# Patient Record
Sex: Male | Born: 1955 | Race: Black or African American | Hispanic: No | Marital: Single | Smoking: Current every day smoker
Health system: Southern US, Community
[De-identification: ages and names within clinical notes are randomized; demographics above are authoritative.]

## PROBLEM LIST (undated history)

## (undated) DIAGNOSIS — F259 Schizoaffective disorder, unspecified: Secondary | ICD-10-CM

## (undated) DIAGNOSIS — I10 Essential (primary) hypertension: Secondary | ICD-10-CM

## (undated) DIAGNOSIS — F419 Anxiety disorder, unspecified: Secondary | ICD-10-CM

## (undated) DIAGNOSIS — K5641 Fecal impaction: Secondary | ICD-10-CM

## (undated) DIAGNOSIS — F250 Schizoaffective disorder, bipolar type: Secondary | ICD-10-CM

## (undated) DIAGNOSIS — J189 Pneumonia, unspecified organism: Secondary | ICD-10-CM

## (undated) DIAGNOSIS — K5900 Constipation, unspecified: Secondary | ICD-10-CM

## (undated) DIAGNOSIS — J449 Chronic obstructive pulmonary disease, unspecified: Secondary | ICD-10-CM

---

## 1997-05-24 ENCOUNTER — Other Ambulatory Visit: Admission: RE | Admit: 1997-05-24 | Discharge: 1997-05-24 | Payer: Self-pay

## 2002-02-07 ENCOUNTER — Encounter: Payer: Self-pay | Admitting: Emergency Medicine

## 2002-02-07 ENCOUNTER — Inpatient Hospital Stay (HOSPITAL_COMMUNITY): Admission: EM | Admit: 2002-02-07 | Discharge: 2002-02-09 | Payer: Self-pay | Admitting: Emergency Medicine

## 2002-02-08 ENCOUNTER — Encounter: Payer: Self-pay | Admitting: General Surgery

## 2002-02-09 ENCOUNTER — Encounter: Payer: Self-pay | Admitting: General Surgery

## 2003-07-20 ENCOUNTER — Emergency Department (HOSPITAL_COMMUNITY): Admission: EM | Admit: 2003-07-20 | Discharge: 2003-07-20 | Payer: Self-pay | Admitting: Emergency Medicine

## 2003-07-25 ENCOUNTER — Emergency Department (HOSPITAL_COMMUNITY): Admission: EM | Admit: 2003-07-25 | Discharge: 2003-07-25 | Payer: Self-pay | Admitting: Emergency Medicine

## 2003-07-31 ENCOUNTER — Emergency Department (HOSPITAL_COMMUNITY): Admission: EM | Admit: 2003-07-31 | Discharge: 2003-07-31 | Payer: Self-pay | Admitting: Emergency Medicine

## 2003-08-02 ENCOUNTER — Emergency Department (HOSPITAL_COMMUNITY): Admission: EM | Admit: 2003-08-02 | Discharge: 2003-08-02 | Payer: Self-pay | Admitting: Emergency Medicine

## 2003-08-07 ENCOUNTER — Emergency Department (HOSPITAL_COMMUNITY): Admission: EM | Admit: 2003-08-07 | Discharge: 2003-08-07 | Payer: Self-pay | Admitting: Emergency Medicine

## 2003-09-12 ENCOUNTER — Emergency Department (HOSPITAL_COMMUNITY): Admission: EM | Admit: 2003-09-12 | Discharge: 2003-09-12 | Payer: Self-pay | Admitting: Emergency Medicine

## 2003-09-23 ENCOUNTER — Emergency Department (HOSPITAL_COMMUNITY): Admission: EM | Admit: 2003-09-23 | Discharge: 2003-09-23 | Payer: Self-pay | Admitting: Emergency Medicine

## 2003-11-08 ENCOUNTER — Emergency Department (HOSPITAL_COMMUNITY): Admission: EM | Admit: 2003-11-08 | Discharge: 2003-11-08 | Payer: Self-pay | Admitting: Emergency Medicine

## 2004-01-20 ENCOUNTER — Emergency Department (HOSPITAL_COMMUNITY): Admission: EM | Admit: 2004-01-20 | Discharge: 2004-01-21 | Payer: Self-pay | Admitting: Emergency Medicine

## 2004-01-30 ENCOUNTER — Ambulatory Visit: Payer: Self-pay | Admitting: Family Medicine

## 2004-01-30 ENCOUNTER — Inpatient Hospital Stay (HOSPITAL_COMMUNITY): Admission: EM | Admit: 2004-01-30 | Discharge: 2004-02-06 | Payer: Self-pay | Admitting: Emergency Medicine

## 2004-11-20 ENCOUNTER — Emergency Department (HOSPITAL_COMMUNITY): Admission: EM | Admit: 2004-11-20 | Discharge: 2004-11-21 | Payer: Self-pay | Admitting: Emergency Medicine

## 2005-03-19 ENCOUNTER — Ambulatory Visit: Payer: Self-pay | Admitting: Internal Medicine

## 2005-03-19 ENCOUNTER — Inpatient Hospital Stay (HOSPITAL_COMMUNITY): Admission: EM | Admit: 2005-03-19 | Discharge: 2005-03-23 | Payer: Self-pay | Admitting: Emergency Medicine

## 2005-04-04 ENCOUNTER — Emergency Department (HOSPITAL_COMMUNITY): Admission: EM | Admit: 2005-04-04 | Discharge: 2005-04-05 | Payer: Self-pay | Admitting: Emergency Medicine

## 2005-04-05 ENCOUNTER — Emergency Department (HOSPITAL_COMMUNITY): Admission: EM | Admit: 2005-04-05 | Discharge: 2005-04-05 | Payer: Self-pay | Admitting: Emergency Medicine

## 2005-05-13 ENCOUNTER — Encounter: Payer: Self-pay | Admitting: Emergency Medicine

## 2005-05-13 ENCOUNTER — Inpatient Hospital Stay (HOSPITAL_COMMUNITY): Admission: EM | Admit: 2005-05-13 | Discharge: 2005-05-21 | Payer: Self-pay | Admitting: Psychiatry

## 2005-05-14 ENCOUNTER — Ambulatory Visit: Payer: Self-pay | Admitting: Psychiatry

## 2005-09-16 ENCOUNTER — Emergency Department (HOSPITAL_COMMUNITY): Admission: EM | Admit: 2005-09-16 | Discharge: 2005-09-16 | Payer: Self-pay | Admitting: Emergency Medicine

## 2005-10-16 ENCOUNTER — Emergency Department (HOSPITAL_COMMUNITY): Admission: EM | Admit: 2005-10-16 | Discharge: 2005-10-16 | Payer: Self-pay | Admitting: *Deleted

## 2007-04-09 ENCOUNTER — Emergency Department (HOSPITAL_COMMUNITY): Admission: EM | Admit: 2007-04-09 | Discharge: 2007-04-09 | Payer: Self-pay | Admitting: Emergency Medicine

## 2007-11-20 ENCOUNTER — Emergency Department (HOSPITAL_COMMUNITY): Admission: EM | Admit: 2007-11-20 | Discharge: 2007-11-21 | Payer: Self-pay | Admitting: Emergency Medicine

## 2008-09-23 ENCOUNTER — Emergency Department (HOSPITAL_COMMUNITY): Admission: EM | Admit: 2008-09-23 | Discharge: 2008-09-23 | Payer: Self-pay | Admitting: Emergency Medicine

## 2009-04-18 ENCOUNTER — Emergency Department (HOSPITAL_COMMUNITY): Admission: EM | Admit: 2009-04-18 | Discharge: 2009-04-18 | Payer: Self-pay | Admitting: Emergency Medicine

## 2009-05-23 DEATH — deceased

## 2009-06-09 ENCOUNTER — Inpatient Hospital Stay (HOSPITAL_COMMUNITY): Admission: EM | Admit: 2009-06-09 | Discharge: 2009-06-13 | Payer: Self-pay | Admitting: Emergency Medicine

## 2009-06-11 ENCOUNTER — Ambulatory Visit: Payer: Self-pay | Admitting: Vascular Surgery

## 2009-06-11 ENCOUNTER — Encounter (INDEPENDENT_AMBULATORY_CARE_PROVIDER_SITE_OTHER): Payer: Self-pay | Admitting: Internal Medicine

## 2009-06-11 ENCOUNTER — Ambulatory Visit: Payer: Self-pay | Admitting: Cardiology

## 2010-02-08 ENCOUNTER — Emergency Department (HOSPITAL_COMMUNITY)
Admission: EM | Admit: 2010-02-08 | Discharge: 2010-02-08 | Payer: Self-pay | Source: Home / Self Care | Admitting: Emergency Medicine

## 2010-05-04 LAB — GLUCOSE, CAPILLARY: Glucose-Capillary: 137 mg/dL — ABNORMAL HIGH (ref 70–99)

## 2010-05-12 ENCOUNTER — Emergency Department (HOSPITAL_COMMUNITY)
Admission: EM | Admit: 2010-05-12 | Discharge: 2010-05-13 | Disposition: A | Discharge status: Home / Self Care | Payer: Medicaid Other | Attending: Emergency Medicine | Admitting: Emergency Medicine

## 2010-05-12 ENCOUNTER — Emergency Department (HOSPITAL_COMMUNITY): Payer: Medicaid Other

## 2010-05-12 DIAGNOSIS — R059 Cough, unspecified: Secondary | ICD-10-CM | POA: Insufficient documentation

## 2010-05-12 DIAGNOSIS — E119 Type 2 diabetes mellitus without complications: Secondary | ICD-10-CM | POA: Insufficient documentation

## 2010-05-12 DIAGNOSIS — J449 Chronic obstructive pulmonary disease, unspecified: Secondary | ICD-10-CM | POA: Insufficient documentation

## 2010-05-12 DIAGNOSIS — J029 Acute pharyngitis, unspecified: Secondary | ICD-10-CM | POA: Insufficient documentation

## 2010-05-12 DIAGNOSIS — I10 Essential (primary) hypertension: Secondary | ICD-10-CM | POA: Insufficient documentation

## 2010-05-12 DIAGNOSIS — J40 Bronchitis, not specified as acute or chronic: Secondary | ICD-10-CM | POA: Insufficient documentation

## 2010-05-12 DIAGNOSIS — R05 Cough: Secondary | ICD-10-CM | POA: Insufficient documentation

## 2010-05-12 DIAGNOSIS — E785 Hyperlipidemia, unspecified: Secondary | ICD-10-CM | POA: Insufficient documentation

## 2010-05-12 DIAGNOSIS — J4489 Other specified chronic obstructive pulmonary disease: Secondary | ICD-10-CM | POA: Insufficient documentation

## 2010-05-12 LAB — GLUCOSE, CAPILLARY
Glucose-Capillary: 129 mg/dL — ABNORMAL HIGH (ref 70–99)
Glucose-Capillary: 131 mg/dL — ABNORMAL HIGH (ref 70–99)
Glucose-Capillary: 132 mg/dL — ABNORMAL HIGH (ref 70–99)
Glucose-Capillary: 163 mg/dL — ABNORMAL HIGH (ref 70–99)
Glucose-Capillary: 181 mg/dL — ABNORMAL HIGH (ref 70–99)
Glucose-Capillary: 85 mg/dL (ref 70–99)
Glucose-Capillary: 93 mg/dL (ref 70–99)
Glucose-Capillary: 98 mg/dL (ref 70–99)

## 2010-05-12 LAB — BASIC METABOLIC PANEL
BUN: 8 mg/dL (ref 6–23)
CO2: 28 mEq/L (ref 19–32)
CO2: 31 mEq/L (ref 19–32)
Calcium: 8.9 mg/dL (ref 8.4–10.5)
Chloride: 97 mEq/L (ref 96–112)
Creatinine, Ser: 0.78 mg/dL (ref 0.4–1.5)
Creatinine, Ser: 0.8 mg/dL (ref 0.4–1.5)
GFR calc Af Amer: >60 mL/min (ref >60)
GFR calc Af Amer: >60 mL/min (ref >60)
GFR calc non Af Amer: >60 mL/min (ref >60)
GFR calc non Af Amer: >60 mL/min (ref >60)
Glucose, Bld: 123 mg/dL — ABNORMAL HIGH (ref 70–99)
Sodium: 135 mEq/L (ref 135–145)
Sodium: 139 mEq/L (ref 135–145)

## 2010-05-12 LAB — COMPREHENSIVE METABOLIC PANEL
AST: 21 U/L (ref 0–37)
CO2: 31 mEq/L (ref 19–32)
Calcium: 9 mg/dL (ref 8.4–10.5)
Chloride: 103 mEq/L (ref 96–112)
Creatinine, Ser: 0.65 mg/dL (ref 0.4–1.5)
GFR calc Af Amer: >60 mL/min (ref >60)
GFR calc non Af Amer: >60 mL/min (ref >60)
Glucose, Bld: 103 mg/dL — ABNORMAL HIGH (ref 70–99)
Total Bilirubin: 0.9 mg/dL (ref 0.3–1.2)

## 2010-05-12 LAB — DIFFERENTIAL
Basophils Absolute: 0 10*3/uL (ref 0.0–0.1)
Basophils Absolute: 0 10*3/uL (ref 0.0–0.1)
Basophils Relative: 0 % (ref 0–1)
Eosinophils Absolute: 0 10*3/uL (ref 0.0–0.7)
Lymphocytes Relative: 21 % (ref 12–46)
Lymphs Abs: 2.3 10*3/uL (ref 0.7–4.0)
Monocytes Absolute: 1.3 10*3/uL — ABNORMAL HIGH (ref 0.1–1.0)
Neutro Abs: 7.9 10*3/uL — ABNORMAL HIGH (ref 1.7–7.7)
Neutrophils Relative %: 72 % (ref 43–77)

## 2010-05-12 LAB — RETICULOCYTES
Retic Count, Absolute: 37.4 10*3/uL (ref 19.0–186.0)
Retic Ct Pct: 0.9 % (ref 0.4–3.1)

## 2010-05-12 LAB — CBC
HCT: 33.2 % — ABNORMAL LOW (ref 39.0–52.0)
HCT: 38.4 % — ABNORMAL LOW (ref 39.0–52.0)
Hemoglobin: 10.6 g/dL — ABNORMAL LOW (ref 13.0–17.0)
Hemoglobin: 12.5 g/dL — ABNORMAL LOW (ref 13.0–17.0)
MCHC: 31.9 g/dL (ref 30.0–36.0)
MCV: 86.5 fL (ref 78.0–100.0)
MCV: 86.8 fL (ref 78.0–100.0)
Platelets: 258 10*3/uL (ref 150–400)
RBC: 3.83 MIL/uL — ABNORMAL LOW (ref 4.22–5.81)
RBC: 4.05 MIL/uL — ABNORMAL LOW (ref 4.22–5.81)
RBC: 4.41 MIL/uL (ref 4.22–5.81)
RDW: 13.2 % (ref 11.5–15.5)
WBC: 11 10*3/uL — ABNORMAL HIGH (ref 4.0–10.5)
WBC: 11.2 10*3/uL — ABNORMAL HIGH (ref 4.0–10.5)
WBC: 11.4 10*3/uL — ABNORMAL HIGH (ref 4.0–10.5)

## 2010-05-12 LAB — URINALYSIS, ROUTINE W REFLEX MICROSCOPIC
Bilirubin Urine: NEGATIVE
Glucose, UA: NEGATIVE mg/dL
Hgb urine dipstick: NEGATIVE
Ketones, ur: 15 mg/dL — AB
Specific Gravity, Urine: 1.015 (ref 1.005–1.030)
pH: 6 (ref 5.0–8.0)

## 2010-05-12 LAB — HEMOGLOBIN A1C: Hgb A1c MFr Bld: 6 % — ABNORMAL HIGH (ref <5.7)

## 2010-05-12 LAB — URINE CULTURE: Special Requests: NEGATIVE

## 2010-05-12 LAB — BRAIN NATRIURETIC PEPTIDE: Pro B Natriuretic peptide (BNP): <30 pg/mL (ref 0.0–100.0)

## 2010-05-12 LAB — TSH: TSH: 0.464 u[IU]/mL (ref 0.350–4.500)

## 2010-05-12 LAB — IRON AND TIBC
Iron: 23 ug/dL — ABNORMAL LOW (ref 42–135)
TIBC: 216 ug/dL (ref 215–435)

## 2010-05-12 LAB — FOLATE: Folate: 14.6 ng/mL

## 2010-05-12 LAB — AMMONIA: Ammonia: 27 umol/L (ref 11–35)

## 2010-05-12 LAB — POCT CARDIAC MARKERS
CKMB, poc: <1 ng/mL — ABNORMAL LOW (ref 1.0–8.0)
Troponin i, poc: <0.05 ng/mL (ref 0.00–0.09)

## 2010-05-12 LAB — FERRITIN: Ferritin: 322 ng/mL (ref 22–322)

## 2010-05-12 LAB — VITAMIN B12: Vitamin B-12: 376 pg/mL (ref 211–911)

## 2010-05-13 LAB — URINALYSIS, ROUTINE W REFLEX MICROSCOPIC
Nitrite: NEGATIVE
Protein, ur: NEGATIVE mg/dL
Urobilinogen, UA: 1 mg/dL (ref 0.0–1.0)

## 2010-05-13 LAB — COMPREHENSIVE METABOLIC PANEL
Alkaline Phosphatase: 71 U/L (ref 39–117)
BUN: 16 mg/dL (ref 6–23)
Glucose, Bld: 134 mg/dL — ABNORMAL HIGH (ref 70–99)
Potassium: 3.7 mEq/L (ref 3.5–5.1)
Total Bilirubin: 0.4 mg/dL (ref 0.3–1.2)
Total Protein: 7.3 g/dL (ref 6.0–8.3)

## 2010-05-13 LAB — BASIC METABOLIC PANEL
CO2: 29 mEq/L (ref 19–32)
Chloride: 105 mEq/L (ref 96–112)
GFR calc non Af Amer: >60 mL/min (ref >60)
Glucose, Bld: 108 mg/dL — ABNORMAL HIGH (ref 70–99)
Potassium: 3.3 mEq/L — ABNORMAL LOW (ref 3.5–5.1)
Sodium: 142 mEq/L (ref 135–145)

## 2010-05-13 LAB — URINE CULTURE

## 2010-05-13 LAB — DIFFERENTIAL
Basophils Absolute: 0 10*3/uL (ref 0.0–0.1)
Basophils Relative: 0 % (ref 0–1)
Neutro Abs: 7.6 10*3/uL (ref 1.7–7.7)
Neutrophils Relative %: 79 % — ABNORMAL HIGH (ref 43–77)

## 2010-05-13 LAB — CBC
HCT: 38.6 % — ABNORMAL LOW (ref 39.0–52.0)
Hemoglobin: 12.6 g/dL — ABNORMAL LOW (ref 13.0–17.0)
MCV: 87.3 fL (ref 78.0–100.0)
RDW: 13.4 % (ref 11.5–15.5)

## 2010-05-30 LAB — DIFFERENTIAL
Basophils Absolute: 0 10*3/uL (ref 0.0–0.1)
Basophils Relative: 0 % (ref 0–1)
Eosinophils Relative: 0 % (ref 0–5)
Lymphocytes Relative: 27 % (ref 12–46)
Monocytes Absolute: 0.8 10*3/uL (ref 0.1–1.0)

## 2010-05-30 LAB — CBC
HCT: 41.8 % (ref 39.0–52.0)
Hemoglobin: 13.5 g/dL (ref 13.0–17.0)
MCHC: 32.3 g/dL (ref 30.0–36.0)
RDW: 13.9 % (ref 11.5–15.5)

## 2010-05-30 LAB — POCT I-STAT, CHEM 8
BUN: 19 mg/dL (ref 6–23)
Calcium, Ion: 1.18 mmol/L (ref 1.12–1.32)
TCO2: 26 mmol/L (ref 0–100)

## 2010-07-10 NOTE — Discharge Summary (Signed)
Craig Bentley, Craig Bentley NO.:  [id number]   MEDICAL RECORD NO.:  [id number]          PATIENT TYPE:  INP   LOCATION:  5743                         FACILITY:  [facility]   PHYSICIAN:  Duncan Dull, M.D.     DATE OF BIRTH:  08-16-1955   DATE OF PROCEDURE:  DATE OF DISCHARGE:  03/24/2005                      STAT - MUST CHANGE TO CORRECT WORK TYPE   DISCHARGE DIAGNOSES:  1.  Ileus/fecal impaction.  2.  Bilateral patchy upper lobe airspace disease and right lower lobe      consolidation.  3.  Acute renal failure.  4.  Hypotension.  5.  Urinary retention.  6.  Hypomagnesemia.  7.  Schizophrenia.   DISCHARGE MEDICATIONS:  1.  Metformin 500 mg p.o. q.12h.  2.  Clozapine 100 mg p.o. q.a.m., clozapine 200 mg p.o. at noon, clozapine      400 mg p.o. q.h.s.  3.  Allegra 180 mg one tablet p.o. daily.  4.  Mobic 15 mg one tablet p.o. daily.  5.  Lactulose 15 mg p.o. daily.  6.  Aspirin 325 mg p.o. daily.  7.  Niaspan 1500 mg p.o. q.h.s.  8.  Albuterol MDI q.6h. p.r.n.  9.  Benztropine 2 mg p.o. q.12h.  10. Spiriva 18 mcg one capsule inhaled daily.  11. Zelnorm 6 mg p.o. b.i.d. before meals.  12. Magnesium oxide 400 mg p.o. t.i.d.  13. Avelox 400 mg one tablet p.o. daily, with the last dose to be given on      March 28, 2005.  14. Haldol 5 mg p.o. q.8h. to be started on April 01, 2005.   CONDITION ON DISCHARGE:  The patient will be discharged to [facility].  He is much improved at the time of discharge, and his  fecal impaction has resolved.   FOLLOW UP:  He should follow up with Dr. Hortencia Pilar, [facility] at his previously scheduled appointment on April 01, 2005.  At this  time, some consideration should be given to altering his psychotropic  medications, as there was felt to be some contribution to his fecal  impaction as well as to his urinary retention by the anticholinergics  prescribed.  He should also be seen by the  physician that staffs [facility] within one week of the time of discharge.  Specific  attention should be paid to his bowel movements per day, his blood pressure,  his potassium and his magnesium, as these have all been issues during his  hospitalization.  He should also have a repeat chest x-ray in one month to  follow progression of his abnormal findings as detailed below.   PROCEDURES:  There were no invasive procedures.   STUDIES:  1.  Chest x-ray done on March 19, 2005 showed a previously demonstrated      right lung airspace opacity suspicious for pneumonia.  2.  A chest CT done on March 19, 2005 showed right lower lobe      collapse/consolidation with soft tissue density in the right infrahilar      region.  No  mass identified.  3.  A CT of the pelvis on March 19, 2005 showed marked abdominal      distension with very large volume stool in the abdominal segments of the      colon.  It also showed a small amount of free fluid at the tip of the      liver as well as a small volume of fluid in the peritoneal cavity, with      the colon markedly distended with stool.  4.  An abdominal plain film done on March 09, 2005 showed a large amount      of stool in the colon on the left in the sigmoid region.  It also showed      stable minimal chronic bronchitic changes as well as interval diffuse      right lung volume loss and associated postobstructive changes or      pneumonia.  5.  An abdominal film done on March 20, 2005 showed progressive      constipation with massive amount of fecal material present throughout      the colon.  6.  A chest CT with contrast done on March 21, 2005 showed bilateral      patchy upper lobe airspace disease as well as left basilar atelectasis      and slight improvement of his right lower lobe consolidation.  There      were also noted to be small bilateral effusions.  There is no      obstructing central mass  identified.  There is also a new 1.4-cm fluid      collection adjacent to the spleen as well as stable colonic distension.  7.  An abdominal plain film done on March 21, 2005 showed interval      decrease in volume of the retained stool in the colon.   CONSULTATIONS:  Psychiatry was consulted, Dr. Jeanie Sewer.   HISTORY OF PRESENT ILLNESS:  For full details, please see the full history  and physical on the chart.  In brief, Craig Bentley is a 55 year old African-  American gentleman with a history of schizophrenia, hypertension, and past  episodes of partial small-bowel obstruction.  He presents with a two-day  history of generalized abdominal pain associated with nausea and decreased  p.o. intake.  He vomited several times consisting of brown feculent  material.  He denies any hematochezia or hematemesis.  He does note that he  has been constipated for greater than one week but did have one single  watery bowel movement the day prior to admission.  He denies any fevers or  chills.  He also noted that he had a chronic cough productive of yellow  sputum and chronic dyspnea on exertion.  He also reports chronically that he  suffers from urinary hesitancy, poor stream, and postmicturition dribble.   PHYSICAL EXAMINATION:  VITAL SIGNS:  Temperature 97.2, blood pressure 56/34  which improved to 94/44 after 2 L of fluid.  His oxygen saturation was 95%  on 2 L.  HEENT:  Poor oral hygiene and feculent breath.  RESPIRATORY:  Significant for coarse breath sounds bilaterally with an  increased expiratory phase anteriorly.  He also had poor breath sounds  bibasilarly, with right greater than left.  ABDOMEN:  Soft, distended, and moderately tender diffusely.  He was guaiac-  positive.   LABORATORY DATA ON ADMISSION:  White blood cell count 13.1, hemoglobin 13.9,  hematocrit 41.5, platelets 217.  His basic metabolic panel was significant  for a creatinine of 1.6.  His urinalysis was negative for any  abnormal findings.  His amylase and lipase were normal.   HOSPITAL COURSE BY PROBLEM:  Problem 1.  Fecal impaction.  The patient came  in with a history of constipation and feculent emesis.  He was placed n.p.o.  and given several enemas.  He was followed by serial abdominal plain films,  and after several large bowel movements, some resolution of his constipation  was achieved.  His diet was advanced as tolerated.  He was started on  Zelnorm to see if this would yield some improvement his chronic  constipation.  His lactulose was resumed for its osmotic effect.  At the  time of discharge, he does report that his stools are soft but formed.  Colace may be restarted as needed for hard stools.  At the time of  discharge, he is tolerating a full diet and having one to two bowel  movements daily.  There was some question of whether the anticholinergic  effect of his psychotropic medication could be contributing to his recurrent  fecal impaction.  Please see below.   Problem 2.  Consolidations seen on chest x-ray.  The patient reported some  chronic dyspnea on exertion and chronic productive cough.  He is a long-time  smoker.  Some of this was felt to be COPD; however, he did have  abnormalities as seen on his chest x-ray with some consolidation suggestive  of pneumonia.  Given his leukocytosis, he was started on Avelox daily and  should complete a two-week course of this.  He was also started on Spiriva  for his COPD and albuterol inhaler as needed.  The CT of the chest did show  some patchy upper airspace disease as well as right lower lobe consolidation  consistent with pneumonia.  A followup chest x-ray or chest CT should be  done in one month to follow up on resolution of these abnormal findings.  His white blood cell count was normalized at the time of discharge, and from  a respiratory standpoint, his oxygen saturations were within normal limits  on room air.   Problem 3.  Acute  renal failure.  The patient came in with a creatinine of  1.6.  This was felt to be secondary to volume depletion given his  constipation and emesis and decreased p.o. intake.  This resolved rapidly  with IV fluids and creatinine was within normal limits at the time of  discharge.   Problem 4.  Extreme hypotension.  Again, this was felt to be secondary to  volume depletion.  His blood pressure resolved with IV fluids, and by  hospital day #3, he was no longer orthostatic.  His IV fluids were normal  saline locked at that time, and his blood pressure remained stable.  His  blood pressure is within normal limits at the time of discharge on no  antihypertensives.  We will hold his antihypertensives at the time of  discharge, but his blood pressure should be followed closely, as these may  need to be restarted as his blood pressure may rise again.   Problem 5.  Urinary retention.  The patient came in with complaints of urinary retention, poor stream, and postmicturition dribble.  This was  confirmed with elevated postvoid residuals of 533 cc and 223 cc on two  separate occasions.  He was noted to have a large prostate on exam, and some  of this may be due to benign prostatic  hypertrophy.  There were no nodules  felt.  There is also the question, again, of whether his anticholinergics  could be contributing.  Any effort to change his psychotropic medications  should be initiated at his followup appointment with Dr. Hortencia Pilar on  April 01, 2005.  His physician at [facility] may also consider starting  him on some kind of medication for BPH.   Problem 6.  Hypomagnesemia.  The patient repeatedly had low magnesium levels  which are felt to contribute to his fecal impaction/ileus.  His magnesium  was repleted IV, and he was sent out on p.o. magnesium.  This should be  followed up in the future.   Problem 7.  Schizophrenia.  This is a longterm chronic problem for the  patient.  Psychiatry was  consulted, and at baseline, he does have auditory  hallucinations that he is able to push into the background.  Initially, he  was placed n.p.o. secondary to problem #1.  He was given a Haldol Depo shot  100 mg IM on March 19, 2005.  As soon as he was tolerating p.o., his  Clozaril and benztropine were resumed.  As the Haldol Depo shot does last  approximately two weeks, Haldol p.o. should be started on April 01, 2005.  April 01, 2005 is actually the day that he will follow up with Dr.  Hortencia Pilar at [facility].  Again, as noted, he is on Clozaril.  It  seems that he has failed several other antipsychotics.  Since his  schizophrenia is stable, no changes were made to his medication regimen.  There is no evidence of granulocytosis.  However, as some of his medications  were felt to be possibly contributory to his repeated history of  constipation and fecal impaction as well as his urinary retention, if there  are any other antipsychotropics that have not been tried, it may be possible  to switch him, and that would be preferred.  Again, he is quite stable in  terms of his schizophrenia at the time of discharge.   DISCHARGE LABORATORY DATA AND VITAL SIGNS:  Vital signs on the day of  discharge reveal a temperature of 99.5, pulse 91, respirations 20, blood  pressure 130/68.  He was 92% oxygen saturation on room air.  His CBGs ranged  from 119 to 181 on sliding scale insulin.  His last labs on the day of  discharge showed a white blood cell count of 7.5, hemoglobin 11.3,  hematocrit 33.4, platelets 187.  His BMET showed a sodium of 141,  potassium 4.1, chloride 109, bicarb 27, glucose 106, BUN 3, creatinine 0.7,  calcium 8.7, magnesium 1.9.  His magnesium was repleted with 2 g of  magnesium sulfate IV prior to discharge.  There are no pending labs at the  time of this dictation.      Inis Sizer, M.D.      Duncan Dull, M.D.  Electronically Signed   DC/MEDQ  D:   03/23/2005  T:  03/23/2005  Job:  [id number]

## 2010-07-10 NOTE — Discharge Summary (Signed)
NAMETRUMAINE, WIMER NO.:  [id number]   MEDICAL RECORD NO.:  [id number]          PATIENT TYPE:  INP   LOCATION:  5732                         FACILITY:  [facility]   PHYSICIAN:  Leighton Roach McDiarmid, M.D.DATE OF BIRTH:  07-13-1955   DATE OF ADMISSION:  01/30/2004  DATE OF DISCHARGE:  02/06/2004                                 DISCHARGE SUMMARY   ANTICIPATED DISCHARGE DATE:  February 06, 2004.   PRIMARY PHYSICIAN:  Dr. Hortencia Pilar at [facility].   CONSULTANTS:  Gabrielle Dare. Janee Morn, M.D., with surgery.   DISCHARGE DIAGNOSES:  1.  Resolved partial small bowel obstruction.  2.  Schizophrenia.  3.  Hypertension.  4.  History of constipation.  5.  Bacteremia.  6.  Dehydration, resolved.   DISCHARGE MEDICATIONS:  1.  Haldol 5 mg one by mouth three times a day.  2.  Clozaril 100 mg by mouth each morning, 200 mg by mouth at noon and 400      mg by mouth each night.  3.  Lactulose 30 mL by mouth per day.  4.  Tenormin 50 mg one tablet by mouth twice a day.  5.  Cipro 500 mg one tablet by mouth twice a day for four days.   FOLLOWUP:  The patient is to follow up with the M.D. at his facility/rest  home.  Please note that the patient is to be transferred from the assisted  living facility weekly to an outpatient laboratory for a weekly CBC, the  results of which need to be called to Dr. Hortencia Pilar at [facility].  The patient needs this to be completed because of the risk of  agranulocytosis with Clozaril.   PROCEDURES AND DIAGNOSTIC STUDIES:  1.  Electrocardiogram.  2.  Acute abdominal series showing bilateral basilar atelectasis and      scarring worse than on the previous study.  Motion artifact prevented      complete exclusion of small bowel free air.  There was some distention      of both large and small bowel with some gases seen to the rectum and the      sigmoid.  3.  CT of abdomen with fluid-filled dilated small bowel loops in  the abdomen      measuring up to 4.4 cm in diameter, multiple adhesions apparent in the      central abdomen, no evidence of intraperitoneal free air and no      interlooped fluid identified.  4.  CT of pelvis showed distal small bowel loops were less distended that      those more proximal loops seen in the abdomen with fairly decompressed      distal and terminal ileum, although there is air, stool and fluid      throughout the course of the colon.  Trace amounts of free fluid was      identified in the peritoneal cavity.  Features could be compatible with      a partial small bowel obstruction or early complete small bowel  obstruction given the multiple adhesions seen in the abdomen.  There was      no evidence of bowel wall thickening, pneumatosis or interlooped fluid.   HISTORY OF PRESENT ILLNESS:  The patient is a 55 year old African American  male with a history of schizophrenia, who lives in an assisted living  facility.  Brought to the ER by EMS following a call for pain.  He is signed  up for the nursing home.  He had reportedly been vomiting for the last two  days.  Paramedics noted abdominal distention and no palpable masses.  The  patient reported not having a bowel movement for three days.  He has a  history of constipation and admitted in 2003 for fecal impaction that  resolved with enemas and MiraLax.  The patient also reported difficulty  urinating and having very dark urine.   PHYSICAL EXAMINATION:  VITAL SIGNS:  Notable for a temperature of 101.2  degrees with a heart rate ranging from 83-120 with stable blood pressure.  ABDOMEN:  Noted to be soft with a full abdomen that was mildly distended.  No appreciable bowel sounds.  The bladder was not palpated, but the patient  does have a Foley placed.  RECTAL:  Large, smooth prostate.  It feels as if there is massive stool 6-8  cm into the rectum, guaiac negative.   LABORATORY INTAKE DATA:  EKG showing sinus  tachycardia with abdominal x-ray  with findings listed above.  White count 8.1, hemoglobin 14.4, platelets  311,000.  Creatinine 2.5, BUN 58.  AST and ALT within normal limits.  Lipase  15.  UA notable for ketones of 15, negative protein, positive nitrite and LE  and many bacteria.   HOSPITAL COURSE:  The patient was admitted for observation and management of  his small bowel obstruction.   #1 - SMALL BOWEL OBSTRUCTION:  The patient was admitted and placed on IV  fluids and IV antibiotics.  An NG tube was placed with intermittent wall  suction and the patient was made NPO.  Notably the patient did not have  signs nor symptoms of an acute abdomen.  His belly was soft with no  peritoneal signs.  Surgery was consulted in case the patient would need to  be taken to the operating room for emergent intervention.  However, the  patient never developed signs nor symptoms of an acute abdomen.  The patient  gradually had a return of bowel sounds.  He had significant output.  After  the patient began passing stool and flatus, his NG tube was clamped.  The  patient was able to tolerate this.  It was advanced to sips of clears.  The  patient again tolerated this and was advanced to clear liquids and then full  liquids again without difficulty.  The NG tube was removed.  At the time of  dictation, the patient was tolerating a p.o. diet that had been fully  advanced without difficulty.  At the time of discharge, the patient was  started back on his Lactulose 30 mL p.o. daily to treat his history of  constipation.   #2 - HISTORY OF SCHIZOPHRENIA:  The patient has a history of schizophrenia.  He had been treated with p.o. Haldol, Clozaril and Cogentin.  On intake, the  patient's oral medications had to be discontinued because he was not taking  anything by mouth.  The patient was treated with IV Haldol.  The patient states that he always has chronic hallucinations with images  of people in  the room.   This continued during this hospitalization, however, the patient  stated that they were not above baseline.  The patient was not anxious and  not agitated during this hospitalization.  After the patient began  tolerating p.o., he was switched back to p.o. Haldol.  The patient's  Clozaril was also restarted.  The patient had arrangements made through  social work to have CBCs monitored each week with the results called to his  primary psychiatrist, Dr. Hortencia Pilar at [facility].  Notably, the  Cogentin was not restarted because of its anticholinergic  properties.  Restarting this medication will be left up to the primary care  Aasir Daigler and the patient.  The risks of restarting include the likelihood of  increasing constipation.  Benefits include the likely decreased risk of  extrapyramidal symptoms.   #3 - BACTEREMIA:  The patient initially presented with a fever.  One out of  two blood cultures was positive for Escherichia coli sensitive to Cipro and  resistant only to ampicillin and ampicillin sulbactam.  The patient was  medicated with IV Cipro, defervesced and remained stable.  The patient was  switched to p.o. Cipro as soon as he was tolerating p.o. intake.  The actual  source of the infection is not clear.  The patient did not have symptoms  consistent with prostatitis.  The patient may have possibly had a  microperforation, although he did not have any peritoneal signs at any time  during this hospitalization.  The patient may have had transient bacteremia  with enteric pathogens because of his small bowel obstruction.  By the time  of discharge, the patient was afebrile and stable.  To be continued on Cipro  for four more days.   #4 - DEHYDRATION:  The patient initially presented with an elevated serum  creatinine.  This resolved with dehydration to a nadir of 0.8 on February 02, 2004.  It is likely the patient was prerenal and intervascularly  depleted before  hospitalization.  At the time of discharge, the patient had  no signs nor symptoms of dehydration and had adequate urine output.  The  patient's urinary symptoms may have been related to dehydration and  decreased urine output.  A urine culture was collected which showed only  coagulase-negative staphylococcus species.  This was most likely a  contaminant.   DISCHARGE LABORATORIES:  Metabolic panel on February 04, 2004, within normal  limits with the exception of sodium 134, glucose 148, BUN 5.  Urine culture  with coagulase-negative staphylococcus.  One of two blood cultures positive  for Escherichia coli.  Liver enzymes notable for a total bilirubin of 1.3,  total protein 5.8, albumin 2.5 and calcium 7.9.  TSH 0.511.  Hemoglobin A1C  5.8.       GSD/MEDQ  D:  02/05/2004  T:  02/05/2004  Job:  [id number]   cc:   Dr. Alwyn Ren [facility]   Gabrielle Dare. Janee Morn, M.D. [facility]  [address] [place], [place] [address]  Fax: [phone]

## 2010-07-10 NOTE — Discharge Summary (Signed)
Craig Bentley, HAZEL NO.:  [id number]   MEDICAL RECORD NO.:  [id number]                   PATIENT TYPE:  INP   LOCATION:  5501                                 FACILITY:  [facility]   PHYSICIAN:  Sharlet Salina T. Hoxworth, M.D.          DATE OF BIRTH:  Nov 18, 1955   DATE OF ADMISSION:  02/07/2002  DATE OF DISCHARGE:  02/09/2002                                 DISCHARGE SUMMARY   DISCHARGE DIAGNOSIS:  Fecal impaction.   OPERATION/PROCEDURE:  None.   HISTORY OF PRESENT ILLNESS:  The patient is a 55 year old black male with  schizophrenia resident of [facility] who presented to the  emergency room with a two to three day history of increasing abdominal pain  and distention. He complains of mid abdominal pain. No nausea and vomiting.  He states he had a bowel movement yesterday. Denies a history of similar  symptoms.   PAST MEDICAL HISTORY:  He is treated for chronic, undifferentiated  schizophrenia and hypertension. Denies abdominal surgery.   MEDICATIONS:  Tenormin 25 mg daily, Lactulose daily, Cogentin 2 mg daily,  Haldol 5 mg daily, Clozaril 100 mg a day, Robitussin and Phenergan p.r.n.,  and Symmetrel 100 mg a day.   ALLERGIES:  No known allergies.   PHYSICAL EXAMINATION:   VITAL SIGNS:  He is afebrile. Pulse 100, vital signs within normal limits.   GENERAL:  He is a well-developed, black male with decreased affect in no  acute distress.   ABDOMEN:  Pertinent findings were limited to the abdomen, which was very  distended, nontender, without mass.   LABORATORY DATA:  All unremarkable with a mildly elevated white count of  12,000. Flat and upright abdominal x-ray showed distended small bowel loops  and some distention of the sigmoid colon as well with a huge amount of stool  throughout the transverse colon.   HOSPITAL COURSE:  The patient was admitted with suspected fecal impaction  versus early small-bowel obstruction. The  patient was treated with MiraLax  laxative as well as soap suds enemas. On 12/18 he was improved with some  results from the enema and KUB showed decreased small bowel distention, but  still a large amount of stool. At this point he was treated with more  aggressive cathartics with CoLyte p.o. with good results. By 12/19 his x-ray  was much improved with some stool still in the sigmoid colon, but no small  bowel distention and the stool had moved out of the transverse colon. His  abdomen is softer, less distended and nontender and he is tolerating liquids  well. He is felt ready for discharge on 02/09/2002.   DISCHARGE MEDICATIONS:  These are the same as admission plus he will be  given a prescription for MiraLax 17 g (one capful) twice daily for the next  seven days and then on a p.r.n. basis.    FINAL DIAGNOSIS:  Fecal  impaction.   FOLLOW UP:  P.R.N.                                               Lorne Skeens. Hoxworth, M.D.    Tory Emerald  D:  02/09/2002  T:  02/09/2002  Job:  [id number]

## 2010-07-10 NOTE — Discharge Summary (Signed)
NAMETIMOTHY, TRUDELL NO.:  [id number]   MEDICAL RECORD NO.:  [id number]          PATIENT TYPE:  IPS   LOCATION:  0406                          FACILITY:  [facility]   PHYSICIAN:  Anselm Jungling, MD  DATE OF BIRTH:  [birth year]   DATE OF ADMISSION:  05/13/2005  DATE OF DISCHARGE:  05/21/2005                                 DISCHARGE SUMMARY   IDENTIFYING DATA AND REASON FOR ADMISSION:  The patient is a 55 year old  single African-American male with a history of schizophrenia, who was living  at the [facility] assisted living facility.  He is a patient of Dr. Hortencia Pilar  at [facility] mental health.  He came to [place] on a regimen of Clozaril.  He was admitted due to increasing symptoms of psychosis, including auditory  hallucinations telling him to kill himself.  Please refer to the admission  note for further details pertaining to the symptoms, circumstances and  history that led to his hospitalization.  He was given an initial Axis I  diagnosis of schizophrenia, chronic paranoid type with acute exacerbation.   MEDICAL AND LABORATORY:  The patient was medically and physically evaluated  by the psychiatric nurse practitioner.  He came to [place] with a history of type  2 diabetes mellitus, taking Glucophage 500 mg b.i.d., and hypertension  taking lisinopril 10 mg daily.  He was also taking Zelnorm 6 mg b.i.d. and  magnesium oxide 400 t.i.d..  There were no significant medical issues during  his brief inpatient psychiatric stay.   HOSPITAL COURSE:  The patient was admitted to the adult inpatient  psychiatric service where he participated in various therapeutic groups and  activities.  He presented as a normally developed, well-nourished African-  American male who was generally pleasant and affable.  He denied suicidal  ideation throughout his inpatient stay and was cooperative and non  impulsive.  He did admit to auditory hallucinations.  He was quite  delusional  initially, with hyper religious verbal content and numerous  references to a baby child.  He was not noted to have any abnormal  involuntary movements.   The patient was continued on Clozaril at a dose of 100 mg b.i.d. and 400 mg  q.h.s..  He was also given Benadryl 25 mg b.i.d., Haldol 5 mg q.a.m. and 10  mg q.h.s..  A Clozaril level prior to discharge was in the therapeutic  range, measured at  660.   At the time of discharge, the patient was much better compensated, no longer  delusional.  His thoughts and statements were well organized, and he had  good insight into his illness and need to continue medication.   AFTERCARE:  The patient was to follow-up with Dr. Lang Snow at [facility]  mental health on May 25, 2005.   DISCHARGE MEDICATIONS:  Haldol 5 mg q.a.m. and 10 mg q.h.s., Clozaril 100 mg  b.i.d. an 400 mg q.h.s., Glucophage 500 mg b.i.d., lisinopril 10 mg daily,  Zelnorm 6 mg twice daily, magnesium oxide 400 mg t.i.d. and Benadryl 25 mg  b.i.d..   DISCHARGE DIAGNOSES:  AXIS I: Schizophrenia, chronic, with acute  exacerbation, resolving.  AXIS II: Deferred.  AXIS III: History of hypertension, type 2 diabetes mellitus.  AXIS IV: Stressors severe.  AXIS V: Global assessment of functioning on discharge 60.           ______________________________  Anselm Jungling, MD  Electronically Signed     SPB/MEDQ  D:  05/24/2005  T:  05/25/2005  Job:  [id number]

## 2010-07-10 NOTE — Consult Note (Signed)
NAMEKAISER, BELLUOMINI NO.:  [id number]   MEDICAL RECORD NO.:  [id number]          PATIENT TYPE:  INP   LOCATION:  5732                         FACILITY:  [facility]   PHYSICIAN:  Gabrielle Dare. Janee Morn, M.D.DATE OF BIRTH:  08/25/1955   DATE OF CONSULTATION:  02/02/2004  DATE OF DISCHARGE:                                   CONSULTATION   REASON FOR CONSULTATION:  Partial small bowel obstruction.   HISTORY OF PRESENT ILLNESS:  I was asked to evaluate this 55 year old  African-American male for a partial small bowel obstruction.  He has a  history of schizophrenia and was admitted to Dr. McDiarmid's family practice  teaching service with an ileus versus partial small bowel obstruction on  January 30, 2004.  The patient usually lives at an assisted living facility  but developed some nausea, vomiting, and abdominal pain.  The patient has  had an NG tube placed and has undergone bowel rest.  Bowel function has not  returned.  A CT scan of the abdomen and pelvis was consistent with a partial  small bowel obstruction and possible adhesions.  The patient answers  questions but is quite repetitive.  He does deny any abdominal pain and asks  when he can eat.  He did ask this several times, claiming he was hungry.   PAST MEDICAL HISTORY:  1.  Schizophrenia.  2.  Hypertension.   PAST SURGICAL HISTORY:  Apparent abdominal surgery as a child after a fall.   SOCIAL HISTORY:  He smokes about two packs a day.  He does not drink  currently.   REVIEW OF SYSTEMS:  The patient is not cooperative completely, though he  does deny chest pain and shortness of breath.   CURRENT MEDICATIONS:  Cipro, Clozaril, Haldol, and Phenergan.   PHYSICAL EXAMINATION:  VITAL SIGNS:  Temperature is 98.3, blood pressure  140/74, heart rate 85, respirations 18.  GENERAL:  He is resting comfortably.  HEENT:  Pupils are reactive.  NECK:  Supple.  CHEST:  Lungs are clear to auscultation with normal  respiratory excursion.  CARDIAC:  Regular rate and rhythm.  ABDOMEN:  Distended but soft.  There is no appreciable tenderness.  Bowel  sounds are hypoactive, and no hernias are noted.  SKIN:  Warm and dry.  EXTREMITIES:  No significant edema.   Data reviewed includes white blood cell count of 12.9, hemoglobin 9.5.  Urine cultures shows coagulase-negative staph.  Blood culture shows E. coli.  CT scan is as above.   IMPRESSION:  1.  Partial small bowel obstruction.  2.  Urinary tract infection.  3.  Bacteremia.  4.  No signs of acute abdomen at this time.   RECOMMENDATIONS:  1.  NG tube.  2.  IV fluids and IV antibiotics.  3.  If the patient is not improved with bowel rest, he may need small bowel      follow-through versus exploratory laparotomy over the next day of two.   I will follow him closely with you.  Thank you very much for this  consultation.  BET/MEDQ  D:  02/02/2004  T:  02/03/2004  Job:  [id number]

## 2010-07-10 NOTE — H&P (Signed)
Craig Bentley, Craig Bentley NO.:  [id number]   MEDICAL RECORD NO.:  [id number]          PATIENT TYPE:  INP   LOCATION:  5733                         FACILITY:  [facility]   PHYSICIAN:  Pearlean Brownie, M.D.DATE OF BIRTH:  1955/12/31   DATE OF ADMISSION:  01/30/2004  DATE OF DISCHARGE:                                HISTORY & PHYSICAL   CHIEF COMPLAINT:  Abdominal pain.   HISTORY OF PRESENT ILLNESS:  The patient is a 55 year old African American  male with history of schizophrenia, who lives in an assisted living  facility.  He was brought by EMS following a call for pain.  The patient was  found outside at the nursing home.  He has reportedly been vomiting for the  last 2 days.  Paramedic exam notes abdominal distention, but no palpable  masses or tenderness to palpation.  The patient reports feeling ill for 2  days.  He has not had a bowel movement for 3 days.  He has a long history of  constipation and was admitted in 2003 for a fecal impaction that resolved  with enemas and MiraLax.  The patient also reports recent difficulty  urinating and having very dark urine, and he has also been having fever and  chills.   REVIEW OF SYSTEMS:  Review of systems negative for weight loss, negative for  jaundice, negative for chest pain.  The patient is feeling short of breath  and tachypneic at time of exam.  GI symptoms as above.  The patient does  have a strong appetite and is requesting to eat and drink.  The patient  denies any recent blood in his bowel movements.   PAST MEDICAL HISTORY:  1.  Schizophrenia.  2.  Hypertension.  3.  History of fecal impaction.   SURGICAL HISTORY:  The patient has had an abdominal surgery as a child  following a fall.   FAMILY HISTORY:  The patient's mother is deceased; she died of cancer.  Patient's father is deceased; his history is unknown.   SOCIAL HISTORY:  The patient has lived 13 years in an assisted living  facility.  He has no  family in [place].  His sister is apparently his  health care power of attorney; her name is Community education officer.  The patient smokes  2 packs per day and has for the last 10-12 years.  He uses no alcohol  currently, however, he used to use a fifth per day about 13 years ago.  The  patient reports no drug use history except marijuana many years ago.   ALLERGIES:  No known drug allergies.   MEDICATIONS:  1.  MiraLax daily.  2.  Lactulose 30 mL daily.  3.  Tenormin 50 mg b.i.d.  4.  Cogentin 2 mg b.i.d.  5.  Haldol 5 mg t.i.d.  6.  Clorazil 100 mg q.a.m., 200 mg at noon and 400 mg at night.  7.  He receives Benadryl, Darvocet and Tylenol as needed.   PHYSICAL EXAM:  VITAL SIGNS:  Temperature 101.2, heart rate is 83-120, blood  pressure 103-120/73-95, respirations 22-30,  saturation is 95% on room air.  GENERAL:  Awake, alert and oriented x3.  He is in mild distress.  CHEST:  Clear to auscultation bilaterally.  CARDIOVASCULAR:  He is tachycardic.  Normal S1 and S2 with no murmurs.  ABDOMINAL EXAM:  Soft.  The abdomen is full and mildly distended.  There are  no appreciable bowel sounds.  The bladder is not palpated, but the patient  does have a Foley in.  EXTREMITY EXAM:  No edema.  Warm x4 with normal capillary refill.  HEENT:  Anicteric sclerae, dry mucous membranes and non-injected  conjunctivae.  NECK:  No JVD.  SKIN:  No rash.  NEUROLOGIC:  Exam is nonfocal.  RECTAL:  Large smooth prostate.  There feels as if there is a mass of stool  6-8 cm into the rectum.  He is guaiac-negative.   STUDIES:  An EKG shows sinus tachycardia.   Abdominal x-ray shows bibasilar atelectasis with scarring, diffuse  peribronchial thickening, mild cardiomegaly with no edema, ileus versus  small-bowel obstruction, cannot definitively exclude free air.   LABORATORY DATA:  White blood cell count 8.1, hemoglobin 14.4, platelets  311,000.  Sodium 137, potassium 3.6, chloride 102, bicarb 16, BUN 58,   creatinine 2.5, glucose 185, AST 28, ALT 24, alkaline phosphatase 58, total  bilirubin 1.2, total protein 5.9, albumin 2.8, lipase was 15.  CK 145.  UA  revealed specific gravity of 1.025, ketones 15, negative protein, positive  nitrite and leukocyte esterase, 3-6 white blood cells, 3-6 red blood cells  and many bacteria.  ABG on 15 L face mask reveals pH of 7.33, PCO2 of 35,  PO2 of 276, bicarb of 18 and 100% saturations.  Blood cultures x2 are  pending.   ASSESSMENT AND PLAN:  [age]-year-old male with schizophrenia and fecal  impaction versus small-bowel obstruction.   1.  Impaction versus small-bowel obstruction:  We will check an      abdominopelvic CT with oral contrast for extent of the impaction and      rule out any diverticulitis or colonic perforation requiring surgery.      If there is an impaction with no perforation, then we will use enemas      and MiraLax until resolved, as has been done in the past; we can also      use GoLYTELY.  An nasogastric tube may be necessary if there is also      small-bowel obstruction.  CT will also allow [place] to evaluate for possible      urinary obstruction or hydronephrosis.  2.  Pyelonephritis versus urinary tract infection:  We will start a renal      dose of Cipro, check urine cultures and blood cultures.  3.  Acute renal failure versus chronic renal insufficiency:  The patient has      no known renal disease.  He has been given a 1 L normal saline bolus and      we will now start 200 mL/hr.  We will attempt to check fractional      excretion of sodium.  At this point, I suspect this is prerenal, given      his BUN-to-creatinine ratio.  His creatinine clearance is 46.  4.  Psychiatric:  We will change oral medications to intravenous forms that      are available if the patient must be      strict nothing-by-mouth for small-bowel obstruction.  5.  Elevated glucose:  We will see what his glucose is  on morning     laboratories following  aggressive hydration.  If it remains elevated, we      will check an A1c and monitor his capillary blood glucoses.       TV/MEDQ  D:  01/31/2004  T:  01/31/2004  Job:  [id number]

## 2010-11-02 ENCOUNTER — Emergency Department (HOSPITAL_COMMUNITY): Payer: Medicaid Other

## 2010-11-02 ENCOUNTER — Emergency Department (HOSPITAL_COMMUNITY)
Admission: EM | Admit: 2010-11-02 | Discharge: 2010-11-03 | Disposition: A | Discharge status: Home / Self Care | Payer: Medicaid Other | Attending: Emergency Medicine | Admitting: Emergency Medicine

## 2010-11-02 DIAGNOSIS — R509 Fever, unspecified: Secondary | ICD-10-CM | POA: Insufficient documentation

## 2010-11-02 DIAGNOSIS — B9789 Other viral agents as the cause of diseases classified elsewhere: Secondary | ICD-10-CM | POA: Insufficient documentation

## 2010-11-02 DIAGNOSIS — R05 Cough: Secondary | ICD-10-CM | POA: Insufficient documentation

## 2010-11-02 DIAGNOSIS — R000 Tachycardia, unspecified: Secondary | ICD-10-CM | POA: Insufficient documentation

## 2010-11-02 DIAGNOSIS — I10 Essential (primary) hypertension: Secondary | ICD-10-CM | POA: Insufficient documentation

## 2010-11-02 DIAGNOSIS — R61 Generalized hyperhidrosis: Secondary | ICD-10-CM | POA: Insufficient documentation

## 2010-11-02 DIAGNOSIS — R059 Cough, unspecified: Secondary | ICD-10-CM | POA: Insufficient documentation

## 2010-11-02 DIAGNOSIS — E109 Type 1 diabetes mellitus without complications: Secondary | ICD-10-CM | POA: Insufficient documentation

## 2010-11-02 DIAGNOSIS — R109 Unspecified abdominal pain: Secondary | ICD-10-CM | POA: Insufficient documentation

## 2010-11-02 LAB — DIFFERENTIAL
Basophils Absolute: 0 10*3/uL (ref 0.0–0.1)
Basophils Relative: 0 % (ref 0–1)
Eosinophils Absolute: 0 10*3/uL (ref 0.0–0.7)
Eosinophils Relative: 0 % (ref 0–5)
Monocytes Absolute: 1.1 10*3/uL — ABNORMAL HIGH (ref 0.1–1.0)
Neutro Abs: 8.1 10*3/uL — ABNORMAL HIGH (ref 1.7–7.7)

## 2010-11-02 LAB — CBC
Hemoglobin: 12.3 g/dL — ABNORMAL LOW (ref 13.0–17.0)
MCHC: 32.9 g/dL (ref 30.0–36.0)
Platelets: 251 10*3/uL (ref 150–400)
RDW: 13.2 % (ref 11.5–15.5)

## 2010-11-02 LAB — POCT I-STAT, CHEM 8
Calcium, Ion: 1.17 mmol/L (ref 1.12–1.32)
Creatinine, Ser: 0.8 mg/dL (ref 0.50–1.35)
Hemoglobin: 14.6 g/dL (ref 13.0–17.0)
Sodium: 136 mEq/L (ref 135–145)
TCO2: 25 mmol/L (ref 0–100)

## 2010-11-02 LAB — URINALYSIS, ROUTINE W REFLEX MICROSCOPIC
Ketones, ur: NEGATIVE mg/dL
Leukocytes, UA: NEGATIVE
Protein, ur: NEGATIVE mg/dL
Urobilinogen, UA: 1 mg/dL (ref 0.0–1.0)

## 2011-05-04 ENCOUNTER — Emergency Department (HOSPITAL_COMMUNITY)
Admission: EM | Admit: 2011-05-04 | Discharge: 2011-05-04 | Disposition: A | Discharge status: Other Acute Inpatient Hospital | Payer: Medicaid Other | Attending: Emergency Medicine | Admitting: Emergency Medicine

## 2011-05-04 ENCOUNTER — Encounter (HOSPITAL_COMMUNITY): Payer: Self-pay | Admitting: Emergency Medicine

## 2011-05-04 DIAGNOSIS — J4489 Other specified chronic obstructive pulmonary disease: Secondary | ICD-10-CM | POA: Insufficient documentation

## 2011-05-04 DIAGNOSIS — K1379 Other lesions of oral mucosa: Secondary | ICD-10-CM

## 2011-05-04 DIAGNOSIS — E119 Type 2 diabetes mellitus without complications: Secondary | ICD-10-CM | POA: Insufficient documentation

## 2011-05-04 DIAGNOSIS — K1370 Unspecified lesions of oral mucosa: Secondary | ICD-10-CM | POA: Insufficient documentation

## 2011-05-04 DIAGNOSIS — J449 Chronic obstructive pulmonary disease, unspecified: Secondary | ICD-10-CM | POA: Insufficient documentation

## 2011-05-04 DIAGNOSIS — Z79899 Other long term (current) drug therapy: Secondary | ICD-10-CM | POA: Insufficient documentation

## 2011-05-04 DIAGNOSIS — I10 Essential (primary) hypertension: Secondary | ICD-10-CM | POA: Insufficient documentation

## 2011-05-04 DIAGNOSIS — K089 Disorder of teeth and supporting structures, unspecified: Secondary | ICD-10-CM | POA: Insufficient documentation

## 2011-05-04 HISTORY — DX: Essential (primary) hypertension: I10

## 2011-05-04 HISTORY — DX: Schizoaffective disorder, unspecified: F25.9

## 2011-05-04 HISTORY — DX: Chronic obstructive pulmonary disease, unspecified: J44.9

## 2011-05-04 HISTORY — DX: Schizoaffective disorder, bipolar type: F25.0

## 2011-05-04 MED ORDER — MAGIC MOUTHWASH W/LIDOCAINE: 10.0000 mL | Freq: Four times a day (QID) | ORAL | Status: DC

## 2011-05-04 NOTE — ED Notes (Signed)
Called [facility] for transport back to [facility]

## 2011-05-04 NOTE — Discharge Instructions (Signed)
Dental Care and Dentist Visits   Dental care supports good overall health. Regular dental visits can also help you avoid dental pain, bleeding, infection, and other more serious health problems in the future. It is important to keep the mouth healthy because diseases in the teeth, gums, and other oral tissues can spread to other areas of the body. Some problems, such as diabetes, heart disease, and pre-term labor have been associated with poor oral health.   See your dentist every 6 months. If you experience emergency problems such as a toothache or broken tooth, go to the dentist right away. If you see your dentist regularly, you may catch problems early. It is easier to be treated for problems in the early stages.   WHAT TO EXPECT AT A DENTIST VISIT   Your dentist will look for many common oral health problems and recommend proper treatment. At your regular dental visit, you can expect:   Gentle cleaning of the teeth and gums. This includes scraping and polishing. This helps to remove the sticky substance around the teeth and gums (plaque). Plaque forms in the mouth shortly after eating. Over time, plaque hardens on the teeth as tartar. If tartar is not removed regularly, it can cause problems. Cleaning also helps remove stains.   Periodic X-rays. These pictures of the teeth and supporting bone will help your dentist assess the health of your teeth.   Periodic fluoride treatments. Fluoride is a natural mineral shown to help strengthen teeth. Fluoride treatment involves applying a fluoride gel or varnish to the teeth. It is most commonly done in children.   Examination of the mouth, tongue, jaws, teeth, and gums to look for any oral health problems, such as:   Cavities (dental caries). This is decay on the tooth caused by plaque, sugar, and acid in the mouth. It is best to catch a cavity when it is small.   Inflammation of the gums caused by plaque buildup (gingivitis).   Problems with the mouth or malformed or  misaligned teeth.   Oral cancer or other diseases of the soft tissues or jaws.   KEEP YOUR TEETH AND GUMS HEALTHY   For healthy teeth and gums, follow these general guidelines as well as your dentist's specific advice:   Have your teeth professionally cleaned at the dentist every 6 months.   Brush twice daily with a fluoride toothpaste.   Floss your teeth daily.   Ask your dentist if you need fluoride supplements, treatments, or fluoride toothpaste.   Eat a healthy diet. Reduce foods and drinks with added sugar.   Avoid smoking.  TREATMENT FOR ORAL HEALTH PROBLEMS   If you have oral health problems, treatment varies depending on the conditions present in your teeth and gums.   Your caregiver will most likely recommend good oral hygiene at each visit.   For cavities, gingivitis, or other oral health disease, your caregiver will perform a procedure to treat the problem. This is typically done at a separate appointment. Sometimes your caregiver will refer you to another dental specialist for specific tooth problems or for surgery.  SEEK IMMEDIATE DENTAL CARE IF:   You have pain, bleeding, or soreness in the gum, tooth, jaw, or mouth area.   A permanent tooth becomes loose or separated from the gum socket.   You experience a blow or injury to the mouth or jaw area.  Document Released: 10/21/2010 Document Revised: 01/28/2011 Document Reviewed: 10/21/2010   [employer] Patient Information 2012 [employer].

## 2011-05-04 NOTE — ED Notes (Signed)
PT. REPORTS RIGHT UPPER AND LOWER GUM PAIN FOR SEVERAL DAYS , ALSO REQUESTING STD CHECK STATES GIRLFRIEND ADVISED HIM TO BE CHECKED FOR HERPES, DENIES GENITAL SYMPTOMS, ARRIVED WITH EMS FROM AN ASSISTED LIVING .

## 2011-05-04 NOTE — ED Notes (Signed)
Patient with pain in gums on right top and bottom, patient with no teeth, all pulled tens years ago.

## 2011-05-04 NOTE — ED Provider Notes (Signed)
History     CSN: [id number]  Arrival date & time 05/04/11  2028   First MD Initiated Contact with Patient 05/04/11 2233      Chief Complaint  Patient presents with  . Dental Pain     HPI  History provided by the patient. Patient is a 56 year old African American male with history of hypertension, diabetes COPD assess her friend who presents with complaints of right upper and lower abdominal pains. Patient is without teeth but states that he is not using dentures currently. He reports having increasing pain for the past several weeks. Patient has not used anything for her symptoms. Patient denies having a hot beverage or foods causing a burn. Patient denies any swelling of the mouth. Difficulty swallowing or breathing. Patient has secondary complaints of concerns for possible herpes exposure. He states his girlfriend advising recheck for herpes. he denies having any rash or pain genitals. Denies any penile discharge. Denies any testicle swelling or pain.   Past Medical History  Diagnosis Date  . Hypertension   . Diabetes mellitus   . Schizophrenia, schizo-affective   . COPD (chronic obstructive pulmonary disease)     History reviewed. No pertinent past surgical history.  No family history on file.  History  Substance Use Topics  . Smoking status: Current Everyday Smoker  . Smokeless tobacco: Not on file  . Alcohol Use: Yes      Review of Systems  Constitutional: Negative for fever and chills.  HENT: Negative for sore throat.   Respiratory: Negative for cough and shortness of breath.   All other systems reviewed and are negative.    Allergies  Review of patient's allergies indicates no known allergies.  Home Medications   Current Outpatient Rx  Name Route Sig Dispense Refill  . ALBUTEROL SULFATE HFA 108 (90 BASE) MCG/ACT IN AERS Inhalation Inhale 2 puffs into the lungs every 4 (four) hours as needed. For cough/shortness of breath    . ASPIRIN EC 81 MG PO TBEC  Oral Take 81 mg by mouth daily. 30 minutes prior to niaspan    . BENZTROPINE MESYLATE 1 MG PO TABS Oral Take 1 mg by mouth 2 (two) times daily.    Marland Kitchen CLOZAPINE 100 MG PO TABS Oral Take 100 mg by mouth 2 (two) times daily with breakfast and lunch.    . CLOZAPINE 100 MG PO TABS Oral Take 400 mg by mouth at bedtime.    Marland Kitchen DOCUSATE SODIUM 100 MG PO CAPS Oral Take 100 mg by mouth 2 (two) times daily.    Marland Kitchen HALOPERIDOL 5 MG PO TABS Oral Take 5 mg by mouth every morning.    Marland Kitchen HALOPERIDOL 5 MG PO TABS Oral Take 10 mg by mouth at bedtime.    Marland Kitchen HYDROCHLOROTHIAZIDE 12.5 MG PO CAPS Oral Take 12.5 mg by mouth every morning.    Marland Kitchen LACTULOSE 10 GM/15ML PO SOLN Oral Take 30 g by mouth 2 (two) times daily.    Marland Kitchen LISINOPRIL 20 MG PO TABS Oral Take 20 mg by mouth every morning.    Marland Kitchen METFORMIN HCL 500 MG PO TABS Oral Take 500 mg by mouth 2 (two) times daily with a meal.    . TAB-A-VITE/IRON PO TABS Oral Take 1 tablet by mouth every morning.    Marland Kitchen NIACIN ER (ANTIHYPERLIPIDEMIC) 500 MG PO TBCR Oral Take 500 mg by mouth at bedtime.    Marland Kitchen SIMVASTATIN 5 MG PO TABS Oral Take 5 mg by mouth at bedtime.  BP 99/58  Pulse 95  Temp(Src) 98.2 F (36.8 C) (Oral)  Resp 20  SpO2 97%  Physical Exam  Nursing note and vitals reviewed. Constitutional: He appears well-developed and well-nourished. No distress.  HENT:  Head: Normocephalic.       Edentulous. Slight ulceration and tenderness on right upper gum line. No swelling of gum. No swelling under tongue.  Cardiovascular: Normal rate and regular rhythm.   Pulmonary/Chest: Effort normal and breath sounds normal.  Abdominal: There is no tenderness.  Genitourinary: Penis normal. No penile tenderness.       No rash is seen. No lymphadenopathy of groin. No penile discharge  Skin: Skin is warm. No rash noted.  Psychiatric: He has a normal mood and affect. His behavior is normal.    ED Course  Procedures      1. Mouth sore       MDM  10:50 PM patient seen and  evaluated. Patient in no acute distress.   Patient given local benzocaine over the area with improvement in pain. Prescribed Magic mouthwash.     Angus Seller, [place] 05/05/11 [phone]

## 2011-05-06 NOTE — ED Provider Notes (Signed)
History/physical exam/procedure(s) were performed by non-physician practitioner and as supervising physician I was immediately available for consultation/collaboration. I have reviewed all notes and am in agreement with care and plan.   Nattalie Santiesteban S Darrill Vreeland, MD 05/06/11 1303 

## 2011-07-19 ENCOUNTER — Emergency Department (HOSPITAL_COMMUNITY): Payer: Medicaid Other

## 2011-07-19 ENCOUNTER — Emergency Department (HOSPITAL_COMMUNITY)
Admission: EM | Admit: 2011-07-19 | Discharge: 2011-07-19 | Disposition: A | Discharge status: Home / Self Care | Payer: Medicaid Other | Attending: Emergency Medicine | Admitting: Emergency Medicine

## 2011-07-19 ENCOUNTER — Encounter (HOSPITAL_COMMUNITY): Payer: Self-pay | Admitting: Emergency Medicine

## 2011-07-19 DIAGNOSIS — R143 Flatulence: Secondary | ICD-10-CM | POA: Insufficient documentation

## 2011-07-19 DIAGNOSIS — E119 Type 2 diabetes mellitus without complications: Secondary | ICD-10-CM | POA: Insufficient documentation

## 2011-07-19 DIAGNOSIS — F17200 Nicotine dependence, unspecified, uncomplicated: Secondary | ICD-10-CM | POA: Insufficient documentation

## 2011-07-19 DIAGNOSIS — R142 Eructation: Secondary | ICD-10-CM | POA: Insufficient documentation

## 2011-07-19 DIAGNOSIS — I10 Essential (primary) hypertension: Secondary | ICD-10-CM | POA: Insufficient documentation

## 2011-07-19 DIAGNOSIS — K5900 Constipation, unspecified: Secondary | ICD-10-CM | POA: Insufficient documentation

## 2011-07-19 DIAGNOSIS — R141 Gas pain: Secondary | ICD-10-CM | POA: Insufficient documentation

## 2011-07-19 DIAGNOSIS — R109 Unspecified abdominal pain: Secondary | ICD-10-CM | POA: Insufficient documentation

## 2011-07-19 LAB — DIFFERENTIAL
Basophils Absolute: 0 10*3/uL (ref 0.0–0.1)
Basophils Relative: 0 % (ref 0–1)
Monocytes Relative: 3 % (ref 3–12)
Neutro Abs: 13.1 10*3/uL — ABNORMAL HIGH (ref 1.7–7.7)
Neutrophils Relative %: 90 % — ABNORMAL HIGH (ref 43–77)

## 2011-07-19 LAB — BASIC METABOLIC PANEL
BUN: 20 mg/dL (ref 6–23)
Chloride: 101 mEq/L (ref 96–112)
GFR calc Af Amer: >90 mL/min (ref >90)
GFR calc non Af Amer: >90 mL/min (ref >90)
Potassium: 4.2 mEq/L (ref 3.5–5.1)
Sodium: 140 mEq/L (ref 135–145)

## 2011-07-19 LAB — OCCULT BLOOD, POC DEVICE: Fecal Occult Bld: NEGATIVE

## 2011-07-19 LAB — CBC
Hemoglobin: 15.2 g/dL (ref 13.0–17.0)
MCHC: 32.6 g/dL (ref 30.0–36.0)
Platelets: 231 10*3/uL (ref 150–400)

## 2011-07-19 MED ORDER — MORPHINE SULFATE 4 MG/ML IJ SOLN
4.0000 mg | Freq: Once | INTRAMUSCULAR | Status: AC
  Administered 2011-07-19: 4 mg via INTRAVENOUS
  Filled 2011-07-19: qty 1

## 2011-07-19 MED ORDER — ONDANSETRON HCL 4 MG/2ML IJ SOLN
4.0000 mg | Freq: Once | INTRAMUSCULAR | Status: AC
  Administered 2011-07-19: 4 mg via INTRAVENOUS
  Filled 2011-07-19: qty 2

## 2011-07-19 MED ORDER — BISACODYL 10 MG RE SUPP
10.0000 mg | Freq: Once | RECTAL | Status: AC
  Administered 2011-07-19: 10 mg via RECTAL
  Filled 2011-07-19: qty 1

## 2011-07-19 MED ORDER — FLEET ENEMA 7-19 GM/118ML RE ENEM
1.0000 | ENEMA | Freq: Once | RECTAL | Status: AC
  Administered 2011-07-19: 1 via RECTAL
  Filled 2011-07-19: qty 1

## 2011-07-19 NOTE — ED Notes (Signed)
Pt in CT at this time.

## 2011-07-19 NOTE — ED Notes (Signed)
Pt feeling much better on d/c - in no acute distress, A&Ox4.

## 2011-07-19 NOTE — ED Notes (Signed)
ZOX:[id number]<AV> Expected date:07/19/11<BR> Expected time:10:22 AM<BR> Means of arrival:Ambulance<BR> Comments:<BR> Abdomen distention

## 2011-07-19 NOTE — ED Notes (Signed)
[facility] notified of need for pt transport back to nursing facility 

## 2011-07-19 NOTE — ED Notes (Signed)
Review discharge instructions w/ Shamika, caregiver of pt at [facility] - caregiver denies any further questions or concerns at present.

## 2011-07-19 NOTE — Discharge Instructions (Signed)
Please be sure that Craig Bentley is getting is bowel medications as prescribed. He needs to see his primary care provider within the next week for medication adjustments if he is continuing to be constipated on his normal medications regimen.  Constipation in Adults Constipation is having fewer than 2 bowel movements per week. Usually, the stools are hard. As we grow older, constipation is more common. If you try to fix constipation with laxatives, the problem may get worse. This is because laxatives taken over a long period of time make the colon muscles weaker. A low-fiber diet, not taking in enough fluids, and taking some medicines may make these problems worse. MEDICATIONS THAT MAY CAUSE CONSTIPATION  Water pills (diuretics).   Calcium channel blockers (used to control blood pressure and for the heart).   Certain pain medicines (narcotics).   Anticholinergics.   Anti-inflammatory agents.   Antacids that contain aluminum.  DISEASES THAT CONTRIBUTE TO CONSTIPATION  Diabetes.   Parkinson's disease.   Dementia.   Stroke.   Depression.   Illnesses that cause problems with salt and water metabolism.  HOME CARE INSTRUCTIONS   Constipation is usually best cared for without medicines. Increasing dietary fiber and eating more fruits and vegetables is the best way to manage constipation.   Slowly increase fiber intake to 25 to 38 grams per day. Whole grains, fruits, vegetables, and legumes are good sources of fiber. A dietitian can further help you incorporate high-fiber foods into your diet.   Drink enough water and fluids to keep your urine clear or pale yellow.   A fiber supplement may be added to your diet if you cannot get enough fiber from foods.   Increasing your activities also helps improve regularity.   Suppositories, as suggested by your caregiver, will also help. If you are using antacids, such as aluminum or calcium containing products, it will be helpful to switch to  products containing magnesium if your caregiver says it is okay.   If you have been given a liquid injection (enema) today, this is only a temporary measure. It should not be relied on for treatment of longstanding (chronic) constipation.   Stronger measures, such as magnesium sulfate, should be avoided if possible. This may cause uncontrollable diarrhea. Using magnesium sulfate may not allow you time to make it to the bathroom.  SEEK IMMEDIATE MEDICAL CARE IF:   There is bright red blood in the stool.   The constipation stays for more than 4 days.   There is belly (abdominal) or rectal pain.   You do not seem to be getting better.   You have any questions or concerns.  MAKE SURE YOU:   Understand these instructions.   Will watch your condition.   Will get help right away if you are not doing well or get worse.  Document Released: 11/07/2003 Document Revised: 01/28/2011 Document Reviewed: 01/12/2011 [facility] Patient Information 2012 [place], [place].

## 2011-07-19 NOTE — ED Notes (Signed)
Pt states abd distention x 30 days with new onset abd pain at 12am. Pt states he was somewhat nauseated and vomited x i this am. Pt reports large soft BM this am. EMS reports hx of impaction.

## 2011-07-19 NOTE — ED Provider Notes (Signed)
He complains of abdominal pain, and distention for she days with normal soft bowel movement today. His has previously had bowel impaction. The abdomen is distended, soft, and nontender.   Medical screening examination/treatment/procedure(s) were conducted as a shared visit with non-physician practitioner(s) and myself.  I personally evaluated the patient during the encounter  Flint Melter, MD 07/21/11 [phone]
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
## 2011-07-20 ENCOUNTER — Emergency Department (HOSPITAL_COMMUNITY): Payer: Medicaid Other

## 2011-07-20 ENCOUNTER — Encounter (HOSPITAL_COMMUNITY): Payer: Self-pay | Admitting: *Deleted

## 2011-07-20 ENCOUNTER — Inpatient Hospital Stay (HOSPITAL_COMMUNITY)
Admission: EM | Admit: 2011-07-20 | Discharge: 2011-07-30 | DRG: 389 | Disposition: A | Discharge status: Nursing Home (Includes ICF) | Payer: Medicaid Other | Attending: Internal Medicine | Admitting: Internal Medicine

## 2011-07-20 DIAGNOSIS — E876 Hypokalemia: Secondary | ICD-10-CM | POA: Diagnosis present

## 2011-07-20 DIAGNOSIS — K56609 Unspecified intestinal obstruction, unspecified as to partial versus complete obstruction: Principal | ICD-10-CM | POA: Diagnosis present

## 2011-07-20 DIAGNOSIS — J4489 Other specified chronic obstructive pulmonary disease: Secondary | ICD-10-CM | POA: Diagnosis present

## 2011-07-20 DIAGNOSIS — E118 Type 2 diabetes mellitus with unspecified complications: Secondary | ICD-10-CM

## 2011-07-20 DIAGNOSIS — R112 Nausea with vomiting, unspecified: Secondary | ICD-10-CM

## 2011-07-20 DIAGNOSIS — E1165 Type 2 diabetes mellitus with hyperglycemia: Secondary | ICD-10-CM

## 2011-07-20 DIAGNOSIS — D72829 Elevated white blood cell count, unspecified: Secondary | ICD-10-CM | POA: Diagnosis present

## 2011-07-20 DIAGNOSIS — K56600 Partial intestinal obstruction, unspecified as to cause: Secondary | ICD-10-CM

## 2011-07-20 DIAGNOSIS — K5669 Other intestinal obstruction: Secondary | ICD-10-CM

## 2011-07-20 DIAGNOSIS — K3184 Gastroparesis: Secondary | ICD-10-CM | POA: Diagnosis present

## 2011-07-20 DIAGNOSIS — I10 Essential (primary) hypertension: Secondary | ICD-10-CM

## 2011-07-20 DIAGNOSIS — E1149 Type 2 diabetes mellitus with other diabetic neurological complication: Secondary | ICD-10-CM | POA: Diagnosis present

## 2011-07-20 DIAGNOSIS — I745 Embolism and thrombosis of iliac artery: Secondary | ICD-10-CM | POA: Diagnosis present

## 2011-07-20 DIAGNOSIS — F209 Schizophrenia, unspecified: Secondary | ICD-10-CM | POA: Diagnosis present

## 2011-07-20 DIAGNOSIS — J449 Chronic obstructive pulmonary disease, unspecified: Secondary | ICD-10-CM | POA: Diagnosis present

## 2011-07-20 DIAGNOSIS — J9610 Chronic respiratory failure, unspecified whether with hypoxia or hypercapnia: Secondary | ICD-10-CM | POA: Diagnosis present

## 2011-07-20 LAB — COMPREHENSIVE METABOLIC PANEL
AST: 16 U/L (ref 0–37)
Albumin: 3.8 g/dL (ref 3.5–5.2)
BUN: 25 mg/dL — ABNORMAL HIGH (ref 6–23)
CO2: 34 mEq/L — ABNORMAL HIGH (ref 19–32)
CO2: 35 mEq/L — ABNORMAL HIGH (ref 19–32)
Calcium: 9.4 mg/dL (ref 8.4–10.5)
Calcium: 9.3 mg/dL (ref 8.4–10.5)
Chloride: 95 mEq/L — ABNORMAL LOW (ref 96–112)
Creatinine, Ser: 0.68 mg/dL (ref 0.50–1.35)
Creatinine, Ser: 0.74 mg/dL (ref 0.50–1.35)
GFR calc Af Amer: >90 mL/min (ref >90)
GFR calc non Af Amer: >90 mL/min (ref >90)
GFR calc non Af Amer: >90 mL/min (ref >90)
Glucose, Bld: 134 mg/dL — ABNORMAL HIGH (ref 70–99)
Total Bilirubin: 0.4 mg/dL (ref 0.3–1.2)

## 2011-07-20 LAB — URINALYSIS, ROUTINE W REFLEX MICROSCOPIC
Leukocytes, UA: NEGATIVE
Protein, ur: 30 mg/dL — AB
Specific Gravity, Urine: 1.035 — ABNORMAL HIGH (ref 1.005–1.030)
Urobilinogen, UA: 1 mg/dL (ref 0.0–1.0)

## 2011-07-20 LAB — CBC
HCT: 42.7 % (ref 39.0–52.0)
Hemoglobin: 13.9 g/dL (ref 13.0–17.0)
MCHC: 32.6 g/dL (ref 30.0–36.0)
MCV: 86.1 fL (ref 78.0–100.0)
RDW: 13.2 % (ref 11.5–15.5)

## 2011-07-20 LAB — DIFFERENTIAL
Basophils Absolute: 0 10*3/uL (ref 0.0–0.1)
Basophils Relative: 0 % (ref 0–1)
Eosinophils Relative: 0 % (ref 0–5)
Monocytes Absolute: 0.9 10*3/uL (ref 0.1–1.0)
Monocytes Relative: 7 % (ref 3–12)
Neutro Abs: 11.2 10*3/uL — ABNORMAL HIGH (ref 1.7–7.7)

## 2011-07-20 LAB — PROTIME-INR: Prothrombin Time: 13 seconds (ref 11.6–15.2)

## 2011-07-20 LAB — LIPASE, BLOOD: Lipase: 18 U/L (ref 11–59)

## 2011-07-20 LAB — PHOSPHORUS: Phosphorus: 3.8 mg/dL (ref 2.3–4.6)

## 2011-07-20 LAB — URINE MICROSCOPIC-ADD ON

## 2011-07-20 MED ORDER — HALOPERIDOL 5 MG PO TABS
10.0000 mg | ORAL_TABLET | Freq: Every day | ORAL | Status: DC
  Administered 2011-07-21: 10 mg via ORAL
  Filled 2011-07-20 (×2): qty 2

## 2011-07-20 MED ORDER — CLOZAPINE 100 MG PO TABS
100.0000 mg | ORAL_TABLET | Freq: Two times a day (BID) | ORAL | Status: DC
  Filled 2011-07-20 (×3): qty 1

## 2011-07-20 MED ORDER — DOCUSATE SODIUM 100 MG PO CAPS
100.0000 mg | ORAL_CAPSULE | Freq: Two times a day (BID) | ORAL | Status: DC
  Administered 2011-07-22 – 2011-07-25 (×7): 100 mg via ORAL
  Filled 2011-07-20 (×12): qty 1

## 2011-07-20 MED ORDER — SODIUM CHLORIDE 0.9 % IV SOLN
1000.0000 mL | INTRAVENOUS | Status: DC
  Administered 2011-07-20: 1000 mL via INTRAVENOUS

## 2011-07-20 MED ORDER — SIMVASTATIN 5 MG PO TABS
5.0000 mg | ORAL_TABLET | Freq: Every day | ORAL | Status: DC
  Filled 2011-07-20: qty 1

## 2011-07-20 MED ORDER — ONDANSETRON HCL 4 MG/2ML IJ SOLN
4.0000 mg | Freq: Once | INTRAMUSCULAR | Status: AC
  Administered 2011-07-20: 4 mg via INTRAVENOUS
  Filled 2011-07-20: qty 2

## 2011-07-20 MED ORDER — HYDROMORPHONE HCL PF 1 MG/ML IJ SOLN
1.0000 mg | Freq: Once | INTRAMUSCULAR | Status: AC
  Administered 2011-07-20: 1 mg via INTRAVENOUS
  Filled 2011-07-20: qty 1

## 2011-07-20 MED ORDER — BISACODYL 10 MG RE SUPP: 10.0000 mg | Freq: Every day | RECTAL | Status: DC | PRN

## 2011-07-20 MED ORDER — ASPIRIN EC 81 MG PO TBEC
81.0000 mg | DELAYED_RELEASE_TABLET | Freq: Every day | ORAL | Status: DC
  Administered 2011-07-22 – 2011-07-30 (×9): 81 mg via ORAL
  Filled 2011-07-20 (×10): qty 1

## 2011-07-20 MED ORDER — POTASSIUM CHLORIDE 10 MEQ/100ML IV SOLN
10.0000 meq | INTRAVENOUS | Status: AC
  Administered 2011-07-21 (×6): 10 meq via INTRAVENOUS
  Filled 2011-07-20 (×4): qty 100

## 2011-07-20 MED ORDER — BENZTROPINE MESYLATE 1 MG PO TABS
1.0000 mg | ORAL_TABLET | Freq: Two times a day (BID) | ORAL | Status: DC
  Administered 2011-07-21: 1 mg via ORAL
  Filled 2011-07-20 (×3): qty 1

## 2011-07-20 MED ORDER — HALOPERIDOL 5 MG PO TABS
5.0000 mg | ORAL_TABLET | Freq: Every day | ORAL | Status: DC
  Filled 2011-07-20: qty 1

## 2011-07-20 MED ORDER — NIACIN ER (ANTIHYPERLIPIDEMIC) 500 MG PO TBCR
500.0000 mg | EXTENDED_RELEASE_TABLET | Freq: Every day | ORAL | Status: DC
  Filled 2011-07-20: qty 1

## 2011-07-20 MED ORDER — SODIUM CHLORIDE 0.9 % IJ SOLN
3.0000 mL | Freq: Two times a day (BID) | INTRAMUSCULAR | Status: DC
  Administered 2011-07-21 – 2011-07-30 (×12): 3 mL via INTRAVENOUS

## 2011-07-20 MED ORDER — GUAIFENESIN 100 MG/5ML PO SOLN
10.0000 mL | ORAL | Status: DC | PRN
  Filled 2011-07-20: qty 10

## 2011-07-20 MED ORDER — SODIUM CHLORIDE 0.9 % IV SOLN
1000.0000 mL | Freq: Once | INTRAVENOUS | Status: AC
  Administered 2011-07-20: 1000 mL via INTRAVENOUS

## 2011-07-20 MED ORDER — CLOZAPINE 100 MG PO TABS
400.0000 mg | ORAL_TABLET | Freq: Every day | ORAL | Status: DC
  Filled 2011-07-20: qty 4

## 2011-07-20 MED ORDER — ONDANSETRON HCL 4 MG PO TABS: 4.0000 mg | ORAL_TABLET | Freq: Four times a day (QID) | ORAL | Status: DC | PRN

## 2011-07-20 MED ORDER — ONDANSETRON HCL 4 MG/2ML IJ SOLN
INTRAMUSCULAR | Status: AC
  Administered 2011-07-20: 18:00:00
  Filled 2011-07-20: qty 2

## 2011-07-20 MED ORDER — SODIUM CHLORIDE 0.9 % IV SOLN
INTRAVENOUS | Status: DC
  Administered 2011-07-20 – 2011-07-27 (×9): via INTRAVENOUS

## 2011-07-20 MED ORDER — LISINOPRIL 20 MG PO TABS
20.0000 mg | ORAL_TABLET | Freq: Every day | ORAL | Status: DC
Start: 2011-07-21 — End: 2011-07-21
  Filled 2011-07-20: qty 1

## 2011-07-20 MED ORDER — POTASSIUM CHLORIDE 10 MEQ/100ML IV SOLN
10.0000 meq | INTRAVENOUS | Status: AC
  Administered 2011-07-20: 10 meq via INTRAVENOUS
  Filled 2011-07-20: qty 100

## 2011-07-20 MED ORDER — ENOXAPARIN SODIUM 30 MG/0.3ML ~~LOC~~ SOLN
30.0000 mg | SUBCUTANEOUS | Status: DC
  Filled 2011-07-20: qty 0.3

## 2011-07-20 MED ORDER — ADULT MULTIVITAMIN W/MINERALS CH
1.0000 | ORAL_TABLET | Freq: Every day | ORAL | Status: DC
  Filled 2011-07-20: qty 1

## 2011-07-20 MED ORDER — ONDANSETRON HCL 4 MG/2ML IJ SOLN
4.0000 mg | Freq: Four times a day (QID) | INTRAMUSCULAR | Status: DC | PRN
  Administered 2011-07-20 – 2011-07-22 (×3): 4 mg via INTRAVENOUS
  Filled 2011-07-20 (×4): qty 2

## 2011-07-20 MED ORDER — LACTULOSE 10 GM/15ML PO SOLN
30.0000 g | Freq: Two times a day (BID) | ORAL | Status: DC
  Administered 2011-07-21: 30 g via ORAL
  Filled 2011-07-20 (×3): qty 45

## 2011-07-20 MED ORDER — IOHEXOL 300 MG/ML  SOLN
100.0000 mL | Freq: Once | INTRAMUSCULAR | Status: AC | PRN
  Administered 2011-07-20: 100 mL via INTRAVENOUS

## 2011-07-20 MED ORDER — LIDOCAINE HCL 2 % EX GEL
CUTANEOUS | Status: AC
  Filled 2011-07-20: qty 10

## 2011-07-20 MED ORDER — HYDROCHLOROTHIAZIDE 12.5 MG PO CAPS
12.5000 mg | ORAL_CAPSULE | Freq: Every morning | ORAL | Status: DC
  Filled 2011-07-20: qty 1

## 2011-07-20 MED ORDER — METFORMIN HCL 500 MG PO TABS
500.0000 mg | ORAL_TABLET | Freq: Two times a day (BID) | ORAL | Status: DC
  Filled 2011-07-20 (×3): qty 1

## 2011-07-20 MED ORDER — ALBUTEROL SULFATE HFA 108 (90 BASE) MCG/ACT IN AERS: 2.0000 | INHALATION_SPRAY | RESPIRATORY_TRACT | Status: DC | PRN

## 2011-07-20 MED ORDER — HYDROMORPHONE HCL PF 1 MG/ML IJ SOLN
1.0000 mg | INTRAMUSCULAR | Status: DC | PRN
  Administered 2011-07-23: 1 mg via INTRAVENOUS
  Filled 2011-07-20: qty 1

## 2011-07-20 NOTE — ED Notes (Signed)
md at bedside  Pt alert and oriented x4. Respirations even and unlabored, bilateral symmetrical rise and fall of chest. Skin warm and dry. In no acute distress. Denies needs.   
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
## 2011-07-20 NOTE — ED Notes (Signed)
#  16 Fr NGT placed right nare without difficulty-1200 cc's dark brown bile colored fluid out of stomach-pt has vomited ~ 200 cc's during insertion-medicated with Zofran IV prior to insertion-Hospitalist at bedside-lidocaine inserted right nare prior to insertion

## 2011-07-20 NOTE — H&P (Addendum)
PCP:  No primary provider on file.   DOA:  07/20/2011  5:30 PM  Chief Complaint:  Abdominal pain  HPI: Pt is 56 yo male with multiple and complex medical history who presents to [facility] ED with main concern of progressively worsening, sharp and generalized and mostly periumbilical abdominal pain that is radiating to bilateral groin area and back area. This initially started 1 day prior to admission and is associated with nausea, vomiting. He denies any specific aggravating or alleviating factors, no fevers, no chills, no urinary concerns, no headaches and no other systemic symptoms, no similar episodes in the past. Pt denies any recent sicknesses or hospitalizations, no sick contacts or exposures.  Allergies: No Known Allergies  Prior to Admission medications   Medication Sig Start Date End Date Taking? Authorizing Provider  aspirin EC 81 MG tablet Take 81 mg by mouth daily. 30 minutes prior to niaspan   Yes Historical Provider, MD  benztropine (COGENTIN) 1 MG tablet Take 1 mg by mouth 2 (two) times daily.   Yes Historical Provider, MD  cloZAPine (CLOZARIL) 100 MG tablet Take 100-400 mg by mouth See admin instructions. Patient takes 1 tablet by mouth twice a day at 8am and 4pm and then takes 400mg  (4 tablets) by mouth every night at bedtime   Yes Historical Provider, MD  docusate sodium (COLACE) 100 MG capsule Take 100 mg by mouth See admin instructions. Patient takes 1 capsule by mouth twice a day and may also take 1 capsule by mouth twice a day as needed For soft stools   Yes Historical Provider, MD  guaiFENesin (ROBITUSSIN) 100 MG/5ML SOLN Take 10 mLs by mouth every 4 (four) hours as needed. For cough.   Yes Historical Provider, MD  haloperidol (HALDOL) 5 MG tablet Take 5-10 mg by mouth See admin instructions. Patient takes 1 tablet by mouth every morning and 2 tablets by mouth at bedtime   Yes Historical Provider, MD  hydrochlorothiazide (MICROZIDE) 12.5 MG capsule Take 12.5 mg by mouth every  morning.   Yes Historical Provider, MD  lactulose (CHRONULAC) 10 GM/15ML solution Take 45 mLs by mouth 2 (two) times daily.    Yes Historical Provider, MD  lisinopril (PRINIVIL,ZESTRIL) 20 MG tablet Take 20 mg by mouth daily.    Yes Historical Provider, MD  metFORMIN (GLUCOPHAGE) 500 MG tablet Take 500 mg by mouth 2 (two) times daily with a meal.   Yes Historical Provider, MD  Multiple Vitamin (MULITIVITAMIN WITH MINERALS) TABS Take 1 tablet by mouth daily.   Yes Historical Provider, MD  niacin (NIASPAN) 500 MG CR tablet Take 500 mg by mouth at bedtime.   Yes Historical Provider, MD  simvastatin (ZOCOR) 5 MG tablet Take 5 mg by mouth at bedtime.   Yes Historical Provider, MD  albuterol (PROVENTIL HFA;VENTOLIN HFA) 108 (90 BASE) MCG/ACT inhaler Inhale 2 puffs into the lungs every 4 (four) hours as needed. For cough/shortness of breath    Historical Provider, MD    Past Medical History  Diagnosis Date  . Hypertension   . Diabetes mellitus   . Schizophrenia, schizo-affective   . COPD (chronic obstructive pulmonary disease)   . Impacted fece     History reviewed. No pertinent past surgical history.  Social History:  reports that he has been smoking.  He does not have any smokeless tobacco history on file. He reports that he drinks alcohol. He reports that he does not use illicit drugs.  History reviewed. No pertinent family history.  Review of  Systems:  Constitutional: Denies fever, chills, diaphoresis, appetite change and fatigue.  HEENT: Denies photophobia, eye pain, redness, hearing loss, ear pain, congestion, sore throat, rhinorrhea, sneezing, mouth sores, trouble swallowing, neck pain, neck stiffness and tinnitus.   Respiratory: Denies SOB, DOE, cough, chest tightness,  and wheezing.   Cardiovascular: Denies chest pain, palpitations and leg swelling.  Gastrointestinal: per HPI Genitourinary: Denies dysuria, urgency, frequency, hematuria, flank pain and difficulty urinating.    Musculoskeletal: Denies myalgias, back pain, joint swelling, arthralgias and gait problem.  Skin: Denies pallor, rash and wound.  Neurological: Denies dizziness, seizures, syncope, weakness, light-headedness, numbness and headaches.  Hematological: Denies adenopathy. Easy bruising, personal or family bleeding history  Psychiatric/Behavioral: Denies suicidal ideation, mood changes, confusion, nervousness, sleep disturbance and agitation   Physical Exam:  Filed Vitals:   07/20/11 1738 07/20/11 1900 07/20/11 1903 07/20/11 1905  BP: 139/70   134/57  Pulse: 101  94 97  Temp: 98.2 F (36.8 C)     TempSrc: Oral     Resp: 18     SpO2: 95% 89% 96%     Constitutional: Vital signs reviewed.  Patient is in no acute distress and cooperative with exam. Alert and oriented x3.  Head: Normocephalic and atraumatic Ear: TM normal bilaterally Mouth: no erythema or exudates, MMM Eyes: PERRL, EOMI, conjunctivae normal, No scleral icterus.  Neck: Supple, Trachea midline normal ROM, No JVD, mass, thyromegaly, or carotid bruit present.  Cardiovascular: RRR, S1 normal, S2 normal, no MRG, pulses symmetric and intact bilaterally Pulmonary/Chest: CTAB, no wheezes, rales, or rhonchi Abdominal: Soft. Tender in epigastric area, distended, bowel sounds are normal, no masses, organomegaly, or guarding present.  GU: no CVA tenderness Musculoskeletal: No joint deformities, erythema, or stiffness, ROM full and no nontender Ext: no edema and no cyanosis, pulses palpable bilaterally (DP and PT) Hematology: no cervical, inginal, or axillary adenopathy.  Neurological: A&O x3, Strenght is normal and symmetric bilaterally, cranial nerve II-XII are grossly intact, no focal motor deficit, sensory intact to light touch bilaterally.  Skin: Warm, dry and intact. No rash, cyanosis, or clubbing.  Psychiatric: Normal mood and affect. speech and behavior is normal. Judgment and thought content normal. Cognition and memory are  normal.   Labs on Admission:          Component Value Range   WBC 13.4 (*) 4.0 - 10.5 (K/uL)   RBC 4.96  4.22 - 5.81 (MIL/uL)   Hemoglobin 13.9  13.0 - 17.0 (g/dL)   HCT 40.9  81.1 - 91.4 (%)   MCV 86.1  78.0 - 100.0 (fL)   MCH 28.0  26.0 - 34.0 (pg)   MCHC 32.6  30.0 - 36.0 (g/dL)   RDW 78.2  95.6 - 21.3 (%)   Platelets 229  150 - 400 (K/uL)          Component Value Range   Sodium 141  135 - 145 (mEq/L)   Potassium 3.1 (*) 3.5 - 5.1 (mEq/L)   Chloride 95 (*) 96 - 112 (mEq/L)   CO2 34 (*) 19 - 32 (mEq/L)   Glucose, Bld 128 (*) 70 - 99 (mg/dL)   BUN 25 (*) 6 - 23 (mg/dL)   Creatinine, Ser 0.86  0.50 - 1.35 (mg/dL)   Calcium 9.4  8.4 - 57.8 (mg/dL)   Total Protein 7.4  6.0 - 8.3 (g/dL)   Albumin 3.8  3.5 - 5.2 (g/dL)   AST 17  0 - 37 (U/L)   ALT 18  0 - 53 (U/L)  Alkaline Phosphatase 72  39 - 117 (U/L)   Total Bilirubin 0.4  0.3 - 1.2 (mg/dL)   GFR calc non Af Amer >90  >90 (mL/min)   GFR calc Af Amer >90  >90 (mL/min)          Component Value Range   Lipase 18  11 - 59 (U/L)          Component Value Range   Color, Urine AMBER (*) YELLOW    APPearance CLEAR  CLEAR    Specific Gravity, Urine 1.035 (*) 1.005 - 1.030    pH 6.5  5.0 - 8.0    Glucose, UA NEGATIVE  NEGATIVE (mg/dL)   Hgb urine dipstick NEGATIVE  NEGATIVE    Bilirubin Urine SMALL (*) NEGATIVE    Ketones, ur 15 (*) NEGATIVE (mg/dL)   Protein, ur 30 (*) NEGATIVE (mg/dL)   Urobilinogen, UA 1.0  0.0 - 1.0 (mg/dL)   Nitrite NEGATIVE  NEGATIVE    Leukocytes, UA NEGATIVE  NEGATIVE           Component Value Range   Urine-Other MUCOUS PRESENT      Radiological Exams on Admission: CT abdomen and pelvis 07/20/2011 IMPRESSION:  1. Small bowel obstruction with subsequent gastric and duodenal distention with air and fluid. Two adjacent transition points inthe right abdomen (see images 46 and 49) with decompressed ileum.  2. Colon is chronically redundant and distended with stool.  3. Trace free fluid in the  left pericolic gutter and pelvis likely is reactive.  4. Indeterminate bilateral adrenal nodules, on the left measuring 17 x 20 mm. Given the density on this study, follow-up nonemergent adrenal mass protocol CT may best characterize further.  5. Chronic left iliac artery occlusion.  6. Chronic lung base disease.  Assessment/Plan Principal Problem:  *Partial small bowel obstruction - unclear what the provoking event is at this time - will admit the pt to medical floor and provide supportive care with IVF, antiemetics, analgesia - will keep pt NPO for now and based on clinical symptoms will advance the diet as pt tolerating  Active Problems:  Leukocytosis - likely reactive and secondary to demargination and stress - no need for abx identified at this time as pt is afebrile and UA is negative, no active symptoms suggestive of an infectious etiology - CBC in AM   Hypokalemia - will supplement - check Mg level - BMP in AM   Hypertension - continue home medication regimen   COPD (chronic obstructive pulmonary disease) - clinically stable - continue to monitor vitals as per floor protocol   Diabetes mellitus - will check A1C  DVT Prophylaxis - Lovenox  Code Status - Full  Education  - test results and diagnostic studies were discussed with patient - patient verbalized the understanding - questions were answered at the bedside and contact information was provided for additional questions or concerns  Time Spent on Admission: Over 30 minutes  MAGICK-Asra Gambrel 07/20/2011, 10:44 PM  [facility] Pager # [phone] Main Office # [phone]

## 2011-07-20 NOTE — ED Notes (Signed)
AVW:[id number]<XB> Expected date:07/20/11<BR> Expected time:<BR> Means of arrival:<BR> Comments:<BR> EMS 12 GC - n/v

## 2011-07-20 NOTE — ED Notes (Addendum)
Per ems pt was seen in ED yesterday for abdominal pain. Pt worked up and sent home with discharge papers for constipation. At present pt complains of n/v since yesterday. ems noted vomit in the toilet. Pt from [facility], alert and oriented x4. Pt has abdominal  Pain from nausea/ vomitting. Intermittent 8/10.   ems gave zofran 4 mg. 20 g L hand, NS KVO

## 2011-07-20 NOTE — ED Notes (Addendum)
md alerted of pts O2 stats, pt on room air O2 stats 90% when pt resting. rn placed pt on 2 L [place], now O2 stat 96%.   pts speech slightly mumbled. rn asked pt if his tongue was swollen. Pt denied swollen tongue. Pt trying to rest with eyes closed. Slightly more lethargic/ tired acting than when pt came in. md alerted. Pt placed on O2 monitor and blood pressure cycling every 20 min.

## 2011-07-21 ENCOUNTER — Encounter (HOSPITAL_COMMUNITY): Payer: Self-pay | Admitting: *Deleted

## 2011-07-21 DIAGNOSIS — I10 Essential (primary) hypertension: Secondary | ICD-10-CM

## 2011-07-21 DIAGNOSIS — R112 Nausea with vomiting, unspecified: Secondary | ICD-10-CM

## 2011-07-21 DIAGNOSIS — K5669 Other intestinal obstruction: Secondary | ICD-10-CM

## 2011-07-21 DIAGNOSIS — E118 Type 2 diabetes mellitus with unspecified complications: Secondary | ICD-10-CM

## 2011-07-21 DIAGNOSIS — E1165 Type 2 diabetes mellitus with hyperglycemia: Secondary | ICD-10-CM

## 2011-07-21 DIAGNOSIS — IMO0002 Reserved for concepts with insufficient information to code with codable children: Secondary | ICD-10-CM

## 2011-07-21 LAB — COMPREHENSIVE METABOLIC PANEL
BUN: 23 mg/dL (ref 6–23)
CO2: 36 mEq/L — ABNORMAL HIGH (ref 19–32)
Chloride: 98 mEq/L (ref 96–112)
Creatinine, Ser: 0.71 mg/dL (ref 0.50–1.35)
GFR calc non Af Amer: >90 mL/min (ref >90)
Glucose, Bld: 100 mg/dL — ABNORMAL HIGH (ref 70–99)
Total Bilirubin: 0.5 mg/dL (ref 0.3–1.2)

## 2011-07-21 LAB — GLUCOSE, CAPILLARY: Glucose-Capillary: 89 mg/dL (ref 70–99)

## 2011-07-21 LAB — CBC
MCH: 28.1 pg (ref 26.0–34.0)
Platelets: 197 10*3/uL (ref 150–400)
RBC: 4.67 MIL/uL (ref 4.22–5.81)
WBC: 11.1 10*3/uL — ABNORMAL HIGH (ref 4.0–10.5)

## 2011-07-21 MED ORDER — INSULIN ASPART 100 UNIT/ML ~~LOC~~ SOLN
0.0000 [IU] | Freq: Three times a day (TID) | SUBCUTANEOUS | Status: DC
  Administered 2011-07-22: 3 [IU] via SUBCUTANEOUS
  Administered 2011-07-22 – 2011-07-24 (×3): 1 [IU] via SUBCUTANEOUS

## 2011-07-21 MED ORDER — HALOPERIDOL 5 MG PO TABS
5.0000 mg | ORAL_TABLET | Freq: Every day | ORAL | Status: DC
  Administered 2011-07-22 – 2011-07-30 (×9): 5 mg
  Filled 2011-07-21 (×10): qty 1

## 2011-07-21 MED ORDER — LISINOPRIL 20 MG PO TABS
20.0000 mg | ORAL_TABLET | Freq: Every day | ORAL | Status: DC
  Administered 2011-07-22 – 2011-07-30 (×9): 20 mg via NASOGASTRIC
  Filled 2011-07-21 (×9): qty 1

## 2011-07-21 MED ORDER — LACTULOSE 10 GM/15ML PO SOLN
30.0000 g | Freq: Two times a day (BID) | ORAL | Status: DC
  Administered 2011-07-21 – 2011-07-28 (×14): 30 g
  Filled 2011-07-21 (×15): qty 45

## 2011-07-21 MED ORDER — POTASSIUM CHLORIDE 10 MEQ/100ML IV SOLN
10.0000 meq | INTRAVENOUS | Status: AC
  Administered 2011-07-21 – 2011-07-22 (×6): 10 meq via INTRAVENOUS
  Filled 2011-07-21 (×6): qty 100

## 2011-07-21 MED ORDER — HALOPERIDOL 5 MG PO TABS
10.0000 mg | ORAL_TABLET | Freq: Every day | ORAL | Status: DC
  Administered 2011-07-21 – 2011-07-29 (×9): 10 mg
  Filled 2011-07-21 (×14): qty 2

## 2011-07-21 MED ORDER — CLOZAPINE 100 MG PO TABS
100.0000 mg | ORAL_TABLET | Freq: Two times a day (BID) | ORAL | Status: DC
  Administered 2011-07-22 – 2011-07-30 (×17): 100 mg via NASOGASTRIC
  Filled 2011-07-21 (×21): qty 1

## 2011-07-21 MED ORDER — ENOXAPARIN SODIUM 40 MG/0.4ML ~~LOC~~ SOLN
40.0000 mg | SUBCUTANEOUS | Status: DC
  Administered 2011-07-21 – 2011-07-30 (×10): 40 mg via SUBCUTANEOUS
  Filled 2011-07-21 (×9): qty 0.4

## 2011-07-21 MED ORDER — HYDROCHLOROTHIAZIDE 12.5 MG PO CAPS
12.5000 mg | ORAL_CAPSULE | Freq: Every morning | ORAL | Status: DC
  Administered 2011-07-22 – 2011-07-24 (×3): 12.5 mg
  Filled 2011-07-21 (×4): qty 1

## 2011-07-21 MED ORDER — BENZTROPINE MESYLATE 1 MG PO TABS
1.0000 mg | ORAL_TABLET | Freq: Two times a day (BID) | ORAL | Status: DC
  Administered 2011-07-21 – 2011-07-30 (×18): 1 mg via NASOGASTRIC
  Filled 2011-07-21 (×19): qty 1

## 2011-07-21 MED ORDER — CLOZAPINE 100 MG PO TABS
400.0000 mg | ORAL_TABLET | Freq: Every day | ORAL | Status: DC
  Administered 2011-07-21 – 2011-07-29 (×9): 400 mg via NASOGASTRIC
  Filled 2011-07-21 (×10): qty 4

## 2011-07-21 MED ORDER — SIMVASTATIN 5 MG PO TABS
5.0000 mg | ORAL_TABLET | Freq: Every day | ORAL | Status: DC
  Administered 2011-07-21 – 2011-07-29 (×9): 5 mg via NASOGASTRIC
  Filled 2011-07-21 (×10): qty 1

## 2011-07-21 NOTE — Progress Notes (Signed)
RX:  Brief Lovenox note Wt= 76 kg CrCl~103 ml/min  Adjust Lovenox to 40mg  SQ daily for DVT prophylaxis.  Lorenza Evangelist 07/21/2011 6:38 AM

## 2011-07-21 NOTE — Progress Notes (Signed)
Patient ID: Craig Bentley, male   DOB: [birth year], 56 y.o.   MRN: [id number]  Subjective: No events overnight. Patient denies chest pain, shortness of breath, abdominal pain.   Objective:  Vital signs in last 24 hours:  Filed Vitals:   07/21/11 0100 07/21/11 0515 07/21/11 0622 07/21/11 1432  BP: 149/76 119/85  131/74  Pulse: 88 79  88  Temp: 98.8 F (37.1 C) 98 F (36.7 C)  97.6 F (36.4 C)  TempSrc: Oral Oral  Axillary  Resp: 17 16  17   Height: 5\' 9"  (1.753 m)     Weight:   76.114 kg (167 lb 12.8 oz)   SpO2: 92% 100%  96%    Intake/Output from previous day:   Intake/Output Summary (Last 24 hours) at 07/21/11 1942 Last data filed at 07/21/11 0755  Gross per 24 hour  Intake   1295 ml  Output   1050 ml  Net    245 ml    Physical Exam: General: Alert, awake, oriented x3, in no acute distress. HEENT: No bruits, no goiter. Moist mucous membranes, no scleral icterus, no conjunctival pallor. NGT in place, NG output ~ 700 cc, bilious Heart: Regular rate and rhythm, S1/S2 +, no murmurs, rubs, gallops. Lungs: Clear to auscultation bilaterally. No wheezing, no rhonchi, no rales.  Abdomen: Soft, nontender, nondistended, positive bowel sounds. Extremities: No clubbing or cyanosis, no pitting edema,  positive pedal pulses. Neuro: Grossly nonfocal.  Lab Results:  Lab 07/21/11 0505 07/20/11 1907 07/19/11 1119  WBC 11.1* 13.4* 14.6*  HGB 13.1 13.9 15.2  HCT 41.0 42.7 46.6  PLT 197 229 231   Lab 07/21/11 0505 07/20/11 2315 07/20/11 1907 07/19/11 1235  NA 142 142 141 140  K 3.4* 3.3* 3.1* 4.2  CL 98 96 95* 101  CO2 36* 35* 34* 26  GLUCOSE 100* 134* 128* 130*  BUN 23 24* 25* 20  CREATININE 0.71 0.74 0.68 0.66  CALCIUM 9.1 9.3 9.4 9.2  MG -- 2.0 -- --    Lab 07/20/11 2315  INR 0.96  PROTIME --   Studies/Results: Ct Abdomen Pelvis W Contrast 07/20/2011   IMPRESSION:   1.  SBO with subsequent gastric and duodenal distention with air and fluid.  Two adjacent transition  points in the right abdomen (images 46 and 49) with decompressed ileum.  2.  Colon is chronically redundant and distended with stool.  3.  Trace free fluid in the left pericolic gutter and pelvis likely is reactive.  4.  Indeterminate bilateral adrenal nodules, on the left 17 x 20 mm.  Given the density on this study, follow-up nonemergent adrenal mass protocol CT may best characterize further.  5.  Chronic left iliac artery occlusion.  6.  Chronic lung base disease.    Medications: Scheduled Meds:   . sodium chloride  1,000 mL Intravenous Once  . aspirin EC  81 mg Oral Daily  . benztropine  1 mg Oral BID  . cloZAPine  100 mg Oral BID WC  . cloZAPine  400 mg Oral QHS  . docusate sodium  100 mg Oral BID  . enoxaparin  40 mg Subcutaneous Q24H  . haloperidol  10 mg Oral QHS  . haloperidol  5 mg Oral Daily  . hydrochlorothiazide  12.5 mg Oral q morning - 10a  . lactulose  30 g Oral BID  . lidocaine      . lisinopril  20 mg Oral Daily  . metFORMIN  500 mg Oral BID WC  . mulitivitamin  with minerals  1 tablet Oral Daily  . niacin  500 mg Oral QHS  . potassium chloride  10 mEq Intravenous Q1 Hr x 2  . potassium chloride  10 mEq Intravenous Q1 Hr x 6  . simvastatin  5 mg Oral QHS  . sodium chloride  3 mL Intravenous Q12H  . DISCONTD: enoxaparin  30 mg Subcutaneous Q24H   Continuous Infusions:   . sodium chloride 75 mL/hr at 07/21/11 0401  . DISCONTD: sodium chloride 1,000 mL (07/20/11 2050)   PRN Meds:.albuterol, bisacodyl, guaiFENesin, HYDROmorphone (DILAUDID) injection, iohexol, ondansetron (ZOFRAN) IV, ondansetron  Assessment/Plan:  Principal Problem:  *Partial small bowel obstruction - pt clinically improving with current supportive care - will continue NGT for now as bilious drain still present - abdomen in now considerably less distended and soft - will continue antiemetics as needed and will change medications to per tube  Active Problems:  Leukocytosis - likely  secondary to primary problem - WBC now trending down - CBC in AM   Hypokalemia - most likely secondary to vomiting - now mild and will supplement as indicated - BMP in AM   Hypertension, uncontrolled - will continue current medication regimen - monitor vitals per floor protocol   Chronic respiratory failure secondary to COPD (chronic obstructive pulmonary disease) - this appears to be clinically stable - pt is maintaining oxygen saturations > 95%   Diabetes mellitus, uncontrolled and with complications, Gastroparesis - follow upon A1C - continue Insulin SSI and hold off on Metformin as pt unable to tolerate PO   Schizophrenia - clinically stable   EDUCATION - test results and diagnostic studies were discussed with patient  - patient verbalized the understanding - questions were answered at the bedside and contact information was provided for additional questions or concerns   LOS: 1 day   MAGICK-Effie Wahlert 07/21/2011, 7:42 PM  [facility] Pager: [phone]

## 2011-07-22 ENCOUNTER — Inpatient Hospital Stay (HOSPITAL_COMMUNITY): Payer: Medicaid Other

## 2011-07-22 DIAGNOSIS — K5669 Other intestinal obstruction: Secondary | ICD-10-CM

## 2011-07-22 DIAGNOSIS — E118 Type 2 diabetes mellitus with unspecified complications: Secondary | ICD-10-CM

## 2011-07-22 DIAGNOSIS — R112 Nausea with vomiting, unspecified: Secondary | ICD-10-CM

## 2011-07-22 DIAGNOSIS — I10 Essential (primary) hypertension: Secondary | ICD-10-CM

## 2011-07-22 DIAGNOSIS — E1165 Type 2 diabetes mellitus with hyperglycemia: Secondary | ICD-10-CM

## 2011-07-22 LAB — CBC
MCH: 27.3 pg (ref 26.0–34.0)
MCHC: 30.5 g/dL (ref 30.0–36.0)
Platelets: 201 10*3/uL (ref 150–400)
RBC: 4.83 MIL/uL (ref 4.22–5.81)

## 2011-07-22 LAB — BASIC METABOLIC PANEL
Calcium: 9.2 mg/dL (ref 8.4–10.5)
GFR calc non Af Amer: >90 mL/min (ref >90)
Potassium: 3.5 mEq/L (ref 3.5–5.1)
Sodium: 142 mEq/L (ref 135–145)

## 2011-07-22 LAB — GLUCOSE, CAPILLARY
Glucose-Capillary: 74 mg/dL (ref 70–99)
Glucose-Capillary: 240 mg/dL — ABNORMAL HIGH (ref 70–99)

## 2011-07-22 MED ORDER — IOHEXOL 300 MG/ML  SOLN
100.0000 mL | Freq: Once | INTRAMUSCULAR | Status: AC | PRN
  Administered 2011-07-22: 100 mL via INTRAVENOUS

## 2011-07-22 MED ORDER — GI COCKTAIL ~~LOC~~
30.0000 mL | Freq: Three times a day (TID) | ORAL | Status: DC | PRN
  Filled 2011-07-22: qty 30

## 2011-07-22 NOTE — Progress Notes (Signed)
Patient ID: Craig Bentley, male   DOB: 01-Oct-1955, 56 y.o.   MRN: [id number]  Subjective: No events overnight. Patient denies chest pain, shortness of breath, abdominal pain.   Objective:  Vital signs in last 24 hours:  Filed Vitals:   07/21/11 1432 07/21/11 2055 07/22/11 0535 07/22/11 1412  BP: 131/74 118/69 132/78 167/83  Pulse: 88 65 74 101  Temp: 97.6 F (36.4 C) 97.9 F (36.6 C) 98.2 F (36.8 C) 99.6 F (37.6 C)  TempSrc: Axillary Oral Oral Oral  Resp: 17 18 18 18   Height:      Weight:   74.526 kg (164 lb 4.8 oz)   SpO2: 96% 98% 99% 98%    Intake/Output from previous day:   Intake/Output Summary (Last 24 hours) at 07/22/11 1729 Last data filed at 07/22/11 1500  Gross per 24 hour  Intake 2662.5 ml  Output   5650 ml  Net -2987.5 ml    Physical Exam: General: Alert, awake, oriented x3, in no acute distress. HEENT: No bruits, no goiter. Moist mucous membranes, no scleral icterus, no conjunctival pallor. NGT in place, NG output > 500 cc Heart: Regular rate and rhythm, S1/S2 +, no murmurs, rubs, gallops. Lungs: Clear to auscultation bilaterally. No wheezing, no rhonchi, no rales.  Abdomen: Soft, nontender, nondistended, positive bowel sounds. Extremities: No clubbing or cyanosis, no pitting edema,  positive pedal pulses. Neuro: Grossly nonfocal.  Lab Results:  Lab 07/22/11 0425 07/21/11 0505 07/20/11 1907 07/19/11 1119  WBC 9.2 11.1* 13.4* 14.6*  HGB 13.2 13.1 13.9 15.2  HCT 43.3 41.0 42.7 46.6  PLT 201 197 229 231   Lab 07/22/11 0425 07/21/11 0505 07/20/11 2315 07/20/11 1907 07/19/11 1235  NA 142 142 142 141 140  K 3.5 3.4* 3.3* 3.1* 4.2  CL 99 98 96 95* 101  CO2 34* 36* 35* 34* 26  GLUCOSE 73 100* 134* 128* 130*  BUN 25* 23 24* 25* 20  CREATININE 0.88 0.71 0.74 0.68 0.66  CALCIUM 9.2 9.1 9.3 9.4 9.2    Lab 07/20/11 2315  INR 0.96  PROTIME --   Studies/Results:  Ct Abdomen Pelvis W Contrast 07/20/2011     IMPRESSION:   1.  Small bowel obstruction  with subsequent gastric and duodenal distention with air and fluid.  Two adjacent transition points in the right abdomen with decompressed ileum.  2.  Colon is chronically redundant and distended with stool.  3.  Trace free fluid in the left pericolic gutter and pelvis likely is reactive.  4.  Indeterminate bilateral adrenal nodules, on the left 17 x 20 mm.  Given the density on this study, follow-up nonemergent adrenal mass protocol CT may best characterize further.  5.  Chronic left iliac artery occlusion.  6.  Chronic lung base disease.    Medications: Scheduled Meds:   . aspirin EC  81 mg Oral Daily  . benztropine  1 mg Per NG tube BID  . cloZAPine  100 mg Per NG tube BID WC  . cloZAPine  400 mg Per NG tube QHS  . docusate sodium  100 mg Oral BID  . enoxaparin  40 mg Subcutaneous Q24H  . haloperidol  10 mg Per Tube QHS  . haloperidol  5 mg Per Tube Daily  . hydrochlorothiazide  12.5 mg Per Tube q morning - 10a  . insulin aspart  0-9 Units Subcutaneous TID WC  . lactulose  30 g Per Tube BID  . lisinopril  20 mg Per NG tube Daily  .  potassium chloride  10 mEq Intravenous Q1 Hr x 6  . simvastatin  5 mg Per NG tube QHS   Continuous Infusions:   . sodium chloride 75 mL/hr at 07/22/11 0623   PRN Meds:.albuterol, bisacodyl, guaiFENesin, HYDROmorphone (DILAUDID) injection, ondansetron (ZOFRAN) IV, ondansetron  Assessment/Plan:  Principal Problem:  *Partial small bowel obstruction  - pt clinically improving with current supportive care  - will continue NGT for now as bilious drain still present  - abdomen in now considerably less distended and soft  - will continue antiemetics as needed and will change medications to per tube  - obtain follow up abdominal CT  Active Problems:  Leukocytosis  - likely secondary to primary problem  - WBC now trending down and today within normal limits - CBC in AM   Hypokalemia  - most likely secondary to vomiting  - now resolved - BMP in AM    Hypertension, uncontrolled  - will continue current medication regimen  - monitor vitals per floor protocol   Chronic respiratory failure secondary to COPD (chronic obstructive pulmonary disease)  - this appears to be clinically stable  - pt is maintaining oxygen saturations > 95%   Diabetes mellitus, uncontrolled and with complications, Gastroparesis  - follow upon A1C  - continue Insulin SSI and hold off on Metformin as pt unable to tolerate PO   Schizophrenia  - clinically stable   EDUCATION  - test results and diagnostic studies were discussed with patient  - patient verbalized the understanding  - questions were answered at the bedside and contact information was provided for additional questions or concerns    LOS: 2 days   MAGICK-Latronda Spink 07/22/2011, 5:29 PM  [facility] Pager: [phone]

## 2011-07-23 DIAGNOSIS — E118 Type 2 diabetes mellitus with unspecified complications: Secondary | ICD-10-CM

## 2011-07-23 DIAGNOSIS — K56609 Unspecified intestinal obstruction, unspecified as to partial versus complete obstruction: Secondary | ICD-10-CM

## 2011-07-23 DIAGNOSIS — K5669 Other intestinal obstruction: Secondary | ICD-10-CM

## 2011-07-23 DIAGNOSIS — IMO0002 Reserved for concepts with insufficient information to code with codable children: Secondary | ICD-10-CM

## 2011-07-23 DIAGNOSIS — I10 Essential (primary) hypertension: Secondary | ICD-10-CM

## 2011-07-23 DIAGNOSIS — E1165 Type 2 diabetes mellitus with hyperglycemia: Secondary | ICD-10-CM

## 2011-07-23 DIAGNOSIS — R112 Nausea with vomiting, unspecified: Secondary | ICD-10-CM

## 2011-07-23 LAB — GLUCOSE, CAPILLARY
Glucose-Capillary: 115 mg/dL — ABNORMAL HIGH (ref 70–99)
Glucose-Capillary: 120 mg/dL — ABNORMAL HIGH (ref 70–99)

## 2011-07-23 LAB — CBC
HCT: 45.3 % (ref 39.0–52.0)
MCH: 27.7 pg (ref 26.0–34.0)
MCHC: 31.8 g/dL (ref 30.0–36.0)
MCV: 87.1 fL (ref 78.0–100.0)
RDW: 12.7 % (ref 11.5–15.5)

## 2011-07-23 LAB — COMPREHENSIVE METABOLIC PANEL
Albumin: 3.5 g/dL (ref 3.5–5.2)
BUN: 20 mg/dL (ref 6–23)
Calcium: 9.9 mg/dL (ref 8.4–10.5)
Creatinine, Ser: 0.89 mg/dL (ref 0.50–1.35)
Total Bilirubin: 0.4 mg/dL (ref 0.3–1.2)
Total Protein: 7.1 g/dL (ref 6.0–8.3)

## 2011-07-23 MED ORDER — FLEET ENEMA 7-19 GM/118ML RE ENEM
1.0000 | ENEMA | Freq: Three times a day (TID) | RECTAL | Status: DC
  Administered 2011-07-23 – 2011-07-28 (×11): 1 via RECTAL
  Filled 2011-07-23 (×11): qty 1

## 2011-07-23 MED ORDER — BISACODYL 10 MG RE SUPP
10.0000 mg | Freq: Every day | RECTAL | Status: DC
  Administered 2011-07-23 – 2011-07-28 (×5): 10 mg via RECTAL
  Filled 2011-07-23 (×6): qty 1

## 2011-07-23 MED ORDER — BISACODYL 10 MG RE SUPP: 10.0000 mg | Freq: Every day | RECTAL | Status: DC

## 2011-07-23 MED ORDER — POLYETHYLENE GLYCOL 3350 17 G PO PACK
17.0000 g | PACK | Freq: Every day | ORAL | Status: DC
  Administered 2011-07-23 – 2011-07-30 (×7): 17 g via ORAL
  Filled 2011-07-23 (×8): qty 1

## 2011-07-23 MED ORDER — FLEET ENEMA 7-19 GM/118ML RE ENEM
1.0000 | ENEMA | RECTAL | Status: AC
  Administered 2011-07-23: 1 via RECTAL
  Filled 2011-07-23: qty 1

## 2011-07-23 MED ORDER — MAGNESIUM CITRATE PO SOLN
1.0000 | Freq: Once | ORAL | Status: AC
  Administered 2011-07-23: 1

## 2011-07-23 NOTE — Progress Notes (Signed)
Patient ID: Craig Bentley, male   DOB: [birth year], 56 y.o.   MRN: [id number]  Subjective: No events overnight. Patient denies chest pain, shortness of breath. Pt does report mild epigastric discomfort.  Objective:  Vital signs in last 24 hours:  Filed Vitals:   07/22/11 0535 07/22/11 1412 07/22/11 2108 07/23/11 0558  BP: 132/78 167/83 138/71 121/81  Pulse: 74 101 97 91  Temp: 98.2 F (36.8 C) 99.6 F (37.6 C) 98.4 F (36.9 C) 99.1 F (37.3 C)  TempSrc: Oral Oral Oral Oral  Resp: 18 18 18 18   Height:      Weight: 74.526 kg (164 lb 4.8 oz)   72.485 kg (159 lb 12.8 oz)  SpO2: 99% 98% 93% 97%    Intake/Output from previous day:   Intake/Output Summary (Last 24 hours) at 07/23/11 0946 Last data filed at 07/23/11 0600  Gross per 24 hour  Intake   2375 ml  Output   6951 ml  Net  -4576 ml    Physical Exam: General: Alert, awake, oriented x3, in no acute distress. HEENT: No bruits, no goiter. Moist mucous membranes, no scleral icterus, no conjunctival pallor.NGT in place with output ~ 300 cc Heart: Regular rate and rhythm, S1/S2 +, no murmurs, rubs, gallops. Lungs: Clear to auscultation bilaterally with minimal bilateral crackles. No wheezing, no rhonchi, no rales.  Abdomen: Soft, tender in epigastric area, mildly distended but improved since admission, positive bowel sounds. Extremities: No clubbing or cyanosis, no pitting edema,  positive pedal pulses. Neuro: Grossly nonfocal.  Lab Results:  Lab 07/23/11 0430 07/22/11 0425 07/21/11 0505 07/20/11 1907 07/19/11 1119  WBC 10.3 9.2 11.1* 13.4* 14.6*  HGB 14.4 13.2 13.1 13.9 15.2  HCT 45.3 43.3 41.0 42.7 46.6  PLT 206 201 197 229 231   Lab 07/23/11 0430 07/22/11 0425 07/21/11 0505 07/20/11 2315 07/20/11 1907  NA 141 142 142 142 141  K 3.6 3.5 3.4* 3.3* 3.1*  CL 95* 99 98 96 95*  CO2 35* 34* 36* 35* 34*  GLUCOSE 109* 73 100* 134* 128*  BUN 20 25* 23 24* 25*  CREATININE 0.89 0.88 0.71 0.74 0.68  CALCIUM 9.9 9.2 9.1 9.3 9.4     Lab 07/20/11 2315  INR 0.96  PROTIME --    Studies/Results:  Ct Abdomen Pelvis W Contrast  07/20/2011  IMPRESSION:  1. Small bowel obstruction with subsequent gastric and duodenal distention with air and fluid. Two adjacent transition points in the right abdomen with decompressed ileum.  2. Colon is chronically redundant and distended with stool.  3. Trace free fluid in the left pericolic gutter and pelvis likely is reactive.  4. Indeterminate bilateral adrenal nodules, on the left 17 x 20 mm. Given the density on this study, follow-up nonemergent adrenal mass protocol CT may best characterize further.  5. Chronic left iliac artery occlusion.  6. Chronic lung base disease  Ct Abdomen Pelvis W Contrast  07/22/2011  IMPRESSION:  1. Mildly progressive small bowel obstruction.  2. Partial decompression of the stomach following nasogastric tube placement.  3. Continued prominent stool in redundant colon.  4. Stable adrenal masses.  5. COPD.  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  Medications: Scheduled Meds:   . aspirin EC  81 mg Oral Daily  . benztropine  1 mg Per NG tube BID  . cloZAPine  100 mg Per NG tube BID WC  . cloZAPine  400 mg Per NG tube QHS  . docusate sodium  100 mg Oral BID  .  enoxaparin  40 mg Subcutaneous Q24H  . haloperidol  10 mg Per Tube QHS  . haloperidol  5 mg Per Tube Daily  . hydrochlorothiazide  12.5 mg Per Tube q morning - 10a  . insulin aspart  0-9 Units Subcutaneous TID WC  . lactulose  30 g Per Tube BID  . lisinopril  20 mg Per NG tube Daily  . simvastatin  5 mg Per NG tube QHS   Continuous Infusions:   . sodium chloride 75 mL/hr at 07/22/11 0623   PRN Meds:.albuterol, bisacodyl, gi cocktail, guaiFENesin, HYDROmorphone (DILAUDID) injection, iohexol, ondansetron (ZOFRAN) IV, ondansetron  Assessment/Plan:  Principal Problem:  *Partial small bowel obstruction  - pt slowly clinically improving but still has persistent abdominal  pain - he has not had bowel movement since admission - will continue NGT for now as drain still present but mor yellow this AM - will continue antiemetics as needed - follow up imaging show some decompression but progressive SBO - will be more aggressive with bowel regime, fleet enema to give now and change bisacodyl to scheduled and add Miralax QD  Active Problems:  Leukocytosis  - likely secondary to primary problem  - WBC within normal limits  - CBC in AM   Hypokalemia  - most likely secondary to vomiting  - now resolved  - BMP in AM   Hypertension, uncontrolled  - will continue current medication regimen  - monitor vitals per floor protocol   Chronic respiratory failure secondary to COPD (chronic obstructive pulmonary disease)  - this appears to be clinically stable  - pt is maintaining oxygen saturations > 95%   Diabetes mellitus, uncontrolled and with complications, Gastroparesis  - follow upon A1C  - continue Insulin SSI and hold off on Metformin as pt unable to tolerate PO   Schizophrenia  - clinically stable   EDUCATION  - test results and diagnostic studies were discussed with patient  - patient verbalized the understanding  - questions were answered at the bedside and contact information was provided for additional questions or concerns    LOS: 3 days   MAGICK-Natan Hartog 07/23/2011, 9:46 AM  [facility] Pager: [phone]

## 2011-07-23 NOTE — Consult Note (Signed)
Reason for Consult:small bowel obstruction Referring Physician: Dr. Danie Binder  Craig Bentley is an 56 y.o. male.  HPI: this is a pleasant gentleman who was admitted on May 28 after he developed abdominal pain nausea and vomiting. Since admission he has had no improvement with his distention or ability to move his bowels. His pain has been relieved since nasogastric tube is in place. He now has had 2 CAT scans of the abdomen and pelvis showing a large amount of stool in his colon but also a progression of small bowel dilation and potential obstruction. Again, currently, he is comfortable and denies abdominal pain. He has no previous surgical history on the abdomen. He has no previous history of  Bowel obstruction. He does have a history of fecal impaction.  Past Medical History  Diagnosis Date  . Hypertension   . Diabetes mellitus   . Schizophrenia, schizo-affective   . COPD (chronic obstructive pulmonary disease)   . Impacted fece     History reviewed. No pertinent past surgical history.  History reviewed. No pertinent family history.  Social History:  reports that he has been smoking.  He does not have any smokeless tobacco history on file. He reports that he drinks alcohol. He reports that he does not use illicit drugs.  Allergies: No Known Allergies  Medications: I have reviewed the patient's current medications.  Results for orders placed during the hospital encounter of 07/20/11 (from the past 48 hour(s))  CBC     Status: Normal   Collection Time   07/22/11  4:25 AM      Component Value Range Comment   WBC 9.2  4.0 - 10.5 (K/uL)    RBC 4.83  4.22 - 5.81 (MIL/uL)    Hemoglobin 13.2  13.0 - 17.0 (g/dL)    HCT 16.1  09.6 - 04.5 (%)    MCV 89.6  78.0 - 100.0 (fL)    MCH 27.3  26.0 - 34.0 (pg)    MCHC 30.5  30.0 - 36.0 (g/dL)    RDW 40.9  81.1 - 91.4 (%)    Platelets 201  150 - 400 (K/uL)   BASIC METABOLIC PANEL     Status: Abnormal   Collection Time   07/22/11  4:25 AM   Component Value Range Comment   Sodium 142  135 - 145 (mEq/L)    Potassium 3.5  3.5 - 5.1 (mEq/L)    Chloride 99  96 - 112 (mEq/L)    CO2 34 (*) 19 - 32 (mEq/L)    Glucose, Bld 73  70 - 99 (mg/dL)    BUN 25 (*) 6 - 23 (mg/dL)    Creatinine, Ser 7.82  0.50 - 1.35 (mg/dL)    Calcium 9.2  8.4 - 10.5 (mg/dL)    GFR calc non Af Amer >90  >90 (mL/min)    GFR calc Af Amer >90  >90 (mL/min)   GLUCOSE, CAPILLARY     Status: Normal   Collection Time   07/22/11  7:23 AM      Component Value Range Comment   Glucose-Capillary 74  70 - 99 (mg/dL)    Comment 1 Notify RN     GLUCOSE, CAPILLARY     Status: Abnormal   Collection Time   07/22/11 10:53 AM      Component Value Range Comment   Glucose-Capillary 240 (*) 70 - 99 (mg/dL)   GLUCOSE, CAPILLARY     Status: Abnormal   Collection Time   07/22/11  4:57  PM      Component Value Range Comment   Glucose-Capillary 126 (*) 70 - 99 (mg/dL)    Comment 1 Notify RN     GLUCOSE, CAPILLARY     Status: Abnormal   Collection Time   07/22/11  9:53 PM      Component Value Range Comment   Glucose-Capillary 172 (*) 70 - 99 (mg/dL)    Comment 1 Documented in Chart      Comment 2 Notify RN     CBC     Status: Normal   Collection Time   07/23/11  4:30 AM      Component Value Range Comment   WBC 10.3  4.0 - 10.5 (K/uL)    RBC 5.20  4.22 - 5.81 (MIL/uL)    Hemoglobin 14.4  13.0 - 17.0 (g/dL)    HCT 16.1  09.6 - 04.5 (%)    MCV 87.1  78.0 - 100.0 (fL)    MCH 27.7  26.0 - 34.0 (pg)    MCHC 31.8  30.0 - 36.0 (g/dL)    RDW 40.9  81.1 - 91.4 (%)    Platelets 206  150 - 400 (K/uL)   COMPREHENSIVE METABOLIC PANEL     Status: Abnormal   Collection Time   07/23/11  4:30 AM      Component Value Range Comment   Sodium 141  135 - 145 (mEq/L)    Potassium 3.6  3.5 - 5.1 (mEq/L)    Chloride 95 (*) 96 - 112 (mEq/L)    CO2 35 (*) 19 - 32 (mEq/L)    Glucose, Bld 109 (*) 70 - 99 (mg/dL)    BUN 20  6 - 23 (mg/dL)    Creatinine, Ser 7.82  0.50 - 1.35 (mg/dL)    Calcium  9.9  8.4 - 10.5 (mg/dL)    Total Protein 7.1  6.0 - 8.3 (g/dL)    Albumin 3.5  3.5 - 5.2 (g/dL)    AST 16  0 - 37 (U/L)    ALT 14  0 - 53 (U/L)    Alkaline Phosphatase 68  39 - 117 (U/L)    Total Bilirubin 0.4  0.3 - 1.2 (mg/dL)    GFR calc non Af Amer >90  >90 (mL/min)    GFR calc Af Amer >90  >90 (mL/min)   GLUCOSE, CAPILLARY     Status: Abnormal   Collection Time   07/23/11  7:50 AM      Component Value Range Comment   Glucose-Capillary 107 (*) 70 - 99 (mg/dL)   GLUCOSE, CAPILLARY     Status: Abnormal   Collection Time   07/23/11 11:50 AM      Component Value Range Comment   Glucose-Capillary 115 (*) 70 - 99 (mg/dL)    Comment 1 Notify RN     GLUCOSE, CAPILLARY     Status: Abnormal   Collection Time   07/23/11  5:17 PM      Component Value Range Comment   Glucose-Capillary 127 (*) 70 - 99 (mg/dL)    Comment 1 Notify RN       Ct Abdomen Pelvis W Contrast  07/22/2011  *RADIOLOGY REPORT*  Clinical Data: Follow-up small bowel obstruction, clinically improving.  CT ABDOMEN AND PELVIS WITH CONTRAST  Technique:  Multidetector CT imaging of the abdomen and pelvis was performed following the standard protocol during bolus administration of intravenous contrast.  Contrast: OMNIPAQUE IOHEXOL 300 MG/ML  SOLN  Comparison: 07/20/2011.  Findings: Interval nasogastric  tube with its tip in the mid to distal stomach.  The stomach is dilated, with improvement.  Mildly progressive proximal small bowel dilatation with continued normal caliber distal small bowel loops.  Prominent stool is again demonstrated in the colon.  Normal appearing appendix.  Poorly distended gallbladder with mild diffuse wall enhancement without abnormal wall thickening. Unremarkable liver, spleen, pancreas, kidneys and urinary bladder. Normal sized prostate gland containing a coarse calcification.  The previously seen free peritoneal fluid in the pelvis is not currently visualized.  Previously described bilateral adrenal masses  are unchanged. Previously noted chronic left external iliac occlusion.  No enlarged lymph nodes.  Bullous changes are again noted at the lung bases.  Mild lumbar and lower thoracic spine degenerative changes.  IMPRESSION:  1.  Mildly progressive small bowel obstruction. 2.  Partial decompression of the stomach following nasogastric tube placement. 3.  Continued prominent stool in redundant colon. 4.  Stable adrenal masses. 5.  COPD. 6.  Chronic left external iliac artery occlusion.  Original Report Authenticated By: Darrol Angel, M.D.    Review of Systems  Constitutional: Negative.   HENT: Negative.   Eyes: Negative.   Respiratory: Negative.   Cardiovascular: Negative.   Gastrointestinal: Positive for nausea, vomiting, abdominal pain and constipation.  Genitourinary: Negative.   Musculoskeletal: Negative.   Skin: Negative.   Neurological: Negative.   Endo/Heme/Allergies: Negative.   Psychiatric/Behavioral:       Schizophrenia   Blood pressure 115/68, pulse 84, temperature 98.6 F (37 C), temperature source Oral, resp. rate 18, height 5\' 9"  (1.753 m), weight 159 lb 12.8 oz (72.485 kg), SpO2 92.00%. Physical Exam  Constitutional: He is oriented to person, place, and time. He appears well-developed and well-nourished. No distress.       He is comfortable, lying in bed with a nasogastric tube in place.  HENT:  Head: Normocephalic and atraumatic.  Right Ear: External ear normal.  Left Ear: External ear normal.  Nose: Nose normal.  Mouth/Throat: Oropharynx is clear and moist. No oropharyngeal exudate.  Eyes: Conjunctivae are normal. Pupils are equal, round, and reactive to light. Right eye exhibits no discharge. Left eye exhibits no discharge. No scleral icterus.  Neck: Normal range of motion. Neck supple. No tracheal deviation present. No thyromegaly present.  Cardiovascular: Normal rate, regular rhythm, normal heart sounds and intact distal pulses.   No murmur heard. Respiratory:  Effort normal and breath sounds normal. No respiratory distress. He has no wheezes. He has no rales.  GI: Soft. He exhibits distension. He exhibits no mass. There is no tenderness. There is no rebound and no guarding.       There are no hernias  Musculoskeletal: Normal range of motion. He exhibits no edema and no tenderness.  Lymphadenopathy:    He has no cervical adenopathy.  Neurological: He is alert and oriented to person, place, and time. No cranial nerve deficit. Coordination normal.  Skin: Skin is warm and dry. No rash noted. No erythema.  Psychiatric: His behavior is normal. Judgment normal.    Assessment/Plan: Small bowel obstruction and obstipation  I am uncertain of the cause of his obstruction. He has no previous surgical history. There is dilated proximal small bowel with collapsed distal on the CAT scan. He is moderately dilated but he is also obstipated. He does not have an acute abdomen. We will proceed conservatively with continued bowel rest and nasogastric suctioning. I am going to try to improve his constipation with magnesium citrate down his nasogastric tube  and repeat enemas. If he does not improve, he may need a Gastrografin enema and even exploratory laparotomy. We will follow him closely with you.  Gregorey Nabor A 07/23/2011, 6:07 PM

## 2011-07-24 DIAGNOSIS — R112 Nausea with vomiting, unspecified: Secondary | ICD-10-CM

## 2011-07-24 DIAGNOSIS — E1165 Type 2 diabetes mellitus with hyperglycemia: Secondary | ICD-10-CM

## 2011-07-24 DIAGNOSIS — E118 Type 2 diabetes mellitus with unspecified complications: Secondary | ICD-10-CM

## 2011-07-24 DIAGNOSIS — K5669 Other intestinal obstruction: Secondary | ICD-10-CM

## 2011-07-24 DIAGNOSIS — I10 Essential (primary) hypertension: Secondary | ICD-10-CM

## 2011-07-24 LAB — CBC
Hemoglobin: 13.8 g/dL (ref 13.0–17.0)
RBC: 4.89 MIL/uL (ref 4.22–5.81)

## 2011-07-24 LAB — BASIC METABOLIC PANEL
CO2: 37 mEq/L — ABNORMAL HIGH (ref 19–32)
Glucose, Bld: 121 mg/dL — ABNORMAL HIGH (ref 70–99)
Potassium: 2.9 mEq/L — ABNORMAL LOW (ref 3.5–5.1)
Sodium: 141 mEq/L (ref 135–145)

## 2011-07-24 LAB — GLUCOSE, CAPILLARY: Glucose-Capillary: 123 mg/dL — ABNORMAL HIGH (ref 70–99)

## 2011-07-24 MED ORDER — POTASSIUM CHLORIDE 10 MEQ/100ML IV SOLN
10.0000 meq | INTRAVENOUS | Status: AC
  Administered 2011-07-24 (×6): 10 meq via INTRAVENOUS
  Filled 2011-07-24 (×6): qty 100

## 2011-07-24 NOTE — Progress Notes (Signed)
  Subjective: Comfortable Still no results from below.  Objective: Vital signs in last 24 hours: Temp:  [98.6 F (37 C)-99.4 F (37.4 C)] 99 F (37.2 C) (06/01 0605) Pulse Rate:  [84-109] 109  (06/01 0605) Resp:  [18] 18  (06/01 0605) BP: (90-115)/(63-72) 90/63 mmHg (06/01 0605) SpO2:  [92 %-95 %] 95 % (06/01 0605) Weight:  [161 lb 15.4 oz (73.465 kg)] 161 lb 15.4 oz (73.465 kg) (06/01 0605) Last BM Date: 07/23/11  Intake/Output from previous day: 05/31 0701 - 06/01 0700 In: 2422.8 [P.O.:240; I.V.:1886.8; NG/GT:296] Out: 2150 [Urine:1500; Emesis/NG output:650] Intake/Output this shift: Total I/O In: -  Out: 1200 [Emesis/NG output:1200]  Abdomen softer today, non tender NG drainage remains bilous  Lab Results:   Basename 07/24/11 0620 07/23/11 0430  WBC 11.8* 10.3  HGB 13.8 14.4  HCT 42.7 45.3  PLT 198 206   BMET  Basename 07/24/11 0620 07/23/11 0430  NA 141 141  K 2.9* 3.6  CL 94* 95*  CO2 37* 35*  GLUCOSE 121* 109*  BUN 22 20  CREATININE 0.98 0.89  CALCIUM 9.4 9.9   PT/INR No results found for this basename: LABPROT:2,INR:2 in the last 72 hours ABG No results found for this basename: PHART:2,PCO2:2,PO2:2,HCO3:2 in the last 72 hours  Studies/Results: Ct Abdomen Pelvis W Contrast  07/22/2011  *RADIOLOGY REPORT*  Clinical Data: Follow-up small bowel obstruction, clinically improving.  CT ABDOMEN AND PELVIS WITH CONTRAST  Technique:  Multidetector CT imaging of the abdomen and pelvis was performed following the standard protocol during bolus administration of intravenous contrast.  Contrast: OMNIPAQUE IOHEXOL 300 MG/ML  SOLN  Comparison: 07/20/2011.  Findings: Interval nasogastric tube with its tip in the mid to distal stomach.  The stomach is dilated, with improvement.  Mildly progressive proximal small bowel dilatation with continued normal caliber distal small bowel loops.  Prominent stool is again demonstrated in the colon.  Normal appearing appendix.   Poorly distended gallbladder with mild diffuse wall enhancement without abnormal wall thickening. Unremarkable liver, spleen, pancreas, kidneys and urinary bladder. Normal sized prostate gland containing a coarse calcification.  The previously seen free peritoneal fluid in the pelvis is not currently visualized.  Previously described bilateral adrenal masses are unchanged. Previously noted chronic left external iliac occlusion.  No enlarged lymph nodes.  Bullous changes are again noted at the lung bases.  Mild lumbar and lower thoracic spine degenerative changes.  IMPRESSION:  1.  Mildly progressive small bowel obstruction. 2.  Partial decompression of the stomach following nasogastric tube placement. 3.  Continued prominent stool in redundant colon. 4.  Stable adrenal masses. 5.  COPD. 6.  Chronic left external iliac artery occlusion.  Original Report Authenticated By: Darrol Angel, M.D.    Anti-infectives: Anti-infectives    None      Assessment/Plan: s/p * No surgery found *  Continue NG, enemas Repeat abd xrays in morning If no improvement exp lap soon after.  LOS: 4 days    Kenli Waldo A 07/24/2011

## 2011-07-24 NOTE — Progress Notes (Signed)
Patient ID: Craig Bentley, male   DOB: 09/19/1955, 56 y.o.   MRN: [id number]  Subjective: No events overnight. Patient denies chest pain, shortness of breath, abdominal pain. Did not have bowel movement.   Objective:  Vital signs in last 24 hours:  Filed Vitals:   07/23/11 0558 07/23/11 1359 07/23/11 2111 07/24/11 0605  BP: 121/81 115/68 115/72 90/63  Pulse: 91 84 108 109  Temp: 99.1 F (37.3 C) 98.6 F (37 C) 99.4 F (37.4 C) 99 F (37.2 C)  TempSrc: Oral Oral Oral Oral  Resp: 18 18 18 18   Height:      Weight: 72.485 kg (159 lb 12.8 oz)   73.465 kg (161 lb 15.4 oz)  SpO2: 97% 92% 93% 95%    Intake/Output from previous day:   Intake/Output Summary (Last 24 hours) at 07/24/11 0821 Last data filed at 07/24/11 0756  Gross per 24 hour  Intake 2422.75 ml  Output   3350 ml  Net -927.25 ml    Physical Exam: General: Alert, awake, oriented x3, in no acute distress. HEENT: No bruits, no goiter. Moist mucous membranes, no scleral icterus, no conjunctival pallor. NGT in place with bilious drain Heart: Regular rate and rhythm, S1/S2 +, no murmurs, rubs, gallops. Lungs: Clear to auscultation bilaterally. No wheezing, no rhonchi, no rales.  Abdomen: Soft, nontender, nondistended, positive bowel sounds. Extremities: No clubbing or cyanosis, no pitting edema,  positive pedal pulses. Neuro: Grossly nonfocal.  Lab Results:  Lab 07/24/11 0620 07/23/11 0430 07/22/11 0425 07/21/11 0505 07/20/11 1907  WBC 11.8* 10.3 9.2 11.1* 13.4*  HGB 13.8 14.4 13.2 13.1 13.9  HCT 42.7 45.3 43.3 41.0 42.7  PLT 198 206 201 197 229   Lab 07/24/11 0620 07/23/11 0430 07/22/11 0425 07/21/11 0505 07/20/11 2315  NA 141 141 142 142 142  K 2.9* 3.6 3.5 3.4* 3.3*  CL 94* 95* 99 98 96  CO2 37* 35* 34* 36* 35*  GLUCOSE 121* 109* 73 100* 134*  BUN 22 20 25* 23 24*  CREATININE 0.98 0.89 0.88 0.71 0.74  CALCIUM 9.4 9.9 9.2 9.1 9.3   Studies/Results:  Ct Abdomen Pelvis W Contrast  07/20/2011  IMPRESSION:    1. Small bowel obstruction with subsequent gastric and duodenal distention with air and fluid. Two adjacent transition points in the right abdomen with decompressed ileum.  2. Colon is chronically redundant and distended with stool.  3. Trace free fluid in the left pericolic gutter and pelvis likely is reactive.  4. Indeterminate bilateral adrenal nodules, on the left 17 x 20 mm. Given the density on this study, follow-up nonemergent adrenal mass protocol CT may best characterize further.  5. Chronic left iliac artery occlusion.  6. Chronic lung base disease   Ct Abdomen Pelvis W Contrast  07/22/2011  IMPRESSION:  1. Mildly progressive small bowel obstruction.  2. Partial decompression of the stomach following nasogastric tube placement.  3. Continued prominent stool in redundant colon.  4. Stable adrenal masses.  5. COPD.  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   Medications: Scheduled Meds:   . aspirin EC  81 mg Oral Daily  . benztropine  1 mg Per NG tube BID  . bisacodyl  10 mg Rectal Daily  . cloZAPine  100 mg Per NG tube BID WC  . cloZAPine  400 mg Per NG tube QHS  . docusate sodium  100 mg Oral BID  . enoxaparin  40 mg Subcutaneous Q24H  . haloperidol  10 mg Per Tube  QHS  . haloperidol  5 mg Per Tube Daily  . hydrochlorothiazide  12.5 mg Per Tube q morning - 10a  . insulin aspart  0-9 Units Subcutaneous TID WC  . lactulose  30 g Per Tube BID  . lisinopril  20 mg Per NG tube Daily  . magnesium citrate  1 Bottle Per Tube Once  . polyethylene glycol  17 g Oral Daily  . simvastatin  5 mg Per NG tube QHS  . sodium chloride  3 mL Intravenous Q12H  . sodium phosphate  1 enema Rectal STAT  . sodium phosphate  1 enema Rectal TID   Continuous Infusions:   . sodium chloride 75 mL/hr at 07/24/11 0247   PRN Meds:.albuterol, gi cocktail, guaiFENesin, HYDROmorphone (DILAUDID) injection, ondansetron (ZOFRAN) IV, ondansetron, DISCONTD:  bisacodyl  Assessment/Plan:  Principal Problem:  *Partial small bowel obstruction  - pt comfortable this AM but did not have bowel movement - will continue NGT for now as drain still present  - will continue antiemetics as needed  - follow up in surgery recommendations, appreciate input  Active Problems:  Leukocytosis  - likely secondary to primary problem  - WBC slightly up from yesterday - CBC in AM   Hypokalemia  - down this AM - will supplement by IV - BMP in AM   Hypertension, uncontrolled  - will continue current medication regimen  - monitor vitals per floor protocol   Chronic respiratory failure secondary to COPD (chronic obstructive pulmonary disease)  - this appears to be clinically stable  - pt is maintaining oxygen saturations > 95%   Diabetes mellitus, uncontrolled and with complications, Gastroparesis  - continue Insulin SSI and hold off on Metformin as pt unable to tolerate PO   Schizophrenia  - clinically stable   EDUCATION  - test results and diagnostic studies were discussed with patient  - patient verbalized the understanding  - questions were answered at the bedside and contact information was provided for additional questions or concerns    LOS: 4 days   MAGICK-Sueanne Maniaci 07/24/2011, 8:21 AM  [facility] Pager: [phone]

## 2011-07-25 LAB — CBC
HCT: 39.3 % (ref 39.0–52.0)
Hemoglobin: 12.4 g/dL — ABNORMAL LOW (ref 13.0–17.0)
MCH: 27.6 pg (ref 26.0–34.0)
MCHC: 31.6 g/dL (ref 30.0–36.0)
MCV: 87.5 fL (ref 78.0–100.0)
Platelets: 196 K/uL (ref 150–400)
RBC: 4.49 MIL/uL (ref 4.22–5.81)
RDW: 12.8 % (ref 11.5–15.5)
WBC: 9.4 K/uL (ref 4.0–10.5)

## 2011-07-25 LAB — BASIC METABOLIC PANEL
BUN: 17 mg/dL (ref 6–23)
Chloride: 102 mEq/L (ref 96–112)
Creatinine, Ser: 0.69 mg/dL (ref 0.50–1.35)
GFR calc Af Amer: >90 mL/min (ref >90)
Glucose, Bld: 81 mg/dL (ref 70–99)

## 2011-07-25 LAB — GLUCOSE, CAPILLARY: Glucose-Capillary: 123 mg/dL — ABNORMAL HIGH (ref 70–99)

## 2011-07-25 NOTE — Progress Notes (Addendum)
Patient ID: Craig Bentley, male   DOB: 1955/10/31, 56 y.o.   MRN: [id number]  Subjective: No events overnight. Patient denies chest pain, shortness of breath, abdominal pain.  Objective:  Vital signs in last 24 hours:  Filed Vitals:   07/24/11 0605 07/24/11 1356 07/24/11 2335 07/25/11 0632  BP: 90/63 116/61 132/84 147/84  Pulse: 109 91 89 96  Temp: 99 F (37.2 C) 98.8 F (37.1 C) 98.6 F (37 C) 98.1 F (36.7 C)  TempSrc: Oral Oral Oral Oral  Resp: 18 18 20 20   Height:      Weight: 73.465 kg (161 lb 15.4 oz)     SpO2: 95% 92% 93% 92%    Intake/Output from previous day:   Intake/Output Summary (Last 24 hours) at 07/25/11 0755 Last data filed at 07/25/11 6213  Gross per 24 hour  Intake      0 ml  Output   2000 ml  Net  -2000 ml    Physical Exam: General: Alert, awake, oriented x3, in no acute distress. HEENT: No bruits, no goiter. Moist mucous membranes, no scleral icterus, no conjunctival pallor. NGT in place with output bilious and > 800cc Heart: Regular rhythm, but tachycardic, S1/S2 +, no murmurs, rubs, gallops. Lungs: Clear to auscultation bilaterally. No wheezing, no rhonchi, no rales.  Abdomen: Soft, nontender, nondistended, positive bowel sounds. Extremities: No clubbing or cyanosis, no pitting edema,  positive pedal pulses. Neuro: Grossly nonfocal.  Lab Results:  Lab 07/25/11 0534 07/24/11 0620 07/23/11 0430 07/22/11 0425 07/21/11 0505  WBC 9.4 11.8* 10.3 9.2 11.1*  HGB 12.4* 13.8 14.4 13.2 13.1  HCT 39.3 42.7 45.3 43.3 41.0  PLT 196 198 206 201 197    Lab 07/25/11 0534 07/24/11 0620 07/23/11 0430 07/22/11 0425 07/21/11 0505  NA 138 141 141 142 142  K 3.5 2.9* 3.6 3.5 3.4*  CL 102 94* 95* 99 98  CO2 27 37* 35* 34* 36*  GLUCOSE 81 121* 109* 73 100*  BUN 17 22 20  25* 23  CREATININE 0.69 0.98 0.89 0.88 0.71  CALCIUM 8.9 9.4 9.9 9.2 9.1  MG -- -- -- -- 2.0   Studies/Results:  Ct Abdomen Pelvis W Contrast  07/20/2011  IMPRESSION:  1. Small bowel  obstruction with subsequent gastric and duodenal distention with air and fluid. Two adjacent transition points in the right abdomen with decompressed ileum.  2. Colon is chronically redundant and distended with stool.  3. Trace free fluid in the left pericolic gutter and pelvis likely is reactive.  4. Indeterminate bilateral adrenal nodules, on the left 17 x 20 mm. Given the density on this study, follow-up nonemergent adrenal mass protocol CT may best characterize further.  5. Chronic left iliac artery occlusion.  6. Chronic lung base disease   Ct Abdomen Pelvis W Contrast  07/22/2011  IMPRESSION:  1. Mildly progressive small bowel obstruction.  2. Partial decompression of the stomach following nasogastric tube placement.  3. Continued prominent stool in redundant colon.  4. Stable adrenal masses.  5. COPD.  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   Medications: Scheduled Meds:   . aspirin EC  81 mg Oral Daily  . benztropine  1 mg Per NG tube BID  . bisacodyl  10 mg Rectal Daily  . cloZAPine  100 mg Per NG tube BID WC  . cloZAPine  400 mg Per NG tube QHS  . docusate sodium  100 mg Oral BID  . enoxaparin  40 mg Subcutaneous Q24H  . haloperidol  10 mg Per Tube QHS  . haloperidol  5 mg Per Tube Daily  . hydrochlorothiazide  12.5 mg Per Tube q morning - 10a  . insulin aspart  0-9 Units Subcutaneous TID WC  . lactulose  30 g Per Tube BID  . lisinopril  20 mg Per NG tube Daily  . polyethylene glycol  17 g Oral Daily  . potassium chloride  10 mEq Intravenous Q1 Hr x 6  . simvastatin  5 mg Per NG tube QHS  . sodium chloride  3 mL Intravenous Q12H  . sodium phosphate  1 enema Rectal TID   Continuous Infusions:   . sodium chloride 75 mL/hr at 07/25/11 0141   PRN Meds:.albuterol, gi cocktail, guaiFENesin, HYDROmorphone (DILAUDID) injection, ondansetron (ZOFRAN) IV, ondansetron  Assessment/Plan:  Principal Problem:  *Partial small bowel obstruction  - pt comfortable this  AM but did not have bowel movement  - will continue NGT for now as drain still present  - will continue antiemetics as needed  - follow up in surgery recommendations, appreciate input   Active Problems:  Leukocytosis  - likely secondary to primary problem  - WBC within normal limits this AM - CBC in AM   Hypokalemia  - suppleemented - BMP in AM   Hypertension, uncontrolled  - will continue current medication regimen  - monitor vitals per floor protocol   Chronic respiratory failure secondary to COPD (chronic obstructive pulmonary disease)  - this appears to be clinically stable  - pt is maintaining oxygen saturations > 95%   Diabetes mellitus, uncontrolled and with complications, Gastroparesis  - continue Insulin SSI and hold off on Metformin as pt unable to tolerate PO   Schizophrenia  - clinically stable   EDUCATION  - test results and diagnostic studies were discussed with patient  - patient verbalized the understanding  - questions were answered at the bedside and contact information was provided for additional questions or concerns    LOS: 5 days   MAGICK-Maurita Havener 07/25/2011, 7:55 AM  [facility] Pager: [phone]

## 2011-07-25 NOTE — Progress Notes (Signed)
General Surgery Note  LOS: 5 days    Assessment/Plan: 1.  Partial small bowel obstruction, in virgin abdomen.  Last CT scan - 07/22/2011 - persist ant SBO, very redundant colon.  Plan:  KUB tomorrow AM, continue IVF and checking labs (lytes are better today), ambulate as much as possible.  2. History of Hypertension 3.  COPD (chronic obstructive pulmonary disease) () 4.  Diabetes mellitus    5.  Schizophrenia   Pleasant disposition. 6.  Chronic left ext iliac artery occlusion - seen on CT.  Subjective:  Alert, says he passed some flatus.  Wants a hamburger. Objective:   Filed Vitals:   07/25/11 0632  BP: 147/84  Pulse: 96  Temp: 98.1 F (36.7 C)  Resp: 20     Intake/Output from previous day:  06/01 0701 - 06/02 0700 In: -  Out: 2000 [Urine:800; Emesis/NG output:1200]  Intake/Output this shift:      Physical Exam:   General: WN AA M who is alert and oriented.    HEENT: Normal. Pupils equal. .   Lungs: Clear   Abdomen: Moderate distention, BS present, no tenderness.    Neurologic:  Grossly intact to motor and sensory function.   Psychiatric: Has normal mood and affect.   Lab Results:    Basename 07/25/11 0534 07/24/11 0620  WBC 9.4 11.8*  HGB 12.4* 13.8  HCT 39.3 42.7  PLT 196 198    BMET   Basename 07/25/11 0534 07/24/11 0620  NA 138 141  K 3.5 2.9*  CL 102 94*  CO2 27 37*  GLUCOSE 81 121*  BUN 17 22  CREATININE 0.69 0.98  CALCIUM 8.9 9.4    PT/INR  No results found for this basename: LABPROT:2,INR:2 in the last 72 hours  ABG  No results found for this basename: PHART:2,PCO2:2,PO2:2,HCO3:2 in the last 72 hours   Studies/Results:  No results found.   Anti-infectives:   Anti-infectives    None      Ovidio Kin, MD, FACS Pager: [phone],   [facility] Surgery Office: [phone] 07/25/2011

## 2011-07-26 ENCOUNTER — Inpatient Hospital Stay (HOSPITAL_COMMUNITY): Payer: Medicaid Other

## 2011-07-26 DIAGNOSIS — I10 Essential (primary) hypertension: Secondary | ICD-10-CM

## 2011-07-26 DIAGNOSIS — K5669 Other intestinal obstruction: Secondary | ICD-10-CM

## 2011-07-26 DIAGNOSIS — E118 Type 2 diabetes mellitus with unspecified complications: Secondary | ICD-10-CM

## 2011-07-26 DIAGNOSIS — E1165 Type 2 diabetes mellitus with hyperglycemia: Secondary | ICD-10-CM

## 2011-07-26 DIAGNOSIS — R112 Nausea with vomiting, unspecified: Secondary | ICD-10-CM

## 2011-07-26 LAB — BASIC METABOLIC PANEL
BUN: 18 mg/dL (ref 6–23)
Calcium: 9.1 mg/dL (ref 8.4–10.5)
Creatinine, Ser: 0.71 mg/dL (ref 0.50–1.35)
GFR calc Af Amer: >90 mL/min (ref >90)
GFR calc non Af Amer: >90 mL/min (ref >90)
Glucose, Bld: 84 mg/dL (ref 70–99)
Potassium: 3.4 mEq/L — ABNORMAL LOW (ref 3.5–5.1)

## 2011-07-26 LAB — GLUCOSE, CAPILLARY
Glucose-Capillary: 146 mg/dL — ABNORMAL HIGH (ref 70–99)
Glucose-Capillary: 64 mg/dL — ABNORMAL LOW (ref 70–99)
Glucose-Capillary: 65 mg/dL — ABNORMAL LOW (ref 70–99)
Glucose-Capillary: 75 mg/dL (ref 70–99)
Glucose-Capillary: 85 mg/dL (ref 70–99)

## 2011-07-26 LAB — CBC
HCT: 39.6 % (ref 39.0–52.0)
Hemoglobin: 12.5 g/dL — ABNORMAL LOW (ref 13.0–17.0)
MCHC: 31.6 g/dL (ref 30.0–36.0)
RBC: 4.56 MIL/uL (ref 4.22–5.81)
WBC: 8.9 10*3/uL (ref 4.0–10.5)

## 2011-07-26 MED ORDER — DEXTROSE 50 % IV SOLN
1.0000 | Freq: Once | INTRAVENOUS | Status: AC
  Administered 2011-07-26: 50 mL via INTRAVENOUS
  Filled 2011-07-26 (×2): qty 50

## 2011-07-26 MED ORDER — SENNOSIDES-DOCUSATE SODIUM 8.6-50 MG PO TABS
1.0000 | ORAL_TABLET | Freq: Two times a day (BID) | ORAL | Status: DC
  Administered 2011-07-26 – 2011-07-28 (×5): 1 via NASOGASTRIC
  Filled 2011-07-26 (×6): qty 1

## 2011-07-26 MED ORDER — DEXTROSE 50 % IV SOLN
1.0000 | Freq: Once | INTRAVENOUS | Status: AC
  Administered 2011-07-26: 50 mL via INTRAVENOUS
  Filled 2011-07-26: qty 50

## 2011-07-26 NOTE — Progress Notes (Signed)
CARE MANAGEMENT NOTE 07/26/2011  Patient:  Craig Bentley,Craig Bentley   Account Number:  [id number]  Date Initiated:  07/26/2011  Documentation initiated by:  Matayah Reyburn  Subjective/Objective Assessment:   pt admitted with sbo, [id number]-improving but remains, ngtube ,npo     Action/Plan:   lives at home   Anticipated DC Date:  07/29/2011   Anticipated DC Plan:  HOME/SELF CARE  In-house referral  NA      DC Planning Services  NA      [facility] Choice  NA   Choice offered to / List presented to:  NA   DME arranged  NA      DME agency  NA     HH arranged  NA      HH agency  NA   Status of service:  In process, will continue to follow Medicare Important Message given?  NA - LOS <3 / Initial given by admissions (If response is "NO", the following Medicare IM given date fields will be blank) Date Medicare IM given:   Date Additional Medicare IM given:    Discharge Disposition:    Per UR Regulation:  Reviewed for med. necessity/level of care/duration of stay  If discussed at Long Length of Stay Meetings, dates discussed:    Comments:  [id number]/Cinthia Rodden Stark Jock, BSN, CCM: [phone] Case Management Chart review for utilization review. No discharge need present at time of this review.

## 2011-07-26 NOTE — Progress Notes (Signed)
Patient interviewed and examined, agree with PA note above.  He is improved today, abdomen is soft and x-rays are improved. Plan likely to get NG tube out tomorrow  Mariella Saa MD, FACS  07/26/2011 7:51 PM

## 2011-07-26 NOTE — Progress Notes (Signed)
Subjective: Wants to eat No BM recorded since 5/31.  Objective: Vital signs in last 24 hours: Temp:  [97.9 F (36.6 C)-98.6 F (37 C)] 97.9 F (36.6 C) 27-Jul-2022 0550) Pulse Rate:  [80-88] 88  07-27-2022 0550) Resp:  [18-20] 20  07-27-22 0550) BP: (111-132)/(68-72) 111/68 mmHg July 27, 2022 0550) SpO2:  [90 %-98 %] 90 % 2022-07-27 0550) Last BM Date: 07/23/11  700/NG yesterday, 750 urine, afebrile, VSS,  Labs:OK K+ a little low, film shows improvement of is SBO, still full of stool.  Intake/Output from previous day: 06/02 0701 - July 27, 2022 0700 In: 350 [NG/GT:350] Out: 1450 [Urine:750; Emesis/NG output:700] Intake/Output this shift:    General appearance: alert, cooperative and no distress GI: soft, non-tender; bowel sounds normal; no masses,  no organomegaly  Lab Results:   [facility] Jul 27, 2011 0501 07/25/11 0534  WBC 8.9 9.4  HGB 12.5* 12.4*  HCT 39.6 39.3  PLT 219 196    BMET  Basename Jul 27, 2011 0501 07/25/11 0534  NA 140 138  K 3.4* 3.5  CL 103 102  CO2 27 27  GLUCOSE 84 81  BUN 18 17  CREATININE 0.71 0.69  CALCIUM 9.1 8.9   PT/INR No results found for this basename: LABPROT:2,INR:2 in the last 72 hours   Lab 07/23/11 0430 07/21/11 0505 07/20/11 2315 07/20/11 1907  AST 16 14 16 17   ALT 14 17 18 18   ALKPHOS 68 64 69 72  BILITOT 0.4 0.5 0.4 0.4  PROT 7.1 6.6 6.9 7.4  ALBUMIN 3.5 3.5 3.7 3.8     Lipase     Component Value Date/Time   LIPASE 18 07/20/2011 1907     Studies/Results: Dg Abd 2 Views  2011-07-27  *RADIOLOGY REPORT*  Clinical Data: Small bowel obstruction, fecal impaction, follow-up  ABDOMEN - 2 VIEW  Comparison: Abdomen films of 07/19/2011 and CT of the abdomen and pelvis of 07/22/2011  Findings: The previously noted small bowel obstruction has largely been decompressed with NG tube present.  However slightly prompt small bowel loops do remain in the mid abdomen.  There is a large amount of feces throughout the colon most consistent with constipation.  No colonic  obstruction or free intraperitoneal air is evident.  There is linear atelectasis at the left lung base. Heart size is stable.  IMPRESSION:  1.  Significant improvement in small bowel obstruction with slightly prominent small bowel loops remaining.  NG tube tip in body of stomach. 2.  Large amount of feces throughout the colon.  No colon obstruction or free air.  Original Report Authenticated By: Juline Patch, M.D.    Medications:    . aspirin EC  81 mg Oral Daily  . benztropine  1 mg Per NG tube BID  . bisacodyl  10 mg Rectal Daily  . cloZAPine  100 mg Per NG tube BID WC  . cloZAPine  400 mg Per NG tube QHS  . dextrose  1 ampule Intravenous Once  . enoxaparin  40 mg Subcutaneous Q24H  . haloperidol  10 mg Per Tube QHS  . haloperidol  5 mg Per Tube Daily  . insulin aspart  0-9 Units Subcutaneous TID WC  . lactulose  30 g Per Tube BID  . lisinopril  20 mg Per NG tube Daily  . polyethylene glycol  17 g Oral Daily  . senna-docusate  1 tablet Per NG tube BID  . simvastatin  5 mg Per NG tube QHS  . sodium chloride  3 mL Intravenous Q12H  . sodium  phosphate  1 enema Rectal TID  . DISCONTD: docusate sodium  100 mg Oral BID    Assessment/Plan Partial small bowel obstruction, in virgin abdomen.  Last CT scan - 07/22/2011 - persist ant SBO, very redundant colon Constipation History of Hypertension COPD (chronic obstructive pulmonary disease) () . Diabetes mellitus  Schizophrenia  Chronic left ext iliac artery occlusion    Plan:  SSE, already on Chronulas bid.     LOS: 6 days    Craig Bentley 07/26/2011

## 2011-07-26 NOTE — Progress Notes (Signed)
Patient ID: Craig Bentley, male   DOB: 1955-04-08, 56 y.o.   MRN: [id number]  Subjective: No events overnight. Patient denies chest pain, shortness of breath, abdominal pain. Pt is hungry and wants to eat.  Objective:  Vital signs in last 24 hours:  Filed Vitals:   07/25/11 0632 07/25/11 1505 07/25/11 2243 08-20-2011 0550  BP: 147/84 132/69 131/72 111/68  Pulse: 96 80 88 88  Temp: 98.1 F (36.7 C) 98.6 F (37 C) 98.5 F (36.9 C) 97.9 F (36.6 C)  SpO2: 92% 98% 94% 90%   Intake/Output from previous day:   Gross per 24 hour  Intake    350 ml  Output   1450 ml  Net  -1100 ml   Physical Exam: General: Alert, awake, oriented x3, in no acute distress. HEENT: No bruits, no goiter. Moist mucous membranes, no scleral icterus, no conjunctival pallor. NGT in place Heart: Regular rate and rhythm, S1/S2 +, no murmurs, rubs, gallops. Lungs: Clear to auscultation bilaterally. No wheezing, no rhonchi, no rales.  Abdomen: Soft, nontender, nondistended, positive bowel sounds. Extremities: No clubbing or cyanosis, no pitting edema,  positive pedal pulses. Neuro: Grossly nonfocal.  Lab Results:  Basic Metabolic Panel:  Lab 20-Aug-2011 4098 07/25/11 0534 07/24/11 0620 07/23/11 0430 07/22/11 0425  WBC 8.9 9.4 11.8* 10.3 9.2  HGB 12.5* 12.4* 13.8 14.4 13.2  HCT 39.6 39.3 42.7 45.3 43.3  PLT 219 196 198 206 201    Lab 08-20-11 0501 07/25/11 0534 07/24/11 0620 07/23/11 0430 07/22/11 0425  NA 140 138 141 141 142  K 3.4* 3.5 2.9* 3.6 3.5  CL 103 102 94* 95* 99  CO2 27 27 37* 35* 34*  GLUCOSE 84 81 121* 109* 73  BUN 18 17 22 20  25*  CREATININE 0.71 0.69 0.98 0.89 0.88  CALCIUM 9.1 8.9 9.4 9.9 9.2  MG -- -- -- -- 2.0   Studies/Results:  Dg Abd 2 Views 08-20-2011    IMPRESSION:   1.  Significant improvement in small bowel obstruction with slightly prominent small bowel loops remaining.  NG tube tip in body of stomach.  2.  Large amount of feces throughout the colon.  No colon obstruction or  free air.    Medications: Scheduled Meds:   . aspirin EC  81 mg Oral Daily  . benztropine  1 mg Per NG tube BID  . bisacodyl  10 mg Rectal Daily  . cloZAPine  100 mg Per NG tube BID WC  . cloZAPine  400 mg Per NG tube QHS  . docusate sodium  100 mg Oral BID  . enoxaparin  40 mg Subcutaneous Q24H  . haloperidol  10 mg Per Tube QHS  . haloperidol  5 mg Per Tube Daily  . insulin aspart  0-9 Units Subcutaneous TID WC  . lactulose  30 g Per Tube BID  . lisinopril  20 mg Per NG tube Daily  . polyethylene glycol  17 g Oral Daily  . simvastatin  5 mg Per NG tube QHS  . sodium chloride  3 mL Intravenous Q12H  . sodium phosphate  1 enema Rectal TID   Continuous Infusions:   . sodium chloride 75 mL/hr at 07/25/11 1541   PRN Meds:.albuterol, gi cocktail, guaiFENesin, HYDROmorphone (DILAUDID) injection, ondansetron (ZOFRAN) IV, ondansetron  Assessment/Plan:  Principal Problem:  *Partial small bowel obstruction  - pt comfortable this AM but did not have bowel movement yet - on KUB improvement noted with SBO - will need to continue aggressive bowel  regimen and continue with enema - if surgery ok we may try advancing diet to clears and see how pt tolerates  - will continue antiemetics as needed  - follow up in surgery recommendations, appreciate input   Active Problems:  Leukocytosis  - likely secondary to primary problem  - WBC within normal limits this AM  - CBC in AM   Hypokalemia  - supplement as indicated - BMP in AM   Hypertension, uncontrolled  - will continue current medication regimen  - monitor vitals per floor protocol   Chronic respiratory failure secondary to COPD (chronic obstructive pulmonary disease)  - this appears to be clinically stable  - pt is maintaining oxygen saturations > 95%   Diabetes mellitus, uncontrolled and with complications, Gastroparesis  - continue Insulin SSI and hold off on Metformin as pt unable to tolerate PO   Schizophrenia  -  clinically stable   EDUCATION  - test results and diagnostic studies were discussed with patient  - patient verbalized the understanding  - questions were answered at the bedside and contact information was provided for additional questions or concerns     LOS: 6 days   MAGICK-Lynette Topete 07/26/2011, 8:53 AM  [facility] Pager: [phone]

## 2011-07-27 DIAGNOSIS — I10 Essential (primary) hypertension: Secondary | ICD-10-CM

## 2011-07-27 DIAGNOSIS — E1165 Type 2 diabetes mellitus with hyperglycemia: Secondary | ICD-10-CM

## 2011-07-27 DIAGNOSIS — R112 Nausea with vomiting, unspecified: Secondary | ICD-10-CM

## 2011-07-27 DIAGNOSIS — K5669 Other intestinal obstruction: Secondary | ICD-10-CM

## 2011-07-27 DIAGNOSIS — E118 Type 2 diabetes mellitus with unspecified complications: Secondary | ICD-10-CM

## 2011-07-27 LAB — CBC
HCT: 37.8 % — ABNORMAL LOW (ref 39.0–52.0)
Hemoglobin: 12 g/dL — ABNORMAL LOW (ref 13.0–17.0)
MCH: 27.3 pg (ref 26.0–34.0)
MCHC: 31.7 g/dL (ref 30.0–36.0)
MCV: 86.1 fL (ref 78.0–100.0)
RDW: 12.6 % (ref 11.5–15.5)

## 2011-07-27 LAB — DIFFERENTIAL
Basophils Absolute: 0 10*3/uL (ref 0.0–0.1)
Eosinophils Absolute: 0 10*3/uL (ref 0.0–0.7)
Eosinophils Relative: 0 % (ref 0–5)
Lymphocytes Relative: 20 % (ref 12–46)
Neutrophils Relative %: 70 % (ref 43–77)

## 2011-07-27 LAB — GLUCOSE, CAPILLARY
Glucose-Capillary: 79 mg/dL (ref 70–99)
Glucose-Capillary: 85 mg/dL (ref 70–99)
Glucose-Capillary: 73 mg/dL (ref 70–99)

## 2011-07-27 LAB — BASIC METABOLIC PANEL
BUN: 14 mg/dL (ref 6–23)
Creatinine, Ser: 0.71 mg/dL (ref 0.50–1.35)
GFR calc Af Amer: >90 mL/min (ref >90)
GFR calc non Af Amer: >90 mL/min (ref >90)
Glucose, Bld: 79 mg/dL (ref 70–99)
Potassium: 3.2 mEq/L — ABNORMAL LOW (ref 3.5–5.1)

## 2011-07-27 MED ORDER — POTASSIUM CHLORIDE CRYS ER 20 MEQ PO TBCR
40.0000 meq | EXTENDED_RELEASE_TABLET | Freq: Every day | ORAL | Status: AC
  Administered 2011-07-27 – 2011-07-29 (×3): 40 meq via ORAL
  Filled 2011-07-27 (×4): qty 2

## 2011-07-27 MED ORDER — POTASSIUM CHLORIDE IN NACL 40-0.9 MEQ/L-% IV SOLN
INTRAVENOUS | Status: DC
  Administered 2011-07-27: 22:00:00 via INTRAVENOUS
  Administered 2011-07-27 – 2011-07-28 (×2): 100 mL/h via INTRAVENOUS
  Filled 2011-07-27 (×9): qty 1000

## 2011-07-27 NOTE — Progress Notes (Signed)
Patient's potassium today was 3.2.  Dr. Marlyne Beards place potassium in patient's IV solution. Will continue to monitor.

## 2011-07-27 NOTE — Progress Notes (Signed)
Reconnected patient to suction after four hours off suction at 1650pm.  During time patient was off suction, patient did not have any abdominal distention, pain, or nausea or vomiting.  After reconnecting patient back to suction, 200cc's of green bile drained immediately.  Patient to remain on low intermittent suction.  After giving patient ordered KDUR tablets through NG tube, NG tube became clogged.  Dr. Biagio Quint of surgery aware.  RN instructed to flush water or normal saline through blue port of NG tube and then to attempt to flush clear part of NG tube after water has sat in the tube for a while.  RN instructed to leave NG tube in patient clampped if unable to flush.  Will continue to monitor.

## 2011-07-27 NOTE — Progress Notes (Signed)
Patient's NG tube clampped at 1230 pm.  Explained to patien to call the nurse if he feels nauseous or has any vomitting or increased pain.  If this occurs RN will connect back to suction.  Patient verbalizes understanding.  Will continue to monitor.

## 2011-07-27 NOTE — Progress Notes (Signed)
Pt had a medium sized semi formed BM after 2200 enema administered. Will continue to monitor.

## 2011-07-27 NOTE — Progress Notes (Signed)
Patient ID: Craig Bentley, male   DOB: [birth year], 56 y.o.   MRN: [id number]  INTERIM SUMMARY: Pt is 56 yo very pleasant male with multiple and complex medical history who presented initially 07/20/2011 to [facility] ED with main concern of progressively worsening, sharp and generalized and mostly periumbilical abdominal pain that was radiating to bilateral groin area and back area. This initially started 1 day prior to admission and was associated with nausea, vomiting. He denies any specific aggravating or alleviating factors, no fevers, no chills, no urinary concerns, no headaches and no other systemic symptoms, no similar episodes in the past. Pt denies any recent sicknesses or hospitalizations, no sick contacts or exposures. Diagnosis of Small Bowel Obstruction made and pt has been NPO since admission with NGT in place  CONSULTANTS: Surgery  DIAGNOSTIC TESTS: Dg Abd 2 Views  07/26/2011  IMPRESSION:  1. Significant improvement in small bowel obstruction with slightly prominent small bowel loops remaining. NG tube tip in body of stomach.  2. Large amount of feces throughout the colon. No colon obstruction or free air.   Ct Abdomen Pelvis W Contrast  07/20/2011  IMPRESSION:  1. Small bowel obstruction with subsequent gastric and duodenal distention with air and fluid. Two adjacent transition points in the right abdomen with decompressed ileum.  2. Colon is chronically redundant and distended with stool.  3. Trace free fluid in the left pericolic gutter and pelvis likely is reactive.  4. Indeterminate bilateral adrenal nodules, on the left 17 x 20 mm. Given the density on this study, follow-up nonemergent adrenal mass protocol CT may best characterize further.  5. Chronic left iliac artery occlusion.  6. Chronic lung base disease   Ct Abdomen Pelvis W Contrast  07/22/2011  IMPRESSION:  1. Mildly progressive small bowel obstruction.  2. Partial decompression of the stomach following nasogastric tube  placement.  3. Continued prominent stool in redundant colon.  4. Stable adrenal masses.  5. COPD.  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   Subjective: No events overnight. Patient denies chest pain, shortness of breath, abdominal pain. Had bowel movement and reports ambulating.  Objective:  Vital signs in last 24 hours:  Filed Vitals:   07/26/11 0550 07/26/11 2230 07/27/11 0616 07/27/11 1414  BP: 111/68 124/70 155/84 163/79  Pulse: 88 72 76 82  Temp: 97.9 F (36.6 C) 99.2 F (37.3 C) 98.7 F (37.1 C) 98.6 F (37 C)  SpO2: 90% 93% 93% 97%   Intake/Output from previous day:  Intake/Output Summary (Last 24 hours) at 07/27/11 1509 Last data filed at 07/27/11 0800  Gross per 24 hour  Intake 1306.25 ml  Output    500 ml  Net 806.25 ml   Physical Exam: General: Alert, awake, oriented x3, in no acute distress. HEENT: No bruits, no goiter. Moist mucous membranes, no scleral icterus, no conjunctival pallor. Heart: Regular rate and rhythm, S1/S2 +, no murmurs, rubs, gallops. Lungs: Clear to auscultation bilaterally. No wheezing, no rhonchi, no rales.  Abdomen: Soft, nontender, nondistended, positive bowel sounds. Extremities: No clubbing or cyanosis, no pitting edema,  positive pedal pulses. Neuro: Grossly nonfocal.  Lab 07/27/11 0435 07/26/11 0501 07/25/11 0534 07/24/11 0620 07/23/11 0430 07/20/11 1907  WBC 8.2 8.9 9.4 11.8* 10.3 --  HGB 12.0* 12.5* 12.4* 13.8 14.4 --  HCT 37.8* 39.6 39.3 42.7 45.3 --  PLT 205 219 196 198 206 --    Lab 07/27/11 0435 07/26/11 0501 07/25/11 0534 07/24/11 0620 07/23/11 0430  NA 139 140 138 141  141  K 3.2* 3.4* 3.5 2.9* 3.6  CL 105 103 102 94* 95*  CO2 24 27 27  37* 35*  GLUCOSE 79 84 81 121* 109*  BUN 14 18 17 22 20   CREATININE 0.71 0.71 0.69 0.98 0.89  CALCIUM 8.6 9.1 8.9 9.4 9.9  MG -- -- -- -- 2.0    Lab 07/20/11 2315  INR 0.96  PROTIME --    Medications: Scheduled Meds:   . aspirin EC  81 mg Oral Daily  .  benztropine  1 mg Per NG tube BID  . bisacodyl  10 mg Rectal Daily  . cloZAPine  100 mg Per NG tube BID WC  . cloZAPine  400 mg Per NG tube QHS  . dextrose  1 ampule Intravenous Once  . enoxaparin  40 mg Subcutaneous Q24H  . haloperidol  10 mg Per Tube QHS  . haloperidol  5 mg Per Tube Daily  . lactulose  30 g Per Tube BID  . lisinopril  20 mg Per NG tube Daily  . polyethylene glycol  17 g Oral Daily  . senna-docusate  1 tablet Per NG tube BID  . simvastatin  5 mg Per NG tube QHS  . sodium chloride  3 mL Intravenous Q12H  . sodium phosphate  1 enema Rectal TID  . DISCONTD: insulin aspart  0-9 Units Subcutaneous TID WC   Continuous Infusions:   . 0.9 % NaCl with KCl 40 mEq / L 100 mL/hr (07/27/11 1334)  . DISCONTD: sodium chloride 75 mL/hr at 07/27/11 0739   PRN Meds:.albuterol, gi cocktail, guaiFENesin, HYDROmorphone (DILAUDID) injection, ondansetron (ZOFRAN) IV, ondansetron  Assessment/Plan:   Principal Problem:  *Partial small bowel obstruction  - pt comfortable this AM and has had one bowel movement earlier this AM - on KUB improvement noted with SBO  - will need to continue aggressive bowel regimen and continue with enema  - if surgery ok we may clamp the NGT today and try to advance the diet and see how pt tolerates - will continue antiemetics as needed  - continue to follow up on surgery recommendations, appreciate input   Active Problems:  Leukocytosis  - likely secondary to primary problem  - WBC within normal limits this AM  - CBC in AM   Hypokalemia  - will supplement as indicated  - will check magnesium level - BMP in AM   Hypertension, uncontrolled  - will continue current medication regimen  - monitor vitals per floor protocol   Chronic respiratory failure secondary to COPD (chronic obstructive pulmonary disease)  - this appears to be clinically stable  - pt is maintaining oxygen saturations > 95%   Diabetes mellitus, uncontrolled and with  complications, Gastroparesis  - continue Insulin SSI and hold off on Metformin as pt unable to tolerate PO   Schizophrenia  - clinically stable   EDUCATION  - test results and diagnostic studies were discussed with patient  - patient verbalized the understanding  - questions were answered at the bedside and contact information was provided for additional questions or concerns    LOS: 7 days   MAGICK-Bena Kobel 07/27/2011, 3:09 PM  [facility] Pager: [phone]

## 2011-07-27 NOTE — Progress Notes (Signed)
Patient interviewed and examined, agree with PA note above. Abdomen remains soft and nontender. Consider removing NG tomorrow if no nausea or increased distention. Continue bowel regimen for severe constipation. Mariella Saa MD, FACS  07/27/2011 6:29 PM

## 2011-07-27 NOTE — Progress Notes (Signed)
Subjective:  Says he feels better, had about 3 BM's yesterday with his enema. Wants to know if NG can come out.  Objective: Vital signs in last 24 hours: Temp:  [98.7 F (37.1 C)-99.2 F (37.3 C)] 98.7 F (37.1 C) (06/04 0616) Pulse Rate:  [72-76] 76  (06/04 0616) Resp:  [18] 18  (06/04 0616) BP: (124-155)/(70-84) 155/84 mmHg (06/04 0616) SpO2:  [93 %] 93 % (06/04 0616) Last BM Date: 08/16/11  BM yesterday, labs OK, 1100 from NG yesterday, no film today  Intake/Output from previous day: 08-16-22 0701 - 06/04 0700 In: 1846.3 [I.V.:1726.3; NG/GT:120] Out: 1100 [Emesis/NG output:1100] Intake/Output this shift: Total I/O In: 60 [NG/GT:60] Out: -   General appearance: alert, cooperative and no distress Resp: clear to auscultation bilaterally GI: soft, non-tender; bowel sounds normal; no masses,  no organomegaly  Lab Results:   [facility] 07/27/11 0435 08/16/2011 0501  WBC 8.2 8.9  HGB 12.0* 12.5*  HCT 37.8* 39.6  PLT 205 219    BMET  Basename 07/27/11 0435 2011/08/16 0501  NA 139 140  K 3.2* 3.4*  CL 105 103  CO2 24 27  GLUCOSE 79 84  BUN 14 18  CREATININE 0.71 0.71  CALCIUM 8.6 9.1   PT/INR No results found for this basename: LABPROT:2,INR:2 in the last 72 hours   Lab 07/23/11 0430 07/21/11 0505 07/20/11 2315 07/20/11 1907  AST 16 14 16 17   ALT 14 17 18 18   ALKPHOS 68 64 69 72  BILITOT 0.4 0.5 0.4 0.4  PROT 7.1 6.6 6.9 7.4  ALBUMIN 3.5 3.5 3.7 3.8     Lipase     Component Value Date/Time   LIPASE 18 07/20/2011 1907     Studies/Results: Dg Abd 2 Views  2011/08/16  *RADIOLOGY REPORT*  Clinical Data: Small bowel obstruction, fecal impaction, follow-up  ABDOMEN - 2 VIEW  Comparison: Abdomen films of 07/19/2011 and CT of the abdomen and pelvis of 07/22/2011  Findings: The previously noted small bowel obstruction has largely been decompressed with NG tube present.  However slightly prompt small bowel loops do remain in the mid abdomen.  There is a large amount  of feces throughout the colon most consistent with constipation.  No colonic obstruction or free intraperitoneal air is evident.  There is linear atelectasis at the left lung base. Heart size is stable.  IMPRESSION:  1.  Significant improvement in small bowel obstruction with slightly prominent small bowel loops remaining.  NG tube tip in body of stomach. 2.  Large amount of feces throughout the colon.  No colon obstruction or free air.  Original Report Authenticated By: Juline Patch, M.D.    Medications:    . aspirin EC  81 mg Oral Daily  . benztropine  1 mg Per NG tube BID  . bisacodyl  10 mg Rectal Daily  . cloZAPine  100 mg Per NG tube BID WC  . cloZAPine  400 mg Per NG tube QHS  . dextrose  1 ampule Intravenous Once  . enoxaparin  40 mg Subcutaneous Q24H  . haloperidol  10 mg Per Tube QHS  . haloperidol  5 mg Per Tube Daily  . lactulose  30 g Per Tube BID  . lisinopril  20 mg Per NG tube Daily  . polyethylene glycol  17 g Oral Daily  . senna-docusate  1 tablet Per NG tube BID  . simvastatin  5 mg Per NG tube QHS  . sodium chloride  3 mL Intravenous Q12H  .  sodium phosphate  1 enema Rectal TID  . DISCONTD: insulin aspart  0-9 Units Subcutaneous TID WC    Assessment/Plan Partial small bowel obstruction, in virgin abdomen.  Last CT scan - 07/22/2011 - persist ant SBO, very redundant colon  Constipation  History of Hypertension COPD (chronic obstructive pulmonary disease) ()  . Diabetes mellitus  Schizophrenia  Chronic left ext iliac artery occlusion   Plan:  Clamping trial of  NG and see how he does.  Repeat SSE. Replace K+.      LOS: 7 days    Sonoma Firkus 07/27/2011

## 2011-07-28 DIAGNOSIS — R112 Nausea with vomiting, unspecified: Secondary | ICD-10-CM

## 2011-07-28 DIAGNOSIS — E118 Type 2 diabetes mellitus with unspecified complications: Secondary | ICD-10-CM

## 2011-07-28 DIAGNOSIS — E1165 Type 2 diabetes mellitus with hyperglycemia: Secondary | ICD-10-CM

## 2011-07-28 DIAGNOSIS — I10 Essential (primary) hypertension: Secondary | ICD-10-CM

## 2011-07-28 DIAGNOSIS — K5669 Other intestinal obstruction: Secondary | ICD-10-CM

## 2011-07-28 LAB — GLUCOSE, CAPILLARY
Glucose-Capillary: 79 mg/dL (ref 70–99)
Glucose-Capillary: 120 mg/dL — ABNORMAL HIGH (ref 70–99)

## 2011-07-28 LAB — BASIC METABOLIC PANEL
BUN: 8 mg/dL (ref 6–23)
CO2: 23 mEq/L (ref 19–32)
Calcium: 9 mg/dL (ref 8.4–10.5)
Creatinine, Ser: 0.69 mg/dL (ref 0.50–1.35)
GFR calc Af Amer: >90 mL/min (ref >90)

## 2011-07-28 LAB — CBC
MCH: 27.5 pg (ref 26.0–34.0)
MCV: 86.2 fL (ref 78.0–100.0)
Platelets: 229 10*3/uL (ref 150–400)
RDW: 12.5 % (ref 11.5–15.5)

## 2011-07-28 MED ORDER — LACTULOSE 10 GM/15ML PO SOLN
30.0000 g | Freq: Two times a day (BID) | ORAL | Status: DC
  Administered 2011-07-28 – 2011-07-30 (×4): 30 g via ORAL
  Filled 2011-07-28 (×5): qty 45

## 2011-07-28 MED ORDER — SORBITOL 70 % SOLN
960.0000 mL | TOPICAL_OIL | Freq: Once | ORAL | Status: DC
  Filled 2011-07-28: qty 240

## 2011-07-28 MED ORDER — SENNOSIDES-DOCUSATE SODIUM 8.6-50 MG PO TABS
1.0000 | ORAL_TABLET | Freq: Two times a day (BID) | ORAL | Status: DC
  Administered 2011-07-28 – 2011-07-30 (×4): 1 via ORAL
  Filled 2011-07-28 (×5): qty 1

## 2011-07-28 NOTE — Progress Notes (Signed)
After fleets enema given at 1600 able to have a huge bowel movement. Dr.Rai notified and stated to D/C SMOG enema

## 2011-07-28 NOTE — Progress Notes (Signed)
  Subjective: Pt says he didn't have a BM yesterday even after enema, Want to know if he can eat. He has full liquids listed from yesterday afternoon.  He apparently had a Potassium tablet placed in NG which completely occluded it and has not been able to use since yesterday anyway.  Objective: Vital signs in last 24 hours: Temp:  [98.1 F (36.7 C)-98.8 F (37.1 C)] 98.1 F (36.7 C) (06/05 0500) Pulse Rate:  [68-82] 75  (06/05 0500) Resp:  [18-20] 18  (06/05 0500) BP: (121-163)/(78-84) 155/84 mmHg (06/05 0500) SpO2:  [96 %-97 %] 96 % (06/05 0500) Last BM Date: 07/26/11  No BM recorded for 2 days on I/O,VSS, labs OK, K+ up to 4.2  Intake/Output from previous day: 06/04 0701 - 06/05 0700 In: 2023.3 [P.O.:360; I.V.:1543.3; NG/GT:120] Out: 1000 [Urine:600; Emesis/NG output:400] Intake/Output this shift:    General appearance: alert, cooperative and no distress GI: soft, non-tender; bowel sounds normal; no masses,  no organomegaly. He's still a little distended.  Lab Results:   Basename 07/28/11 0455 07/27/11 0435  WBC 8.2 8.2  HGB 12.5* 12.0*  HCT 39.2 37.8*  PLT 229 205    BMET  Basename 07/28/11 0455 07/27/11 0435  NA 138 139  K 4.2 3.2*  CL 105 105  CO2 23 24  GLUCOSE 85 79  BUN 8 14  CREATININE 0.69 0.71  CALCIUM 9.0 8.6   PT/INR No results found for this basename: LABPROT:2,INR:2 in the last 72 hours   Lab 07/23/11 0430  AST 16  ALT 14  ALKPHOS 68  BILITOT 0.4  PROT 7.1  ALBUMIN 3.5     Lipase     Component Value Date/Time   LIPASE 18 07/20/2011 1907     Studies/Results: No results found.  Medications:    . aspirin EC  81 mg Oral Daily  . benztropine  1 mg Per NG tube BID  . bisacodyl  10 mg Rectal Daily  . cloZAPine  100 mg Per NG tube BID WC  . cloZAPine  400 mg Per NG tube QHS  . enoxaparin  40 mg Subcutaneous Q24H  . haloperidol  10 mg Per Tube QHS  . haloperidol  5 mg Per Tube Daily  . lactulose  30 g Per Tube BID  . lisinopril   20 mg Per NG tube Daily  . polyethylene glycol  17 g Oral Daily  . potassium chloride  40 mEq Oral Daily  . senna-docusate  1 tablet Per NG tube BID  . simvastatin  5 mg Per NG tube QHS  . sodium chloride  3 mL Intravenous Q12H  . sodium phosphate  1 enema Rectal TID    Assessment/Plan Partial small bowel obstruction, in virgin abdomen.  Last CT scan - 07/22/2011 - persist ant SBO, very redundant colon  Constipation  History of Hypertension COPD (chronic obstructive pulmonary disease) ()  . Diabetes mellitus  Schizophrenia  Chronic left ext iliac artery occlusion   Plan:  D/C NG, give him the full liquids ordered yesterday and see how he does.  .     LOS: 8 days    Norvella Loscalzo 07/28/2011

## 2011-07-28 NOTE — Progress Notes (Signed)
Nutrition Brief Note  - Pt screened for length of stay. Pt now on full liquid diet which he reports he is tolerating well without nausea and ate 100% of lunch. Pt reported that he may be d/c tomorrow. Pt reports PTA he was eating well, 3 meals/day, no changes in weight. Pt admitted with partial SBO, NGT d/c today. Pt had small BM today. Intake currently excellent. No nutrition diagnosis at this time. Will monitor.   Dietitian# [phone]

## 2011-07-28 NOTE — Progress Notes (Signed)
Patient interviewed and examined, agree with PA note above. Patient reports large bowel movement. Abdomen is soft and progressively less distended. SBO/obstipation appears to be resolving Mariella Saa MD, FACS  07/28/2011 7:48 PM

## 2011-07-28 NOTE — Progress Notes (Signed)
Patient ID: Craig Bentley  male  JXB:[id number]    DOB: Jul 08, 1955    DOA: 07/20/2011  PCP: No primary provider on file.  Subjective: Tolerating full liquid diet, no BM yet  Objective: Weight change:   Intake/Output Summary (Last 24 hours) at 07/28/11 1616 Last data filed at 07/28/11 1400  Gross per 24 hour  Intake 6412.58 ml  Output   2000 ml  Net 4412.58 ml   Blood pressure 126/80, pulse 88, temperature 98.4 F (36.9 C), temperature source Oral, resp. rate 18, height 5\' 9"  (1.753 m), weight 73.465 kg (161 lb 15.4 oz), SpO2 98.00%.  Physical Exam: General: Alert and awake, oriented x3, not in any acute distress. HEENT: anicteric sclera, pupils reactive to light and accommodation, EOMI CVS: S1-S2 clear, no murmur rubs or gallops Chest: clear to auscultation bilaterally, no wheezing, rales or rhonchi Abdomen: soft nontender, +distended, normal bowel sounds, no organomegaly Extremities: no cyanosis, clubbing or edema noted bilaterally Neuro: Cranial nerves II-XII intact, no focal neurological deficits  Lab Results: Basic Metabolic Panel:  Lab 07/28/11 1308 07/27/11 0435  NA 138 139  K 4.2 3.2*  CL 105 105  CO2 23 24  GLUCOSE 85 79  BUN 8 14  CREATININE 0.69 0.71  CALCIUM 9.0 8.6  MG -- --  PHOS -- --   Liver Function Tests:  Lab 07/23/11 0430  AST 16  ALT 14  ALKPHOS 68  BILITOT 0.4  PROT 7.1  ALBUMIN 3.5   CBC:  Lab 07/28/11 0455 07/27/11 0435  WBC 8.2 8.2  NEUTROABS -- 5.8  HGB 12.5* 12.0*  HCT 39.2 37.8*  MCV 86.2 86.1  PLT 229 205   CBG:  Lab 07/28/11 1233 07/28/11 0833 07/27/11 2149 07/27/11 1958 07/27/11 1720  GLUCAP 122* 79 91 60* 73     Micro Results: No results found for this or any previous visit (from the past 240 hour(s)).  Studies/Results: Ct Abdomen Pelvis W Contrast  07/22/2011  *RADIOLOGY REPORT*  Clinical Data: Follow-up small bowel obstruction, clinically improving.  CT ABDOMEN AND PELVIS WITH CONTRAST  Technique:  Multidetector  CT imaging of the abdomen and pelvis was performed following the standard protocol during bolus administration of intravenous contrast.  Contrast: OMNIPAQUE IOHEXOL 300 MG/ML  SOLN  Comparison: 07/20/2011.  Findings: Interval nasogastric tube with its tip in the mid to distal stomach.  The stomach is dilated, with improvement.  Mildly progressive proximal small bowel dilatation with continued normal caliber distal small bowel loops.  Prominent stool is again demonstrated in the colon.  Normal appearing appendix.  Poorly distended gallbladder with mild diffuse wall enhancement without abnormal wall thickening. Unremarkable liver, spleen, pancreas, kidneys and urinary bladder. Normal sized prostate gland containing a coarse calcification.  The previously seen free peritoneal fluid in the pelvis is not currently visualized.  Previously described bilateral adrenal masses are unchanged. Previously noted chronic left external iliac occlusion.  No enlarged lymph nodes.  Bullous changes are again noted at the lung bases.  Mild lumbar and lower thoracic spine degenerative changes.  IMPRESSION:  1.  Mildly progressive small bowel obstruction. 2.  Partial decompression of the stomach following nasogastric tube placement. 3.  Continued prominent stool in redundant colon. 4.  Stable adrenal masses. 5.  COPD. 6.  Chronic left external iliac artery occlusion.  Original Report Authenticated By: Darrol Angel, M.D.   Ct Abdomen Pelvis W Contrast  07/20/2011  *RADIOLOGY REPORT*  Clinical Data: 56 year old male with nausea and vomiting. History of small  bowel obstruction.  CT ABDOMEN AND PELVIS WITH CONTRAST  Technique:  Multidetector CT imaging of the abdomen and pelvis was performed following the standard protocol during bolus administration of intravenous contrast.  Contrast: OMNIPAQUE IOHEXOL 300 MG/ML  SOLN  Comparison: Acute abdominal series 07/19/2011 and earlier.  Findings: Chronic scarring and cystic lung  disease at the lung bases.  No pericardial or pleural effusion. No acute osseous abnormality identified.  Small volume of pelvic free fluid.  Decompressed rectum.  Redundant sigmoid colon extends into the right lower quadrant with focal gaseous distention, is decompressed in the central pelvis, and then demonstrates gaseous distention also in the left lower quadrant. The descending colon has a normal caliber and contains stool.  The splenic flexure and transverse colon are redundant containing stool.  The proximal transverse colon and hepatic flexure mildly dilated and contains stool.  The cecum and appendix are normal except for retained stool.  The terminal ileum is decompressed.  Trace free fluid in the left pericolic gutter.  Markedly distended stomach with an air-fluid level.  The duodenum is moderately distended.  There is a small duodenal diverticulum containing air and fluid.  The jejunum is distended up to 5 cm diameter.  Multiple jejunal loops show fluid distention and air-fluid levels.  Gas distended more distal small bowel loops are present in the ventral and right abdomen with two-view focal transition point demonstrated on series 2 images 46 and 49 (mildly gas distended intervening loop between the to the transition. Numerous ileal loops then are decompressed.  But do contain some gas.  No small bowel mesenteric fluid or mesenteric stranding.  The portal venous system appears patent.  The liver, gallbladder, spleen, pancreas and kidneys are within normal limits.  Bilateral adrenal nodules are present and have increased since 2011 and show a moderate to high level of enhancement.  The larger is on the left measuring 17 x 20 mm.  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  Flow is recanalized at the left femoral artery bifurcation as before. Widespread atherosclerosis elsewhere.  IMPRESSION:  1.  Small bowel obstruction with subsequent gastric and duodenal distention with air and fluid.  Two  adjacent transition points in the right abdomen (see images 46 and 49) with decompressed ileum. 2.  Colon is chronically redundant and distended with stool. 3.  Trace free fluid in the left pericolic gutter and pelvis likely is reactive. 4.  Indeterminate bilateral adrenal nodules, on the left measuring 17 x 20 mm.  Given the density on this study, follow-up nonemergent adrenal mass protocol CT may best characterize further. 5.  Chronic left iliac artery occlusion. 6.  Chronic lung base disease.  Original Report Authenticated By: Harley Hallmark, M.D.   Dg Abd 2 Views  07/26/2011  *RADIOLOGY REPORT*  Clinical Data: Small bowel obstruction, fecal impaction, follow-up  ABDOMEN - 2 VIEW  Comparison: Abdomen films of 07/19/2011 and CT of the abdomen and pelvis of 07/22/2011  Findings: The previously noted small bowel obstruction has largely been decompressed with NG tube present.  However slightly prompt small bowel loops do remain in the mid abdomen.  There is a large amount of feces throughout the colon most consistent with constipation.  No colonic obstruction or free intraperitoneal air is evident.  There is linear atelectasis at the left lung base. Heart size is stable.  IMPRESSION:  1.  Significant improvement in small bowel obstruction with slightly prominent small bowel loops remaining.  NG tube tip in body  of stomach. 2.  Large amount of feces throughout the colon.  No colon obstruction or free air.  Original Report Authenticated By: Juline Patch, M.D.   Dg Abd Acute W/chest  07/19/2011  *RADIOLOGY REPORT*  Clinical Data: Abdominal distension, new onset abdominal pain  ACUTE ABDOMEN SERIES (ABDOMEN 2 VIEW & CHEST 1 VIEW)  Comparison: Abdominal radiographs dated 11/02/2010  Findings: Lungs are essentially clear. No pleural effusion or pneumothorax.  Cardiomediastinal silhouette is within normal limits.  Multiple dilated loops of small bowel in the mid abdomen, measuring up to 5.4 cm.  Colon is not  decompressed.  Moderate stool in the left colon.  Mild degenerative changes of the visualized thoracolumbar spine.  IMPRESSION: Multiple dilated loops of small bowel in the mid abdomen, measuring up to 5.4 cm.  Moderate stool in the left colon, which is not decompressed.  This appearance could reflect early/partial small bowel obstruction or adynamic ileus.  No evidence of acute cardiopulmonary disease.  Original Report Authenticated By: Charline Bills, M.D.    Medications: Scheduled Meds:   . aspirin EC  81 mg Oral Daily  . benztropine  1 mg Per NG tube BID  . bisacodyl  10 mg Rectal Daily  . cloZAPine  100 mg Per NG tube BID WC  . cloZAPine  400 mg Per NG tube QHS  . enoxaparin  40 mg Subcutaneous Q24H  . haloperidol  10 mg Per Tube QHS  . haloperidol  5 mg Per Tube Daily  . lactulose  30 g Oral BID  . lisinopril  20 mg Per NG tube Daily  . polyethylene glycol  17 g Oral Daily  . potassium chloride  40 mEq Oral Daily  . senna-docusate  1 tablet Oral BID  . simvastatin  5 mg Per NG tube QHS  . sodium chloride  3 mL Intravenous Q12H  . sodium phosphate  1 enema Rectal TID  . sorbitol, milk of mag, mineral oil, glycerin (SMOG) enema  960 mL Rectal Once  . DISCONTD: lactulose  30 g Per Tube BID  . DISCONTD: senna-docusate  1 tablet Per NG tube BID   Continuous Infusions:   . 0.9 % NaCl with KCl 40 mEq / L 100 mL/hr (07/28/11 0454)     Assessment/Plan:  Partial small bowel obstruction:   -  will need to continue aggressive bowel regimen, placed on SMOG enema today besides the stool softeners - NGT discontinued, tolerating full liquid diet, general surgery following   Active Problems:  Leukocytosis: Resolved, likely secondary to primary problem  - WBC within normal limits this AM   Hypokalemia: Resolved   Hypertension: Controlled   Chronic respiratory failure secondary to COPD (chronic obstructive pulmonary disease): Appears to be stable  - pt is maintaining oxygen  saturations > 95%   Diabetes mellitus, uncontrolled and with complications, Gastroparesis: blood sugars controlled  - continue Insulin SSI and hold off on Metformin as pt unable to tolerate PO   Schizophrenia  - clinically stable   DVT Prophylaxis: Lovenox  Code Status: Full code  Disposition: Hopefully tomorrow if tolerating regular diet   LOS: 8 days   Lyllian Gause M.D. [facility] 07/28/2011, 4:16 PM Pager: [phone]

## 2011-07-28 NOTE — Progress Notes (Signed)
STILL UNABLE TO FLUSH MAIN PORT OF NG TUBE AFTER TRYING WARM WATER, SALINE, AND COKE.  PT AGREED TO SWALLOW HIS HS MEDS WHOLE WITH CRAN GRAPE JUICE. HE TOLERATED THIS WELL AND HAS HAD NO NAUSEA AFTER MEDS OR WHILE NG TUBE HAS BEEN CLAMPED.  SPOKE WITH DR Biagio Quint WHO IS OK WITH LEAVING NG TUBE CLAMPED OVERNIGHT.  WILL CONTINUE TO MONITOR PT WHO IS CURRENTLY SLEEPING.

## 2011-07-29 DIAGNOSIS — K5669 Other intestinal obstruction: Secondary | ICD-10-CM

## 2011-07-29 DIAGNOSIS — E118 Type 2 diabetes mellitus with unspecified complications: Secondary | ICD-10-CM

## 2011-07-29 DIAGNOSIS — I10 Essential (primary) hypertension: Secondary | ICD-10-CM

## 2011-07-29 DIAGNOSIS — E1165 Type 2 diabetes mellitus with hyperglycemia: Secondary | ICD-10-CM

## 2011-07-29 DIAGNOSIS — R112 Nausea with vomiting, unspecified: Secondary | ICD-10-CM

## 2011-07-29 LAB — GLUCOSE, CAPILLARY: Glucose-Capillary: 140 mg/dL — ABNORMAL HIGH (ref 70–99)

## 2011-07-29 MED ORDER — FLEET ENEMA 7-19 GM/118ML RE ENEM: 1.0000 | ENEMA | Freq: Every day | RECTAL | Status: DC | PRN

## 2011-07-29 NOTE — Progress Notes (Signed)
  Subjective: "I had a big stool" he tolerated full liquids well.   Objective: Vital signs in last 24 hours: Temp:  [97.5 F (36.4 C)-98.4 F (36.9 C)] 97.5 F (36.4 C) (06/06 0533) Pulse Rate:  [77-88] 77  (06/06 0533) Resp:  [18] 18  (06/06 0533) BP: (98-126)/(51-80) 109/66 mmHg (06/06 0533) SpO2:  [95 %-99 %] 95 % (06/06 0533) Last BM Date: 07/28/11  2 BM recorded yesterday. 1800 PO, VSS, no labs  Intake/Output from previous day: 06/05 0701 - 06/06 0700 In: 6739.3 [P.O.:1800; I.V.:4939.3] Out: 1900 [Urine:1900] Intake/Output this shift: Total I/O In: 360 [P.O.:360] Out: -   General appearance: alert, cooperative and no distress GI: soft, not tender, still distended some, +BM  Lab Results:   Basename 07/28/11 0455 07/27/11 0435  WBC 8.2 8.2  HGB 12.5* 12.0*  HCT 39.2 37.8*  PLT 229 205    BMET  Basename 07/28/11 0455 07/27/11 0435  NA 138 139  K 4.2 3.2*  CL 105 105  CO2 23 24  GLUCOSE 85 79  BUN 8 14  CREATININE 0.69 0.71  CALCIUM 9.0 8.6   PT/INR No results found for this basename: LABPROT:2,INR:2 in the last 72 hours   Lab 07/23/11 0430  AST 16  ALT 14  ALKPHOS 68  BILITOT 0.4  PROT 7.1  ALBUMIN 3.5     Lipase     Component Value Date/Time   LIPASE 18 07/20/2011 1907     Studies/Results: No results found.  Medications:    . aspirin EC  81 mg Oral Daily  . benztropine  1 mg Per NG tube BID  . bisacodyl  10 mg Rectal Daily  . cloZAPine  100 mg Per NG tube BID WC  . cloZAPine  400 mg Per NG tube QHS  . enoxaparin  40 mg Subcutaneous Q24H  . haloperidol  10 mg Per Tube QHS  . haloperidol  5 mg Per Tube Daily  . lactulose  30 g Oral BID  . lisinopril  20 mg Per NG tube Daily  . polyethylene glycol  17 g Oral Daily  . potassium chloride  40 mEq Oral Daily  . senna-docusate  1 tablet Oral BID  . simvastatin  5 mg Per NG tube QHS  . sodium chloride  3 mL Intravenous Q12H  . sodium phosphate  1 enema Rectal TID  . DISCONTD:  lactulose  30 g Per Tube BID  . DISCONTD: senna-docusate  1 tablet Per NG tube BID  . DISCONTD: sorbitol, milk of mag, mineral oil, glycerin (SMOG) enema  960 mL Rectal Once    Assessment/Plan Partial small bowel obstruction, in virgin abdomen.  Last CT scan - 07/22/2011 - persist ant SBO, very redundant colon  Constipation  History of Hypertension COPD (chronic obstructive pulmonary disease) ()  . Diabetes mellitus  Schizophrenia  Chronic left ext iliac artery occlusion    Plan:  DR Isidoro Donning has him on aggressive bowel program, and low carb diet.  Call us if we can be of any further assistance.     LOS: 9 days    Vetra Shinall 07/29/2011

## 2011-07-29 NOTE — Progress Notes (Signed)
Patient ID: Craig Bentley  male  ZOX:[id number]    DOB: March 19, 1955    DOA: 07/20/2011  PCP: No primary provider on file.  Subjective: Tolerating full liquid diet, no BM yet  Objective: Weight change:   Intake/Output Summary (Last 24 hours) at 07/29/11 1329 Last data filed at 07/29/11 1100  Gross per 24 hour  Intake 6979.25 ml  Output    900 ml  Net 6079.25 ml   Blood pressure 109/66, pulse 77, temperature 97.5 F (36.4 C), temperature source Oral, resp. rate 18, height 5\' 9"  (1.753 m), weight 73.465 kg (161 lb 15.4 oz), SpO2 95.00%.  Physical Exam: General: Alert and awake, oriented x3, not in any acute distress. HEENT: anicteric sclera, pupils reactive to light and accommodation, EOMI CVS: S1-S2 clear, no murmur rubs or gallops Chest: clear to auscultation bilaterally, no wheezing, rales or rhonchi Abdomen: soft nontender, +distended, normal bowel sounds, no organomegaly Extremities: no cyanosis, clubbing or edema noted bilaterally Neuro: Cranial nerves II-XII intact, no focal neurological deficits  Lab Results: Basic Metabolic Panel:  Lab 07/28/11 8119 07/27/11 0435  NA 138 139  K 4.2 3.2*  CL 105 105  CO2 23 24  GLUCOSE 85 79  BUN 8 14  CREATININE 0.69 0.71  CALCIUM 9.0 8.6  MG -- --  PHOS -- --   Liver Function Tests:  Lab 07/23/11 0430  AST 16  ALT 14  ALKPHOS 68  BILITOT 0.4  PROT 7.1  ALBUMIN 3.5   CBC:  Lab 07/28/11 0455 07/27/11 0435  WBC 8.2 8.2  NEUTROABS -- 5.8  HGB 12.5* 12.0*  HCT 39.2 37.8*  MCV 86.2 86.1  PLT 229 205   CBG:  Lab 07/29/11 1131 07/29/11 0709 07/28/11 2236 07/28/11 1723 07/28/11 1233  GLUCAP 117* 125* 140* 120* 122*     Micro Results: No results found for this or any previous visit (from the past 240 hour(s)).  Studies/Results: Ct Abdomen Pelvis W Contrast  07/22/2011  *RADIOLOGY REPORT*  Clinical Data: Follow-up small bowel obstruction, clinically improving.  CT ABDOMEN AND PELVIS WITH CONTRAST  Technique:   Multidetector CT imaging of the abdomen and pelvis was performed following the standard protocol during bolus administration of intravenous contrast.  Contrast: OMNIPAQUE IOHEXOL 300 MG/ML  SOLN  Comparison: 07/20/2011.  Findings: Interval nasogastric tube with its tip in the mid to distal stomach.  The stomach is dilated, with improvement.  Mildly progressive proximal small bowel dilatation with continued normal caliber distal small bowel loops.  Prominent stool is again demonstrated in the colon.  Normal appearing appendix.  Poorly distended gallbladder with mild diffuse wall enhancement without abnormal wall thickening. Unremarkable liver, spleen, pancreas, kidneys and urinary bladder. Normal sized prostate gland containing a coarse calcification.  The previously seen free peritoneal fluid in the pelvis is not currently visualized.  Previously described bilateral adrenal masses are unchanged. Previously noted chronic left external iliac occlusion.  No enlarged lymph nodes.  Bullous changes are again noted at the lung bases.  Mild lumbar and lower thoracic spine degenerative changes.  IMPRESSION:  1.  Mildly progressive small bowel obstruction. 2.  Partial decompression of the stomach following nasogastric tube placement. 3.  Continued prominent stool in redundant colon. 4.  Stable adrenal masses. 5.  COPD. 6.  Chronic left external iliac artery occlusion.  Original Report Authenticated By: Darrol Angel, M.D.   Ct Abdomen Pelvis W Contrast  07/20/2011  *RADIOLOGY REPORT*  Clinical Data: 56 year old male with nausea and vomiting. History of  small bowel obstruction.  CT ABDOMEN AND PELVIS WITH CONTRAST  Technique:  Multidetector CT imaging of the abdomen and pelvis was performed following the standard protocol during bolus administration of intravenous contrast.  Contrast: OMNIPAQUE IOHEXOL 300 MG/ML  SOLN  Comparison: Acute abdominal series 07/19/2011 and earlier.  Findings: Chronic scarring and  cystic lung disease at the lung bases.  No pericardial or pleural effusion. No acute osseous abnormality identified.  Small volume of pelvic free fluid.  Decompressed rectum.  Redundant sigmoid colon extends into the right lower quadrant with focal gaseous distention, is decompressed in the central pelvis, and then demonstrates gaseous distention also in the left lower quadrant. The descending colon has a normal caliber and contains stool.  The splenic flexure and transverse colon are redundant containing stool.  The proximal transverse colon and hepatic flexure mildly dilated and contains stool.  The cecum and appendix are normal except for retained stool.  The terminal ileum is decompressed.  Trace free fluid in the left pericolic gutter.  Markedly distended stomach with an air-fluid level.  The duodenum is moderately distended.  There is a small duodenal diverticulum containing air and fluid.  The jejunum is distended up to 5 cm diameter.  Multiple jejunal loops show fluid distention and air-fluid levels.  Gas distended more distal small bowel loops are present in the ventral and right abdomen with two-view focal transition point demonstrated on series 2 images 46 and 49 (mildly gas distended intervening loop between the to the transition. Numerous ileal loops then are decompressed.  But do contain some gas.  No small bowel mesenteric fluid or mesenteric stranding.  The portal venous system appears patent.  The liver, gallbladder, spleen, pancreas and kidneys are within normal limits.  Bilateral adrenal nodules are present and have increased since 2011 and show a moderate to high level of enhancement.  The larger is on the left measuring 17 x 20 mm.  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  Flow is recanalized at the left femoral artery bifurcation as before. Widespread atherosclerosis elsewhere.  IMPRESSION:  1.  Small bowel obstruction with subsequent gastric and duodenal distention with air and  fluid.  Two adjacent transition points in the right abdomen (see images 46 and 49) with decompressed ileum. 2.  Colon is chronically redundant and distended with stool. 3.  Trace free fluid in the left pericolic gutter and pelvis likely is reactive. 4.  Indeterminate bilateral adrenal nodules, on the left measuring 17 x 20 mm.  Given the density on this study, follow-up nonemergent adrenal mass protocol CT may best characterize further. 5.  Chronic left iliac artery occlusion. 6.  Chronic lung base disease.  Original Report Authenticated By: Harley Hallmark, M.D.   Dg Abd 2 Views  07/26/2011  *RADIOLOGY REPORT*  Clinical Data: Small bowel obstruction, fecal impaction, follow-up  ABDOMEN - 2 VIEW  Comparison: Abdomen films of 07/19/2011 and CT of the abdomen and pelvis of 07/22/2011  Findings: The previously noted small bowel obstruction has largely been decompressed with NG tube present.  However slightly prompt small bowel loops do remain in the mid abdomen.  There is a large amount of feces throughout the colon most consistent with constipation.  No colonic obstruction or free intraperitoneal air is evident.  There is linear atelectasis at the left lung base. Heart size is stable.  IMPRESSION:  1.  Significant improvement in small bowel obstruction with slightly prominent small bowel loops remaining.  NG tube tip in  body of stomach. 2.  Large amount of feces throughout the colon.  No colon obstruction or free air.  Original Report Authenticated By: Juline Patch, M.D.   Dg Abd Acute W/chest  07/19/2011  *RADIOLOGY REPORT*  Clinical Data: Abdominal distension, new onset abdominal pain  ACUTE ABDOMEN SERIES (ABDOMEN 2 VIEW & CHEST 1 VIEW)  Comparison: Abdominal radiographs dated 11/02/2010  Findings: Lungs are essentially clear. No pleural effusion or pneumothorax.  Cardiomediastinal silhouette is within normal limits.  Multiple dilated loops of small bowel in the mid abdomen, measuring up to 5.4 cm.  Colon is  not decompressed.  Moderate stool in the left colon.  Mild degenerative changes of the visualized thoracolumbar spine.  IMPRESSION: Multiple dilated loops of small bowel in the mid abdomen, measuring up to 5.4 cm.  Moderate stool in the left colon, which is not decompressed.  This appearance could reflect early/partial small bowel obstruction or adynamic ileus.  No evidence of acute cardiopulmonary disease.  Original Report Authenticated By: Charline Bills, M.D.    Medications: Scheduled Meds:    . aspirin EC  81 mg Oral Daily  . benztropine  1 mg Per NG tube BID  . bisacodyl  10 mg Rectal Daily  . cloZAPine  100 mg Per NG tube BID WC  . cloZAPine  400 mg Per NG tube QHS  . enoxaparin  40 mg Subcutaneous Q24H  . haloperidol  10 mg Per Tube QHS  . haloperidol  5 mg Per Tube Daily  . lactulose  30 g Oral BID  . lisinopril  20 mg Per NG tube Daily  . polyethylene glycol  17 g Oral Daily  . potassium chloride  40 mEq Oral Daily  . senna-docusate  1 tablet Oral BID  . simvastatin  5 mg Per NG tube QHS  . sodium chloride  3 mL Intravenous Q12H  . sodium phosphate  1 enema Rectal TID  . DISCONTD: sorbitol, milk of mag, mineral oil, glycerin (SMOG) enema  960 mL Rectal Once   Continuous Infusions:    . 0.9 % NaCl with KCl 40 mEq / L 100 mL/hr (07/28/11 1610)     Assessment/Plan:  Partial small bowel obstruction:  Clinically resolved after a large bowel movement, patient requesting solid diet -  continue aggressive bowel regimen, Fleet enemas prn. - NGT discontinued yesterday - tolerating full liquid diet, advance to solids today - general surgery following   Active Problems:  Leukocytosis: Resolved, likely secondary to primary problem   Hypokalemia: Resolved   Hypertension: Controlled   Chronic respiratory failure secondary to COPD (chronic obstructive pulmonary disease): Appears to be stable  - pt is maintaining oxygen saturations > 95%   Diabetes mellitus, uncontrolled  and with complications, Gastroparesis: blood sugars controlled  - continue Insulin SSI   Schizophrenia  - clinically stable   DVT Prophylaxis: Lovenox  Code Status: Full code  Disposition: Hopefully in 24hrs if tolerating solid diet    LOS: 9 days   Keyra Virella M.D. [facility] 07/29/2011, 1:29 PM Pager: [phone]

## 2011-07-29 NOTE — Progress Notes (Signed)
Patient interviewed and examined, agree with PA note above.  Mariella Saa MD, FACS  07/29/2011 2:11 PM

## 2011-07-30 DIAGNOSIS — E1165 Type 2 diabetes mellitus with hyperglycemia: Secondary | ICD-10-CM

## 2011-07-30 DIAGNOSIS — K5669 Other intestinal obstruction: Secondary | ICD-10-CM

## 2011-07-30 DIAGNOSIS — E118 Type 2 diabetes mellitus with unspecified complications: Secondary | ICD-10-CM

## 2011-07-30 DIAGNOSIS — R112 Nausea with vomiting, unspecified: Secondary | ICD-10-CM

## 2011-07-30 DIAGNOSIS — I10 Essential (primary) hypertension: Secondary | ICD-10-CM

## 2011-07-30 LAB — GLUCOSE, CAPILLARY: Glucose-Capillary: 125 mg/dL — ABNORMAL HIGH (ref 70–99)

## 2011-07-30 MED ORDER — FLEET ENEMA 7-19 GM/118ML RE ENEM: 1.0000 | ENEMA | Freq: Every day | RECTAL | Status: DC | PRN

## 2011-07-30 MED ORDER — LACTULOSE 10 GM/15ML PO SOLN: 45.0000 mL | Freq: Two times a day (BID) | ORAL | Status: DC

## 2011-07-30 MED ORDER — BISACODYL 10 MG RE SUPP: 10.0000 mg | Freq: Every day | RECTAL | Status: AC

## 2011-07-30 MED ORDER — POLYETHYLENE GLYCOL 3350 17 G PO PACK: 17.0000 g | PACK | Freq: Two times a day (BID) | ORAL | Status: AC

## 2011-07-30 MED ORDER — SENNOSIDES-DOCUSATE SODIUM 8.6-50 MG PO TABS: 1.0000 | ORAL_TABLET | Freq: Two times a day (BID) | ORAL | Status: AC

## 2011-07-30 NOTE — Discharge Summary (Signed)
Physician Discharge Summary  Patient ID: Craig Bentley MRN: [id number] DOB/AGE: 1955/04/23 56 y.o.  Admit date: 07/20/2011 Discharge date: 07/30/2011  Primary Care Physician:  No primary provider on file.  Discharge Diagnoses:    .Leukocytosis .Hypokalemia .Partial small bowel obstruction: resolved .Hypertension .COPD (chronic obstructive pulmonary disease) .Diabetes mellitus .Schizophrenia Severe constipation  Consults: General surgery  Discharge Medications: Medication List  As of 07/30/2011  2:02 PM   STOP taking these medications         docusate sodium 100 MG capsule      hydrochlorothiazide 12.5 MG capsule         TAKE these medications         albuterol 108 (90 BASE) MCG/ACT inhaler   Commonly known as: PROVENTIL HFA;VENTOLIN HFA   Inhale 2 puffs into the lungs every 4 (four) hours as needed. For cough/shortness of breath      aspirin EC 81 MG tablet   Take 81 mg by mouth daily. 30 minutes prior to niaspan      benztropine 1 MG tablet   Commonly known as: COGENTIN   Take 1 mg by mouth 2 (two) times daily.      bisacodyl 10 MG suppository   Commonly known as: DULCOLAX   Place 1 suppository (10 mg total) rectally daily. For constipation      cloZAPine 100 MG tablet   Commonly known as: CLOZARIL   Take 100-400 mg by mouth See admin instructions. Patient takes 1 tablet by mouth twice a day at 8am and 4pm and then takes 400mg  (4 tablets) by mouth every night at bedtime      guaiFENesin 100 MG/5ML Soln   Commonly known as: ROBITUSSIN   Take 10 mLs by mouth every 4 (four) hours as needed. For cough.      haloperidol 5 MG tablet   Commonly known as: HALDOL   Take 5-10 mg by mouth See admin instructions. Patient takes 1 tablet by mouth every morning and 2 tablets by mouth at bedtime      lactulose 10 GM/15ML solution   Commonly known as: CHRONULAC   Take 45 mLs (30 g total) by mouth 2 (two) times daily.      lisinopril 20 MG tablet   Commonly known as:  PRINIVIL,ZESTRIL   Take 20 mg by mouth daily.      metFORMIN 500 MG tablet   Commonly known as: GLUCOPHAGE   Take 500 mg by mouth 2 (two) times daily with a meal.      multivitamin with minerals Tabs   Take 1 tablet by mouth daily.      niacin 500 MG CR tablet   Commonly known as: NIASPAN   Take 500 mg by mouth at bedtime.      polyethylene glycol packet   Commonly known as: MIRALAX / GLYCOLAX   Take 17 g by mouth 2 (two) times daily. For constipation      senna-docusate 8.6-50 MG per tablet   Commonly known as: Senokot-S   Take 1 tablet by mouth 2 (two) times daily. For constipation      simvastatin 5 MG tablet   Commonly known as: ZOCOR   Take 5 mg by mouth at bedtime.      sodium phosphate 7-19 GM/118ML Enem   Place 1 enema rectally daily as needed. If no BM more than 24hours             Brief H and P: For complete details please refer to admission  H and P, but in brief Pt is 56 yo male with multiple and complex medical history who presented to [facility] ED with main concern of progressively worsening, sharp and generalized and mostly periumbilical abdominal pain that was radiating to bilateral groin area and back area. This initially started 1 day prior to admission and was associated with nausea, vomiting. He denied any specific aggravating or alleviating factors, no fevers, no chills, no urinary concerns, no headaches and no other systemic symptoms, no similar episodes in the past. Pt denied any recent sicknesses or hospitalizations, no sick contacts or exposures.   Hospital Course:   Partial small bowel obstruction with obstipation: Patient was admitted with abdominal pain, nausea and vomiting and during hospitalization had no improvement with his abdominal distention or ability to move his bowels which prompted general surgery consultation. Patient had CT scan of the abdomen and pelvis which showed large amount of stool in his colon but also progression of small bowel  dilatation and potential obstruction. Patient has a prior history of fecal impaction but no previous history of bowel obstruction or surgical history. Patient was conservatively managed and did not require any surgical intervention. NGT was placed with improvement in his clinical symptoms, by 07/27/11 patient had 3 bowel movements with his enema's. The patient had serial abdominal x-rays. SBO has clinically resolved after a large bowel movement and patient is currently tolerating solid diet. He will remain on aggressive bowel regimen, Fleet enemas when necessary and low-carb diet.  Leukocytosis: Resolved, likely secondary to primary problem   Hypokalemia: Resolved   Hypertension: Remained controlled  Chronic respiratory failure secondary to COPD (chronic obstructive pulmonary disease): Remained stable, no acute issues during hospitalization,  pt is maintaining oxygen saturations > 95%   Diabetes mellitus, uncontrolled and with complications, Gastroparesis: blood sugars controlled   Schizophrenia - clinically stable   Day of Discharge BP 118/72  Pulse 95  Temp(Src) 98.5 F (36.9 C) (Oral)  Resp 20  Ht 5\' 9"  (1.753 m)  Wt 73.465 kg (161 lb 15.4 oz)  BMI 23.92 kg/m2  SpO2 96%  Physical Exam: General: Alert and awake oriented x3 not in any acute distress. HEENT: anicteric sclera, pupils reactive to light and accommodation CVS: S1-S2 clear no murmur rubs or gallops Chest: clear to auscultation bilaterally, no wheezing rales or rhonchi Abdomen: soft nontender, mild distended, normal bowel sounds, no organomegaly Extremities: no cyanosis, clubbing or edema noted bilaterally Neuro: Cranial nerves II-XII intact, no focal neurological deficits   The results of significant diagnostics from this hospitalization (including imaging, microbiology, ancillary and laboratory) are listed below for reference.    LAB RESULTS: Basic Metabolic Panel:  Lab 07/28/11 1610 07/27/11 0435  NA 138 139  K  4.2 3.2*  CL 105 105  CO2 23 24  GLUCOSE 85 79  BUN 8 14  CREATININE 0.69 0.71  CALCIUM 9.0 8.6  MG -- --  PHOS -- --   CBC:  Lab 07/28/11 0455 07/27/11 0435  WBC 8.2 8.2  NEUTROABS -- 5.8  HGB 12.5* 12.0*  HCT 39.2 37.8*  MCV 86.2 --  PLT 229 205   CBG:  Lab 07/30/11 1126 07/30/11 0706  GLUCAP 136* 125*    Significant Diagnostic Studies:  Ct Abdomen Pelvis W Contrast  07/20/2011  *RADIOLOGY REPORT*  Clinical Data: 56 year old male with nausea and vomiting. History of small bowel obstruction.  CT ABDOMEN AND PELVIS WITH CONTRAST  Technique:  Multidetector CT imaging of the abdomen and pelvis was performed following the  standard protocol during bolus administration of intravenous contrast.  Contrast: OMNIPAQUE IOHEXOL 300 MG/ML  SOLN  Comparison: Acute abdominal series 07/19/2011 and earlier.  Findings: Chronic scarring and cystic lung disease at the lung bases.  No pericardial or pleural effusion. No acute osseous abnormality identified.  Small volume of pelvic free fluid.  Decompressed rectum.  Redundant sigmoid colon extends into the right lower quadrant with focal gaseous distention, is decompressed in the central pelvis, and then demonstrates gaseous distention also in the left lower quadrant. The descending colon has a normal caliber and contains stool.  The splenic flexure and transverse colon are redundant containing stool.  The proximal transverse colon and hepatic flexure mildly dilated and contains stool.  The cecum and appendix are normal except for retained stool.  The terminal ileum is decompressed.  Trace free fluid in the left pericolic gutter.  Markedly distended stomach with an air-fluid level.  The duodenum is moderately distended.  There is a small duodenal diverticulum containing air and fluid.  The jejunum is distended up to 5 cm diameter.  Multiple jejunal loops show fluid distention and air-fluid levels.  Gas distended more distal small bowel loops are present  in the ventral and right abdomen with two-view focal transition point demonstrated on series 2 images 46 and 49 (mildly gas distended intervening loop between the to the transition. Numerous ileal loops then are decompressed.  But do contain some gas.  No small bowel mesenteric fluid or mesenteric stranding.  The portal venous system appears patent.  The liver, gallbladder, spleen, pancreas and kidneys are within normal limits.  Bilateral adrenal nodules are present and have increased since 2011 and show a moderate to high level of enhancement.  The larger is on the left measuring 17 x 20 mm.  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  Flow is recanalized at the left femoral artery bifurcation as before. Widespread atherosclerosis elsewhere.  IMPRESSION:  1.  Small bowel obstruction with subsequent gastric and duodenal distention with air and fluid.  Two adjacent transition points in the right abdomen (see images 46 and 49) with decompressed ileum. 2.  Colon is chronically redundant and distended with stool. 3.  Trace free fluid in the left pericolic gutter and pelvis likely is reactive. 4.  Indeterminate bilateral adrenal nodules, on the left measuring 17 x 20 mm.  Given the density on this study, follow-up nonemergent adrenal mass protocol CT may best characterize further. 5.  Chronic left iliac artery occlusion. 6.  Chronic lung base disease.  Original Report Authenticated By: Harley Hallmark, M.D.     Disposition and Follow-up: Discharge Orders    Future Orders Please Complete By Expires   Diet Carb Modified      Increase activity slowly          DISPOSITION: ALF  DIET: Carb modified  ACTIVITY: As tolerated  DISCHARGE FOLLOW-UP Follow-up Information    Please follow up. (Please call 1-800 Medicaid number and request a  primary care physician for  follow-up)          Time spent on Discharge:   Signed:  Mayford Alberg M.D. [facility] 07/30/2011, 2:02 PM

## 2011-07-30 NOTE — Progress Notes (Signed)
Report called and given to nurse at [facility]. Awaiting pick up.

## 2011-07-30 NOTE — Progress Notes (Signed)
Patient discharged to [facility] in stable condition and in no acute distress. Picked up by [facility].  VS 118/72, 98.5, 95, 20 prior to discharge

## 2011-07-30 NOTE — Progress Notes (Signed)
Patient cleared for discharge. Patient is from [facility]. fl2 completed and signed. Packet copied. Facility will pick patient up.  Sefora Tietje C. Lio Wehrly MSW, LCSW [phone]

## 2011-12-02 ENCOUNTER — Emergency Department (HOSPITAL_COMMUNITY): Payer: Medicaid Other

## 2011-12-02 ENCOUNTER — Emergency Department (HOSPITAL_COMMUNITY)
Admission: EM | Admit: 2011-12-02 | Discharge: 2011-12-03 | Disposition: A | Discharge status: Home / Self Care | Payer: Medicaid Other | Attending: Emergency Medicine | Admitting: Emergency Medicine

## 2011-12-02 DIAGNOSIS — Z79899 Other long term (current) drug therapy: Secondary | ICD-10-CM | POA: Insufficient documentation

## 2011-12-02 DIAGNOSIS — F17200 Nicotine dependence, unspecified, uncomplicated: Secondary | ICD-10-CM | POA: Insufficient documentation

## 2011-12-02 DIAGNOSIS — R300 Dysuria: Secondary | ICD-10-CM | POA: Insufficient documentation

## 2011-12-02 DIAGNOSIS — R059 Cough, unspecified: Secondary | ICD-10-CM | POA: Insufficient documentation

## 2011-12-02 DIAGNOSIS — J4489 Other specified chronic obstructive pulmonary disease: Secondary | ICD-10-CM | POA: Insufficient documentation

## 2011-12-02 DIAGNOSIS — I10 Essential (primary) hypertension: Secondary | ICD-10-CM | POA: Insufficient documentation

## 2011-12-02 DIAGNOSIS — R05 Cough: Secondary | ICD-10-CM | POA: Insufficient documentation

## 2011-12-02 DIAGNOSIS — R509 Fever, unspecified: Secondary | ICD-10-CM | POA: Insufficient documentation

## 2011-12-02 DIAGNOSIS — Z7982 Long term (current) use of aspirin: Secondary | ICD-10-CM | POA: Insufficient documentation

## 2011-12-02 DIAGNOSIS — E119 Type 2 diabetes mellitus without complications: Secondary | ICD-10-CM | POA: Insufficient documentation

## 2011-12-02 DIAGNOSIS — Z8659 Personal history of other mental and behavioral disorders: Secondary | ICD-10-CM | POA: Insufficient documentation

## 2011-12-02 DIAGNOSIS — J189 Pneumonia, unspecified organism: Secondary | ICD-10-CM

## 2011-12-02 DIAGNOSIS — IMO0001 Reserved for inherently not codable concepts without codable children: Secondary | ICD-10-CM | POA: Insufficient documentation

## 2011-12-02 DIAGNOSIS — J449 Chronic obstructive pulmonary disease, unspecified: Secondary | ICD-10-CM | POA: Insufficient documentation

## 2011-12-02 DIAGNOSIS — R000 Tachycardia, unspecified: Secondary | ICD-10-CM | POA: Insufficient documentation

## 2011-12-02 LAB — URINALYSIS, ROUTINE W REFLEX MICROSCOPIC
Bilirubin Urine: NEGATIVE
Glucose, UA: NEGATIVE mg/dL
Hgb urine dipstick: NEGATIVE
Ketones, ur: NEGATIVE mg/dL
Protein, ur: NEGATIVE mg/dL
pH: 5.5 (ref 5.0–8.0)

## 2011-12-02 LAB — BASIC METABOLIC PANEL
BUN: 8 mg/dL (ref 6–23)
Creatinine, Ser: 0.65 mg/dL (ref 0.50–1.35)
GFR calc Af Amer: >90 mL/min (ref >90)
GFR calc non Af Amer: >90 mL/min (ref >90)
Glucose, Bld: 115 mg/dL — ABNORMAL HIGH (ref 70–99)
Potassium: 3.4 mEq/L — ABNORMAL LOW (ref 3.5–5.1)

## 2011-12-02 LAB — CBC
HCT: 32.5 % — ABNORMAL LOW (ref 39.0–52.0)
Hemoglobin: 10.7 g/dL — ABNORMAL LOW (ref 13.0–17.0)
MCH: 28 pg (ref 26.0–34.0)
MCHC: 32.9 g/dL (ref 30.0–36.0)
MCV: 85.1 fL (ref 78.0–100.0)
RDW: 12.9 % (ref 11.5–15.5)

## 2011-12-02 LAB — URINE MICROSCOPIC-ADD ON

## 2011-12-02 MED ORDER — DEXTROSE 5 % IV SOLN
1.0000 g | Freq: Once | INTRAVENOUS | Status: AC
  Administered 2011-12-02: 1 g via INTRAVENOUS
  Filled 2011-12-02: qty 10

## 2011-12-02 MED ORDER — AZITHROMYCIN 250 MG PO TABS: ORAL_TABLET | ORAL | Status: DC

## 2011-12-02 MED ORDER — AZITHROMYCIN 250 MG PO TABS
500.0000 mg | ORAL_TABLET | Freq: Once | ORAL | Status: AC
  Administered 2011-12-02: 500 mg via ORAL
  Filled 2011-12-02: qty 2

## 2011-12-02 MED ORDER — IBUPROFEN 800 MG PO TABS
800.0000 mg | ORAL_TABLET | Freq: Once | ORAL | Status: AC
  Administered 2011-12-02: 800 mg via ORAL
  Filled 2011-12-02: qty 1

## 2011-12-02 MED ORDER — SODIUM CHLORIDE 0.9 % IV BOLUS (SEPSIS)
1000.0000 mL | Freq: Once | INTRAVENOUS | Status: AC
  Administered 2011-12-02: 1000 mL via INTRAVENOUS

## 2011-12-02 NOTE — ED Notes (Signed)
WUJ:[id number]<JY> Expected date:12/02/11<BR> Expected time: 6:27 PM<BR> Means of arrival:<BR> Comments:<BR> 36 male burning urination

## 2011-12-02 NOTE — ED Provider Notes (Signed)
Craig Bentley S 8:00PM  patient discussed in sign out with [employer].  Patient presenting with complaints of persistent productive cough, fever chills and dysuria symptoms. Chest x-ray concerning for right middle lobe pneumonia. UA and BMP is pending.  We'll begin treatment for her CAP with Rocephin and azithromycin.   UA without any significant signs concerning for UTI. Urine culture sent. At this time we'll continue treatment for CAP.  Angus Seller, PA 12/03/11 [phone]

## 2011-12-02 NOTE — ED Provider Notes (Signed)
Medical screening examination/treatment/procedure(s) were performed by non-physician practitioner and as supervising physician I was immediately available for consultation/collaboration.    Nelia Shi, MD 12/02/11 2022

## 2011-12-02 NOTE — ED Notes (Signed)
Pt waiting on [facility] to take him home

## 2011-12-02 NOTE — ED Notes (Signed)
CBG= 121

## 2011-12-02 NOTE — ED Provider Notes (Signed)
History     CSN: [id number]  Arrival date & time 12/02/11  4098   First MD Initiated Contact with Patient 12/02/11 1858      Chief Complaint  Patient presents with  . Dysuria    (Consider location/radiation/quality/duration/timing/severity/associated sxs/prior treatment) HPI Comments: Patient is a 56 year old male with a history of schizophrenia, diabetes, hypertension and COPD that presents emergency department complaining of cough and dysuria.  Patient states that he is a current everyday smoker and has been coughing for months however today the coughing has worsened which is why he called the EMS.  Associated symptoms include fever & chills. Patient denies any shortness of breath, chest pain, hemoptysis, leg swelling, orthopnea, PND change in bowel movements or abdominal pain. Pt denies being sexually active or having abnormal discharge.   Patient is a 56 y.o. male presenting with dysuria. The history is provided by the patient.  Dysuria  Associated symptoms include chills. Pertinent negatives include no frequency and no urgency.    Past Medical History  Diagnosis Date  . Hypertension   . Diabetes mellitus   . Schizophrenia, schizo-affective   . COPD (chronic obstructive pulmonary disease)   . Impacted fece     No past surgical history on file.  No family history on file.  History  Substance Use Topics  . Smoking status: Current Every Day Smoker -- 0.5 packs/day  . Smokeless tobacco: Not on file  . Alcohol Use: Yes      Review of Systems  Constitutional: Positive for chills. Negative for fever, diaphoresis, activity change and appetite change.  HENT: Negative for congestion and neck pain.   Eyes: Negative for visual disturbance.  Respiratory: Positive for cough. Negative for chest tightness and shortness of breath.   Cardiovascular: Negative for chest pain and leg swelling.  Gastrointestinal: Negative for abdominal pain.  Genitourinary: Positive for dysuria.  Negative for urgency and frequency.  Musculoskeletal: Positive for myalgias.  Skin: Negative for color change and wound.  Neurological: Negative for dizziness, syncope, weakness, light-headedness, numbness and headaches.  Psychiatric/Behavioral: Negative for confusion.  All other systems reviewed and are negative.    Allergies  Review of patient's allergies indicates no known allergies.  Home Medications   Current Outpatient Rx  Name Route Sig Dispense Refill  . ASPIRIN EC 81 MG PO TBEC Oral Take 81 mg by mouth daily. 30 minutes prior to niaspan    . BENZTROPINE MESYLATE 1 MG PO TABS Oral Take 1 mg by mouth 2 (two) times daily.    Marland Kitchen CLOZAPINE 100 MG PO TABS Oral Take 100-400 mg by mouth 3 (three) times daily. Patient takes 1 tablet by mouth twice a day at 8am and 4pm and then takes 400mg  (4 tablets) by mouth every night at bedtime    . GUAIFENESIN 100 MG/5ML PO SOLN Oral Take 10 mLs by mouth every 4 (four) hours as needed. For cough.    Marland Kitchen HALOPERIDOL 5 MG PO TABS Oral Take 5-10 mg by mouth 2 (two) times daily. Patient takes 1 tablet by mouth every morning and 2 tablets by mouth at bedtime    . LACTULOSE 10 GM/15ML PO SOLN Oral Take 45 mLs (30 g total) by mouth 2 (two) times daily. 240 mL 3  . LISINOPRIL 20 MG PO TABS Oral Take 20 mg by mouth daily.     Marland Kitchen METFORMIN HCL 500 MG PO TABS Oral Take 500 mg by mouth 2 (two) times daily with a meal.    . ADULT MULTIVITAMIN  W/MINERALS CH Oral Take 1 tablet by mouth daily.    Marland Kitchen NIACIN ER (ANTIHYPERLIPIDEMIC) 500 MG PO TBCR Oral Take 500 mg by mouth at bedtime.    Marland Kitchen PHOSPHATE ENEMA 7-19 GM/118ML RE ENEM Rectal Place 1 enema rectally daily as needed. For constipation.    Marland Kitchen POLYETHYLENE GLYCOL 3350 PO PACK Oral Take 17 g by mouth 2 (two) times daily as needed. For constipation.    Bernadette Hoit SODIUM 8.6-50 MG PO TABS Oral Take 1 tablet by mouth 2 (two) times daily. For constipation 60 tablet 3  . SIMVASTATIN 5 MG PO TABS Oral Take 5 mg by  mouth at bedtime.    . ALBUTEROL SULFATE HFA 108 (90 BASE) MCG/ACT IN AERS Inhalation Inhale 2 puffs into the lungs every 4 (four) hours as needed. For cough/shortness of breath      BP 136/76  Pulse 102  Temp 100.2 F (37.9 C) (Oral)  Resp 22  SpO2 95%  Physical Exam  Constitutional: He is oriented to person, place, and time. He appears well-developed and well-nourished. No distress.  HENT:  Head: Normocephalic and atraumatic.  Mouth/Throat: Oropharynx is clear and moist. No oropharyngeal exudate.       Edentulous, gums non ttp. Uvula midline, no tonsilar swelling or exudate.   Eyes: Conjunctivae normal and EOM are normal. Pupils are equal, round, and reactive to light. No scleral icterus.  Neck: Normal range of motion. Neck supple. No tracheal deviation present. No thyromegaly present.       No cervical lymphadenopathy  Cardiovascular: Normal rate, regular rhythm, normal heart sounds and intact distal pulses.        Mild tachycardia, RRR, no aberancy on auscultation  Pulmonary/Chest: Effort normal and breath sounds normal. No stridor. No respiratory distress. He has no wheezes.       LCAB, no respiratory distress.   Abdominal: Soft.  Musculoskeletal: Normal range of motion. He exhibits no edema and no tenderness.  Neurological: He is alert and oriented to person, place, and time. Coordination normal.  Skin: Skin is warm and dry. No rash noted. He is not diaphoretic. No erythema. No pallor.  Psychiatric: He has a normal mood and affect. His behavior is normal.    ED Course  Procedures (including critical care time)  Labs Reviewed  CBC - Abnormal; Notable for the following:    RBC 3.82 (*)     Hemoglobin 10.7 (*)     HCT 32.5 (*)     All other components within normal limits  URINALYSIS, ROUTINE W REFLEX MICROSCOPIC  BASIC METABOLIC PANEL   No results found.   No diagnosis found.    MDM  57 year old male current everyday smoker with a history of COPD and  schizophrenia presents complaining of persistent cough and dysuria.  Chest x-ray concerning for right mid lung pneumonia and mild anemia seen on CBC.  Urinalysis and be met pending.  Patient care resumed by Jerral Ralph.  Disposition pending further results.        Jaci Carrel, [place] 12/02/11 2021

## 2011-12-02 NOTE — ED Notes (Signed)
Patient complaining of burning with urination, fever, chills, productive cough. Rhonchi noted by EMS.

## 2011-12-04 LAB — URINE CULTURE: Culture: NO GROWTH

## 2012-04-03 ENCOUNTER — Emergency Department (HOSPITAL_COMMUNITY)
Admission: EM | Admit: 2012-04-03 | Discharge: 2012-04-03 | Disposition: A | Discharge status: Home / Self Care | Payer: Medicaid Other | Attending: Emergency Medicine | Admitting: Emergency Medicine

## 2012-04-03 ENCOUNTER — Emergency Department (HOSPITAL_COMMUNITY): Payer: Medicaid Other

## 2012-04-03 DIAGNOSIS — Z7982 Long term (current) use of aspirin: Secondary | ICD-10-CM | POA: Insufficient documentation

## 2012-04-03 DIAGNOSIS — F259 Schizoaffective disorder, unspecified: Secondary | ICD-10-CM | POA: Insufficient documentation

## 2012-04-03 DIAGNOSIS — F17200 Nicotine dependence, unspecified, uncomplicated: Secondary | ICD-10-CM | POA: Insufficient documentation

## 2012-04-03 DIAGNOSIS — K5900 Constipation, unspecified: Secondary | ICD-10-CM | POA: Insufficient documentation

## 2012-04-03 DIAGNOSIS — R109 Unspecified abdominal pain: Secondary | ICD-10-CM | POA: Insufficient documentation

## 2012-04-03 DIAGNOSIS — E119 Type 2 diabetes mellitus without complications: Secondary | ICD-10-CM | POA: Insufficient documentation

## 2012-04-03 DIAGNOSIS — I10 Essential (primary) hypertension: Secondary | ICD-10-CM | POA: Insufficient documentation

## 2012-04-03 DIAGNOSIS — Z79899 Other long term (current) drug therapy: Secondary | ICD-10-CM | POA: Insufficient documentation

## 2012-04-03 DIAGNOSIS — J4489 Other specified chronic obstructive pulmonary disease: Secondary | ICD-10-CM | POA: Insufficient documentation

## 2012-04-03 DIAGNOSIS — J449 Chronic obstructive pulmonary disease, unspecified: Secondary | ICD-10-CM | POA: Insufficient documentation

## 2012-04-03 DIAGNOSIS — Z8719 Personal history of other diseases of the digestive system: Secondary | ICD-10-CM | POA: Insufficient documentation

## 2012-04-03 MED ORDER — POLYETHYLENE GLYCOL 3350 17 G PO PACK: 17.0000 g | PACK | Freq: Two times a day (BID) | ORAL | Status: DC

## 2012-04-03 NOTE — ED Notes (Signed)
Patient returned from X-ray 

## 2012-04-03 NOTE — ED Notes (Signed)
Patient transported to X-ray 

## 2012-04-03 NOTE — ED Notes (Signed)
[facility] brought in by [facility] with complaints of abdominal pain no bowel movement in 6-7 days, feeling nauseated no vomiting, staff members given fleets enema- no results on 04/02/12. Hx- SBO, htn, schizo, impacted feces, etc, vs- 124/80, 88,18rr, 98% sat RA.

## 2012-04-06 NOTE — ED Provider Notes (Signed)
History    56yM with abdominal pain. Crampy. Intermittent. No appreciable exacerbating or relieving factors.  No fever or chills. No urinary complaints. No n/v. Apparently has been geeting fleets enemas with no BM. Lasy BM almost a week ago. Reported hx of SBO. No vomiting.    CSN: [id number]    Arrival date & time 04/03/12  0406   First MD Initiated Contact with Patient 04/03/12 [phone]      Chief Complaint  Patient presents with  . Abdominal Pain    no bm in 6-7 days    (Consider location/radiation/quality/duration/timing/severity/associated sxs/prior treatment) HPI  Past Medical History  Diagnosis Date  . Hypertension   . Diabetes mellitus   . Schizophrenia, schizo-affective   . COPD (chronic obstructive pulmonary disease)   . Impacted fece     No past surgical history on file.  No family history on file.  History  Substance Use Topics  . Smoking status: Current Every Day Smoker -- 0.50 packs/day  . Smokeless tobacco: Not on file  . Alcohol Use: Yes      Review of Systems  All systems reviewed and negative, other than as noted in HPI.   Allergies  Review of patient's allergies indicates no known allergies.  Home Medications   Current Outpatient Rx  Name  Route  Sig  Dispense  Refill  . albuterol (PROVENTIL HFA;VENTOLIN HFA) 108 (90 BASE) MCG/ACT inhaler   Inhalation   Inhale 2 puffs into the lungs every 4 (four) hours as needed. For cough/shortness of breath         . aspirin EC 81 MG tablet   Oral   Take 81 mg by mouth daily. 30 minutes prior to niaspan         . benztropine (COGENTIN) 1 MG tablet   Oral   Take 1 mg by mouth 2 (two) times daily.         . cloZAPine (CLOZARIL) 100 MG tablet   Oral   Take 100-400 mg by mouth 3 (three) times daily. Patient takes 1 tablet by mouth twice a day at 8am and 4pm and then takes 400mg  (4 tablets) by mouth every night at bedtime         . guaiFENesin (ROBITUSSIN) 100 MG/5ML SOLN   Oral   Take 10  mLs by mouth every 4 (four) hours as needed. For cough.         . haloperidol (HALDOL) 5 MG tablet   Oral   Take 5-10 mg by mouth 2 (two) times daily. Patient takes 1 tablet by mouth every morning and 2 tablets by mouth at bedtime         . hydrochlorothiazide (HYDRODIURIL) 25 MG tablet   Oral   Take 25 mg by mouth daily.         Marland Kitchen lactulose (CHRONULAC) 10 GM/15ML solution   Oral   Take 45 mLs (30 g total) by mouth 2 (two) times daily.   240 mL   3   . lisinopril (PRINIVIL,ZESTRIL) 20 MG tablet   Oral   Take 20 mg by mouth daily.          . metFORMIN (GLUCOPHAGE) 500 MG tablet   Oral   Take 500 mg by mouth 2 (two) times daily with a meal.         . Multiple Vitamin (MULITIVITAMIN WITH MINERALS) TABS   Oral   Take 1 tablet by mouth daily.         . niacin (  NIASPAN) 500 MG CR tablet   Oral   Take 500 mg by mouth at bedtime.         . phosphate (FLEET) enema   Rectal   Place 1 enema rectally daily as needed. For constipation.         . polyethylene glycol (MIRALAX / GLYCOLAX) packet   Oral   Take 17 g by mouth 2 (two) times daily as needed. For constipation.         . senna-docusate (SENOKOT-S) 8.6-50 MG per tablet   Oral   Take 1 tablet by mouth 2 (two) times daily. For constipation   60 tablet   3   . simvastatin (ZOCOR) 5 MG tablet   Oral   Take 5 mg by mouth at bedtime.         . polyethylene glycol (MIRALAX / GLYCOLAX) packet   Oral   Take 17 g by mouth 2 (two) times daily.   14 each   0     BP 131/70  Pulse 96  Temp(Src) 98 F (36.7 C) (Oral)  Resp 18  SpO2 97%  Physical Exam  Nursing note and vitals reviewed. Constitutional: He appears well-developed and well-nourished. No distress.  HENT:  Head: Normocephalic and atraumatic.  Eyes: Conjunctivae are normal. Right eye exhibits no discharge. Left eye exhibits no discharge.  Neck: Neck supple.  Cardiovascular: Normal rate, regular rhythm and normal heart sounds.  Exam  reveals no gallop and no friction rub.   No murmur heard. Pulmonary/Chest: Effort normal and breath sounds normal. No respiratory distress.  Abdominal: Soft. He exhibits no distension and no mass. There is no tenderness. There is no guarding.  Genitourinary:  DRE: no external lesions noted. Normal tone. No stool in rectal vault.   Musculoskeletal: He exhibits no edema and no tenderness.  Neurological: He is alert.  Skin: Skin is warm and dry.  Psychiatric: He has a normal mood and affect. His behavior is normal. Thought content normal.    ED Course  Procedures (including critical care time)  Labs Reviewed - No data to display No results found.  Dg Abd Acute W/chest  04/03/2012  *RADIOLOGY REPORT*  Clinical Data: Abdominal pain and distension; constipation. History of smoking.  ACUTE ABDOMEN SERIES (ABDOMEN 2 VIEW & CHEST 1 VIEW)  Comparison: Chest radiograph performed 12/02/2011, and abdominal radiograph performed 07/26/2011  Findings: The lungs are well-aerated.  Minimal right basilar opacity may reflect atelectasis.  There is no evidence of pleural effusion or pneumothorax.  The cardiomediastinal silhouette is within normal limits.  A large amount of stool is noted throughout the colon, compatible with significant constipation.  The small bowel is not definitely characterized.  No free intra-abdominal air is identified on the provided upright view.  No acute osseous abnormalities are seen; the sacroiliac joints are unremarkable in appearance.  IMPRESSION: 1.  Large amount of stool noted throughout the colon, compatible with significant constipation.  No free intra-abdominal air seen.  2.  Minimal right basilar airspace opacity may reflect atelectasis; lungs otherwise clear.   Original Report Authenticated By: Tonia Ghent, M.D.     1. Constipation       MDM  56yM with crampy abdominal pain. Benign exam. No vomiting or distension to suggest SBO. obs series not suggestive either. No  impaction of DRE. Plan bowel regimen.         Raeford Razor, MD 04/06/12 [phone]

## 2012-05-03 ENCOUNTER — Encounter (HOSPITAL_COMMUNITY): Payer: Self-pay | Admitting: Emergency Medicine

## 2012-05-03 ENCOUNTER — Emergency Department (HOSPITAL_COMMUNITY): Payer: Medicaid Other

## 2012-05-03 ENCOUNTER — Emergency Department (HOSPITAL_COMMUNITY)
Admission: EM | Admit: 2012-05-03 | Discharge: 2012-05-03 | Disposition: A | Discharge status: Home / Self Care | Payer: Medicaid Other | Attending: Emergency Medicine | Admitting: Emergency Medicine

## 2012-05-03 DIAGNOSIS — F17200 Nicotine dependence, unspecified, uncomplicated: Secondary | ICD-10-CM | POA: Insufficient documentation

## 2012-05-03 DIAGNOSIS — K5900 Constipation, unspecified: Secondary | ICD-10-CM | POA: Insufficient documentation

## 2012-05-03 DIAGNOSIS — J4489 Other specified chronic obstructive pulmonary disease: Secondary | ICD-10-CM | POA: Insufficient documentation

## 2012-05-03 DIAGNOSIS — I10 Essential (primary) hypertension: Secondary | ICD-10-CM | POA: Insufficient documentation

## 2012-05-03 DIAGNOSIS — Z79899 Other long term (current) drug therapy: Secondary | ICD-10-CM | POA: Insufficient documentation

## 2012-05-03 DIAGNOSIS — E119 Type 2 diabetes mellitus without complications: Secondary | ICD-10-CM | POA: Insufficient documentation

## 2012-05-03 DIAGNOSIS — F259 Schizoaffective disorder, unspecified: Secondary | ICD-10-CM | POA: Insufficient documentation

## 2012-05-03 HISTORY — DX: Constipation, unspecified: K59.00

## 2012-05-03 MED ORDER — POLYETHYLENE GLYCOL 3350 17 GM/SCOOP PO POWD
1.0000 | Freq: Once | ORAL | Status: AC
  Administered 2012-05-03: 34 g via ORAL
  Filled 2012-05-03: qty 255

## 2012-05-03 MED ORDER — MAGNESIUM CITRATE PO SOLN
1.0000 | Freq: Once | ORAL | Status: AC
  Administered 2012-05-03: 1 via ORAL
  Filled 2012-05-03: qty 296

## 2012-05-03 MED ORDER — POLYETHYLENE GLYCOL 3350 17 GM/SCOOP PO POWD: 17.0000 g | Freq: Every day | ORAL | Status: DC

## 2012-05-03 NOTE — ED Notes (Signed)
Pt had a small BM

## 2012-05-03 NOTE — ED Notes (Signed)
Pt to ED via [facility] from [facility] with c/o constipation.  Pt st's bowels have not moved in over a week.  Abd distended.

## 2012-05-03 NOTE — ED Notes (Addendum)
Administer soap suds enema.  Pt tolerated it well.

## 2012-05-03 NOTE — ED Notes (Addendum)
Pt states that he has not had a bowel movement in 1.5 weeks.  He is urinating okay.  Pt took Lactulose last night but it was not effective.

## 2012-05-03 NOTE — ED Provider Notes (Signed)
History     CSN: [id number]  Arrival date & time 05/03/12  0000   First MD Initiated Contact with Patient 05/03/12 0310      Chief Complaint  Patient presents with  . Constipation    (Consider location/radiation/quality/duration/timing/severity/associated sxs/prior treatment) HPI 57 year old male presents emergency apartment with complaint of constipation.  He reports he has not had a bowel movement in the last week, and a half.  He's been taking lactulose each day without improvement in symptoms.  Patient seen in emergency department for same about a month.  A half ago.  Patient denies any abdominal pain.  No nausea or vomiting.  Eating and drinking well.  Patient requesting sandwich, and coffee at this time. Past Medical History  Diagnosis Date  . Hypertension   . Diabetes mellitus   . Schizophrenia, schizo-affective   . COPD (chronic obstructive pulmonary disease)   . Impacted fece   . Constipation     History reviewed. No pertinent past surgical history.  No family history on file.  History  Substance Use Topics  . Smoking status: Current Every Day Smoker -- 0.50 packs/day  . Smokeless tobacco: Not on file  . Alcohol Use: Yes      Review of Systems  All other systems reviewed and are negative.    Allergies  Review of patient's allergies indicates no known allergies.  Home Medications   Current Outpatient Rx  Name  Route  Sig  Dispense  Refill  . acetaminophen (TYLENOL) 325 MG tablet   Oral   Take by mouth every 6 (six) hours as needed for pain or fever (headache).         Marland Kitchen aspirin EC 81 MG tablet   Oral   Take 81 mg by mouth daily. 30 minutes prior to niaspan         . benztropine (COGENTIN) 1 MG tablet   Oral   Take 1 mg by mouth 2 (two) times daily.         . cloZAPine (CLOZARIL) 100 MG tablet   Oral   Take 100-400 mg by mouth 3 (three) times daily. Takes 1 tablet every morning and afternoon Takes 4 tablets at bedtime         .  haloperidol (HALDOL) 5 MG tablet   Oral   Take 5-10 mg by mouth 2 (two) times daily. Patient takes 1 tablet by mouth every morning and 2 tablets by mouth at bedtime         . hydrochlorothiazide (HYDRODIURIL) 25 MG tablet   Oral   Take 25 mg by mouth daily.         Marland Kitchen lactulose (CHRONULAC) 10 GM/15ML solution   Oral   Take 45 mLs (30 g total) by mouth 2 (two) times daily.   240 mL   3   . lisinopril (PRINIVIL,ZESTRIL) 20 MG tablet   Oral   Take 20 mg by mouth daily.          . metFORMIN (GLUCOPHAGE) 500 MG tablet   Oral   Take 500 mg by mouth 2 (two) times daily with a meal.         . Multiple Vitamin (MULITIVITAMIN WITH MINERALS) TABS   Oral   Take 1 tablet by mouth daily.         . niacin (NIASPAN) 500 MG CR tablet   Oral   Take 500 mg by mouth at bedtime.         . senna-docusate (SENOKOT-S) 8.6-50  MG per tablet   Oral   Take 1 tablet by mouth 2 (two) times daily. For constipation   60 tablet   3   . simvastatin (ZOCOR) 5 MG tablet   Oral   Take 5 mg by mouth at bedtime.         Marland Kitchen albuterol (PROVENTIL HFA;VENTOLIN HFA) 108 (90 BASE) MCG/ACT inhaler   Inhalation   Inhale 2 puffs into the lungs every 4 (four) hours as needed. For cough/shortness of breath         . guaiFENesin (ROBITUSSIN) 100 MG/5ML SOLN   Oral   Take 10 mLs by mouth every 4 (four) hours as needed. For cough.         . phosphate (FLEET) enema   Rectal   Place 1 enema rectally daily as needed. For constipation.         . polyethylene glycol powder (GLYCOLAX/MIRALAX) powder   Oral   Take 17 g by mouth daily. Take 17 g (one heaping tablespoon) in drink of your choice, 8 ounces three to four times a day until having loose stools, then decrease to once or twice a day to continue having soft stools.   255 g   0     BP 124/73  Pulse 92  Temp(Src) 98.9 F (37.2 C) (Oral)  Resp 16  SpO2 99%  Physical Exam  Nursing note and vitals reviewed. Constitutional: He is oriented  to person, place, and time. He appears well-developed and well-nourished.  HENT:  Head: Normocephalic and atraumatic.  Nose: Nose normal.  Mouth/Throat: Oropharynx is clear and moist.  Eyes: Conjunctivae and EOM are normal. Pupils are equal, round, and reactive to light.  Neck: Normal range of motion. Neck supple. No JVD present. No tracheal deviation present. No thyromegaly present.  Cardiovascular: Normal rate, regular rhythm, normal heart sounds and intact distal pulses.  Exam reveals no gallop and no friction rub.   No murmur heard. Pulmonary/Chest: Effort normal and breath sounds normal. No stridor. No respiratory distress. He has no wheezes. He has no rales. He exhibits no tenderness.  Abdominal: Soft. Bowel sounds are normal. He exhibits no distension and no mass. There is no tenderness. There is no rebound and no guarding.  Genitourinary:  Patient with scant amount of stool in the rectum at the tip of my finger.  Manual disimpaction done, but more stool higher up, unable to extract  Musculoskeletal: Normal range of motion. He exhibits no edema and no tenderness.  Lymphadenopathy:    He has no cervical adenopathy.  Neurological: He is alert and oriented to person, place, and time. He exhibits normal muscle tone. Coordination normal.  Skin: Skin is warm and dry. No rash noted. No erythema. No pallor.  Psychiatric: He has a normal mood and affect. His behavior is normal. Judgment and thought content normal.    ED Course  Procedures (including critical care time)  Labs Reviewed - No data to display Dg Abd Acute W/chest  05/03/2012  *RADIOLOGY REPORT*  Clinical Data: Abdominal pain, constipation  ACUTE ABDOMEN SERIES (ABDOMEN 2 VIEW & CHEST 1 VIEW)  Comparison: 04/03/2012  Findings: Aortic atherosclerosis.  Heart size upper normal.  Mild lung base opacities, favor atelectasis and/or scarring.  Large stool burden.  Bowel gas pattern nonobstructive.  No acute osseous finding.  IMPRESSION:  Large stool burden.  Bowel gas pattern nonobstructive.   Original Report Authenticated By: Jearld Lesch, M.D.      1. Constipation  MDM  57 year old male with constipation.  Patient with significant stool burden on his x-ray.  He has had good result with a soapsuds enema.  Will start patient on 3 times a day MiraLAX.  He was also given magnesium citrate here in the emergency department.  Patient to followup with primary care Dr. For further management of his ongoing constipation       Olivia Mackie, MD 05/03/12 [phone]

## 2012-07-31 ENCOUNTER — Emergency Department (HOSPITAL_COMMUNITY)
Admission: EM | Admit: 2012-07-31 | Discharge: 2012-08-01 | Disposition: A | Discharge status: Home / Self Care | Payer: Medicaid Other | Attending: Emergency Medicine | Admitting: Emergency Medicine

## 2012-07-31 ENCOUNTER — Encounter (HOSPITAL_COMMUNITY): Payer: Self-pay | Admitting: Adult Health

## 2012-07-31 DIAGNOSIS — F259 Schizoaffective disorder, unspecified: Secondary | ICD-10-CM | POA: Insufficient documentation

## 2012-07-31 DIAGNOSIS — Z8719 Personal history of other diseases of the digestive system: Secondary | ICD-10-CM | POA: Insufficient documentation

## 2012-07-31 DIAGNOSIS — I10 Essential (primary) hypertension: Secondary | ICD-10-CM | POA: Insufficient documentation

## 2012-07-31 DIAGNOSIS — F17200 Nicotine dependence, unspecified, uncomplicated: Secondary | ICD-10-CM | POA: Insufficient documentation

## 2012-07-31 DIAGNOSIS — M79609 Pain in unspecified limb: Secondary | ICD-10-CM | POA: Insufficient documentation

## 2012-07-31 DIAGNOSIS — J449 Chronic obstructive pulmonary disease, unspecified: Secondary | ICD-10-CM | POA: Insufficient documentation

## 2012-07-31 DIAGNOSIS — E119 Type 2 diabetes mellitus without complications: Secondary | ICD-10-CM | POA: Insufficient documentation

## 2012-07-31 DIAGNOSIS — Z7982 Long term (current) use of aspirin: Secondary | ICD-10-CM | POA: Insufficient documentation

## 2012-07-31 DIAGNOSIS — M79604 Pain in right leg: Secondary | ICD-10-CM

## 2012-07-31 DIAGNOSIS — J4489 Other specified chronic obstructive pulmonary disease: Secondary | ICD-10-CM | POA: Insufficient documentation

## 2012-07-31 DIAGNOSIS — Z79899 Other long term (current) drug therapy: Secondary | ICD-10-CM | POA: Insufficient documentation

## 2012-07-31 LAB — GLUCOSE, CAPILLARY: Glucose-Capillary: 107 mg/dL — ABNORMAL HIGH (ref 70–99)

## 2012-07-31 LAB — POCT I-STAT, CHEM 8
Calcium, Ion: 1.22 mmol/L (ref 1.12–1.23)
Glucose, Bld: 106 mg/dL — ABNORMAL HIGH (ref 70–99)
HCT: 40 % (ref 39.0–52.0)
Hemoglobin: 13.6 g/dL (ref 13.0–17.0)
Potassium: 3.7 mEq/L (ref 3.5–5.1)

## 2012-07-31 MED ORDER — ACETAMINOPHEN 325 MG PO TABS
650.0000 mg | ORAL_TABLET | Freq: Once | ORAL | Status: AC
  Administered 2012-07-31: 650 mg via ORAL
  Filled 2012-07-31: qty 2

## 2012-07-31 NOTE — ED Notes (Signed)
Pt from [facility] assisted living complaining of bilateral leg pain. He states, "I don't know, they both just started hurting when I was church. I was just sitting in church and now it hurts to get up and it hurts to sit down. Its just a soreness" no edema noted, cms intact. Pt unable to rate pain. Denies pain with ambulation only complains of pain when standing up and sitting down. Pain is located primarily in bilateral thighs. Pt has flat affect.
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
## 2012-07-31 NOTE — ED Notes (Signed)
CBG 107 reported to Bartholomew Crews, RN

## 2012-08-01 NOTE — ED Provider Notes (Signed)
Medical screening examination/treatment/procedure(s) were performed by non-physician practitioner and as supervising physician I was immediately available for consultation/collaboration.   Delainy Mcelhiney, MD 08/01/12 0217 

## 2012-09-24 ENCOUNTER — Emergency Department (HOSPITAL_COMMUNITY): Payer: Medicaid Other

## 2012-09-24 ENCOUNTER — Emergency Department (HOSPITAL_COMMUNITY)
Admission: EM | Admit: 2012-09-24 | Discharge: 2012-09-25 | Disposition: A | Discharge status: Home / Self Care | Payer: Medicaid Other | Attending: Emergency Medicine | Admitting: Emergency Medicine

## 2012-09-24 ENCOUNTER — Encounter (HOSPITAL_COMMUNITY): Payer: Self-pay | Admitting: Emergency Medicine

## 2012-09-24 DIAGNOSIS — I10 Essential (primary) hypertension: Secondary | ICD-10-CM | POA: Insufficient documentation

## 2012-09-24 DIAGNOSIS — F17200 Nicotine dependence, unspecified, uncomplicated: Secondary | ICD-10-CM | POA: Insufficient documentation

## 2012-09-24 DIAGNOSIS — Z8719 Personal history of other diseases of the digestive system: Secondary | ICD-10-CM | POA: Insufficient documentation

## 2012-09-24 DIAGNOSIS — J449 Chronic obstructive pulmonary disease, unspecified: Secondary | ICD-10-CM | POA: Insufficient documentation

## 2012-09-24 DIAGNOSIS — S3981XA Other specified injuries of abdomen, initial encounter: Secondary | ICD-10-CM | POA: Insufficient documentation

## 2012-09-24 DIAGNOSIS — F259 Schizoaffective disorder, unspecified: Secondary | ICD-10-CM | POA: Insufficient documentation

## 2012-09-24 DIAGNOSIS — R109 Unspecified abdominal pain: Secondary | ICD-10-CM

## 2012-09-24 DIAGNOSIS — S199XXA Unspecified injury of neck, initial encounter: Secondary | ICD-10-CM | POA: Insufficient documentation

## 2012-09-24 DIAGNOSIS — Z79899 Other long term (current) drug therapy: Secondary | ICD-10-CM | POA: Insufficient documentation

## 2012-09-24 DIAGNOSIS — S0993XA Unspecified injury of face, initial encounter: Secondary | ICD-10-CM | POA: Insufficient documentation

## 2012-09-24 DIAGNOSIS — E119 Type 2 diabetes mellitus without complications: Secondary | ICD-10-CM | POA: Insufficient documentation

## 2012-09-24 DIAGNOSIS — J4489 Other specified chronic obstructive pulmonary disease: Secondary | ICD-10-CM | POA: Insufficient documentation

## 2012-09-24 LAB — POCT I-STAT, CHEM 8
Calcium, Ion: 1.16 mmol/L (ref 1.12–1.23)
Chloride: 102 mEq/L (ref 96–112)
HCT: 43 % (ref 39.0–52.0)
TCO2: 28 mmol/L (ref 0–100)
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
## 2012-09-24 NOTE — ED Notes (Signed)
Bed:WA03<BR> Expected date:<BR> Expected time:<BR> Means of arrival:<BR> Comments:<BR> EMS
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
## 2012-09-25 ENCOUNTER — Encounter (HOSPITAL_COMMUNITY): Payer: Self-pay

## 2012-09-25 MED ORDER — IOHEXOL 300 MG/ML  SOLN
100.0000 mL | Freq: Once | INTRAMUSCULAR | Status: AC | PRN
  Administered 2012-09-25: 100 mL via INTRAVENOUS

## 2012-09-25 NOTE — ED Provider Notes (Signed)
Discussed ct findings with patient including the l4 finding.  Pt does not have discrete ttp 4th lumbar vertebra, doubt that this is an acute finding.  Celene Kras, MD 09/25/12 [phone]

## 2012-11-01 ENCOUNTER — Emergency Department (HOSPITAL_COMMUNITY)
Admission: EM | Admit: 2012-11-01 | Discharge: 2012-11-01 | Disposition: A | Discharge status: Home / Self Care | Payer: Medicaid Other | Attending: Emergency Medicine | Admitting: Emergency Medicine

## 2012-11-01 ENCOUNTER — Encounter (HOSPITAL_COMMUNITY): Payer: Self-pay | Admitting: Emergency Medicine

## 2012-11-01 DIAGNOSIS — E119 Type 2 diabetes mellitus without complications: Secondary | ICD-10-CM | POA: Insufficient documentation

## 2012-11-01 DIAGNOSIS — Z202 Contact with and (suspected) exposure to infections with a predominantly sexual mode of transmission: Secondary | ICD-10-CM | POA: Insufficient documentation

## 2012-11-01 DIAGNOSIS — J4489 Other specified chronic obstructive pulmonary disease: Secondary | ICD-10-CM | POA: Insufficient documentation

## 2012-11-01 DIAGNOSIS — I10 Essential (primary) hypertension: Secondary | ICD-10-CM | POA: Insufficient documentation

## 2012-11-01 DIAGNOSIS — F259 Schizoaffective disorder, unspecified: Secondary | ICD-10-CM | POA: Insufficient documentation

## 2012-11-01 DIAGNOSIS — J449 Chronic obstructive pulmonary disease, unspecified: Secondary | ICD-10-CM | POA: Insufficient documentation

## 2012-11-01 DIAGNOSIS — F17200 Nicotine dependence, unspecified, uncomplicated: Secondary | ICD-10-CM | POA: Insufficient documentation

## 2012-11-01 DIAGNOSIS — Z79899 Other long term (current) drug therapy: Secondary | ICD-10-CM | POA: Insufficient documentation

## 2012-11-01 LAB — URINALYSIS, ROUTINE W REFLEX MICROSCOPIC
Ketones, ur: NEGATIVE mg/dL
Nitrite: NEGATIVE
Protein, ur: NEGATIVE mg/dL
Urobilinogen, UA: 1 mg/dL (ref 0.0–1.0)

## 2012-11-01 MED ORDER — CEFTRIAXONE SODIUM 250 MG IJ SOLR
250.0000 mg | Freq: Once | INTRAMUSCULAR | Status: AC
  Administered 2012-11-01: 250 mg via INTRAMUSCULAR
  Filled 2012-11-01: qty 250

## 2012-11-01 MED ORDER — AZITHROMYCIN 250 MG PO TABS
1000.0000 mg | ORAL_TABLET | Freq: Once | ORAL | Status: AC
  Administered 2012-11-01: 1000 mg via ORAL
  Filled 2012-11-01: qty 4
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
## 2013-01-24 ENCOUNTER — Emergency Department (HOSPITAL_COMMUNITY)
Admission: EM | Admit: 2013-01-24 | Discharge: 2013-01-24 | Disposition: A | Discharge status: Skilled Nursing Facility | Payer: Medicaid Other | Attending: Emergency Medicine | Admitting: Emergency Medicine

## 2013-01-24 ENCOUNTER — Encounter (HOSPITAL_COMMUNITY): Payer: Self-pay | Admitting: Emergency Medicine

## 2013-01-24 ENCOUNTER — Emergency Department (HOSPITAL_COMMUNITY): Payer: Medicaid Other

## 2013-01-24 DIAGNOSIS — F17200 Nicotine dependence, unspecified, uncomplicated: Secondary | ICD-10-CM | POA: Insufficient documentation

## 2013-01-24 DIAGNOSIS — K5900 Constipation, unspecified: Secondary | ICD-10-CM | POA: Insufficient documentation

## 2013-01-24 DIAGNOSIS — J441 Chronic obstructive pulmonary disease with (acute) exacerbation: Secondary | ICD-10-CM | POA: Insufficient documentation

## 2013-01-24 DIAGNOSIS — F259 Schizoaffective disorder, unspecified: Secondary | ICD-10-CM | POA: Insufficient documentation

## 2013-01-24 DIAGNOSIS — I10 Essential (primary) hypertension: Secondary | ICD-10-CM | POA: Insufficient documentation

## 2013-01-24 DIAGNOSIS — E119 Type 2 diabetes mellitus without complications: Secondary | ICD-10-CM | POA: Insufficient documentation

## 2013-01-24 DIAGNOSIS — Z79899 Other long term (current) drug therapy: Secondary | ICD-10-CM | POA: Insufficient documentation

## 2013-01-24 LAB — COMPREHENSIVE METABOLIC PANEL
AST: 14 U/L (ref 0–37)
BUN: 12 mg/dL (ref 6–23)
CO2: 29 mEq/L (ref 19–32)
Chloride: 101 mEq/L (ref 96–112)
Creatinine, Ser: 0.69 mg/dL (ref 0.50–1.35)
GFR calc non Af Amer: >90 mL/min (ref >90)
Total Bilirubin: 0.3 mg/dL (ref 0.3–1.2)

## 2013-01-24 LAB — CBC WITH DIFFERENTIAL/PLATELET
Eosinophils Absolute: 0 10*3/uL (ref 0.0–0.7)
Lymphs Abs: 1.9 10*3/uL (ref 0.7–4.0)
MCH: 28.4 pg (ref 26.0–34.0)
Neutrophils Relative %: 74 % (ref 43–77)
Platelets: 244 10*3/uL (ref 150–400)
RBC: 4.16 MIL/uL — ABNORMAL LOW (ref 4.22–5.81)
WBC: 10.2 10*3/uL (ref 4.0–10.5)

## 2013-01-24 MED ORDER — FLEET ENEMA 7-19 GM/118ML RE ENEM: 1.0000 | ENEMA | Freq: Once | RECTAL | Status: DC

## 2013-01-24 MED ORDER — IPRATROPIUM BROMIDE 0.02 % IN SOLN
0.5000 mg | Freq: Once | RESPIRATORY_TRACT | Status: AC
  Administered 2013-01-24: 0.5 mg via RESPIRATORY_TRACT
  Filled 2013-01-24: qty 2.5

## 2013-01-24 MED ORDER — POLYETHYLENE GLYCOL 3350 17 G PO PACK
17.0000 g | PACK | Freq: Every day | ORAL | Status: DC
  Administered 2013-01-24: 17 g via ORAL
  Filled 2013-01-24: qty 1

## 2013-01-24 MED ORDER — PREDNISONE 20 MG PO TABS: ORAL_TABLET | ORAL | Status: DC

## 2013-01-24 MED ORDER — POLYETHYLENE GLYCOL 3350 17 G PO PACK: 17.0000 g | PACK | Freq: Every day | ORAL | Status: DC

## 2013-01-24 MED ORDER — PREDNISONE 20 MG PO TABS
60.0000 mg | ORAL_TABLET | Freq: Once | ORAL | Status: AC
  Administered 2013-01-24: 60 mg via ORAL
  Filled 2013-01-24: qty 3

## 2013-01-24 MED ORDER — ALBUTEROL SULFATE (5 MG/ML) 0.5% IN NEBU
5.0000 mg | INHALATION_SOLUTION | Freq: Once | RESPIRATORY_TRACT | Status: AC
  Administered 2013-01-24: 5 mg via RESPIRATORY_TRACT
  Filled 2013-01-24: qty 1

## 2013-01-24 NOTE — ED Notes (Signed)
Patient states he started having constipation issues about 3 wks ago.  Patient states he gets "an enema, supposed to be everyday, but i get it most days".   Patient states he has had a worsening cough x 3 wks also.

## 2013-01-24 NOTE — ED Provider Notes (Signed)
CSN: [id number]     Arrival date & time 01/24/13  0444 History   First MD Initiated Contact with Patient 01/24/13 0446     Chief Complaint  Patient presents with  . Abdominal Pain  . Cough   (Consider location/radiation/quality/duration/timing/severity/associated sxs/prior Treatment) HPI Comments: 57 yo male with htn, copd, dm, partial small bowel presents with left sided abdo pain/ cramping for 1.5 wks and non productive cough for 2 wks.  No current abx.  Abd pain intermittent.  No diverticular hx.  Decreased stools the past two weeks.  Sxs mild.   Patient is a 57 y.o. male presenting with abdominal pain and cough. The history is provided by the patient.  Abdominal Pain Associated symptoms: constipation and cough   Associated symptoms: no chest pain, no chills, no dysuria, no fever, no nausea, no shortness of breath and no vomiting   Cough Associated symptoms: no chest pain, no chills, no fever, no headaches, no rash and no shortness of breath     Past Medical History  Diagnosis Date  . Hypertension   . Diabetes mellitus   . Schizophrenia, schizo-affective   . COPD (chronic obstructive pulmonary disease)   . Impacted fece   . Constipation    History reviewed. No pertinent past surgical history. No family history on file. History  Substance Use Topics  . Smoking status: Current Every Day Smoker -- 1.50 packs/day    Types: Cigarettes  . Smokeless tobacco: Not on file  . Alcohol Use: No    Review of Systems  Constitutional: Negative for fever and chills.  HENT: Negative for congestion.   Eyes: Negative for visual disturbance.  Respiratory: Positive for cough. Negative for shortness of breath.   Cardiovascular: Negative for chest pain.  Gastrointestinal: Positive for abdominal pain and constipation. Negative for nausea and vomiting.  Genitourinary: Negative for dysuria and flank pain.  Musculoskeletal: Negative for back pain, neck pain and neck stiffness.  Skin: Negative  for rash.  Neurological: Negative for light-headedness and headaches.    Allergies  Review of patient's allergies indicates no known allergies.  Home Medications   Current Outpatient Rx  Name  Route  Sig  Dispense  Refill  . acetaminophen (TYLENOL) 325 MG tablet   Oral   Take 650 mg by mouth every 6 (six) hours as needed for pain or fever (headache).          Marland Kitchen albuterol (PROVENTIL HFA;VENTOLIN HFA) 108 (90 BASE) MCG/ACT inhaler   Inhalation   Inhale 2 puffs into the lungs every 4 (four) hours as needed for wheezing or shortness of breath.          . benztropine (COGENTIN) 1 MG tablet   Oral   Take 1 mg by mouth 2 (two) times daily.         . cloZAPine (CLOZARIL) 100 MG tablet   Oral   Take 100-400 mg by mouth 3 (three) times daily. Takes 1 tablet every morning and afternoon Takes 4 tablets at bedtime         . guaiFENesin (ROBITUSSIN) 100 MG/5ML SOLN   Oral   Take 10 mLs by mouth every 4 (four) hours as needed (for cough).          . haloperidol (HALDOL) 5 MG tablet   Oral   Take 5-10 mg by mouth 2 (two) times daily. Patient takes 1 tablet by mouth every morning and 2 tablets by mouth at bedtime         .  hydrochlorothiazide (HYDRODIURIL) 25 MG tablet   Oral   Take 25 mg by mouth daily.         Marland Kitchen lactulose (CHRONULAC) 10 GM/15ML solution   Oral   Take 30 g by mouth 2 (two) times daily.         Marland Kitchen lisinopril (PRINIVIL,ZESTRIL) 20 MG tablet   Oral   Take 20 mg by mouth daily.          . metFORMIN (GLUCOPHAGE) 500 MG tablet   Oral   Take 500 mg by mouth 2 (two) times daily with a meal.         . metoprolol tartrate (LOPRESSOR) 25 MG tablet   Oral   Take 25 mg by mouth 2 (two) times daily.         . Multiple Vitamin (MULITIVITAMIN WITH MINERALS) TABS   Oral   Take 1 tablet by mouth daily.         Marland Kitchen senna (SENOKOT) 8.6 MG TABS   Oral   Take 1 tablet by mouth 2 (two) times daily.         . simvastatin (ZOCOR) 5 MG tablet   Oral    Take 5 mg by mouth at bedtime.          BP 121/66  Pulse 84  Temp(Src) 98.2 F (36.8 C) (Oral)  Resp 18  SpO2 94% Physical Exam  Nursing note and vitals reviewed. Constitutional: He is oriented to person, place, and time. He appears well-developed and well-nourished.  HENT:  Head: Normocephalic and atraumatic.  Eyes: Conjunctivae are normal. Right eye exhibits no discharge. Left eye exhibits no discharge.  Neck: Normal range of motion. Neck supple. No tracheal deviation present.  Cardiovascular: Normal rate and regular rhythm.   Pulmonary/Chest: Effort normal. He has wheezes (exp bilateral).  Abdominal: Soft. He exhibits distension (mild). There is tenderness (mild left sided). There is no guarding.  Musculoskeletal: He exhibits no edema.  Neurological: He is alert and oriented to person, place, and time.  Skin: Skin is warm. No rash noted.  Psychiatric: He has a normal mood and affect.    ED Course  Procedures (including critical care time) Labs Review Labs Reviewed  COMPREHENSIVE METABOLIC PANEL - Abnormal; Notable for the following:    Glucose, Bld 117 (*)    Albumin 3.3 (*)    All other components within normal limits  CBC WITH DIFFERENTIAL - Abnormal; Notable for the following:    RBC 4.16 (*)    Hemoglobin 11.8 (*)    HCT 37.1 (*)    All other components within normal limits  LIPASE, BLOOD   Imaging Review Dg Abd Acute W/chest  01/24/2013   CLINICAL DATA:  The cough, abdominal pain and distention  EXAM: ACUTE ABDOMEN SERIES (ABDOMEN 2 VIEW & CHEST 1 VIEW)  COMPARISON:  Prior CT from 09/25/2012  FINDINGS: The cardiac and mediastinal silhouettes are stable in size and contour, and remain within normal limits.  Lungs are normally inflated. No focal infiltrate, pulmonary edema, pleural effusion, or pneumothorax identified.  No dilated loops of bowel are seen within the abdomen to suggest obstruction or ileus. The a large amount of retained fecal material is present  throughout the colon. No abnormal bowel wall thickening identified. No free air. No soft tissue mass or abnormal calcifications.  Degenerative changes are noted within the lower lumbar spine. No acute osseous abnormality. Scattered vascular calcifications overlying the pelvic inlet.  IMPRESSION: 1. Nonobstructive bowel gas pattern. 2. Large amount  of retained stool throughout the colon, suggestive of constipation. 3. No acute cardiopulmonary process.   Electronically Signed   By: Rise Mu M.D.   On: 01/24/2013 05:31    EKG Interpretation   None       MDM   1. Constipation   2. COPD exacerbation    Concern for copd/ bronchitis. Abdo exam benign - likely constipation - labs and xrays with obstruction hx.  Well appearing. Pt improved with duonebs, steroids given.  Pt improved in ED.  Constipation on xray, similar to previous issues for pt.   Results and differential diagnosis were discussed with the patient. Close follow up outpatient was discussed, patient comfortable with the plan.       Enid Skeens, MD 01/24/13 [phone]

## 2013-01-24 NOTE — ED Notes (Signed)
[facility] at bedside 

## 2013-02-02 ENCOUNTER — Telehealth: Payer: Self-pay | Admitting: Surgery

## 2013-02-02 NOTE — Telephone Encounter (Signed)
Message copied by Michel Bickers on Fri Feb 02, 2013  7:32 PM ------      Message from: Blane Ohara      Created: Wed Jan 24, 2013  7:18 AM      Regarding: Order for Pelley,Hughie             Patient Name: Craig Bentley,Craig Bentley(41)      Sex: Male      DOB: Feb 19, 1956              PCP: PCP NOT IN SYSTEM        Center: [facility]             Types of orders made on 01/24/2013: Consult, Imaging, Lab, Medications, Nursing            Order Date:01/24/2013      Ordering Stark Jock [[id number]]      Attending Provider:Joshua Scarlett Presto, MD [phone]      Authorizing Provider: Enid Skeens, MD [phone]      Department:[facility][[id number]]            Order Specific Information      Order: COPD clinic follow up [Custom: CON4000]  Order #: [id number]  Qty: 1        Priority: Routine  Class: Hospital Performed          Where does the patient receive follow up COPD care -> Other                 Follow up in -> 5-7 days               Released on: 01/24/2013  7:18 AM                    Priority: Routine  Class: Hospital Performed          Where does the patient receive follow up COPD care -> Other                 Follow up in -> 5-7 days               Released on: 01/24/2013  7:18 AM       ------

## 2013-02-27 ENCOUNTER — Emergency Department (HOSPITAL_COMMUNITY): Payer: Medicaid Other

## 2013-02-27 ENCOUNTER — Emergency Department (HOSPITAL_COMMUNITY)
Admission: EM | Admit: 2013-02-27 | Discharge: 2013-02-27 | Disposition: A | Discharge status: Home / Self Care | Payer: Medicaid Other | Attending: Emergency Medicine | Admitting: Emergency Medicine

## 2013-02-27 ENCOUNTER — Encounter (HOSPITAL_COMMUNITY): Payer: Self-pay | Admitting: Emergency Medicine

## 2013-02-27 DIAGNOSIS — Z8659 Personal history of other mental and behavioral disorders: Secondary | ICD-10-CM | POA: Insufficient documentation

## 2013-02-27 DIAGNOSIS — J449 Chronic obstructive pulmonary disease, unspecified: Secondary | ICD-10-CM | POA: Insufficient documentation

## 2013-02-27 DIAGNOSIS — I10 Essential (primary) hypertension: Secondary | ICD-10-CM | POA: Insufficient documentation

## 2013-02-27 DIAGNOSIS — E119 Type 2 diabetes mellitus without complications: Secondary | ICD-10-CM | POA: Insufficient documentation

## 2013-02-27 DIAGNOSIS — F17200 Nicotine dependence, unspecified, uncomplicated: Secondary | ICD-10-CM | POA: Insufficient documentation

## 2013-02-27 DIAGNOSIS — K5900 Constipation, unspecified: Secondary | ICD-10-CM

## 2013-02-27 DIAGNOSIS — Z79899 Other long term (current) drug therapy: Secondary | ICD-10-CM | POA: Insufficient documentation

## 2013-02-27 DIAGNOSIS — J4489 Other specified chronic obstructive pulmonary disease: Secondary | ICD-10-CM | POA: Insufficient documentation

## 2013-02-27 LAB — POCT I-STAT, CHEM 8
BUN: 14 mg/dL (ref 6–23)
CREATININE: 0.8 mg/dL (ref 0.50–1.35)
Calcium, Ion: 1.19 mmol/L (ref 1.12–1.23)
Chloride: 102 mEq/L (ref 96–112)
Glucose, Bld: 85 mg/dL (ref 70–99)
HCT: 40 % (ref 39.0–52.0)
HEMOGLOBIN: 13.6 g/dL (ref 13.0–17.0)
POTASSIUM: 3.7 meq/L (ref 3.7–5.3)
SODIUM: 142 meq/L (ref 137–147)
TCO2: 28 mmol/L (ref 0–100)

## 2013-02-27 LAB — CBC WITH DIFFERENTIAL/PLATELET
Basophils Absolute: 0 10*3/uL (ref 0.0–0.1)
Basophils Relative: 0 % (ref 0–1)
Eosinophils Absolute: 0 10*3/uL (ref 0.0–0.7)
Eosinophils Relative: 0 % (ref 0–5)
HCT: 39 % (ref 39.0–52.0)
Hemoglobin: 12.2 g/dL — ABNORMAL LOW (ref 13.0–17.0)
LYMPHS ABS: 2.5 10*3/uL (ref 0.7–4.0)
LYMPHS PCT: 28 % (ref 12–46)
MCH: 27.7 pg (ref 26.0–34.0)
MCHC: 31.3 g/dL (ref 30.0–36.0)
MCV: 88.4 fL (ref 78.0–100.0)
Monocytes Absolute: 0.6 10*3/uL (ref 0.1–1.0)
Monocytes Relative: 7 % (ref 3–12)
NEUTROS ABS: 5.6 10*3/uL (ref 1.7–7.7)
NEUTROS PCT: 65 % (ref 43–77)
PLATELETS: 206 10*3/uL (ref 150–400)
RBC: 4.41 MIL/uL (ref 4.22–5.81)
RDW: 13.2 % (ref 11.5–15.5)
WBC: 8.7 10*3/uL (ref 4.0–10.5)

## 2013-02-27 MED ORDER — MAGNESIUM CITRATE PO SOLN: 1.0000 | Freq: Once | ORAL | Status: DC

## 2013-02-27 MED ORDER — MAGNESIUM CITRATE PO SOLN
1.0000 | Freq: Once | ORAL | Status: DC
  Filled 2013-02-27: qty 296

## 2013-02-27 MED ORDER — POLYETHYLENE GLYCOL 3350 17 GM/SCOOP PO POWD
1.0000 | Freq: Once | ORAL | Status: AC
  Administered 2013-02-27: 1 via ORAL
  Filled 2013-02-27: qty 255

## 2013-02-27 NOTE — ED Notes (Signed)
Bed: SE83 Expected date:  Expected time:  Means of arrival:  Comments: EMS

## 2013-02-27 NOTE — Discharge Instructions (Signed)
Please take your Magnesium Citrate solution tonight if you have not produce a bowel movement.  If after taking the bottle and you still unable to produce a bowel movement by tomorrow afternoon, then please call Dr. Doyle Askew at [phone] so she can see you closely.     Constipation, Adult Constipation is when a person has fewer than 3 bowel movements a week; has difficulty having a bowel movement; or has stools that are dry, hard, or larger than normal. As people grow older, constipation is more common. If you try to fix constipation with medicines that make you have a bowel movement (laxatives), the problem may get worse. Long-term laxative use may cause the muscles of the colon to become weak. A low-fiber diet, not taking in enough fluids, and taking certain medicines may make constipation worse. CAUSES   Certain medicines, such as antidepressants, pain medicine, iron supplements, antacids, and water pills.   Certain diseases, such as diabetes, irritable bowel syndrome (IBS), thyroid disease, or depression.   Not drinking enough water.   Not eating enough fiber-rich foods.   Stress or travel.  Lack of physical activity or exercise.  Not going to the restroom when there is the urge to have a bowel movement.  Ignoring the urge to have a bowel movement.  Using laxatives too much. SYMPTOMS   Having fewer than 3 bowel movements a week.   Straining to have a bowel movement.   Having hard, dry, or larger than normal stools.   Feeling full or bloated.   Pain in the lower abdomen.  Not feeling relief after having a bowel movement. DIAGNOSIS  Your caregiver will take a medical history and perform a physical exam. Further testing may be done for severe constipation. Some tests may include:   A barium enema X-ray to examine your rectum, colon, and sometimes, your small intestine.  A sigmoidoscopy to examine your lower colon.  A colonoscopy to examine your entire  colon. TREATMENT  Treatment will depend on the severity of your constipation and what is causing it. Some dietary treatments include drinking more fluids and eating more fiber-rich foods. Lifestyle treatments may include regular exercise. If these diet and lifestyle recommendations do not help, your caregiver may recommend taking over-the-counter laxative medicines to help you have bowel movements. Prescription medicines may be prescribed if over-the-counter medicines do not work.  HOME CARE INSTRUCTIONS   Increase dietary fiber in your diet, such as fruits, vegetables, whole grains, and beans. Limit high-fat and processed sugars in your diet, such as [place] fries, hamburgers, cookies, candies, and soda.   A fiber supplement may be added to your diet if you cannot get enough fiber from foods.   Drink enough fluids to keep your urine clear or pale yellow.   Exercise regularly or as directed by your caregiver.   Go to the restroom when you have the urge to go. Do not hold it.  Only take medicines as directed by your caregiver. Do not take other medicines for constipation without talking to your caregiver first. [place] IF:   You have bright red blood in your stool.   Your constipation lasts for more than 4 days or gets worse.   You have abdominal or rectal pain.   You have thin, pencil-like stools.  You have unexplained weight loss. MAKE SURE YOU:   Understand these instructions.  Will watch your condition.  Will get help right away if you are not doing well or get worse. Document  Released: 11/07/2003 Document Revised: 05/03/2011 Document Reviewed: 01/12/2011 [facility] Patient Information 2014 [place], [place].

## 2013-02-27 NOTE — ED Notes (Signed)
Spoke with Randa Evens, Supervisor at [facility]. They can administer the Mag Citrate with a prescription at the facility. ED PA Rona Ravens informed of this information.

## 2013-02-27 NOTE — ED Notes (Signed)
Per EMS pt coming from [place] home with c/o constipation; per staff pt didn't have bowel movement in 10 days.

## 2013-02-27 NOTE — ED Provider Notes (Signed)
Medical screening examination/treatment/procedure(s) were performed by non-physician practitioner and as supervising physician I was immediately available for consultation/collaboration.  EKG Interpretation   None         Osvaldo Shipper, MD 02/27/13 2322

## 2013-02-27 NOTE — ED Notes (Signed)
[facility] called for transport.  

## 2013-02-27 NOTE — ED Provider Notes (Signed)
CSN: [id number]     Arrival date & time 02/27/13  1647 History   First MD Initiated Contact with Patient 02/27/13 1651     Chief Complaint  Patient presents with  . Constipation   (Consider location/radiation/quality/duration/timing/severity/associated sxs/prior Treatment) HPI  58 year old male with history partial small bowel obstruction, constipation, schizophrenia, COPD, and diabetes presents for evaluations of constipation. Patient arrived from [place] home via EMS. The patient states he usually has a bowel movement every for 5 days. For the past 10 days he has not had a bowel movement. States his stomach feels "bubbly". Report mild generalized abdominal discomfort, constant in nature for an unspecified duration. He states he has constipation at this past, and was given lactulose which he has been taking without relief. He is able to pass flatus.  Report some nausea without vomiting. Denies fever, chills, productive cough, dysuria, rectal bleeding.  Denies any prior abdominal surgery. Denies any recent change in medication specifically no narcotic pain medications. Denies change in appetite or food. Report takingTylenol occasionally for his frontal headache.  Pt is a smoker, denies hx of alcohol or rec drug use.    Past Medical History  Diagnosis Date  . Hypertension   . Diabetes mellitus   . Schizophrenia, schizo-affective   . COPD (chronic obstructive pulmonary disease)   . Impacted fece   . Constipation    History reviewed. No pertinent past surgical history. History reviewed. No pertinent family history. History  Substance Use Topics  . Smoking status: Current Every Day Smoker -- 1.50 packs/day    Types: Cigarettes  . Smokeless tobacco: Not on file  . Alcohol Use: No    Review of Systems  Constitutional: Negative for fever.  Respiratory: Negative for chest tightness and shortness of breath.   Cardiovascular: Negative for chest pain.  Gastrointestinal: Positive for  abdominal pain and abdominal distention.  Skin: Negative for rash and wound.  All other systems reviewed and are negative.    Allergies  Review of patient's allergies indicates no known allergies.  Home Medications   Current Outpatient Rx  Name  Route  Sig  Dispense  Refill  . acetaminophen (TYLENOL) 325 MG tablet   Oral   Take 650 mg by mouth every 6 (six) hours as needed for mild pain, fever or headache.          . albuterol (PROVENTIL HFA;VENTOLIN HFA) 108 (90 BASE) MCG/ACT inhaler   Inhalation   Inhale 2 puffs into the lungs every 4 (four) hours as needed for wheezing or shortness of breath.          . benztropine (COGENTIN) 1 MG tablet   Oral   Take 1 mg by mouth at bedtime.          . cloZAPine (CLOZARIL) 100 MG tablet   Oral   Take 100-400 mg by mouth 3 (three) times daily. Takes 1 tablet every morning and afternoon Takes 4 tablets at bedtime         . guaifenesin (ROBITUSSIN) 100 MG/5ML syrup   Oral   Take 200 mg by mouth 4 (four) times daily as needed for cough.         . haloperidol (HALDOL) 10 MG tablet   Oral   Take 10 mg by mouth at bedtime.         . hydrochlorothiazide (HYDRODIURIL) 25 MG tablet   Oral   Take 25 mg by mouth daily.         Marland Kitchen lactulose (  CHRONULAC) 10 GM/15ML solution   Oral   Take 30 g by mouth 2 (two) times daily.         Marland Kitchen lisinopril (PRINIVIL,ZESTRIL) 20 MG tablet   Oral   Take 20 mg by mouth daily.          . metFORMIN (GLUCOPHAGE) 500 MG tablet   Oral   Take 500 mg by mouth 2 (two) times daily with a meal.         . metoprolol tartrate (LOPRESSOR) 25 MG tablet   Oral   Take 25 mg by mouth 2 (two) times daily.         . Multiple Vitamin (MULITIVITAMIN WITH MINERALS) TABS   Oral   Take 1 tablet by mouth daily.         . polyethylene glycol (MIRALAX / GLYCOLAX) packet   Oral   Take 17 g by mouth daily.   14 each   0   . senna (SENOKOT) 8.6 MG TABS   Oral   Take 1 tablet by mouth 2 (two) times  daily.         . simvastatin (ZOCOR) 5 MG tablet   Oral   Take 5 mg by mouth at bedtime.         . sodium phosphate (FLEET) 7-19 GM/118ML ENEM   Rectal   Place 1 enema rectally daily as needed for moderate constipation or severe constipation. If no bowel movement for more than 24 hours          BP 123/69  Pulse 73  Temp(Src) 98.7 F (37.1 C) (Oral)  Resp 14  SpO2 98% Physical Exam  Constitutional: He appears well-developed and well-nourished. No distress.  HENT:  Head: Atraumatic.  Eyes: Conjunctivae are normal.  Neck: Normal range of motion. Neck supple.  Cardiovascular: Normal rate and regular rhythm.   Pulmonary/Chest: Effort normal and breath sounds normal.  Distant lung sounds without Wheezes, Rales, or Rhonchi  Abdominal: He exhibits distension. There is tenderness (Now generalized tenderness without guarding or rebound tenderness. No abdominal surgical scar noted.). There is no rebound and no guarding.  Bowel sounds decreased.    Genitourinary:  No inguinal hernia  Normal rectal tone, no rectal mass, no stool in rectal vault.    Neurological: He is alert.  Skin: No rash noted.  Psychiatric: He has a normal mood and affect.    ED Course  Procedures (including critical care time)  5:59 PM Pt here c/o constipation, hx of constipation in the past.  Currently has mild generalized abd tenderness without focal point tenderness.  No obvious signs of obstruction on digital rectal exam.  Will obtain acute abdominal series, basic labs and will continue to monitor.    6:54 PM Acute abdodminal series with suspect degrees of colonic ileus, with extensive stool throughout colon.  No obvious obstruction.  NO free air.  Since pt is currently on maximal therapy including lactulose, miralax, fleet enema, and senokot.  Will consult with [facility] for further recommendation.    7:41 PM I have consulted with [facility], Dr. Doyle Askew who recommend to give pt a  bottle of Mag Citrate to take home.  He can use it at home and if tomorrow he's unable to produce a BM he can contact Dr. Doyle Askew directly through her cell phone [phone]) and she will set him up for outpt f/u for further care or admitted if needed.  I discussed this with pt and he voice understanding.  Will give  GoLytely here.  Care discussed with Dr. Mingo Amber.   10:03 PM I have given pt GOLyteLy here in the ER.  He was unable to produce any BM, however pt will be discharged to his rest home with Mag Citrate.  Pt aware to f/u with Dr. Doyle Askew tomorrow if no improvement.    Labs Review Labs Reviewed  CBC WITH DIFFERENTIAL - Abnormal; Notable for the following:    Hemoglobin 12.2 (*)    All other components within normal limits  POCT I-STAT, CHEM 8   Imaging Review Dg Abd Acute W/chest  02/27/2013   CLINICAL DATA:  Abdominal distention and constipation  EXAM: ACUTE ABDOMEN SERIES (ABDOMEN 2 VIEW & CHEST 1 VIEW)  COMPARISON:  January 24, 2013  FINDINGS: PA chest: There is minimal scarring in the left base. Lungs are otherwise clear. Heart size and pulmonary vascularity are normal. No adenopathy.  Supine and upright abdomen: There is extensive stool throughout colon. Several loops of colon show mild dilatation. There is no appreciable small bowel dilatation. No air-fluid levels. No free air. There is vascular calcification in the pelvis.  IMPRESSION: Suspect a degree of colonic ileus. Extensive stool throughout colon. The appearance is not felt to be indicative of obstruction. No free air.  No lung edema or consolidation.   Electronically Signed   By: Lowella Grip M.D.   On: 02/27/2013 18:47    EKG Interpretation   None       MDM   1. Constipation    BP 123/69  Pulse 73  Temp(Src) 98.7 F (37.1 C) (Oral)  Resp 14  SpO2 98%  I have reviewed nursing notes and vital signs. I personally reviewed the imaging tests through PACS system  I reviewed available ER/hospitalization records  thought the EMR     Domenic Moras, [place] 02/27/13 2206

## 2013-02-28 LAB — OCCULT BLOOD, POC DEVICE: Fecal Occult Bld: NEGATIVE

## 2013-06-14 ENCOUNTER — Encounter (HOSPITAL_COMMUNITY): Payer: Self-pay | Admitting: Emergency Medicine

## 2013-06-14 ENCOUNTER — Emergency Department (HOSPITAL_COMMUNITY)
Admission: EM | Admit: 2013-06-14 | Discharge: 2013-06-15 | Disposition: A | Discharge status: Home / Self Care | Payer: Medicaid Other | Attending: Emergency Medicine | Admitting: Emergency Medicine

## 2013-06-14 ENCOUNTER — Emergency Department (HOSPITAL_COMMUNITY): Payer: Medicaid Other

## 2013-06-14 DIAGNOSIS — R110 Nausea: Secondary | ICD-10-CM | POA: Insufficient documentation

## 2013-06-14 DIAGNOSIS — K5909 Other constipation: Secondary | ICD-10-CM

## 2013-06-14 DIAGNOSIS — J449 Chronic obstructive pulmonary disease, unspecified: Secondary | ICD-10-CM | POA: Insufficient documentation

## 2013-06-14 DIAGNOSIS — K5900 Constipation, unspecified: Secondary | ICD-10-CM | POA: Insufficient documentation

## 2013-06-14 DIAGNOSIS — E119 Type 2 diabetes mellitus without complications: Secondary | ICD-10-CM | POA: Insufficient documentation

## 2013-06-14 DIAGNOSIS — F259 Schizoaffective disorder, unspecified: Secondary | ICD-10-CM | POA: Insufficient documentation

## 2013-06-14 DIAGNOSIS — Z79899 Other long term (current) drug therapy: Secondary | ICD-10-CM | POA: Insufficient documentation

## 2013-06-14 DIAGNOSIS — I10 Essential (primary) hypertension: Secondary | ICD-10-CM | POA: Insufficient documentation

## 2013-06-14 DIAGNOSIS — J4489 Other specified chronic obstructive pulmonary disease: Secondary | ICD-10-CM | POA: Insufficient documentation

## 2013-06-14 DIAGNOSIS — F17200 Nicotine dependence, unspecified, uncomplicated: Secondary | ICD-10-CM | POA: Insufficient documentation

## 2013-06-14 HISTORY — DX: Fecal impaction: K56.41

## 2013-06-14 LAB — CBC WITH DIFFERENTIAL/PLATELET
Basophils Absolute: 0 10*3/uL (ref 0.0–0.1)
Basophils Relative: 0 % (ref 0–1)
EOS PCT: 0 % (ref 0–5)
Eosinophils Absolute: 0 10*3/uL (ref 0.0–0.7)
HCT: 41.8 % (ref 39.0–52.0)
Hemoglobin: 13.2 g/dL (ref 13.0–17.0)
LYMPHS ABS: 2.9 10*3/uL (ref 0.7–4.0)
LYMPHS PCT: 27 % (ref 12–46)
MCH: 28 pg (ref 26.0–34.0)
MCHC: 31.6 g/dL (ref 30.0–36.0)
MCV: 88.6 fL (ref 78.0–100.0)
Monocytes Absolute: 0.7 10*3/uL (ref 0.1–1.0)
Monocytes Relative: 7 % (ref 3–12)
Neutro Abs: 7.1 10*3/uL (ref 1.7–7.7)
Neutrophils Relative %: 66 % (ref 43–77)
PLATELETS: 230 10*3/uL (ref 150–400)
RBC: 4.72 MIL/uL (ref 4.22–5.81)
RDW: 13.2 % (ref 11.5–15.5)
WBC: 10.6 10*3/uL — AB (ref 4.0–10.5)

## 2013-06-14 NOTE — ED Notes (Signed)
Pt reports generalized abdominal pain that began two hours ago. Pt reports nausea, however denies emesis or diarrhea. Pt is from [facility].

## 2013-06-14 NOTE — ED Provider Notes (Signed)
CSN: [id number]     Arrival date & time 06/14/13  2155 History   First MD Initiated Contact with Patient 06/14/13 2257     Chief Complaint  Patient presents with  . Abdominal Pain     (Consider location/radiation/quality/duration/timing/severity/associated sxs/prior Treatment) HPI This is a 58 year old male with a history of schizophrenia, COPD and constipation. He is here with abdominal discomfort that began about an hour and half ago. When asked how severe his pain is he replied "it doesn't hurt, it's just boiling". He is having some nausea with it but no vomiting. He states he is not constipated and moved his bowels 2 days ago. He states this does not feel like pain associated with previous episodes of constipation. There no specific mitigating or exacerbating factors.  Past Medical History  Diagnosis Date  . Hypertension   . Diabetes mellitus   . Schizophrenia, schizo-affective   . COPD (chronic obstructive pulmonary disease)   . Constipation   . Fecal impaction    History reviewed. No pertinent past surgical history. No family history on file. History  Substance Use Topics  . Smoking status: Current Every Day Smoker -- 1.50 packs/day    Types: Cigarettes  . Smokeless tobacco: Not on file  . Alcohol Use: No    Review of Systems  All other systems reviewed and are negative.     Allergies  Review of patient's allergies indicates no known allergies.  Home Medications   Prior to Admission medications   Medication Sig Start Date End Date Taking? Authorizing Provider  acetaminophen (TYLENOL) 325 MG tablet Take 650 mg by mouth every 6 (six) hours as needed for mild pain, fever or headache.    Yes Historical Provider, MD  albuterol (PROVENTIL HFA;VENTOLIN HFA) 108 (90 BASE) MCG/ACT inhaler Inhale 2 puffs into the lungs every 4 (four) hours as needed for wheezing or shortness of breath.    Yes Historical Provider, MD  benztropine (COGENTIN) 1 MG tablet Take 1 mg by mouth at  bedtime.    Yes Historical Provider, MD  cloZAPine (CLOZARIL) 100 MG tablet Take 100-400 mg by mouth 3 (three) times daily. Takes 1 tablet every morning and afternoon Takes 4 tablets at bedtime   Yes Historical Provider, MD  haloperidol (HALDOL) 10 MG tablet Take 10 mg by mouth at bedtime.   Yes Historical Provider, MD  hydrochlorothiazide (HYDRODIURIL) 25 MG tablet Take 25 mg by mouth daily.   Yes Historical Provider, MD  lactulose (CHRONULAC) 10 GM/15ML solution Take 30 g by mouth 2 (two) times daily.   Yes Historical Provider, MD  lisinopril (PRINIVIL,ZESTRIL) 20 MG tablet Take 20 mg by mouth daily.    Yes Historical Provider, MD  metFORMIN (GLUCOPHAGE) 500 MG tablet Take 500 mg by mouth 2 (two) times daily with a meal.   Yes Historical Provider, MD  metoprolol tartrate (LOPRESSOR) 25 MG tablet Take 25 mg by mouth 2 (two) times daily.   Yes Historical Provider, MD  Multiple Vitamin (MULITIVITAMIN WITH MINERALS) TABS Take 1 tablet by mouth daily.   Yes Historical Provider, MD  polyethylene glycol (MIRALAX / GLYCOLAX) packet Take 17 g by mouth daily. 01/24/13  Yes Mariea Clonts, MD  senna (SENOKOT) 8.6 MG TABS Take 1 tablet by mouth 2 (two) times daily.   Yes Historical Provider, MD  simvastatin (ZOCOR) 5 MG tablet Take 5 mg by mouth at bedtime.   Yes Historical Provider, MD  guaifenesin (ROBITUSSIN) 100 MG/5ML syrup Take 200 mg by mouth 4 (four)  times daily as needed for cough.    Historical Provider, MD  sodium phosphate (FLEET) 7-19 GM/118ML ENEM Place 1 enema rectally daily as needed for moderate constipation or severe constipation. If no bowel movement for more than 24 hours    Historical Provider, MD   BP 117/65  Pulse 86  Temp(Src) 98.3 F (36.8 C) (Oral)  Resp 20  SpO2 95%  Physical Exam General: Well-developed, well-nourished male in no acute distress; appears older than age of record HENT: normocephalic; atraumatic Eyes: pupils equal, round and reactive to light; extraocular  muscles intact Neck: supple Heart: regular rate and rhythm Lungs: Faint expiratory wheezes Abdomen: soft; distended; nontender; bowel sounds hypoactive Extremities: No deformity; full range of motion; pulses normal Neurologic: Sleeping but readily arousable; motor function intact in all extremities and symmetric; no facial droop Skin: Warm and dry Psychiatric: Flat     ED Course  Procedures (including critical care time)    MDM   Nursing notes and vitals signs, including pulse oximetry, reviewed.  Summary of this visit's results, reviewed by myself:  Labs:  Results for orders placed during the hospital encounter of 06/14/13 (from the past 24 hour(s))  CBC WITH DIFFERENTIAL     Status: Abnormal   Collection Time    06/14/13 11:15 PM      Result Value Ref Range   WBC 10.6 (*) 4.0 - 10.5 K/uL   RBC 4.72  4.22 - 5.81 MIL/uL   Hemoglobin 13.2  13.0 - 17.0 g/dL   HCT 41.8  39.0 - 52.0 %   MCV 88.6  78.0 - 100.0 fL   MCH 28.0  26.0 - 34.0 pg   MCHC 31.6  30.0 - 36.0 g/dL   RDW 13.2  11.5 - 15.5 %   Platelets 230  150 - 400 K/uL   Neutrophils Relative % 66  43 - 77 %   Neutro Abs 7.1  1.7 - 7.7 K/uL   Lymphocytes Relative 27  12 - 46 %   Lymphs Abs 2.9  0.7 - 4.0 K/uL   Monocytes Relative 7  3 - 12 %   Monocytes Absolute 0.7  0.1 - 1.0 K/uL   Eosinophils Relative 0  0 - 5 %   Eosinophils Absolute 0.0  0.0 - 0.7 K/uL   Basophils Relative 0  0 - 1 %   Basophils Absolute 0.0  0.0 - 0.1 K/uL  COMPREHENSIVE METABOLIC PANEL     Status: Abnormal   Collection Time    06/14/13 11:15 PM      Result Value Ref Range   Sodium 142  137 - 147 mEq/L   Potassium 3.8  3.7 - 5.3 mEq/L   Chloride 101  96 - 112 mEq/L   CO2 27  19 - 32 mEq/L   Glucose, Bld 103 (*) 70 - 99 mg/dL   BUN 12  6 - 23 mg/dL   Creatinine, Ser 0.71  0.50 - 1.35 mg/dL   Calcium 9.9  8.4 - 10.5 mg/dL   Total Protein 7.5  6.0 - 8.3 g/dL   Albumin 3.8  3.5 - 5.2 g/dL   AST 16  0 - 37 U/L   ALT 17  0 - 53 U/L    Alkaline Phosphatase 70  39 - 117 U/L   Total Bilirubin 0.2 (*) 0.3 - 1.2 mg/dL   GFR calc non Af Amer >90  >90 mL/min   GFR calc Af Amer >90  >90 mL/min  URINALYSIS, ROUTINE W REFLEX  MICROSCOPIC     Status: None   Collection Time    06/15/13 12:57 AM      Result Value Ref Range   Color, Urine YELLOW  YELLOW   APPearance CLEAR  CLEAR   Specific Gravity, Urine 1.011  1.005 - 1.030   pH 6.0  5.0 - 8.0   Glucose, UA NEGATIVE  NEGATIVE mg/dL   Hgb urine dipstick NEGATIVE  NEGATIVE   Bilirubin Urine NEGATIVE  NEGATIVE   Ketones, ur NEGATIVE  NEGATIVE mg/dL   Protein, ur NEGATIVE  NEGATIVE mg/dL   Urobilinogen, UA 1.0  0.0 - 1.0 mg/dL   Nitrite NEGATIVE  NEGATIVE   Leukocytes, UA NEGATIVE  NEGATIVE    Imaging Studies: Dg Abd Acute W/chest  06/14/2013   CLINICAL DATA:  Abdominal pain  EXAM: ACUTE ABDOMEN SERIES (ABDOMEN 2 VIEW & CHEST 1 VIEW)  COMPARISON:  DG ABD ACUTE W/CHEST dated 02/27/2013  FINDINGS: Interstitial prominence is stable. Normal heart size. No pneumothorax. No free intraperitoneal gas. Diffuse colonic distention with air-fluid levels. Prominent stool in the descending colon. No pneumatosis.  IMPRESSION: Colonic distention and air-fluid levels with stool in the distal colon. Colonic ileus versus obstruction pattern. Stable compare with the prior study from January.   Electronically Signed   By: Maryclare Bean M.D.   On: 06/14/2013 23:48    12:00 AM Sleeping comfortably.  1:40 AM Patient sleeping comfortably but readily arousable. Abdomen is soft and nontender. He denies nausea or vomiting. He denies pain or discomfort at this time. Acute abdominal series is stable. Given the patient's benign examination I doubt obstruction at this time. He was advised to return if symptoms worsen.   Wynetta Fines, MD 06/15/13 [phone]

## 2013-06-15 LAB — COMPREHENSIVE METABOLIC PANEL
ALT: 17 U/L (ref 0–53)
AST: 16 U/L (ref 0–37)
Albumin: 3.8 g/dL (ref 3.5–5.2)
Alkaline Phosphatase: 70 U/L (ref 39–117)
BILIRUBIN TOTAL: 0.2 mg/dL — AB (ref 0.3–1.2)
BUN: 12 mg/dL (ref 6–23)
CALCIUM: 9.9 mg/dL (ref 8.4–10.5)
CHLORIDE: 101 meq/L (ref 96–112)
CO2: 27 meq/L (ref 19–32)
Creatinine, Ser: 0.71 mg/dL (ref 0.50–1.35)
GLUCOSE: 103 mg/dL — AB (ref 70–99)
Potassium: 3.8 mEq/L (ref 3.7–5.3)
SODIUM: 142 meq/L (ref 137–147)
Total Protein: 7.5 g/dL (ref 6.0–8.3)

## 2013-06-15 LAB — URINALYSIS, ROUTINE W REFLEX MICROSCOPIC
Bilirubin Urine: NEGATIVE
Glucose, UA: NEGATIVE mg/dL
Hgb urine dipstick: NEGATIVE
KETONES UR: NEGATIVE mg/dL
Leukocytes, UA: NEGATIVE
NITRITE: NEGATIVE
PROTEIN: NEGATIVE mg/dL
Specific Gravity, Urine: 1.011 (ref 1.005–1.030)
Urobilinogen, UA: 1 mg/dL (ref 0.0–1.0)
pH: 6 (ref 5.0–8.0)

## 2013-06-15 NOTE — ED Notes (Signed)
[facility] called for transport.  

## 2013-06-15 NOTE — Discharge Instructions (Signed)
Constipation, Adult  Constipation is when a person:  · Poops (bowel movement) less than 3 times a week.  · Has a hard time pooping.  · Has poop that is dry, hard, or bigger than normal.  HOME CARE   · Eat more fiber, such as fruits, vegetables, whole grains like brown rice, and beans.  · Eat less fatty foods and sugar. This includes French fries, hamburgers, cookies, candy, and soda.  · If you are not getting enough fiber from food, take products with added fiber in them (supplements).  · Drink enough fluid to keep your pee (urine) clear or pale yellow.  · Go to the restroom when you feel like you need to poop. Do not hold it.  · Only take medicine as told by your doctor. Do not take medicines that help you poop (laxatives) without talking to your doctor first.  · Exercise on a regular basis, or as told by your doctor.  GET HELP RIGHT AWAY IF:   · You have bright red blood in your poop (stool).  · Your constipation lasts more than 4 days or gets worse.  · You have belly (abdomen) or butt (rectal) pain.  · You have thin poop (as thin as a pencil).  · You lose weight, and it cannot be explained.  MAKE SURE YOU:   · Understand these instructions.  · Will watch your condition.  · Will get help right away if you are not doing well or get worse.  Document Released: 07/28/2007 Document Revised: 05/03/2011 Document Reviewed: 11/20/2012  [employer]® Patient Information ©2014 [employer].

## 2013-06-15 NOTE — ED Notes (Signed)
Pt made aware of need for urine sample.  

## 2013-09-09 ENCOUNTER — Emergency Department (HOSPITAL_COMMUNITY)
Admission: EM | Admit: 2013-09-09 | Discharge: 2013-09-09 | Disposition: A | Discharge status: Home / Self Care | Payer: Medicaid Other | Attending: Emergency Medicine | Admitting: Emergency Medicine

## 2013-09-09 ENCOUNTER — Encounter (HOSPITAL_COMMUNITY): Payer: Self-pay | Admitting: Emergency Medicine

## 2013-09-09 DIAGNOSIS — J449 Chronic obstructive pulmonary disease, unspecified: Secondary | ICD-10-CM | POA: Diagnosis not present

## 2013-09-09 DIAGNOSIS — E119 Type 2 diabetes mellitus without complications: Secondary | ICD-10-CM | POA: Insufficient documentation

## 2013-09-09 DIAGNOSIS — M542 Cervicalgia: Secondary | ICD-10-CM | POA: Insufficient documentation

## 2013-09-09 DIAGNOSIS — I10 Essential (primary) hypertension: Secondary | ICD-10-CM | POA: Diagnosis not present

## 2013-09-09 DIAGNOSIS — K5900 Constipation, unspecified: Secondary | ICD-10-CM | POA: Diagnosis not present

## 2013-09-09 DIAGNOSIS — Z79899 Other long term (current) drug therapy: Secondary | ICD-10-CM | POA: Insufficient documentation

## 2013-09-09 DIAGNOSIS — F17200 Nicotine dependence, unspecified, uncomplicated: Secondary | ICD-10-CM | POA: Diagnosis not present

## 2013-09-09 DIAGNOSIS — R238 Other skin changes: Secondary | ICD-10-CM | POA: Insufficient documentation

## 2013-09-09 DIAGNOSIS — F259 Schizoaffective disorder, unspecified: Secondary | ICD-10-CM | POA: Diagnosis not present

## 2013-09-09 DIAGNOSIS — J4489 Other specified chronic obstructive pulmonary disease: Secondary | ICD-10-CM | POA: Insufficient documentation

## 2013-09-09 DIAGNOSIS — L853 Xerosis cutis: Secondary | ICD-10-CM

## 2013-09-09 NOTE — ED Notes (Signed)
Received pt from [facility] with c/o pain to right side of neck with itching and itching to mid back. Pt has patch of dry, flaky skin to mid back. No injury.

## 2013-09-09 NOTE — ED Provider Notes (Signed)
Medical screening examination/treatment/procedure(s) were performed by non-physician practitioner and as supervising physician I was immediately available for consultation/collaboration.   EKG Interpretation None        [place], DO 09/09/13 2259
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
## 2013-09-09 NOTE — ED Notes (Signed)
Discharge instructions reviewed with pt. Pt waiting on [facility].

## 2013-09-09 NOTE — Discharge Instructions (Signed)
You may apply lotion to your dry skin. Ice and take ibuprofen or tylenol every 6-8 hours as needed for pain.
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
## 2013-09-15 ENCOUNTER — Emergency Department (HOSPITAL_COMMUNITY)
Admission: EM | Admit: 2013-09-15 | Discharge: 2013-09-16 | Disposition: A | Discharge status: Home / Self Care | Payer: Medicaid Other | Source: Home / Self Care | Attending: Emergency Medicine | Admitting: Emergency Medicine

## 2013-09-15 DIAGNOSIS — J4489 Other specified chronic obstructive pulmonary disease: Secondary | ICD-10-CM | POA: Insufficient documentation

## 2013-09-15 DIAGNOSIS — J449 Chronic obstructive pulmonary disease, unspecified: Secondary | ICD-10-CM | POA: Insufficient documentation

## 2013-09-15 DIAGNOSIS — Z008 Encounter for other general examination: Secondary | ICD-10-CM | POA: Insufficient documentation

## 2013-09-15 DIAGNOSIS — F259 Schizoaffective disorder, unspecified: Secondary | ICD-10-CM | POA: Insufficient documentation

## 2013-09-15 DIAGNOSIS — E119 Type 2 diabetes mellitus without complications: Secondary | ICD-10-CM | POA: Insufficient documentation

## 2013-09-15 DIAGNOSIS — Z79899 Other long term (current) drug therapy: Secondary | ICD-10-CM

## 2013-09-15 DIAGNOSIS — N390 Urinary tract infection, site not specified: Secondary | ICD-10-CM | POA: Diagnosis not present

## 2013-09-15 DIAGNOSIS — I10 Essential (primary) hypertension: Secondary | ICD-10-CM | POA: Insufficient documentation

## 2013-09-15 DIAGNOSIS — Z0000 Encounter for general adult medical examination without abnormal findings: Secondary | ICD-10-CM | POA: Insufficient documentation

## 2013-09-15 DIAGNOSIS — R109 Unspecified abdominal pain: Secondary | ICD-10-CM | POA: Diagnosis present

## 2013-09-15 DIAGNOSIS — F17200 Nicotine dependence, unspecified, uncomplicated: Secondary | ICD-10-CM | POA: Insufficient documentation

## 2013-09-15 DIAGNOSIS — K5900 Constipation, unspecified: Secondary | ICD-10-CM

## 2013-09-16 ENCOUNTER — Encounter (HOSPITAL_COMMUNITY): Payer: Self-pay | Admitting: Emergency Medicine

## 2013-09-16 ENCOUNTER — Emergency Department (HOSPITAL_COMMUNITY)
Admission: EM | Admit: 2013-09-16 | Discharge: 2013-09-17 | Disposition: A | Discharge status: Home / Self Care | Payer: Medicaid Other | Attending: Emergency Medicine | Admitting: Emergency Medicine

## 2013-09-16 ENCOUNTER — Emergency Department (HOSPITAL_COMMUNITY): Payer: Medicaid Other

## 2013-09-16 DIAGNOSIS — N390 Urinary tract infection, site not specified: Secondary | ICD-10-CM

## 2013-09-16 DIAGNOSIS — K5909 Other constipation: Secondary | ICD-10-CM

## 2013-09-16 LAB — CBC WITH DIFFERENTIAL/PLATELET
BASOS PCT: 0 % (ref 0–1)
Basophils Absolute: 0 10*3/uL (ref 0.0–0.1)
Basophils Absolute: 0 10*3/uL (ref 0.0–0.1)
Basophils Relative: 0 % (ref 0–1)
EOS ABS: 0 10*3/uL (ref 0.0–0.7)
Eosinophils Absolute: 0 10*3/uL (ref 0.0–0.7)
Eosinophils Relative: 0 % (ref 0–5)
Eosinophils Relative: 0 % (ref 0–5)
HCT: 39.4 % (ref 39.0–52.0)
HCT: 40.2 % (ref 39.0–52.0)
Hemoglobin: 12.3 g/dL — ABNORMAL LOW (ref 13.0–17.0)
Hemoglobin: 12.4 g/dL — ABNORMAL LOW (ref 13.0–17.0)
LYMPHS ABS: 2.5 10*3/uL (ref 0.7–4.0)
LYMPHS PCT: 30 % (ref 12–46)
Lymphocytes Relative: 25 % (ref 12–46)
Lymphs Abs: 2.6 10*3/uL (ref 0.7–4.0)
MCH: 27.3 pg (ref 26.0–34.0)
MCH: 27.4 pg (ref 26.0–34.0)
MCHC: 30.8 g/dL (ref 30.0–36.0)
MCHC: 31.2 g/dL (ref 30.0–36.0)
MCV: 87.4 fL (ref 78.0–100.0)
MCV: 88.7 fL (ref 78.0–100.0)
MONOS PCT: 7 % (ref 3–12)
Monocytes Absolute: 0.6 10*3/uL (ref 0.1–1.0)
Monocytes Absolute: 0.9 10*3/uL (ref 0.1–1.0)
Monocytes Relative: 9 % (ref 3–12)
NEUTROS ABS: 5.2 10*3/uL (ref 1.7–7.7)
NEUTROS PCT: 63 % (ref 43–77)
NEUTROS PCT: 66 % (ref 43–77)
Neutro Abs: 7.1 10*3/uL (ref 1.7–7.7)
PLATELETS: 194 10*3/uL (ref 150–400)
Platelets: 232 10*3/uL (ref 150–400)
RBC: 4.51 MIL/uL (ref 4.22–5.81)
RBC: 4.53 MIL/uL (ref 4.22–5.81)
RDW: 13 % (ref 11.5–15.5)
RDW: 13.3 % (ref 11.5–15.5)
WBC: 10.6 10*3/uL — ABNORMAL HIGH (ref 4.0–10.5)
WBC: 8.3 10*3/uL (ref 4.0–10.5)

## 2013-09-16 LAB — COMPREHENSIVE METABOLIC PANEL
ALBUMIN: 3.5 g/dL (ref 3.5–5.2)
ALK PHOS: 71 U/L (ref 39–117)
ALT: 21 U/L (ref 0–53)
ANION GAP: 14 (ref 5–15)
AST: 19 U/L (ref 0–37)
BUN: 17 mg/dL (ref 6–23)
CO2: 26 mEq/L (ref 19–32)
Calcium: 9.7 mg/dL (ref 8.4–10.5)
Chloride: 99 mEq/L (ref 96–112)
Creatinine, Ser: 0.76 mg/dL (ref 0.50–1.35)
GFR calc Af Amer: >90 mL/min (ref >90)
GFR calc non Af Amer: >90 mL/min (ref >90)
Glucose, Bld: 149 mg/dL — ABNORMAL HIGH (ref 70–99)
Potassium: 3.9 mEq/L (ref 3.7–5.3)
SODIUM: 139 meq/L (ref 137–147)
TOTAL PROTEIN: 7.1 g/dL (ref 6.0–8.3)
Total Bilirubin: 0.4 mg/dL (ref 0.3–1.2)

## 2013-09-16 LAB — URINALYSIS, ROUTINE W REFLEX MICROSCOPIC
Bilirubin Urine: NEGATIVE
Glucose, UA: NEGATIVE mg/dL
HGB URINE DIPSTICK: NEGATIVE
Ketones, ur: NEGATIVE mg/dL
Nitrite: NEGATIVE
PH: 6 (ref 5.0–8.0)
Protein, ur: NEGATIVE mg/dL
SPECIFIC GRAVITY, URINE: 1.013 (ref 1.005–1.030)
Urobilinogen, UA: 1 mg/dL (ref 0.0–1.0)

## 2013-09-16 LAB — LIPASE, BLOOD: Lipase: 30 U/L (ref 11–59)

## 2013-09-16 LAB — URINE MICROSCOPIC-ADD ON

## 2013-09-16 NOTE — ED Provider Notes (Signed)
Medical screening examination/treatment/procedure(s) were performed by non-physician practitioner and as supervising physician I was immediately available for consultation/collaboration.   EKG Interpretation None       Varney Biles, MD 09/16/13 2308
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
## 2013-09-16 NOTE — ED Notes (Signed)
Results faxed to [facility]

## 2013-09-16 NOTE — ED Notes (Signed)
Pt brought to ED by [facility] security for CBC, pt is taking Clozaril and needs lab work for refill

## 2013-09-16 NOTE — ED Notes (Signed)
Per Beverly Sessions ok to send pt back to facility. Pt remains in [place] security custody

## 2013-09-16 NOTE — ED Provider Notes (Signed)
CSN: [id number]     Arrival date & time 09/15/13  2352 History   First MD Initiated Contact with Patient 09/16/13 0102     Chief Complaint  Patient presents with  . Medical Clearance     (Consider location/radiation/quality/duration/timing/severity/associated sxs/prior Treatment) HPI Comments: Patient was sent from [facility] to have a CBC due to the patient's Clozaril use, and he's been off of this medication for several weeks.  They want to make, sure that he does not have any sign of infection before restarting this medication.  Patient states he has not been ill with any febrile illness.  No nausea, vomiting, diarrhea, and URI symptoms, cough  The history is provided by the patient.    Past Medical History  Diagnosis Date  . Hypertension   . Diabetes mellitus   . Schizophrenia, schizo-affective   . COPD (chronic obstructive pulmonary disease)   . Constipation   . Fecal impaction    History reviewed. No pertinent past surgical history. No family history on file. History  Substance Use Topics  . Smoking status: Current Every Day Smoker -- 1.50 packs/day    Types: Cigarettes  . Smokeless tobacco: Not on file  . Alcohol Use: No    Review of Systems  Constitutional: Negative for fever.  Respiratory: Negative for cough and shortness of breath.   Gastrointestinal: Negative for vomiting and blood in stool.  Musculoskeletal: Negative for myalgias.  All other systems reviewed and are negative.     Allergies  Review of patient's allergies indicates no known allergies.  Home Medications   Prior to Admission medications   Medication Sig Start Date End Date Taking? Authorizing Provider  acetaminophen (TYLENOL) 325 MG tablet Take 650 mg by mouth every 6 (six) hours as needed for mild pain, fever or headache.     Historical Provider, MD  albuterol (PROVENTIL HFA;VENTOLIN HFA) 108 (90 BASE) MCG/ACT inhaler Inhale 2 puffs into the lungs every 4 (four) hours as needed for wheezing  or shortness of breath.     Historical Provider, MD  benztropine (COGENTIN) 1 MG tablet Take 1 mg by mouth at bedtime.     Historical Provider, MD  cloZAPine (CLOZARIL) 100 MG tablet Take 100-400 mg by mouth 3 (three) times daily. Takes 1 tablet every morning and afternoon Takes 4 tablets at bedtime    Historical Provider, MD  guaifenesin (ROBITUSSIN) 100 MG/5ML syrup Take 200 mg by mouth 4 (four) times daily as needed for cough.    Historical Provider, MD  haloperidol (HALDOL) 10 MG tablet Take 10 mg by mouth at bedtime.    Historical Provider, MD  hydrochlorothiazide (HYDRODIURIL) 25 MG tablet Take 25 mg by mouth daily.    Historical Provider, MD  lactulose (CHRONULAC) 10 GM/15ML solution Take 30 g by mouth 2 (two) times daily.    Historical Provider, MD  lisinopril (PRINIVIL,ZESTRIL) 20 MG tablet Take 20 mg by mouth daily.     Historical Provider, MD  metFORMIN (GLUCOPHAGE) 500 MG tablet Take 500 mg by mouth 2 (two) times daily with a meal.    Historical Provider, MD  metoprolol tartrate (LOPRESSOR) 25 MG tablet Take 25 mg by mouth 2 (two) times daily.    Historical Provider, MD  Multiple Vitamin (MULITIVITAMIN WITH MINERALS) TABS Take 1 tablet by mouth daily.    Historical Provider, MD  polyethylene glycol (MIRALAX / GLYCOLAX) packet Take 17 g by mouth daily. 01/24/13   Mariea Clonts, MD  senna (SENOKOT) 8.6 MG TABS Take 1 tablet  by mouth 2 (two) times daily.    Historical Provider, MD  simvastatin (ZOCOR) 5 MG tablet Take 5 mg by mouth at bedtime.    Historical Provider, MD  sodium phosphate (FLEET) 7-19 GM/118ML ENEM Place 1 enema rectally daily as needed for moderate constipation or severe constipation. If no bowel movement for more than 24 hours    Historical Provider, MD   BP 95/56  Pulse 84  Temp(Src) 98.2 F (36.8 C) (Oral)  Resp 20  SpO2 96% Physical Exam  Vitals reviewed. Constitutional: He is oriented to person, place, and time. He appears well-developed and well-nourished.   HENT:  Head: Normocephalic.  Eyes: Pupils are equal, round, and reactive to light.  Neck: Normal range of motion.  Cardiovascular: Normal rate and regular rhythm.   Pulmonary/Chest: Effort normal.  Musculoskeletal: Normal range of motion.  Neurological: He is alert and oriented to person, place, and time.  Skin: Skin is warm.  Psychiatric: He has a normal mood and affect.    ED Course  Procedures (including critical care time) Labs Review Labs Reviewed  CBC WITH DIFFERENTIAL - Abnormal; Notable for the following:    WBC 10.6 (*)    Hemoglobin 12.4 (*)    All other components within normal limits    Imaging Review No results found.   EKG Interpretation None      MDM   Final diagnoses:  Routine medical exam         Garald Balding, NP 09/16/13 0115

## 2013-09-16 NOTE — Discharge Instructions (Signed)
CBC was performed per your request.  The patient can  continuing  medical therapy for his mental illness

## 2013-09-17 MED ORDER — LACTULOSE 10 GM/15ML PO SOLN: 30.0000 g | Freq: Two times a day (BID) | ORAL | Status: DC

## 2013-09-17 MED ORDER — DICYCLOMINE HCL 20 MG PO TABS
10.0000 mg | ORAL_TABLET | Freq: Once | ORAL | Status: AC
  Administered 2013-09-17: 10 mg via ORAL
  Filled 2013-09-17: qty 1

## 2013-09-17 MED ORDER — DICYCLOMINE HCL 20 MG PO TABS: 20.0000 mg | ORAL_TABLET | Freq: Two times a day (BID) | ORAL | Status: DC | PRN

## 2013-09-17 MED ORDER — CIPROFLOXACIN HCL 500 MG PO TABS: 500.0000 mg | ORAL_TABLET | Freq: Two times a day (BID) | ORAL | Status: DC

## 2013-09-17 NOTE — Discharge Instructions (Signed)
Continue lactulose and MiraLax. Follow up gastroenterologist. Return for worsening pain, persistent vomiting, fever or for any concerns.   Constipation Constipation is when a person has fewer than three bowel movements a week, has difficulty having a bowel movement, or has stools that are dry, hard, or larger than normal. As people grow older, constipation is more common. If you try to fix constipation with medicines that make you have a bowel movement (laxatives), the problem may get worse. Long-term laxative use may cause the muscles of the colon to become weak. A low-fiber diet, not taking in enough fluids, and taking certain medicines may make constipation worse.  CAUSES   Certain medicines, such as antidepressants, pain medicine, iron supplements, antacids, and water pills.   Certain diseases, such as diabetes, irritable bowel syndrome (IBS), thyroid disease, or depression.   Not drinking enough water.   Not eating enough fiber-rich foods.   Stress or travel.   Lack of physical activity or exercise.   Ignoring the urge to have a bowel movement.   Using laxatives too much.  SIGNS AND SYMPTOMS   Having fewer than three bowel movements a week.   Straining to have a bowel movement.   Having stools that are hard, dry, or larger than normal.   Feeling full or bloated.   Pain in the lower abdomen.   Not feeling relief after having a bowel movement.  DIAGNOSIS  Your health care provider will take a medical history and perform a physical exam. Further testing may be done for severe constipation. Some tests may include:  A barium enema X-ray to examine your rectum, colon, and, sometimes, your small intestine.   A sigmoidoscopy to examine your lower colon.   A colonoscopy to examine your entire colon. TREATMENT  Treatment will depend on the severity of your constipation and what is causing it. Some dietary treatments include drinking more fluids and eating more  fiber-rich foods. Lifestyle treatments may include regular exercise. If these diet and lifestyle recommendations do not help, your health care provider may recommend taking over-the-counter laxative medicines to help you have bowel movements. Prescription medicines may be prescribed if over-the-counter medicines do not work.  HOME CARE INSTRUCTIONS   Eat foods that have a lot of fiber, such as fruits, vegetables, whole grains, and beans.  Limit foods high in fat and processed sugars, such as french fries, hamburgers, cookies, candies, and soda.   A fiber supplement may be added to your diet if you cannot get enough fiber from foods.   Drink enough fluids to keep your urine clear or pale yellow.   Exercise regularly or as directed by your health care provider.   Go to the restroom when you have the urge to go. Do not hold it.   Only take over-the-counter or prescription medicines as directed by your health care provider. Do not take other medicines for constipation without talking to your health care provider first.  [place] IF:   You have bright red blood in your stool.   Your constipation lasts for more than 4 days or gets worse.   You have abdominal or rectal pain.   You have thin, pencil-like stools.   You have unexplained weight loss. MAKE SURE YOU:   Understand these instructions.  Will watch your condition.  Will get help right away if you are not doing well or get worse. Document Released: 11/07/2003 Document Revised: 02/13/2013 Document Reviewed: 11/20/2012 [facility] Patient Information 2015 [place], [place]. This information  is not intended to replace advice given to you by your health care provider. Make sure you discuss any questions you have with your health care provider.

## 2013-09-17 NOTE — ED Provider Notes (Signed)
CSN: [id number]     Arrival date & time 09/16/13  2139 History   First MD Initiated Contact with Patient 09/16/13 2313     Chief Complaint  Patient presents with  . Abdominal Pain     (Consider location/radiation/quality/duration/timing/severity/associated sxs/prior Treatment) HPI Patient has a history of chronic constipation for greater than 10 years. He is on MiraLax for constipation but states he has not had in the last several days. He's sent here from [facility] under [facility] for evaluation of his constipation. Patient's states he has not had a bowel movement for the past 2 weeks. He states he is passing gas. Patient is being without difficulty. He denies any nausea or vomiting. He has diffuse abdominal cramps. He has no fevers or chills. Denies any urinary symptoms. Past Medical History  Diagnosis Date  . Hypertension   . Diabetes mellitus   . Schizophrenia, schizo-affective   . COPD (chronic obstructive pulmonary disease)   . Constipation   . Fecal impaction    History reviewed. No pertinent past surgical history. No family history on file. History  Substance Use Topics  . Smoking status: Current Every Day Smoker -- 1.50 packs/day    Types: Cigarettes  . Smokeless tobacco: Not on file  . Alcohol Use: No    Review of Systems  Constitutional: Negative for fever and chills.  Respiratory: Negative for shortness of breath.   Cardiovascular: Negative for chest pain.  Gastrointestinal: Positive for abdominal pain and constipation. Negative for nausea, vomiting, diarrhea and blood in stool.  Genitourinary: Negative for dysuria, frequency and flank pain.  Musculoskeletal: Negative for back pain, neck pain and neck stiffness.  Skin: Negative for rash and wound.  Neurological: Negative for dizziness, weakness, light-headedness, numbness and headaches.  All other systems reviewed and are negative.     Allergies  Review of patient's allergies indicates no known allergies.  Home  Medications   Prior to Admission medications   Medication Sig Start Date End Date Taking? Authorizing Provider  benztropine (COGENTIN) 1 MG tablet Take 1 mg by mouth at bedtime.    Yes Historical Provider, MD  cloZAPine (CLOZARIL) 100 MG tablet Take 100-400 mg by mouth 3 (three) times daily. Takes 1 tablet every morning and afternoon Takes 4 tablets at bedtime   Yes Historical Provider, MD  haloperidol (HALDOL) 10 MG tablet Take 10 mg by mouth at bedtime.   Yes Historical Provider, MD  hydrochlorothiazide (HYDRODIURIL) 25 MG tablet Take 25 mg by mouth daily.   Yes Historical Provider, MD  lisinopril (PRINIVIL,ZESTRIL) 20 MG tablet Take 20 mg by mouth daily.    Yes Historical Provider, MD  metFORMIN (GLUCOPHAGE) 500 MG tablet Take 500 mg by mouth 2 (two) times daily with a meal.   Yes Historical Provider, MD  metoprolol tartrate (LOPRESSOR) 25 MG tablet Take 25 mg by mouth 2 (two) times daily.   Yes Historical Provider, MD  Multiple Vitamin (MULITIVITAMIN WITH MINERALS) TABS Take 1 tablet by mouth daily.   Yes Historical Provider, MD  senna (SENOKOT) 8.6 MG TABS Take 1 tablet by mouth 2 (two) times daily.   Yes Historical Provider, MD  simvastatin (ZOCOR) 5 MG tablet Take 5 mg by mouth at bedtime.   Yes Historical Provider, MD  acetaminophen (TYLENOL) 325 MG tablet Take 650 mg by mouth every 6 (six) hours as needed for mild pain, fever or headache.     Historical Provider, MD  albuterol (PROVENTIL HFA;VENTOLIN HFA) 108 (90 BASE) MCG/ACT inhaler Inhale 2 puffs into  the lungs every 4 (four) hours as needed for wheezing or shortness of breath.     Historical Provider, MD  guaifenesin (ROBITUSSIN) 100 MG/5ML syrup Take 200 mg by mouth 4 (four) times daily as needed for cough.    Historical Provider, MD  lactulose (CHRONULAC) 10 GM/15ML solution Take 30 g by mouth 2 (two) times daily.    Historical Provider, MD  polyethylene glycol (MIRALAX / GLYCOLAX) packet Take 17 g by mouth daily. 01/24/13   Mariea Clonts, MD  sodium phosphate (FLEET) 7-19 GM/118ML ENEM Place 1 enema rectally daily as needed for moderate constipation or severe constipation. If no bowel movement for more than 24 hours    Historical Provider, MD   BP 99/57  Pulse 88  Temp(Src) 97.9 F (36.6 C) (Oral)  Resp 18  SpO2 99% Physical Exam  Nursing note and vitals reviewed. Constitutional: He is oriented to person, place, and time. He appears well-developed and well-nourished. No distress.  HENT:  Head: Normocephalic and atraumatic.  Mouth/Throat: Oropharynx is clear and moist.  Eyes: EOM are normal. Pupils are equal, round, and reactive to light.  Neck: Normal range of motion. Neck supple.  Cardiovascular: Normal rate and regular rhythm.   Pulmonary/Chest: Effort normal and breath sounds normal. No respiratory distress. He has no wheezes. He has no rales. He exhibits no tenderness.  Abdominal: Soft. Bowel sounds are normal. He exhibits distension. He exhibits no mass. There is no tenderness. There is no rebound and no guarding.  Abdomen is distended but soft. No focal tenderness. No rebound or guarding.  Musculoskeletal: Normal range of motion. He exhibits no edema and no tenderness.  No CVA tenderness.  Neurological: He is alert and oriented to person, place, and time.  Moves all extremities without deficit. Sensation is grossly intact.  Skin: Skin is warm and dry. No rash noted. No erythema.  Psychiatric: He has a normal mood and affect. His behavior is normal.    ED Course  Procedures (including critical care time) Labs Review Labs Reviewed  COMPREHENSIVE METABOLIC PANEL - Abnormal; Notable for the following:    Glucose, Bld 149 (*)    All other components within normal limits  CBC WITH DIFFERENTIAL - Abnormal; Notable for the following:    Hemoglobin 12.3 (*)    All other components within normal limits  URINALYSIS, ROUTINE W REFLEX MICROSCOPIC - Abnormal; Notable for the following:    APPearance CLOUDY (*)     Leukocytes, UA LARGE (*)    All other components within normal limits  URINE MICROSCOPIC-ADD ON - Abnormal; Notable for the following:    Bacteria, UA FEW (*)    All other components within normal limits  LIPASE, BLOOD    Imaging Review No results found.   EKG Interpretation None      MDM   Final diagnoses:  None    Patient with chronic constipation. Abdominal exam is benign. Labs are within normal limits. Plain films demonstrate no obstruction but large colonic stool burden. Patient advised to take MiraLax and lactulose as previously prescribed. You need to followup with gastroenterology for persistent constipation. No emergent medical issues. Patient is cleared to go back to [facility].    Julianne Rice, MD 09/17/13 [phone]

## 2015-02-13 ENCOUNTER — Emergency Department (HOSPITAL_COMMUNITY): Payer: Medicaid Other

## 2015-02-13 ENCOUNTER — Encounter (HOSPITAL_COMMUNITY): Payer: Self-pay | Admitting: Emergency Medicine

## 2015-02-13 ENCOUNTER — Inpatient Hospital Stay (HOSPITAL_COMMUNITY)
Admission: EM | Admit: 2015-02-13 | Discharge: 2015-02-20 | DRG: 871 | Disposition: A | Discharge status: Home Health | Payer: Medicaid Other | Attending: Internal Medicine | Admitting: Internal Medicine

## 2015-02-13 DIAGNOSIS — F2089 Other schizophrenia: Secondary | ICD-10-CM | POA: Diagnosis not present

## 2015-02-13 DIAGNOSIS — J449 Chronic obstructive pulmonary disease, unspecified: Secondary | ICD-10-CM | POA: Diagnosis not present

## 2015-02-13 DIAGNOSIS — E1142 Type 2 diabetes mellitus with diabetic polyneuropathy: Secondary | ICD-10-CM

## 2015-02-13 DIAGNOSIS — R0600 Dyspnea, unspecified: Secondary | ICD-10-CM | POA: Diagnosis not present

## 2015-02-13 DIAGNOSIS — J189 Pneumonia, unspecified organism: Secondary | ICD-10-CM | POA: Diagnosis present

## 2015-02-13 DIAGNOSIS — F209 Schizophrenia, unspecified: Secondary | ICD-10-CM | POA: Diagnosis present

## 2015-02-13 DIAGNOSIS — Y95 Nosocomial condition: Secondary | ICD-10-CM | POA: Diagnosis present

## 2015-02-13 DIAGNOSIS — R0603 Acute respiratory distress: Secondary | ICD-10-CM

## 2015-02-13 DIAGNOSIS — J441 Chronic obstructive pulmonary disease with (acute) exacerbation: Secondary | ICD-10-CM | POA: Diagnosis not present

## 2015-02-13 DIAGNOSIS — K5900 Constipation, unspecified: Secondary | ICD-10-CM | POA: Diagnosis present

## 2015-02-13 DIAGNOSIS — Z79899 Other long term (current) drug therapy: Secondary | ICD-10-CM | POA: Diagnosis not present

## 2015-02-13 DIAGNOSIS — A419 Sepsis, unspecified organism: Secondary | ICD-10-CM | POA: Diagnosis not present

## 2015-02-13 DIAGNOSIS — E1140 Type 2 diabetes mellitus with diabetic neuropathy, unspecified: Secondary | ICD-10-CM | POA: Diagnosis present

## 2015-02-13 DIAGNOSIS — J440 Chronic obstructive pulmonary disease with acute lower respiratory infection: Secondary | ICD-10-CM | POA: Diagnosis present

## 2015-02-13 DIAGNOSIS — F203 Undifferentiated schizophrenia: Secondary | ICD-10-CM | POA: Diagnosis not present

## 2015-02-13 DIAGNOSIS — J9601 Acute respiratory failure with hypoxia: Secondary | ICD-10-CM | POA: Diagnosis not present

## 2015-02-13 DIAGNOSIS — K219 Gastro-esophageal reflux disease without esophagitis: Secondary | ICD-10-CM | POA: Diagnosis present

## 2015-02-13 DIAGNOSIS — I10 Essential (primary) hypertension: Secondary | ICD-10-CM | POA: Diagnosis not present

## 2015-02-13 DIAGNOSIS — E1149 Type 2 diabetes mellitus with other diabetic neurological complication: Secondary | ICD-10-CM | POA: Diagnosis present

## 2015-02-13 DIAGNOSIS — J9600 Acute respiratory failure, unspecified whether with hypoxia or hypercapnia: Secondary | ICD-10-CM | POA: Diagnosis present

## 2015-02-13 DIAGNOSIS — K229 Disease of esophagus, unspecified: Secondary | ICD-10-CM | POA: Insufficient documentation

## 2015-02-13 DIAGNOSIS — E0840 Diabetes mellitus due to underlying condition with diabetic neuropathy, unspecified: Secondary | ICD-10-CM | POA: Diagnosis not present

## 2015-02-13 DIAGNOSIS — F17210 Nicotine dependence, cigarettes, uncomplicated: Secondary | ICD-10-CM | POA: Diagnosis present

## 2015-02-13 DIAGNOSIS — J430 Unilateral pulmonary emphysema [MacLeod's syndrome]: Secondary | ICD-10-CM | POA: Diagnosis not present

## 2015-02-13 DIAGNOSIS — J438 Other emphysema: Secondary | ICD-10-CM | POA: Diagnosis not present

## 2015-02-13 LAB — CBC WITH DIFFERENTIAL/PLATELET
Basophils Absolute: 0 10*3/uL (ref 0.0–0.1)
Basophils Relative: 0 %
Eosinophils Absolute: 0 10*3/uL (ref 0.0–0.7)
Eosinophils Relative: 0 %
HEMATOCRIT: 38.3 % — AB (ref 39.0–52.0)
HEMOGLOBIN: 12.2 g/dL — AB (ref 13.0–17.0)
LYMPHS ABS: 2.8 10*3/uL (ref 0.7–4.0)
LYMPHS PCT: 23 %
MCH: 27.9 pg (ref 26.0–34.0)
MCHC: 31.9 g/dL (ref 30.0–36.0)
MCV: 87.6 fL (ref 78.0–100.0)
MONOS PCT: 9 %
Monocytes Absolute: 1 10*3/uL (ref 0.1–1.0)
NEUTROS ABS: 8.2 10*3/uL — AB (ref 1.7–7.7)
NEUTROS PCT: 68 %
Platelets: 190 10*3/uL (ref 150–400)
RBC: 4.37 MIL/uL (ref 4.22–5.81)
RDW: 13.3 % (ref 11.5–15.5)
WBC: 12 10*3/uL — AB (ref 4.0–10.5)

## 2015-02-13 LAB — I-STAT TROPONIN, ED: TROPONIN I, POC: 0 ng/mL (ref 0.00–0.08)

## 2015-02-13 LAB — BLOOD GAS, ARTERIAL
ACID-BASE EXCESS: 1.8 mmol/L (ref 0.0–2.0)
Bicarbonate: 26.5 mEq/L — ABNORMAL HIGH (ref 20.0–24.0)
Drawn by: 232811
O2 Content: 5 L/min
O2 SAT: 86.8 %
Patient temperature: 99.3
TCO2: 24.4 mmol/L (ref 0–100)
pCO2 arterial: 45.5 mmHg — ABNORMAL HIGH (ref 35.0–45.0)
pH, Arterial: 7.386 (ref 7.350–7.450)
pO2, Arterial: 56.1 mmHg — ABNORMAL LOW (ref 80.0–100.0)

## 2015-02-13 LAB — CBC
HCT: 32.8 % — ABNORMAL LOW (ref 39.0–52.0)
Hemoglobin: 10.4 g/dL — ABNORMAL LOW (ref 13.0–17.0)
MCH: 27.7 pg (ref 26.0–34.0)
MCHC: 31.7 g/dL (ref 30.0–36.0)
MCV: 87.2 fL (ref 78.0–100.0)
PLATELETS: 176 10*3/uL (ref 150–400)
RBC: 3.76 MIL/uL — AB (ref 4.22–5.81)
RDW: 13.4 % (ref 11.5–15.5)
WBC: 11.2 10*3/uL — ABNORMAL HIGH (ref 4.0–10.5)

## 2015-02-13 LAB — BASIC METABOLIC PANEL
Anion gap: 11 (ref 5–15)
BUN: 25 mg/dL — ABNORMAL HIGH (ref 6–20)
CHLORIDE: 95 mmol/L — AB (ref 101–111)
CO2: 29 mmol/L (ref 22–32)
Calcium: 8.9 mg/dL (ref 8.9–10.3)
Creatinine, Ser: 1.16 mg/dL (ref 0.61–1.24)
GFR calc non Af Amer: >60 mL/min (ref >60)
Glucose, Bld: 144 mg/dL — ABNORMAL HIGH (ref 65–99)
POTASSIUM: 4.2 mmol/L (ref 3.5–5.1)
Sodium: 135 mmol/L (ref 135–145)

## 2015-02-13 LAB — BRAIN NATRIURETIC PEPTIDE: B Natriuretic Peptide: 5.9 pg/mL (ref 0.0–100.0)

## 2015-02-13 LAB — CBG MONITORING, ED
Glucose-Capillary: 139 mg/dL — ABNORMAL HIGH (ref 65–99)
Glucose-Capillary: 130 mg/dL — ABNORMAL HIGH (ref 65–99)

## 2015-02-13 LAB — I-STAT CG4 LACTIC ACID, ED: Lactic Acid, Venous: 0.91 mmol/L (ref 0.5–2.0)

## 2015-02-13 LAB — GLUCOSE, CAPILLARY: GLUCOSE-CAPILLARY: 219 mg/dL — AB (ref 65–99)

## 2015-02-13 MED ORDER — ALBUTEROL SULFATE (2.5 MG/3ML) 0.083% IN NEBU: 2.5000 mg | INHALATION_SOLUTION | RESPIRATORY_TRACT | Status: DC | PRN

## 2015-02-13 MED ORDER — ALBUTEROL (5 MG/ML) CONTINUOUS INHALATION SOLN
10.0000 mg | INHALATION_SOLUTION | RESPIRATORY_TRACT | Status: AC
  Administered 2015-02-13: 10 mg via RESPIRATORY_TRACT
  Filled 2015-02-13: qty 20

## 2015-02-13 MED ORDER — SODIUM CHLORIDE 0.9 % IV BOLUS (SEPSIS)
1000.0000 mL | INTRAVENOUS | Status: AC
  Administered 2015-02-13: 1000 mL via INTRAVENOUS

## 2015-02-13 MED ORDER — SODIUM CHLORIDE 0.9 % IV BOLUS (SEPSIS): 500.0000 mL | INTRAVENOUS | Status: DC

## 2015-02-13 MED ORDER — ACETAMINOPHEN 325 MG PO TABS
650.0000 mg | ORAL_TABLET | Freq: Four times a day (QID) | ORAL | Status: DC | PRN
  Administered 2015-02-14: 650 mg via ORAL
  Filled 2015-02-13: qty 2

## 2015-02-13 MED ORDER — ACETAMINOPHEN 650 MG RE SUPP: 650.0000 mg | Freq: Four times a day (QID) | RECTAL | Status: DC | PRN

## 2015-02-13 MED ORDER — ONDANSETRON HCL 4 MG/2ML IJ SOLN: 4.0000 mg | Freq: Four times a day (QID) | INTRAMUSCULAR | Status: DC | PRN

## 2015-02-13 MED ORDER — ONDANSETRON HCL 4 MG/2ML IJ SOLN: 4.0000 mg | Freq: Three times a day (TID) | INTRAMUSCULAR | Status: DC | PRN

## 2015-02-13 MED ORDER — DONEPEZIL HCL 5 MG PO TABS
5.0000 mg | ORAL_TABLET | Freq: Every day | ORAL | Status: DC
  Administered 2015-02-14 – 2015-02-19 (×7): 5 mg via ORAL
  Filled 2015-02-13 (×7): qty 1

## 2015-02-13 MED ORDER — SENNA 8.6 MG PO TABS
1.0000 | ORAL_TABLET | Freq: Two times a day (BID) | ORAL | Status: DC
  Administered 2015-02-14 (×2): 8.6 mg via ORAL
  Filled 2015-02-13 (×2): qty 1

## 2015-02-13 MED ORDER — SODIUM CHLORIDE 0.9 % IJ SOLN
3.0000 mL | Freq: Two times a day (BID) | INTRAMUSCULAR | Status: DC
  Administered 2015-02-14 – 2015-02-17 (×7): 3 mL via INTRAVENOUS

## 2015-02-13 MED ORDER — IPRATROPIUM-ALBUTEROL 0.5-2.5 (3) MG/3ML IN SOLN
3.0000 mL | Freq: Once | RESPIRATORY_TRACT | Status: AC
  Administered 2015-02-13: 3 mL via RESPIRATORY_TRACT
  Filled 2015-02-13: qty 3

## 2015-02-13 MED ORDER — HYDROCODONE-ACETAMINOPHEN 5-325 MG PO TABS
1.0000 | ORAL_TABLET | ORAL | Status: DC | PRN
  Administered 2015-02-16: 1 via ORAL
  Filled 2015-02-13: qty 1

## 2015-02-13 MED ORDER — INSULIN ASPART 100 UNIT/ML ~~LOC~~ SOLN
0.0000 [IU] | Freq: Every day | SUBCUTANEOUS | Status: DC
  Administered 2015-02-14 (×2): 2 [IU] via SUBCUTANEOUS
  Administered 2015-02-15 – 2015-02-16 (×2): 3 [IU] via SUBCUTANEOUS

## 2015-02-13 MED ORDER — SODIUM CHLORIDE 0.9 % IV BOLUS (SEPSIS)
1000.0000 mL | Freq: Once | INTRAVENOUS | Status: AC
  Administered 2015-02-13: 1000 mL via INTRAVENOUS

## 2015-02-13 MED ORDER — INSULIN ASPART 100 UNIT/ML ~~LOC~~ SOLN
0.0000 [IU] | Freq: Three times a day (TID) | SUBCUTANEOUS | Status: DC
  Administered 2015-02-14: 3 [IU] via SUBCUTANEOUS
  Administered 2015-02-14: 1 [IU] via SUBCUTANEOUS
  Administered 2015-02-14: 3 [IU] via SUBCUTANEOUS
  Administered 2015-02-15: 1 [IU] via SUBCUTANEOUS
  Administered 2015-02-15: 5 [IU] via SUBCUTANEOUS
  Administered 2015-02-15: 2 [IU] via SUBCUTANEOUS
  Administered 2015-02-16: 5 [IU] via SUBCUTANEOUS
  Administered 2015-02-16: 2 [IU] via SUBCUTANEOUS
  Administered 2015-02-17: 3 [IU] via SUBCUTANEOUS
  Administered 2015-02-17 – 2015-02-18 (×2): 2 [IU] via SUBCUTANEOUS
  Administered 2015-02-19: 3 [IU] via SUBCUTANEOUS
  Administered 2015-02-19 – 2015-02-20 (×2): 2 [IU] via SUBCUTANEOUS
  Administered 2015-02-20: 5 [IU] via SUBCUTANEOUS

## 2015-02-13 MED ORDER — METOPROLOL TARTRATE 25 MG PO TABS
25.0000 mg | ORAL_TABLET | Freq: Two times a day (BID) | ORAL | Status: DC
  Administered 2015-02-14 – 2015-02-20 (×13): 25 mg via ORAL
  Filled 2015-02-13 (×13): qty 1

## 2015-02-13 MED ORDER — ACETAMINOPHEN 325 MG PO TABS
650.0000 mg | ORAL_TABLET | Freq: Once | ORAL | Status: AC
  Administered 2015-02-13: 650 mg via ORAL
  Filled 2015-02-13: qty 2

## 2015-02-13 MED ORDER — ONDANSETRON HCL 4 MG PO TABS
4.0000 mg | ORAL_TABLET | Freq: Four times a day (QID) | ORAL | Status: DC | PRN
  Administered 2015-02-14: 4 mg via ORAL
  Filled 2015-02-13: qty 1

## 2015-02-13 MED ORDER — HALOPERIDOL 5 MG PO TABS
10.0000 mg | ORAL_TABLET | Freq: Every day | ORAL | Status: DC
  Administered 2015-02-14 – 2015-02-19 (×8): 10 mg via ORAL
  Filled 2015-02-13 (×10): qty 2

## 2015-02-13 MED ORDER — ACETAMINOPHEN 500 MG PO TABS
1000.0000 mg | ORAL_TABLET | Freq: Once | ORAL | Status: AC
Start: 2015-02-13 — End: 2015-02-13
  Administered 2015-02-13: 1000 mg via ORAL
  Filled 2015-02-13: qty 2

## 2015-02-13 MED ORDER — SIMVASTATIN 10 MG PO TABS
5.0000 mg | ORAL_TABLET | Freq: Every day | ORAL | Status: DC
  Administered 2015-02-14 – 2015-02-19 (×7): 5 mg via ORAL
  Filled 2015-02-13 (×7): qty 1

## 2015-02-13 MED ORDER — DEXTROSE 5 % IV SOLN
1.0000 g | Freq: Three times a day (TID) | INTRAVENOUS | Status: DC
  Administered 2015-02-13 – 2015-02-17 (×11): 1 g via INTRAVENOUS
  Filled 2015-02-13 (×15): qty 1

## 2015-02-13 MED ORDER — SODIUM CHLORIDE 0.9 % IV BOLUS (SEPSIS)
500.0000 mL | Freq: Once | INTRAVENOUS | Status: AC
  Administered 2015-02-13: 500 mL via INTRAVENOUS

## 2015-02-13 MED ORDER — GABAPENTIN 300 MG PO CAPS
300.0000 mg | ORAL_CAPSULE | Freq: Three times a day (TID) | ORAL | Status: DC
  Administered 2015-02-14 – 2015-02-20 (×21): 300 mg via ORAL
  Filled 2015-02-13 (×21): qty 1

## 2015-02-13 MED ORDER — ALBUTEROL SULFATE (2.5 MG/3ML) 0.083% IN NEBU
2.5000 mg | INHALATION_SOLUTION | RESPIRATORY_TRACT | Status: DC | PRN
  Administered 2015-02-16: 2.5 mg via RESPIRATORY_TRACT
  Filled 2015-02-13: qty 3

## 2015-02-13 MED ORDER — HYDROCHLOROTHIAZIDE 25 MG PO TABS
25.0000 mg | ORAL_TABLET | Freq: Every day | ORAL | Status: DC
  Administered 2015-02-14 – 2015-02-20 (×7): 25 mg via ORAL
  Filled 2015-02-13 (×7): qty 1

## 2015-02-13 MED ORDER — SODIUM CHLORIDE 0.9 % IV SOLN
INTRAVENOUS | Status: DC
  Administered 2015-02-13: 23:00:00 via INTRAVENOUS

## 2015-02-13 MED ORDER — ENOXAPARIN SODIUM 40 MG/0.4ML ~~LOC~~ SOLN
40.0000 mg | Freq: Every day | SUBCUTANEOUS | Status: DC
  Administered 2015-02-14 – 2015-02-19 (×7): 40 mg via SUBCUTANEOUS
  Filled 2015-02-13 (×7): qty 0.4

## 2015-02-13 MED ORDER — METHYLPREDNISOLONE SODIUM SUCC 125 MG IJ SOLR
125.0000 mg | Freq: Once | INTRAMUSCULAR | Status: AC
  Administered 2015-02-13: 125 mg via INTRAVENOUS
  Filled 2015-02-13: qty 2

## 2015-02-13 MED ORDER — GUAIFENESIN 100 MG/5ML PO SOLN: 200.0000 mg | Freq: Four times a day (QID) | ORAL | Status: DC | PRN

## 2015-02-13 MED ORDER — TRIHEXYPHENIDYL HCL 5 MG PO TABS
5.0000 mg | ORAL_TABLET | Freq: Two times a day (BID) | ORAL | Status: DC
  Administered 2015-02-14 – 2015-02-20 (×14): 5 mg via ORAL
  Filled 2015-02-13 (×20): qty 1

## 2015-02-13 MED ORDER — VANCOMYCIN HCL IN DEXTROSE 1-5 GM/200ML-% IV SOLN
1000.0000 mg | Freq: Two times a day (BID) | INTRAVENOUS | Status: DC
  Administered 2015-02-13 – 2015-02-17 (×8): 1000 mg via INTRAVENOUS
  Filled 2015-02-13 (×8): qty 200

## 2015-02-13 MED ORDER — SODIUM CHLORIDE 0.9 % IV BOLUS (SEPSIS): 1000.0000 mL | INTRAVENOUS | Status: AC

## 2015-02-13 MED ORDER — ALBUTEROL SULFATE (2.5 MG/3ML) 0.083% IN NEBU
2.5000 mg | INHALATION_SOLUTION | Freq: Once | RESPIRATORY_TRACT | Status: AC
  Administered 2015-02-13: 2.5 mg via RESPIRATORY_TRACT
  Filled 2015-02-13: qty 3

## 2015-02-13 MED ORDER — CLOZAPINE 100 MG PO TABS: 100.0000 mg | ORAL_TABLET | Freq: Three times a day (TID) | ORAL | Status: DC

## 2015-02-13 MED ORDER — LACTULOSE 10 GM/15ML PO SOLN
30.0000 g | Freq: Two times a day (BID) | ORAL | Status: DC
  Administered 2015-02-14 – 2015-02-20 (×13): 30 g via ORAL
  Filled 2015-02-13: qty 45
  Filled 2015-02-13 (×2): qty 60
  Filled 2015-02-13 (×3): qty 45
  Filled 2015-02-13 (×3): qty 60
  Filled 2015-02-13 (×2): qty 45
  Filled 2015-02-13 (×3): qty 60

## 2015-02-13 NOTE — ED Notes (Signed)
Tech drawing labs

## 2015-02-13 NOTE — ED Notes (Signed)
RT paged; RT states he will arrive in 15-20 minutes

## 2015-02-13 NOTE — H&P (Signed)
History and Physical  Patient Name: Craig Bentley     IDP:[id number]    DOB: 1955-12-25    DOA: 02/13/2015 Referring physician: Delsa Grana, PA-C PCP: Pcp Not In System      Chief Complaint: Cough  HPI: Craig Bentley is a 59 y.o. male with a past medical history significant for schizophrenia on clozapine, HTN, COPD, and NIDDM who presents with cough for four days.  The patient was in his usual state of health until about four days ago when he developed acute cough, weakness, falls, fever, and chest tightness.  This got worse until today, so he came to the ER.  In the ED, the patient was febrile to 101.20F, tachycardic, had leukocytosis and required supplemental oxygen by oxymask to bring his SpO2 to >90%.  [name] were asked to admit for sepsis from pneumonia.     Review of Systems:  All other systems negative except as just noted or noted in the history of present illness.  No Known Allergies  Prior to Admission medications   Medication Sig Start Date End Date Taking? Authorizing Provider  albuterol (PROVENTIL HFA;VENTOLIN HFA) 108 (90 BASE) MCG/ACT inhaler Inhale 2 puffs into the lungs every 4 (four) hours as needed for wheezing or shortness of breath.    Yes Historical Provider, MD  cloZAPine (CLOZARIL) 100 MG tablet Take 100-400 mg by mouth 3 (three) times daily. Takes 1 tablet every morning and afternoon Takes 4 tablets at bedtime   Yes Historical Provider, MD  dicyclomine (BENTYL) 20 MG tablet Take 20 mg by mouth 2 (two) times daily as needed for spasms.   Yes Historical Provider, MD  donepezil (ARICEPT) 5 MG tablet Take 5 mg by mouth at bedtime.   Yes Historical Provider, MD  gabapentin (NEURONTIN) 300 MG capsule Take 300 mg by mouth 3 (three) times daily.   Yes Historical Provider, MD  haloperidol (HALDOL) 10 MG tablet Take 10 mg by mouth at bedtime.   Yes Historical Provider, MD  hydrochlorothiazide (HYDRODIURIL) 25 MG tablet Take 25 mg by mouth daily.   Yes Historical Provider, MD   lactulose (CHRONULAC) 10 GM/15ML solution Take 30 g by mouth 2 (two) times daily.   Yes Historical Provider, MD  lisinopril (PRINIVIL,ZESTRIL) 20 MG tablet Take 20 mg by mouth daily.    Yes Historical Provider, MD  metFORMIN (GLUCOPHAGE) 500 MG tablet Take 500 mg by mouth 2 (two) times daily with a meal.   Yes Historical Provider, MD  metoprolol tartrate (LOPRESSOR) 25 MG tablet Take 25 mg by mouth 2 (two) times daily.   Yes Historical Provider, MD  Multiple Vitamin (MULITIVITAMIN WITH MINERALS) TABS Take 1 tablet by mouth daily.   Yes Historical Provider, MD  polyethylene glycol (MIRALAX / GLYCOLAX) packet Take 17 g by mouth daily as needed for mild constipation or moderate constipation.   Yes Historical Provider, MD  senna (SENOKOT) 8.6 MG TABS Take 1 tablet by mouth 2 (two) times daily.   Yes Historical Provider, MD  simvastatin (ZOCOR) 5 MG tablet Take 5 mg by mouth at bedtime.   Yes Historical Provider, MD  trihexyphenidyl (ARTANE) 5 MG tablet Take 5 mg by mouth 2 (two) times daily with a meal.   Yes Historical Provider, MD  acetaminophen (TYLENOL) 325 MG tablet Take 650 mg by mouth every 6 (six) hours as needed for mild pain, fever or headache.     Historical Provider, MD  guaifenesin (ROBITUSSIN) 100 MG/5ML syrup Take 200 mg by mouth 4 (four) times daily  as needed for cough.    Historical Provider, MD    Past Medical History  Diagnosis Date  . Hypertension   . Diabetes mellitus   . Schizophrenia, schizo-affective ([place])   . COPD (chronic obstructive pulmonary disease) ([place])   . Constipation   . Fecal impaction ([place])     History reviewed. No pertinent past surgical history.  Family history: family history includes Mental illness in his mother.  Social History: Patient lives in an assisted living or SNF, he is unable to say during my interview.  He is an active smoker.  He walks without a walker.       Physical Exam: BP 103/48 mmHg  Pulse 94  Temp(Src) 99.8 F (37.7 C)  (Rectal)  Resp 19  Ht '5\' 7"'$  (1.702 m)  Wt 83.008 kg (183 lb)  BMI 28.66 kg/m2  SpO2 95% General appearance: Well-developed, adult male, sluggish and drifts off to sleep.  On oxymask. Eyes: Anicteric, conjunctiva pink, lids and lashes normal.     ENT: No nasal deformity, discharge, or epistaxis.  OP moist without lesions.  Drooling. Lymph: No cervical, supraclavicular lymphadenopathy. Skin: Warm and dry.  Cap refill normal. Cardiac: Tachy regular, nl S1-S2, no murmurs appreciated.   Respiratory: Normal respiratory rate and rhythm on oxymask.  Coarse and diminished breath sounds bilaterally. Abdomen: Abdomen soft without rigidity.  No TTP. No ascites, distension.   MSK: No deformities or effusions. Neuro: Sluggish, but able to respond to questions.  Drifts back off to sleep.  Moves all extremities equally and with normal coordination.   Cranial nerves normal. Psych: Behavior appropriate.  Affect blunted..  No evidence of aural or visual hallucinations or delusions.       Labs on Admission:  The metabolic panel shows elevated BUN to creatinine ratio, normal renal function. ABG shows pO2 56 BNP normal Lactic acid normal TNI normal The complete blood count shows leukocytosis to 12K/uL.  No anemia.   Radiological Exams on Admission: Personally reviewed: Dg Chest 2 View  02/13/2015  CLINICAL DATA:  59 year old male with productive cough and fever EXAM: CHEST  2 VIEW COMPARISON:  Radiograph dated 09/16/2013 FINDINGS: There is a focal area of slight increased vascular crowding in the right lower lung field, likely atelectatic changes. Developing pneumonia is less likely. Clinical correlation and follow-up recommended. There is no focal consolidation, pleural effusion, or pneumothorax. The cardiac silhouette is within normal limits. The osseous structures appear unremarkable. IMPRESSION: No focal consolidation. Electronically Signed   By: Anner Crete M.D.   On: 02/13/2015 19:12    EKG:  Independently reviewed. Rate 97, sinus tachycardia, without ST changes.    Assessment/Plan 1. Sepsis from pneumonia with acute hypoxic respiratory failure:  This is new.  Suspected source pneumonia. Organism unknown. Patient meets criteria given tachycardia, fever, leukocytosis, and evidence of organ dysfunction (lung).  Blood and urine cultures drawn.  Lactate normal and so repeat not ordered.  MAP > 65 mmHg. -Continue vancomycin and cefepime, given lives in facility and will cover for HCAP -Check sputum and gram stain -Follow blood and urine cultures -Sepsis bundle:  -30 ml/kg bolus given in ED  -Telemetry  -Vital signs every one hour for the first 4 hours  -Acetaminophen for fever -Albuterol PRN  2. Schizophrenia:  WBC slightly elevated. -Continue clozapine -Continue Artane -Continue haloperidol -Continue donepezil  3. Smoking:  Unable to recommend smoking cessation due to acuity -Nursing smoking cessation teaching before discharge  4. Neuropathy:  -Continue gabapentin  5. HTN Stable.  Normotensive at admission -Continue HCTZ and metoprolol -Hold lisinopril until fluid resuscitated  6. NIDDM Stable.  -Sliding scale corrections -Hold metformin while sepsis  7. Constipation Stable.  -Contineu lactulose     DVT PPx: Lovenox Diet: Diabetic Consultants: None Code Status: Full Family Communication: None Medical decision making: What exists of the patient's previous chart was reviewed in depth and the case was discussed with Delsa Grana, PA-C Patient seen 10:14 PM on 02/13/2015.  Disposition Plan:  Admit to inpatient SDU for sepsis with pneumonia.        Edwin Dada [facility] Pager [phone]

## 2015-02-13 NOTE — ED Provider Notes (Signed)
Craig Bentley is a 59 y.o. male    Face-to-face evaluation   History: He is here for ongoing cough for 1 month. He also complains of pain in the toes of his right foot. He denies fever, vomiting or chest pain.  Physical exam: Alert, elderly man who is moderately uncomfortable. No increased work of breathing. He is somewhat somnolent, but is arousable to voice. Lungs with decreased air movement bilaterally and scattered end-expiratory wheezing.  Medical screening examination/treatment/procedure(s) were conducted as a shared visit with non-physician practitioner(s) and myself.  I personally evaluated the patient during the encounter  Daleen Bo, MD 02/14/15 2333

## 2015-02-13 NOTE — ED Notes (Addendum)
Pt BIB [facility] for c/o cough x1 month; pt states he has been "getting everyone sick" with his cough and requested to wear a face mask; pt febrile at 101.1; pt coughing in triage but NAD noted; pt is diabetic and states he "doesn't know" the last time he took his metformin; CBG 139.

## 2015-02-13 NOTE — ED Provider Notes (Signed)
CSN: [id number]     Arrival date & time 02/13/15  1709 History   First MD Initiated Contact with Patient 02/13/15 1716     Chief Complaint  Patient presents with  . Cough     (Consider location/radiation/quality/duration/timing/severity/associated sxs/prior Treatment) HPI   Earmon Sherrow is a is a 59 year old male with history of COPD, hypertension, diabetes and schizophrenia, he lives in assisted nursing facility, he presents to the ER for evaluation of nonproductive cough and shortness of breath with central, substernal chest pain, worse with inspiration, for the past 4 days.  He confirms he is a current smoker with a 50 to 75-pack-year history.  He did not know what COPD was.  He denies home O2 or ever being put on BiPAP.  He denies any breathing treatments or inhalers available to him at home.  At triage he noted a cough for over a month. When I initially asked him how he was feeling he also said he had been coughing for a month and half. Later he said his foot had been hurting for month and half and he had only had a cough for 4 days.  The patient denies any diaphoresis, lower extremity edema, orthopnea, PND.   He is only able to speak in 1-2 word answers, otherwise is a poor historian, and thus history is limited by his condition and by psychosocial factors - level V caveat.  Past Medical History  Diagnosis Date  . Hypertension   . Diabetes mellitus   . Schizophrenia, schizo-affective ([place])   . COPD (chronic obstructive pulmonary disease) ([place])   . Constipation   . Fecal impaction ([place])    History reviewed. No pertinent past surgical history. Family History  Problem Relation Age of Onset  . Mental illness Mother    Social History  Substance Use Topics  . Smoking status: Current Every Day Smoker -- 1.50 packs/day    Types: Cigarettes  . Smokeless tobacco: None  . Alcohol Use: No    Review of Systems  Unable to perform ROS: Other (respiratory distress)  Constitutional:  Positive for diaphoresis.  Respiratory: Positive for cough, chest tightness, shortness of breath and wheezing. Negative for apnea.   Cardiovascular: Positive for chest pain. Negative for palpitations and leg swelling.  Gastrointestinal: Negative.   Musculoskeletal: Positive for arthralgias (left foot pain and pain in all toes for 1.5 months).  Skin: Negative.   Neurological: Positive for weakness.    Allergies  Review of patient's allergies indicates no known allergies.  Home Medications   Prior to Admission medications   Medication Sig Start Date End Date Taking? Authorizing Provider  albuterol (PROVENTIL HFA;VENTOLIN HFA) 108 (90 BASE) MCG/ACT inhaler Inhale 2 puffs into the lungs every 4 (four) hours as needed for wheezing or shortness of breath.    Yes Historical Provider, MD  cloZAPine (CLOZARIL) 100 MG tablet Take 100-400 mg by mouth 3 (three) times daily. Takes 1 tablet every morning and afternoon Takes 4 tablets at bedtime   Yes Historical Provider, MD  dicyclomine (BENTYL) 20 MG tablet Take 20 mg by mouth 2 (two) times daily as needed for spasms.   Yes Historical Provider, MD  donepezil (ARICEPT) 5 MG tablet Take 5 mg by mouth at bedtime.   Yes Historical Provider, MD  gabapentin (NEURONTIN) 300 MG capsule Take 300 mg by mouth 3 (three) times daily.   Yes Historical Provider, MD  haloperidol (HALDOL) 10 MG tablet Take 10 mg by mouth at bedtime.   Yes Historical Provider,  MD  hydrochlorothiazide (HYDRODIURIL) 25 MG tablet Take 25 mg by mouth daily.   Yes Historical Provider, MD  lactulose (CHRONULAC) 10 GM/15ML solution Take 30 g by mouth 2 (two) times daily.   Yes Historical Provider, MD  lisinopril (PRINIVIL,ZESTRIL) 20 MG tablet Take 20 mg by mouth daily.    Yes Historical Provider, MD  metFORMIN (GLUCOPHAGE) 500 MG tablet Take 500 mg by mouth 2 (two) times daily with a meal.   Yes Historical Provider, MD  metoprolol tartrate (LOPRESSOR) 25 MG tablet Take 25 mg by mouth 2 (two)  times daily.   Yes Historical Provider, MD  Multiple Vitamin (MULITIVITAMIN WITH MINERALS) TABS Take 1 tablet by mouth daily.   Yes Historical Provider, MD  polyethylene glycol (MIRALAX / GLYCOLAX) packet Take 17 g by mouth daily as needed for mild constipation or moderate constipation.   Yes Historical Provider, MD  senna (SENOKOT) 8.6 MG TABS Take 1 tablet by mouth 2 (two) times daily.   Yes Historical Provider, MD  simvastatin (ZOCOR) 5 MG tablet Take 5 mg by mouth at bedtime.   Yes Historical Provider, MD  trihexyphenidyl (ARTANE) 5 MG tablet Take 5 mg by mouth 2 (two) times daily with a meal.   Yes Historical Provider, MD  acetaminophen (TYLENOL) 325 MG tablet Take 650 mg by mouth every 6 (six) hours as needed for mild pain, fever or headache.     Historical Provider, MD  guaifenesin (ROBITUSSIN) 100 MG/5ML syrup Take 200 mg by mouth 4 (four) times daily as needed for cough.    Historical Provider, MD   BP 110/45 mmHg  Pulse 78  Temp(Src) 98.4 F (36.9 C) (Oral)  Resp 22  Ht 6' (1.829 m)  Wt 82.7 kg  BMI 24.72 kg/m2  SpO2 94% Physical Exam  Constitutional: He is oriented to person, place, and time. He appears well-developed and well-nourished. No distress.  Obese male, appears older than stated age, appears ill, but non-toxic, looks sleepy at times, but answering questions in 1-2 word, A&O to person, place  HENT:  Head: Normocephalic and atraumatic.  Nose: Nose normal.  Mouth/Throat: No oropharyngeal exudate.  Oral mucosa dry  Eyes: Conjunctivae and EOM are normal. Pupils are equal, round, and reactive to light. Right eye exhibits no discharge. Left eye exhibits no discharge. No scleral icterus.  Neck: Normal range of motion. No JVD present. No tracheal deviation present. No thyromegaly present.  Cardiovascular: Normal rate, regular rhythm, normal heart sounds and intact distal pulses.  Exam reveals no gallop and no friction rub.   No murmur heard. Pulmonary/Chest: Accessory muscle  usage present. Tachypnea noted. He is in respiratory distress. He has decreased breath sounds. He has wheezes. He has rhonchi. He has no rales. He exhibits no tenderness.  Breath sounds decreased throughout with scattered rhonchi and expiratory wheeze, frequent wet-sounding, but non-productive cough Tachypneic with abdominal accessory muscle use  Abdominal: Soft. Bowel sounds are normal. He exhibits no distension and no mass. There is no tenderness. There is no rebound and no guarding.  Obese, soft, non-tender, non-distended  Musculoskeletal: Normal range of motion. He exhibits no edema or tenderness.  Lymphadenopathy:    He has no cervical adenopathy.  Neurological: He is alert and oriented to person, place, and time. He has normal reflexes. No cranial nerve deficit. He exhibits normal muscle tone. Coordination normal.  Patient is oriented to person, place and day, he is intermittently sleepy but responds to verbal stimuli  Skin: Skin is warm. No rash noted.  He is diaphoretic. No cyanosis or erythema. No pallor. Nails show clubbing.  Psychiatric: He has a normal mood and affect. His behavior is normal. Judgment and thought content normal.  Nursing note and vitals reviewed.   ED Course  Procedures (including critical care time) Labs Review Labs Reviewed  BASIC METABOLIC PANEL - Abnormal; Notable for the following:    Chloride 95 (*)    Glucose, Bld 144 (*)    BUN 25 (*)    All other components within normal limits  CBC WITH DIFFERENTIAL/PLATELET - Abnormal; Notable for the following:    WBC 12.0 (*)    Hemoglobin 12.2 (*)    HCT 38.3 (*)    Neutro Abs 8.2 (*)    All other components within normal limits  BLOOD GAS, ARTERIAL - Abnormal; Notable for the following:    pCO2 arterial 45.5 (*)    pO2, Arterial 56.1 (*)    Bicarbonate 26.5 (*)    All other components within normal limits  CBC - Abnormal; Notable for the following:    WBC 11.2 (*)    RBC 3.76 (*)    Hemoglobin 10.4 (*)     HCT 32.8 (*)    All other components within normal limits  GLUCOSE, CAPILLARY - Abnormal; Notable for the following:    Glucose-Capillary 219 (*)    All other components within normal limits  CBG MONITORING, ED - Abnormal; Notable for the following:    Glucose-Capillary 139 (*)    All other components within normal limits  CBG MONITORING, ED - Abnormal; Notable for the following:    Glucose-Capillary 130 (*)    All other components within normal limits  MRSA PCR SCREENING  CULTURE, BLOOD (ROUTINE X 2)  CULTURE, BLOOD (ROUTINE X 2)  URINE CULTURE  CULTURE, EXPECTORATED SPUTUM-ASSESSMENT  GRAM STAIN  BRAIN NATRIURETIC PEPTIDE  URINALYSIS, ROUTINE W REFLEX MICROSCOPIC (NOT AT [facility])  INFLUENZA PANEL BY PCR (TYPE A & B, H1N1)  LEGIONELLA ANTIGEN, URINE  STREP PNEUMONIAE URINARY ANTIGEN  CREATININE, SERUM  BASIC METABOLIC PANEL  CBC  I-STAT TROPOININ, ED  I-STAT CG4 LACTIC ACID, ED  I-STAT CG4 LACTIC ACID, ED    Imaging Review Dg Chest 2 View  02/13/2015  CLINICAL DATA:  59 year old male with productive cough and fever EXAM: CHEST  2 VIEW COMPARISON:  Radiograph dated 09/16/2013 FINDINGS: There is a focal area of slight increased vascular crowding in the right lower lung field, likely atelectatic changes. Developing pneumonia is less likely. Clinical correlation and follow-up recommended. There is no focal consolidation, pleural effusion, or pneumothorax. The cardiac silhouette is within normal limits. The osseous structures appear unremarkable. IMPRESSION: No focal consolidation. Electronically Signed   By: Anner Crete M.D.   On: 02/13/2015 19:12   I have personally reviewed and evaluated these images and lab results as part of my medical decision-making.   EKG Interpretation   Date/Time:  Thursday February 13 2015 18:08:32 EST Ventricular Rate:  97 PR Interval:  154 QRS Duration: 87 QT Interval:  355 QTC Calculation: 451 R Axis:   62 Text Interpretation:  Sinus  rhythm Artifact since last tracing no  significant change Confirmed by Eulis Foster  MD, Vira Agar ([id number]) on 02/13/2015  7:49:44 PM      MDM   Patient with cough Patient was febrile at initial presentation with frequent cough Workup for acute exacerbation of COPD versus HCAP Despite multiple breathing treatments and CAT, pt progressively required more oxygen supplementation. He was dyspneic, persistently tachypnic and developed  a soft blood pressure, SBP 92 (staff asked to repeat with manual cuff)  Re-examination the patient's lungs after multiple breathing tx and CAT revealed persistent rhonchi, diffuse exp wheeze, frequent cough, tachypnea, with increased WOB.  Pt is intermittently very sleeping appearing, but then will answer questions and has requested several times to eat a "bologna sandwich."  His clinical picture continues to vary, initially I have low concern for hypercapnia, but the patient continued to have increased work of breathing and appeared progressively fatigued, with persistent diaphoresis. Initially patient's pulse ox was 90% on room air, he did require 2 L to maintain sats at 93%.  The patient became more sleepy and would desat into the 80s, repeatedly having woken up and encouraged to take deep breaths.   Dr. Eulis Foster was alerted to pt's presentation after initial eval, also updated after pt required higher O2 supplementation.  He has personally seen and evaluated the pt, is in agreement with work-up thus far, and has added ABG order.  ABG was obtained which reveals pH WNL, low PO2, 56.1 while on 5L O2, mildly elevated PCO2 45.5 and mildly elevated bicarb 26.5 Otherwise patient has negative troponin, negative BNP, lactic acid of 0.91, leukocytosis of 12.0 Chest x-ray suggestive of slight increased vascular crowding and right lower lung field, atelectasis versus early pneumonia, and otherwise negative for any other focal consolidation, pleural effusion or pneumothorax  The patient  had blood cultures obtained with initial labs.  PCR resp panel, sputum culture, and urine antigens for strep/legionella added for future hospital course.  Pt had 30 mL/kg IVF ordered, and Abx for HCAP ordered via pharmacy consult.  Respiratory has assisted throughout the pt ER workup, last placed on ventimask with 4 L/m with oxygen saturation 90-95%.  Pt will be admitted to stepdown for respiratory distress/acute respiratory failure, HCAP and sepsis by Dr. Myrene Buddy.  Final diagnoses:  Respiratory distress   CRITICAL CARE Performed by: Delsa Grana Total critical care time: 75 Critical care time was exclusive of separately billable procedures and treating other patients. Critical care was necessary to treat or prevent imminent or life-threatening deterioration. Critical care was time spent personally by me on the following activities: development of treatment plan with patient and/or surrogate as well as nursing, discussions with consultants, evaluation of patient's response to treatment, examination of patient, obtaining history from patient or surrogate, ordering and performing treatments and interventions, ordering and review of laboratory studies, ordering and review of radiographic studies, pulse oximetry and re-evaluation of patient's condition.       Delsa Grana, PA-C 02/14/15 [place], MD 02/14/15 [phone]

## 2015-02-13 NOTE — ED Notes (Signed)
PT transported to [facility]

## 2015-02-13 NOTE — Progress Notes (Signed)
ANTIBIOTIC CONSULT NOTE - INITIAL  Pharmacy Consult for vancomycin and cefepime Indication: pneumonia  No Known Allergies  Patient Measurements: Height: '5\' 7"'$  (170.2 cm) Weight: 183 lb (83.008 kg) IBW/kg (Calculated) : 66.1   Vital Signs: Temp: 99.8 F (37.7 C) (12/22 2107) Temp Source: Rectal (12/22 2107) BP: 92/58 mmHg (12/22 2051) Pulse Rate: 94 (12/22 2115) Intake/Output from previous day:   Intake/Output from this shift:    Labs:  Recent Labs  02/13/15 1736  WBC 12.0*  HGB 12.2*  PLT 190  CREATININE 1.16   Estimated Creatinine Clearance: 69.8 mL/min (by C-G formula based on Cr of 1.16). No results for input(s): VANCOTROUGH, VANCOPEAK, VANCORANDOM, GENTTROUGH, GENTPEAK, GENTRANDOM, TOBRATROUGH, TOBRAPEAK, TOBRARND, AMIKACINPEAK, AMIKACINTROU, AMIKACIN in the last 72 hours.   Microbiology: No results found for this or any previous visit (from the past 720 hour(s)).  Medical History: Past Medical History  Diagnosis Date  . Hypertension   . Diabetes mellitus   . Schizophrenia, schizo-affective ([place])   . COPD (chronic obstructive pulmonary disease) ([place])   . Constipation   . Fecal impaction [facility])      Assessment: 59 year old male with history of COPD, hypertension, diabetes and schizophrenia, he lives in assisted nursing facility, he presents to the ER for evaluation of nonproductive cough and shortness of breath with central, substernal chest pain, worse with inspiration, for the past 4 days. He confirms he is a current smoker with a 50 to 75-pack-year history  Goal of Therapy:  Vancomycin trough level 15-20 mcg/ml Cefepime per renal function  Plan:  Cefepime 1gm IV q8h  Vancomycin 1g IV q12h Follow cultures, renal function, clinical course vanc trough as needed  Dolly Rias RPh 02/13/2015, 9:21 PM Pager [phone]

## 2015-02-14 ENCOUNTER — Inpatient Hospital Stay (HOSPITAL_COMMUNITY): Payer: Medicaid Other

## 2015-02-14 DIAGNOSIS — I10 Essential (primary) hypertension: Secondary | ICD-10-CM

## 2015-02-14 DIAGNOSIS — J9601 Acute respiratory failure with hypoxia: Secondary | ICD-10-CM

## 2015-02-14 DIAGNOSIS — J189 Pneumonia, unspecified organism: Secondary | ICD-10-CM | POA: Diagnosis present

## 2015-02-14 DIAGNOSIS — F203 Undifferentiated schizophrenia: Secondary | ICD-10-CM

## 2015-02-14 DIAGNOSIS — A419 Sepsis, unspecified organism: Principal | ICD-10-CM

## 2015-02-14 LAB — BASIC METABOLIC PANEL
Anion gap: 11 (ref 5–15)
BUN: 28 mg/dL — AB (ref 6–20)
CHLORIDE: 100 mmol/L — AB (ref 101–111)
CO2: 25 mmol/L (ref 22–32)
CREATININE: 1.18 mg/dL (ref 0.61–1.24)
Calcium: 8.7 mg/dL — ABNORMAL LOW (ref 8.9–10.3)
GFR calc Af Amer: >60 mL/min (ref >60)
GFR calc non Af Amer: >60 mL/min (ref >60)
Glucose, Bld: 294 mg/dL — ABNORMAL HIGH (ref 65–99)
POTASSIUM: 4.5 mmol/L (ref 3.5–5.1)
SODIUM: 136 mmol/L (ref 135–145)

## 2015-02-14 LAB — INFLUENZA PANEL BY PCR (TYPE A & B)
H1N1 flu by pcr: NOT DETECTED
Influenza A By PCR: NEGATIVE
Influenza B By PCR: NEGATIVE

## 2015-02-14 LAB — CBC
HEMATOCRIT: 34.9 % — AB (ref 39.0–52.0)
Hemoglobin: 10.9 g/dL — ABNORMAL LOW (ref 13.0–17.0)
MCH: 27 pg (ref 26.0–34.0)
MCHC: 31.2 g/dL (ref 30.0–36.0)
MCV: 86.4 fL (ref 78.0–100.0)
PLATELETS: 181 10*3/uL (ref 150–400)
RBC: 4.04 MIL/uL — ABNORMAL LOW (ref 4.22–5.81)
RDW: 13.1 % (ref 11.5–15.5)
WBC: 10 10*3/uL (ref 4.0–10.5)

## 2015-02-14 LAB — URINALYSIS, ROUTINE W REFLEX MICROSCOPIC
Bilirubin Urine: NEGATIVE
Glucose, UA: NEGATIVE mg/dL
Hgb urine dipstick: NEGATIVE
KETONES UR: NEGATIVE mg/dL
Leukocytes, UA: NEGATIVE
Nitrite: NEGATIVE
PH: 5.5 (ref 5.0–8.0)
Protein, ur: NEGATIVE mg/dL
Specific Gravity, Urine: 1.012 (ref 1.005–1.030)

## 2015-02-14 LAB — GLUCOSE, CAPILLARY
GLUCOSE-CAPILLARY: 231 mg/dL — AB (ref 65–99)
GLUCOSE-CAPILLARY: 230 mg/dL — AB (ref 65–99)
GLUCOSE-CAPILLARY: 129 mg/dL — AB (ref 65–99)
Glucose-Capillary: 226 mg/dL — ABNORMAL HIGH (ref 65–99)
Glucose-Capillary: 236 mg/dL — ABNORMAL HIGH (ref 65–99)

## 2015-02-14 LAB — STREP PNEUMONIAE URINARY ANTIGEN: Strep Pneumo Urinary Antigen: NEGATIVE

## 2015-02-14 LAB — MRSA PCR SCREENING: MRSA by PCR: NEGATIVE

## 2015-02-14 MED ORDER — SENNOSIDES-DOCUSATE SODIUM 8.6-50 MG PO TABS
1.0000 | ORAL_TABLET | Freq: Two times a day (BID) | ORAL | Status: DC
  Administered 2015-02-14 – 2015-02-20 (×11): 1 via ORAL
  Filled 2015-02-14 (×12): qty 1

## 2015-02-14 MED ORDER — METHYLPREDNISOLONE SODIUM SUCC 40 MG IJ SOLR
40.0000 mg | Freq: Every day | INTRAMUSCULAR | Status: DC
  Administered 2015-02-14 – 2015-02-17 (×4): 40 mg via INTRAVENOUS
  Filled 2015-02-14 (×4): qty 1

## 2015-02-14 MED ORDER — CLOZAPINE 100 MG PO TABS
400.0000 mg | ORAL_TABLET | Freq: Every day | ORAL | Status: DC
  Administered 2015-02-14 – 2015-02-19 (×6): 400 mg via ORAL
  Filled 2015-02-14 (×9): qty 4

## 2015-02-14 MED ORDER — IPRATROPIUM-ALBUTEROL 0.5-2.5 (3) MG/3ML IN SOLN
3.0000 mL | Freq: Four times a day (QID) | RESPIRATORY_TRACT | Status: DC
  Administered 2015-02-14 – 2015-02-16 (×7): 3 mL via RESPIRATORY_TRACT
  Filled 2015-02-14 (×7): qty 3

## 2015-02-14 MED ORDER — BISACODYL 10 MG RE SUPP
10.0000 mg | Freq: Once | RECTAL | Status: AC
  Administered 2015-02-14: 10 mg via RECTAL
  Filled 2015-02-14: qty 1

## 2015-02-14 MED ORDER — CLOZAPINE 100 MG PO TABS
100.0000 mg | ORAL_TABLET | Freq: Two times a day (BID) | ORAL | Status: DC
  Administered 2015-02-14 – 2015-02-20 (×14): 100 mg via ORAL
  Filled 2015-02-14 (×17): qty 1

## 2015-02-14 MED ORDER — PROMETHAZINE HCL 25 MG/ML IJ SOLN: 12.5000 mg | Freq: Four times a day (QID) | INTRAMUSCULAR | Status: DC | PRN

## 2015-02-14 NOTE — Care Management Note (Signed)
Case Management Note  Patient Details  Name: Craig Bentley MRN: [id number] Date of Birth: Feb 15, 1956  Subjective/Objective:     hcap and sepsis                 Action/Plan: Date: February 14, 2015 Chart reviewed for concurrent status and case management needs. Will continue to follow patient for changes and needs: [name], RN, BSN, [place]   [phone]  Expected Discharge Date:                  Expected Discharge Plan:  Skilled Nursing Facility  In-House Referral:  Clinical Social Work  Discharge planning Services  CM Consult  Post Acute Care Choice:  NA Choice offered to:  NA  DME Arranged:    DME Agency:     HH Arranged:    [place] Agency:     Status of Service:  Completed, signed off  Medicare Important Message Given:    Date Medicare IM Given:    Medicare IM give by:    Date Additional Medicare IM Given:    Additional Medicare Important Message give by:     If discussed at [place] of Stay Meetings, dates discussed:    Additional Comments:  Leeroy Cha, RN 02/14/2015, 10:22 AM

## 2015-02-14 NOTE — Progress Notes (Addendum)
Patient ID: Craig Bentley, male   DOB: 1955/03/15, 59 y.o.   MRN: [id number]  [facility] PROGRESS NOTE  Craig Bentley LTJ:[id number] DOB: 1955/10/12 DOA: 02/13/2015 PCP: Pcp Not In System   Brief narrative:    59 y.o. male with known chizophrenia on clozapine, HTN, COPD, and NIDDM who presents with cough for four days associated with weakness, falls, fever, and chest tightness.  In the ED, the patient was febrile with T 101.69F, tachycardic, had leukocytosis and required supplemental oxygen by oxymask to bring his SpO2 to >90%. [name] were asked to admit for sepsis from pneumonia.  Assessment/Plan:    Principal Problem:   Acute respiratory failure with hypoxia (HCC) - Suspect this is related to pneumonia, viral versus bacterial, imposed on acute COPD exacerbation - Continue broad-spectrum antibiotics for now vancomycin and cefepime day #2 - Follow-up on sputum culture urine will Legionella and strep pneumo - Narrow down antibiotics as clinically indicated - Also treat acute COPD exacerbation with Solu-Medrol IV 40 mg daily, bronchodilators scheduled and as needed - Keep on oxygen to maintain saturations above 92%  Active Problems:   Sepsis due to pneumonia [facility]) - Patient met criteria for sepsis on admission, suspect pneumonia as a source but not evident on chest x-ray - Physical exam is notable for left middle lung lobe dullness to percussion and bronchial breath sounds - We will request CT chest for clear evaluation - Continue current antibiotics as noted above    Acute COPD (chronic obstructive pulmonary disease) (HCC) - Significant expiratory wheezing noted specifically in metal and upper lobes mostly on the left side - Placed on Solu-Medrol, bronchodilators scheduled and as needed    Diabetes mellitus with neurologic complication, without long-term current use of insulin ([place]) - Continue with sliding scale insulin while inpatient - Continue gabapentin    Essential  hypertension - Continue home medical regimen - Continue HCTZ and metoprolol - Continue to hold lisinopril until fluid resuscitated  DVT prophylaxis - Lovenox SQ  Code Status: Full.  Family Communication:  plan of care discussed with the patient Disposition Plan: Not ready for discharge   IV access:  Peripheral IV  Procedures and diagnostic studies:    Dg Chest 2 View 02/13/2015 No focal consolidation.   Medical Consultants:  None   Other Consultants:  None   IAnti-Infectives:   Vancomycin 12/22  -> Cefepime 12/22 -->  [name], MD  [facility] Pager [phone]  If 7PM-7AM, please contact night-coverage [email or web address] Password [name] 02/14/2015, 9:02 AM   LOS: 1 day   HPI/Subjective: No events overnight. Still with dyspnea.   Objective: Filed Vitals:   02/14/15 0600 02/14/15 0800 02/14/15 0826 02/14/15 0848  BP: 100/41  150/61   Pulse: 73  78 71  Temp:  97.8 F (36.6 C)    TempSrc:  Oral    Resp: '23  18 22  ' Height:      Weight:      SpO2: 96%  97% 97%    Intake/Output Summary (Last 24 hours) at 02/14/15 0902 Last data filed at 02/14/15 0800  Gross per 24 hour  Intake   2250 ml  Output   1325 ml  Net    925 ml    Exam:   General:  Pt is alert, not in acute distress  Cardiovascular: Regular rate and rhythm, S1/S2, no murmurs, no rubs, no gallops  Respiratory: bronchial breath sounds with exp wheezing mostly in upper and middle lobes, L > R, dullness to percussion in the mid  left lobe   Abdomen: Soft, non tender, non distended, bowel sounds present, no guarding  Extremities: pulses DP and PT palpable bilaterally  Neuro: Grossly nonfocal  Data Reviewed: Basic Metabolic Panel:  Recent Labs Lab 02/13/15 1736 02/14/15 0421  NA 135 136  K 4.2 4.5  CL 95* 100*  CO2 29 25  GLUCOSE 144* 294*  BUN 25* 28*  CREATININE 1.16 1.18  CALCIUM 8.9 8.7*   CBC:  Recent Labs Lab 02/13/15 1736 02/13/15 2330 02/14/15 0421  WBC 12.0* 11.2* 10.0   NEUTROABS 8.2*  --   --   HGB 12.2* 10.4* 10.9*  HCT 38.3* 32.8* 34.9*  MCV 87.6 87.2 86.4  PLT 190 176 181   CBG:  Recent Labs Lab 02/13/15 1720 02/13/15 1737 02/13/15 2359 02/14/15 0612 02/14/15 0753  GLUCAP 139* 130* 219* 226* 231*    Recent Results (from the past 240 hour(s))  MRSA PCR Screening     Status: None   Collection Time: 02/13/15 11:37 PM  Result Value Ref Range Status   MRSA by PCR NEGATIVE NEGATIVE Final    Scheduled Meds: . ceFEPime IV  1 g Intravenous 3 times per day  . cloZAPine  100 mg Oral BID WC  . cloZAPine  400 mg Oral QHS  . donepezil  5 mg Oral QHS  . enoxaparin  injection  40 mg Subcutaneous QHS  . gabapentin  300 mg Oral TID  . haloperidol  10 mg Oral QHS  . hydrochlorothiazide  25 mg Oral Daily  . insulin aspart  0-5 Units Subcutaneous QHS  . insulin aspart  0-9 Units Subcutaneous TID WC  . lactulose  30 g Oral BID  . metoprolol tartrate  25 mg Oral BID  . senna  1 tablet Oral BID  . simvastatin  5 mg Oral QHS  . trihexyphenidyl  5 mg Oral BID WC  . vancomycin  1,000 mg Intravenous Q12H   Continuous Infusions: . sodium chloride 125 mL/hr at 02/13/15 2300

## 2015-02-14 NOTE — Progress Notes (Signed)
Sepsis - Repeat Assessment  Performed at:    0330  Vitals     Blood pressure 110/45, pulse 78, temperature 98.4 F (36.9 C), temperature source Oral, resp. rate 22, height 6' (1.829 m), weight 82.7 kg (182 lb 5.1 oz), SpO2 94 %.  Heart:     Regular rate and rhythm Lungs:    Rhonchi Capillary Refill:   <2 sec Peripheral Pulse:   Radial pulse palpable Skin:     Normal Color and warm  Antibiotics to continue.  Patient stabilized.

## 2015-02-14 NOTE — Progress Notes (Signed)
Patient has been unable to produce a sputum sample.

## 2015-02-14 NOTE — Progress Notes (Addendum)
CRITICAL VALUE ALERT  Critical value received:  Aerobic gram positive cocci in clusters  Date of notification:  02-14-15  Time of notification:  1626  Critical value read back:  yes  Nurse who received alert:  Jerrel Ivory, RN  MD notified (1st page):  Dr. Rolly Pancake  Time of first page:  1631  MD notified (2nd page):  Time of second page:  Responding MD:  Dr. Rolly Pancake  Time MD responded:  [phone]

## 2015-02-15 DIAGNOSIS — J440 Chronic obstructive pulmonary disease with acute lower respiratory infection: Secondary | ICD-10-CM

## 2015-02-15 DIAGNOSIS — R0603 Acute respiratory distress: Secondary | ICD-10-CM | POA: Insufficient documentation

## 2015-02-15 DIAGNOSIS — E0840 Diabetes mellitus due to underlying condition with diabetic neuropathy, unspecified: Secondary | ICD-10-CM

## 2015-02-15 DIAGNOSIS — R0600 Dyspnea, unspecified: Secondary | ICD-10-CM | POA: Insufficient documentation

## 2015-02-15 LAB — GLUCOSE, CAPILLARY
GLUCOSE-CAPILLARY: 147 mg/dL — AB (ref 65–99)
Glucose-Capillary: 159 mg/dL — ABNORMAL HIGH (ref 65–99)
Glucose-Capillary: 256 mg/dL — ABNORMAL HIGH (ref 65–99)

## 2015-02-15 LAB — URINE CULTURE

## 2015-02-15 LAB — CBC
HEMATOCRIT: 34.4 % — AB (ref 39.0–52.0)
HEMOGLOBIN: 10.8 g/dL — AB (ref 13.0–17.0)
MCH: 27.3 pg (ref 26.0–34.0)
MCHC: 31.4 g/dL (ref 30.0–36.0)
MCV: 87.1 fL (ref 78.0–100.0)
Platelets: 199 10*3/uL (ref 150–400)
RBC: 3.95 MIL/uL — AB (ref 4.22–5.81)
RDW: 12.8 % (ref 11.5–15.5)
WBC: 17.4 10*3/uL — ABNORMAL HIGH (ref 4.0–10.5)

## 2015-02-15 LAB — BASIC METABOLIC PANEL
ANION GAP: 7 (ref 5–15)
BUN: 20 mg/dL (ref 6–20)
CHLORIDE: 103 mmol/L (ref 101–111)
CO2: 28 mmol/L (ref 22–32)
Calcium: 8.8 mg/dL — ABNORMAL LOW (ref 8.9–10.3)
Creatinine, Ser: 0.76 mg/dL (ref 0.61–1.24)
GFR calc non Af Amer: >60 mL/min (ref >60)
Glucose, Bld: 156 mg/dL — ABNORMAL HIGH (ref 65–99)
POTASSIUM: 4.9 mmol/L (ref 3.5–5.1)
Sodium: 138 mmol/L (ref 135–145)

## 2015-02-15 MED ORDER — HYDRALAZINE HCL 20 MG/ML IJ SOLN: 5.0000 mg | INTRAMUSCULAR | Status: DC | PRN

## 2015-02-15 NOTE — Plan of Care (Signed)
Problem: ICU Phase Progression Outcomes Goal: Voiding-avoid urinary catheter unless indicated Outcome: Completed/Met Date Met:  02/15/15 Patient has a condom catheter in place; however, Patient is voiding without the use of a urinary (Foley) catheter.     

## 2015-02-15 NOTE — Evaluation (Signed)
Physical Therapy Evaluation Patient Details Name: Craig Bentley MRN: [id number] DOB: [birth year] Today's Date: 02/15/2015   History of Present Illness  59 y.o. male with h/o COPD, HTN, schizophrenia,NIDDM admitted with PNA, sepsis.   Clinical Impression  Pt admitted with above diagnosis. Pt currently with functional limitations due to the deficits listed below (see PT Problem List). Pt ambulated 140' with RW and 3L O2, SaO2 89% with walking, RR 29-35. Expect pt will be able to return to ALF, may need O2 for home depending on progress.  Pt will benefit from skilled PT to increase their independence and safety with mobility to allow discharge to the venue listed below.       Follow Up Recommendations No PT follow up    Equipment Recommendations  Rolling walker with 5" wheels    Recommendations for Other Services       Precautions / Restrictions Precautions Precautions: Fall Precaution Comments: reports 2-3 falls this past year 2* dizziness from schizophrenia meds per pt; monitor O2 Restrictions Weight Bearing Restrictions: No      Mobility  Bed Mobility Overal bed mobility: Needs Assistance Bed Mobility: Supine to Sit     Supine to sit: Min assist     General bed mobility comments: min A to raise trunk, HOB up 20*  Transfers Overall transfer level: Needs assistance Equipment used: Rolling walker (2 wheeled) Transfers: Sit to/from Stand Sit to Stand: Min guard         General transfer comment: verbal cues for hand placement  Ambulation/Gait Ambulation/Gait assistance: Min guard Ambulation Distance (Feet): 140 Feet Assistive device: Rolling walker (2 wheeled) Gait Pattern/deviations: Step-through pattern   Gait velocity interpretation: Below normal speed for age/gender General Gait Details: steady with RW, walked with 3L O2 [place], RR 29-35 with walking, SAO2 89-90% walking, verbal cues for pursed lip breathing, no LOB  Stairs            Wheelchair Mobility     Modified Rankin (Stroke Patients Only)       Balance Overall balance assessment: Needs assistance;History of Falls   Sitting balance-Leahy Scale: Good       Standing balance-Leahy Scale: Fair                               Pertinent Vitals/Pain Pain Assessment: No/denies pain    Home Living Family/patient expects to be discharged to:: Assisted living               Home Equipment: None Additional Comments: ramp to enter    Prior Function Level of Independence: Independent         Comments: independent without AD, independent with sponge bath; 2-3 falls in past year     Hand Dominance        Extremity/Trunk Assessment   Upper Extremity Assessment: Overall WFL for tasks assessed           Lower Extremity Assessment: Overall WFL for tasks assessed      Cervical / Trunk Assessment: Normal  Communication   Communication: No difficulties  Cognition Arousal/Alertness: Awake/alert Behavior During Therapy: WFL for tasks assessed/performed Overall Cognitive Status: Within Functional Limits for tasks assessed                      General Comments General comments (skin integrity, edema, etc.): pt reports 2-3 falls in past year due to dizziness from schizophrenia meds (per pt)    Exercises  Assessment/Plan    PT Assessment Patient needs continued PT services  PT Diagnosis Generalized weakness   PT Problem List Cardiopulmonary status limiting activity;Decreased activity tolerance;Decreased balance;Decreased mobility  PT Treatment Interventions Gait training;Functional mobility training;Patient/family education;Therapeutic exercise;Balance training   PT Goals (Current goals can be found in the Care Plan section) Acute Rehab PT Goals Patient Stated Goal: return to ALF PT Goal Formulation: With patient Time For Goal Achievement: 03/01/15 Potential to Achieve Goals: Good    Frequency Min 3X/week   Barriers to discharge         Co-evaluation               End of Session Equipment Utilized During Treatment: Gait belt;Oxygen Activity Tolerance: Patient tolerated treatment well Patient left: in chair;with call bell/phone within reach Nurse Communication: Mobility status         Time: 8871-9597 PT Time Calculation (min) (ACUTE ONLY): 23 min   Charges:   PT Evaluation $Initial PT Evaluation Tier I: 1 Procedure PT Treatments $Gait Training: 8-22 mins   PT G Codes:        Philomena Doheny 02/15/2015, 10:52 AM [phone]

## 2015-02-15 NOTE — Progress Notes (Signed)
Patient ID: Craig Bentley, male   DOB: Jul 11, 1955, 59 y.o.   MRN: [id number]  [facility] PROGRESS NOTE  Craig Bentley [id number]:[id number] DOB: 05-Jun-1955 DOA: 02/13/2015 PCP: Pcp Not In System   Brief narrative:    59 y.o. male with known chizophrenia on clozapine, HTN, COPD, and NIDDM who presents with cough for four days associated with weakness, falls, fever, and chest tightness.  In the ED, the patient was febrile with T 101.48F, tachycardic, had leukocytosis and required supplemental oxygen by oxymask to bring his SpO2 to >90%. [name] were asked to admit for sepsis from pneumonia.  Assessment/Plan:    Principal Problem:   Acute respiratory failure with hypoxia (HCC) - Suspect this is related to pneumonia, viral versus bacterial, imposed on acute COPD exacerbation - CT chest w/o contrast confirmed in bilateral lower lobes and left upper lobe, ? Aspiration as well  - Continue broad-spectrum antibiotics for now vancomycin and cefepime day #3 - Follow-up on sputum culture urine will Legionella and strep pneumo - Narrow down antibiotics as clinically indicated - continue to treat acute COPD exacerbation with Solu-Medrol IV 40 mg daily, bronchodilators scheduled and as needed - Keep on oxygen to maintain saturations above 92%  Active Problems:   Sepsis due to bilateral lower lobes and LUL pneumonia ([place]) - Patient met criteria for sepsis on admission, suspect pneumonia as a source but not evident on chest x-ray - Physical exam is notable for left middle lung lobe dullness to percussion and bronchial breath sounds somewhat better today  - Continue current antibiotics as noted above - due to ? Aspiration, will request SLP evaluation     Acute COPD (chronic obstructive pulmonary disease) (HCC) - less wheezing this am but still present mostly with expiration  - Continue Solu-Medrol, bronchodilators scheduled and as needed    Diabetes mellitus with neurologic complication, without  long-term current use of insulin ([place]) - Continue with sliding scale insulin while inpatient - Continue gabapentin    Essential hypertension - Continue home medical regimen, HCTZ and metoprolol - Continue to hold lisinopril for now - added hydralazine as needed for better BP control   DVT prophylaxis - Lovenox SQ  Code Status: Full.  Family Communication:  plan of care discussed with the patient Disposition Plan: Not ready for discharge but may be able to move up to telemetry unit if oxygen sat remains > 92%  IV access:  Peripheral IV  Procedures and diagnostic studies:    Dg Chest 2 View 02/13/2015 No focal consolidation.   Ct Chest Wo Contrast 02/14/2015  Patchy areas of airspace consolidation in the dependent portions of the bilateral lower lobes and left upper lobe, likely infectious or inflammatory. The dependent distribution of the process suggests the possibility of aspiration pneumonia. 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 Follow-up with adrenal protocol abdominal CT may be considered if found clinically necessary.   Medical Consultants:  None   Other Consultants:  None   IAnti-Infectives:   Vancomycin 12/22  -> Cefepime 12/22 -->  [name], MD  [facility] Pager [phone]  If 7PM-7AM, please contact night-coverage [email or web address] Password [facility] 02/15/2015, 7:33 AM   LOS: 2 days   HPI/Subjective: No events overnight. Still with dyspnea but says overall better.   Objective: Filed Vitals:   02/15/15 0410 02/15/15 0500 02/15/15 0600 02/15/15 0700  BP:  114/44 132/60 135/64  Pulse:  75 74 80  Temp: 98.3 F (36.8 C)     TempSrc: Oral     Resp:  '26 28 30  ' Height:      Weight:      SpO2:  98% 99% 98%    Intake/Output Summary (Last 24 hours) at 02/15/15 0733 Last data filed at 02/15/15 0700  Gross per 24 hour  Intake 3155.75 ml  Output   5126 ml  Net -1970.25 ml    Exam:   General:  Pt is alert, not in acute  distress  Cardiovascular: Regular rate and rhythm, S1/S2, no murmurs, no rubs, no gallops  Respiratory: bronchial breath sounds with exp wheezing mostly in upper and middle lobes, dullness to percussion in the mid left lobe   Abdomen: Soft, non tender, non distended, bowel sounds present, no guarding  Extremities: pulses DP and PT palpable bilaterally  Neuro: Grossly nonfocal  Data Reviewed: Basic Metabolic Panel:  Recent Labs Lab 02/13/15 1736 02/14/15 0421 02/15/15 0352  NA 135 136 138  K 4.2 4.5 4.9  CL 95* 100* 103  CO2 '29 25 28  ' GLUCOSE 144* 294* 156*  BUN 25* 28* 20  CREATININE 1.16 1.18 0.76  CALCIUM 8.9 8.7* 8.8*   CBC:  Recent Labs Lab 02/13/15 1736 02/13/15 2330 02/14/15 0421 02/15/15 0352  WBC 12.0* 11.2* 10.0 17.4*  NEUTROABS 8.2*  --   --   --   HGB 12.2* 10.4* 10.9* 10.8*  HCT 38.3* 32.8* 34.9* 34.4*  MCV 87.6 87.2 86.4 87.1  PLT 190 176 181 199   CBG:  Recent Labs Lab 02/14/15 0612 02/14/15 0753 02/14/15 1204 02/14/15 1731 02/14/15 2134  GLUCAP 226* 231* 230* 129* 236*    Recent Results (from the past 240 hour(s))  MRSA PCR Screening     Status: None   Collection Time: 02/13/15 11:37 PM  Result Value Ref Range Status   MRSA by PCR NEGATIVE NEGATIVE Final    Scheduled Meds: . ceFEPime IV  1 g Intravenous 3 times per day  . cloZAPine  100 mg Oral BID WC  . cloZAPine  400 mg Oral QHS  . donepezil  5 mg Oral QHS  . enoxaparin  injection  40 mg Subcutaneous QHS  . gabapentin  300 mg Oral TID  . haloperidol  10 mg Oral QHS  . hydrochlorothiazide  25 mg Oral Daily  . insulin aspart  0-5 Units Subcutaneous QHS  . insulin aspart  0-9 Units Subcutaneous TID WC  . lactulose  30 g Oral BID  . metoprolol tartrate  25 mg Oral BID  . senna  1 tablet Oral BID  . simvastatin  5 mg Oral QHS  . trihexyphenidyl  5 mg Oral BID WC  . vancomycin  1,000 mg Intravenous Q12H   Continuous Infusions: . sodium chloride 50 mL/hr (02/14/15 1403)

## 2015-02-16 LAB — BASIC METABOLIC PANEL
Anion gap: 9 (ref 5–15)
BUN: 19 mg/dL (ref 6–20)
CHLORIDE: 100 mmol/L — AB (ref 101–111)
CO2: 29 mmol/L (ref 22–32)
Calcium: 8.8 mg/dL — ABNORMAL LOW (ref 8.9–10.3)
Creatinine, Ser: 0.73 mg/dL (ref 0.61–1.24)
GFR calc Af Amer: >60 mL/min (ref >60)
GFR calc non Af Amer: >60 mL/min (ref >60)
GLUCOSE: 133 mg/dL — AB (ref 65–99)
POTASSIUM: 4.2 mmol/L (ref 3.5–5.1)
SODIUM: 138 mmol/L (ref 135–145)

## 2015-02-16 LAB — CBC
HEMATOCRIT: 34.6 % — AB (ref 39.0–52.0)
HEMOGLOBIN: 10.7 g/dL — AB (ref 13.0–17.0)
MCH: 27 pg (ref 26.0–34.0)
MCHC: 30.9 g/dL (ref 30.0–36.0)
MCV: 87.2 fL (ref 78.0–100.0)
Platelets: 211 10*3/uL (ref 150–400)
RBC: 3.97 MIL/uL — AB (ref 4.22–5.81)
RDW: 13 % (ref 11.5–15.5)
WBC: 11.1 10*3/uL — ABNORMAL HIGH (ref 4.0–10.5)

## 2015-02-16 LAB — GLUCOSE, CAPILLARY
GLUCOSE-CAPILLARY: 299 mg/dL — AB (ref 65–99)
GLUCOSE-CAPILLARY: 191 mg/dL — AB (ref 65–99)
GLUCOSE-CAPILLARY: 280 mg/dL — AB (ref 65–99)
Glucose-Capillary: 103 mg/dL — ABNORMAL HIGH (ref 65–99)
Glucose-Capillary: 255 mg/dL — ABNORMAL HIGH (ref 65–99)

## 2015-02-16 MED ORDER — IPRATROPIUM-ALBUTEROL 0.5-2.5 (3) MG/3ML IN SOLN
3.0000 mL | Freq: Three times a day (TID) | RESPIRATORY_TRACT | Status: DC
  Administered 2015-02-16 – 2015-02-20 (×13): 3 mL via RESPIRATORY_TRACT
  Filled 2015-02-16 (×13): qty 3

## 2015-02-16 NOTE — Progress Notes (Signed)
Patient ID: Craig Bentley, male   DOB: 26-May-1955, 59 y.o.   MRN: [id number]  [facility] PROGRESS NOTE  Abdullah Rizzi [id number] DOB: Oct 16, 1955 DOA: 02/13/2015 PCP: Pcp Not In System   Brief narrative:    59 y.o. male with known chizophrenia on clozapine, HTN, COPD, and NIDDM who presents with cough for four days associated with weakness, falls, fever, and chest tightness.  In the ED, the patient was febrile with T 101.53F, tachycardic, had leukocytosis and required supplemental oxygen by oxymask to bring his SpO2 to >90%. [name] were asked to admit for sepsis from pneumonia.  Assessment/Plan:    Principal Problem:   Acute respiratory failure with hypoxia (HCC) - Suspect this is related to pneumonia, viral versus bacterial, imposed on acute COPD exacerbation - CT chest w/o contrast confirmed in bilateral lower lobes and left upper lobe, ? Aspiration as well  - Continued broad-spectrum antibiotics, today is vancomycin and cefepime day #4, plan to transition to oral ABX this afternoon  - Follow-up on sputum culture urine will Legionella and strep pneumo - continue to treat acute COPD exacerbation with Solu-Medrol IV 40 mg daily, bronchodilators scheduled and as needed - Keep on oxygen to maintain saturations above 92%  Active Problems:   Sepsis due to bilateral lower lobes and LUL pneumonia ([place]) - Patient met criteria for sepsis on admission, suspect pneumonia as a source but not evident on chest x-ray - Physical exam is notable for left middle lung lobe dullness to percussion and bronchial breath sounds somewhat better today  - Continue current antibiotics as noted above - due to ? Aspiration, will request SLP evaluation     Acute COPD (chronic obstructive pulmonary disease) (HCC) - less wheezing this am but still present mostly with expiration  - Continue Solu-Medrol, bronchodilators scheduled and as needed    Diabetes mellitus with neurologic complication, without  long-term current use of insulin ([place]) - Continue with sliding scale insulin while inpatient - Continue gabapentin    Essential hypertension - Continue home medical regimen, HCTZ and metoprolol - Continue to hold lisinopril for now - added hydralazine as needed for better BP control   DVT prophylaxis - Lovenox SQ  Code Status: Full.  Family Communication:  plan of care discussed with the patient Disposition Plan: transfer to telemetry unit if oxygen sat remains > 92%  IV access:  Peripheral IV  Procedures and diagnostic studies:    Dg Chest 2 View 02/13/2015 No focal consolidation.   Ct Chest Wo Contrast 02/14/2015  Patchy areas of airspace consolidation in the dependent portions of the bilateral lower lobes and left upper lobe, likely infectious or inflammatory. The dependent distribution of the process suggests the possibility of aspiration pneumonia. 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 Follow-up with adrenal protocol abdominal CT may be considered if found clinically necessary.   Medical Consultants:  None   Other Consultants:  None   IAnti-Infectives:   Vancomycin 12/22  -> Cefepime 12/22 -->  Craig Ramsay, Craig Bentley  [facility] Pager [phone]  If 7PM-7AM, please contact night-coverage [email or web address] Password [facility] 02/16/2015, 8:56 AM   LOS: 3 days   HPI/Subjective: No events overnight. Still with dyspnea but says overall better.   Objective: Filed Vitals:   02/16/15 0500 02/16/15 0520 02/16/15 0600 02/16/15 0800  BP:   147/70 151/69  Pulse:   28 79  Temp: 98.5 F (36.9 C)     TempSrc: Oral     Resp:   21 27  Height:  Weight: 82.8 kg (182 lb 8.7 oz)     SpO2:  97% 97% 96%    Intake/Output Summary (Last 24 hours) at 02/16/15 0856 Last data filed at 02/16/15 0650  Gross per 24 hour  Intake    550 ml  Output   2350 ml  Net  -1800 ml    Exam:   General:  Pt is alert, not in acute distress  Cardiovascular: Regular  rate and rhythm, S1/S2, no murmurs, no rubs, no gallops  Respiratory: bronchial breath sounds with exp wheezing mostly in upper and middle lobes, dullness to percussion in the mid left lobe   Abdomen: Soft, non tender, non distended, bowel sounds present, no guarding  Extremities: pulses DP and PT palpable bilaterally  Neuro: Grossly nonfocal  Data Reviewed: Basic Metabolic Panel:  Recent Labs Lab 02/13/15 1736 02/14/15 0421 02/15/15 0352 02/16/15 0358  NA 135 136 138 138  K 4.2 4.5 4.9 4.2  CL 95* 100* 103 100*  CO2 '29 25 28 29  ' GLUCOSE 144* 294* 156* 133*  BUN 25* 28* 20 19  CREATININE 1.16 1.18 0.76 0.73  CALCIUM 8.9 8.7* 8.8* 8.8*   CBC:  Recent Labs Lab 02/13/15 1736 02/13/15 2330 02/14/15 0421 02/15/15 0352 02/16/15 0358  WBC 12.0* 11.2* 10.0 17.4* 11.1*  NEUTROABS 8.2*  --   --   --   --   HGB 12.2* 10.4* 10.9* 10.8* 10.7*  HCT 38.3* 32.8* 34.9* 34.4* 34.6*  MCV 87.6 87.2 86.4 87.1 87.2  PLT 190 176 181 199 211   CBG:  Recent Labs Lab 02/15/15 0800 02/15/15 1156 02/15/15 1606 02/15/15 2003 02/16/15 0746  GLUCAP 147* 159* 256* 299* 103*    Recent Results (from the past 240 hour(s))  MRSA PCR Screening     Status: None   Collection Time: 02/13/15 11:37 PM  Result Value Ref Range Status   MRSA by PCR NEGATIVE NEGATIVE Final    Scheduled Meds: . ceFEPime IV  1 g Intravenous 3 times per day  . cloZAPine  100 mg Oral BID WC  . cloZAPine  400 mg Oral QHS  . donepezil  5 mg Oral QHS  . enoxaparin  injection  40 mg Subcutaneous QHS  . gabapentin  300 mg Oral TID  . haloperidol  10 mg Oral QHS  . hydrochlorothiazide  25 mg Oral Daily  . insulin aspart  0-5 Units Subcutaneous QHS  . insulin aspart  0-9 Units Subcutaneous TID WC  . lactulose  30 g Oral BID  . metoprolol tartrate  25 mg Oral BID  . senna  1 tablet Oral BID  . simvastatin  5 mg Oral QHS  . trihexyphenidyl  5 mg Oral BID WC  . vancomycin  1,000 mg Intravenous Q12H   Continuous  Infusions:

## 2015-02-16 NOTE — Progress Notes (Signed)
ANTIBIOTIC CONSULT NOTE - follow up  Pharmacy Consult for vancomycin and cefepime Indication: pneumonia  No Known Allergies  Patient Measurements: Height: 6' (182.9 cm) Weight: 182 lb 8.7 oz (82.8 kg) IBW/kg (Calculated) : 77.6   Vital Signs: Temp: 98.5 F (36.9 C) (12/25 0500) Temp Source: Oral (12/25 0500) BP: 151/69 mmHg (12/25 0916) Pulse Rate: 90 (12/25 0916) Intake/Output from previous day: 12/24 0701 - 12/25 0700 In: 550 [P.O.:240; I.V.:10; IV Piggyback:300] Out: 2350 [Urine:2350] Intake/Output from this shift:    Labs:  Recent Labs  02/14/15 0421 02/15/15 0352 02/16/15 0358  WBC 10.0 17.4* 11.1*  HGB 10.9* 10.8* 10.7*  PLT 181 199 211  CREATININE 1.18 0.76 0.73   Estimated Creatinine Clearance: 107.8 mL/min (by C-G formula based on Cr of 0.73). No results for input(s): VANCOTROUGH, VANCOPEAK, VANCORANDOM, GENTTROUGH, GENTPEAK, GENTRANDOM, TOBRATROUGH, TOBRAPEAK, TOBRARND, AMIKACINPEAK, AMIKACINTROU, AMIKACIN in the last 72 hours.   Microbiology: Recent Results (from the past 720 hour(s))  Culture, blood (Routine X 2) w Reflex to ID Panel     Status: None (Preliminary result)   Collection Time: 02/13/15  6:00 PM  Result Value Ref Range Status   Specimen Description BLOOD LEFT HAND  Final   Special Requests BOTTLES DRAWN AEROBIC AND ANAEROBIC 10ML,5ML  Final   Culture   Final    NO GROWTH 2 DAYS Performed at [facility]    Report Status PENDING  Incomplete  Culture, blood (Routine X 2) w Reflex to ID Panel     Status: None (Preliminary result)   Collection Time: 02/13/15  6:20 PM  Result Value Ref Range Status   Specimen Description BLOOD LEFT FOREARM  Final   Special Requests BOTTLES DRAWN AEROBIC AND ANAEROBIC 5CC EACH  Final   Culture  Setup Time   Final    GRAM POSITIVE COCCI IN CLUSTERS IN BOTH AEROBIC AND ANAEROBIC BOTTLES CRITICAL RESULT CALLED TO, READ BACK BY AND VERIFIED WITH: A ASHLEY RN [id number] 02/14/15 A BROWNING    Culture    Final    GRAM POSITIVE COCCI Performed at [facility]    Report Status PENDING  Incomplete  MRSA PCR Screening     Status: None   Collection Time: 02/13/15 11:37 PM  Result Value Ref Range Status   MRSA by PCR NEGATIVE NEGATIVE Final    Comment:        The GeneXpert MRSA Assay (FDA approved for NASAL specimens only), is one component of a comprehensive MRSA colonization surveillance program. It is not intended to diagnose MRSA infection nor to guide or monitor treatment for MRSA infections.   Urine culture     Status: None   Collection Time: 02/14/15  2:44 AM  Result Value Ref Range Status   Specimen Description URINE, CLEAN CATCH  Final   Special Requests NONE  Final   Culture   Final    MULTIPLE SPECIES PRESENT, SUGGEST RECOLLECTION Performed at [facility]    Report Status 02/15/2015 FINAL  Final    Medical History: Past Medical History  Diagnosis Date  . Hypertension   . Diabetes mellitus   . Schizophrenia, schizo-affective ([place])   . COPD (chronic obstructive pulmonary disease) ([place])   . Constipation   . Fecal impaction [facility])      Assessment: 59 year old male with history of COPD, hypertension, diabetes and schizophrenia, he lives in assisted nursing facility, he presents to the ER for evaluation of nonproductive cough and shortness of breath with central, substernal chest pain, worse  with inspiration, for the past 4 days. He confirms he is a current smoker with a 50 to 75-pack-year history  - afeb, WBC improved, SCr stable  12/22 >> cefepime >> 12/22 >> vanc >>    12/22 Blood x 2: 1/2 GPC 12/22 Sputum: ordered 12/23 Urine: collected 12/23 S.Pneumo UAg: negative 12/23 Legionella UAg: IP 12/23 Influenza A and B by PCR: negative 12/22 MRSA PCR Screen: negative  Dose changes/levels:  12/24 SCr improved, CrCl >100 N and CG but opted not to inc vanc from current dosage (1g q12h) given clinical response and neg MRSA screen.   Goal of  Therapy:  Vancomycin trough level 15-20 mcg/ml Cefepime per renal function  Plan:  1) continue cefepime 1g q8 2) continue vancomycin 1g q12 3) noted plan per Md to change to PO abx's this PM - follow up   Adrian Saran, PharmD, BCPS Pager [phone] 02/16/2015 9:31 AM

## 2015-02-17 DIAGNOSIS — J430 Unilateral pulmonary emphysema [MacLeod's syndrome]: Secondary | ICD-10-CM

## 2015-02-17 LAB — BASIC METABOLIC PANEL
Anion gap: 11 (ref 5–15)
BUN: 21 mg/dL — AB (ref 6–20)
CALCIUM: 9.3 mg/dL (ref 8.9–10.3)
CO2: 29 mmol/L (ref 22–32)
Chloride: 98 mmol/L — ABNORMAL LOW (ref 101–111)
Creatinine, Ser: 0.74 mg/dL (ref 0.61–1.24)
GFR calc Af Amer: >60 mL/min (ref >60)
GLUCOSE: 139 mg/dL — AB (ref 65–99)
Potassium: 4 mmol/L (ref 3.5–5.1)
SODIUM: 138 mmol/L (ref 135–145)

## 2015-02-17 LAB — GLUCOSE, CAPILLARY
GLUCOSE-CAPILLARY: 186 mg/dL — AB (ref 65–99)
Glucose-Capillary: 112 mg/dL — ABNORMAL HIGH (ref 65–99)
Glucose-Capillary: 197 mg/dL — ABNORMAL HIGH (ref 65–99)
Glucose-Capillary: 216 mg/dL — ABNORMAL HIGH (ref 65–99)

## 2015-02-17 LAB — CBC
HCT: 35.9 % — ABNORMAL LOW (ref 39.0–52.0)
Hemoglobin: 11.1 g/dL — ABNORMAL LOW (ref 13.0–17.0)
MCH: 27 pg (ref 26.0–34.0)
MCHC: 30.9 g/dL (ref 30.0–36.0)
MCV: 87.3 fL (ref 78.0–100.0)
PLATELETS: 228 10*3/uL (ref 150–400)
RBC: 4.11 MIL/uL — AB (ref 4.22–5.81)
RDW: 12.8 % (ref 11.5–15.5)
WBC: 11.7 10*3/uL — AB (ref 4.0–10.5)

## 2015-02-17 LAB — LEGIONELLA ANTIGEN, URINE

## 2015-02-17 LAB — CULTURE, BLOOD (ROUTINE X 2)

## 2015-02-17 MED ORDER — METHYLPREDNISOLONE SODIUM SUCC 40 MG IJ SOLR
40.0000 mg | Freq: Two times a day (BID) | INTRAMUSCULAR | Status: DC
  Administered 2015-02-17: 40 mg via INTRAVENOUS
  Filled 2015-02-17: qty 1

## 2015-02-17 MED ORDER — GUAIFENESIN ER 600 MG PO TB12
600.0000 mg | ORAL_TABLET | Freq: Two times a day (BID) | ORAL | Status: DC
  Administered 2015-02-17 – 2015-02-20 (×7): 600 mg via ORAL
  Filled 2015-02-17 (×7): qty 1

## 2015-02-17 MED ORDER — LEVOFLOXACIN 500 MG PO TABS
500.0000 mg | ORAL_TABLET | Freq: Every day | ORAL | Status: DC
  Administered 2015-02-17 – 2015-02-20 (×4): 500 mg via ORAL
  Filled 2015-02-17 (×4): qty 1

## 2015-02-17 NOTE — Clinical Social Work Note (Signed)
Clinical Social Work Assessment  Patient Details  Name: Craig Bentley MRN: [id number] Date of Birth: [birth year]  Date of referral:  02/17/15               Reason for consult:  Facility Placement                Permission sought to share information with:  Facility Art therapist granted to share information::     Name::        Agency::     Relationship::     Contact Information:     Housing/Transportation Living arrangements for the past 2 months:  [place] of Information:  Patient Patient Interpreter Needed:  None Criminal Activity/Legal Involvement Pertinent to Current Situation/Hospitalization:  No - Comment as needed Significant Relationships:  Siblings (Pt reports he has sister(s)) Lives with:  Self Do you feel safe going back to the place where you live?  Yes Need for family participation in patient care:  No (Coment)  Care giving concerns:  Pt is realistic regarding care and agrees to return to [facility], ALF.    Social Worker assessment / plan:  CSW received consult for pt who presented from [facility], ALF. CSW met with the pt at bedside. Pt is agreeable about returning to ALF upon d/c. CSW offered to contact pt's family (sisters); pt refused stating he will contact family upon d/c. CSW will follow-up with pt to ensure a smooth transition. CSW attempted to reach [facility] via phone but was unsuccessful. CSW will attempt to contact [facility] staff tomorrow morning.   Employment status:  Retired Forensic scientist:  Medicaid In [place] PT Recommendations:  No Follow Up Information / Referral to community resources:     Patient/Family's Response to care:  Pt is realistic regarding his care/needs.   Patient/Family's Understanding of and Emotional Response to Diagnosis, Current Treatment, and Prognosis: Pt expressed understanding. Pt is hopefully that O2 will be discontinued upon d/c.   Emotional Assessment Appearance:  Appears  stated age Attitude/Demeanor/Rapport:   (pleasant and agreeable) Affect (typically observed):  Accepting, Adaptable Orientation:  Oriented to Self, Oriented to Place, Oriented to  Time, Oriented to Situation Alcohol / Substance use:    Psych involvement (Current and /or in the community):     Discharge Needs  Concerns to be addressed:  No discharge needs identified Readmission within the last 30 days:  No Current discharge risk:  None Barriers to Discharge:  No Barriers Identified   Linna Darner, LCSW 02/17/2015, 4:23 PM

## 2015-02-17 NOTE — Progress Notes (Signed)
Physical Therapy Treatment Patient Details Name: Craig Bentley MRN: [id number] DOB: 06/07/1955 Today's Date: 02/17/2015    History of Present Illness 59 y.o. male with h/o COPD, HTN, schizophrenia,NIDDM admitted with PNA, sepsis.     PT Comments    Pt in bed on 3 lts 94%.  Removed O2 for trial, RA at rest 88%.  Pt coughing and audible congestion noted.  Assisted with amb in hallway a great distance however required supplemental oxygen to achieve therapeutic levels.    SATURATION QUALIFICATIONS: (This note is used to comply with regulatory documentation for home oxygen)  Patient Saturations on Room Air at Rest = 88%  Patient Saturations on Room Air while Ambulating = 88%  Patient Saturations on 2 Liters of oxygen while Ambulating = 91%  Please briefly explain why patient needs home oxygen: to achieve therapeutic level  Follow Up Recommendations  No PT follow up     Equipment Recommendations  Rolling walker with 5" wheels    Recommendations for Other Services       Precautions / Restrictions Precautions Precautions: Fall Precaution Comments: reports 2-3 falls this past year 2* dizziness from schizophrenia meds per pt; monitor O2 Restrictions Weight Bearing Restrictions: No    Mobility  Bed Mobility Overal bed mobility: Needs Assistance Bed Mobility: Supine to Sit;Sit to Supine     Supine to sit: Supervision;Min guard Sit to supine: Supervision;Min guard   General bed mobility comments: increased time  Transfers Overall transfer level: Needs assistance Equipment used: Rolling walker (2 wheeled) Transfers: Sit to/from Stand Sit to Stand: Supervision;Min guard         General transfer comment: verbal cues for hand placement  Ambulation/Gait Ambulation/Gait assistance: Supervision;Min guard Ambulation Distance (Feet): 155 Feet Assistive device: Rolling walker (2 wheeled) Gait Pattern/deviations: Step-to pattern;Step-through pattern;Trunk flexed Gait  velocity: decreased   General Gait Details: RA at rest 88%.  RA with amb 88% with avg HR 109.  Reapplied 2 lts to achieve sats 91%.  good alternating gait.  No LOB.    Stairs            Wheelchair Mobility    Modified Rankin (Stroke Patients Only)       Balance                                    Cognition Arousal/Alertness: Awake/alert Behavior During Therapy: WFL for tasks assessed/performed Overall Cognitive Status: Within Functional Limits for tasks assessed                      Exercises      General Comments        Pertinent Vitals/Pain Pain Assessment: No/denies pain    Home Living                      Prior Function            PT Goals (current goals can now be found in the care plan section) Progress towards PT goals: Progressing toward goals    Frequency  Min 3X/week    PT Plan      Co-evaluation             End of Session Equipment Utilized During Treatment: Gait belt;Oxygen Activity Tolerance: Patient tolerated treatment well Patient left: in bed;with call bell/phone within reach;with family/visitor present     Time: 2542-7062 PT Time Calculation (min) (ACUTE ONLY):  23 min  Charges:  $Gait Training: 8-22 mins $Therapeutic Activity: 8-22 mins                    G Codes:      Rica Koyanagi  PTA [facility]  Acute  Rehab Pager      [phone]

## 2015-02-17 NOTE — Progress Notes (Signed)
Patient ID: Craig Bentley, male   DOB: 05-Jul-1955, 59 y.o.   MRN: [id number]  [facility] PROGRESS NOTE  Craig Bentley GLO:[id number] DOB: February 20, 1956 DOA: 02/13/2015 PCP: Pcp Not In System   Brief narrative:    59 y.o. male with known chizophrenia on clozapine, HTN, COPD, and NIDDM who presents with cough for four days associated with weakness, falls, fever, and chest tightness.  In the ED, the patient was febrile with T 101.61F, tachycardic, had leukocytosis and required supplemental oxygen by oxymask to bring his SpO2 to >90%. [name] were asked to admit for sepsis from pneumonia.  Assessment/Plan:    Principal Problem:   Acute respiratory failure with hypoxia (HCC) - Suspect this is related to pneumonia, viral versus bacterial, imposed on acute COPD exacerbation - CT chest w/o contrast confirmed in bilateral lower lobes and left upper lobe, ? Aspiration as well  - Continued broad-spectrum antibiotics, today is vancomycin and cefepime day #5, plan to transition to oral ABX Levaquin  - continue to treat acute COPD exacerbation with Solu-Medrol IV 40 mg as pt still wheezing, bronchodilators scheduled and as needed - Keep on oxygen to maintain saturations above 92%  Active Problems:   Sepsis due to bilateral lower lobes and LUL pneumonia ([place]) - Patient met criteria for sepsis on admission, suspect pneumonia as a source but not evident on chest x-ray - Physical exam is notable for left middle lung lobe dullness to percussion and bronchial breath sounds somewhat better today  - Continue current antibiotics as noted above    Acute COPD (chronic obstructive pulmonary disease) (HCC) - less wheezing this am but still present mostly with expiration  - Continue Solu-Medrol, bronchodilators scheduled and as needed    Diabetes mellitus with neurologic complication, without long-term current use of insulin ([place]) - Continue with sliding scale insulin while inpatient - Continue gabapentin     Essential hypertension - Continue home medical regimen, HCTZ and metoprolol - Continue to hold lisinopril for now - added hydralazine as needed for better BP control   DVT prophylaxis - Lovenox SQ  Code Status: Full.  Family Communication:  plan of care discussed with the patient Disposition Plan: possibly by 12/27  IV access:  Peripheral IV  Procedures and diagnostic studies:    Dg Chest 2 View 02/13/2015 No focal consolidation.   Ct Chest Wo Contrast 02/14/2015  Patchy areas of airspace consolidation in the dependent portions of the bilateral lower lobes and left upper lobe, likely infectious or inflammatory. The dependent distribution of the process suggests the possibility of aspiration pneumonia. 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 Follow-up with adrenal protocol abdominal CT may be considered if found clinically necessary.   Medical Consultants:  None   Other Consultants:  None   IAnti-Infectives:   Vancomycin 12/22  --> 12/26 Cefepime 12/22 --> 12/26 Levaquin 12/26 -->  Craig Ramsay, MD  [name] Pager [phone]  If 7PM-7AM, please contact night-coverage [email or web address] Password [facility] 02/17/2015, 1:41 PM   LOS: 4 days   HPI/Subjective: No events overnight. Still with dyspnea but says overall better.   Objective: Filed Vitals:   02/16/15 2126 02/16/15 2211 02/17/15 0449 02/17/15 0829  BP:  132/63 149/75   Pulse:  76 77   Temp:  98.1 F (36.7 C) 98.1 F (36.7 C)   TempSrc:  Oral Oral   Resp:  20 20   Height:      Weight:      SpO2: 95% 93% 92% 97%    Intake/Output  Summary (Last 24 hours) at 02/17/15 1341 Last data filed at 02/17/15 0900  Gross per 24 hour  Intake    860 ml  Output   1200 ml  Net   -340 ml    Exam:   General:  Pt is alert, not in acute distress  Cardiovascular: Regular rate and rhythm, S1/S2, no murmurs, no rubs, no gallops  Respiratory: bronchial breath sounds with exp wheezing mostly in  upper and middle lobes, dullness to percussion in upper left lobe   Abdomen: Soft, non tender, non distended, bowel sounds present, no guarding  Extremities: pulses DP and PT palpable bilaterally  Neuro: Grossly nonfocal  Data Reviewed: Basic Metabolic Panel:  Recent Labs Lab 02/13/15 1736 02/14/15 0421 02/15/15 0352 02/16/15 0358 02/17/15 0514  NA 135 136 138 138 138  K 4.2 4.5 4.9 4.2 4.0  CL 95* 100* 103 100* 98*  CO2 _0 GLUCOSE 144* 294* 156* 133* 139*  BUN 25* 28* 20 19 21*  CREATININE 1.16 1.18 0.76 0.73 0.74  CALCIUM 8.9 8.7* 8.8* 8.8* 9.3   CBC:  Recent Labs Lab 02/13/15 1736 02/13/15 2330 02/14/15 0421 02/15/15 0352 02/16/15 0358 02/17/15 0514  WBC 12.0* 11.2* 10.0 17.4* 11.1* 11.7*  NEUTROABS 8.2*  --   --   --   --   --   HGB 12.2* 10.4* 10.9* 10.8* 10.7* 11.1*  HCT 38.3* 32.8* 34.9* 34.4* 34.6* 35.9*  MCV 87.6 87.2 86.4 87.1 87.2 87.3  PLT 190 176 181 199 211 228   CBG:  Recent Labs Lab 02/16/15 1138 02/16/15 1654 02/16/15 2209 02/17/15 0738 02/17/15 1222  GLUCAP 191* 255* 280* 112* 197*    Recent Results (from the past 240 hour(s))  MRSA PCR Screening     Status: None   Collection Time: 02/13/15 11:37 PM  Result Value Ref Range Status   MRSA by PCR NEGATIVE NEGATIVE Final    Scheduled Meds: . ceFEPime IV  1 g Intravenous 3 times per day  . cloZAPine  100 mg Oral BID WC  . cloZAPine  400 mg Oral QHS  . donepezil  5 mg Oral QHS  . enoxaparin  injection  40 mg Subcutaneous QHS  . gabapentin  300 mg Oral TID  . haloperidol  10 mg Oral QHS  . hydrochlorothiazide  25 mg Oral Daily  . insulin aspart  0-5 Units Subcutaneous QHS  . insulin aspart  0-9 Units Subcutaneous TID WC  . lactulose  30 g Oral BID  . metoprolol tartrate  25 mg Oral BID  . senna  1 tablet Oral BID  . simvastatin  5 mg Oral QHS  . trihexyphenidyl  5 mg Oral BID WC  . vancomycin  1,000 mg Intravenous Q12H   Continuous Infusions:

## 2015-02-17 NOTE — NC FL2 (Signed)
[place] LEVEL OF CARE SCREENING TOOL     IDENTIFICATION  Patient Name: Craig Bentley Birthdate: [birth year] Sex: male Admission Date (Current Location): 02/13/2015  [facility] and [place] Number:      Facility and Address:  [facility],  [place] [address], [place]      Provider Number: [id number]  Attending Physician Name and Address:  Theodis Blaze, MD  Relative Name and Phone Number:       Current Level of Care: Hospital Recommended Level of Care: [place] Prior Approval Number:    Date Approved/Denied:   PASRR Number:    Discharge Plan: Other (Comment) (ALF)    Current Diagnoses: Patient Active Problem List   Diagnosis Date Noted  . Dyspnea   . Respiratory distress   . Sepsis due to pneumonia ([place]) 02/14/2015  . Acute respiratory failure with hypoxia ([place]) 02/13/2015  . Diabetes mellitus with neurologic complication, without long-term current use of insulin ([place]) 07/20/2011  . Essential hypertension   . COPD (chronic obstructive pulmonary disease) (HCC)     Orientation RESPIRATION BLADDER Height & Weight    Self, Time, Situation, Place  O2 (3L) Incontinent '6\' 1"'$  (185.4 cm) 182 lbs.  BEHAVIORAL SYMPTOMS/MOOD NEUROLOGICAL BOWEL NUTRITION STATUS   (No behaviors)   Continent Diet (Carb modified)  AMBULATORY STATUS COMMUNICATION OF NEEDS Skin   Limited Assist Verbally Normal                       Personal Care Assistance Level of Assistance  Dressing, Bathing Bathing Assistance: Limited assistance   Dressing Assistance: Limited assistance     Functional Limitations Info             SPECIAL CARE FACTORS FREQUENCY  PT (By licensed PT)     PT Frequency: 3 X weekly               Contractures      Additional Factors Info  Insulin Sliding Scale, Code Status, Psychotropic (Full code)     Psychotropic Info: Clozaril, Haldol         Current Medications (02/17/2015):  This is the current  hospital active medication list Current Facility-Administered Medications  Medication Dose Route Frequency Provider Last Rate Last Dose  . acetaminophen (TYLENOL) tablet 650 mg  650 mg Oral Q6H PRN Edwin Dada, MD   650 mg at 02/14/15 0436   Or  . acetaminophen (TYLENOL) suppository 650 mg  650 mg Rectal Q6H PRN Edwin Dada, MD      . albuterol (PROVENTIL) (2.5 MG/3ML) 0.083% nebulizer solution 2.5 mg  2.5 mg Nebulization Q2H PRN Edwin Dada, MD   2.5 mg at 02/16/15 0520  . cloZAPine (CLOZARIL) tablet 100 mg  100 mg Oral BID WC Edwin Dada, MD   100 mg at 02/17/15 5852  . cloZAPine (CLOZARIL) tablet 400 mg  400 mg Oral QHS Edwin Dada, MD   400 mg at 02/16/15 2253  . donepezil (ARICEPT) tablet 5 mg  5 mg Oral QHS Edwin Dada, MD   5 mg at 02/16/15 2252  . enoxaparin (LOVENOX) injection 40 mg  40 mg Subcutaneous QHS Edwin Dada, MD   40 mg at 02/16/15 2249  . gabapentin (NEURONTIN) capsule 300 mg  300 mg Oral TID Edwin Dada, MD   300 mg at 02/17/15 1013  . guaiFENesin (MUCINEX) 12 hr tablet 600 mg  600 mg Oral BID Iskra M  Doyle Askew, MD   600 mg at 02/17/15 1514  . guaiFENesin (ROBITUSSIN) 100 MG/5ML solution 200 mg  200 mg Oral QID PRN Edwin Dada, MD      . haloperidol (HALDOL) tablet 10 mg  10 mg Oral QHS Edwin Dada, MD   10 mg at 02/16/15 2253  . hydrALAZINE (APRESOLINE) injection 5 mg  5 mg Intravenous Q4H PRN Theodis Blaze, MD      . hydrochlorothiazide (HYDRODIURIL) tablet 25 mg  25 mg Oral Daily Edwin Dada, MD   25 mg at 02/17/15 1013  . HYDROcodone-acetaminophen (NORCO/VICODIN) 5-325 MG per tablet 1-2 tablet  1-2 tablet Oral Q4H PRN Edwin Dada, MD   1 tablet at 02/16/15 0610  . insulin aspart (novoLOG) injection 0-5 Units  0-5 Units Subcutaneous QHS Edwin Dada, MD   3 Units at 02/16/15 2248  . insulin aspart (novoLOG) injection 0-9 Units  0-9 Units Subcutaneous  TID WC Edwin Dada, MD   2 Units at 02/17/15 1225  . ipratropium-albuterol (DUONEB) 0.5-2.5 (3) MG/3ML nebulizer solution 3 mL  3 mL Nebulization TID Theodis Blaze, MD   3 mL at 02/17/15 1450  . lactulose (CHRONULAC) 10 GM/15ML solution 30 g  30 g Oral BID Edwin Dada, MD   30 g at 02/17/15 1014  . levofloxacin (LEVAQUIN) tablet 500 mg  500 mg Oral Daily Theodis Blaze, MD   500 mg at 02/17/15 1514  . methylPREDNISolone sodium succinate (SOLU-MEDROL) 40 mg/mL injection 40 mg  40 mg Intravenous Q12H Theodis Blaze, MD      . metoprolol tartrate (LOPRESSOR) tablet 25 mg  25 mg Oral BID Edwin Dada, MD   25 mg at 02/17/15 1013  . ondansetron (ZOFRAN) tablet 4 mg  4 mg Oral Q6H PRN Edwin Dada, MD   4 mg at 02/14/15 1601   Or  . ondansetron (ZOFRAN) injection 4 mg  4 mg Intravenous Q6H PRN Edwin Dada, MD      . promethazine (PHENERGAN) injection 12.5-25 mg  12.5-25 mg Intravenous Q6H PRN Theodis Blaze, MD      . senna-docusate (Senokot-S) tablet 1 tablet  1 tablet Oral BID Theodis Blaze, MD   1 tablet at 02/17/15 1013  . simvastatin (ZOCOR) tablet 5 mg  5 mg Oral QHS Edwin Dada, MD   5 mg at 02/16/15 2251  . sodium chloride 0.9 % injection 3 mL  3 mL Intravenous Q12H Edwin Dada, MD   3 mL at 02/16/15 2253  . trihexyphenidyl (ARTANE) tablet 5 mg  5 mg Oral BID WC Edwin Dada, MD   5 mg at 02/17/15 1683     Discharge Medications: Please see discharge summary for a list of discharge medications.  Relevant Imaging Results:  Relevant Lab Results:   Additional Information SSN: [id number]; home health PT eval and treat at [place], LCSW

## 2015-02-18 DIAGNOSIS — J438 Other emphysema: Secondary | ICD-10-CM

## 2015-02-18 LAB — CBC
HCT: 36.5 % — ABNORMAL LOW (ref 39.0–52.0)
HEMOGLOBIN: 11.5 g/dL — AB (ref 13.0–17.0)
MCH: 27.1 pg (ref 26.0–34.0)
MCHC: 31.5 g/dL (ref 30.0–36.0)
MCV: 85.9 fL (ref 78.0–100.0)
Platelets: 261 10*3/uL (ref 150–400)
RBC: 4.25 MIL/uL (ref 4.22–5.81)
RDW: 12.8 % (ref 11.5–15.5)
WBC: 11.3 10*3/uL — ABNORMAL HIGH (ref 4.0–10.5)

## 2015-02-18 LAB — BASIC METABOLIC PANEL
Anion gap: 11 (ref 5–15)
BUN: 29 mg/dL — AB (ref 6–20)
CHLORIDE: 97 mmol/L — AB (ref 101–111)
CO2: 29 mmol/L (ref 22–32)
Calcium: 9.6 mg/dL (ref 8.9–10.3)
Creatinine, Ser: 0.81 mg/dL (ref 0.61–1.24)
GFR calc Af Amer: >60 mL/min (ref >60)
GFR calc non Af Amer: >60 mL/min (ref >60)
GLUCOSE: 197 mg/dL — AB (ref 65–99)
POTASSIUM: 4 mmol/L (ref 3.5–5.1)
SODIUM: 137 mmol/L (ref 135–145)

## 2015-02-18 LAB — GLUCOSE, CAPILLARY
GLUCOSE-CAPILLARY: 148 mg/dL — AB (ref 65–99)
GLUCOSE-CAPILLARY: 176 mg/dL — AB (ref 65–99)
GLUCOSE-CAPILLARY: 186 mg/dL — AB (ref 65–99)
Glucose-Capillary: 173 mg/dL — ABNORMAL HIGH (ref 65–99)

## 2015-02-18 LAB — CULTURE, BLOOD (ROUTINE X 2): Culture: NO GROWTH

## 2015-02-18 MED ORDER — PREDNISONE 50 MG PO TABS
50.0000 mg | ORAL_TABLET | Freq: Every day | ORAL | Status: DC
  Administered 2015-02-18 – 2015-02-19 (×2): 50 mg via ORAL
  Filled 2015-02-18 (×2): qty 1

## 2015-02-18 NOTE — Care Management Note (Signed)
Case Management Note  Patient Details  Name: Nell Schrack MRN: [id number] Date of Birth: 02/05/1956  Subjective/Objective: [facility] dme rep [place]-aware of home 02 order, & rw order-to deliver directly to [facility] since patient will d/c via ambulance-[facility] to [facility].                   Action/Plan:d/c plan return back to [facility]-ALF likely in am.   Expected Discharge Date:                  Expected Discharge Plan:  Assisted Living / Rest Home  In-House Referral:  Clinical Social Work  Discharge planning Services  CM Consult  Post Acute Care Choice:  NA Choice offered to:  Patient  DME Arranged:  Oxygen, Walker rolling DME Agency:  [place]:    [place]:     Status of Service:  Completed, signed off  Medicare Important Message Given:    Date Medicare IM Given:    Medicare IM give by:    Date Additional Medicare IM Given:    Additional Medicare Important Message give by:     If discussed at [place] of Stay Meetings, dates discussed:    Additional Comments:  Dessa Phi, RN 02/18/2015, 12:11 PM

## 2015-02-18 NOTE — Progress Notes (Signed)
Patient ID: Craig Bentley, male   DOB: Apr 03, 1955, 59 y.o.   MRN: [id number]  [facility] PROGRESS NOTE  Craig Bentley MPN:[id number] DOB: 1955-05-01 DOA: 02/13/2015 PCP: Pcp Not In System   Brief narrative:    59 y.o. male with known chizophrenia on clozapine, HTN, COPD, and NIDDM who presents with cough for four days associated with weakness, falls, fever, and chest tightness.  In the ED, the patient was febrile with T 101.22F, tachycardic, had leukocytosis and required supplemental oxygen by oxymask to bring his SpO2 to >90%. [name] were asked to admit for sepsis from pneumonia.  Assessment/Plan:    Principal Problem:   Acute respiratory failure with hypoxia (HCC) - Suspect this is related to pneumonia, viral versus bacterial, imposed on acute COPD exacerbation - CT chest w/o contrast confirmed in bilateral lower lobes and left upper lobe, ? Aspiration as well  - Continued broad-spectrum antibiotics, today is vancomycin and cefepime day #5, transitioned to oral ABX Levaquin 12/26 - taper solumedrol down to prednisone  - Keep on oxygen to maintain saturations above 92%  Active Problems:   Sepsis due to bilateral lower lobes and LUL pneumonia ([place]) - Patient met criteria for sepsis on admission, suspect pneumonia as a source but not evident on chest x-ray - Physical exam is notable for left middle lung lobe dullness to percussion and bronchial breath sounds better today  - Continue current antibiotics as noted above    Acute COPD (chronic obstructive pulmonary disease) (HCC) - less wheezing this am but still present mostly with expiration  - continue BD's as needed and scheduled  - taper down Prednisone     Diabetes mellitus with neurologic complication, without long-term current use of insulin ([place]) - Continue with sliding scale insulin while inpatient - Continue gabapentin    Essential hypertension - Continue home medical regimen, HCTZ and metoprolol - Continue to hold  lisinopril for now - added hydralazine as needed for better BP control   DVT prophylaxis - Lovenox SQ  Code Status: Full.  Family Communication:  plan of care discussed with the patient Disposition Plan: possibly by 12/28 to ALF if stable on Prednisone and no further wheezing   IV access:  Peripheral IV  Procedures and diagnostic studies:    Dg Chest 2 View 02/13/2015 No focal consolidation.   Ct Chest Wo Contrast 02/14/2015  Patchy areas of airspace consolidation in the dependent portions of the bilateral lower lobes and left upper lobe, likely infectious or inflammatory. The dependent distribution of the process suggests the possibility of aspiration pneumonia. 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 Follow-up with adrenal protocol abdominal CT may be considered if found clinically necessary.   Medical Consultants:  None   Other Consultants:  None   IAnti-Infectives:   Vancomycin 12/22  --> 12/26 Cefepime 12/22 --> 12/26 Levaquin 12/26 -->  [name], MD  [name] Pager [phone]  If 7PM-7AM, please contact night-coverage [email or web address] Password [name] 02/18/2015, 11:05 AM   LOS: 5 days   HPI/Subjective: No events overnight. Still with dyspnea but says overall better.   Objective: Filed Vitals:   02/17/15 2154 02/17/15 2209 02/18/15 0442 02/18/15 0825  BP:  152/70 156/76   Pulse:  86 87   Temp:  97.8 F (36.6 C) 98.5 F (36.9 C)   TempSrc:  Oral Oral   Resp:  20 20   Height:      Weight:      SpO2: 97% 96% 95% 93%    Intake/Output Summary (  Last 24 hours) at 02/18/15 1105 Last data filed at 02/18/15 0900  Gross per 24 hour  Intake   1133 ml  Output   2225 ml  Net  -1092 ml    Exam:   General:  Pt is alert, not in acute distress  Cardiovascular: Regular rate and rhythm, S1/S2, no murmurs, no rubs, no gallops  Respiratory: bronchial breath sounds with exp wheezing mostly in upper and middle lobe  Abdomen: Soft, non  tender, non distended, bowel sounds present, no guarding  Extremities: pulses DP and PT palpable bilaterally  Neuro: Grossly nonfocal  Data Reviewed: Basic Metabolic Panel:  Recent Labs Lab 02/14/15 0421 02/15/15 0352 02/16/15 0358 02/17/15 0514 02/18/15 0403  NA 136 138 138 138 137  K 4.5 4.9 4.2 4.0 4.0  CL 100* 103 100* 98* 97*  CO2 '25 28 29 29 29  ' GLUCOSE 294* 156* 133* 139* 197*  BUN 28* 20 19 21* 29*  CREATININE 1.18 0.76 0.73 0.74 0.81  CALCIUM 8.7* 8.8* 8.8* 9.3 9.6   CBC:  Recent Labs Lab 02/13/15 1736  02/14/15 0421 02/15/15 0352 02/16/15 0358 02/17/15 0514 02/18/15 0403  WBC 12.0*  < > 10.0 17.4* 11.1* 11.7* 11.3*  NEUTROABS 8.2*  --   --   --   --   --   --   HGB 12.2*  < > 10.9* 10.8* 10.7* 11.1* 11.5*  HCT 38.3*  < > 34.9* 34.4* 34.6* 35.9* 36.5*  MCV 87.6  < > 86.4 87.1 87.2 87.3 85.9  PLT 190  < > 181 199 211 228 261  < > = values in this interval not displayed. CBG:  Recent Labs Lab 02/16/15 2209 02/17/15 0738 02/17/15 1222 02/17/15 1711 02/17/15 2208  GLUCAP 280* 112* 197* 216* 186*    Recent Results (from the past 240 hour(s))  MRSA PCR Screening     Status: None   Collection Time: 02/13/15 11:37 PM  Result Value Ref Range Status   MRSA by PCR NEGATIVE NEGATIVE Final    Scheduled Meds: . ceFEPime IV  1 g Intravenous 3 times per day  . cloZAPine  100 mg Oral BID WC  . cloZAPine  400 mg Oral QHS  . donepezil  5 mg Oral QHS  . enoxaparin  injection  40 mg Subcutaneous QHS  . gabapentin  300 mg Oral TID  . haloperidol  10 mg Oral QHS  . hydrochlorothiazide  25 mg Oral Daily  . insulin aspart  0-5 Units Subcutaneous QHS  . insulin aspart  0-9 Units Subcutaneous TID WC  . lactulose  30 g Oral BID  . metoprolol tartrate  25 mg Oral BID  . senna  1 tablet Oral BID  . simvastatin  5 mg Oral QHS  . trihexyphenidyl  5 mg Oral BID WC  . vancomycin  1,000 mg Intravenous Q12H   Continuous Infusions:

## 2015-02-18 NOTE — Progress Notes (Signed)
CSW continuing to follow.   Pt admitted from [place] [facility] and plans to return upon discharge.  CSW spoke with [place] and confirmed pt can return to facility with oxygen.   Per MD, pt not yet medically ready for discharge today and anticipate discharge tomorrow.  CSW met with pt at bedside to provide support and update regarding anticipation for discharge tomorrow.   CSW to continue to follow.  Alison Murray, MSW, [place] [facility] [phone]

## 2015-02-18 NOTE — Progress Notes (Signed)
Physical Therapy Treatment Patient Details Name: Craig Bentley MRN: [id number] DOB: 1955-12-16 Today's Date: 02/18/2015    History of Present Illness 59 y.o. male with h/o COPD, HTN, schizophrenia,NIDDM admitted with PNA, sepsis.     PT Comments    Pt in bed on 3 lts sats at rest 90%.  Removed O2 at rest 88%.  With amb to bathroom decreased to 84%.  Amb used a walker in hallway on 3 lts to achieve sats 90%.   SATURATION QUALIFICATIONS: (This note is used to comply with regulatory documentation for home oxygen)  Patient Saturations on Room Air at Rest = 88%  Patient Saturations on Room Air while Ambulating = 84%  Patient Saturations on 3 Liters of oxygen while Ambulating = 90%  Please briefly explain why patient needs home oxygen:  To achieve therapeutic level   Follow Up Recommendations  No PT follow up  return to ALF but will need Oxygen    Equipment Recommendations  Cane  (pt requested a cane vs a walker)    Recommendations for Other Services       Precautions / Restrictions Precautions Precautions: Fall Precaution Comments: reports 2-3 falls this past year 2* dizziness from schizophrenia meds per pt; monitor O2 Restrictions Weight Bearing Restrictions: No    Mobility  Bed Mobility Overal bed mobility: Needs Assistance Bed Mobility: Supine to Sit     Supine to sit: Supervision     General bed mobility comments: increased time  Transfers Overall transfer level: Needs assistance Equipment used: Rolling walker (2 wheeled) Transfers: Sit to/from Stand Sit to Stand: Supervision         General transfer comment: verbal cues for hand placement.  assisted in bathroom  Ambulation/Gait Ambulation/Gait assistance: Supervision;Min guard Ambulation Distance (Feet): 185 Feet Assistive device: Rolling walker (2 wheeled)   Gait velocity: decreased   General Gait Details: Assisted with amb to bathroom and in hallway.  RA decreased to 84% with activity.   Reapplied 3 lts to achieve 90%.  Amb with out RW pt demonstrated increased unsteadiness.     Stairs            Wheelchair Mobility    Modified Rankin (Stroke Patients Only)       Balance                                    Cognition Arousal/Alertness: Awake/alert Behavior During Therapy: WFL for tasks assessed/performed Overall Cognitive Status: Within Functional Limits for tasks assessed                      Exercises      General Comments        Pertinent Vitals/Pain Pain Assessment: No/denies pain    Home Living                      Prior Function            PT Goals (current goals can now be found in the care plan section) Progress towards PT goals: Progressing toward goals    Frequency  Min 3X/week    PT Plan      Co-evaluation             End of Session Equipment Utilized During Treatment: Gait belt;Oxygen Activity Tolerance: Patient tolerated treatment well Patient left: in chair;with call bell/phone within reach;with chair alarm set  Time: 0935-1000 PT Time Calculation (min) (ACUTE ONLY): 25 min  Charges:  $Gait Training: 8-22 mins $Therapeutic Activity: 8-22 mins                    G Codes:      Rica Koyanagi  PTA [facility]  Acute  Rehab Pager      [phone]

## 2015-02-19 ENCOUNTER — Inpatient Hospital Stay (HOSPITAL_COMMUNITY): Payer: Medicaid Other

## 2015-02-19 LAB — CBC
HEMATOCRIT: 38 % — AB (ref 39.0–52.0)
Hemoglobin: 12 g/dL — ABNORMAL LOW (ref 13.0–17.0)
MCH: 27 pg (ref 26.0–34.0)
MCHC: 31.6 g/dL (ref 30.0–36.0)
MCV: 85.6 fL (ref 78.0–100.0)
PLATELETS: 296 10*3/uL (ref 150–400)
RBC: 4.44 MIL/uL (ref 4.22–5.81)
RDW: 12.7 % (ref 11.5–15.5)
WBC: 13.1 10*3/uL — AB (ref 4.0–10.5)

## 2015-02-19 LAB — BASIC METABOLIC PANEL
ANION GAP: 11 (ref 5–15)
BUN: 36 mg/dL — AB (ref 6–20)
CHLORIDE: 95 mmol/L — AB (ref 101–111)
CO2: 29 mmol/L (ref 22–32)
Calcium: 9.5 mg/dL (ref 8.9–10.3)
Creatinine, Ser: 0.79 mg/dL (ref 0.61–1.24)
Glucose, Bld: 138 mg/dL — ABNORMAL HIGH (ref 65–99)
POTASSIUM: 3.9 mmol/L (ref 3.5–5.1)
Sodium: 135 mmol/L (ref 135–145)

## 2015-02-19 LAB — GLUCOSE, CAPILLARY
GLUCOSE-CAPILLARY: 155 mg/dL — AB (ref 65–99)
GLUCOSE-CAPILLARY: 170 mg/dL — AB (ref 65–99)
Glucose-Capillary: 109 mg/dL — ABNORMAL HIGH (ref 65–99)
Glucose-Capillary: 221 mg/dL — ABNORMAL HIGH (ref 65–99)

## 2015-02-19 MED ORDER — ALBUTEROL SULFATE (2.5 MG/3ML) 0.083% IN NEBU: 2.5000 mg | INHALATION_SOLUTION | RESPIRATORY_TRACT | Status: DC | PRN

## 2015-02-19 MED ORDER — IPRATROPIUM-ALBUTEROL 0.5-2.5 (3) MG/3ML IN SOLN: 3.0000 mL | Freq: Three times a day (TID) | RESPIRATORY_TRACT | Status: DC

## 2015-02-19 MED ORDER — PREDNISONE 10 MG PO TABS: ORAL_TABLET | ORAL | Status: DC

## 2015-02-19 MED ORDER — HYDROCODONE-ACETAMINOPHEN 5-325 MG PO TABS: 1.0000 | ORAL_TABLET | ORAL | Status: DC | PRN

## 2015-02-19 MED ORDER — LEVOFLOXACIN 500 MG PO TABS
500.0000 mg | ORAL_TABLET | Freq: Every day | ORAL | Status: DC
Start: 2015-02-19 — End: 2015-04-02

## 2015-02-19 MED ORDER — PREDNISONE 20 MG PO TABS
40.0000 mg | ORAL_TABLET | Freq: Every day | ORAL | Status: DC
  Administered 2015-02-20: 40 mg via ORAL
  Filled 2015-02-19: qty 2

## 2015-02-19 NOTE — Progress Notes (Signed)
Patient ID: Craig Bentley, male   DOB: Jan 07, 1956, 59 y.o.   MRN: [id number]  [facility] PROGRESS NOTE  Craig Bentley SMO:[id number] DOB: 1955-09-15 DOA: 02/13/2015 PCP: Pcp Not In System   Brief narrative:    59 y.o. male with known chizophrenia on clozapine, HTN, COPD, and NIDDM who presents with cough for four days associated with weakness, falls, fever, and chest tightness.  In the ED, the patient was febrile with T 101.65F, tachycardic, had leukocytosis and required supplemental oxygen by oxymask to bring his SpO2 to >90%. [name] were asked to admit for sepsis from pneumonia.  Assessment/Plan:    Principal Problem:   Acute respiratory failure with hypoxia (HCC) - Suspect this is related to pneumonia, viral versus bacterial, imposed on acute COPD exacerbation - CT chest w/o contrast confirmed in bilateral lower lobes and left upper lobe, ? Aspiration as well  - Continued broad-spectrum antibiotics, today is vancomycin and cefepime day #5, transitioned to oral ABX Levaquin 12/26 - continue with prednisone taper  - Keep on oxygen to maintain saturations above 92% - repeat CXR today for follow up as pt says he feels more dyspneic   Active Problems:   Sepsis due to bilateral lower lobes and LUL pneumonia ([place]) - Patient met criteria for sepsis on admission, suspect pneumonia as a source but not evident on chest x-ray - Continue current antibiotics as noted above - follow up on CXR     Acute COPD (chronic obstructive pulmonary disease) ([place]) - less wheezing this am but still present mostly with expiration  - continue BD's as needed and scheduled  - taper down Prednisone     Diabetes mellitus with neurologic complication, without long-term current use of insulin ([place]) - Continue with sliding scale insulin while inpatient - Continue gabapentin    Essential hypertension - Continue home medical regimen, HCTZ and metoprolol - Continue to hold lisinopril for now - added  hydralazine as needed for better BP control   DVT prophylaxis - Lovenox SQ  Code Status: Full.  Family Communication:  plan of care discussed with the patient Disposition Plan: possibly by 12/29 to ALF if stable on Prednisone and no further wheezing   IV access:  Peripheral IV  Procedures and diagnostic studies:    Dg Chest 2 View 02/13/2015 No focal consolidation.   Ct Chest Wo Contrast 02/14/2015  Patchy areas of airspace consolidation in the dependent portions of the bilateral lower lobes and left upper lobe, likely infectious or inflammatory. The dependent distribution of the process suggests the possibility of aspiration pneumonia. 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 Follow-up with adrenal protocol abdominal CT may be considered if found clinically necessary.   Medical Consultants:  None   Other Consultants:  None   IAnti-Infectives:   Vancomycin 12/22  --> 12/26 Cefepime 12/22 --> 12/26 Levaquin 12/26 -->  Craig Ramsay, MD  [name] Pager [phone]  If 7PM-7AM, please contact night-coverage [email or web address] Password [name] 02/19/2015, 12:00 PM   LOS: 6 days   HPI/Subjective: No events overnight. Still with dyspnea and says he feels worse this AM.   Objective: Filed Vitals:   02/18/15 2025 02/18/15 2043 02/19/15 0527 02/19/15 0915  BP: 144/68  130/64   Pulse: 75  80 86  Temp: 98.1 F (36.7 C)  98.4 F (36.9 C)   TempSrc: Oral  Oral   Resp:   18 18  Height:      Weight:      SpO2: 96% 95% 97% 96%  Intake/Output Summary (Last 24 hours) at 02/19/15 1200 Last data filed at 02/19/15 0528  Gross per 24 hour  Intake      0 ml  Output   1050 ml  Net  -1050 ml    Exam:   General:  Pt is alert, not in acute distress  Cardiovascular: Regular rate and rhythm, S1/S2, no murmurs, no rubs, no gallops  Respiratory: exp wheezing mostly in upper and middle lobe improved   Abdomen: Soft, non tender, non distended, bowel sounds  present, no guarding  Extremities: pulses DP and PT palpable bilaterally  Neuro: Grossly nonfocal  Data Reviewed: Basic Metabolic Panel:  Recent Labs Lab 02/15/15 0352 02/16/15 0358 02/17/15 0514 02/18/15 0403 02/19/15 0459  NA 138 138 138 137 135  K 4.9 4.2 4.0 4.0 3.9  CL 103 100* 98* 97* 95*  CO2 '28 29 29 29 29  ' GLUCOSE 156* 133* 139* 197* 138*  BUN 20 19 21* 29* 36*  CREATININE 0.76 0.73 0.74 0.81 0.79  CALCIUM 8.8* 8.8* 9.3 9.6 9.5   CBC:  Recent Labs Lab 02/13/15 1736  02/15/15 0352 02/16/15 0358 02/17/15 0514 02/18/15 0403 02/19/15 0459  WBC 12.0*  < > 17.4* 11.1* 11.7* 11.3* 13.1*  NEUTROABS 8.2*  --   --   --   --   --   --   HGB 12.2*  < > 10.8* 10.7* 11.1* 11.5* 12.0*  HCT 38.3*  < > 34.4* 34.6* 35.9* 36.5* 38.0*  MCV 87.6  < > 87.1 87.2 87.3 85.9 85.6  PLT 190  < > 199 211 228 261 296  < > = values in this interval not displayed. CBG:  Recent Labs Lab 02/18/15 0748 02/18/15 1204 02/18/15 1705 02/18/15 2141 02/19/15 0727  GLUCAP 173* 148* 176* 186* 109*    Recent Results (from the past 240 hour(s))  MRSA PCR Screening     Status: None   Collection Time: 02/13/15 11:37 PM  Result Value Ref Range Status   MRSA by PCR NEGATIVE NEGATIVE Final    Scheduled Meds: . ceFEPime IV  1 g Intravenous 3 times per day  . cloZAPine  100 mg Oral BID WC  . cloZAPine  400 mg Oral QHS  . donepezil  5 mg Oral QHS  . enoxaparin  injection  40 mg Subcutaneous QHS  . gabapentin  300 mg Oral TID  . haloperidol  10 mg Oral QHS  . hydrochlorothiazide  25 mg Oral Daily  . insulin aspart  0-5 Units Subcutaneous QHS  . insulin aspart  0-9 Units Subcutaneous TID WC  . lactulose  30 g Oral BID  . metoprolol tartrate  25 mg Oral BID  . senna  1 tablet Oral BID  . simvastatin  5 mg Oral QHS  . trihexyphenidyl  5 mg Oral BID WC  . vancomycin  1,000 mg Intravenous Q12H   Continuous Infusions:

## 2015-02-19 NOTE — Discharge Instructions (Signed)

## 2015-02-20 ENCOUNTER — Encounter (HOSPITAL_COMMUNITY): Payer: Self-pay | Admitting: Radiology

## 2015-02-20 ENCOUNTER — Inpatient Hospital Stay (HOSPITAL_COMMUNITY): Payer: Medicaid Other

## 2015-02-20 DIAGNOSIS — K229 Disease of esophagus, unspecified: Secondary | ICD-10-CM | POA: Insufficient documentation

## 2015-02-20 DIAGNOSIS — J441 Chronic obstructive pulmonary disease with (acute) exacerbation: Secondary | ICD-10-CM | POA: Insufficient documentation

## 2015-02-20 DIAGNOSIS — F2089 Other schizophrenia: Secondary | ICD-10-CM

## 2015-02-20 DIAGNOSIS — J189 Pneumonia, unspecified organism: Secondary | ICD-10-CM

## 2015-02-20 LAB — CBC
HEMATOCRIT: 35.3 % — AB (ref 39.0–52.0)
HEMOGLOBIN: 11.2 g/dL — AB (ref 13.0–17.0)
MCH: 27.4 pg (ref 26.0–34.0)
MCHC: 31.7 g/dL (ref 30.0–36.0)
MCV: 86.3 fL (ref 78.0–100.0)
Platelets: 317 10*3/uL (ref 150–400)
RBC: 4.09 MIL/uL — AB (ref 4.22–5.81)
RDW: 12.7 % (ref 11.5–15.5)
WBC: 11.1 10*3/uL — ABNORMAL HIGH (ref 4.0–10.5)

## 2015-02-20 LAB — BASIC METABOLIC PANEL
ANION GAP: 10 (ref 5–15)
BUN: 31 mg/dL — ABNORMAL HIGH (ref 6–20)
CALCIUM: 9.5 mg/dL (ref 8.9–10.3)
CHLORIDE: 97 mmol/L — AB (ref 101–111)
CO2: 32 mmol/L (ref 22–32)
CREATININE: 0.81 mg/dL (ref 0.61–1.24)
GFR calc non Af Amer: >60 mL/min (ref >60)
Glucose, Bld: 114 mg/dL — ABNORMAL HIGH (ref 65–99)
Potassium: 3.9 mmol/L (ref 3.5–5.1)
SODIUM: 139 mmol/L (ref 135–145)

## 2015-02-20 LAB — GLUCOSE, CAPILLARY
GLUCOSE-CAPILLARY: 193 mg/dL — AB (ref 65–99)
GLUCOSE-CAPILLARY: 88 mg/dL (ref 65–99)
GLUCOSE-CAPILLARY: 273 mg/dL — AB (ref 65–99)
Glucose-Capillary: 191 mg/dL — ABNORMAL HIGH (ref 65–99)

## 2015-02-20 MED ORDER — PANTOPRAZOLE SODIUM 40 MG PO TBEC: 40.0000 mg | DELAYED_RELEASE_TABLET | Freq: Every day | ORAL | Status: AC

## 2015-02-20 MED ORDER — PANTOPRAZOLE SODIUM 40 MG PO TBEC
40.0000 mg | DELAYED_RELEASE_TABLET | Freq: Two times a day (BID) | ORAL | Status: DC
  Administered 2015-02-20: 40 mg via ORAL
  Filled 2015-02-20: qty 1

## 2015-02-20 NOTE — Progress Notes (Signed)
Pt for discharge back to [facility].  CSW facilitated pt discharge needs including contacting facility, faxing pt discharge information to [facility], confirming facility received and reviewed discharge information, providing RN phone number to call report, discussing with pt at bedside, and arranging ambulance transport to [facility].   CSW confirmed with [facility] that O2 and pt rolling walker had been delivered. [facility] states that facility will arrange home health to follow.  No further social work needs identified at this time.  CSW signing off.   Alison Murray, MSW, [facility] [phone]

## 2015-02-20 NOTE — Plan of Care (Signed)
Problem: Phase II Progression Outcomes Goal: O2 sats > equal to 90% on RA or at baseline Outcome: Completed/Met Date Met:  02/20/15 Patient will be discharged with Oxygen

## 2015-02-20 NOTE — Discharge Summary (Addendum)
Physician Discharge Summary  Craig Bentley VOZ:[id number] DOB: 1955-03-30 DOA: 02/13/2015  PCP: Provider at the ALF, pt does not know the name   Admit date: 02/13/2015 Discharge date: 02/20/2015  Recommendations for Outpatient Follow-up:  1. Pt will need to follow up with PCP in 1-2 weeks post discharge 2. Please obtain BMP to evaluate electrolytes and kidney function 3. Please also check CBC to evaluate Hg and Hct levels 4. Please note that Lisinopril was stopped until cough resolves 5. Pt also started on scheduled and as needed bronchodilators, once his dyspnea improves, consider discontinuing scheduled duoneb ( in 1 week or so as clinically indicated) 6. Pt also to complete 5 more days of Levaquin and prednisone taper as outlined below 7. Added Protonix to the regimen to help with acid reflux as it was thought that some of the respiratory issues also due to underlying acid reflux   Discharge Diagnoses:  Principal Problem:   Acute respiratory failure with hypoxia (HCC) Active Problems:   Sepsis due to pneumonia (HCC)   COPD (chronic obstructive pulmonary disease) (HCC)   Diabetes mellitus with neurologic complication, without long-term current use of insulin (HCC)   Essential hypertension   Dyspnea   Respiratory distress  Discharge Condition: Stable  Diet recommendation: as tolerated     Brief narrative:    59 y.o. male with known chizophrenia on clozapine, HTN, COPD, and NIDDM who presents with cough for four days associated with weakness, falls, fever, and chest tightness.  In the ED, the patient was febrile with T 101.75F, tachycardic, had leukocytosis and required supplemental oxygen by oxymask to bring his SpO2 to >90%. [name] were asked to admit for sepsis from pneumonia.  Assessment/Plan:    Principal Problem:  Acute respiratory failure with hypoxia (HCC) - Suspect this is related to pneumonia, viral versus bacterial, imposed on acute COPD exacerbation - CT  chest w/o contrast confirmed in bilateral lower lobes and left upper lobe, ? Aspiration as well  - Continued broad-spectrum antibiotics and transitioned to Levaquin  - taper prednisone  - Keep on oxygen to maintain saturations above 92%  Active Problems:  Sepsis due to bilateral lower lobes and LUL pneumonia (HCC) - Patient met criteria for sepsis on admission, suspect pneumonia as a source but not evident on chest x-ray - resolved  - Continue current antibiotics as noted above   Acute COPD (chronic obstructive pulmonary disease) (HCC) - less wheezing this am but still present mostly with expiration  - continue BD's as needed and scheduled, complete Levaquin treatment  - taper down Prednisone    Diabetes mellitus with neurologic complication, without long-term current use of insulin (HCC) - Continue with home medical regimen  - Continue gabapentin   Essential hypertension - Continue home medical regimen, HCTZ and metoprolol - Continue to hold lisinopril for now  Code Status: Full.  Family Communication: plan of care discussed with the patient Disposition Plan: ALF today   IV access:  Peripheral IV  Procedures and diagnostic studies:   Dg Chest 2 View 02/13/2015 No focal consolidation.   Ct Chest Wo Contrast 02/14/2015 Patchy areas of airspace consolidation in the dependent portions of the bilateral lower lobes and left upper lobe, likely infectious or inflammatory. The dependent distribution of the process suggests the possibility of aspiration pneumonia. 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 Follow-up with adrenal protocol abdominal CT may be considered if found clinically necessary.   Medical Consultants:  PCCM  Other Consultants:  None   IAnti-Infectives:  Vancomycin 12/22 --> 12/26 Cefepime 12/22 --> 12/26 Levaquin 12/26 --> 5 more days post discharge         Discharge Exam: Filed Vitals:   02/19/15  2129 02/20/15 0435  BP: 147/70 130/63  Pulse: 73 71  Temp: 98.1 F (36.7 C) 98.7 F (37.1 C)  Resp:  18   Filed Vitals:   02/19/15 2027 02/19/15 2039 02/19/15 2129 02/20/15 0435  BP:  137/68 147/70 130/63  Pulse:  67 73 71  Temp:  97.9 F (36.6 C) 98.1 F (36.7 C) 98.7 F (37.1 C)  TempSrc:  Oral Oral Oral  Resp:  18  18  Height:      Weight:      SpO2: 94% 98% 94% 100%    General: Pt is alert, follows commands appropriately, not in acute distress Cardiovascular: Regular rate and rhythm, no rubs, no gallops Respiratory: Clear to auscultation bilaterally, mild upper left lobe wheezing  Abdominal: Soft, non tender, non distended, bowel sounds +, no guarding  Discharge Instructions  Discharge Instructions    Diet - low sodium heart healthy    Complete by:  As directed      Increase activity slowly    Complete by:  As directed             Medication List    STOP taking these medications        lisinopril 20 MG tablet  Commonly known as:  PRINIVIL,ZESTRIL      TAKE these medications        acetaminophen 325 MG tablet  Commonly known as:  TYLENOL  Take 650 mg by mouth every 6 (six) hours as needed for mild pain, fever or headache.     albuterol 108 (90 Base) MCG/ACT inhaler  Commonly known as:  PROVENTIL HFA;VENTOLIN HFA  Inhale 2 puffs into the lungs every 4 (four) hours as needed for wheezing or shortness of breath.     albuterol (2.5 MG/3ML) 0.083% nebulizer solution  Commonly known as:  PROVENTIL  Take 3 mLs (2.5 mg total) by nebulization every 4 (four) hours as needed for wheezing.     cloZAPine 100 MG tablet  Commonly known as:  CLOZARIL  Take 100-400 mg by mouth 3 (three) times daily. Takes 1 tablet every morning and afternoon Takes 4 tablets at bedtime     dicyclomine 20 MG tablet  Commonly known as:  BENTYL  Take 20 mg by mouth 2 (two) times daily as needed for spasms.     donepezil 5 MG tablet  Commonly known as:  ARICEPT  Take 5 mg by mouth  at bedtime.     gabapentin 300 MG capsule  Commonly known as:  NEURONTIN  Take 300 mg by mouth 3 (three) times daily.     guaifenesin 100 MG/5ML syrup  Commonly known as:  ROBITUSSIN  Take 200 mg by mouth 4 (four) times daily as needed for cough.     haloperidol 10 MG tablet  Commonly known as:  HALDOL  Take 10 mg by mouth at bedtime.     hydrochlorothiazide 25 MG tablet  Commonly known as:  HYDRODIURIL  Take 25 mg by mouth daily.     HYDROcodone-acetaminophen 5-325 MG tablet  Commonly known as:  NORCO/VICODIN  Take 1-2 tablets by mouth every 4 (four) hours as needed for moderate pain.     ipratropium-albuterol 0.5-2.5 (3) MG/3ML Soln  Commonly known as:  DUONEB  Take 3 mLs by nebulization 3 (three) times daily.  lactulose 10 GM/15ML solution  Commonly known as:  CHRONULAC  Take 30 g by mouth 2 (two) times daily.     levofloxacin 500 MG tablet  Commonly known as:  LEVAQUIN  Take 1 tablet (500 mg total) by mouth daily.     metFORMIN 500 MG tablet  Commonly known as:  GLUCOPHAGE  Take 500 mg by mouth 2 (two) times daily with a meal.     metoprolol tartrate 25 MG tablet  Commonly known as:  LOPRESSOR  Take 25 mg by mouth 2 (two) times daily.     multivitamin with minerals Tabs tablet  Take 1 tablet by mouth daily.     pantoprazole 40 MG tablet  Commonly known as:  PROTONIX  Take 1 tablet (40 mg total) by mouth daily.     polyethylene glycol packet  Commonly known as:  MIRALAX / GLYCOLAX  Take 17 g by mouth daily as needed for mild constipation or moderate constipation.     predniSONE 10 MG tablet  Commonly known as:  DELTASONE  Take 40 mg tablet and taper down by 10 mg daily until completed     senna 8.6 MG Tabs tablet  Commonly known as:  SENOKOT  Take 1 tablet by mouth 2 (two) times daily.     simvastatin 5 MG tablet  Commonly known as:  ZOCOR  Take 5 mg by mouth at bedtime.     trihexyphenidyl 5 MG tablet  Commonly known as:  ARTANE  Take 5 mg by  mouth 2 (two) times daily with a meal.             Follow-up Information    Follow up with [facility].   Why:  Home 02, home rw.   Contact information:   [address] [place] [address] [phone]       Call Debbora Presto, MD.   Specialty:  Internal Medicine   Why:  As needed call my cell phone [phone]   Contact information:   [address] [place] [address] [phone]        The results of significant diagnostics from this hospitalization (including imaging, microbiology, ancillary and laboratory) are listed below for reference.     Microbiology: Recent Results (from the past 240 hour(s))  Culture, blood (Routine X 2) w Reflex to ID Panel     Status: None   Collection Time: 02/13/15  6:00 PM  Result Value Ref Range Status   Specimen Description BLOOD LEFT HAND  Final   Special Requests BOTTLES DRAWN AEROBIC AND ANAEROBIC 10ML,5ML  Final   Culture   Final    NO GROWTH 5 DAYS Performed at [facility]    Report Status 02/18/2015 FINAL  Final  Culture, blood (Routine X 2) w Reflex to ID Panel     Status: None   Collection Time: 02/13/15  6:20 PM  Result Value Ref Range Status   Specimen Description BLOOD LEFT FOREARM  Final   Special Requests BOTTLES DRAWN AEROBIC AND ANAEROBIC 5CC EACH  Final   Culture  Setup Time   Final    GRAM POSITIVE COCCI IN CLUSTERS IN BOTH AEROBIC AND ANAEROBIC BOTTLES CRITICAL RESULT CALLED TO, READ BACK BY AND VERIFIED WITH: A ASHLEY RN [id number] 02/14/15 A BROWNING    Culture   Final    STAPHYLOCOCCUS SPECIES (COAGULASE NEGATIVE) THE SIGNIFICANCE OF ISOLATING THIS ORGANISM FROM A SINGLE SET OF BLOOD CULTURES WHEN MULTIPLE SETS  ARE DRAWN IS UNCERTAIN. PLEASE NOTIFY THE MICROBIOLOGY DEPARTMENT WITHIN ONE WEEK IF SPECIATION AND SENSITIVITIES ARE REQUIRED. Performed at [facility]    Report Status 02/17/2015 FINAL  Final  MRSA PCR Screening     Status: None    Collection Time: 02/13/15 11:37 PM  Result Value Ref Range Status   MRSA by PCR NEGATIVE NEGATIVE Final    Comment:        The GeneXpert MRSA Assay (FDA approved for NASAL specimens only), is one component of a comprehensive MRSA colonization surveillance program. It is not intended to diagnose MRSA infection nor to guide or monitor treatment for MRSA infections.   Urine culture     Status: None   Collection Time: 02/14/15  2:44 AM  Result Value Ref Range Status   Specimen Description URINE, CLEAN CATCH  Final   Special Requests NONE  Final   Culture   Final    MULTIPLE SPECIES PRESENT, SUGGEST RECOLLECTION Performed at [facility]    Report Status 02/15/2015 FINAL  Final     Labs: Basic Metabolic Panel:  Recent Labs Lab 02/16/15 0358 02/17/15 0514 02/18/15 0403 02/19/15 0459 02/20/15 0520  NA 138 138 137 135 139  K 4.2 4.0 4.0 3.9 3.9  CL 100* 98* 97* 95* 97*  CO2 29 29 29 29  32  GLUCOSE 133* 139* 197* 138* 114*  BUN 19 21* 29* 36* 31*  CREATININE 0.73 0.74 0.81 0.79 0.81  CALCIUM 8.8* 9.3 9.6 9.5 9.5   CBC:  Recent Labs Lab 02/13/15 1736  02/16/15 0358 02/17/15 0514 02/18/15 0403 02/19/15 0459 02/20/15 0520  WBC 12.0*  < > 11.1* 11.7* 11.3* 13.1* 11.1*  NEUTROABS 8.2*  --   --   --   --   --   --   HGB 12.2*  < > 10.7* 11.1* 11.5* 12.0* 11.2*  HCT 38.3*  < > 34.6* 35.9* 36.5* 38.0* 35.3*  MCV 87.6  < > 87.2 87.3 85.9 85.6 86.3  PLT 190  < > 211 228 261 296 317  < > = values in this interval not displayed.  BNP (last 3 results)  Recent Labs  02/13/15 1752  BNP 5.9   CBG:  Recent Labs Lab 02/19/15 1203 02/19/15 1618 02/19/15 2030 02/19/15 2128 02/20/15 0750  GLUCAP 221* 193* 170* 155* 88   SIGNED: Time coordinating discharge: 30 minutes  MAGICK-Leniya Breit, MD  [facility] 02/20/2015, 10:51 AM Pager [phone]  If 7PM-7AM, please contact night-coverage [email or web address] Password [name]

## 2015-02-20 NOTE — NC FL2 (Signed)
[place] LEVEL OF CARE SCREENING TOOL     IDENTIFICATION  Patient Name: Craig Bentley Birthdate: April 20, 1955 Sex: male Admission Date (Current Location): 02/13/2015  [place] and [place] Number:  Craig Bentley [id number] [place] and Address:  [facility],  [place] [address], [place]      Provider Number: [id number]  Attending Physician Name and Address:  Theodis Blaze, MD  Relative Name and Phone Number:       Current Level of Care: Hospital Recommended Level of Care: [place] Prior Approval Number:    Date Approved/Denied:   PASRR Number:    Discharge Plan: Other (Comment) ([place])    Current Diagnoses: Patient Active Problem List   Diagnosis Date Noted  . Dyspnea   . Respiratory distress   . Sepsis due to pneumonia ([place]) 02/14/2015  . Acute respiratory failure with hypoxia ([place]) 02/13/2015  . Diabetes mellitus with neurologic complication, without long-term current use of insulin ([place]) 07/20/2011  . Essential hypertension   . COPD (chronic obstructive pulmonary disease) (HCC)     Orientation RESPIRATION BLADDER Height & Weight    Self, Time, Situation, Place  O2 (3L) Incontinent '6\' 1"'$  (185.4 cm) 182 lbs.  BEHAVIORAL SYMPTOMS/MOOD NEUROLOGICAL BOWEL NUTRITION STATUS   (No Behaviors)  (NONE) Continent Diet (Carb Modified)  AMBULATORY STATUS COMMUNICATION OF NEEDS Skin   Limited Assist Verbally Normal                       Personal Care Assistance Level of Assistance  Dressing, Bathing, Feeding Bathing Assistance: Limited assistance Feeding assistance: Independent Dressing Assistance: Limited assistance     Functional Limitations Info  Sight, Hearing, Speech Sight Info: Adequate Hearing Info: Adequate Speech Info: Adequate    SPECIAL CARE FACTORS FREQUENCY  PT (By licensed PT)     PT Frequency: 3 x weekly              Contractures Contractures Info: Not present     Additional Factors Info  Code Status Code Status Info: FULL code status   Psychotropic Info: Clozaril, Haldol Insulin Sliding Scale Info: No sliding scale insulin       Current Medications (02/20/2015):  This is the current hospital active medication list Current Facility-Administered Medications  Medication Dose Route Frequency Provider Last Rate Last Dose  . acetaminophen (TYLENOL) tablet 650 mg  650 mg Oral Q6H PRN Edwin Dada, MD   650 mg at 02/14/15 0436   Or  . acetaminophen (TYLENOL) suppository 650 mg  650 mg Rectal Q6H PRN Edwin Dada, MD      . albuterol (PROVENTIL) (2.5 MG/3ML) 0.083% nebulizer solution 2.5 mg  2.5 mg Nebulization Q2H PRN Edwin Dada, MD   2.5 mg at 02/16/15 0520  . cloZAPine (CLOZARIL) tablet 100 mg  100 mg Oral BID WC Edwin Dada, MD   100 mg at 02/20/15 0806  . cloZAPine (CLOZARIL) tablet 400 mg  400 mg Oral QHS Edwin Dada, MD   400 mg at 02/19/15 2328  . donepezil (ARICEPT) tablet 5 mg  5 mg Oral QHS Edwin Dada, MD   5 mg at 02/19/15 2329  . enoxaparin (LOVENOX) injection 40 mg  40 mg Subcutaneous QHS Edwin Dada, MD   40 mg at 02/19/15 2330  . gabapentin (NEURONTIN) capsule 300 mg  300 mg Oral TID Edwin Dada, MD   300 mg at 02/20/15 0957  . guaiFENesin (MUCINEX) 12  hr tablet 600 mg  600 mg Oral BID Theodis Blaze, MD   600 mg at 02/20/15 0956  . guaiFENesin (ROBITUSSIN) 100 MG/5ML solution 200 mg  200 mg Oral QID PRN Edwin Dada, MD      . haloperidol (HALDOL) tablet 10 mg  10 mg Oral QHS Edwin Dada, MD   10 mg at 02/19/15 2329  . hydrALAZINE (APRESOLINE) injection 5 mg  5 mg Intravenous Q4H PRN Theodis Blaze, MD      . hydrochlorothiazide (HYDRODIURIL) tablet 25 mg  25 mg Oral Daily Edwin Dada, MD   25 mg at 02/20/15 0958  . HYDROcodone-acetaminophen (NORCO/VICODIN) 5-325 MG per tablet 1-2 tablet  1-2 tablet Oral Q4H PRN Edwin Dada, MD   1 tablet at 02/16/15 0610  . insulin aspart (novoLOG) injection 0-5 Units  0-5 Units Subcutaneous QHS Edwin Dada, MD   3 Units at 02/16/15 2248  . insulin aspart (novoLOG) injection 0-9 Units  0-9 Units Subcutaneous TID WC Edwin Dada, MD   2 Units at 02/20/15 1227  . ipratropium-albuterol (DUONEB) 0.5-2.5 (3) MG/3ML nebulizer solution 3 mL  3 mL Nebulization TID Theodis Blaze, MD   3 mL at 02/20/15 0929  . lactulose (CHRONULAC) 10 GM/15ML solution 30 g  30 g Oral BID Edwin Dada, MD   30 g at 02/20/15 0957  . levofloxacin (LEVAQUIN) tablet 500 mg  500 mg Oral Daily Theodis Blaze, MD   500 mg at 02/20/15 0956  . metoprolol tartrate (LOPRESSOR) tablet 25 mg  25 mg Oral BID Edwin Dada, MD   25 mg at 02/20/15 0958  . ondansetron (ZOFRAN) tablet 4 mg  4 mg Oral Q6H PRN Edwin Dada, MD   4 mg at 02/14/15 1601   Or  . ondansetron (ZOFRAN) injection 4 mg  4 mg Intravenous Q6H PRN Edwin Dada, MD      . pantoprazole (PROTONIX) EC tablet 40 mg  40 mg Oral BID AC Erick Colace, NP      . predniSONE (DELTASONE) tablet 40 mg  40 mg Oral Q breakfast Theodis Blaze, MD   40 mg at 02/20/15 0958  . promethazine (PHENERGAN) injection 12.5-25 mg  12.5-25 mg Intravenous Q6H PRN Theodis Blaze, MD      . senna-docusate (Senokot-S) tablet 1 tablet  1 tablet Oral BID Theodis Blaze, MD   1 tablet at 02/20/15 [phone]  . simvastatin (ZOCOR) tablet 5 mg  5 mg Oral QHS Edwin Dada, MD   5 mg at 02/19/15 2326  . sodium chloride 0.9 % injection 3 mL  3 mL Intravenous Q12H Edwin Dada, MD   3 mL at 02/17/15 2249  . trihexyphenidyl (ARTANE) tablet 5 mg  5 mg Oral BID WC Edwin Dada, MD   5 mg at 02/20/15 1324     Discharge Medications: Please see discharge summary for a list of discharge medications.  Relevant Imaging Results:  Relevant Lab Results:   Additional Information SSN: [id number]; Home health PT eval and  treat at ALF  KIDD, Grayson A, LCSW

## 2015-02-20 NOTE — Consult Note (Signed)
Name: Craig Bentley MRN: [id number] DOB: 29-Aug-1955    ADMISSION DATE:  02/13/2015 CONSULTATION DATE:  12/29  REFERRING MD :  Doyle Askew   CHIEF COMPLAINT:  Dyspnea and abnormal CT chest   BRIEF PATIENT DESCRIPTION:  This is a 59 yom w/ sig h/o schizophrenia and DM. He lives in assisted living. Admitted on 12/23 w/ pneumonia (NOS). Clinically had improved but still had some exertional dyspnea and wheeze so repeat CT scan was obtained showing progression of basilar consolidation. PCCM was asked to review CT scan and evaluate pt re: concern about worsening PNA   SIGNIFICANT EVENTS    STUDIES:  12/23 CT chest: Patchy areas of airspace consolidation in the dependent portions of the bilateral lower lobes and left upper lobe, likely infectious or inflammatory. The dependent distribution of the process suggests the possibility of aspiration pneumonia. 12/29 CT chest: Interval increase in consolidative opacities within the lingula as well as the right and left lower lobes    HISTORY OF PRESENT ILLNESS:   This is a 59 yom w/ sig h/o schizophrenia and DM. He lives in assisted living. Admitted on 12/23 w/ cc: 3-4d h/o non productive cough, fever, wheeze, weakness resulting in falls, and chest tightness. His girlfriend had been diagnosed w/ pneumonia just prior to these symptoms. On ER evaluation he was febrile, PO2 was 56, and CXR suggested RLL airspace disease and subsequent CT chest showed findings worrisome for PNA so he was admitted. Placed on broad spec HCAP coverage, and cultures as well as urine strep, legionella AND influenza PCR were sent. All cultures and PCRs have been negative. Over the course of his treatment his fevers have subsided, his WBC ct has been stable and clinically he feels better, however he continues to have intermittent wheeze and exertional shortness of breath. Because of this a repeat CT scan was obtained which showed some progression of his basilar consolidative disease so PCCM  was asked to eval given concern that dyspnea was on-going and CT looked worse.   PAST MEDICAL HISTORY :   has a past medical history of Hypertension; Diabetes mellitus; Schizophrenia, schizo-affective ([place]); COPD (chronic obstructive pulmonary disease) ([place]); Constipation; and Fecal impaction ([place]).  has no past surgical history on file. Prior to Admission medications   Medication Sig Start Date End Date Taking? Authorizing Provider  albuterol (PROVENTIL HFA;VENTOLIN HFA) 108 (90 BASE) MCG/ACT inhaler Inhale 2 puffs into the lungs every 4 (four) hours as needed for wheezing or shortness of breath.    Yes Historical Provider, MD  cloZAPine (CLOZARIL) 100 MG tablet Take 100-400 mg by mouth 3 (three) times daily. Takes 1 tablet every morning and afternoon Takes 4 tablets at bedtime   Yes Historical Provider, MD  dicyclomine (BENTYL) 20 MG tablet Take 20 mg by mouth 2 (two) times daily as needed for spasms.   Yes Historical Provider, MD  donepezil (ARICEPT) 5 MG tablet Take 5 mg by mouth at bedtime.   Yes Historical Provider, MD  gabapentin (NEURONTIN) 300 MG capsule Take 300 mg by mouth 3 (three) times daily.   Yes Historical Provider, MD  haloperidol (HALDOL) 10 MG tablet Take 10 mg by mouth at bedtime.   Yes Historical Provider, MD  hydrochlorothiazide (HYDRODIURIL) 25 MG tablet Take 25 mg by mouth daily.   Yes Historical Provider, MD  lactulose (CHRONULAC) 10 GM/15ML solution Take 30 g by mouth 2 (two) times daily.   Yes Historical Provider, MD  lisinopril (PRINIVIL,ZESTRIL) 20 MG tablet Take 20 mg by mouth  daily.    Yes Historical Provider, MD  metFORMIN (GLUCOPHAGE) 500 MG tablet Take 500 mg by mouth 2 (two) times daily with a meal.   Yes Historical Provider, MD  metoprolol tartrate (LOPRESSOR) 25 MG tablet Take 25 mg by mouth 2 (two) times daily.   Yes Historical Provider, MD  Multiple Vitamin (MULITIVITAMIN WITH MINERALS) TABS Take 1 tablet by mouth daily.   Yes Historical Provider, MD    polyethylene glycol (MIRALAX / GLYCOLAX) packet Take 17 g by mouth daily as needed for mild constipation or moderate constipation.   Yes Historical Provider, MD  senna (SENOKOT) 8.6 MG TABS Take 1 tablet by mouth 2 (two) times daily.   Yes Historical Provider, MD  simvastatin (ZOCOR) 5 MG tablet Take 5 mg by mouth at bedtime.   Yes Historical Provider, MD  trihexyphenidyl (ARTANE) 5 MG tablet Take 5 mg by mouth 2 (two) times daily with a meal.   Yes Historical Provider, MD  acetaminophen (TYLENOL) 325 MG tablet Take 650 mg by mouth every 6 (six) hours as needed for mild pain, fever or headache.     Historical Provider, MD  albuterol (PROVENTIL) (2.5 MG/3ML) 0.083% nebulizer solution Take 3 mLs (2.5 mg total) by nebulization every 4 (four) hours as needed for wheezing. 02/19/15   Theodis Blaze, MD  guaifenesin (ROBITUSSIN) 100 MG/5ML syrup Take 200 mg by mouth 4 (four) times daily as needed for cough.    Historical Provider, MD  HYDROcodone-acetaminophen (NORCO/VICODIN) 5-325 MG tablet Take 1-2 tablets by mouth every 4 (four) hours as needed for moderate pain. 02/19/15   Theodis Blaze, MD  ipratropium-albuterol (DUONEB) 0.5-2.5 (3) MG/3ML SOLN Take 3 mLs by nebulization 3 (three) times daily. 02/19/15   Theodis Blaze, MD  levofloxacin (LEVAQUIN) 500 MG tablet Take 1 tablet (500 mg total) by mouth daily. 02/19/15   Theodis Blaze, MD  predniSONE (DELTASONE) 10 MG tablet Take 40 mg tablet and taper down by 10 mg daily until completed 02/19/15   Theodis Blaze, MD   No Known Allergies  FAMILY HISTORY:  family history includes Mental illness in his mother. SOCIAL HISTORY:  reports that he has been smoking Cigarettes.  He has been smoking about 1.50 packs per day. He does not have any smokeless tobacco history on file. He reports that he does not drink alcohol or use illicit drugs.  REVIEW OF SYSTEMS:   Constitutional: now Negative for fever, chills, weight loss, malaise/fatigue and diaphoresis.   HENT: Negative for hearing loss, ear pain, nosebleeds, congestion, sore throat, neck pain, tinnitus and ear discharge.   Eyes: Negative for blurred vision, double vision, photophobia, pain, discharge and redness.  Respiratory: has chronic non-productive cough, no hemoptysis, no sputum production, shortness of breath at baseline w/ exertion. Michela Pitcher what drove him to come to the hospital was that he was short of breath singing, has chronic wheezing/ "wheezleing" and no  stridor.   Cardiovascular: Negative for chest pain, palpitations, orthopnea, claudication, leg swelling and PND.  Gastrointestinal: Negative for heartburn, nausea, vomiting, abdominal pain, diarrhea, constipation, blood in stool and melena.  Genitourinary: Negative for dysuria, urgency, frequency, hematuria and flank pain.  Musculoskeletal: Negative for myalgias, back pain, joint pain and falls.  Skin: Negative for itching and rash.  Neurological: Negative for dizziness, tingling, tremors, sensory change, speech change, focal weakness, seizures, loss of consciousness, weakness and headaches.  Endo/Heme/Allergies: Negative for environmental allergies and polydipsia. Does not bruise/bleed easily.  SUBJECTIVE:  Feels much better  VITAL SIGNS: Temp:  [97.9 F (36.6 C)-98.7 F (37.1 C)] 98.7 F (37.1 C) (12/29 0435) Pulse Rate:  [67-82] 71 (12/29 0435) Resp:  [18-20] 18 (12/29 0435) BP: (130-147)/(63-73) 130/63 mmHg (12/29 0435) SpO2:  [93 %-100 %] 100 % (12/29 0435)  PHYSICAL EXAMINATION: General:  59 year old male, resting comfortably in bed. He is in no distress.  Neuro:  Awake, oriented, no focal def  HEENT:  NCAT, edentulous, + upper airway wheeze noted.  Cardiovascular:  Rrr, no MRG Lungs:  Essentially clear w/ occ wheeze. No accessory muscle use  Abdomen:  Soft, not tender, no OM  Musculoskeletal:  Equal strength and bulk  Skin:  Warm, dry and intact    Recent Labs Lab 02/18/15 0403 02/19/15 0459 02/20/15 0520   NA 137 135 139  K 4.0 3.9 3.9  CL 97* 95* 97*  CO2 29 29 32  BUN 29* 36* 31*  CREATININE 0.81 0.79 0.81  GLUCOSE 197* 138* 114*    Recent Labs Lab 02/18/15 0403 02/19/15 0459 02/20/15 0520  HGB 11.5* 12.0* 11.2*  HCT 36.5* 38.0* 35.3*  WBC 11.3* 13.1* 11.1*  PLT 261 296 317   Dg Chest 2 View  02/19/2015  CLINICAL DATA:  Dyspnea. EXAM: CHEST  2 VIEW COMPARISON:  02/14/2015 and 02/13/2015 FINDINGS: Densities at the left lung base. New linear density in the right perihilar region. Heart size is within normal limits. Slightly prominent lung markings are similar to the previous examination. Difficult to exclude a small left pleural effusion. IMPRESSION: Left basilar chest densities are suggestive for atelectasis and question a small amount of left pleural fluid. Probable atelectasis in the right perihilar region. Electronically Signed   By: Markus Daft M.D.   On: 02/19/2015 13:13   Ct Chest Wo Contrast  02/20/2015  CLINICAL DATA:  Patient with history of pneumonia. EXAM: CT CHEST WITHOUT CONTRAST TECHNIQUE: Multidetector CT imaging of the chest was performed following the standard protocol without IV contrast. COMPARISON:  CT chest 02/14/2015 FINDINGS: Mediastinum/Nodes: Visualized thyroid is unremarkable. Normal heart size. Coronary arterial vascular calcifications. No pericardial effusion. No enlarged axillary, mediastinal or hilar lymphadenopathy. Debris is demonstrated within the esophagus which is patulous. Lungs/Pleura: The central airways are patent. Interval increase in patchy areas of consolidation within the right and left lower lobes as well as within the lingula. No definite pleural effusion or pneumothorax. Multiple scattered cysts are demonstrated throughout the lungs bilaterally. Upper abdomen: Stable small bilateral adrenal masses compatible with adenomas measuring up to 2.3 cm in the left adrenal gland. Gallbladder is decompressed. Musculoskeletal: No aggressive or acute  appearing osseous lesions. IMPRESSION: Interval increase in consolidative opacities within the lingula as well as the right and left lower lobes concerning for pneumonia in the appropriate clinical setting. Continued radiographic follow-up is recommended to ensure complete resolution. Debris is demonstrated within a mildly patulous esophagus, raising the possibility of reflux. Electronically Signed   By: Lovey Newcomer M.D.   On: 02/20/2015 11:16    ASSESSMENT / PLAN: Pneumonia/HCAP (NOS). Clinically better. Suspect that CT findings reflect expected lag behind clinical improvement and not disease progression. He has significant upper airway wheeze and given CT findings re: the esophagus wonder about chronic reflux and aspiration.  Plan/rec Continue current course of abx-->no need to escalate therapy Would adhere to reflux precautions  Will add protonix  Could consider both esophagram as well as swallowing eval if reflux precautions fail to improve symptoms.   COPD w/ acute exacerbation/ on-going tobacco abuse.  I have explained to the patient the importance of smoking cessation. He states he will "cut back -->but will not stop" Plan Would continue to work towards smoking cessation  Continue duoneb scheduled w/ PRN SABA pred taper    All other issues per IM  Erick Colace ACNP-BC [place] Pager # [phone] OR # [phone] if no answer   02/20/2015, 12:22 PM

## 2015-04-02 ENCOUNTER — Encounter (HOSPITAL_COMMUNITY): Payer: Self-pay

## 2015-04-02 ENCOUNTER — Inpatient Hospital Stay (HOSPITAL_COMMUNITY): Payer: Medicaid Other

## 2015-04-02 ENCOUNTER — Ambulatory Visit (HOSPITAL_COMMUNITY): Payer: Medicaid Other

## 2015-04-02 ENCOUNTER — Inpatient Hospital Stay (HOSPITAL_COMMUNITY)
Admission: EM | Admit: 2015-04-02 | Discharge: 2015-04-15 | DRG: 300 | Disposition: A | Discharge status: Nursing Home (Includes ICF) | Payer: Medicaid Other | Attending: Internal Medicine | Admitting: Internal Medicine

## 2015-04-02 ENCOUNTER — Emergency Department (HOSPITAL_COMMUNITY): Payer: Medicaid Other

## 2015-04-02 DIAGNOSIS — I739 Peripheral vascular disease, unspecified: Secondary | ICD-10-CM | POA: Diagnosis present

## 2015-04-02 DIAGNOSIS — E785 Hyperlipidemia, unspecified: Secondary | ICD-10-CM | POA: Diagnosis present

## 2015-04-02 DIAGNOSIS — D729 Disorder of white blood cells, unspecified: Secondary | ICD-10-CM | POA: Diagnosis not present

## 2015-04-02 DIAGNOSIS — I70261 Atherosclerosis of native arteries of extremities with gangrene, right leg: Secondary | ICD-10-CM | POA: Diagnosis not present

## 2015-04-02 DIAGNOSIS — F203 Undifferentiated schizophrenia: Secondary | ICD-10-CM | POA: Diagnosis not present

## 2015-04-02 DIAGNOSIS — D649 Anemia, unspecified: Secondary | ICD-10-CM

## 2015-04-02 DIAGNOSIS — F17210 Nicotine dependence, cigarettes, uncomplicated: Secondary | ICD-10-CM | POA: Diagnosis present

## 2015-04-02 DIAGNOSIS — I10 Essential (primary) hypertension: Secondary | ICD-10-CM | POA: Diagnosis present

## 2015-04-02 DIAGNOSIS — I708 Atherosclerosis of other arteries: Secondary | ICD-10-CM | POA: Diagnosis present

## 2015-04-02 DIAGNOSIS — E1165 Type 2 diabetes mellitus with hyperglycemia: Secondary | ICD-10-CM | POA: Diagnosis present

## 2015-04-02 DIAGNOSIS — E1151 Type 2 diabetes mellitus with diabetic peripheral angiopathy without gangrene: Secondary | ICD-10-CM | POA: Diagnosis not present

## 2015-04-02 DIAGNOSIS — I96 Gangrene, not elsewhere classified: Secondary | ICD-10-CM | POA: Diagnosis not present

## 2015-04-02 DIAGNOSIS — L089 Local infection of the skin and subcutaneous tissue, unspecified: Secondary | ICD-10-CM

## 2015-04-02 DIAGNOSIS — Z01810 Encounter for preprocedural cardiovascular examination: Secondary | ICD-10-CM

## 2015-04-02 DIAGNOSIS — F209 Schizophrenia, unspecified: Secondary | ICD-10-CM

## 2015-04-02 DIAGNOSIS — E11628 Type 2 diabetes mellitus with other skin complications: Secondary | ICD-10-CM | POA: Diagnosis present

## 2015-04-02 DIAGNOSIS — R52 Pain, unspecified: Secondary | ICD-10-CM

## 2015-04-02 DIAGNOSIS — Z01818 Encounter for other preprocedural examination: Secondary | ICD-10-CM

## 2015-04-02 DIAGNOSIS — J449 Chronic obstructive pulmonary disease, unspecified: Secondary | ICD-10-CM | POA: Diagnosis present

## 2015-04-02 DIAGNOSIS — E1152 Type 2 diabetes mellitus with diabetic peripheral angiopathy with gangrene: Secondary | ICD-10-CM | POA: Diagnosis present

## 2015-04-02 DIAGNOSIS — I70263 Atherosclerosis of native arteries of extremities with gangrene, bilateral legs: Secondary | ICD-10-CM | POA: Diagnosis present

## 2015-04-02 DIAGNOSIS — D538 Other specified nutritional anemias: Secondary | ICD-10-CM | POA: Diagnosis not present

## 2015-04-02 DIAGNOSIS — E1140 Type 2 diabetes mellitus with diabetic neuropathy, unspecified: Secondary | ICD-10-CM | POA: Diagnosis present

## 2015-04-02 DIAGNOSIS — E0840 Diabetes mellitus due to underlying condition with diabetic neuropathy, unspecified: Secondary | ICD-10-CM | POA: Diagnosis not present

## 2015-04-02 DIAGNOSIS — E1149 Type 2 diabetes mellitus with other diabetic neurological complication: Secondary | ICD-10-CM | POA: Diagnosis present

## 2015-04-02 DIAGNOSIS — D72828 Other elevated white blood cell count: Secondary | ICD-10-CM

## 2015-04-02 DIAGNOSIS — D638 Anemia in other chronic diseases classified elsewhere: Secondary | ICD-10-CM | POA: Diagnosis present

## 2015-04-02 DIAGNOSIS — L97519 Non-pressure chronic ulcer of other part of right foot with unspecified severity: Secondary | ICD-10-CM | POA: Diagnosis present

## 2015-04-02 DIAGNOSIS — I70269 Atherosclerosis of native arteries of extremities with gangrene, unspecified extremity: Secondary | ICD-10-CM | POA: Diagnosis not present

## 2015-04-02 DIAGNOSIS — Z7984 Long term (current) use of oral hypoglycemic drugs: Secondary | ICD-10-CM | POA: Diagnosis not present

## 2015-04-02 DIAGNOSIS — F200 Paranoid schizophrenia: Secondary | ICD-10-CM | POA: Diagnosis present

## 2015-04-02 DIAGNOSIS — M79671 Pain in right foot: Secondary | ICD-10-CM | POA: Diagnosis present

## 2015-04-02 DIAGNOSIS — Z79899 Other long term (current) drug therapy: Secondary | ICD-10-CM

## 2015-04-02 DIAGNOSIS — L03115 Cellulitis of right lower limb: Secondary | ICD-10-CM | POA: Diagnosis present

## 2015-04-02 DIAGNOSIS — J439 Emphysema, unspecified: Secondary | ICD-10-CM | POA: Diagnosis not present

## 2015-04-02 DIAGNOSIS — E0844 Diabetes mellitus due to underlying condition with diabetic amyotrophy: Secondary | ICD-10-CM | POA: Diagnosis not present

## 2015-04-02 DIAGNOSIS — E1169 Type 2 diabetes mellitus with other specified complication: Secondary | ICD-10-CM | POA: Diagnosis not present

## 2015-04-02 DIAGNOSIS — R739 Hyperglycemia, unspecified: Secondary | ICD-10-CM

## 2015-04-02 LAB — SEDIMENTATION RATE: Sed Rate: 73 mm/hr — ABNORMAL HIGH (ref 0–16)

## 2015-04-02 LAB — CBC WITH DIFFERENTIAL/PLATELET
Basophils Absolute: 0 10*3/uL (ref 0.0–0.1)
Basophils Relative: 0 %
EOS ABS: 0 10*3/uL (ref 0.0–0.7)
Eosinophils Relative: 0 %
HEMATOCRIT: 30.8 % — AB (ref 39.0–52.0)
HEMOGLOBIN: 9.7 g/dL — AB (ref 13.0–17.0)
LYMPHS ABS: 2.1 10*3/uL (ref 0.7–4.0)
Lymphocytes Relative: 14 %
MCH: 27.2 pg (ref 26.0–34.0)
MCHC: 31.5 g/dL (ref 30.0–36.0)
MCV: 86.3 fL (ref 78.0–100.0)
MONOS PCT: 5 %
Monocytes Absolute: 0.8 10*3/uL (ref 0.1–1.0)
NEUTROS PCT: 81 %
Neutro Abs: 12.2 10*3/uL — ABNORMAL HIGH (ref 1.7–7.7)
Platelets: 329 10*3/uL (ref 150–400)
RBC: 3.57 MIL/uL — ABNORMAL LOW (ref 4.22–5.81)
RDW: 13.9 % (ref 11.5–15.5)
WBC: 15.1 10*3/uL — ABNORMAL HIGH (ref 4.0–10.5)

## 2015-04-02 LAB — BASIC METABOLIC PANEL
Anion gap: 11 (ref 5–15)
BUN: 15 mg/dL (ref 6–20)
CHLORIDE: 99 mmol/L — AB (ref 101–111)
CO2: 28 mmol/L (ref 22–32)
CREATININE: 0.82 mg/dL (ref 0.61–1.24)
Calcium: 9.4 mg/dL (ref 8.9–10.3)
GFR calc Af Amer: >60 mL/min (ref >60)
GFR calc non Af Amer: >60 mL/min (ref >60)
GLUCOSE: 146 mg/dL — AB (ref 65–99)
Potassium: 4.4 mmol/L (ref 3.5–5.1)
SODIUM: 138 mmol/L (ref 135–145)

## 2015-04-02 LAB — GLUCOSE, CAPILLARY
Glucose-Capillary: 152 mg/dL — ABNORMAL HIGH (ref 65–99)
Glucose-Capillary: 232 mg/dL — ABNORMAL HIGH (ref 65–99)

## 2015-04-02 LAB — C-REACTIVE PROTEIN: CRP: 4.9 mg/dL — ABNORMAL HIGH (ref <1.0)

## 2015-04-02 MED ORDER — ACETAMINOPHEN 325 MG PO TABS
650.0000 mg | ORAL_TABLET | Freq: Four times a day (QID) | ORAL | Status: DC | PRN
  Administered 2015-04-02 – 2015-04-05 (×2): 650 mg via ORAL
  Filled 2015-04-02 (×2): qty 2

## 2015-04-02 MED ORDER — CLOZAPINE 100 MG PO TABS
100.0000 mg | ORAL_TABLET | Freq: Two times a day (BID) | ORAL | Status: DC
  Administered 2015-04-02 – 2015-04-15 (×25): 100 mg via ORAL
  Filled 2015-04-02 (×27): qty 1

## 2015-04-02 MED ORDER — SODIUM CHLORIDE 0.9 % IV SOLN
INTRAVENOUS | Status: AC
  Administered 2015-04-02: 14:00:00 via INTRAVENOUS

## 2015-04-02 MED ORDER — LISINOPRIL 20 MG PO TABS
20.0000 mg | ORAL_TABLET | Freq: Every day | ORAL | Status: DC
  Administered 2015-04-03 – 2015-04-04 (×2): 20 mg via ORAL
  Filled 2015-04-02 (×2): qty 1

## 2015-04-02 MED ORDER — IPRATROPIUM-ALBUTEROL 0.5-2.5 (3) MG/3ML IN SOLN
3.0000 mL | Freq: Three times a day (TID) | RESPIRATORY_TRACT | Status: DC
  Administered 2015-04-02: 3 mL via RESPIRATORY_TRACT
  Filled 2015-04-02: qty 3

## 2015-04-02 MED ORDER — MORPHINE SULFATE (PF) 4 MG/ML IV SOLN
4.0000 mg | Freq: Once | INTRAVENOUS | Status: AC
  Administered 2015-04-02: 4 mg via INTRAVENOUS
  Filled 2015-04-02: qty 1

## 2015-04-02 MED ORDER — GADOBENATE DIMEGLUMINE 529 MG/ML IV SOLN
20.0000 mL | Freq: Once | INTRAVENOUS | Status: AC | PRN
  Administered 2015-04-02: 16 mL via INTRAVENOUS

## 2015-04-02 MED ORDER — TRIHEXYPHENIDYL HCL 5 MG PO TABS
5.0000 mg | ORAL_TABLET | Freq: Two times a day (BID) | ORAL | Status: DC
  Administered 2015-04-02 – 2015-04-15 (×25): 5 mg via ORAL
  Filled 2015-04-02 (×32): qty 1

## 2015-04-02 MED ORDER — METRONIDAZOLE 500 MG PO TABS
500.0000 mg | ORAL_TABLET | Freq: Three times a day (TID) | ORAL | Status: DC
  Administered 2015-04-02 – 2015-04-10 (×23): 500 mg via ORAL
  Filled 2015-04-02 (×28): qty 1

## 2015-04-02 MED ORDER — MORPHINE SULFATE (PF) 2 MG/ML IV SOLN
1.0000 mg | INTRAVENOUS | Status: DC | PRN
  Administered 2015-04-02: 1 mg via INTRAVENOUS
  Filled 2015-04-02: qty 1

## 2015-04-02 MED ORDER — METOPROLOL TARTRATE 25 MG PO TABS
25.0000 mg | ORAL_TABLET | Freq: Two times a day (BID) | ORAL | Status: DC
  Administered 2015-04-02 – 2015-04-15 (×26): 25 mg via ORAL
  Filled 2015-04-02 (×28): qty 1

## 2015-04-02 MED ORDER — LACTULOSE 10 GM/15ML PO SOLN
30.0000 g | Freq: Two times a day (BID) | ORAL | Status: DC
  Administered 2015-04-02 – 2015-04-15 (×18): 30 g via ORAL
  Filled 2015-04-02 (×27): qty 45

## 2015-04-02 MED ORDER — OXYCODONE HCL 5 MG PO TABS
5.0000 mg | ORAL_TABLET | ORAL | Status: DC | PRN
  Administered 2015-04-02 – 2015-04-07 (×20): 5 mg via ORAL
  Filled 2015-04-02 (×20): qty 1

## 2015-04-02 MED ORDER — HALOPERIDOL 1 MG PO TABS
10.0000 mg | ORAL_TABLET | Freq: Every day | ORAL | Status: DC
  Administered 2015-04-02 – 2015-04-14 (×13): 10 mg via ORAL
  Filled 2015-04-02 (×12): qty 2
  Filled 2015-04-02 (×3): qty 10

## 2015-04-02 MED ORDER — GLUCERNA SHAKE PO LIQD
237.0000 mL | Freq: Two times a day (BID) | ORAL | Status: DC
  Administered 2015-04-02 – 2015-04-15 (×23): 237 mL via ORAL
  Filled 2015-04-02 (×24): qty 237

## 2015-04-02 MED ORDER — VANCOMYCIN HCL IN DEXTROSE 1-5 GM/200ML-% IV SOLN
1000.0000 mg | Freq: Once | INTRAVENOUS | Status: AC
  Administered 2015-04-02: 1000 mg via INTRAVENOUS
  Filled 2015-04-02: qty 200

## 2015-04-02 MED ORDER — DICYCLOMINE HCL 20 MG PO TABS
20.0000 mg | ORAL_TABLET | Freq: Two times a day (BID) | ORAL | Status: DC | PRN
Start: 2015-04-02 — End: 2015-04-15
  Administered 2015-04-12: 20 mg via ORAL
  Filled 2015-04-02 (×3): qty 1

## 2015-04-02 MED ORDER — SIMVASTATIN 5 MG PO TABS
5.0000 mg | ORAL_TABLET | Freq: Every day | ORAL | Status: DC
  Administered 2015-04-02 – 2015-04-10 (×8): 5 mg via ORAL
  Filled 2015-04-02 (×10): qty 1

## 2015-04-02 MED ORDER — DONEPEZIL HCL 5 MG PO TABS
5.0000 mg | ORAL_TABLET | Freq: Every day | ORAL | Status: DC
  Administered 2015-04-02 – 2015-04-14 (×13): 5 mg via ORAL
  Filled 2015-04-02 (×15): qty 1

## 2015-04-02 MED ORDER — MORPHINE SULFATE (PF) 2 MG/ML IV SOLN
2.0000 mg | INTRAVENOUS | Status: DC | PRN
  Administered 2015-04-02: 1 mg via INTRAVENOUS
  Administered 2015-04-02 – 2015-04-07 (×4): 2 mg via INTRAVENOUS
  Filled 2015-04-02 (×4): qty 1

## 2015-04-02 MED ORDER — IPRATROPIUM-ALBUTEROL 0.5-2.5 (3) MG/3ML IN SOLN: 3.0000 mL | Freq: Four times a day (QID) | RESPIRATORY_TRACT | Status: DC | PRN

## 2015-04-02 MED ORDER — PANTOPRAZOLE SODIUM 40 MG PO TBEC
40.0000 mg | DELAYED_RELEASE_TABLET | Freq: Every day | ORAL | Status: DC
  Administered 2015-04-02 – 2015-04-15 (×13): 40 mg via ORAL
  Filled 2015-04-02 (×14): qty 1

## 2015-04-02 MED ORDER — ACETAMINOPHEN 650 MG RE SUPP: 650.0000 mg | Freq: Four times a day (QID) | RECTAL | Status: DC | PRN

## 2015-04-02 MED ORDER — CLOZAPINE 100 MG PO TABS
400.0000 mg | ORAL_TABLET | Freq: Every day | ORAL | Status: DC
  Administered 2015-04-02 – 2015-04-14 (×13): 400 mg via ORAL
  Filled 2015-04-02 (×14): qty 4

## 2015-04-02 MED ORDER — GABAPENTIN 300 MG PO CAPS
300.0000 mg | ORAL_CAPSULE | Freq: Three times a day (TID) | ORAL | Status: DC
  Administered 2015-04-02 – 2015-04-15 (×38): 300 mg via ORAL
  Filled 2015-04-02 (×40): qty 1

## 2015-04-02 MED ORDER — INSULIN ASPART 100 UNIT/ML ~~LOC~~ SOLN
0.0000 [IU] | Freq: Three times a day (TID) | SUBCUTANEOUS | Status: DC
  Administered 2015-04-02 – 2015-04-03 (×2): 3 [IU] via SUBCUTANEOUS
  Administered 2015-04-03: 2 [IU] via SUBCUTANEOUS
  Administered 2015-04-04: 3 [IU] via SUBCUTANEOUS
  Administered 2015-04-04: 5 [IU] via SUBCUTANEOUS
  Administered 2015-04-04: 3 [IU] via SUBCUTANEOUS
  Administered 2015-04-05: 2 [IU] via SUBCUTANEOUS
  Administered 2015-04-05: 5 [IU] via SUBCUTANEOUS
  Administered 2015-04-05: 2 [IU] via SUBCUTANEOUS
  Administered 2015-04-06: 5 [IU] via SUBCUTANEOUS
  Administered 2015-04-06: 2 [IU] via SUBCUTANEOUS
  Administered 2015-04-06: 3 [IU] via SUBCUTANEOUS
  Administered 2015-04-07: 2 [IU] via SUBCUTANEOUS
  Administered 2015-04-07: 5 [IU] via SUBCUTANEOUS
  Administered 2015-04-08 (×2): 2 [IU] via SUBCUTANEOUS
  Administered 2015-04-08: 3 [IU] via SUBCUTANEOUS
  Administered 2015-04-09: 2 [IU] via SUBCUTANEOUS
  Administered 2015-04-09 – 2015-04-10 (×2): 3 [IU] via SUBCUTANEOUS
  Administered 2015-04-11 – 2015-04-12 (×5): 2 [IU] via SUBCUTANEOUS
  Administered 2015-04-13 (×3): 3 [IU] via SUBCUTANEOUS
  Administered 2015-04-14 (×3): 2 [IU] via SUBCUTANEOUS

## 2015-04-02 MED ORDER — SENNA 8.6 MG PO TABS
1.0000 | ORAL_TABLET | Freq: Two times a day (BID) | ORAL | Status: DC
  Administered 2015-04-02 – 2015-04-15 (×21): 8.6 mg via ORAL
  Filled 2015-04-02 (×15): qty 1

## 2015-04-02 MED ORDER — PIPERACILLIN-TAZOBACTAM 3.375 G IVPB 30 MIN
3.3750 g | Freq: Once | INTRAVENOUS | Status: AC
  Administered 2015-04-02: 3.375 g via INTRAVENOUS
  Filled 2015-04-02: qty 50

## 2015-04-02 MED ORDER — DEXTROSE 5 % IV SOLN
2.0000 g | Freq: Three times a day (TID) | INTRAVENOUS | Status: DC
  Administered 2015-04-02 – 2015-04-10 (×24): 2 g via INTRAVENOUS
  Filled 2015-04-02 (×27): qty 2

## 2015-04-02 MED ORDER — POLYETHYLENE GLYCOL 3350 17 G PO PACK
17.0000 g | PACK | Freq: Every day | ORAL | Status: DC | PRN
Start: 2015-04-02 — End: 2015-04-15

## 2015-04-02 MED ORDER — VANCOMYCIN HCL 10 G IV SOLR
1500.0000 mg | Freq: Two times a day (BID) | INTRAVENOUS | Status: DC
  Administered 2015-04-02 – 2015-04-10 (×15): 1500 mg via INTRAVENOUS
  Filled 2015-04-02 (×17): qty 1500

## 2015-04-02 MED ORDER — ENOXAPARIN SODIUM 40 MG/0.4ML ~~LOC~~ SOLN
40.0000 mg | SUBCUTANEOUS | Status: DC
  Administered 2015-04-02 – 2015-04-14 (×13): 40 mg via SUBCUTANEOUS
  Filled 2015-04-02 (×14): qty 0.4

## 2015-04-02 NOTE — ED Provider Notes (Signed)
Medical screening examination/treatment/procedure(s) were conducted as a shared visit with non-physician practitioner(s) and myself.  I personally evaluated the patient during the encounter.   EKG Interpretation None       See the written copy of this report in the patient's paper medical record.  These results did not interface directly into the electronic medical record and are summarized here.  60 yo M with black toe.  Going on for a couple months, not worsening or improving.  Painful.  Denies injury or discharge.  Black with possible eschar, xray without osteo, will admit for iv abx, mri.   Craig Etienne, DO 04/02/15 1514

## 2015-04-02 NOTE — Progress Notes (Signed)
Pharmacy Antibiotic Note  Craig Bentley is a 60 y.o. male admitted on 04/02/2015 with foot infection.  Pharmacy has been consulted for vancomycin dosing.  Plan:  Vancomycin 1g and Zosyn 3.375g IV ordered in ED.  Continue with vancomycin '1500mg'$  IV q12h to start 8hr after 1g dose above Check trough at steady state Monitor renal function closely F/u any culture data if ordered Continue gram negative/anaerobic coverage on admission?    Temp (24hrs), Avg:97.9 F (36.6 C), Min:97.9 F (36.6 C), Max:97.9 F (36.6 C)   Recent Labs Lab 04/02/15 0659  WBC 15.1*  CREATININE 0.82    CrCl cannot be calculated (Unknown ideal weight.).    No Known Allergies  Antimicrobials this admission: 2/8 >> vancomycin >> 2/8 >> zosyn x 1 >>  Dose adjustments this admission: --  Microbiology results: None currently ordered  Thank you for allowing pharmacy to be a part of this patient's care.  Peggyann Juba, PharmD, BCPS Pager: [phone] 04/02/2015 8:18 AM

## 2015-04-02 NOTE — ED Notes (Signed)
Delay in lab draw,  Pt not in room 

## 2015-04-02 NOTE — Progress Notes (Signed)
Initial Nutrition Assessment  DOCUMENTATION CODES:   Not applicable  INTERVENTION:  - Will order Glucerna Shake BID, each supplement provides 220 kcal and 10 grams of protein - Provide pt with soft foods given no teeth, no dentures - RD will continue to monitor for needs  NUTRITION DIAGNOSIS:   Biting/chewing difficulty related to other (see comment) (poor dentition) as evidenced by other (see comment) (no teeth, no dentures).  GOAL:   Patient will meet greater than or equal to 90% of their needs  MONITOR:   PO intake, Supplement acceptance, Weight trends, Labs, Skin, I & O's  REASON FOR ASSESSMENT:   Consult Wound healing  ASSESSMENT:   60 y.o. male with a past medical history of schizophrenia, diabetes on oral agents, hypertension, who lives in a group home and was seen in the emergency department due to complaints of pain in the right foot, especially in the fifth toe area. Patient is a poor historian, probably due to his psychiatric illness. He tells me that he's had problems in his right foot since Thanksgiving. Unclear if he has sought any attention for this before. He complains of pain in the right foot. Complains of some chills at times. Denies any nausea, vomiting. No injuries. No falls. No passing out spells. After he received morphine in the emergency department he is feeling better. Evaluation in the emergency department revealed infected fifth right toe. We were called for further management.  Pt seen for consult. BMI indicates overweight status. No intakes documented but pt eating a salad for lunch during RD assessment. Pt has no teeth, no dentures but denies difficulties with chewing lettuce from salad. He states he has a good appetite today and states that PTA he usually ate 3 meals/day and 2-3 snacks/day. Snacks usually consisted of fruit or dessert items to assist with CBGs. Pt was not drinking nutrition supplements PTA; will trial Glucerna BID to provide kcal and  protein.   Pt denies recent weight changes. Current weight consistent with weight from 02/16/15. No muscle or fat wasting noted during physical assessment. Pt likely meeting kcal needs, unsure about protein needs, PTA. Medications reviewed. Labs reviewed.   Diet Order:  Diet Carb Modified Fluid consistency:: Thin; Room service appropriate?: Yes  Skin:  Wound (see comment) (DM ulcer to R pinky toe)  Last BM:  PTA  Height:   Ht Readings from Last 1 Encounters:  04/02/15 '5\' 11"'$  (1.803 m)    Weight:   Wt Readings from Last 1 Encounters:  04/02/15 182 lb (82.555 kg)    Ideal Body Weight:  78.18 kg (kg)  BMI:  Body mass index is 25.4 kg/(m^2).  Estimated Nutritional Needs:   Kcal:  1650-1850  Protein:  82-95 grams  Fluid:  2 L/day  EDUCATION NEEDS:   No education needs identified at this time    Jarome Matin, RD, LDN Inpatient Clinical Dietitian Pager # [phone] After hours/weekend pager # [phone]

## 2015-04-02 NOTE — ED Notes (Signed)
Pt care assumed.  Pt is resting comfortably.  Asking to have something to eat.

## 2015-04-02 NOTE — Progress Notes (Signed)
VASCULAR LAB PRELIMINARY  ARTERIAL  ABI completed:    RIGHT    LEFT    PRESSURE WAVEFORM  PRESSURE WAVEFORM  BRACHIAL 161 Triphasic BRACHIAL 144 Triphasic  DP 43 Dampened Monophasic DP 73 Monophasic  PT 43 Dampened Monophasic PT 66 Monophasic    RIGHT LEFT  ABI 0.27 0.43   ABIs indicate a severe reduction in arterial flow with abnormal Doppler waveforms bilaterally at rest  Craig Bentley, RVS 04/02/2015, 1:23 PM

## 2015-04-02 NOTE — Progress Notes (Signed)
Pharmacy Antibiotic Note  Basil Buffin is a 60 y.o. male admitted on 04/02/2015 with foot infection.  Pharmacy has been consulted for vancomycin and cefepime dosing.  Plan:  Vancomycin 1g and Zosyn 3.375g IV ordered in ED.  Continue with vancomycin '1500mg'$  IV q12h to start 8hr after 1g dose above  Start cefepime 2gm IV q8h Check trough at steady state Monitor renal function closely F/u any culture data if ordered  Height: '5\' 11"'$  (180.3 cm) Weight: 182 lb (82.555 kg) IBW/kg (Calculated) : 75.3  Temp (24hrs), Avg:98.2 F (36.8 C), Min:97.9 F (36.6 C), Max:98.4 F (36.9 C)   Recent Labs Lab 04/02/15 0659  WBC 15.1*  CREATININE 0.82    Estimated Creatinine Clearance: 102 mL/min (by C-G formula based on Cr of 0.82).    No Known Allergies  Antimicrobials this admission: 2/8 >> vancomycin >> 2/8 >> zosyn x 1 >> 2/8 >> cefepime  Dose adjustments this admission: --  Microbiology results: None currently ordered  Thank you for allowing pharmacy to be a part of this patient's care.  Dolly Rias RPh 04/02/2015, 11:00 AM Pager [phone]

## 2015-04-02 NOTE — Progress Notes (Signed)
Utilization review completed.  

## 2015-04-02 NOTE — ED Notes (Signed)
Bed: OR30 Expected date:  Expected time:  Means of arrival:  Comments: EMS 60 yo male from [place] care/pinky toe pain

## 2015-04-02 NOTE — Progress Notes (Signed)
Pt states that he only uses Neb tx's at home as needed.  RT changed orders to reflect home regimen, RT to monitor and assess as needed.

## 2015-04-02 NOTE — H&P (Signed)
[facility] History and Physical  Duglas Heier ZOX:[id number] DOB: Feb 06, 1956 DOA: 04/02/2015   PCP: Mabeline Caras, NP  Specialists: Unknown  Chief Complaint: Pain in the right foot  HPI: Craig Bentley is a 60 y.o. male with a past medical history of schizophrenia, diabetes on oral agents, hypertension, who lives in a group home and was seen in the emergency department due to complaints of pain in the right foot, especially in the fifth toe area. Patient is a poor historian, probably due to his psychiatric illness. He tells me that he's had problems in his right foot since Thanksgiving. Unclear if he has sought any attention for this before. He complains of pain in the right foot. Complains of some chills at times. Denies any nausea, vomiting. No injuries. No falls. No passing out spells. After he received morphine in the emergency department he is feeling better. Evaluation in the emergency department revealed infected fifth right toe. We were called for further management.  Home Medications: Prior to Admission medications   Medication Sig Start Date End Date Taking? Authorizing Provider  acetaminophen (TYLENOL) 325 MG tablet Take 650 mg by mouth every 6 (six) hours as needed for mild pain, fever or headache.    Yes Historical Provider, MD  albuterol (PROVENTIL HFA;VENTOLIN HFA) 108 (90 BASE) MCG/ACT inhaler Inhale 2 puffs into the lungs every 4 (four) hours as needed for wheezing or shortness of breath.    Yes Historical Provider, MD  albuterol (PROVENTIL) (2.5 MG/3ML) 0.083% nebulizer solution Take 3 mLs (2.5 mg total) by nebulization every 4 (four) hours as needed for wheezing. 02/19/15  Yes Theodis Blaze, MD  cloZAPine (CLOZARIL) 100 MG tablet Take 100-400 mg by mouth 3 (three) times daily. Takes 1 tablet every morning and afternoon Takes 4 tablets at bedtime   Yes Historical Provider, MD  dicyclomine (BENTYL) 20 MG tablet Take 20 mg by mouth 2 (two) times daily as needed for spasms.    Yes Historical Provider, MD  donepezil (ARICEPT) 5 MG tablet Take 5 mg by mouth at bedtime.   Yes Historical Provider, MD  gabapentin (NEURONTIN) 300 MG capsule Take 300 mg by mouth 3 (three) times daily.   Yes Historical Provider, MD  guaifenesin (ROBITUSSIN) 100 MG/5ML syrup Take 200 mg by mouth 4 (four) times daily as needed for cough.   Yes Historical Provider, MD  haloperidol (HALDOL) 10 MG tablet Take 10 mg by mouth at bedtime.   Yes Historical Provider, MD  hydrochlorothiazide (HYDRODIURIL) 25 MG tablet Take 25 mg by mouth daily.   Yes Historical Provider, MD  ipratropium-albuterol (DUONEB) 0.5-2.5 (3) MG/3ML SOLN Take 3 mLs by nebulization 3 (three) times daily. 02/19/15  Yes Theodis Blaze, MD  lactulose (CHRONULAC) 10 GM/15ML solution Take 30 g by mouth 2 (two) times daily.   Yes Historical Provider, MD  lisinopril (PRINIVIL,ZESTRIL) 20 MG tablet Take 20 mg by mouth daily.   Yes Historical Provider, MD  metFORMIN (GLUCOPHAGE) 500 MG tablet Take 500 mg by mouth 2 (two) times daily with a meal.   Yes Historical Provider, MD  metoprolol tartrate (LOPRESSOR) 25 MG tablet Take 25 mg by mouth 2 (two) times daily.   Yes Historical Provider, MD  Multiple Vitamin (MULITIVITAMIN WITH MINERALS) TABS Take 1 tablet by mouth daily.   Yes Historical Provider, MD  pantoprazole (PROTONIX) 40 MG tablet Take 1 tablet (40 mg total) by mouth daily. 02/20/15  Yes Theodis Blaze, MD  polyethylene glycol [facility] Floria Raveling) packet Take 17  g by mouth daily as needed for mild constipation or moderate constipation.   Yes Historical Provider, MD  senna (SENOKOT) 8.6 MG TABS Take 1 tablet by mouth 2 (two) times daily.   Yes Historical Provider, MD  simvastatin (ZOCOR) 5 MG tablet Take 5 mg by mouth at bedtime.   Yes Historical Provider, MD  trihexyphenidyl (ARTANE) 5 MG tablet Take 5 mg by mouth 2 (two) times daily with a meal.   Yes Historical Provider, MD    Allergies: No Known Allergies  Past Medical  History: Past Medical History  Diagnosis Date  . Hypertension   . Diabetes mellitus   . Schizophrenia, schizo-affective ([place])   . COPD (chronic obstructive pulmonary disease) ([place])   . Constipation   . Fecal impaction ([place])     History reviewed. No pertinent past surgical history.  Social History: Patient lives in a group home.  Social History   Social History  . Marital Status: Single    Spouse Name: N/A  . Number of Children: N/A  . Years of Education: N/A   Occupational History  . Not on file.   Social History Main Topics  . Smoking status: Current Every Day Smoker -- 1.50 packs/day    Types: Cigarettes  . Smokeless tobacco: Not on file  . Alcohol Use: No  . Drug Use: No  . Sexual Activity: No   Other Topics Concern  . Not on file   Social History Narrative    Family History:  Family History  Problem Relation Age of Onset  . Mental illness Mother      Review of Systems - History obtained from the patient General ROS: positive for  - chills and fatigue Psychological ROS: negative Ophthalmic ROS: negative ENT ROS: negative Allergy and Immunology ROS: negative Hematological and Lymphatic ROS: negative Endocrine ROS: negative Respiratory ROS: no cough, shortness of breath, or wheezing Cardiovascular ROS: no chest pain or dyspnea on exertion Gastrointestinal ROS: no abdominal pain, change in bowel habits, or black or bloody stools Genito-Urinary ROS: no dysuria, trouble voiding, or hematuria Musculoskeletal ROS: as in hpi Neurological ROS: no TIA or stroke symptoms Dermatological ROS: negative  Physical Examination  Filed Vitals:   04/02/15 0846 04/02/15 0909 04/02/15 0940 04/02/15 1006  BP:  160/68 145/72 153/80  Pulse:  85 84 94  Temp:   98.4 F (36.9 C) 98.4 F (36.9 C)  TempSrc:   Oral Oral  Resp:  14 18 16   Height: 5' 11"  (1.803 m)     Weight: 82.555 kg (182 lb)     SpO2:  95% 96% 100%    BP 153/80 mmHg  Pulse 94  Temp(Src) 98.4 F  (36.9 C) (Oral)  Resp 16  Ht 5' 11"  (1.803 m)  Wt 82.555 kg (182 lb)  BMI 25.40 kg/m2  SpO2 100%  General appearance: alert, cooperative, appears stated age and no distress Head: Normocephalic, without obvious abnormality, atraumatic Eyes: conjunctivae/corneas clear. PERRL, EOM's intact.  Throat: lips, mucosa, and tongue normal; teeth and gums normal Neck: no adenopathy, no carotid bruit, no JVD, supple, symmetrical, trachea midline and thyroid not enlarged, symmetric, no tenderness/mass/nodules Resp: clear to auscultation bilaterally Cardio: regular rate and rhythm, S1, S2 normal, no murmur, click, rub or gallop GI: soft, non-tender; bowel sounds normal; no masses,  no organomegaly Extremities: Macerated appearance of the right fifth toe with some erythema. Some warmth is noted over the right forefoot. Pulses are present but poorly palpable. Skin: Macerated skin over the right fifth  toe. Erythema is present.  Lymph nodes: Cervical, supraclavicular, and axillary nodes normal. Neurologic: He is alert. Oriented to place. No focal deficits.    Laboratory Data: Results for orders placed or performed during the hospital encounter of 04/02/15 (from the past 48 hour(s))  CBC with Differential     Status: Abnormal   Collection Time: 04/02/15  6:59 AM  Result Value Ref Range   WBC 15.1 (H) 4.0 - 10.5 K/uL   RBC 3.57 (L) 4.22 - 5.81 MIL/uL   Hemoglobin 9.7 (L) 13.0 - 17.0 g/dL   HCT 30.8 (L) 39.0 - 52.0 %   MCV 86.3 78.0 - 100.0 fL   MCH 27.2 26.0 - 34.0 pg   MCHC 31.5 30.0 - 36.0 g/dL   RDW 13.9 11.5 - 15.5 %   Platelets 329 150 - 400 K/uL   Neutrophils Relative % 81 %   Neutro Abs 12.2 (H) 1.7 - 7.7 K/uL   Lymphocytes Relative 14 %   Lymphs Abs 2.1 0.7 - 4.0 K/uL   Monocytes Relative 5 %   Monocytes Absolute 0.8 0.1 - 1.0 K/uL   Eosinophils Relative 0 %   Eosinophils Absolute 0.0 0.0 - 0.7 K/uL   Basophils Relative 0 %   Basophils Absolute 0.0 0.0 - 0.1 K/uL  Basic metabolic  panel     Status: Abnormal   Collection Time: 04/02/15  6:59 AM  Result Value Ref Range   Sodium 138 135 - 145 mmol/L   Potassium 4.4 3.5 - 5.1 mmol/L   Chloride 99 (L) 101 - 111 mmol/L   CO2 28 22 - 32 mmol/L   Glucose, Bld 146 (H) 65 - 99 mg/dL   BUN 15 6 - 20 mg/dL   Creatinine, Ser 0.82 0.61 - 1.24 mg/dL   Calcium 9.4 8.9 - 10.3 mg/dL   GFR calc non Af Amer >60 >60 mL/min   GFR calc Af Amer >60 >60 mL/min    Comment: (NOTE) The eGFR has been calculated using the CKD EPI equation. This calculation has not been validated in all clinical situations. eGFR's persistently <60 mL/min signify possible Chronic Kidney Disease.    Anion gap 11 5 - 15  Sedimentation rate     Status: Abnormal   Collection Time: 04/02/15  6:59 AM  Result Value Ref Range   Sed Rate 73 (H) 0 - 16 mm/hr  C-reactive protein     Status: Abnormal   Collection Time: 04/02/15  6:59 AM  Result Value Ref Range   CRP 4.9 (H) <1.0 mg/dL    Comment: Performed at [facility]  Glucose, capillary     Status: Abnormal   Collection Time: 04/02/15 11:22 AM  Result Value Ref Range   Glucose-Capillary 152 (H) 65 - 99 mg/dL    Radiology Reports: Dg Foot Complete Right  04/02/2015  CLINICAL DATA:  Right pinky toe is black, with dorsal soft tissue swelling and erythema along the foot. Initial encounter. EXAM: RIGHT FOOT COMPLETE - 3+ VIEW COMPARISON:  None. FINDINGS: There is no evidence of fracture or dislocation. No definite osseous erosion is characterized, though evaluation for osteomyelitis is limited on radiograph. The joint spaces are preserved. There is no evidence of talar subluxation; the subtalar joint is unremarkable in appearance. An os peroneum is seen. Mild dorsal soft tissue swelling is noted. IMPRESSION: 1. No evidence of fracture or dislocation. No definite osseous erosion seen, though evaluation for osteomyelitis is limited on radiograph. 2. Os peroneum seen. Electronically Signed  By: Garald Balding  M.D.   On: 04/02/2015 06:47     Problem List  Principal Problem:   Diabetic foot infection ([place]) Active Problems:   Essential hypertension   Diabetes mellitus with neurologic complication, without long-term current use of insulin (HCC)   Right foot pain   Neutrophilic leukocytosis   Assessment: This is a 60 year old African-American male with a past medical history of diabetes, schizophrenia, who presents with complaints of pain in his right foot. He appears to have cellulitis involving his right forefoot. Specifically, he has an infected wound on the right fifth toe. Plain x-rays have not revealed any findings suggestive of osteomyelitis.  Plan: #1 Diabetic foot infection with the possible osteomyelitis of the right fifth toe: Patient will be hospitalized. He'll be placed on intravenous antibiotics including vancomycin, cefepime and Flagyl. MRI of the foot will be ordered. Arterial Doppler studies will be ordered. Depending on the findings of the studies, orthopedics may have to be involved.  #2 History of type 2 diabetes on oral agents: Sliding scale insulin coverage will be initiated. No recent HbA1c is available. This will be ordered for tomorrow morning.  #3 history of schizophrenia: Continue with his home medications. Patient's mental status appears to be at baseline.  #4 Normocytic anemia: Monitor hemoglobins closely. Anemia panel in the morning.   DVT Prophylaxis: Lovenox Code Status: Full code Family Communication: Discussed with the patient  Disposition Plan: Admit to MedSurg   Further management decisions will depend on results of further testing and patient's response to treatment.   [facility]  [facility] Pager [phone]  If 7PM-7AM, please contact night-coverage [email or web address] Password [facility]  04/02/2015, 12:28 PM

## 2015-04-02 NOTE — ED Notes (Signed)
Pt's right pinky toe is black and top of foot is red and swollen, between the pinky toe and other toe the skin is raw

## 2015-04-02 NOTE — ED Provider Notes (Signed)
CSN: [id number]     Arrival date & time 04/02/15  0546 History   First MD Initiated Contact with Patient 04/02/15 [phone]     Chief Complaint  Patient presents with  . Toe Pain     (Consider location/radiation/quality/duration/timing/severity/associated sxs/prior Treatment) HPI Comments: Bern Fare is a 60 y.o. male with a PMHx of HTN, DM2, schizophrenia, and COPD, who presents to the ED with complaints of right foot pain 3 months. Patient states that he noticed he had pain and a wound of the right pinky toe in November 2016, but did not seek any medical attention until now because the pain has gradually worsened. He describes the pain as 5/10 sharp constant right foot nonradiating pain worse with walking and unrelieved with Tylenol. Associated symptoms include erythema, swelling to the foot, blackening to the distal pinky and a wound on the pinky, and warmth to the foot. He states he thought his symptoms were because he had shoes that were too tight.  He denies any fevers, chills, chest pain, shortness breath, abdominal pain, nausea, vomiting, diarrhea, constipation, dysuria, hematuria, numbness, tingling, or weakness. No known injury.  Patient is a 60 y.o. male presenting with toe pain. The history is provided by the patient. No language interpreter was used.  Toe Pain This is a new problem. The current episode started more than 1 month ago. The problem occurs constantly. The problem has been gradually worsening. Associated symptoms include arthralgias (R foot) and joint swelling (R foot). Pertinent negatives include no abdominal pain, chest pain, chills, fever, myalgias, nausea, numbness, urinary symptoms, vomiting or weakness. The symptoms are aggravated by walking. He has tried acetaminophen for the symptoms. The treatment provided no relief.    Past Medical History  Diagnosis Date  . Hypertension   . Diabetes mellitus   . Schizophrenia, schizo-affective ([place])   . COPD (chronic obstructive  pulmonary disease) ([place])   . Constipation   . Fecal impaction ([place])    History reviewed. No pertinent past surgical history. Family History  Problem Relation Age of Onset  . Mental illness Mother    Social History  Substance Use Topics  . Smoking status: Current Every Day Smoker -- 1.50 packs/day    Types: Cigarettes  . Smokeless tobacco: None  . Alcohol Use: No    Review of Systems  Constitutional: Negative for fever and chills.  Respiratory: Negative for shortness of breath.   Cardiovascular: Negative for chest pain.  Gastrointestinal: Negative for nausea, vomiting, abdominal pain, diarrhea and constipation.  Genitourinary: Negative for dysuria and hematuria.  Musculoskeletal: Positive for joint swelling (R foot) and arthralgias (R foot). Negative for myalgias.  Skin: Positive for color change and wound.  Allergic/Immunologic: Positive for immunocompromised state (diabetic).  Neurological: Negative for weakness and numbness.  Psychiatric/Behavioral: Negative for confusion.   10 Systems reviewed and are negative for acute change except as noted in the HPI.    Allergies  Review of patient's allergies indicates no known allergies.  Home Medications   Prior to Admission medications   Medication Sig Start Date End Date Taking? Authorizing Provider  acetaminophen (TYLENOL) 325 MG tablet Take 650 mg by mouth every 6 (six) hours as needed for mild pain, fever or headache.     Historical Provider, MD  albuterol (PROVENTIL HFA;VENTOLIN HFA) 108 (90 BASE) MCG/ACT inhaler Inhale 2 puffs into the lungs every 4 (four) hours as needed for wheezing or shortness of breath.     Historical Provider, MD  albuterol (PROVENTIL) (2.5 MG/3ML) 0.083%  nebulizer solution Take 3 mLs (2.5 mg total) by nebulization every 4 (four) hours as needed for wheezing. 02/19/15   Theodis Blaze, MD  cloZAPine (CLOZARIL) 100 MG tablet Take 100-400 mg by mouth 3 (three) times daily. Takes 1 tablet every morning and  afternoon Takes 4 tablets at bedtime    Historical Provider, MD  dicyclomine (BENTYL) 20 MG tablet Take 20 mg by mouth 2 (two) times daily as needed for spasms.    Historical Provider, MD  donepezil (ARICEPT) 5 MG tablet Take 5 mg by mouth at bedtime.    Historical Provider, MD  gabapentin (NEURONTIN) 300 MG capsule Take 300 mg by mouth 3 (three) times daily.    Historical Provider, MD  guaifenesin (ROBITUSSIN) 100 MG/5ML syrup Take 200 mg by mouth 4 (four) times daily as needed for cough.    Historical Provider, MD  haloperidol (HALDOL) 10 MG tablet Take 10 mg by mouth at bedtime.    Historical Provider, MD  hydrochlorothiazide (HYDRODIURIL) 25 MG tablet Take 25 mg by mouth daily.    Historical Provider, MD  HYDROcodone-acetaminophen (NORCO/VICODIN) 5-325 MG tablet Take 1-2 tablets by mouth every 4 (four) hours as needed for moderate pain. 02/19/15   Theodis Blaze, MD  ipratropium-albuterol (DUONEB) 0.5-2.5 (3) MG/3ML SOLN Take 3 mLs by nebulization 3 (three) times daily. 02/19/15   Theodis Blaze, MD  lactulose (CHRONULAC) 10 GM/15ML solution Take 30 g by mouth 2 (two) times daily.    Historical Provider, MD  levofloxacin (LEVAQUIN) 500 MG tablet Take 1 tablet (500 mg total) by mouth daily. 02/19/15   Theodis Blaze, MD  metFORMIN (GLUCOPHAGE) 500 MG tablet Take 500 mg by mouth 2 (two) times daily with a meal.    Historical Provider, MD  metoprolol tartrate (LOPRESSOR) 25 MG tablet Take 25 mg by mouth 2 (two) times daily.    Historical Provider, MD  Multiple Vitamin (MULITIVITAMIN WITH MINERALS) TABS Take 1 tablet by mouth daily.    Historical Provider, MD  pantoprazole (PROTONIX) 40 MG tablet Take 1 tablet (40 mg total) by mouth daily. 02/20/15   Theodis Blaze, MD  polyethylene glycol (MIRALAX / Floria Raveling) packet Take 17 g by mouth daily as needed for mild constipation or moderate constipation.    Historical Provider, MD  predniSONE (DELTASONE) 10 MG tablet Take 40 mg tablet and taper down by 10 mg  daily until completed 02/19/15   Theodis Blaze, MD  senna (SENOKOT) 8.6 MG TABS Take 1 tablet by mouth 2 (two) times daily.    Historical Provider, MD  simvastatin (ZOCOR) 5 MG tablet Take 5 mg by mouth at bedtime.    Historical Provider, MD  trihexyphenidyl (ARTANE) 5 MG tablet Take 5 mg by mouth 2 (two) times daily with a meal.    Historical Provider, MD   BP 148/77 mmHg  Pulse 95  Temp(Src) 97.9 F (36.6 C) (Oral)  Resp 20  SpO2 95% Physical Exam  Constitutional: He is oriented to person, place, and time. Vital signs are normal. He appears well-developed and well-nourished.  Non-toxic appearance. No distress.  Afebrile, nontoxic, NAD  HENT:  Head: Normocephalic and atraumatic.  Mouth/Throat: Oropharynx is clear and moist and mucous membranes are normal.  Eyes: Conjunctivae and EOM are normal. Right eye exhibits no discharge. Left eye exhibits no discharge.  Neck: Normal range of motion. Neck supple.  Cardiovascular: Normal rate, regular rhythm, normal heart sounds and intact distal pulses.  Exam reveals no gallop and no friction  rub.   No murmur heard. Pulses:      Dorsalis pedis pulses are 1+ on the right side, and 1+ on the left side.  Distal pulses intact although DP pulses difficult to palpate, but doppler confirms location and flow brisk and adequate  Pulmonary/Chest: Effort normal and breath sounds normal. No respiratory distress. He has no decreased breath sounds. He has no wheezes. He has no rhonchi. He has no rales.  Abdominal: Soft. Normal appearance and bowel sounds are normal. He exhibits no distension. There is no tenderness. There is no rigidity, no rebound, no guarding and no CVA tenderness.  Musculoskeletal: Normal range of motion.       Right foot: There is tenderness, bony tenderness, swelling and laceration (macerated skin to medial aspect of 5th digit). There is normal range of motion.  R foot with erythema and warmth to the dorsum of the mid-foot extending toward  the toes, with mild TTP diffusely in the dorsal aspect of forefoot and 5th digit, with macerated skin to the medial aspect of the 5th digit, and blackened area to the distal tip of the digit which could be eschar vs scab, FROM intact in all digits, sensation grossly intact, DP pulse diminished to palpation but confirmed with doppler and adequate flow noted. Cap refill unable to be assessed due to the condition of the distal tip of the toe.  SEE PICTURE BELOW  Neurological: He is alert and oriented to person, place, and time. He has normal strength. No sensory deficit.  Skin: Skin is warm, dry and intact. No rash noted. There is erythema.  R foot erythema as noted above and pictured below  Psychiatric: He has a normal mood and affect.  Nursing note and vitals reviewed.       ED Course  Procedures (including critical care time) Labs Review Labs Reviewed  CBC WITH DIFFERENTIAL/PLATELET - Abnormal; Notable for the following:    WBC 15.1 (*)    RBC 3.57 (*)    Hemoglobin 9.7 (*)    HCT 30.8 (*)    Neutro Abs 12.2 (*)    All other components within normal limits  BASIC METABOLIC PANEL - Abnormal; Notable for the following:    Chloride 99 (*)    Glucose, Bld 146 (*)    All other components within normal limits  SEDIMENTATION RATE - Abnormal; Notable for the following:    Sed Rate 73 (*)    All other components within normal limits  C-REACTIVE PROTEIN    Imaging Review Dg Foot Complete Right  04/02/2015  CLINICAL DATA:  Right pinky toe is black, with dorsal soft tissue swelling and erythema along the foot. Initial encounter. EXAM: RIGHT FOOT COMPLETE - 3+ VIEW COMPARISON:  None. FINDINGS: There is no evidence of fracture or dislocation. No definite osseous erosion is characterized, though evaluation for osteomyelitis is limited on radiograph. The joint spaces are preserved. There is no evidence of talar subluxation; the subtalar joint is unremarkable in appearance. An os peroneum is seen.  Mild dorsal soft tissue swelling is noted. IMPRESSION: 1. No evidence of fracture or dislocation. No definite osseous erosion seen, though evaluation for osteomyelitis is limited on radiograph. 2. Os peroneum seen. Electronically Signed   By: Garald Balding M.D.   On: 04/02/2015 06:47   I have personally reviewed and evaluated these images and lab results as part of my medical decision-making.   EKG Interpretation None      MDM   Final diagnoses:  Diabetic foot  infection ([place])  Right foot pain  Anemia, unspecified anemia type  Neutrophilic leukocytosis  Hyperglycemia    60 y.o. male here with R foot pain x3 months, with blackened portion of the toenail/distal toe which seems to be eschar/scab, and macerated skin to the medial aspect of the 5th digit. Doppler pulses strong, although difficult to palpate. +Erythema and warmth to the foot, concerning for developing infection/osteomyelitis. Sensation and strength intact, FROM intact. Will get labs, ESR/CRP, and xray imaging. Will give pain meds and reassess. Will likely need surgical consult.   8:07 AM CBC w/diff showing leukocytosis WBC 91.6 with neutrophilic predominance, and worsening anemia which could be due to the infection. BMP with gluc 146 but otherwise WNL. ESR/CRP pending. Xray negative for osteomyelitis although it is a limited exam. He could potentially need MRI to definitively diagnose this. Given the erythema/warmth and wound, will admit for IV abx. Will start on diabetic foot wound protocol antibiotics of vanc/zosyn. Will consult for admission. Dr. Deno Etienne (attending) in to see pt and agrees with plan.   8:25 AM Dr. [name] returning page, will admit. Of note, ESR just resulted at 73, raising concern for osteomyelitis as well. Please see admission notes for further documentation of care.  BP 148/77 mmHg  Pulse 95  Temp(Src) 97.9 F (36.6 C) (Oral)  Resp 20  SpO2 95%  Meds ordered this encounter  Medications  .  morphine 4 MG/ML injection 4 mg    Sig:   . piperacillin-tazobactam (ZOSYN) IVPB 3.375 g    Sig:     Order Specific Question:  Antibiotic Indication:    Answer:  Cellulitis  . vancomycin (VANCOCIN) IVPB 1000 mg/200 mL premix    Sig:     Order Specific Question:  Indication:    Answer:  Cellulitis  . vancomycin (VANCOCIN) 1,500 mg in sodium chloride 0.9 % 500 mL IVPB    Sig:     Order Specific Question:  Indication:    Answer:  Cellulitis     Vincient Vanaman Camprubi-Soms, PA-C 04/02/15 9450  Merryl Hacker, MD 04/03/15 [phone]

## 2015-04-02 NOTE — ED Notes (Signed)
Patient complains of right pinky toe pain for 4-5 months, the staff was unaware that he had a hurt toe

## 2015-04-03 DIAGNOSIS — I96 Gangrene, not elsewhere classified: Secondary | ICD-10-CM

## 2015-04-03 DIAGNOSIS — D649 Anemia, unspecified: Secondary | ICD-10-CM | POA: Diagnosis present

## 2015-04-03 DIAGNOSIS — D729 Disorder of white blood cells, unspecified: Secondary | ICD-10-CM

## 2015-04-03 LAB — COMPREHENSIVE METABOLIC PANEL
ALT: 15 U/L — AB (ref 17–63)
AST: 14 U/L — AB (ref 15–41)
Albumin: 3.3 g/dL — ABNORMAL LOW (ref 3.5–5.0)
Alkaline Phosphatase: 62 U/L (ref 38–126)
Anion gap: 8 (ref 5–15)
BILIRUBIN TOTAL: 0.3 mg/dL (ref 0.3–1.2)
BUN: 16 mg/dL (ref 6–20)
CALCIUM: 8.9 mg/dL (ref 8.9–10.3)
CO2: 28 mmol/L (ref 22–32)
CREATININE: 0.68 mg/dL (ref 0.61–1.24)
Chloride: 106 mmol/L (ref 101–111)
GFR calc Af Amer: >60 mL/min (ref >60)
Glucose, Bld: 145 mg/dL — ABNORMAL HIGH (ref 65–99)
Potassium: 4.4 mmol/L (ref 3.5–5.1)
Sodium: 142 mmol/L (ref 135–145)
TOTAL PROTEIN: 6.8 g/dL (ref 6.5–8.1)

## 2015-04-03 LAB — CBC
HCT: 35.1 % — ABNORMAL LOW (ref 39.0–52.0)
Hemoglobin: 10.7 g/dL — ABNORMAL LOW (ref 13.0–17.0)
MCH: 27.3 pg (ref 26.0–34.0)
MCHC: 30.5 g/dL (ref 30.0–36.0)
MCV: 89.5 fL (ref 78.0–100.0)
PLATELETS: 281 10*3/uL (ref 150–400)
RBC: 3.92 MIL/uL — ABNORMAL LOW (ref 4.22–5.81)
RDW: 14.1 % (ref 11.5–15.5)
WBC: 8.6 10*3/uL (ref 4.0–10.5)

## 2015-04-03 LAB — GLUCOSE, CAPILLARY
GLUCOSE-CAPILLARY: 108 mg/dL — AB (ref 65–99)
Glucose-Capillary: 185 mg/dL — ABNORMAL HIGH (ref 65–99)
Glucose-Capillary: 139 mg/dL — ABNORMAL HIGH (ref 65–99)
Glucose-Capillary: 146 mg/dL — ABNORMAL HIGH (ref 65–99)

## 2015-04-03 LAB — FOLATE: FOLATE: 13.4 ng/mL (ref >5.9)

## 2015-04-03 LAB — IRON AND TIBC
Iron: 46 ug/dL (ref 45–182)
Saturation Ratios: 18 % (ref 17.9–39.5)
TIBC: 259 ug/dL (ref 250–450)
UIBC: 213 ug/dL

## 2015-04-03 LAB — HIV ANTIBODY (ROUTINE TESTING W REFLEX): HIV Screen 4th Generation wRfx: NONREACTIVE

## 2015-04-03 LAB — RETICULOCYTES
RBC.: 3.92 MIL/uL — AB (ref 4.22–5.81)
RETIC CT PCT: 1.7 % (ref 0.4–3.1)
Retic Count, Absolute: 66.6 10*3/uL (ref 19.0–186.0)

## 2015-04-03 LAB — FERRITIN: FERRITIN: 135 ng/mL (ref 24–336)

## 2015-04-03 LAB — VITAMIN B12: VITAMIN B 12: 338 pg/mL (ref 180–914)

## 2015-04-03 MED ORDER — SODIUM CHLORIDE 0.9 % IV SOLN
INTRAVENOUS | Status: DC
  Administered 2015-04-03 – 2015-04-06 (×4): via INTRAVENOUS
  Administered 2015-04-06: 75 mL/h via INTRAVENOUS
  Administered 2015-04-08: 13:00:00 via INTRAVENOUS

## 2015-04-03 NOTE — Clinical Social Work Note (Signed)
Clinical Social Work Assessment  Patient Details  Name: Craig Bentley MRN: [id number] Date of Birth: Aug 09, 1955  Date of referral:  04/03/15               Reason for consult:  Facility Placement, Discharge Planning                Permission sought to share information with:  Facility Art therapist granted to share information::  Yes, Verbal Permission Granted  Name::        Agency::     Relationship::     Contact Information:     Housing/Transportation Living arrangements for the past 2 months:  [place] of Information:  Patient, Facility Patient Interpreter Needed:  None Criminal Activity/Legal Involvement Pertinent to Current Situation/Hospitalization:  No - Comment as needed Significant Relationships:  Siblings Lives with:  Facility Resident Do you feel safe going back to the place where you live?  Yes Need for family participation in patient care:  No (Coment)  Care giving concerns:  No concerns reported at this time.   Social Worker assessment / plan:  Pt hospitalized on 04/02/15 from [place] with a diabetic foot infection. CSW met with pt to assist with d/c planning. Pt would like to return to [facility] at d/c. Pt gave CSW permission to contact ALF and provide clinical info. CSW will maintain contact with ALF to assist with d/c planning to ALF at d/c.  Employment status:  Disabled (Comment on whether or not currently receiving Disability) Insurance information:  Medicare, Medicaid In [place] PT Recommendations:  Not assessed at this time Information / Referral to community resources:     Patient/Family's Response to care:  Pt would like to return to ALF at d/c.  Patient/Family's Understanding of and Emotional Response to Diagnosis, Current Treatment, and Prognosis:  Pt is anxious regarding his foot infection and treatment plan. " Am I going to need surgery ? " Pt reassured his MD will see him today and answer his  questions.  Emotional Assessment Appearance:  Appears stated age Attitude/Demeanor/Rapport:  Other (cooperative) Affect (typically observed):  Appropriate, Anxious Orientation:  Oriented to Self, Oriented to Place, Oriented to  Time, Oriented to Situation Alcohol / Substance use:  Not Applicable Psych involvement (Current and /or in the community):  No (Comment)  Discharge Needs  Concerns to be addressed:  Discharge Planning Concerns Readmission within the last 30 days:  No Current discharge risk:  None Barriers to Discharge:  No Barriers Identified   Luretha Rued, [place] 04/03/2015, 9:45 AM

## 2015-04-03 NOTE — Progress Notes (Signed)
[facility] PROGRESS NOTE  Craig Bentley OQH:[id number] DOB: 1956/01/07 DOA: 04/02/2015 PCP: Mabeline Caras, NP  Length of stay: 1   Assessment/Plan: Principal Problem:   Diabetic foot infection ([place]) Active Problems:   Essential hypertension   Diabetes mellitus with neurologic complication, without long-term current use of insulin (HCC)   Right foot pain   Neutrophilic leukocytosis   Absolute anemia    HPI: Craig Bentley is a 60 y.o. male with a past medical history of schizophrenia, diabetes on oral agents, hypertension, who lives in a group home and was seen in the emergency department due to complaints of pain in the right foot, especially in the fifth toe area. Patient is a poor historian, probably due to his psychiatric illness. He tells me that he's had problems in his right foot since Thanksgiving. Unclear if he has sought any attention for this before. He complains of pain in the right foot. Complains of some chills at times. Denies any nausea, vomiting. No injuries. No falls. No passing out spells. After he received morphine in the emergency department he is feeling better. Evaluation in the emergency department revealed infected fifth right toe. We were called for further management  Assessment and plan #1 Diabetic foot infection without osteomyelitis of the right fifth toe: Continue IV antibiotics. Day #2 of vancomycin, cefepime and Flagyl. Arterial Doppler shows ABI of 0.27 on the right, 0.43 on the left. Vascular surgery Dr. Bridgett Larsson has been consulted.   #2 History of type 2 diabetes on oral agents: Sliding scale insulin coverage will be initiated. Hemoglobin A1c pending. Continue NovoLog, will start patient on low-dose Lantus, hold metformin   #3 history of schizophrenia: Continue with his home medications. Patient's mental status appears to be at baseline.  #4 Normocytic anemia: Monitor hemoglobins closely. Anemia panel in the morning.  #5 hypertension-continue  lisinopril, continue metoprolol. hold HCTZ and start patient on IV hydration in case the patient needs surgical intervention   DVT prophylaxsis Lovenox  Code Status:      Code Status Orders    Full code    Start     Ordered     Family Communication: Discussed in detail with the patient, all imaging results, lab results explained to the patient   Disposition Plan:  Vascular surgery consultation today, continue IV antibiotics    Consultants:  Vascular surgery    Procedures:  None    Antibiotics: Anti-infectives    Start     Dose/Rate Route Frequency Ordered Stop   04/02/15 1800  vancomycin (VANCOCIN) 1,500 mg in sodium chloride 0.9 % 500 mL IVPB     1,500 mg 250 mL/hr over 120 Minutes Intravenous Every 12 hours 04/02/15 0823     04/02/15 1400  metroNIDAZOLE (FLAGYL) tablet 500 mg     500 mg Oral 3 times per day 04/02/15 1049     04/02/15 1200  ceFEPIme (MAXIPIME) 2 g in dextrose 5 % 50 mL IVPB     2 g 100 mL/hr over 30 Minutes Intravenous Every 8 hours 04/02/15 1100     04/02/15 0815  piperacillin-tazobactam (ZOSYN) IVPB 3.375 g     3.375 g 100 mL/hr over 30 Minutes Intravenous  Once 04/02/15 0807 04/02/15 0937   04/02/15 0815  vancomycin (VANCOCIN) IVPB 1000 mg/200 mL premix     1,000 mg 200 mL/hr over 60 Minutes Intravenous  Once 04/02/15 0807 04/02/15 1038         HPI/Subjective: Requesting more pain medication for  his right foot pain, afebrile and stable  Objective: Filed Vitals:   04/02/15 2202 04/02/15 2224 04/03/15 0443 04/03/15 0941  BP:  120/49 158/70 158/70  Pulse:  78 90   Temp:  98.5 F (36.9 C) 98.8 F (37.1 C)   TempSrc:  Oral Oral   Resp:  18 18   Height:      Weight:      SpO2: 94% 94% 92%     Intake/Output Summary (Last 24 hours) at 04/03/15 1112 Last data filed at 04/03/15 3790  Gross per 24 hour  Intake 2768.75 ml  Output   1600 ml  Net 1168.75 ml    Exam:  General: No acute respiratory distress Lungs: Clear to  auscultation bilaterally without wheezes or crackles Cardiovascular: Regular rate and rhythm without murmur gallop or rub normal S1 and S2 Abdomen: Nontender, nondistended, soft, bowel sounds positive, no rebound, no ascites, no appreciable mass Extremities: Macerated appearance of the right fifth toe with some erythema. Some warmth is noted over the right forefoot. Pulses are present but poorly palpable     Data Review   Micro Results No results found for this or any previous visit (from the past 240 hour(s)).  Radiology Reports Mr Foot Right W Wo Contrast  04/02/2015  CLINICAL DATA:  Diabetic foot infection.  Fifth digit is black. EXAM: MRI OF THE RIGHT FOREFOOT WITHOUT AND WITH CONTRAST TECHNIQUE: Multiplanar, multisequence MR imaging was performed both before and after administration of intravenous contrast. CONTRAST:  72m MULTIHANCE GADOBENATE DIMEGLUMINE 529 MG/ML IV SOLN COMPARISON:  None. FINDINGS: Minimal marrow edema in the first distal phalanx without cortical destruction. No other marrow signal abnormality. No acute fracture dislocation. No bone destruction or periosteal reaction. Mild osteoarthritis of the first MTP joint. There is no soft tissue fluid collection or hematoma. There is mild generalized soft tissue edema along the dorsal aspect of the midfoot with mild enhancement which may reflect reactive edema versus mild cellulitis. The muscles are normal. The visualized flexor and extensor tendons are intact. IMPRESSION: 1. No evidence of osteomyelitis of the right foot. Electronically Signed   By: HKathreen Devoid  On: 04/02/2015 18:44   Dg Foot Complete Right  04/02/2015  CLINICAL DATA:  Right pinky toe is black, with dorsal soft tissue swelling and erythema along the foot. Initial encounter. EXAM: RIGHT FOOT COMPLETE - 3+ VIEW COMPARISON:  None. FINDINGS: There is no evidence of fracture or dislocation. No definite osseous erosion is characterized, though evaluation for osteomyelitis  is limited on radiograph. The joint spaces are preserved. There is no evidence of talar subluxation; the subtalar joint is unremarkable in appearance. An os peroneum is seen. Mild dorsal soft tissue swelling is noted. IMPRESSION: 1. No evidence of fracture or dislocation. No definite osseous erosion seen, though evaluation for osteomyelitis is limited on radiograph. 2. Os peroneum seen. Electronically Signed   By: JGarald BaldingM.D.   On: 04/02/2015 06:47     CBC  Recent Labs Lab 04/02/15 0659 04/03/15 0356  WBC 15.1* 8.6  HGB 9.7* 10.7*  HCT 30.8* 35.1*  PLT 329 281  MCV 86.3 89.5  MCH 27.2 27.3  MCHC 31.5 30.5  RDW 13.9 14.1  LYMPHSABS 2.1  --   MONOABS 0.8  --   EOSABS 0.0  --   BASOSABS 0.0  --     Chemistries   Recent Labs Lab 04/02/15 0659 04/03/15 0356  NA 138 142  K 4.4 4.4  CL 99* 106  CO2 28 28  GLUCOSE 146* 145*  BUN 15 16  CREATININE 0.82 0.68  CALCIUM 9.4 8.9  AST  --  14*  ALT  --  15*  ALKPHOS  --  62  BILITOT  --  0.3   ------------------------------------------------------------------------------------------------------------------ estimated creatinine clearance is 104.6 mL/min (by C-G formula based on Cr of 0.68). ------------------------------------------------------------------------------------------------------------------ No results for input(s): HGBA1C in the last 72 hours. ------------------------------------------------------------------------------------------------------------------ No results for input(s): CHOL, HDL, LDLCALC, TRIG, CHOLHDL, LDLDIRECT in the last 72 hours. ------------------------------------------------------------------------------------------------------------------ No results for input(s): TSH, T4TOTAL, T3FREE, THYROIDAB in the last 72 hours.  Invalid input(s): FREET3 ------------------------------------------------------------------------------------------------------------------  Recent Labs  04/03/15 0356   VITAMINB12 338  FOLATE 13.4  FERRITIN 135  TIBC 259  IRON 46  RETICCTPCT 1.7    Coagulation profile No results for input(s): INR, PROTIME in the last 168 hours.  No results for input(s): DDIMER in the last 72 hours.  Cardiac Enzymes No results for input(s): CKMB, TROPONINI, MYOGLOBIN in the last 168 hours.  Invalid input(s): CK ------------------------------------------------------------------------------------------------------------------ Invalid input(s): POCBNP   CBG:  Recent Labs Lab 04/02/15 1122 04/02/15 2223 04/03/15 0743  GLUCAP 152* 232* 185*       Studies: Mr Foot Right W Wo Contrast  04/02/2015  CLINICAL DATA:  Diabetic foot infection.  Fifth digit is black. EXAM: MRI OF THE RIGHT FOREFOOT WITHOUT AND WITH CONTRAST TECHNIQUE: Multiplanar, multisequence MR imaging was performed both before and after administration of intravenous contrast. CONTRAST:  43m MULTIHANCE GADOBENATE DIMEGLUMINE 529 MG/ML IV SOLN COMPARISON:  None. FINDINGS: Minimal marrow edema in the first distal phalanx without cortical destruction. No other marrow signal abnormality. No acute fracture dislocation. No bone destruction or periosteal reaction. Mild osteoarthritis of the first MTP joint. There is no soft tissue fluid collection or hematoma. There is mild generalized soft tissue edema along the dorsal aspect of the midfoot with mild enhancement which may reflect reactive edema versus mild cellulitis. The muscles are normal. The visualized flexor and extensor tendons are intact. IMPRESSION: 1. No evidence of osteomyelitis of the right foot. Electronically Signed   By: HKathreen Devoid  On: 04/02/2015 18:44   Dg Foot Complete Right  04/02/2015  CLINICAL DATA:  Right pinky toe is black, with dorsal soft tissue swelling and erythema along the foot. Initial encounter. EXAM: RIGHT FOOT COMPLETE - 3+ VIEW COMPARISON:  None. FINDINGS: There is no evidence of fracture or dislocation. No definite osseous  erosion is characterized, though evaluation for osteomyelitis is limited on radiograph. The joint spaces are preserved. There is no evidence of talar subluxation; the subtalar joint is unremarkable in appearance. An os peroneum is seen. Mild dorsal soft tissue swelling is noted. IMPRESSION: 1. No evidence of fracture or dislocation. No definite osseous erosion seen, though evaluation for osteomyelitis is limited on radiograph. 2. Os peroneum seen. Electronically Signed   By: JGarald BaldingM.D.   On: 04/02/2015 06:47      Lab Results  Component Value Date   HGBA1C 5.7* 07/20/2011   HGBA1C * 06/10/2009    6.0 (NOTE)                                                                       According to the ADA  Clinical Practice Recommendations for 2011, when HbA1c is used as a screening test:   >=6.5%   Diagnostic of Diabetes Mellitus           (if abnormal result  is confirmed)  5.7-6.4%   Increased risk of developing Diabetes Mellitus  References:Diagnosis and Classification of Diabetes Mellitus,Diabetes URKY,7062,37(SEGBT 1):S62-S69 and Standards of Medical Care in         Diabetes - 2011,Diabetes DVVO,1607,37  (Suppl 1):S11-S61.   Lab Results  Component Value Date   CREATININE 0.68 04/03/2015       Scheduled Meds: . ceFEPime (MAXIPIME) IV  2 g Intravenous Q8H  . cloZAPine  100 mg Oral BID  . cloZAPine  400 mg Oral QHS  . donepezil  5 mg Oral QHS  . enoxaparin (LOVENOX) injection  40 mg Subcutaneous Q24H  . feeding supplement (GLUCERNA SHAKE)  237 mL Oral BID BM  . gabapentin  300 mg Oral TID  . haloperidol  10 mg Oral QHS  . insulin aspart  0-15 Units Subcutaneous TID WC  . lactulose  30 g Oral BID  . lisinopril  20 mg Oral Daily  . metoprolol tartrate  25 mg Oral BID  . metroNIDAZOLE  500 mg Oral 3 times per day  . pantoprazole  40 mg Oral Daily  . senna  1 tablet Oral BID  . simvastatin  5 mg Oral QHS  . trihexyphenidyl  5 mg Oral BID WC  . vancomycin  1,500 mg Intravenous Q12H    Continuous Infusions: . sodium chloride      Principal Problem:   Diabetic foot infection ([place]) Active Problems:   Essential hypertension   Diabetes mellitus with neurologic complication, without long-term current use of insulin (HCC)   Right foot pain   Neutrophilic leukocytosis   Absolute anemia    Time spent: 45 minutes   [facility] Pager [phone]. If 7PM-7AM, please contact night-coverage at [email or web address], password [facility] 04/03/2015, 11:12 AM  LOS: 1 day

## 2015-04-03 NOTE — Consult Note (Signed)
WOC wound consult note Reason for Consult:Interdigital skin damage in foot with severely diminished blood flor (ABI on right  =  .2) Wound type:Neuropathic Pressure Ulcer POA: No Measurement:2cm x 1cm x 0.2cm depth at lateral aspect 4th digit of right foot with mirror image on medial aspect of 5th digit Wound bed:red, moist with necrotic distal tip of 5th digit Drainage (amount, consistency, odor) dried serosanguinous   Periwound: edematous right foot with warmth, no induration Dressing procedure/placement/frequency: I have provided the nursing staff with guidance and care orders for conservative dressing to be performed daily after cleansing.  I would suggest referral to Vascular or Orthopedics given severity of blood flow in this extremity. If you agree, please order. [place] nursing team will not follow, but will remain available to this patient, the nursing and medical teams.  Please re-consult if needed. Thanks, Maudie Flakes, MSN, RN, [place], Arther Abbott  Pager# [phone]

## 2015-04-03 NOTE — Consult Note (Addendum)
Referred by:  Dr. Allyson Sabal  Reason for referral: right toe gangrene  History of Present Illness  Craig Bentley is a 60 y.o. (12-Nov-1955) male with schizophrenia and diabetes who presents with chief complaint: right foot pain.  Onset of symptom occurred in November 2017.  Pain is described as aching, severity 1-5/10, and associated with movement, rest and manipulating the right foot.  Patient has attempted to treat this pain with rest.  The patient has no rest pain symptoms.  Wounds include: right 5th toe blackness and painful area in R 4th toe.  Atherosclerotic risk factors include: HTN, DM, active smoker.  Past Medical History  Diagnosis Date  . Hypertension   . Diabetes mellitus   . Schizophrenia, schizo-affective ([place])   . COPD (chronic obstructive pulmonary disease) ([place])   . Constipation   . Fecal impaction [facility])     Surgical History: unknown, patient denies   Social History   Social History  . Marital Status: Single    Spouse Name: N/A  . Number of Children: N/A  . Years of Education: N/A   Occupational History  . Not on file.   Social History Main Topics  . Smoking status: Current Every Day Smoker -- 1.50 packs/day    Types: Cigarettes  . Smokeless tobacco: Not on file  . Alcohol Use: No  . Drug Use: No  . Sexual Activity: No   Other Topics Concern  . Not on file   Social History Narrative    Family History  Problem Relation Age of Onset  . Mental illness Mother     Current Facility-Administered Medications  Medication Dose Route Frequency Provider Last Rate Last Dose  . 0.9 %  sodium chloride infusion   Intravenous Continuous Reyne Dumas, MD 75 mL/hr at 04/03/15 1155    . acetaminophen (TYLENOL) tablet 650 mg  650 mg Oral Q6H PRN Bonnielee Haff, MD   650 mg at 04/02/15 1109   Or  . acetaminophen (TYLENOL) suppository 650 mg  650 mg Rectal Q6H PRN Bonnielee Haff, MD      . ceFEPIme (MAXIPIME) 2 g in dextrose 5 % 50 mL IVPB  2 g Intravenous Q8H Angela Adam, RPH   2 g at 04/03/15 1223  . cloZAPine (CLOZARIL) tablet 100 mg  100 mg Oral BID Bonnielee Haff, MD   100 mg at 04/03/15 1619  . cloZAPine (CLOZARIL) tablet 400 mg  400 mg Oral QHS Bonnielee Haff, MD   400 mg at 04/02/15 2140  . dicyclomine (BENTYL) tablet 20 mg  20 mg Oral BID PRN Bonnielee Haff, MD      . donepezil (ARICEPT) tablet 5 mg  5 mg Oral QHS Bonnielee Haff, MD   5 mg at 04/02/15 2140  . enoxaparin (LOVENOX) injection 40 mg  40 mg Subcutaneous Q24H Bonnielee Haff, MD   40 mg at 04/02/15 1837  . feeding supplement (GLUCERNA SHAKE) (GLUCERNA SHAKE) liquid 237 mL  237 mL Oral BID BM Bonnielee Haff, MD   237 mL at 04/03/15 1618  . gabapentin (NEURONTIN) capsule 300 mg  300 mg Oral TID Bonnielee Haff, MD   300 mg at 04/03/15 1619  . haloperidol (HALDOL) tablet 10 mg  10 mg Oral QHS Bonnielee Haff, MD   10 mg at 04/02/15 2141  . insulin aspart (novoLOG) injection 0-15 Units  0-15 Units Subcutaneous TID WC Bonnielee Haff, MD   3 Units at 04/03/15 0809  . ipratropium-albuterol (DUONEB) 0.5-2.5 (3) MG/3ML nebulizer solution 3 mL  3 mL Nebulization Q6H PRN Bonnielee Haff, MD      . lactulose (CHRONULAC) 10 GM/15ML solution 30 g  30 g Oral BID Bonnielee Haff, MD   30 g at 04/03/15 0941  . lisinopril (PRINIVIL,ZESTRIL) tablet 20 mg  20 mg Oral Daily Bonnielee Haff, MD   20 mg at 04/03/15 0941  . metoprolol tartrate (LOPRESSOR) tablet 25 mg  25 mg Oral BID Bonnielee Haff, MD   25 mg at 04/03/15 0941  . metroNIDAZOLE (FLAGYL) tablet 500 mg  500 mg Oral 3 times per day Bonnielee Haff, MD   500 mg at 04/03/15 1430  . morphine 2 MG/ML injection 2 mg  2 mg Intravenous Q3H PRN Bonnielee Haff, MD   2 mg at 04/02/15 1838  . oxyCODONE (Oxy IR/ROXICODONE) immediate release tablet 5 mg  5 mg Oral Q4H PRN Bonnielee Haff, MD   5 mg at 04/03/15 1619  . pantoprazole (PROTONIX) EC tablet 40 mg  40 mg Oral Daily Bonnielee Haff, MD   40 mg at 04/03/15 0941  . polyethylene glycol (MIRALAX / GLYCOLAX) packet  17 g  17 g Oral Daily PRN Bonnielee Haff, MD      . senna (SENOKOT) tablet 8.6 mg  1 tablet Oral BID Bonnielee Haff, MD   8.6 mg at 04/03/15 0941  . simvastatin (ZOCOR) tablet 5 mg  5 mg Oral QHS Bonnielee Haff, MD   5 mg at 04/02/15 2141  . trihexyphenidyl (ARTANE) tablet 5 mg  5 mg Oral BID WC Bonnielee Haff, MD   5 mg at 04/03/15 1619  . vancomycin (VANCOCIN) 1,500 mg in sodium chloride 0.9 % 500 mL IVPB  1,500 mg Intravenous Q12H Emiliano Dyer, RPH   1,500 mg at 04/03/15 0602    No Known Allergies   REVIEW OF SYSTEMS:  (Positives checked otherwise negative)  CARDIOVASCULAR:   '[ ]'$  chest pain,  '[ ]'$  chest pressure,  '[ ]'$  palpitations,  '[ ]'$  shortness of breath when laying flat,  '[ ]'$  shortness of breath with exertion,   '[ ]'$  pain in feet when walking,  '[ ]'$  pain in feet when laying flat, '[ ]'$  history of blood clot in veins (DVT),  '[ ]'$  history of phlebitis,  '[ ]'$  swelling in legs,  '[ ]'$  varicose veins  PULMONARY:   '[ ]'$  productive cough,  '[ ]'$  asthma,  '[ ]'$  wheezing  NEUROLOGIC:   '[ ]'$  weakness in arms or legs,  '[ ]'$  numbness in arms or legs,  '[ ]'$  difficulty speaking or slurred speech,  '[ ]'$  temporary loss of vision in one eye,  '[ ]'$  dizziness  HEMATOLOGIC:   '[ ]'$  bleeding problems,  '[ ]'$  problems with blood clotting too easily  MUSCULOSKEL:   '[ ]'$  joint pain, '[ ]'$  joint swelling  GASTROINTEST:   '[ ]'$  vomiting blood,  '[ ]'$  blood in stool     GENITOURINARY:   '[ ]'$  burning with urination,  '[ ]'$  blood in urine  PSYCHIATRIC:   '[ ]'$  history of major depression '[x]'$  schizophrenia  INTEGUMENTARY:   '[ ]'$  rashes,  '[x]'$  ulcers  CONSTITUTIONAL:   '[ ]'$  fever,  '[ ]'$  chills   For VQI Use Only  PRE-ADM LIVING: Nursing home  AMB STATUS: Ambulatory  CAD Sx: None  PRIOR CHF: None  STRESS TEST: '[x]'$  No, '[ ]'$  Normal, '[ ]'$  + ischemia, '[ ]'$  + MI, '[ ]'$  Both   Physical Examination  Filed Vitals:   04/02/15 2224 04/03/15 0443  04/03/15 0941 04/03/15 1413  BP: 120/49 158/70 158/70 152/76    Pulse: 78 90  88  Temp: 98.5 F (36.9 C) 98.8 F (37.1 C)  98.8 F (37.1 C)  TempSrc: Oral Oral  Oral  Resp: '18 18  18  '$ Height:      Weight:      SpO2: 94% 92%  95%   Body mass index is 25.4 kg/(m^2).  General: A&O x 3, WDWN  Head: [place]/AT  Ear/Nose/Throat: Hearing grossly intact, nares w/o erythema or drainage, oropharynx w/o Erythema/Exudate, Mallampati score: 3  Eyes: PERRLA, EOMI  Neck: Supple, no nuchal rigidity, no palpable LAD  Pulmonary: Sym exp, good air movt, CTAB, no rales, rhonchi, & wheezing  Cardiac: RRR, Nl S1, S2, no Murmurs, rubs or gallops  Vascular: Vessel Right Left  Radial Palpable Palpable  Brachial Palpable Palpable  Carotid Palpable, without bruit Palpable, without bruit  Aorta Not palpable N/A  Femoral Faintly Palpable Not Palpable  Popliteal Not palpable Not palpable  PT Not Palpable Not Palpable  DP Not Palpable Not Palpable   Gastrointestinal: soft, NTND, -G/R, - HSM, - masses, - CVAT B  Musculoskeletal: M/S 5/5 throughout , Extremities without ischemic changes except  R foot: 5th distal phalange with likely dry gangrene, erosion from tip of 5th distal phalange in lateral side of 4th phalange  Neurologic: CN grossly intact , Pain and light touch intact in extremities , Motor exam as listed above  Psychiatric: Judgment intact, Mood & affect appropriate for pt's clinical situation  Dermatologic: See M/S exam for extremity exam, no rashes otherwise noted  Lymph : No Cervical, Axillary, or Inguinal lymphadenopathy    Non-Invasive Vascular Imaging  ABI (Date: 04/02/15)    RIGHT    LEFT    PRESSURE WAVEFORM  PRESSURE WAVEFORM  BRACHIAL 161 Triphasic BRACHIAL 144 Triphasic  DP 43 Dampened Monophasic DP 73 Monophasic  PT 43 Dampened Monophasic PT 66 Monophasic    RIGHT LEFT  ABI 0.27 0.43   ABIs indicate a severe reduction in arterial flow with abnormal Doppler waveforms bilaterally at rest      Laboratory: CBC:    Component Value Date/Time   WBC 8.6 04/03/2015 0356   RBC 3.92* 04/03/2015 0356   RBC 3.92* 04/03/2015 0356   HGB 10.7* 04/03/2015 0356   HCT 35.1* 04/03/2015 0356   PLT 281 04/03/2015 0356   MCV 89.5 04/03/2015 0356   MCH 27.3 04/03/2015 0356   MCHC 30.5 04/03/2015 0356   RDW 14.1 04/03/2015 0356   LYMPHSABS 2.1 04/02/2015 0659   MONOABS 0.8 04/02/2015 0659   EOSABS 0.0 04/02/2015 0659   BASOSABS 0.0 04/02/2015 0659    BMP:    Component Value Date/Time   NA 142 04/03/2015 0356   K 4.4 04/03/2015 0356   CL 106 04/03/2015 0356   CO2 28 04/03/2015 0356   GLUCOSE 145* 04/03/2015 0356   BUN 16 04/03/2015 0356   CREATININE 0.68 04/03/2015 0356   CALCIUM 8.9 04/03/2015 0356   GFRNONAA >60 04/03/2015 0356   GFRAA >60 04/03/2015 0356    Coagulation: Lab Results  Component Value Date   INR 0.96 07/20/2011   No results found for: PTT  Lipids: No results found for: CHOL, TRIG, HDL, CHOLHDL, VLDL, LDLCALC, LDLDIRECT   Radiology: Mr Foot Right W Wo Contrast  04/02/2015  CLINICAL DATA:  Diabetic foot infection.  Fifth digit is black. EXAM: MRI OF THE RIGHT FOREFOOT WITHOUT AND WITH CONTRAST TECHNIQUE: Multiplanar, multisequence MR imaging was performed both  before and after administration of intravenous contrast. CONTRAST:  79m MULTIHANCE GADOBENATE DIMEGLUMINE 529 MG/ML IV SOLN COMPARISON:  None. FINDINGS: Minimal marrow edema in the first distal phalanx without cortical destruction. No other marrow signal abnormality. No acute fracture dislocation. No bone destruction or periosteal reaction. Mild osteoarthritis of the first MTP joint. There is no soft tissue fluid collection or hematoma. There is mild generalized soft tissue edema along the dorsal aspect of the midfoot with mild enhancement which may reflect reactive edema versus mild cellulitis. The muscles are normal. The visualized flexor and extensor tendons are intact. IMPRESSION: 1. No evidence of  osteomyelitis of the right foot. Electronically Signed   By: HKathreen Devoid  On: 04/02/2015 18:44   Dg Foot Complete Right  04/02/2015  CLINICAL DATA:  Right pinky toe is black, with dorsal soft tissue swelling and erythema along the foot. Initial encounter. EXAM: RIGHT FOOT COMPLETE - 3+ VIEW COMPARISON:  None. FINDINGS: There is no evidence of fracture or dislocation. No definite osseous erosion is characterized, though evaluation for osteomyelitis is limited on radiograph. The joint spaces are preserved. There is no evidence of talar subluxation; the subtalar joint is unremarkable in appearance. An os peroneum is seen. Mild dorsal soft tissue swelling is noted. IMPRESSION: 1. No evidence of fracture or dislocation. No definite osseous erosion seen, though evaluation for osteomyelitis is limited on radiograph. 2. Os peroneum seen. Electronically Signed   By: JGarald BaldingM.D.   On: 04/02/2015 06:47    Medical Decision Making  JNoor Vidalesis a 60y.o. 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  Given the limb threatening status of this patient, I recommend an aggressive work up including proceeding with an: Aortogram, Bilateral runoff.  I can't really feel a strong femoral pulse, raising the possibility that this patient has significant iliac disease, will obtain BLE arterial duplex to get a better idea if arm access might be necessary for the aortogram. As I can't feel a left femoral pulse, I doubt he is a candidate for immediate intervention. Will get diagnostic studies first and consider endovascular intervention at a different sitting as an antegrade cannulation in a patient with Schizophrenia likely requires general anesthesia.   I discussed with  the patient the nature of angiographic procedures, especially the limited patencies of any endovascular intervention. The patient is aware of that the risks of an angiographic procedure include but are not limited to: bleeding, infection, access site complications, embolization, rupture of treated vessel, dissection, possible need for emergent surgical intervention, and possible need for surgical procedures to treat the patient's pathology. The patient is aware of the risks and agrees to proceed.  The procedure is scheduled for: Monday 13 FEB 17 as there are  no slots on the schedule Friday. Today this patient was very cooperative with obvious intact mental status.  If this changes, he might need to be rescheduled into the OR setting. I discussed in depth with the patient the nature of atherosclerosis, and emphasized the importance of maximal medical management including strict control of blood pressure, blood glucose, and lipid levels, antiplatelet agents, obtaining regular exercise, and cessation of smoking.  The patient is aware that without maximal medical management the underlying atherosclerotic disease  process will progress, limiting the benefit of any interventions. The patient is currently on a statin:  Zocor. The patient is currently not on an anti-platelet. The patient should be started on ASA 81 mg PO daily. Thank you for allowing [place] to participate in this patient's care. I will be on leave this coming Mon and Tuesday.  Dx angiogram will be done on Monday by Dr. Scot Dock.  I will check in on the patient on Wednesday. Continue wound care as directed by WOC.   Adele Barthel, MD [facility] Office: [phone] Pager: [phone]  04/03/2015, 5:46 PM

## 2015-04-04 ENCOUNTER — Inpatient Hospital Stay (HOSPITAL_COMMUNITY): Payer: Medicaid Other

## 2015-04-04 DIAGNOSIS — E0844 Diabetes mellitus due to underlying condition with diabetic amyotrophy: Secondary | ICD-10-CM

## 2015-04-04 DIAGNOSIS — R52 Pain, unspecified: Secondary | ICD-10-CM

## 2015-04-04 LAB — CBC
HCT: 33.3 % — ABNORMAL LOW (ref 39.0–52.0)
HEMOGLOBIN: 10 g/dL — AB (ref 13.0–17.0)
MCH: 27 pg (ref 26.0–34.0)
MCHC: 30 g/dL (ref 30.0–36.0)
MCV: 89.8 fL (ref 78.0–100.0)
PLATELETS: 284 10*3/uL (ref 150–400)
RBC: 3.71 MIL/uL — ABNORMAL LOW (ref 4.22–5.81)
RDW: 14.2 % (ref 11.5–15.5)
WBC: 8.5 10*3/uL (ref 4.0–10.5)

## 2015-04-04 LAB — COMPREHENSIVE METABOLIC PANEL
ALBUMIN: 3.1 g/dL — AB (ref 3.5–5.0)
ALK PHOS: 65 U/L (ref 38–126)
ALT: 15 U/L — ABNORMAL LOW (ref 17–63)
ANION GAP: 9 (ref 5–15)
AST: 17 U/L (ref 15–41)
BUN: 14 mg/dL (ref 6–20)
CALCIUM: 8.5 mg/dL — AB (ref 8.9–10.3)
CHLORIDE: 105 mmol/L (ref 101–111)
CO2: 27 mmol/L (ref 22–32)
Creatinine, Ser: 0.73 mg/dL (ref 0.61–1.24)
GFR calc non Af Amer: >60 mL/min (ref >60)
GLUCOSE: 204 mg/dL — AB (ref 65–99)
POTASSIUM: 3.9 mmol/L (ref 3.5–5.1)
SODIUM: 141 mmol/L (ref 135–145)
Total Bilirubin: 0.5 mg/dL (ref 0.3–1.2)
Total Protein: 6.3 g/dL — ABNORMAL LOW (ref 6.5–8.1)

## 2015-04-04 LAB — GLUCOSE, CAPILLARY
GLUCOSE-CAPILLARY: 188 mg/dL — AB (ref 65–99)
GLUCOSE-CAPILLARY: 228 mg/dL — AB (ref 65–99)
GLUCOSE-CAPILLARY: 178 mg/dL — AB (ref 65–99)
Glucose-Capillary: 177 mg/dL — ABNORMAL HIGH (ref 65–99)

## 2015-04-04 LAB — HEMOGLOBIN A1C
HEMOGLOBIN A1C: 7 % — AB (ref 4.8–5.6)
MEAN PLASMA GLUCOSE: 154 mg/dL

## 2015-04-04 LAB — VANCOMYCIN, TROUGH: VANCOMYCIN TR: 13 ug/mL (ref 10.0–20.0)

## 2015-04-04 MED ORDER — INSULIN GLARGINE 100 UNIT/ML ~~LOC~~ SOLN
10.0000 [IU] | Freq: Every day | SUBCUTANEOUS | Status: DC
  Administered 2015-04-04 – 2015-04-14 (×11): 10 [IU] via SUBCUTANEOUS
  Filled 2015-04-04 (×13): qty 0.1

## 2015-04-04 MED ORDER — LISINOPRIL 5 MG PO TABS
5.0000 mg | ORAL_TABLET | Freq: Every day | ORAL | Status: DC
  Administered 2015-04-05 – 2015-04-06 (×2): 5 mg via ORAL
  Filled 2015-04-04 (×2): qty 1

## 2015-04-04 MED ORDER — ASPIRIN EC 81 MG PO TBEC
81.0000 mg | DELAYED_RELEASE_TABLET | Freq: Every day | ORAL | Status: DC
  Administered 2015-04-04 – 2015-04-15 (×11): 81 mg via ORAL
  Filled 2015-04-04 (×12): qty 1

## 2015-04-04 NOTE — Progress Notes (Signed)
Pharmacy Antibiotic Note  Craig Bentley is a 61 y.o. male admitted on 04/02/2015 with diabetic foot infection with high risk for MRSA.  Pharmacy has been consulted for vancomycin, cefepime and metronidazole dosing.  Plan:  Currently vanc '1500mg'$  IV q12h. Will check vanc trough tonight before 1800 dose and adjust accordingly.  Continue cefepime 2gm IV q8h  Continue metronidazole '500mg'$  po q8h  Monitor renal function  Height: '5\' 11"'$  (180.3 cm) Weight: 182 lb (82.555 kg) IBW/kg (Calculated) : 75.3  Temp (24hrs), Avg:99.3 F (37.4 C), Min:98.8 F (37.1 C), Max:100.3 F (37.9 C)   Recent Labs Lab 04/02/15 0659 04/03/15 0356 04/04/15 0420  WBC 15.1* 8.6 8.5  CREATININE 0.82 0.68 0.73    Estimated Creatinine Clearance: 104.6 mL/min (by C-G formula based on Cr of 0.73).    No Known Allergies  Antimicrobials this admission: 2/8 >> vancomycin >> 2/8 >> zosyn x 1 >> 2/8 >> cefepime >> 2/8 >> flagyl >> Dose adjustments this admission: 2/10 @ 1700 ______  Microbiology results: None ordered  Thank you for allowing pharmacy to be a part of this patient's care.  Dolly Rias RPh 04/04/2015, 12:58 PM Pager [phone]

## 2015-04-04 NOTE — Progress Notes (Addendum)
[facility] PROGRESS NOTE  Craig Bentley UDJ:[id number] DOB: [birth year] DOA: 04/02/2015 PCP: Craig Caras, NP  Length of stay: 2   Assessment/Plan: Principal Problem:   Diabetic foot infection ([place]) Active Problems:   Essential hypertension   Diabetes mellitus with neurologic complication, without long-term current use of insulin (HCC)   Right foot pain   Neutrophilic leukocytosis   Absolute anemia    HPI: Craig Bentley is a 60 y.o. male with a past medical history of schizophrenia, diabetes on oral agents, hypertension, who lives in a group home and was seen in the emergency department due to complaints of pain in the right foot, especially in the fifth toe area. Patient is a poor historian, probably due to his psychiatric illness. He tells me that he's had problems in his right foot since Thanksgiving. Unclear if he has sought any attention for this before. He complains of pain in the right foot. Complains of some chills at times. Denies any nausea, vomiting. No injuries. No falls. No passing out spells. After he received morphine in the emergency department he is feeling better. Evaluation in the emergency department revealed infected fifth right toe. We were called for further management  Assessment and plan #1 Diabetic foot infection without osteomyelitis of the right fifth toe: Continue IV antibiotics. Day #3 of vancomycin, cefepime and Flagyl. Arterial Doppler shows ABI of 0.27 on the right, 0.43 on the left. Vascular surgery Dr. Bridgett Larsson has been consulted.  Dx angiogram will be done on Monday by Dr. Scot Dock.started on ASA 81 mg PO daily  #2 History of type 2 diabetes on oral agents: Sliding scale insulin coverage will be initiated. Hemoglobin A1c 7.0 . Continue NovoLog, will start patient on low-dose Lantus, hold metformin   #3 history of schizophrenia: Continue with his home medications. Patient's mental status appears to be at baseline.  #4 Normocytic anemia: Monitor  hemoglobins closely. Anemia panel in the morning.   #5 hypertension-continue lisinopril, continue metoprolol. hold HCTZ and start patient on IV hydration in case the patient needs surgical intervention.. May need to hold ACE inhibitor prior to angiogram on Monday.   DVT prophylaxsis Lovenox  Code Status:      Code Status Orders    Full code    Start     Ordered     Family Communication: Discussed in detail with the patient, all imaging results, lab results explained to the patient   Disposition Plan:  Dx angiogram will be done on Monday by Dr. Scot Dock., continue IV antibiotics    Consultants:  Vascular surgery    Procedures:  None    Antibiotics: Anti-infectives    Start     Dose/Rate Route Frequency Ordered Stop   04/02/15 1800  vancomycin (VANCOCIN) 1,500 mg in sodium chloride 0.9 % 500 mL IVPB     1,500 mg 250 mL/hr over 120 Minutes Intravenous Every 12 hours 04/02/15 0823     04/02/15 1400  metroNIDAZOLE (FLAGYL) tablet 500 mg     500 mg Oral 3 times per day 04/02/15 1049     04/02/15 1200  ceFEPIme (MAXIPIME) 2 g in dextrose 5 % 50 mL IVPB     2 g 100 mL/hr over 30 Minutes Intravenous Every 8 hours 04/02/15 1100     04/02/15 0815  piperacillin-tazobactam (ZOSYN) IVPB 3.375 g     3.375 g 100 mL/hr over 30 Minutes Intravenous  Once 04/02/15 0807 04/02/15 0937   04/02/15 0815  vancomycin (VANCOCIN) IVPB 1000  mg/200 mL premix     1,000 mg 200 mL/hr over 60 Minutes Intravenous  Once 04/02/15 0807 04/02/15 1038         HPI/Subjective: Continues to have low grade fevers, denies any foot pain  Objective: Filed Vitals:   04/03/15 1413 04/03/15 2100 04/04/15 0555 04/04/15 1018  BP: 152/76 140/68 115/57 131/66  Pulse: 88 96 87 92  Temp: 98.8 F (37.1 C) 98.9 F (37.2 C) 100.3 F (37.9 C)   TempSrc: Oral Oral Oral   Resp: '18 16 16   '$ Height:      Weight:      SpO2: 95% 96% 97%     Intake/Output Summary (Last 24 hours) at 04/04/15 1343 Last data  filed at 04/04/15 1300  Gross per 24 hour  Intake   3935 ml  Output   2971 ml  Net    964 ml    Exam:  General: No acute respiratory distress Lungs: Clear to auscultation bilaterally without wheezes or crackles Cardiovascular: Regular rate and rhythm without murmur gallop or rub normal S1 and S2 Abdomen: Nontender, nondistended, soft, bowel sounds positive, no rebound, no ascites, no appreciable mass Extremities: Macerated appearance of the right fifth toe with some erythema. Some warmth is noted over the right forefoot. Pulses are present but poorly palpable     Data Review   Micro Results No results found for this or any previous visit (from the past 240 hour(s)).  Radiology Reports Mr Foot Right W Wo Contrast  04/02/2015  CLINICAL DATA:  Diabetic foot infection.  Fifth digit is black. EXAM: MRI OF THE RIGHT FOREFOOT WITHOUT AND WITH CONTRAST TECHNIQUE: Multiplanar, multisequence MR imaging was performed both before and after administration of intravenous contrast. CONTRAST:  71m MULTIHANCE GADOBENATE DIMEGLUMINE 529 MG/ML IV SOLN COMPARISON:  None. FINDINGS: Minimal marrow edema in the first distal phalanx without cortical destruction. No other marrow signal abnormality. No acute fracture dislocation. No bone destruction or periosteal reaction. Mild osteoarthritis of the first MTP joint. There is no soft tissue fluid collection or hematoma. There is mild generalized soft tissue edema along the dorsal aspect of the midfoot with mild enhancement which may reflect reactive edema versus mild cellulitis. The muscles are normal. The visualized flexor and extensor tendons are intact. IMPRESSION: 1. No evidence of osteomyelitis of the right foot. Electronically Signed   By: HKathreen Devoid  On: 04/02/2015 18:44   Dg Foot Complete Right  04/02/2015  CLINICAL DATA:  Right pinky toe is black, with dorsal soft tissue swelling and erythema along the foot. Initial encounter. EXAM: RIGHT FOOT COMPLETE -  3+ VIEW COMPARISON:  None. FINDINGS: There is no evidence of fracture or dislocation. No definite osseous erosion is characterized, though evaluation for osteomyelitis is limited on radiograph. The joint spaces are preserved. There is no evidence of talar subluxation; the subtalar joint is unremarkable in appearance. An os peroneum is seen. Mild dorsal soft tissue swelling is noted. IMPRESSION: 1. No evidence of fracture or dislocation. No definite osseous erosion seen, though evaluation for osteomyelitis is limited on radiograph. 2. Os peroneum seen. Electronically Signed   By: JGarald BaldingM.D.   On: 04/02/2015 06:47     CBC  Recent Labs Lab 04/02/15 0659 04/03/15 0356 04/04/15 0420  WBC 15.1* 8.6 8.5  HGB 9.7* 10.7* 10.0*  HCT 30.8* 35.1* 33.3*  PLT 329 281 284  MCV 86.3 89.5 89.8  MCH 27.2 27.3 27.0  MCHC 31.5 30.5 30.0  RDW 13.9 14.1  14.2  LYMPHSABS 2.1  --   --   MONOABS 0.8  --   --   EOSABS 0.0  --   --   BASOSABS 0.0  --   --     Chemistries   Recent Labs Lab 04/02/15 0659 04/03/15 0356 04/04/15 0420  NA 138 142 141  K 4.4 4.4 3.9  CL 99* 106 105  CO2 '28 28 27  '$ GLUCOSE 146* 145* 204*  BUN '15 16 14  '$ CREATININE 0.82 0.68 0.73  CALCIUM 9.4 8.9 8.5*  AST  --  14* 17  ALT  --  15* 15*  ALKPHOS  --  62 65  BILITOT  --  0.3 0.5   ------------------------------------------------------------------------------------------------------------------ estimated creatinine clearance is 104.6 mL/min (by C-G formula based on Cr of 0.73). ------------------------------------------------------------------------------------------------------------------  Recent Labs  04/03/15 0356  HGBA1C 7.0*   ------------------------------------------------------------------------------------------------------------------ No results for input(s): CHOL, HDL, LDLCALC, TRIG, CHOLHDL, LDLDIRECT in the last 72  hours. ------------------------------------------------------------------------------------------------------------------ No results for input(s): TSH, T4TOTAL, T3FREE, THYROIDAB in the last 72 hours.  Invalid input(s): FREET3 ------------------------------------------------------------------------------------------------------------------  Recent Labs  04/03/15 0356  VITAMINB12 338  FOLATE 13.4  FERRITIN 135  TIBC 259  IRON 46  RETICCTPCT 1.7    Coagulation profile No results for input(s): INR, PROTIME in the last 168 hours.  No results for input(s): DDIMER in the last 72 hours.  Cardiac Enzymes No results for input(s): CKMB, TROPONINI, MYOGLOBIN in the last 168 hours.  Invalid input(s): CK ------------------------------------------------------------------------------------------------------------------ Invalid input(s): POCBNP   CBG:  Recent Labs Lab 04/03/15 1205 04/03/15 1614 04/03/15 2149 04/04/15 0739 04/04/15 1141  GLUCAP 108* 139* 146* 177* 188*       Studies: Mr Foot Right W Wo Contrast  04/02/2015  CLINICAL DATA:  Diabetic foot infection.  Fifth digit is black. EXAM: MRI OF THE RIGHT FOREFOOT WITHOUT AND WITH CONTRAST TECHNIQUE: Multiplanar, multisequence MR imaging was performed both before and after administration of intravenous contrast. CONTRAST:  63m MULTIHANCE GADOBENATE DIMEGLUMINE 529 MG/ML IV SOLN COMPARISON:  None. FINDINGS: Minimal marrow edema in the first distal phalanx without cortical destruction. No other marrow signal abnormality. No acute fracture dislocation. No bone destruction or periosteal reaction. Mild osteoarthritis of the first MTP joint. There is no soft tissue fluid collection or hematoma. There is mild generalized soft tissue edema along the dorsal aspect of the midfoot with mild enhancement which may reflect reactive edema versus mild cellulitis. The muscles are normal. The visualized flexor and extensor tendons are intact.  IMPRESSION: 1. No evidence of osteomyelitis of the right foot. Electronically Signed   By: HKathreen Devoid  On: 04/02/2015 18:44      Lab Results  Component Value Date   HGBA1C 7.0* 04/03/2015   HGBA1C 5.7* 07/20/2011   HGBA1C * 06/10/2009    6.0 (NOTE)                                                                       According to the ADA Clinical Practice Recommendations for 2011, when HbA1c is used as a screening test:   >=6.5%   Diagnostic of Diabetes Mellitus           (if abnormal result  is confirmed)  5.7-6.4%   Increased  risk of developing Diabetes Mellitus  References:Diagnosis and Classification of Diabetes Mellitus,Diabetes AXEN,4076,80(SUPJS 1):S62-S69 and Standards of Medical Care in         Diabetes - 2011,Diabetes RPRX,4585,92  (Suppl 1):S11-S61.   Lab Results  Component Value Date   CREATININE 0.73 04/04/2015       Scheduled Meds: . aspirin EC  81 mg Oral Daily  . ceFEPime (MAXIPIME) IV  2 g Intravenous Q8H  . cloZAPine  100 mg Oral BID  . cloZAPine  400 mg Oral QHS  . donepezil  5 mg Oral QHS  . enoxaparin (LOVENOX) injection  40 mg Subcutaneous Q24H  . feeding supplement (GLUCERNA SHAKE)  237 mL Oral BID BM  . gabapentin  300 mg Oral TID  . haloperidol  10 mg Oral QHS  . insulin aspart  0-15 Units Subcutaneous TID WC  . lactulose  30 g Oral BID  . lisinopril  20 mg Oral Daily  . metoprolol tartrate  25 mg Oral BID  . metroNIDAZOLE  500 mg Oral 3 times per day  . pantoprazole  40 mg Oral Daily  . senna  1 tablet Oral BID  . simvastatin  5 mg Oral QHS  . trihexyphenidyl  5 mg Oral BID WC  . vancomycin  1,500 mg Intravenous Q12H   Continuous Infusions: . sodium chloride 75 mL/hr at 04/04/15 1017    Principal Problem:   Diabetic foot infection ([place]) Active Problems:   Essential hypertension   Diabetes mellitus with neurologic complication, without long-term current use of insulin (HCC)   Right foot pain   Neutrophilic leukocytosis   Absolute  anemia    Time spent: 45 minutes   [facility] Pager [phone]. If 7PM-7AM, please contact night-coverage at [email or web address], password [facility] 04/04/2015, 1:43 PM  LOS: 2 days

## 2015-04-04 NOTE — Progress Notes (Signed)
VASCULAR LAB PRELIMINARY  PRELIMINARY  PRELIMINARY  PRELIMINARY  Bilateral lower extremity arterial duplex completed.    Preliminary report:  Right -  Mild to severe heterogeneous plaque throughout. 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 Flow is diminished distally from the point. Left  -  Mild to severe heterogeneous plaque. There is a greater than 50% stenosis in the distal commn femoral artery with diminishing  Flow distally.  Craig Bentley, RVS 04/04/2015, 3:32 PM

## 2015-04-04 NOTE — Progress Notes (Signed)
Protocol: Vancomycin Indication: Diabetic foot infection    Assessment: Pt is currently on Vancomycin '1500mg'$  IV Q12h. Vanco trough level=13 mcg/ml (goal 10-15).   Plan: Will continue Vancomycin '1500mg'$  IV Q12h.  Thanks, Garnet Sierras, PharmD 04/04/2015

## 2015-04-05 LAB — COMPREHENSIVE METABOLIC PANEL
ALBUMIN: 3.2 g/dL — AB (ref 3.5–5.0)
ALT: 17 U/L (ref 17–63)
ANION GAP: 7 (ref 5–15)
AST: 16 U/L (ref 15–41)
Alkaline Phosphatase: 53 U/L (ref 38–126)
BILIRUBIN TOTAL: 0.8 mg/dL (ref 0.3–1.2)
BUN: 13 mg/dL (ref 6–20)
CO2: 28 mmol/L (ref 22–32)
Calcium: 9.1 mg/dL (ref 8.9–10.3)
Chloride: 107 mmol/L (ref 101–111)
Creatinine, Ser: 0.72 mg/dL (ref 0.61–1.24)
GFR calc Af Amer: >60 mL/min (ref >60)
Glucose, Bld: 184 mg/dL — ABNORMAL HIGH (ref 65–99)
POTASSIUM: 4.1 mmol/L (ref 3.5–5.1)
Sodium: 142 mmol/L (ref 135–145)
TOTAL PROTEIN: 6.8 g/dL (ref 6.5–8.1)

## 2015-04-05 LAB — CBC
HEMATOCRIT: 33.5 % — AB (ref 39.0–52.0)
HEMOGLOBIN: 10 g/dL — AB (ref 13.0–17.0)
MCH: 27 pg (ref 26.0–34.0)
MCHC: 29.9 g/dL — ABNORMAL LOW (ref 30.0–36.0)
MCV: 90.5 fL (ref 78.0–100.0)
Platelets: 281 10*3/uL (ref 150–400)
RBC: 3.7 MIL/uL — ABNORMAL LOW (ref 4.22–5.81)
RDW: 14.4 % (ref 11.5–15.5)
WBC: 9.3 10*3/uL (ref 4.0–10.5)

## 2015-04-05 LAB — GLUCOSE, CAPILLARY
GLUCOSE-CAPILLARY: 133 mg/dL — AB (ref 65–99)
GLUCOSE-CAPILLARY: 224 mg/dL — AB (ref 65–99)
Glucose-Capillary: 127 mg/dL — ABNORMAL HIGH (ref 65–99)
Glucose-Capillary: 135 mg/dL — ABNORMAL HIGH (ref 65–99)

## 2015-04-05 NOTE — Progress Notes (Signed)
[facility] PROGRESS NOTE  Gjon Letarte JKD:[id number] DOB: 1955/07/08 DOA: 04/02/2015 PCP: Mabeline Caras, NP  Length of stay: 3   Assessment/Plan: Principal Problem:   Diabetic foot infection ([place]) Active Problems:   Essential hypertension   Diabetes mellitus with neurologic complication, without long-term current use of insulin (HCC)   Right foot pain   Neutrophilic leukocytosis   Absolute anemia    HPI: Yaxiel Minnie is a 60 y.o. male with a past medical history of schizophrenia, diabetes on oral agents, hypertension, who lives in a group home and was seen in the emergency department due to complaints of pain in the right foot, especially in the fifth toe area. Patient is a poor historian, probably due to his psychiatric illness. He tells me that he's had problems in his right foot since Thanksgiving. Unclear if he has sought any attention for this before. He complains of pain in the right foot. Complains of some chills at times. Denies any nausea, vomiting. No injuries. No falls. No passing out spells. After he received morphine in the emergency department he is feeling better. Evaluation in the emergency department revealed infected fifth right toe. We were called for further management  Assessment and plan #1 Diabetic foot infection without osteomyelitis of the right fifth toe: Continue IV antibiotics. Day #4 of vancomycin, cefepime and Flagyl. Arterial Doppler shows ABI of 0.27 on the right, 0.43 on the left. Vascular surgery Dr. Bridgett Larsson has been consulted.  Dx angiogram will be done on Monday by Dr. Scot Dock.started on ASA 81 mg PO daily. Blood culture from 2/10  NGSF  #2 History of type 2 diabetes on oral agents: Continue SSI. Hemoglobin A1c 7.0 . Continue NovoLog, will continue Lantus, hold metformin   #3 history of schizophrenia: Continue with his home medications. Patient's mental status appears to be at baseline.  #4 Normocytic anemia: Monitor hemoglobins closely.  Anemia panel in the morning.   #5 hypertension-continue lisinopril, continue metoprolol. hold HCTZ and started patient on IV hydration due to upcoming angiogram.. May need to hold ACE inhibitor prior to angiogram on Monday.   DVT prophylaxsis Lovenox  Code Status:      Code Status Orders    Full code    Start     Ordered     Family Communication: Discussed in detail with the patient, all imaging results, lab results explained to the patient   Disposition Plan:  Dx angiogram will be done on Monday by Dr. Scot Dock., continue IV antibiotics    Consultants:  Vascular surgery    Procedures:  None    Antibiotics: Anti-infectives    Start     Dose/Rate Route Frequency Ordered Stop   04/02/15 1800  vancomycin (VANCOCIN) 1,500 mg in sodium chloride 0.9 % 500 mL IVPB     1,500 mg 250 mL/hr over 120 Minutes Intravenous Every 12 hours 04/02/15 0823     04/02/15 1400  metroNIDAZOLE (FLAGYL) tablet 500 mg     500 mg Oral 3 times per day 04/02/15 1049     04/02/15 1200  ceFEPIme (MAXIPIME) 2 g in dextrose 5 % 50 mL IVPB     2 g 100 mL/hr over 30 Minutes Intravenous Every 8 hours 04/02/15 1100     04/02/15 0815  piperacillin-tazobactam (ZOSYN) IVPB 3.375 g     3.375 g 100 mL/hr over 30 Minutes Intravenous  Once 04/02/15 0807 04/02/15 0937   04/02/15 0815  vancomycin (VANCOCIN) IVPB 1000 mg/200 mL premix  1,000 mg 200 mL/hr over 60 Minutes Intravenous  Once 04/02/15 0807 04/02/15 1038         HPI/Subjective: Denies pain, some   drainage from the right foot fifth toe  Objective: Filed Vitals:   04/05/15 0525 04/05/15 0700 04/05/15 0940 04/05/15 0946  BP: 147/70 157/72 148/69   Pulse:  87  87  Temp: 98.7 F (37.1 C) 99.2 F (37.3 C)    TempSrc: Oral Oral    Resp: 20 18    Height:      Weight:      SpO2: 96% 95%      Intake/Output Summary (Last 24 hours) at 04/05/15 1006 Last data filed at 04/05/15 1478  Gross per 24 hour  Intake 3817.5 ml  Output   2250  ml  Net 1567.5 ml    Exam:  General: No acute respiratory distress Lungs: Clear to auscultation bilaterally without wheezes or crackles Cardiovascular: Regular rate and rhythm without murmur gallop or rub normal S1 and S2 Abdomen: Nontender, nondistended, soft, bowel sounds positive, no rebound, no ascites, no appreciable mass Extremities: Macerated appearance of the right fifth toe with some erythema. Currently in an [employer]. Some warmth is noted over the right forefoot. Pulses are present but poorly palpable     Data Review   Micro Results Recent Results (from the past 240 hour(s))  Culture, blood (routine x 2)     Status: None (Preliminary result)   Collection Time: 04/04/15  5:02 PM  Result Value Ref Range Status   Specimen Description BLOOD RIGHT ARM  Final   Special Requests BOTTLES DRAWN AEROBIC AND ANAEROBIC 10 CC EA  Final   Culture   Final    NO GROWTH < 12 HOURS Performed at [facility]    Report Status PENDING  Incomplete  Culture, blood (routine x 2)     Status: None (Preliminary result)   Collection Time: 04/04/15  5:20 PM  Result Value Ref Range Status   Specimen Description BLOOD RIGHT HAND  Final   Special Requests BOTTLES DRAWN AEROBIC AND ANAEROBIC 5 CC EA  Final   Culture   Final    NO GROWTH < 12 HOURS Performed at [facility]    Report Status PENDING  Incomplete    Radiology Reports Mr Foot Right W Wo Contrast  04/02/2015  CLINICAL DATA:  Diabetic foot infection.  Fifth digit is black. EXAM: MRI OF THE RIGHT FOREFOOT WITHOUT AND WITH CONTRAST TECHNIQUE: Multiplanar, multisequence MR imaging was performed both before and after administration of intravenous contrast. CONTRAST:  54m MULTIHANCE GADOBENATE DIMEGLUMINE 529 MG/ML IV SOLN COMPARISON:  None. FINDINGS: Minimal marrow edema in the first distal phalanx without cortical destruction. No other marrow signal abnormality. No acute fracture dislocation. No bone destruction or  periosteal reaction. Mild osteoarthritis of the first MTP joint. There is no soft tissue fluid collection or hematoma. There is mild generalized soft tissue edema along the dorsal aspect of the midfoot with mild enhancement which may reflect reactive edema versus mild cellulitis. The muscles are normal. The visualized flexor and extensor tendons are intact. IMPRESSION: 1. No evidence of osteomyelitis of the right foot. Electronically Signed   By: HKathreen Devoid  On: 04/02/2015 18:44   Dg Foot Complete Right  04/02/2015  CLINICAL DATA:  Right pinky toe is black, with dorsal soft tissue swelling and erythema along the foot. Initial encounter. EXAM: RIGHT FOOT COMPLETE - 3+ VIEW COMPARISON:  None. FINDINGS: There is no  evidence of fracture or dislocation. No definite osseous erosion is characterized, though evaluation for osteomyelitis is limited on radiograph. The joint spaces are preserved. There is no evidence of talar subluxation; the subtalar joint is unremarkable in appearance. An os peroneum is seen. Mild dorsal soft tissue swelling is noted. IMPRESSION: 1. No evidence of fracture or dislocation. No definite osseous erosion seen, though evaluation for osteomyelitis is limited on radiograph. 2. Os peroneum seen. Electronically Signed   By: Garald Balding M.D.   On: 04/02/2015 06:47     CBC  Recent Labs Lab 04/02/15 0659 04/03/15 0356 04/04/15 0420 04/05/15 0425  WBC 15.1* 8.6 8.5 9.3  HGB 9.7* 10.7* 10.0* 10.0*  HCT 30.8* 35.1* 33.3* 33.5*  PLT 329 281 284 281  MCV 86.3 89.5 89.8 90.5  MCH 27.2 27.3 27.0 27.0  MCHC 31.5 30.5 30.0 29.9*  RDW 13.9 14.1 14.2 14.4  LYMPHSABS 2.1  --   --   --   MONOABS 0.8  --   --   --   EOSABS 0.0  --   --   --   BASOSABS 0.0  --   --   --     Chemistries   Recent Labs Lab 04/02/15 0659 04/03/15 0356 04/04/15 0420 04/05/15 0425  NA 138 142 141 142  K 4.4 4.4 3.9 4.1  CL 99* 106 105 107  CO2 '28 28 27 28  '$ GLUCOSE 146* 145* 204* 184*  BUN '15 16  14 13  '$ CREATININE 0.82 0.68 0.73 0.72  CALCIUM 9.4 8.9 8.5* 9.1  AST  --  14* 17 16  ALT  --  15* 15* 17  ALKPHOS  --  62 65 53  BILITOT  --  0.3 0.5 0.8   ------------------------------------------------------------------------------------------------------------------ estimated creatinine clearance is 104.6 mL/min (by C-G formula based on Cr of 0.72). ------------------------------------------------------------------------------------------------------------------  Recent Labs  04/03/15 0356  HGBA1C 7.0*   ------------------------------------------------------------------------------------------------------------------ No results for input(s): CHOL, HDL, LDLCALC, TRIG, CHOLHDL, LDLDIRECT in the last 72 hours. ------------------------------------------------------------------------------------------------------------------ No results for input(s): TSH, T4TOTAL, T3FREE, THYROIDAB in the last 72 hours.  Invalid input(s): FREET3 ------------------------------------------------------------------------------------------------------------------  Recent Labs  04/03/15 0356  VITAMINB12 338  FOLATE 13.4  FERRITIN 135  TIBC 259  IRON 46  RETICCTPCT 1.7    Coagulation profile No results for input(s): INR, PROTIME in the last 168 hours.  No results for input(s): DDIMER in the last 72 hours.  Cardiac Enzymes No results for input(s): CKMB, TROPONINI, MYOGLOBIN in the last 168 hours.  Invalid input(s): CK ------------------------------------------------------------------------------------------------------------------ Invalid input(s): POCBNP   CBG:  Recent Labs Lab 04/04/15 0739 04/04/15 1141 04/04/15 1707 04/04/15 2200 04/05/15 0711  GLUCAP 177* 188* 228* 178* 127*       Studies: No results found.    Lab Results  Component Value Date   HGBA1C 7.0* 04/03/2015   HGBA1C 5.7* 07/20/2011   HGBA1C * 06/10/2009    6.0 (NOTE)                                                                        According to the ADA Clinical Practice Recommendations for 2011, when HbA1c is used as a screening test:   >=6.5%   Diagnostic of Diabetes Mellitus           (  if abnormal result  is confirmed)  5.7-6.4%   Increased risk of developing Diabetes Mellitus  References:Diagnosis and Classification of Diabetes Mellitus,Diabetes KAJG,8115,72(IOMBT 1):S62-S69 and Standards of Medical Care in         Diabetes - 2011,Diabetes DHRC,1638,45  (Suppl 1):S11-S61.   Lab Results  Component Value Date   CREATININE 0.72 04/05/2015       Scheduled Meds: . aspirin EC  81 mg Oral Daily  . ceFEPime (MAXIPIME) IV  2 g Intravenous Q8H  . cloZAPine  100 mg Oral BID  . cloZAPine  400 mg Oral QHS  . donepezil  5 mg Oral QHS  . enoxaparin (LOVENOX) injection  40 mg Subcutaneous Q24H  . feeding supplement (GLUCERNA SHAKE)  237 mL Oral BID BM  . gabapentin  300 mg Oral TID  . haloperidol  10 mg Oral QHS  . insulin aspart  0-15 Units Subcutaneous TID WC  . insulin glargine  10 Units Subcutaneous Daily  . lactulose  30 g Oral BID  . lisinopril  5 mg Oral Daily  . metoprolol tartrate  25 mg Oral BID  . metroNIDAZOLE  500 mg Oral 3 times per day  . pantoprazole  40 mg Oral Daily  . senna  1 tablet Oral BID  . simvastatin  5 mg Oral QHS  . trihexyphenidyl  5 mg Oral BID WC  . vancomycin  1,500 mg Intravenous Q12H   Continuous Infusions: . sodium chloride 75 mL/hr at 04/05/15 3646    Principal Problem:   Diabetic foot infection ([place]) Active Problems:   Essential hypertension   Diabetes mellitus with neurologic complication, without long-term current use of insulin (HCC)   Right foot pain   Neutrophilic leukocytosis   Absolute anemia    Time spent: 45 minutes   [facility] Pager [phone]. If 7PM-7AM, please contact night-coverage at [email or web address], password [facility] 04/05/2015, 10:06 AM  LOS: 3 days

## 2015-04-05 NOTE — Care Management Important Message (Signed)
Important Message  Patient Details  Name: Craig Bentley MRN: [id number] Date of Birth: Oct 09, 1955   Medicare Important Message Given:  Yes    Erenest Rasher, RN 04/05/2015, 4:43 PM

## 2015-04-05 NOTE — Care Management Note (Addendum)
Case Management Note  Patient Details  Name: Craig Bentley MRN: [id number] Date of Birth: 1956-01-24  Subjective/Objective:     Diabetic foot infection, Angiogram scheduled 04/07/2015, WOC consult                Action/Plan:  NCM spoke to pt and states he lives at [facility]. Pt states he has RW. Also had [facility] in the past at [place]. Waiting PT recommendations for dc. Pt may need higher level of care at dc. CSW referral for Assisted Living vs SNF placement.     Expected Discharge Date:  04/10/2015               Expected Discharge Plan:  Assisted Living / Rest Home  In-House Referral:  Clinical Social Work  Discharge planning Services  CM Consult  Post Acute Care Choice:  NA Choice offered to:  NA  DME Arranged:  N/A DME Agency:  NA  HH Arranged:  NA HH Agency:  NA  Status of Service:  Completed, signed off  Medicare Important Message Given:    Date Medicare IM Given:    Medicare IM give by:    Date Additional Medicare IM Given:    Additional Medicare Important Message give by:     If discussed at [place] of Stay Meetings, dates discussed:    Additional Comments:  Erenest Rasher, RN 04/05/2015, 4:38 PM

## 2015-04-06 DIAGNOSIS — I96 Gangrene, not elsewhere classified: Secondary | ICD-10-CM

## 2015-04-06 DIAGNOSIS — D638 Anemia in other chronic diseases classified elsewhere: Secondary | ICD-10-CM

## 2015-04-06 LAB — GLUCOSE, CAPILLARY
GLUCOSE-CAPILLARY: 223 mg/dL — AB (ref 65–99)
GLUCOSE-CAPILLARY: 162 mg/dL — AB (ref 65–99)
Glucose-Capillary: 152 mg/dL — ABNORMAL HIGH (ref 65–99)
Glucose-Capillary: 131 mg/dL — ABNORMAL HIGH (ref 65–99)

## 2015-04-06 MED ORDER — INSULIN ASPART 100 UNIT/ML ~~LOC~~ SOLN
3.0000 [IU] | Freq: Every day | SUBCUTANEOUS | Status: DC
  Administered 2015-04-08 – 2015-04-14 (×6): 3 [IU] via SUBCUTANEOUS

## 2015-04-06 NOTE — Progress Notes (Signed)
Transferred to 1525. No changes in assessment. Wynne Jury, [employer]

## 2015-04-06 NOTE — Progress Notes (Signed)
[facility] PROGRESS NOTE  Craig Bentley ONG:[id number] DOB: [birth year] DOA: 04/02/2015 PCP: Vinnie Langton, NP  Brief Summary  The patient is a 60 year old male with history of schizophrenia, IVs mellitus, hypertension who was in a group home and presented to the emergency department because of pain in his right foot, particularly in the fifth toe area which has been turning dark.  In the emergency department, he was found have an infected fifth toe and was admitted for IV antibiotics.  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  -  Continue IV antibiotics -  Arterial Doppler shows ABI of 0.27 on the right, 0.43 on the left. -   appreciate vascular surgery assistance, plan for angiography on Monday  -  Continue aspirin   Diabetes mellitus type 2, hemoglobin A1c 7 -  CBG range from 131-223, typically higher before dinner -  Continue Lantus 10 units daily -  continue aspart moderate dose sliding scale -  Add 3 units before lunch  Schizophrenia, stable, continue home medications  Anemia of chronic disease. Iron level, vitamin B12, folate within normal limits.  Hypertension, stable, continue lisinopril and metoprolol today but agree with holding lisinopril on Monday morning   Diet:   Diabetic and nothing by mouth at midnight Access:  PIV IVF:  yes Proph:  lovenox  Code Status: full Family Communication: patient alone Disposition Plan:  Pending definitive management of fifth toe   Consultants:  Drs. Imogene Burn and Edilia Bo, vascular surgery  Procedures:  ABI  Antibiotics:  Vanc, cefepime, flagyl   HPI/Subjective: Patient states that the pain medication takes his pain down but does not take it away. Pain is tolerable. He continues to be concerned about the appearance of his fifth toe which is black. His foot does not appear to be the normal color also. He denies fevers, chills. Denies chest pain or difficult breathing.  ective: Filed Vitals:   04/05/15 1330 04/05/15 2130 04/06/15 0555 04/06/15 1431  BP: 146/57 128/53 140/65 145/77  Pulse: 84 82 96 86  Temp: 99.4 F (37.4 C) 98.9 F (37.2 C) 98.1 F (36.7 C) 99.1 F (37.3 C)  TempSrc: Oral Oral Oral Oral  Resp: 18 16 19 20   Height:      Weight:      SpO2: 100% 92% 94% 97%    Intake/Output Summary (Last 24 hours) at 04/06/15 1747 Last data filed at 04/06/15 0726  Gross per 24 hour  Intake 2256.25 ml  Output   2475 ml  Net -218.75 ml   Filed Weights   04/02/15 0846  Weight: 82.555 kg (182 lb)   Body mass index is 25.4 kg/(m^2).  Exam:   General:   adult male, No acute distress  HEENT:  NCAT, MMM  Cardiovascular:  RRR, nl S1, S2 no mrg  Respiratory:  CTAB, no increased WOB  Abdomen:   NABS, soft, NT/ND  MSK:   Normal tone and bulk,  foot to just above the ankle on the right side appears somewhat dusky. His fifth toe on that foot is black and the distal portion of it has receded.  Data Reviewed: Basic Metabolic Panel:  Recent Labs Lab 04/02/15 0659 04/03/15 0356 04/04/15 0420 04/05/15 0425  NA 138 142 141 142  K 4.4 4.4 3.9 4.1  CL 99* 106 105 107  CO2 28 28 27 28   GLUCOSE 146* 145* 204* 184*  BUN 15 16 14 13   CREATININE 0.82 0.68 0.73 0.72  CALCIUM 9.4 8.9 8.5* 9.1  Liver Function Tests:  Recent Labs Lab 04/03/15 0356 04/04/15 0420 04/05/15 0425  AST 14* 17 16  ALT 15* 15* 17  ALKPHOS 62 65 53  BILITOT 0.3 0.5 0.8  PROT 6.8 6.3* 6.8  ALBUMIN 3.3* 3.1* 3.2*   No results for input(s): LIPASE, AMYLASE in the last 168 hours. No results for input(s): AMMONIA in the last 168 hours. CBC:  Recent Labs Lab 04/02/15 0659 04/03/15 0356 04/04/15 0420 04/05/15 0425  WBC 15.1* 8.6 8.5 9.3  NEUTROABS 12.2*  --   --   --   HGB 9.7* 10.7* 10.0* 10.0*  HCT 30.8* 35.1* 33.3* 33.5*  MCV 86.3 89.5 89.8 90.5  PLT 329 281 284 281    Recent Results (from the past 240 hour(s))  Culture, blood (routine x 2)     Status: None (Preliminary  result)   Collection Time: 04/04/15  5:02 PM  Result Value Ref Range Status   Specimen Description BLOOD RIGHT ARM  Final   Special Requests BOTTLES DRAWN AEROBIC AND ANAEROBIC 10 CC EA  Final   Culture   Final    NO GROWTH 2 DAYS Performed at [facility]    Report Status PENDING  Incomplete  Culture, blood (routine x 2)     Status: None (Preliminary result)   Collection Time: 04/04/15  5:20 PM  Result Value Ref Range Status   Specimen Description BLOOD RIGHT HAND  Final   Special Requests BOTTLES DRAWN AEROBIC AND ANAEROBIC 5 CC EA  Final   Culture   Final    NO GROWTH 2 DAYS Performed at [facility]    Report Status PENDING  Incomplete     Studies: No results found.  Scheduled Meds: . aspirin EC  81 mg Oral Daily  . ceFEPime (MAXIPIME) IV  2 g Intravenous Q8H  . cloZAPine  100 mg Oral BID  . cloZAPine  400 mg Oral QHS  . donepezil  5 mg Oral QHS  . enoxaparin (LOVENOX) injection  40 mg Subcutaneous Q24H  . feeding supplement (GLUCERNA SHAKE)  237 mL Oral BID BM  . gabapentin  300 mg Oral TID  . haloperidol  10 mg Oral QHS  . insulin aspart  0-15 Units Subcutaneous TID WC  . insulin glargine  10 Units Subcutaneous Daily  . lactulose  30 g Oral BID  . lisinopril  5 mg Oral Daily  . metoprolol tartrate  25 mg Oral BID  . metroNIDAZOLE  500 mg Oral 3 times per day  . pantoprazole  40 mg Oral Daily  . senna  1 tablet Oral BID  . simvastatin  5 mg Oral QHS  . trihexyphenidyl  5 mg Oral BID WC  . vancomycin  1,500 mg Intravenous Q12H   Continuous Infusions: . sodium chloride 75 mL/hr at 04/06/15 0210    Principal Problem:   Diabetic foot infection (HCC) Active Problems:   Essential hypertension   Diabetes mellitus with neurologic complication, without long-term current use of insulin (HCC)   Right foot pain   Neutrophilic leukocytosis   Absolute anemia    Time spent: 30 min    Vernica Wachtel  [facility] Pager [phone]. If  7PM-7AM, please contact night-coverage at [email or web address], password [facility] 04/06/2015, 5:47 PM  LOS: 4 days

## 2015-04-07 ENCOUNTER — Encounter (HOSPITAL_COMMUNITY)
Admission: EM | Disposition: A | Discharge status: Nursing Home (Includes ICF) | Payer: Self-pay | Source: Home / Self Care | Attending: Internal Medicine

## 2015-04-07 ENCOUNTER — Ambulatory Visit (HOSPITAL_COMMUNITY): Admission: RE | Admit: 2015-04-07 | Payer: Medicaid Other | Source: Ambulatory Visit | Admitting: Vascular Surgery

## 2015-04-07 HISTORY — PX: PERIPHERAL VASCULAR CATHETERIZATION: SHX172C

## 2015-04-07 LAB — CBC
HEMATOCRIT: 34.2 % — AB (ref 39.0–52.0)
HEMOGLOBIN: 10.4 g/dL — AB (ref 13.0–17.0)
MCH: 27.4 pg (ref 26.0–34.0)
MCHC: 30.4 g/dL (ref 30.0–36.0)
MCV: 90.2 fL (ref 78.0–100.0)
Platelets: 313 10*3/uL (ref 150–400)
RBC: 3.79 MIL/uL — AB (ref 4.22–5.81)
RDW: 14.5 % (ref 11.5–15.5)
WBC: 9.4 10*3/uL (ref 4.0–10.5)

## 2015-04-07 LAB — BASIC METABOLIC PANEL
ANION GAP: 8 (ref 5–15)
BUN: 15 mg/dL (ref 6–20)
CALCIUM: 9.6 mg/dL (ref 8.9–10.3)
CHLORIDE: 104 mmol/L (ref 101–111)
CO2: 30 mmol/L (ref 22–32)
Creatinine, Ser: 0.86 mg/dL (ref 0.61–1.24)
GFR calc non Af Amer: >60 mL/min (ref >60)
GLUCOSE: 160 mg/dL — AB (ref 65–99)
POTASSIUM: 4.2 mmol/L (ref 3.5–5.1)
Sodium: 142 mmol/L (ref 135–145)

## 2015-04-07 LAB — GLUCOSE, CAPILLARY
GLUCOSE-CAPILLARY: 232 mg/dL — AB (ref 65–99)
Glucose-Capillary: 132 mg/dL — ABNORMAL HIGH (ref 65–99)
Glucose-Capillary: 102 mg/dL — ABNORMAL HIGH (ref 65–99)
Glucose-Capillary: 157 mg/dL — ABNORMAL HIGH (ref 65–99)

## 2015-04-07 LAB — PROTIME-INR
INR: 1.07 (ref 0.00–1.49)
Prothrombin Time: 14.1 seconds (ref 11.6–15.2)

## 2015-04-07 LAB — POCT ACTIVATED CLOTTING TIME: Activated Clotting Time: 157 seconds

## 2015-04-07 SURGERY — ABDOMINAL AORTOGRAM
Anesthesia: LOCAL

## 2015-04-07 MED ORDER — MIDAZOLAM HCL 2 MG/2ML IJ SOLN
INTRAMUSCULAR | Status: DC | PRN
  Administered 2015-04-07: 0.5 mg via INTRAVENOUS

## 2015-04-07 MED ORDER — MIDAZOLAM HCL 2 MG/2ML IJ SOLN
INTRAMUSCULAR | Status: AC
Start: 2015-04-07 — End: 2015-04-07
  Filled 2015-04-07: qty 2

## 2015-04-07 MED ORDER — OXYCODONE-ACETAMINOPHEN 5-325 MG PO TABS
1.0000 | ORAL_TABLET | ORAL | Status: DC | PRN
  Administered 2015-04-07: 2 via ORAL
  Administered 2015-04-07: 1 via ORAL
  Administered 2015-04-08: 2 via ORAL
  Administered 2015-04-08: 1 via ORAL
  Administered 2015-04-08: 2 via ORAL
  Administered 2015-04-09: 1 via ORAL
  Administered 2015-04-09: 2 via ORAL
  Administered 2015-04-09: 1 via ORAL
  Administered 2015-04-10 – 2015-04-11 (×4): 2 via ORAL
  Administered 2015-04-11 (×2): 1 via ORAL
  Administered 2015-04-11 – 2015-04-15 (×11): 2 via ORAL
  Filled 2015-04-07: qty 1
  Filled 2015-04-07: qty 2
  Filled 2015-04-07 (×2): qty 1
  Filled 2015-04-07 (×2): qty 2
  Filled 2015-04-07 (×2): qty 1
  Filled 2015-04-07 (×14): qty 2
  Filled 2015-04-07: qty 1
  Filled 2015-04-07: qty 2

## 2015-04-07 MED ORDER — SODIUM CHLORIDE 0.9 % IV SOLN
1.0000 mL/kg/h | INTRAVENOUS | Status: AC
  Administered 2015-04-07: 1 mL/kg/h via INTRAVENOUS

## 2015-04-07 MED ORDER — MORPHINE SULFATE (PF) 2 MG/ML IV SOLN
INTRAVENOUS | Status: AC
  Filled 2015-04-07: qty 1

## 2015-04-07 MED ORDER — LIDOCAINE HCL (PF) 1 % IJ SOLN
INTRAMUSCULAR | Status: AC
  Filled 2015-04-07: qty 30

## 2015-04-07 MED ORDER — HEPARIN (PORCINE) IN NACL 2-0.9 UNIT/ML-% IJ SOLN
INTRAMUSCULAR | Status: AC
  Filled 2015-04-07: qty 1000

## 2015-04-07 MED ORDER — HEPARIN SODIUM (PORCINE) 1000 UNIT/ML IJ SOLN
INTRAMUSCULAR | Status: DC | PRN
  Administered 2015-04-07: 4000 [IU] via INTRAVENOUS

## 2015-04-07 MED ORDER — FENTANYL CITRATE (PF) 100 MCG/2ML IJ SOLN
INTRAMUSCULAR | Status: DC | PRN
  Administered 2015-04-07: 25 ug via INTRAVENOUS

## 2015-04-07 MED ORDER — IODIXANOL 320 MG/ML IV SOLN
INTRAVENOUS | Status: DC | PRN
  Administered 2015-04-07: 222 mL via INTRA_ARTERIAL

## 2015-04-07 MED ORDER — FENTANYL CITRATE (PF) 100 MCG/2ML IJ SOLN
INTRAMUSCULAR | Status: AC
  Filled 2015-04-07: qty 2

## 2015-04-07 MED ORDER — OXYCODONE-ACETAMINOPHEN 5-325 MG PO TABS
ORAL_TABLET | ORAL | Status: AC
  Filled 2015-04-07: qty 2

## 2015-04-07 MED ORDER — HEPARIN SODIUM (PORCINE) 1000 UNIT/ML IJ SOLN
INTRAMUSCULAR | Status: AC
  Filled 2015-04-07: qty 1

## 2015-04-07 MED ORDER — LIDOCAINE HCL (PF) 1 % IJ SOLN
INTRAMUSCULAR | Status: DC | PRN
  Administered 2015-04-07: 12 mL

## 2015-04-07 SURGICAL SUPPLY — 12 items
CATH ANGIO 5F PIGTAIL 65CM (CATHETERS) ×1 IMPLANT
COVER PRB 48X5XTLSCP FOLD TPE (BAG) IMPLANT
COVER PROBE 5X48 (BAG) ×2
DEVICE TORQUE .025-.038 (MISCELLANEOUS) ×1 IMPLANT
GUIDEWIRE ANGLED .035X150CM (WIRE) ×1 IMPLANT
KIT MICROINTRODUCER STIFF 5F (SHEATH) ×1 IMPLANT
KIT PV (KITS) ×2 IMPLANT
SHEATH PINNACLE 5F 10CM (SHEATH) ×4 IMPLANT
SYR MEDRAD MARK V 150ML (SYRINGE) ×2 IMPLANT
TRANSDUCER W/STOPCOCK (MISCELLANEOUS) ×2 IMPLANT
TRAY PV CATH (CUSTOM PROCEDURE TRAY) ×2 IMPLANT
WIRE BENTSON .035X145CM (WIRE) ×2 IMPLANT

## 2015-04-07 NOTE — Interval H&P Note (Signed)
History and Physical Interval Note:  04/07/2015 12:01 PM  Craig Bentley  has presented today for surgery, with the diagnosis of right foot pain  The various methods of treatment have been discussed with the patient and family. After consideration of risks, benefits and other options for treatment, the patient has consented to  Procedure(s): Abdominal Aortogram (N/A) as a surgical intervention .  The patient's history has been reviewed, patient examined, no change in status, stable for surgery.  I have reviewed the patient's chart and labs.  Questions were answered to the patient's satisfaction.     Deitra Mayo

## 2015-04-07 NOTE — Progress Notes (Signed)
Eating [place] sandwich. Medicated for rt foot pain; rt foot elevated on pillow

## 2015-04-07 NOTE — Progress Notes (Signed)
Site area: left groin fv sheath Site Prior to Removal:  Level  0 Pressure Applied For:  15 minutes Manual:   yes Patient Status During Pull:  stable Post Pull Site:  Level  0 Post Pull Instructions Given:  yes Post Pull Pulses Present: yes Dressing Applied:  Small tegaderm Bedrest begins @  Comments:

## 2015-04-07 NOTE — H&P (View-Only) (Signed)
Referred by:  Dr. Allyson Sabal  Reason for referral: right toe gangrene  History of Present Illness  Craig Bentley is a 60 y.o. (12-12-1955) male with schizophrenia and diabetes who presents with chief complaint: right foot pain.  Onset of symptom occurred in November 2017.  Pain is described as aching, severity 1-5/10, and associated with movement, rest and manipulating the right foot.  Patient has attempted to treat this pain with rest.  The patient has no rest pain symptoms.  Wounds include: right 5th toe blackness and painful area in R 4th toe.  Atherosclerotic risk factors include: HTN, DM, active smoker.  Past Medical History  Diagnosis Date  . Hypertension   . Diabetes mellitus   . Schizophrenia, schizo-affective ([place])   . COPD (chronic obstructive pulmonary disease) ([place])   . Constipation   . Fecal impaction [facility])     Surgical History: unknown, patient denies   Social History   Social History  . Marital Status: Single    Spouse Name: N/A  . Number of Children: N/A  . Years of Education: N/A   Occupational History  . Not on file.   Social History Main Topics  . Smoking status: Current Every Day Smoker -- 1.50 packs/day    Types: Cigarettes  . Smokeless tobacco: Not on file  . Alcohol Use: No  . Drug Use: No  . Sexual Activity: No   Other Topics Concern  . Not on file   Social History Narrative    Family History  Problem Relation Age of Onset  . Mental illness Mother     Current Facility-Administered Medications  Medication Dose Route Frequency Provider Last Rate Last Dose  . 0.9 %  sodium chloride infusion   Intravenous Continuous Reyne Dumas, MD 75 mL/hr at 04/03/15 1155    . acetaminophen (TYLENOL) tablet 650 mg  650 mg Oral Q6H PRN Bonnielee Haff, MD   650 mg at 04/02/15 1109   Or  . acetaminophen (TYLENOL) suppository 650 mg  650 mg Rectal Q6H PRN Bonnielee Haff, MD      . ceFEPIme (MAXIPIME) 2 g in dextrose 5 % 50 mL IVPB  2 g Intravenous Q8H Angela Adam, RPH   2 g at 04/03/15 1223  . cloZAPine (CLOZARIL) tablet 100 mg  100 mg Oral BID Bonnielee Haff, MD   100 mg at 04/03/15 1619  . cloZAPine (CLOZARIL) tablet 400 mg  400 mg Oral QHS Bonnielee Haff, MD   400 mg at 04/02/15 2140  . dicyclomine (BENTYL) tablet 20 mg  20 mg Oral BID PRN Bonnielee Haff, MD      . donepezil (ARICEPT) tablet 5 mg  5 mg Oral QHS Bonnielee Haff, MD   5 mg at 04/02/15 2140  . enoxaparin (LOVENOX) injection 40 mg  40 mg Subcutaneous Q24H Bonnielee Haff, MD   40 mg at 04/02/15 1837  . feeding supplement (GLUCERNA SHAKE) (GLUCERNA SHAKE) liquid 237 mL  237 mL Oral BID BM Bonnielee Haff, MD   237 mL at 04/03/15 1618  . gabapentin (NEURONTIN) capsule 300 mg  300 mg Oral TID Bonnielee Haff, MD   300 mg at 04/03/15 1619  . haloperidol (HALDOL) tablet 10 mg  10 mg Oral QHS Bonnielee Haff, MD   10 mg at 04/02/15 2141  . insulin aspart (novoLOG) injection 0-15 Units  0-15 Units Subcutaneous TID WC Bonnielee Haff, MD   3 Units at 04/03/15 0809  . ipratropium-albuterol (DUONEB) 0.5-2.5 (3) MG/3ML nebulizer solution 3 mL  3 mL Nebulization Q6H PRN Bonnielee Haff, MD      . lactulose (CHRONULAC) 10 GM/15ML solution 30 g  30 g Oral BID Bonnielee Haff, MD   30 g at 04/03/15 0941  . lisinopril (PRINIVIL,ZESTRIL) tablet 20 mg  20 mg Oral Daily Bonnielee Haff, MD   20 mg at 04/03/15 0941  . metoprolol tartrate (LOPRESSOR) tablet 25 mg  25 mg Oral BID Bonnielee Haff, MD   25 mg at 04/03/15 0941  . metroNIDAZOLE (FLAGYL) tablet 500 mg  500 mg Oral 3 times per day Bonnielee Haff, MD   500 mg at 04/03/15 1430  . morphine 2 MG/ML injection 2 mg  2 mg Intravenous Q3H PRN Bonnielee Haff, MD   2 mg at 04/02/15 1838  . oxyCODONE (Oxy IR/ROXICODONE) immediate release tablet 5 mg  5 mg Oral Q4H PRN Bonnielee Haff, MD   5 mg at 04/03/15 1619  . pantoprazole (PROTONIX) EC tablet 40 mg  40 mg Oral Daily Bonnielee Haff, MD   40 mg at 04/03/15 0941  . polyethylene glycol (MIRALAX / GLYCOLAX) packet  17 g  17 g Oral Daily PRN Bonnielee Haff, MD      . senna (SENOKOT) tablet 8.6 mg  1 tablet Oral BID Bonnielee Haff, MD   8.6 mg at 04/03/15 0941  . simvastatin (ZOCOR) tablet 5 mg  5 mg Oral QHS Bonnielee Haff, MD   5 mg at 04/02/15 2141  . trihexyphenidyl (ARTANE) tablet 5 mg  5 mg Oral BID WC Bonnielee Haff, MD   5 mg at 04/03/15 1619  . vancomycin (VANCOCIN) 1,500 mg in sodium chloride 0.9 % 500 mL IVPB  1,500 mg Intravenous Q12H Emiliano Dyer, RPH   1,500 mg at 04/03/15 0602    No Known Allergies   REVIEW OF SYSTEMS:  (Positives checked otherwise negative)  CARDIOVASCULAR:   '[ ]'$  chest pain,  '[ ]'$  chest pressure,  '[ ]'$  palpitations,  '[ ]'$  shortness of breath when laying flat,  '[ ]'$  shortness of breath with exertion,   '[ ]'$  pain in feet when walking,  '[ ]'$  pain in feet when laying flat, '[ ]'$  history of blood clot in veins (DVT),  '[ ]'$  history of phlebitis,  '[ ]'$  swelling in legs,  '[ ]'$  varicose veins  PULMONARY:   '[ ]'$  productive cough,  '[ ]'$  asthma,  '[ ]'$  wheezing  NEUROLOGIC:   '[ ]'$  weakness in arms or legs,  '[ ]'$  numbness in arms or legs,  '[ ]'$  difficulty speaking or slurred speech,  '[ ]'$  temporary loss of vision in one eye,  '[ ]'$  dizziness  HEMATOLOGIC:   '[ ]'$  bleeding problems,  '[ ]'$  problems with blood clotting too easily  MUSCULOSKEL:   '[ ]'$  joint pain, '[ ]'$  joint swelling  GASTROINTEST:   '[ ]'$  vomiting blood,  '[ ]'$  blood in stool     GENITOURINARY:   '[ ]'$  burning with urination,  '[ ]'$  blood in urine  PSYCHIATRIC:   '[ ]'$  history of major depression '[x]'$  schizophrenia  INTEGUMENTARY:   '[ ]'$  rashes,  '[x]'$  ulcers  CONSTITUTIONAL:   '[ ]'$  fever,  '[ ]'$  chills   For VQI Use Only  PRE-ADM LIVING: Nursing home  AMB STATUS: Ambulatory  CAD Sx: None  PRIOR CHF: None  STRESS TEST: '[x]'$  No, '[ ]'$  Normal, '[ ]'$  + ischemia, '[ ]'$  + MI, '[ ]'$  Both   Physical Examination  Filed Vitals:   04/02/15 2224 04/03/15 0443  04/03/15 0941 04/03/15 1413  BP: 120/49 158/70 158/70 152/76    Pulse: 78 90  88  Temp: 98.5 F (36.9 C) 98.8 F (37.1 C)  98.8 F (37.1 C)  TempSrc: Oral Oral  Oral  Resp: '18 18  18  '$ Height:      Weight:      SpO2: 94% 92%  95%   Body mass index is 25.4 kg/(m^2).  General: A&O x 3, WDWN  Head: [place]/AT  Ear/Nose/Throat: Hearing grossly intact, nares w/o erythema or drainage, oropharynx w/o Erythema/Exudate, Mallampati score: 3  Eyes: PERRLA, EOMI  Neck: Supple, no nuchal rigidity, no palpable LAD  Pulmonary: Sym exp, good air movt, CTAB, no rales, rhonchi, & wheezing  Cardiac: RRR, Nl S1, S2, no Murmurs, rubs or gallops  Vascular: Vessel Right Left  Radial Palpable Palpable  Brachial Palpable Palpable  Carotid Palpable, without bruit Palpable, without bruit  Aorta Not palpable N/A  Femoral Faintly Palpable Not Palpable  Popliteal Not palpable Not palpable  PT Not Palpable Not Palpable  DP Not Palpable Not Palpable   Gastrointestinal: soft, NTND, -G/R, - HSM, - masses, - CVAT B  Musculoskeletal: M/S 5/5 throughout , Extremities without ischemic changes except  R foot: 5th distal phalange with likely dry gangrene, erosion from tip of 5th distal phalange in lateral side of 4th phalange  Neurologic: CN grossly intact , Pain and light touch intact in extremities , Motor exam as listed above  Psychiatric: Judgment intact, Mood & affect appropriate for pt's clinical situation  Dermatologic: See M/S exam for extremity exam, no rashes otherwise noted  Lymph : No Cervical, Axillary, or Inguinal lymphadenopathy    Non-Invasive Vascular Imaging  ABI (Date: 04/02/15)    RIGHT    LEFT    PRESSURE WAVEFORM  PRESSURE WAVEFORM  BRACHIAL 161 Triphasic BRACHIAL 144 Triphasic  DP 43 Dampened Monophasic DP 73 Monophasic  PT 43 Dampened Monophasic PT 66 Monophasic    RIGHT LEFT  ABI 0.27 0.43   ABIs indicate a severe reduction in arterial flow with abnormal Doppler waveforms bilaterally at rest      Laboratory: CBC:    Component Value Date/Time   WBC 8.6 04/03/2015 0356   RBC 3.92* 04/03/2015 0356   RBC 3.92* 04/03/2015 0356   HGB 10.7* 04/03/2015 0356   HCT 35.1* 04/03/2015 0356   PLT 281 04/03/2015 0356   MCV 89.5 04/03/2015 0356   MCH 27.3 04/03/2015 0356   MCHC 30.5 04/03/2015 0356   RDW 14.1 04/03/2015 0356   LYMPHSABS 2.1 04/02/2015 0659   MONOABS 0.8 04/02/2015 0659   EOSABS 0.0 04/02/2015 0659   BASOSABS 0.0 04/02/2015 0659    BMP:    Component Value Date/Time   NA 142 04/03/2015 0356   K 4.4 04/03/2015 0356   CL 106 04/03/2015 0356   CO2 28 04/03/2015 0356   GLUCOSE 145* 04/03/2015 0356   BUN 16 04/03/2015 0356   CREATININE 0.68 04/03/2015 0356   CALCIUM 8.9 04/03/2015 0356   GFRNONAA >60 04/03/2015 0356   GFRAA >60 04/03/2015 0356    Coagulation: Lab Results  Component Value Date   INR 0.96 07/20/2011   No results found for: PTT  Lipids: No results found for: CHOL, TRIG, HDL, CHOLHDL, VLDL, LDLCALC, LDLDIRECT   Radiology: Mr Foot Right W Wo Contrast  04/02/2015  CLINICAL DATA:  Diabetic foot infection.  Fifth digit is black. EXAM: MRI OF THE RIGHT FOREFOOT WITHOUT AND WITH CONTRAST TECHNIQUE: Multiplanar, multisequence MR imaging was performed both  before and after administration of intravenous contrast. CONTRAST:  53m MULTIHANCE GADOBENATE DIMEGLUMINE 529 MG/ML IV SOLN COMPARISON:  None. FINDINGS: Minimal marrow edema in the first distal phalanx without cortical destruction. No other marrow signal abnormality. No acute fracture dislocation. No bone destruction or periosteal reaction. Mild osteoarthritis of the first MTP joint. There is no soft tissue fluid collection or hematoma. There is mild generalized soft tissue edema along the dorsal aspect of the midfoot with mild enhancement which may reflect reactive edema versus mild cellulitis. The muscles are normal. The visualized flexor and extensor tendons are intact. IMPRESSION: 1. No evidence of  osteomyelitis of the right foot. Electronically Signed   By: HKathreen Devoid  On: 04/02/2015 18:44   Dg Foot Complete Right  04/02/2015  CLINICAL DATA:  Right pinky toe is black, with dorsal soft tissue swelling and erythema along the foot. Initial encounter. EXAM: RIGHT FOOT COMPLETE - 3+ VIEW COMPARISON:  None. FINDINGS: There is no evidence of fracture or dislocation. No definite osseous erosion is characterized, though evaluation for osteomyelitis is limited on radiograph. The joint spaces are preserved. There is no evidence of talar subluxation; the subtalar joint is unremarkable in appearance. An os peroneum is seen. Mild dorsal soft tissue swelling is noted. IMPRESSION: 1. No evidence of fracture or dislocation. No definite osseous erosion seen, though evaluation for osteomyelitis is limited on radiograph. 2. Os peroneum seen. Electronically Signed   By: JGarald BaldingM.D.   On: 04/02/2015 06:47    Medical Decision Making  JNayef Collegeis a 60y.o. 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  Given the limb threatening status of this patient, I recommend an aggressive work up including proceeding with an: Aortogram, Bilateral runoff.  I can't really feel a strong femoral pulse, raising the possibility that this patient has significant iliac disease, will obtain BLE arterial duplex to get a better idea if arm access might be necessary for the aortogram. As I can't feel a left femoral pulse, I doubt he is a candidate for immediate intervention. Will get diagnostic studies first and consider endovascular intervention at a different sitting as an antegrade cannulation in a patient with Schizophrenia likely requires general anesthesia.   I discussed with  the patient the nature of angiographic procedures, especially the limited patencies of any endovascular intervention. The patient is aware of that the risks of an angiographic procedure include but are not limited to: bleeding, infection, access site complications, embolization, rupture of treated vessel, dissection, possible need for emergent surgical intervention, and possible need for surgical procedures to treat the patient's pathology. The patient is aware of the risks and agrees to proceed.  The procedure is scheduled for: Monday 13 FEB 17 as there are  no slots on the schedule Friday. Today this patient was very cooperative with obvious intact mental status.  If this changes, he might need to be rescheduled into the OR setting. I discussed in depth with the patient the nature of atherosclerosis, and emphasized the importance of maximal medical management including strict control of blood pressure, blood glucose, and lipid levels, antiplatelet agents, obtaining regular exercise, and cessation of smoking.  The patient is aware that without maximal medical management the underlying atherosclerotic disease  process will progress, limiting the benefit of any interventions. The patient is currently on a statin:  Zocor. The patient is currently not on an anti-platelet. The patient should be started on ASA 81 mg PO daily. Thank you for allowing [place] to participate in this patient's care. I will be on leave this coming Mon and Tuesday.  Dx angiogram will be done on Monday by Dr. Scot Dock.  I will check in on the patient on Wednesday. Continue wound care as directed by WOC.   Adele Barthel, MD [facility] Office: [phone] Pager: [phone]  04/03/2015, 5:46 PM

## 2015-04-07 NOTE — Progress Notes (Signed)
CSW following to assist with d/c planning. Pt requested assistance contacting his sister, Kenney Houseman.  [place] contacted and Tonya's # 's provided. ALF reports that they have been trying to contact Tonya since pt was transferred to [facility] with no luck. Tonya's cell # is no longer working [phone] ). Tonya's home #  [phone] ) has been tried multiple times without being able to connect / or leave a message. CSW will continue to try to reach [facility] as pt has requested.  Werner Lean LCSW [phone]

## 2015-04-07 NOTE — Progress Notes (Signed)
[facility] PROGRESS NOTE  Craig Bentley NFA:[id number] DOB: 01/13/1956 DOA: 04/02/2015 PCP: Vinnie Langton, NP  Brief Summary  The patient is a 60 year old male with history of schizophrenia, IVs mellitus, hypertension who was in a group home and presented to the emergency department because of pain in his right foot, particularly in the fifth toe area which has been turning dark.  In the emergency department, he was found have an infected fifth toe and was admitted for IV antibiotics.  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  -  Continue IV antibiotics -  Arterial Doppler shows ABI of 0.27 on the right, 0.43 on the left. -  Appreciate vascular surgery assistance, possibly amenable to intervention.  Case will be discussed on Wed -  Continue aspirin   Diabetes mellitus type 2, hemoglobin A1c 7 -  CBG range from 131-223, typically higher before dinner -  Continue Lantus 10 units daily -  continue aspart moderate dose sliding scale -  Continue 3 units before lunch  Schizophrenia, stable, continue home medications  Anemia of chronic disease. Iron level, vitamin B12, folate within normal limits.  Hypertension, stable, continue lisinopril and metoprolol today but agree with holding lisinopril on Monday morning   Diet:   Diabetic Access:  PIV IVF:  off Proph:  lovenox  Code Status: full Family Communication: patient alone Disposition Plan:  Pending definitive management of fifth toe.     Consultants:  Drs. Imogene Burn and [place], vascular surgery  Procedures:  ABI  Antibiotics:  Vanc, cefepime, flagyl   HPI/Subjective: Pain medication still helping.  Denies fevers, chills, nausea, vomiting, SOB, CP.  Asked that I make sure his family was notified.    ective: Filed Vitals:   04/07/15 1425 04/07/15 1440 04/07/15 1450 04/07/15 1528  BP: 183/78 185/75 181/82 147/58  Pulse: 81 92 90 82  Temp:      TempSrc:      Resp: 18 21 17 17   Height:       Weight:      SpO2: 94% 95% 96% 98%    Intake/Output Summary (Last 24 hours) at 04/07/15 1831 Last data filed at 04/07/15 1825  Gross per 24 hour  Intake   2100 ml  Output   2150 ml  Net    -50 ml   Filed Weights   04/02/15 0846  Weight: 82.555 kg (182 lb)   Body mass index is 25.4 kg/(m^2).  Exam:   General:   adult male, No acute distress, seen post-procedure today  HEENT:  NCAT, MMM  Cardiovascular:  RRR, nl S1, S2 no mrg  Respiratory:  CTAB, no increased WOB  Abdomen:   NABS, soft, NT/ND  MSK:   Normal tone and bulk,  foot to just above the ankle on the right side appears somewhat dusky. His fifth toe on that foot is black and the distal portion of it has receded.  Data Reviewed: Basic Metabolic Panel:  Recent Labs Lab 04/02/15 0659 04/03/15 0356 04/04/15 0420 04/05/15 0425 04/07/15 0507  NA 138 142 141 142 142  K 4.4 4.4 3.9 4.1 4.2  CL 99* 106 105 107 104  CO2 28 28 27 28 30   GLUCOSE 146* 145* 204* 184* 160*  BUN 15 16 14 13 15   CREATININE 0.82 0.68 0.73 0.72 0.86  CALCIUM 9.4 8.9 8.5* 9.1 9.6   Liver Function Tests:  Recent Labs Lab 04/03/15 0356 04/04/15 0420 04/05/15 0425  AST 14* 17 16  ALT 15* 15*  17  ALKPHOS 62 65 53  BILITOT 0.3 0.5 0.8  PROT 6.8 6.3* 6.8  ALBUMIN 3.3* 3.1* 3.2*   No results for input(s): LIPASE, AMYLASE in the last 168 hours. No results for input(s): AMMONIA in the last 168 hours. CBC:  Recent Labs Lab 04/02/15 0659 04/03/15 0356 04/04/15 0420 04/05/15 0425 04/07/15 0507  WBC 15.1* 8.6 8.5 9.3 9.4  NEUTROABS 12.2*  --   --   --   --   HGB 9.7* 10.7* 10.0* 10.0* 10.4*  HCT 30.8* 35.1* 33.3* 33.5* 34.2*  MCV 86.3 89.5 89.8 90.5 90.2  PLT 329 281 284 281 313    Recent Results (from the past 240 hour(s))  Culture, blood (routine x 2)     Status: None (Preliminary result)   Collection Time: 04/04/15  5:02 PM  Result Value Ref Range Status   Specimen Description BLOOD RIGHT ARM  Final   Special Requests  BOTTLES DRAWN AEROBIC AND ANAEROBIC 10 CC EA  Final   Culture   Final    NO GROWTH 3 DAYS Performed at [facility]    Report Status PENDING  Incomplete  Culture, blood (routine x 2)     Status: None (Preliminary result)   Collection Time: 04/04/15  5:20 PM  Result Value Ref Range Status   Specimen Description BLOOD RIGHT HAND  Final   Special Requests BOTTLES DRAWN AEROBIC AND ANAEROBIC 5 CC EA  Final   Culture   Final    NO GROWTH 3 DAYS Performed at [facility]    Report Status PENDING  Incomplete     Studies: No results found.  Scheduled Meds: . aspirin EC  81 mg Oral Daily  . ceFEPime (MAXIPIME) IV  2 g Intravenous Q8H  . cloZAPine  100 mg Oral BID  . cloZAPine  400 mg Oral QHS  . donepezil  5 mg Oral QHS  . enoxaparin (LOVENOX) injection  40 mg Subcutaneous Q24H  . feeding supplement (GLUCERNA SHAKE)  237 mL Oral BID BM  . gabapentin  300 mg Oral TID  . haloperidol  10 mg Oral QHS  . insulin aspart  0-15 Units Subcutaneous TID WC  . insulin aspart  3 Units Subcutaneous QAC lunch  . insulin glargine  10 Units Subcutaneous Daily  . lactulose  30 g Oral BID  . metoprolol tartrate  25 mg Oral BID  . metroNIDAZOLE  500 mg Oral 3 times per day  . pantoprazole  40 mg Oral Daily  . senna  1 tablet Oral BID  . simvastatin  5 mg Oral QHS  . trihexyphenidyl  5 mg Oral BID WC  . vancomycin  1,500 mg Intravenous Q12H   Continuous Infusions: . sodium chloride 75 mL/hr (04/06/15 2132)    Principal Problem:   Diabetic foot infection (HCC) Active Problems:   Essential hypertension   Diabetes mellitus with neurologic complication, without long-term current use of insulin (HCC)   Right foot pain   Neutrophilic leukocytosis   Absolute anemia    Time spent: 30 min    Kynadi Dragos  [facility] Pager [phone]. If 7PM-7AM, please contact night-coverage at [email or web address], password [facility] 04/07/2015, 6:31 PM  LOS: 5 days

## 2015-04-07 NOTE — Progress Notes (Signed)
When arrived to cath lab the procedure was in progress and they did not need an IV start.

## 2015-04-07 NOTE — Op Note (Signed)
   PATIENT: Craig Bentley   MRN: [id number] DOB: October 26, 1955    DATE OF PROCEDURE: 04/07/2015  INDICATIONS: Craig Bentley is a 59 y.o. male with a nonhealing wound of the right foot. I was asked to perform arteriography.  PROCEDURE:  1. Ultrasound-guided access of the right common femoral artery 2. Aortogram with bilateral iliac arteriogram and bilateral lower extremity runoff 3. Retrograde right femoral arteriogram with right lower extremity runoff  SURGEON: Judeth Cornfield. Scot Dock, MD, FACS  ANESTHESIA: local with sedation   EBL: minimal  TECHNIQUE: The patient was taken to the peripheral vascular lab . At 1232 he received 0.5 g of Versed and 25 g of fentanyl. I observed him during the entire case. The nurses monitored his blood pressure, heart rate, and oxygen saturation during sedation. The case ended at 1:06 PM. Both groins were prepped and draped in usual sterile fashion. Under ultrasound guidance, after the skin was anesthetized, the right common femoral artery was cannulated with a micropuncture needle and a micropuncture sheath introduced over the wire. This was exchanged for a 5 French sheath over an angled Glidewire. The pigtail catheter was positioned at the L1 vertebral body and flush aortogram obtained. The catheter was in position above the aortic bifurcation and an oblique iliac projection was obtained. Bilateral lower extremity runoff films were then obtained. Next the pigtail catheter was removed over a wire and a retrograde right femoral arteriogram was obtained with spot films of the right leg. At the completion of the procedure, the patient was transferred to the holding area for removal of the sheath.  FINDINGS:  1.There are 2 renal arteries on the left and one renal artery and the right. The infrarenal aorta is widely patent. The common iliac arteries and hypogastric arteries are patent. He has bilateral external iliac artery occlusions. 2. On the right side, which is the site of  concern, the distal common femoral artery and deep femoral artery are patent. The superficial femoral artery is occluded at its origin. There is reconstitution of the above-knee popliteal artery but the knee popliteal artery occludes at the knee. There is then reconstitution of the anterior tibial and posterior tibial arteries. The below-knee popliteal artery is occluded and the peroneal artery is occluded. 3. On the left side there is reconstitution of the proximal superficial femoral artery and deep femoral artery. The external iliac artery and common femoral artery are occluded. The superficial femoral artery, popliteal, anterior tibial, and posterior tibial arteries are patent. The peroneal artery and the left is occluded.  CLINICAL NOTE: This patient could be considered for revascularization and I will discuss these results with Dr. Bridgett Larsson who saw the patient in consultation. He will be back on Wednesday.  Deitra Mayo, MD, FACS [facility]  DATE OF DICTATION:   04/07/2015

## 2015-04-07 NOTE — Progress Notes (Signed)
Site area: rt groin Site Prior to Removal:  Level  0 Pressure Applied For:  20 minutes Manual:   yes Patient Status During Pull:  stable Post Pull Site:  Level  0 Post Pull Instructions Given:  yes Post Pull Pulses Present: yes Dressing Applied:  tegaderm Bedrest begins @  1410 Comments:

## 2015-04-08 ENCOUNTER — Encounter (HOSPITAL_COMMUNITY): Payer: Self-pay | Admitting: Vascular Surgery

## 2015-04-08 LAB — CBC
HCT: 35.4 % — ABNORMAL LOW (ref 39.0–52.0)
HEMOGLOBIN: 10.8 g/dL — AB (ref 13.0–17.0)
MCH: 27.6 pg (ref 26.0–34.0)
MCHC: 30.5 g/dL (ref 30.0–36.0)
MCV: 90.3 fL (ref 78.0–100.0)
Platelets: 306 10*3/uL (ref 150–400)
RBC: 3.92 MIL/uL — ABNORMAL LOW (ref 4.22–5.81)
RDW: 14.8 % (ref 11.5–15.5)
WBC: 10.3 10*3/uL (ref 4.0–10.5)

## 2015-04-08 LAB — GLUCOSE, CAPILLARY
GLUCOSE-CAPILLARY: 195 mg/dL — AB (ref 65–99)
GLUCOSE-CAPILLARY: 182 mg/dL — AB (ref 65–99)
GLUCOSE-CAPILLARY: 147 mg/dL — AB (ref 65–99)
Glucose-Capillary: 86 mg/dL (ref 65–99)
Glucose-Capillary: 141 mg/dL — ABNORMAL HIGH (ref 65–99)

## 2015-04-08 LAB — BASIC METABOLIC PANEL
ANION GAP: 9 (ref 5–15)
BUN: 14 mg/dL (ref 6–20)
CALCIUM: 9.2 mg/dL (ref 8.9–10.3)
CHLORIDE: 102 mmol/L (ref 101–111)
CO2: 28 mmol/L (ref 22–32)
CREATININE: 0.64 mg/dL (ref 0.61–1.24)
GFR calc Af Amer: >60 mL/min (ref >60)
GFR calc non Af Amer: >60 mL/min (ref >60)
GLUCOSE: 148 mg/dL — AB (ref 65–99)
Potassium: 4.4 mmol/L (ref 3.5–5.1)
Sodium: 139 mmol/L (ref 135–145)

## 2015-04-08 LAB — PROTIME-INR
INR: 1.09 (ref 0.00–1.49)
PROTHROMBIN TIME: 14.3 s (ref 11.6–15.2)

## 2015-04-08 MED FILL — Heparin Sodium (Porcine) 2 Unit/ML in Sodium Chloride 0.9%: INTRAMUSCULAR | Qty: 1000 | Status: AC

## 2015-04-08 NOTE — Progress Notes (Signed)
[facility] PROGRESS NOTE  Craig Bentley YQM:[id number] DOB: February 08, 1956 DOA: 04/02/2015 PCP: Vinnie Langton, NP  Brief Summary  The patient is a 60 year old male with history of schizophrenia, IVs mellitus, hypertension who was in a group home and presented to the emergency department because of pain in his right foot, particularly in the fifth toe area which has been turning dark.  In the emergency department, he was found have an infected fifth toe and was admitted for IV antibiotics.  Underwent angiography that demonstrates there may be a role for vascular intervention to salvage his toe/foot.    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  -  Continue IV antibiotics -  Arterial Doppler shows ABI of 0.27 on the right, 0.43 on the left. -  Appreciate vascular surgery assistance, possibly amenable to intervention.  Case will be discussed on Wed -  Continue aspirin   Diabetes mellitus type 2, hemoglobin A1c 7 -  CBG better controlled -  Continue Lantus 10 units daily -  continue aspart moderate dose sliding scale -  Continue 3 units before lunch  Schizophrenia, stable, continue home medications  Anemia of chronic disease. Iron level, vitamin B12, folate within normal limits.  Hypertension, blood pressures lower today -  continue metoprolol -  Hold lisinopril again today following BP trend  Diet:   Diabetic Access:  PIV IVF:  off Proph:  lovenox  Code Status: full Family Communication: patient alone Disposition Plan:  Pending definitive management of fifth toe.     Consultants:  Drs. Imogene Burn and Edilia Bo, vascular surgery  Procedures:  ABI  Angiogram of the right leg   Antibiotics:  Vanc, cefepime, flagyl   HPI/Subjective: Pain medication still helping.  Denies fevers, chills, nausea, vomiting, SOB, CP.    ective: Filed Vitals:   04/07/15 1528 04/07/15 2121 04/08/15 0527 04/08/15 1439  BP: 147/58 134/66 147/65 127/56  Pulse: 82 97 72 74   Temp:  98.3 F (36.8 C) 99.5 F (37.5 C) 98 F (36.7 C)  TempSrc:  Oral Oral Oral  Resp: 17 18 18 18   Height:      Weight:      SpO2: 98% 96% 98% 96%    Intake/Output Summary (Last 24 hours) at 04/08/15 1552 Last data filed at 04/08/15 1439  Gross per 24 hour  Intake 3417.5 ml  Output   1450 ml  Net 1967.5 ml   Filed Weights   04/02/15 0846  Weight: 82.555 kg (182 lb)   Body mass index is 25.4 kg/(m^2).  Exam:   General:   adult male, No acute distress, sleeping but easily arouseable  HEENT:  NCAT, MMM  Cardiovascular:  RRR, nl S1, S2 no mrg  Respiratory:  CTAB, no increased WOB  Abdomen:   NABS, soft, NT/ND  MSK:   Normal tone and bulk,  foot to just above the ankle on the right side appears somewhat dusky. His fifth toe on that foot is black and the distal portion of it has receded.  Data Reviewed: Basic Metabolic Panel:  Recent Labs Lab 04/03/15 0356 04/04/15 0420 04/05/15 0425 04/07/15 0507 04/08/15 0458  NA 142 141 142 142 139  K 4.4 3.9 4.1 4.2 4.4  CL 106 105 107 104 102  CO2 28 27 28 30 28   GLUCOSE 145* 204* 184* 160* 148*  BUN 16 14 13 15 14   CREATININE 0.68 0.73 0.72 0.86 0.64  CALCIUM 8.9 8.5* 9.1 9.6 9.2   Liver Function Tests:  Recent Labs Lab 04/03/15 0356 04/04/15 0420 04/05/15 0425  AST 14* 17 16  ALT 15* 15* 17  ALKPHOS 62 65 53  BILITOT 0.3 0.5 0.8  PROT 6.8 6.3* 6.8  ALBUMIN 3.3* 3.1* 3.2*   No results for input(s): LIPASE, AMYLASE in the last 168 hours. No results for input(s): AMMONIA in the last 168 hours. CBC:  Recent Labs Lab 04/02/15 0659 04/03/15 0356 04/04/15 0420 04/05/15 0425 04/07/15 0507 04/08/15 0458  WBC 15.1* 8.6 8.5 9.3 9.4 10.3  NEUTROABS 12.2*  --   --   --   --   --   HGB 9.7* 10.7* 10.0* 10.0* 10.4* 10.8*  HCT 30.8* 35.1* 33.3* 33.5* 34.2* 35.4*  MCV 86.3 89.5 89.8 90.5 90.2 90.3  PLT 329 281 284 281 313 306    Recent Results (from the past 240 hour(s))  Culture, blood (routine x 2)      Status: None (Preliminary result)   Collection Time: 04/04/15  5:02 PM  Result Value Ref Range Status   Specimen Description BLOOD RIGHT ARM  Final   Special Requests BOTTLES DRAWN AEROBIC AND ANAEROBIC 10 CC EA  Final   Culture   Final    NO GROWTH 3 DAYS Performed at [facility]    Report Status PENDING  Incomplete  Culture, blood (routine x 2)     Status: None (Preliminary result)   Collection Time: 04/04/15  5:20 PM  Result Value Ref Range Status   Specimen Description BLOOD RIGHT HAND  Final   Special Requests BOTTLES DRAWN AEROBIC AND ANAEROBIC 5 CC EA  Final   Culture   Final    NO GROWTH 3 DAYS Performed at [facility]    Report Status PENDING  Incomplete     Studies: No results found.  Scheduled Meds: . aspirin EC  81 mg Oral Daily  . ceFEPime (MAXIPIME) IV  2 g Intravenous Q8H  . cloZAPine  100 mg Oral BID  . cloZAPine  400 mg Oral QHS  . donepezil  5 mg Oral QHS  . enoxaparin (LOVENOX) injection  40 mg Subcutaneous Q24H  . feeding supplement (GLUCERNA SHAKE)  237 mL Oral BID BM  . gabapentin  300 mg Oral TID  . haloperidol  10 mg Oral QHS  . insulin aspart  0-15 Units Subcutaneous TID WC  . insulin aspart  3 Units Subcutaneous QAC lunch  . insulin glargine  10 Units Subcutaneous Daily  . lactulose  30 g Oral BID  . metoprolol tartrate  25 mg Oral BID  . metroNIDAZOLE  500 mg Oral 3 times per day  . pantoprazole  40 mg Oral Daily  . senna  1 tablet Oral BID  . simvastatin  5 mg Oral QHS  . trihexyphenidyl  5 mg Oral BID WC  . vancomycin  1,500 mg Intravenous Q12H   Continuous Infusions: . sodium chloride 75 mL/hr at 04/08/15 1300    Principal Problem:   Diabetic foot infection (HCC) Active Problems:   Essential hypertension   Diabetes mellitus with neurologic complication, without long-term current use of insulin (HCC)   Right foot pain   Neutrophilic leukocytosis   Absolute anemia    Time spent: 30 min    Christino Mcglinchey,  Kaeson Kleinert  [facility] Pager [phone]. If 7PM-7AM, please contact night-coverage at [email or web address], password [facility] 04/08/2015, 3:52 PM  LOS: 6 days

## 2015-04-08 NOTE — Progress Notes (Signed)
Pharmacy Antibiotic Note  Craig Bentley is a 60 y.o. male admitted on 04/02/2015 with diabetic foot infection with high risk for MRSA.  Pharmacy has been consulted for vancomycin, cefepime and metronidazole dosing.  Plan: D7 abx  Currently on  Vanc '1500mg'$  IV q12h/ Cefepime 2gm IV q8h/ Metronidazole '500mg'$  po q8h  Monitor renal function  Candidate for re-vascularization, discussion Wednesday for treatment plan  Height: '5\' 11"'$  (180.3 cm) Weight: 182 lb (82.555 kg) IBW/kg (Calculated) : 75.3  Temp (24hrs), Avg:98.9 F (37.2 C), Min:98.3 F (36.8 C), Max:99.5 F (37.5 C)   Recent Labs Lab 04/03/15 0356 04/04/15 0420 04/04/15 1734 04/05/15 0425 04/07/15 0507 04/08/15 0458  WBC 8.6 8.5  --  9.3 9.4 10.3  CREATININE 0.68 0.73  --  0.72 0.86 0.64  VANCOTROUGH  --   --  13  --   --   --     Estimated Creatinine Clearance: 104.6 mL/min (by C-G formula based on Cr of 0.64).    No Known Allergies  Antimicrobials this admission: 2/8 >> vancomycin >> 2/8 >> zosyn x 1 >> 2/8 >> cefepime >> 2/8 >> flagyl >>  Dose adjustments this admission: 2/10 @ 1700 = 13 mcg/ml  Microbiology results: 2/10 blood cx: ngtd  Thank you for allowing pharmacy to be a part of this patient's care.  Minda Ditto PharmD Pager [phone] 04/08/2015, 2:01 PM

## 2015-04-09 ENCOUNTER — Inpatient Hospital Stay (HOSPITAL_COMMUNITY): Payer: Medicaid Other

## 2015-04-09 ENCOUNTER — Encounter: Payer: Self-pay | Admitting: Vascular Surgery

## 2015-04-09 DIAGNOSIS — I70269 Atherosclerosis of native arteries of extremities with gangrene, unspecified extremity: Secondary | ICD-10-CM

## 2015-04-09 DIAGNOSIS — D538 Other specified nutritional anemias: Secondary | ICD-10-CM

## 2015-04-09 DIAGNOSIS — M79671 Pain in right foot: Secondary | ICD-10-CM

## 2015-04-09 DIAGNOSIS — I10 Essential (primary) hypertension: Secondary | ICD-10-CM

## 2015-04-09 LAB — CULTURE, BLOOD (ROUTINE X 2)
CULTURE: NO GROWTH
CULTURE: NO GROWTH

## 2015-04-09 LAB — BASIC METABOLIC PANEL
ANION GAP: 9 (ref 5–15)
BUN: 14 mg/dL (ref 6–20)
CO2: 28 mmol/L (ref 22–32)
Calcium: 9.3 mg/dL (ref 8.9–10.3)
Chloride: 104 mmol/L (ref 101–111)
Creatinine, Ser: 0.76 mg/dL (ref 0.61–1.24)
GLUCOSE: 147 mg/dL — AB (ref 65–99)
POTASSIUM: 4.3 mmol/L (ref 3.5–5.1)
Sodium: 141 mmol/L (ref 135–145)

## 2015-04-09 LAB — CBC
HEMATOCRIT: 35.2 % — AB (ref 39.0–52.0)
HEMOGLOBIN: 10.6 g/dL — AB (ref 13.0–17.0)
MCH: 27.3 pg (ref 26.0–34.0)
MCHC: 30.1 g/dL (ref 30.0–36.0)
MCV: 90.7 fL (ref 78.0–100.0)
Platelets: 329 10*3/uL (ref 150–400)
RBC: 3.88 MIL/uL — AB (ref 4.22–5.81)
RDW: 14.7 % (ref 11.5–15.5)
WBC: 10.4 10*3/uL (ref 4.0–10.5)

## 2015-04-09 LAB — GLUCOSE, CAPILLARY
GLUCOSE-CAPILLARY: 112 mg/dL — AB (ref 65–99)
GLUCOSE-CAPILLARY: 178 mg/dL — AB (ref 65–99)
GLUCOSE-CAPILLARY: 140 mg/dL — AB (ref 65–99)
GLUCOSE-CAPILLARY: 132 mg/dL — AB (ref 65–99)

## 2015-04-09 LAB — PROTIME-INR
INR: 1.06 (ref 0.00–1.49)
PROTHROMBIN TIME: 14 s (ref 11.6–15.2)

## 2015-04-09 LAB — DIFFERENTIAL
BASOS ABS: 0 10*3/uL (ref 0.0–0.1)
BASOS PCT: 0 %
Eosinophils Absolute: 0 10*3/uL (ref 0.0–0.7)
Eosinophils Relative: 0 %
LYMPHS PCT: 19 %
Lymphs Abs: 2 10*3/uL (ref 0.7–4.0)
MONO ABS: 0.8 10*3/uL (ref 0.1–1.0)
Monocytes Relative: 8 %
NEUTROS ABS: 7.6 10*3/uL (ref 1.7–7.7)
Neutrophils Relative %: 73 %

## 2015-04-09 NOTE — Progress Notes (Addendum)
   Daily Progress Note  I have reviewed this patient's angiogram.  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  This chronic as there is hypertrophy of his R internal iliac artery that fills his right  profunda femoral artery.  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    - I doubt this patient can easily be revascularized.  Would probably take a: R iliofemoral EA w/ BPA (if possible), L-to-R fem-fem BPG if possible, R fem-tibial bypass. - If the right iliofemoral artery is too diseased or does not exist, a L fem-AK pop bypass followed by proximal R pop to tibial bypass will be needed. - The long term patency of a long segment bypass in this configuration is limited: 1-3 year maximum - Will be by today/tomorrow to talk with the patient in regards to his options - Would map his GSV, as if there is no adequate vein I don't think tibial bypass is an option - Cardiac risk stratification also needs to be done - if his cardiac risk is too high, he may need to consider amputation  Adele Barthel, MD [facility] [place] Office: [phone] Pager: [phone]  04/09/2015, 8:22 AM

## 2015-04-09 NOTE — Progress Notes (Signed)
Pt will allow dsg change but refuses to wear prelvalon boot for R foot.

## 2015-04-09 NOTE — Progress Notes (Signed)
Nutrition Follow-up  INTERVENTION:   -Continue Glucerna Shake po TID, each supplement provides 220 kcal and 10 grams of protein -RD to continue to monitor for additional needs  NUTRITION DIAGNOSIS:   Biting/chewing difficulty related to other (see comment) (poor dentition) as evidenced by other (see comment) (no teeth, no dentures).  Ongoing.  GOAL:   Patient will meet greater than or equal to 90% of their needs  Meeting.  MONITOR:   PO intake, Supplement acceptance, Weight trends, Labs, Skin, I & O's  ASSESSMENT:   60 y.o. male with a past medical history of schizophrenia, diabetes on oral agents, hypertension, who lives in a group home and was seen in the emergency department due to complaints of pain in the right foot, especially in the fifth toe area. Patient is a poor historian, probably due to his psychiatric illness. He tells me that he's had problems in his right foot since Thanksgiving. Unclear if he has sought any attention for this before. He complains of pain in the right foot. Complains of some chills at times. Denies any nausea, vomiting. No injuries. No falls. No passing out spells. After he received morphine in the emergency department he is feeling better. Evaluation in the emergency department revealed infected fifth right toe. We were called for further management.  Patient continues good PO intake, 100%. Pt is drinking Glucerna shakes. No new weight obtained at this time. Will monitor for additional nutritional needs.  Labs reviewed: CBGs: 112-182  Diet Order:  Diet Carb Modified Fluid consistency:: Thin; Room service appropriate?: Yes  Skin:  Wound (see comment) (DM ulcer to R pinky toe)  Last BM:  2/12  Height:   Ht Readings from Last 1 Encounters:  04/02/15 '5\' 11"'$  (1.803 m)    Weight:   Wt Readings from Last 1 Encounters:  04/02/15 182 lb (82.555 kg)    Ideal Body Weight:  78.18 kg (kg)  BMI:  Body mass index is 25.4 kg/(m^2).  Estimated  Nutritional Needs:   Kcal:  1650-1850  Protein:  82-95 grams  Fluid:  2 L/day  EDUCATION NEEDS:   No education needs identified at this time  Clayton Bibles, MS, RD, LDN Pager: [phone] After Hours Pager: [phone]

## 2015-04-09 NOTE — Progress Notes (Signed)
[facility] Progress Note  Patient: Craig Bentley GEX:[id number]   PCP: Mabeline Caras, NP DOB: 07/28/1955   DOA: 04/02/2015   DOS: 04/09/2015   Date of Service: the patient was seen and examined on 04/09/2015  Subjective: Complains of pain but it is tolerable. No nausea no vomiting. Nutrition: Tolerating oral diet Activity: Ability in the room Last BM: 04/06/2015  Assessment and Plan: 1. Diabetic foot infection ([place]) On vancomycin and cefepime and Flagyl. Awaiting input from vascular surgery regarding options for intervention. Once vascular surgery discussed with the patient regarding the options we will also involve cardiology for preoperative clearance. Continue current management continue aspirin.  2. Diabetes mellitus with complications. Continue Lantus and sliding scale insulin.  3. Schizophrenia. Stable continue home medication.  4. anemia of chronic disease. Workup is normal. Continue nutrition supplementation.  5. essential hypertension. Continue metoprolol.  DVT Prophylaxis: subcutaneous Heparin Nutrition: Diabetic diet Advance goals of care discussion: full code  Brief Summary of Hospitalization:  HPI: As per the H and P dictated on admission, "Tolbert Matheson is a 60 y.o. male with a past medical history of schizophrenia, diabetes on oral agents, hypertension, who lives in a group home and was seen in the emergency department due to complaints of pain in the right foot, especially in the fifth toe area. Patient is a poor historian, probably due to his psychiatric illness. He tells me that he's had problems in his right foot since Thanksgiving. Unclear if he has sought any attention for this before. He complains of pain in the right foot. Complains of some chills at times. Denies any nausea, vomiting. No injuries. No falls. No passing out spells. After he received morphine in the emergency department he is feeling better. Evaluation in the emergency department revealed  infected fifth right toe. We were called for further management." Daily update, Procedures: ABI, right leg angiogram Consultants: Vascular surgery Antibiotics: Anti-infectives    Start     Dose/Rate Route Frequency Ordered Stop   04/02/15 1800  vancomycin (VANCOCIN) 1,500 mg in sodium chloride 0.9 % 500 mL IVPB     1,500 mg 250 mL/hr over 120 Minutes Intravenous Every 12 hours 04/02/15 0823     04/02/15 1400  metroNIDAZOLE (FLAGYL) tablet 500 mg     500 mg Oral 3 times per day 04/02/15 1049     04/02/15 1200  ceFEPIme (MAXIPIME) 2 g in dextrose 5 % 50 mL IVPB     2 g 100 mL/hr over 30 Minutes Intravenous Every 8 hours 04/02/15 1100     04/02/15 0815  piperacillin-tazobactam (ZOSYN) IVPB 3.375 g     3.375 g 100 mL/hr over 30 Minutes Intravenous  Once 04/02/15 0807 04/02/15 0937   04/02/15 0815  vancomycin (VANCOCIN) IVPB 1000 mg/200 mL premix     1,000 mg 200 mL/hr over 60 Minutes Intravenous  Once 04/02/15 0102 04/02/15 1038       Family Communication: no family was present at bedside, at the time of interview.   Disposition:  Barriers to safe discharge: Recommendation for vascular surgery   Intake/Output Summary (Last 24 hours) at 04/09/15 1844 Last data filed at 04/09/15 1843  Gross per 24 hour  Intake   1330 ml  Output   4250 ml  Net  -2920 ml   Filed Weights   04/02/15 0846  Weight: 82.555 kg (182 lb)    Objective: Physical Exam: Filed Vitals:   04/08/15 1439 04/08/15 2132 04/09/15 0538 04/09/15 1337  BP: 127/56 135/57 126/58 114/47  Pulse: 74 86 85 83  Temp: 98 F (36.7 C) 98.5 F (36.9 C) 98 F (36.7 C) 98.9 F (37.2 C)  TempSrc: Oral Oral Oral Oral  Resp: '18 18 18 18  '$ Height:      Weight:      SpO2: 96% 91% 91% 93%     General: Appear in mild distress, no Rash; Oral Mucosa moist. Cardiovascular: S1 and S2 Present, no Murmur, no JVD Respiratory: Bilateral Air entry present and Clear to Auscultation, no Crackles, no wheezes Abdomen: Bowel Sound  present, Soft and no tenderness Extremities: no Pedal edema, no calf tenderness Right lower extremities wrapped Neurology: Grossly no focal neuro deficit.  Data Reviewed: CBC:  Recent Labs Lab 04/04/15 0420 04/05/15 0425 04/07/15 0507 04/08/15 0458 04/09/15 0440  WBC 8.5 9.3 9.4 10.3 10.4  NEUTROABS  --   --   --   --  7.6  HGB 10.0* 10.0* 10.4* 10.8* 10.6*  HCT 33.3* 33.5* 34.2* 35.4* 35.2*  MCV 89.8 90.5 90.2 90.3 90.7  PLT 284 281 313 306 967   Basic Metabolic Panel:  Recent Labs Lab 04/04/15 0420 04/05/15 0425 04/07/15 0507 04/08/15 0458 04/09/15 0440  NA 141 142 142 139 141  K 3.9 4.1 4.2 4.4 4.3  CL 105 107 104 102 104  CO2 '27 28 30 28 28  '$ GLUCOSE 204* 184* 160* 148* 147*  BUN '14 13 15 14 14  '$ CREATININE 0.73 0.72 0.86 0.64 0.76  CALCIUM 8.5* 9.1 9.6 9.2 9.3   Liver Function Tests:  Recent Labs Lab 04/03/15 0356 04/04/15 0420 04/05/15 0425  AST 14* 17 16  ALT 15* 15* 17  ALKPHOS 62 65 53  BILITOT 0.3 0.5 0.8  PROT 6.8 6.3* 6.8  ALBUMIN 3.3* 3.1* 3.2*   No results for input(s): LIPASE, AMYLASE in the last 168 hours. No results for input(s): AMMONIA in the last 168 hours.  Cardiac Enzymes: No results for input(s): CKTOTAL, CKMB, CKMBINDEX, TROPONINI in the last 168 hours.  BNP (last 3 results)  Recent Labs  02/13/15 1752  BNP 5.9    CBG:  Recent Labs Lab 04/08/15 1620 04/08/15 2201 04/09/15 0818 04/09/15 1220 04/09/15 1640  GLUCAP 182* 147* 112* 178* 140*    Recent Results (from the past 240 hour(s))  Culture, blood (routine x 2)     Status: None   Collection Time: 04/04/15  5:02 PM  Result Value Ref Range Status   Specimen Description BLOOD RIGHT ARM  Final   Special Requests BOTTLES DRAWN AEROBIC AND ANAEROBIC 10 CC EA  Final   Culture   Final    NO GROWTH 5 DAYS Performed at [facility]    Report Status 04/09/2015 FINAL  Final  Culture, blood (routine x 2)     Status: None   Collection Time: 04/04/15  5:20 PM    Result Value Ref Range Status   Specimen Description BLOOD RIGHT HAND  Final   Special Requests BOTTLES DRAWN AEROBIC AND ANAEROBIC 5 CC EA  Final   Culture   Final    NO GROWTH 5 DAYS Performed at [facility]    Report Status 04/09/2015 FINAL  Final     Studies: No results found.   Scheduled Meds: . aspirin EC  81 mg Oral Daily  . ceFEPime (MAXIPIME) IV  2 g Intravenous Q8H  . cloZAPine  100 mg Oral BID  . cloZAPine  400 mg Oral QHS  . donepezil  5 mg Oral QHS  .  enoxaparin (LOVENOX) injection  40 mg Subcutaneous Q24H  . feeding supplement (GLUCERNA SHAKE)  237 mL Oral BID BM  . gabapentin  300 mg Oral TID  . haloperidol  10 mg Oral QHS  . insulin aspart  0-15 Units Subcutaneous TID WC  . insulin aspart  3 Units Subcutaneous QAC lunch  . insulin glargine  10 Units Subcutaneous Daily  . lactulose  30 g Oral BID  . metoprolol tartrate  25 mg Oral BID  . metroNIDAZOLE  500 mg Oral 3 times per day  . pantoprazole  40 mg Oral Daily  . senna  1 tablet Oral BID  . simvastatin  5 mg Oral QHS  . trihexyphenidyl  5 mg Oral BID WC  . vancomycin  1,500 mg Intravenous Q12H   Continuous Infusions:  PRN Meds: acetaminophen **OR** acetaminophen, dicyclomine, ipratropium-albuterol, oxyCODONE-acetaminophen, polyethylene glycol  Time spent: 30 minutes  Author: Berle Mull, MD [facility] Pager: [phone] 04/09/2015 6:44 PM  If 7PM-7AM, please contact night-coverage at [email or web address], password [facility]

## 2015-04-09 NOTE — Progress Notes (Signed)
Right Lower Extremity Vein Map    Right Great Saphenous Vein   Segment Diameter Comment  1. Origin 5.44m   2. High Thigh 2.352m  3. Mid Thigh 2.5139m 4. Low Thigh 1.27m84m5. At Knee 3.10mm25m. High Calf 3.79mm 1m Low Calf 2.65mm B63mh  8. Ankle 3.39mm   84m          Right Small Saphenous Vein Not visualized   Left Lower Extremity Vein Map    Left Great Saphenous Vein    Segment Diameter Comment  1. Origin 5.23mm   253mgh Thigh 3.53mm Bran61m3. Mid Thigh 3.04mm   4. 69mThigh 3.22mm   5. A80mee 3.9mm   6. Hig35malf 3.95mm   7. Low39mf 3.17mm   8. Ankl68m98mm           80m  Left Small Saphenous Vein Not visualized  Incidental finding: There is evidence of a 3.09cm heterogenous area of the right groin and a 3.02cm heterogenous area of the left groin, suggestive of enlarged inguinal lymph nodes.  04/09/2015 2:38 PM Aasha Dina SimonetMaudry MayhewS

## 2015-04-09 NOTE — Care Management Important Message (Signed)
Important Message  Patient Details  Name: Hasan Douse MRN: [id number] Date of Birth: 1955/06/24   Medicare Important Message Given:  Yes    Camillo Flaming 04/09/2015, 11:43 AMImportant Message  Patient Details  Name: Avyay Coger MRN: [id number] Date of Birth: Sep 24, 1955   Medicare Important Message Given:  Yes    Camillo Flaming 04/09/2015, 11:42 AM

## 2015-04-10 DIAGNOSIS — I739 Peripheral vascular disease, unspecified: Secondary | ICD-10-CM | POA: Diagnosis present

## 2015-04-10 LAB — BASIC METABOLIC PANEL
ANION GAP: 9 (ref 5–15)
BUN: 15 mg/dL (ref 6–20)
CALCIUM: 9.4 mg/dL (ref 8.9–10.3)
CO2: 28 mmol/L (ref 22–32)
Chloride: 102 mmol/L (ref 101–111)
Creatinine, Ser: 0.83 mg/dL (ref 0.61–1.24)
Glucose, Bld: 159 mg/dL — ABNORMAL HIGH (ref 65–99)
Potassium: 4.2 mmol/L (ref 3.5–5.1)
Sodium: 139 mmol/L (ref 135–145)

## 2015-04-10 LAB — PROTIME-INR
INR: 1.05 (ref 0.00–1.49)
PROTHROMBIN TIME: 13.9 s (ref 11.6–15.2)

## 2015-04-10 LAB — CBC
HCT: 34.2 % — ABNORMAL LOW (ref 39.0–52.0)
HEMOGLOBIN: 10.6 g/dL — AB (ref 13.0–17.0)
MCH: 27.7 pg (ref 26.0–34.0)
MCHC: 31 g/dL (ref 30.0–36.0)
MCV: 89.5 fL (ref 78.0–100.0)
Platelets: 294 10*3/uL (ref 150–400)
RBC: 3.82 MIL/uL — AB (ref 4.22–5.81)
RDW: 14.4 % (ref 11.5–15.5)
WBC: 11.9 10*3/uL — ABNORMAL HIGH (ref 4.0–10.5)

## 2015-04-10 LAB — GLUCOSE, CAPILLARY
GLUCOSE-CAPILLARY: 103 mg/dL — AB (ref 65–99)
GLUCOSE-CAPILLARY: 111 mg/dL — AB (ref 65–99)
GLUCOSE-CAPILLARY: 81 mg/dL (ref 65–99)
Glucose-Capillary: 196 mg/dL — ABNORMAL HIGH (ref 65–99)

## 2015-04-10 MED ORDER — CEFAZOLIN SODIUM 1-5 GM-% IV SOLN
1.0000 g | Freq: Three times a day (TID) | INTRAVENOUS | Status: DC
  Administered 2015-04-10 – 2015-04-15 (×14): 1 g via INTRAVENOUS
  Filled 2015-04-10 (×19): qty 50

## 2015-04-10 NOTE — Progress Notes (Signed)
[facility] Progress Note  Patient: Craig Bentley GDJ:[id number]   PCP: Craig Caras, NP DOB: 1955/05/14   DOA: 04/02/2015   DOS: 04/10/2015   Date of Service: the patient was seen and examined on 04/10/2015  Subjective: Does not complain of pain today. No nausea no vomiting tolerating oral diet.. Nutrition: Tolerating oral diet Activity: Walking in the room Last BM: 04/06/2015  Assessment and Plan: 1. Diabetic foot infection ([place]) On vancomycin and cefepime and Flagyl. MRI is negative for osteomyelitis as well as any soft tissue swelling. Initial wound care consultation also does not show any significant discharge. At present I would discontinue broad-spectrum antibiotics and patient will be switched to IV Ancef. Prior admission MRSA PCR was negative and recheck MRSA PCR. Continue current management continue aspirin.  2. Diabetes mellitus with complications. Continue Lantus and sliding scale insulin.  3. Schizophrenia. Stable continue home medication.  4. anemia of chronic disease. Workup is normal. Continue nutrition supplementation.  5. essential hypertension. Continue metoprolol.  6. Peripheral vascular disease. Patient presents with gangrene of the right foot. Appears to have significant peripheral vascular disease. Appreciate input from vascular surgery. Given patient's schizophrenia, it is worrisome that the patient will be lost before follow-up, as an outpatient and therefore it would be preferable to get blood workup as well as treatment while the patient here is in the hospital. Cardiology has been consulted for preoperative clearance as per request from vascular surgery. Vascular surgery is scheduling a probable procedure date on Wednesday.  DVT Prophylaxis: subcutaneous Heparin Nutrition: Diabetic diet Advance goals of care discussion: full code  Brief Summary of Hospitalization:  HPI: As per the H and P dictated on admission, "Craig Bentley is a 60 y.o. male  with a past medical history of schizophrenia, diabetes on oral agents, hypertension, who lives in a group home and was seen in the emergency department due to complaints of pain in the right foot, especially in the fifth toe area. Patient is a poor historian, probably due to his psychiatric illness. He tells me that he's had problems in his right foot since Thanksgiving. Unclear if he has sought any attention for this before. He complains of pain in the right foot. Complains of some chills at times. Denies any nausea, vomiting. No injuries. No falls. No passing out spells. After he received morphine in the emergency department he is feeling better. Evaluation in the emergency department revealed infected fifth right toe. We were called for further management." Daily update, Procedures: ABI, right leg angiogram Consultants: Vascular surgery Antibiotics: Anti-infectives    Start     Dose/Rate Route Frequency Ordered Stop   04/10/15 2200  ceFAZolin (ANCEF) IVPB 1 g/50 mL premix     1 g 100 mL/hr over 30 Minutes Intravenous 3 times per day 04/10/15 1808     04/02/15 1800  vancomycin (VANCOCIN) 1,500 mg in sodium chloride 0.9 % 500 mL IVPB  Status:  Discontinued     1,500 mg 250 mL/hr over 120 Minutes Intravenous Every 12 hours 04/02/15 0823 04/10/15 1804   04/02/15 1400  metroNIDAZOLE (FLAGYL) tablet 500 mg  Status:  Discontinued     500 mg Oral 3 times per day 04/02/15 1049 04/10/15 1804   04/02/15 1200  ceFEPIme (MAXIPIME) 2 g in dextrose 5 % 50 mL IVPB  Status:  Discontinued     2 g 100 mL/hr over 30 Minutes Intravenous Every 8 hours 04/02/15 1100 04/10/15 1804   04/02/15 0815  piperacillin-tazobactam (ZOSYN) IVPB 3.375 g  3.375 g 100 mL/hr over 30 Minutes Intravenous  Once 04/02/15 0807 04/02/15 9767   04/02/15 0815  vancomycin (VANCOCIN) IVPB 1000 mg/200 mL premix     1,000 mg 200 mL/hr over 60 Minutes Intravenous  Once 04/02/15 3419 04/02/15 1038      Family Communication: no family was  present at bedside, at the time of interview.   Disposition:  Barriers to safe discharge: Cardiology clearance, further treatment for vascular surgery.   Intake/Output Summary (Last 24 hours) at 04/10/15 1918 Last data filed at 04/10/15 1443  Gross per 24 hour  Intake   1010 ml  Output   3600 ml  Net  -2590 ml   Filed Weights   04/02/15 0846  Weight: 82.555 kg (182 lb)    Objective: Physical Exam: Filed Vitals:   04/09/15 2150 04/10/15 0535 04/10/15 0928 04/10/15 1337  BP: 125/58 130/67 146/68 116/57  Pulse: 82 81 79 74  Temp: 97.5 F (36.4 C) 98 F (36.7 C)  98 F (36.7 C)  TempSrc: Oral Oral  Oral  Resp: '18 18  18  '$ Height:      Weight:      SpO2: 98% 98%  95%    General: Appear in mild distress, no Rash; Oral Mucosa moist. Cardiovascular: S1 and S2 Present, no Murmur, no JVD Respiratory: Bilateral Air entry present and Clear to Auscultation, no Crackles, no wheezes Abdomen: Bowel Sound present, Soft and no tenderness Extremities: no Pedal edema, no calf tenderness Right lower extremities wrapped  Data Reviewed: CBC:  Recent Labs Lab 04/05/15 0425 04/07/15 0507 04/08/15 0458 04/09/15 0440 04/10/15 0105  WBC 9.3 9.4 10.3 10.4 11.9*  NEUTROABS  --   --   --  7.6  --   HGB 10.0* 10.4* 10.8* 10.6* 10.6*  HCT 33.5* 34.2* 35.4* 35.2* 34.2*  MCV 90.5 90.2 90.3 90.7 89.5  PLT 281 313 306 329 379   Basic Metabolic Panel:  Recent Labs Lab 04/05/15 0425 04/07/15 0507 04/08/15 0458 04/09/15 0440 04/10/15 0105  NA 142 142 139 141 139  K 4.1 4.2 4.4 4.3 4.2  CL 107 104 102 104 102  CO2 '28 30 28 28 28  '$ GLUCOSE 184* 160* 148* 147* 159*  BUN '13 15 14 14 15  '$ CREATININE 0.72 0.86 0.64 0.76 0.83  CALCIUM 9.1 9.6 9.2 9.3 9.4   Liver Function Tests:  Recent Labs Lab 04/04/15 0420 04/05/15 0425  AST 17 16  ALT 15* 17  ALKPHOS 65 53  BILITOT 0.5 0.8  PROT 6.3* 6.8  ALBUMIN 3.1* 3.2*   No results for input(s): LIPASE, AMYLASE in the last 168 hours. No  results for input(s): AMMONIA in the last 168 hours.  Cardiac Enzymes: No results for input(s): CKTOTAL, CKMB, CKMBINDEX, TROPONINI in the last 168 hours.  BNP (last 3 results)  Recent Labs  02/13/15 1752  BNP 5.9    CBG:  Recent Labs Lab 04/09/15 2207 04/10/15 0752 04/10/15 1152 04/10/15 1412 04/10/15 1601  GLUCAP 132* 103* 196* 111* 81    Recent Results (from the past 240 hour(s))  Culture, blood (routine x 2)     Status: None   Collection Time: 04/04/15  5:02 PM  Result Value Ref Range Status   Specimen Description BLOOD RIGHT ARM  Final   Special Requests BOTTLES DRAWN AEROBIC AND ANAEROBIC 10 CC EA  Final   Culture   Final    NO GROWTH 5 DAYS Performed at [facility]    Report Status  04/09/2015 FINAL  Final  Culture, blood (routine x 2)     Status: None   Collection Time: 04/04/15  5:20 PM  Result Value Ref Range Status   Specimen Description BLOOD RIGHT HAND  Final   Special Requests BOTTLES DRAWN AEROBIC AND ANAEROBIC 5 CC EA  Final   Culture   Final    NO GROWTH 5 DAYS Performed at [facility]    Report Status 04/09/2015 FINAL  Final     Studies: No results found.   Scheduled Meds: . aspirin EC  81 mg Oral Daily  .  ceFAZolin (ANCEF) IV  1 g Intravenous 3 times per day  . cloZAPine  100 mg Oral BID  . cloZAPine  400 mg Oral QHS  . donepezil  5 mg Oral QHS  . enoxaparin (LOVENOX) injection  40 mg Subcutaneous Q24H  . feeding supplement (GLUCERNA SHAKE)  237 mL Oral BID BM  . gabapentin  300 mg Oral TID  . haloperidol  10 mg Oral QHS  . insulin aspart  0-15 Units Subcutaneous TID WC  . insulin aspart  3 Units Subcutaneous QAC lunch  . insulin glargine  10 Units Subcutaneous Daily  . lactulose  30 g Oral BID  . metoprolol tartrate  25 mg Oral BID  . pantoprazole  40 mg Oral Daily  . senna  1 tablet Oral BID  . simvastatin  5 mg Oral QHS  . trihexyphenidyl  5 mg Oral BID WC   Continuous Infusions:  PRN Meds: acetaminophen  **OR** acetaminophen, dicyclomine, ipratropium-albuterol, oxyCODONE-acetaminophen, polyethylene glycol  Time spent: 30 minutes  Author: Berle Mull, MD [facility] Pager: [phone] 04/10/2015 7:18 PM  If 7PM-7AM, please contact night-coverage at [email or web address], password [facility]

## 2015-04-10 NOTE — Progress Notes (Addendum)
   Daily Progress Note  Assessment/Planning: POD #3 s/p Ao, BRo   Tried to discuss this patient's complex arterial situation with him but he become easily confused.  I suspect that he might get lost to follow up if discharged, so I would try to get his pre-op cardiac risk stratification and optimize done while hospitalized.  If cardiac risk is acceptable, might try to do his complex revascularization on this coming Wednesday.  He minimally needs a R groin exploration with ABF vs axillobifemoral, possible R fem-AT bypass follow by R 4th and 5th toe amputations  Subjective  - 3 Days Post-Op  No complaints  Objective Filed Vitals:   04/09/15 2150 04/10/15 0535 04/10/15 0928 04/10/15 1337  BP: 125/58 130/67 146/68 116/57  Pulse: 82 81 79 74  Temp: 97.5 F (36.4 C) 98 F (36.7 C)  98 F (36.7 C)  TempSrc: Oral Oral  Oral  Resp: '18 18  18  '$ Height:      Weight:      SpO2: 98% 98%  95%    Intake/Output Summary (Last 24 hours) at 04/10/15 1542 Last data filed at 04/10/15 1443  Gross per 24 hour  Intake   1200 ml  Output   4750 ml  Net  -3550 ml    VASC  R foot bandaged, R groin without hematoma or PSA  Laboratory CBC    Component Value Date/Time   WBC 11.9* 04/10/2015 0105   HGB 10.6* 04/10/2015 0105   HCT 34.2* 04/10/2015 0105   PLT 294 04/10/2015 0105    BMET    Component Value Date/Time   NA 139 04/10/2015 0105   K 4.2 04/10/2015 0105   CL 102 04/10/2015 0105   CO2 28 04/10/2015 0105   GLUCOSE 159* 04/10/2015 0105   BUN 15 04/10/2015 0105   CREATININE 0.83 04/10/2015 0105   CALCIUM 9.4 04/10/2015 0105   GFRNONAA >60 04/10/2015 0105   GFRAA >60 04/10/2015 0105    Adele Barthel, MD [facility] Office: [phone] Pager: [phone]  04/10/2015, 3:42 PM

## 2015-04-11 DIAGNOSIS — Z01810 Encounter for preprocedural cardiovascular examination: Secondary | ICD-10-CM

## 2015-04-11 DIAGNOSIS — E1151 Type 2 diabetes mellitus with diabetic peripheral angiopathy without gangrene: Secondary | ICD-10-CM

## 2015-04-11 DIAGNOSIS — J439 Emphysema, unspecified: Secondary | ICD-10-CM

## 2015-04-11 LAB — BASIC METABOLIC PANEL
ANION GAP: 10 (ref 5–15)
BUN: 14 mg/dL (ref 6–20)
CALCIUM: 9.2 mg/dL (ref 8.9–10.3)
CHLORIDE: 103 mmol/L (ref 101–111)
CO2: 27 mmol/L (ref 22–32)
Creatinine, Ser: 0.74 mg/dL (ref 0.61–1.24)
GFR calc non Af Amer: >60 mL/min (ref >60)
GLUCOSE: 138 mg/dL — AB (ref 65–99)
Potassium: 4.1 mmol/L (ref 3.5–5.1)
Sodium: 140 mmol/L (ref 135–145)

## 2015-04-11 LAB — CBC WITH DIFFERENTIAL/PLATELET
BASOS ABS: 0 10*3/uL (ref 0.0–0.1)
BASOS PCT: 0 %
EOS ABS: 0 10*3/uL (ref 0.0–0.7)
Eosinophils Relative: 0 %
HEMATOCRIT: 34.7 % — AB (ref 39.0–52.0)
HEMOGLOBIN: 10.7 g/dL — AB (ref 13.0–17.0)
Lymphocytes Relative: 20 %
Lymphs Abs: 2 10*3/uL (ref 0.7–4.0)
MCH: 27.6 pg (ref 26.0–34.0)
MCHC: 30.8 g/dL (ref 30.0–36.0)
MCV: 89.4 fL (ref 78.0–100.0)
Monocytes Absolute: 0.8 10*3/uL (ref 0.1–1.0)
Monocytes Relative: 8 %
NEUTROS ABS: 7 10*3/uL (ref 1.7–7.7)
NEUTROS PCT: 72 %
Platelets: 336 10*3/uL (ref 150–400)
RBC: 3.88 MIL/uL — AB (ref 4.22–5.81)
RDW: 14.2 % (ref 11.5–15.5)
WBC: 9.7 10*3/uL (ref 4.0–10.5)

## 2015-04-11 LAB — GLUCOSE, CAPILLARY
GLUCOSE-CAPILLARY: 204 mg/dL — AB (ref 65–99)
GLUCOSE-CAPILLARY: 136 mg/dL — AB (ref 65–99)
GLUCOSE-CAPILLARY: 141 mg/dL — AB (ref 65–99)
GLUCOSE-CAPILLARY: 138 mg/dL — AB (ref 65–99)
GLUCOSE-CAPILLARY: 140 mg/dL — AB (ref 65–99)

## 2015-04-11 LAB — VANCOMYCIN, TROUGH: VANCOMYCIN TR: 8 ug/mL — AB (ref 10.0–20.0)

## 2015-04-11 LAB — PROTIME-INR
INR: 1.07 (ref 0.00–1.49)
Prothrombin Time: 14.1 seconds (ref 11.6–15.2)

## 2015-04-11 LAB — MRSA PCR SCREENING: MRSA by PCR: NEGATIVE

## 2015-04-11 MED ORDER — ATORVASTATIN CALCIUM 40 MG PO TABS
40.0000 mg | ORAL_TABLET | Freq: Every day | ORAL | Status: DC
  Administered 2015-04-11 – 2015-04-14 (×4): 40 mg via ORAL
  Filled 2015-04-11 (×4): qty 1

## 2015-04-11 NOTE — Progress Notes (Signed)
   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  Will obtain CT abd/pelvis with runoff to evaluate for such.  Adele Barthel, MD [facility] [place] Office: [phone] Pager: [phone]  04/11/2015, 11:04 PM

## 2015-04-11 NOTE — Progress Notes (Signed)
Called and spoke with Craig Bentley at [facility]. Set up transport for him to be taken to [facility] at 0800 for his stress test.

## 2015-04-11 NOTE — Consult Note (Signed)
Cardiologist:  New Reason for Consult: PreOp Clearance Referring Physician: [name] is an 60 y.o. male.  HPI:   Patient is a 60 year old male with history of Tobacco abuse for 30-40 years, hypertension, diabetes mellitus, schizophrenia, COPD. No obvious cardiac history. He did have an echocardiogram April 2011 which revealed ejection fraction of 55-60% with grade 1 diastolic dysfunction. He presented on February 8 with complaints of pain in his right foot.  Present is a diabetic foot infection. He is on antibiotics. MRI was negative for osteomyelitis.  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  He underwent artery aortogram and bilateral iliac arteriogram and bilateral lower extremity runoff on February 13:  FINDINGS:  "1.There are 2 renal arteries on the left and one renal artery and the right. The infrarenal aorta is widely patent. The common iliac arteries and hypogastric arteries are patent. He has bilateral external iliac artery occlusions. 2. On the right side, which is the site of concern, the distal common femoral artery and deep femoral artery are patent. The superficial femoral artery is occluded at its origin. There is reconstitution of the above-knee popliteal artery but the knee popliteal artery occludes at the knee. There is then reconstitution of the anterior tibial and posterior tibial arteries. The below-knee popliteal artery is occluded and the peroneal artery is occluded. 3. On the left side there is reconstitution of the proximal superficial femoral artery and deep femoral artery. The external iliac artery and common femoral artery are occluded. The superficial femoral artery, popliteal, anterior tibial, and posterior tibial arteries are patent. The peroneal artery and the left is occluded."  Per Dr. Bridgett Larsson: "He minimally needs a R groin exploration with ABF vs axillobifemoral, possible R fem-AT bypass follow by R 4th and 5th toe amputations"  We are seeing for  preoperative clearance.  Craig Bentley is very pleasant. He denies any cardiac or family history of heart disease.   He does not walk very much however he denies any chest pain in the last year.  He also  denies nausea, vomiting, fever, shortness of breath, orthopnea, dizziness, PND, cough, congestion, abdominal pain, hematochezia, melena, lower extremity edema.   Past Medical History  Diagnosis Date  . Hypertension   . Diabetes mellitus   . Schizophrenia, schizo-affective ([place])   . COPD (chronic obstructive pulmonary disease) ([place])   . Constipation   . Fecal impaction [facility])     Past Surgical History  Procedure Laterality Date  . Peripheral vascular catheterization N/A 04/07/2015    Procedure: Abdominal Aortogram;  Surgeon: Angelia Mould, MD;  Location: [facility];  Service: Cardiovascular;  Laterality: N/A;    Family History  Problem Relation Age of Onset  . Mental illness Mother     Social History:  reports that he has been smoking Cigarettes.  He has been smoking about 1.50 packs per day. He does not have any smokeless tobacco history on file. He reports that he does not drink alcohol or use illicit drugs.  Allergies: No Known Allergies  Medications:  Scheduled Meds: . aspirin EC  81 mg Oral Daily  .  ceFAZolin (ANCEF) IV  1 g Intravenous 3 times per day  . cloZAPine  100 mg Oral BID  . cloZAPine  400 mg Oral QHS  . donepezil  5 mg Oral QHS  . enoxaparin (LOVENOX) injection  40 mg Subcutaneous Q24H  . feeding supplement (GLUCERNA SHAKE)  237 mL Oral BID BM  . gabapentin  300  mg Oral TID  . haloperidol  10 mg Oral QHS  . insulin aspart  0-15 Units Subcutaneous TID WC  . insulin aspart  3 Units Subcutaneous QAC lunch  . insulin glargine  10 Units Subcutaneous Daily  . lactulose  30 g Oral BID  . metoprolol tartrate  25 mg Oral BID  . pantoprazole  40 mg Oral Daily  . senna  1 tablet Oral BID  . simvastatin  5 mg Oral QHS  . trihexyphenidyl  5 mg Oral BID WC     Continuous Infusions:  PRN Meds:.acetaminophen **OR** acetaminophen, dicyclomine, ipratropium-albuterol, oxyCODONE-acetaminophen, polyethylene glycol   Results for orders placed or performed during the hospital encounter of 04/02/15 (from the past 48 hour(s))  Glucose, capillary     Status: Abnormal   Collection Time: 04/09/15  4:40 PM  Result Value Ref Range   Glucose-Capillary 140 (H) 65 - 99 mg/dL  Glucose, capillary     Status: Abnormal   Collection Time: 04/09/15 10:07 PM  Result Value Ref Range   Glucose-Capillary 132 (H) 65 - 99 mg/dL  Basic metabolic panel     Status: Abnormal   Collection Time: 04/10/15  1:05 AM  Result Value Ref Range   Sodium 139 135 - 145 mmol/L   Potassium 4.2 3.5 - 5.1 mmol/L   Chloride 102 101 - 111 mmol/L   CO2 28 22 - 32 mmol/L   Glucose, Bld 159 (H) 65 - 99 mg/dL   BUN 15 6 - 20 mg/dL   Creatinine, Ser 0.83 0.61 - 1.24 mg/dL   Calcium 9.4 8.9 - 10.3 mg/dL   GFR calc non Af Amer >60 >60 mL/min   GFR calc Af Amer >60 >60 mL/min    Comment: (NOTE) The eGFR has been calculated using the CKD EPI equation. This calculation has not been validated in all clinical situations. eGFR's persistently <60 mL/min signify possible Chronic Kidney Disease.    Anion gap 9 5 - 15  CBC     Status: Abnormal   Collection Time: 04/10/15  1:05 AM  Result Value Ref Range   WBC 11.9 (H) 4.0 - 10.5 K/uL   RBC 3.82 (L) 4.22 - 5.81 MIL/uL   Hemoglobin 10.6 (L) 13.0 - 17.0 g/dL   HCT 34.2 (L) 39.0 - 52.0 %   MCV 89.5 78.0 - 100.0 fL   MCH 27.7 26.0 - 34.0 pg   MCHC 31.0 30.0 - 36.0 g/dL   RDW 14.4 11.5 - 15.5 %   Platelets 294 150 - 400 K/uL  Protime-INR     Status: None   Collection Time: 04/10/15  1:05 AM  Result Value Ref Range   Prothrombin Time 13.9 11.6 - 15.2 seconds   INR 1.05 0.00 - 1.49  Glucose, capillary     Status: Abnormal   Collection Time: 04/10/15  7:52 AM  Result Value Ref Range   Glucose-Capillary 103 (H) 65 - 99 mg/dL  Glucose,  capillary     Status: Abnormal   Collection Time: 04/10/15 11:52 AM  Result Value Ref Range   Glucose-Capillary 196 (H) 65 - 99 mg/dL  Glucose, capillary     Status: Abnormal   Collection Time: 04/10/15  2:12 PM  Result Value Ref Range   Glucose-Capillary 111 (H) 65 - 99 mg/dL  Glucose, capillary     Status: None   Collection Time: 04/10/15  4:01 PM  Result Value Ref Range   Glucose-Capillary 81 65 - 99 mg/dL  Glucose, capillary  Status: Abnormal   Collection Time: 04/10/15  9:43 PM  Result Value Ref Range   Glucose-Capillary 204 (H) 65 - 99 mg/dL  Basic metabolic panel     Status: Abnormal   Collection Time: 04/11/15  4:18 AM  Result Value Ref Range   Sodium 140 135 - 145 mmol/L   Potassium 4.1 3.5 - 5.1 mmol/L   Chloride 103 101 - 111 mmol/L   CO2 27 22 - 32 mmol/L   Glucose, Bld 138 (H) 65 - 99 mg/dL   BUN 14 6 - 20 mg/dL   Creatinine, Ser 0.74 0.61 - 1.24 mg/dL   Calcium 9.2 8.9 - 10.3 mg/dL   GFR calc non Af Amer >60 >60 mL/min   GFR calc Af Amer >60 >60 mL/min    Comment: (NOTE) The eGFR has been calculated using the CKD EPI equation. This calculation has not been validated in all clinical situations. eGFR's persistently <60 mL/min signify possible Chronic Kidney Disease.    Anion gap 10 5 - 15  Protime-INR     Status: None   Collection Time: 04/11/15  4:18 AM  Result Value Ref Range   Prothrombin Time 14.1 11.6 - 15.2 seconds   INR 1.07 0.00 - 1.49  Vancomycin, trough     Status: Abnormal   Collection Time: 04/11/15  4:18 AM  Result Value Ref Range   Vancomycin Tr 8 (L) 10.0 - 20.0 ug/mL  CBC with Differential/Platelet     Status: Abnormal   Collection Time: 04/11/15  4:18 AM  Result Value Ref Range   WBC 9.7 4.0 - 10.5 K/uL   RBC 3.88 (L) 4.22 - 5.81 MIL/uL   Hemoglobin 10.7 (L) 13.0 - 17.0 g/dL   HCT 34.7 (L) 39.0 - 52.0 %   MCV 89.4 78.0 - 100.0 fL   MCH 27.6 26.0 - 34.0 pg   MCHC 30.8 30.0 - 36.0 g/dL   RDW 14.2 11.5 - 15.5 %   Platelets 336 150 -  400 K/uL   Neutrophils Relative % 72 %   Neutro Abs 7.0 1.7 - 7.7 K/uL   Lymphocytes Relative 20 %   Lymphs Abs 2.0 0.7 - 4.0 K/uL   Monocytes Relative 8 %   Monocytes Absolute 0.8 0.1 - 1.0 K/uL   Eosinophils Relative 0 %   Eosinophils Absolute 0.0 0.0 - 0.7 K/uL   Basophils Relative 0 %   Basophils Absolute 0.0 0.0 - 0.1 K/uL  MRSA PCR Screening     Status: None   Collection Time: 04/11/15  5:42 AM  Result Value Ref Range   MRSA by PCR NEGATIVE NEGATIVE    Comment:        The GeneXpert MRSA Assay (FDA approved for NASAL specimens only), is one component of a comprehensive MRSA colonization surveillance program. It is not intended to diagnose MRSA infection nor to guide or monitor treatment for MRSA infections.   Glucose, capillary     Status: Abnormal   Collection Time: 04/11/15  7:45 AM  Result Value Ref Range   Glucose-Capillary 136 (H) 65 - 99 mg/dL  Glucose, capillary     Status: Abnormal   Collection Time: 04/11/15 11:53 AM  Result Value Ref Range   Glucose-Capillary 141 (H) 65 - 99 mg/dL    No results found.  Review of Systems  Constitutional: Negative for fever and chills.  HENT: Negative for congestion and sore throat.   Respiratory: Negative for cough and shortness of breath.   Cardiovascular: Positive for  claudication. Negative for chest pain, palpitations, orthopnea, leg swelling and PND.  Gastrointestinal: Negative for nausea, vomiting, abdominal pain, blood in stool and melena.  Musculoskeletal:       Right foot pain  Neurological: Positive for dizziness (a little dizziness sometimes). Negative for weakness.  All other systems reviewed and are negative.  Blood pressure 141/72, pulse 70, temperature 99.1 F (37.3 C), temperature source Oral, resp. rate 16, height '5\' 11"'  (1.803 m), weight 182 lb (82.555 kg), SpO2 96 %. Physical Exam  Nursing note and vitals reviewed. Constitutional: He is oriented to person, place, and time. He appears well-developed  and well-nourished. No distress.  HENT:  Head: Normocephalic and atraumatic.  Eyes: EOM are normal. Pupils are equal, round, and reactive to light. No scleral icterus.  Neck: Normal range of motion. Neck supple. No JVD present.  Cardiovascular: Normal rate, regular rhythm, S1 normal and S2 normal.   No murmur heard. Pulses:      Radial pulses are 2+ on the right side, and 2+ on the left side.  Respiratory: Effort normal. No respiratory distress. He has wheezes. He has no rales.  GI: Soft. Bowel sounds are normal. He exhibits no distension. There is no tenderness.  Musculoskeletal: He exhibits no edema.  Lymphadenopathy:    He has no cervical adenopathy.  Neurological: He is alert and oriented to person, place, and time. He exhibits normal muscle tone.  Skin: Skin is warm and dry.  Psychiatric: He has a normal mood and affect.    Assessment/Plan: Principal Problem:   Diabetic foot infection ([place]) Active Problems:   Essential hypertension   Diabetes mellitus with neurologic complication, without long-term current use of insulin (HCC)   Right foot pain   Neutrophilic leukocytosis   Absolute anemia   PVD (peripheral vascular disease) ([place])  60 year old male with history of Tobacco abuse for 30-40 years, hypertension, diabetes mellitus, schizophrenia, COPD. No obvious cardiac history. He did have an echocardiogram April 2011 which revealed ejection fraction of 55-60% with grade 1 diastolic dysfunction. He has severe lower extremity PV disease require surgery.  Troponin during the previous admission in December 2016 was negative with a BNP of 5.9.  We'll arrange for [employer] tomorrow at [facility] to assess heart function.  Tarri Fuller, Brandt 04/11/2015, 1:01 PM   I have seen, examined and evaluated the patient this PM along with Craig Samara Snide.  After reviewing all the available data and chart,  I agree with his findings, examination as well as impression recommendations.  Craig.  Magri is a very pleasant gentleman with multiple cardiac risk factors including diabetes, hyperlipidemia and hypertension as well as long-time smoking. He has clear cardiac risk equivalent of PAD. He is pending potential aortobifem versus ax-fem bypass.  He has not been all that active secondary to his wound on his right foot and claudication. At this point with his bradycardic risk factors as he has, I think we should risk stratify him with a Myoview stress test. This is scheduled for tomorrow morning. Provided this is normal, would proceed with surgery. However if it is abnormal I think we need to assess his heart prior to proceeding with a potentially high risk fashion for surgery.  He is on a statin, we will convert to atorvastatin 40. Needs close glycemic control Blood pressure is well-controlled currently, and he is on a standing beta blocker. This does help mitigate his cardiac risk for surgery.  PREOPERATIVE CARDIAC RISK ASSESSMENT   Revised Cardiac Risk Index:  High Risk Surgery: yes; potentially aortofemoral bypass  Defined as Intraperitoneal, intrathoracic or suprainguinal vascular  Active CAD: no; but unable to really assess his symptoms  CHF: no; no PND/Orthopnea or edema  Cerebrovascular Disease: no;   Diabetes: yes; On Insulin: yes  CKD (Cr >~ 2): no; Cr 0.74  Total: 2 Estimated Risk of Adverse Outcome: MODERATE RISK   Estimated Risk of MI, PE, VF/VT (Cardiac Arrest), Complete Heart Block: ~6.6 % - reduced to 3.3% with standing BB dose.   ACC/AHA Guidelines for "Clearance":  Step 1 - Need for Emergency Surgery: No: but urgent  If Yes - go straight to OR with perioperative surveillance  Step 2 - Active Cardiac Conditions (Unstable Angina, Decompensated HF, Significant  Arrhytmias - Complete HB, Mobitz II, Symptomatic VT or SVT, Severe Aortic Stenosis - mean gradient > 40 mmHg, Valve area < 1.0 cm2):   No:   If Yes - Evaluate & Treat per ACC/AHA  Guidelines  Step 3 -  Low Risk Surgery: No: HIGH RISK  If Yes --> proceed to OR  If No --> Step 4  Step 4 - Functional Capacity >= 4 METS without symptoms: No: not walking much b/c claudication & R foot pain  If Yes --> proceed to OR  If No --> Step 5  Step 5 --  Clinical Risk Factors (CRF)   3 or more: Yes  If Yes -- assess Surgical Risk, --   (High Risk Non-cardiac), Intraabdominal or thoracic vascular surgery consider testing if it will change management. --\  With his diabetes, lack of activity and significant risk factors, cannot exclude silent ischemic coronary disease. Would prefer to proceed with Myoview stress test for risk stratification.   Further recommendations based on the results of the Myoview stress test.     Ellyn Hack, Leonie Green, M.D., M.S. Interventional Cardiologist   Pager # [phone] Phone # [phone] [address] [place], [place] [address]

## 2015-04-11 NOTE — Progress Notes (Signed)
[facility] Progress Note  Patient: Craig Bentley PXT:[id number]   PCP: Mabeline Caras, NP DOB: 1955-08-09   DOA: 04/02/2015   DOS: 04/11/2015   Date of Service: the patient was seen and examined on 04/11/2015  Subjective: No acute complaint of nausea and vomiting.  Nutrition: Tolerating oral diet Activity: Walking in the room Last BM: 04/11/2015  Assessment and Plan: 1. Diabetic foot infection ([place]) On vancomycin and cefepime and Flagyl. MRI is negative for osteomyelitis as well as any soft tissue swelling. Initial wound care consultation also does not show any significant discharge. At present I would discontinue broad-spectrum antibiotics and patient will be switched to IV Ancef. We will complete a total 14 days of IV antibiotic course. MRSA PCR is negative. Continue current management, continue aspirin.  2. Diabetes mellitus with complications. Continue Lantus and sliding scale insulin.  3. Schizophrenia. Stable continue home medication.  4. anemia of chronic disease. Workup is normal. Continue nutrition supplementation.  5. essential hypertension. Continue metoprolol.  6. Peripheral vascular disease. Patient presents with gangrene of the right foot. Appears to have significant peripheral vascular disease. Appreciate input from vascular surgery.  7. Cardiac risk evaluation. Given patient's peripheral vascular disease the patient is felt to be at high risk for coronary artery disease as well as cardio pulmonary adverse outcome perioperatively. Cardiology is planning a nuclear stress test tomorrow. We will follow the results. Appreciate input from cardiology.  DVT Prophylaxis: subcutaneous Heparin Nutrition: Diabetic diet Advance goals of care discussion: full code  Brief Summary of Hospitalization:  HPI: As per the H and P dictated on admission, "Ji Feldner is a 60 y.o. male with a past medical history of schizophrenia, diabetes on oral agents, hypertension, who lives  in a group home and was seen in the emergency department due to complaints of pain in the right foot, especially in the fifth toe area. Patient is a poor historian, probably due to his psychiatric illness. He tells me that he's had problems in his right foot since Thanksgiving. Unclear if he has sought any attention for this before. He complains of pain in the right foot. Complains of some chills at times. Denies any nausea, vomiting. No injuries. No falls. No passing out spells. After he received morphine in the emergency department he is feeling better. Evaluation in the emergency department revealed infected fifth right toe. We were called for further management." Daily update, Procedures: ABI, right leg angiogram Consultants: Vascular surgery, cardiology Antibiotics: Anti-infectives    Start     Dose/Rate Route Frequency Ordered Stop   04/10/15 2200  ceFAZolin (ANCEF) IVPB 1 g/50 mL premix     1 g 100 mL/hr over 30 Minutes Intravenous 3 times per day 04/10/15 1808     04/02/15 1800  vancomycin (VANCOCIN) 1,500 mg in sodium chloride 0.9 % 500 mL IVPB  Status:  Discontinued     1,500 mg 250 mL/hr over 120 Minutes Intravenous Every 12 hours 04/02/15 0823 04/10/15 1804   04/02/15 1400  metroNIDAZOLE (FLAGYL) tablet 500 mg  Status:  Discontinued     500 mg Oral 3 times per day 04/02/15 1049 04/10/15 1804   04/02/15 1200  ceFEPIme (MAXIPIME) 2 g in dextrose 5 % 50 mL IVPB  Status:  Discontinued     2 g 100 mL/hr over 30 Minutes Intravenous Every 8 hours 04/02/15 1100 04/10/15 1804   04/02/15 0815  piperacillin-tazobactam (ZOSYN) IVPB 3.375 g     3.375 g 100 mL/hr over 30 Minutes Intravenous  Once  04/02/15 0807 04/02/15 0937   04/02/15 0815  vancomycin (VANCOCIN) IVPB 1000 mg/200 mL premix     1,000 mg 200 mL/hr over 60 Minutes Intravenous  Once 04/02/15 4174 04/02/15 1038      Family Communication: no family was present at bedside, at the time of interview.   Disposition:  Barriers to safe  discharge: IV antibiotic, stress test, further treatment for vascular surgery.   Intake/Output Summary (Last 24 hours) at 04/11/15 1625 Last data filed at 04/11/15 1358  Gross per 24 hour  Intake    240 ml  Output    900 ml  Net   -660 ml   Filed Weights   04/02/15 0846  Weight: 82.555 kg (182 lb)    Objective: Physical Exam: Filed Vitals:   04/10/15 2109 04/11/15 0523 04/11/15 1032 04/11/15 1358  BP: 166/73 142/70 141/72 130/60  Pulse: 78 79 70 78  Temp: 97.9 F (36.6 C) 99.1 F (37.3 C)  98.5 F (36.9 C)  TempSrc: Oral Oral  Oral  Resp: '16 16  16  '$ Height:      Weight:      SpO2: 97% 96%  96%    General: Appear in mild distress, no Rash; Oral Mucosa moist. Cardiovascular: S1 and S2 Present, no Murmur, no JVD Respiratory: Bilateral Air entry present and Clear to Auscultation, no Crackles, no wheezes Abdomen: Bowel Sound present, Soft and no tenderness Extremities: no Pedal edema, no calf tenderness Right lower extremities wrapped  Data Reviewed: CBC:  Recent Labs Lab 04/07/15 0507 04/08/15 0458 04/09/15 0440 04/10/15 0105 04/11/15 0418  WBC 9.4 10.3 10.4 11.9* 9.7  NEUTROABS  --   --  7.6  --  7.0  HGB 10.4* 10.8* 10.6* 10.6* 10.7*  HCT 34.2* 35.4* 35.2* 34.2* 34.7*  MCV 90.2 90.3 90.7 89.5 89.4  PLT 313 306 329 294 081   Basic Metabolic Panel:  Recent Labs Lab 04/07/15 0507 04/08/15 0458 04/09/15 0440 04/10/15 0105 04/11/15 0418  NA 142 139 141 139 140  K 4.2 4.4 4.3 4.2 4.1  CL 104 102 104 102 103  CO2 '30 28 28 28 27  '$ GLUCOSE 160* 148* 147* 159* 138*  BUN '15 14 14 15 14  '$ CREATININE 0.86 0.64 0.76 0.83 0.74  CALCIUM 9.6 9.2 9.3 9.4 9.2   Liver Function Tests:  Recent Labs Lab 04/05/15 0425  AST 16  ALT 17  ALKPHOS 53  BILITOT 0.8  PROT 6.8  ALBUMIN 3.2*   No results for input(s): LIPASE, AMYLASE in the last 168 hours. No results for input(s): AMMONIA in the last 168 hours.  Cardiac Enzymes: No results for input(s): CKTOTAL,  CKMB, CKMBINDEX, TROPONINI in the last 168 hours.  BNP (last 3 results)  Recent Labs  02/13/15 1752  BNP 5.9    CBG:  Recent Labs Lab 04/10/15 1412 04/10/15 1601 04/10/15 2143 04/11/15 0745 04/11/15 1153  GLUCAP 111* 81 204* 136* 141*    Recent Results (from the past 240 hour(s))  Culture, blood (routine x 2)     Status: None   Collection Time: 04/04/15  5:02 PM  Result Value Ref Range Status   Specimen Description BLOOD RIGHT ARM  Final   Special Requests BOTTLES DRAWN AEROBIC AND ANAEROBIC 10 CC EA  Final   Culture   Final    NO GROWTH 5 DAYS Performed at [facility]    Report Status 04/09/2015 FINAL  Final  Culture, blood (routine x 2)     Status:  None   Collection Time: 04/04/15  5:20 PM  Result Value Ref Range Status   Specimen Description BLOOD RIGHT HAND  Final   Special Requests BOTTLES DRAWN AEROBIC AND ANAEROBIC 5 CC EA  Final   Culture   Final    NO GROWTH 5 DAYS Performed at [facility]    Report Status 04/09/2015 FINAL  Final  MRSA PCR Screening     Status: None   Collection Time: 04/11/15  5:42 AM  Result Value Ref Range Status   MRSA by PCR NEGATIVE NEGATIVE Final    Comment:        The GeneXpert MRSA Assay (FDA approved for NASAL specimens only), is one component of a comprehensive MRSA colonization surveillance program. It is not intended to diagnose MRSA infection nor to guide or monitor treatment for MRSA infections.      Studies: No results found.   Scheduled Meds: . aspirin EC  81 mg Oral Daily  . atorvastatin  40 mg Oral q1800  .  ceFAZolin (ANCEF) IV  1 g Intravenous 3 times per day  . cloZAPine  100 mg Oral BID  . cloZAPine  400 mg Oral QHS  . donepezil  5 mg Oral QHS  . enoxaparin (LOVENOX) injection  40 mg Subcutaneous Q24H  . feeding supplement (GLUCERNA SHAKE)  237 mL Oral BID BM  . gabapentin  300 mg Oral TID  . haloperidol  10 mg Oral QHS  . insulin aspart  0-15 Units Subcutaneous TID WC  .  insulin aspart  3 Units Subcutaneous QAC lunch  . insulin glargine  10 Units Subcutaneous Daily  . lactulose  30 g Oral BID  . metoprolol tartrate  25 mg Oral BID  . pantoprazole  40 mg Oral Daily  . senna  1 tablet Oral BID  . trihexyphenidyl  5 mg Oral BID WC   Continuous Infusions:  PRN Meds: acetaminophen **OR** acetaminophen, dicyclomine, ipratropium-albuterol, oxyCODONE-acetaminophen, polyethylene glycol  Time spent: 30 minutes  Author: Berle Mull, MD [facility] Pager: [phone] 04/11/2015 4:25 PM  If 7PM-7AM, please contact night-coverage at [email or web address], password [email or web address]

## 2015-04-12 ENCOUNTER — Inpatient Hospital Stay (HOSPITAL_COMMUNITY)
Admit: 2015-04-12 | Discharge: 2015-04-12 | Disposition: A | Discharge status: Home / Self Care | Payer: Medicaid Other | Attending: Physician Assistant | Admitting: Physician Assistant

## 2015-04-12 ENCOUNTER — Inpatient Hospital Stay (HOSPITAL_COMMUNITY): Payer: Medicaid Other

## 2015-04-12 DIAGNOSIS — R739 Hyperglycemia, unspecified: Secondary | ICD-10-CM

## 2015-04-12 DIAGNOSIS — Z01818 Encounter for other preprocedural examination: Secondary | ICD-10-CM

## 2015-04-12 DIAGNOSIS — D649 Anemia, unspecified: Secondary | ICD-10-CM

## 2015-04-12 LAB — NM MYOCAR MULTI W/SPECT W/WALL MOTION / EF
CHL CUP RESTING HR STRESS: 71 {beats}/min
CSEPED: 0 min
CSEPEDS: 0 s
CSEPPHR: 95 {beats}/min
Estimated workload: 1 METS
MPHR: 160 {beats}/min
Percent HR: 59 %

## 2015-04-12 LAB — BASIC METABOLIC PANEL
Anion gap: 7 (ref 5–15)
BUN: 12 mg/dL (ref 6–20)
CHLORIDE: 104 mmol/L (ref 101–111)
CO2: 29 mmol/L (ref 22–32)
Calcium: 9.2 mg/dL (ref 8.9–10.3)
Creatinine, Ser: 0.71 mg/dL (ref 0.61–1.24)
GFR calc non Af Amer: >60 mL/min (ref >60)
Glucose, Bld: 119 mg/dL — ABNORMAL HIGH (ref 65–99)
POTASSIUM: 4 mmol/L (ref 3.5–5.1)
SODIUM: 140 mmol/L (ref 135–145)

## 2015-04-12 LAB — CBC
HEMATOCRIT: 38.1 % — AB (ref 39.0–52.0)
Hemoglobin: 11.4 g/dL — ABNORMAL LOW (ref 13.0–17.0)
MCH: 27.2 pg (ref 26.0–34.0)
MCHC: 29.9 g/dL — ABNORMAL LOW (ref 30.0–36.0)
MCV: 90.9 fL (ref 78.0–100.0)
Platelets: 373 10*3/uL (ref 150–400)
RBC: 4.19 MIL/uL — AB (ref 4.22–5.81)
RDW: 14.4 % (ref 11.5–15.5)
WBC: 8.4 10*3/uL (ref 4.0–10.5)

## 2015-04-12 LAB — GLUCOSE, CAPILLARY
GLUCOSE-CAPILLARY: 121 mg/dL — AB (ref 65–99)
Glucose-Capillary: 134 mg/dL — ABNORMAL HIGH (ref 65–99)
Glucose-Capillary: 108 mg/dL — ABNORMAL HIGH (ref 65–99)
Glucose-Capillary: 189 mg/dL — ABNORMAL HIGH (ref 65–99)

## 2015-04-12 LAB — PROTIME-INR
INR: 1.09 (ref 0.00–1.49)
PROTHROMBIN TIME: 14.3 s (ref 11.6–15.2)

## 2015-04-12 MED ORDER — REGADENOSON 0.4 MG/5ML IV SOLN
0.4000 mg | Freq: Once | INTRAVENOUS | Status: AC
  Administered 2015-04-12: 0.4 mg via INTRAVENOUS

## 2015-04-12 MED ORDER — IOHEXOL 350 MG/ML SOLN
100.0000 mL | Freq: Once | INTRAVENOUS | Status: AC | PRN
  Administered 2015-04-12: 100 mL via INTRAVENOUS

## 2015-04-12 MED ORDER — REGADENOSON 0.4 MG/5ML IV SOLN
INTRAVENOUS | Status: AC
  Filled 2015-04-12: qty 5

## 2015-04-12 MED ORDER — TECHNETIUM TC 99M SESTAMIBI GENERIC - CARDIOLITE
30.0000 | Freq: Once | INTRAVENOUS | Status: AC | PRN
  Administered 2015-04-12: 30 via INTRAVENOUS

## 2015-04-12 MED ORDER — TECHNETIUM TC 99M SESTAMIBI GENERIC - CARDIOLITE
10.0000 | Freq: Once | INTRAVENOUS | Status: AC | PRN
  Administered 2015-04-12: 10 via INTRAVENOUS

## 2015-04-12 NOTE — Progress Notes (Signed)
   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  Looks like this patient will likely risk stratify to low risk, so will offer him aortobifemoral byass in end-to-side configuration due to lack of retrograde perfusion of the internal iliac arteries, additionally if he is stable enough would proceed with a right CFA to PT or AT bypass with L leg GSV.  - will be by tomorrow to discuss with the patient - again, I suspect the patient may have difficult processing what I am proposing given his limitation exhibited previously - this is important as the medicare data demonstrates a mortality rate as high as 10% for open aortic procedures.   Adele Barthel, MD [facility] Office: [phone] Pager: [phone]  04/12/2015, 5:52 PM

## 2015-04-12 NOTE — Progress Notes (Signed)
CSW following for return to [facility] when medically ready. CSW has completed FL2 & will continue to follow and assist with return.    Raynaldo Opitz, [facility] Clinical Social Worker

## 2015-04-12 NOTE — Progress Notes (Signed)
   Craig Bentley presented for a lexiscan cardiolite today.  No immediate complications.  Stress imaging pending.  [name], NP 04/12/2015, 10:01 AM

## 2015-04-12 NOTE — Progress Notes (Signed)
PROGRESS NOTE  Subjective:   Patient is a 60 year old male with history of Tobacco abuse for 30-40 years, hypertension, diabetes mellitus, schizophrenia, COPD. No obvious cardiac history. He did have an echocardiogram April 2011 which revealed ejection fraction of 55-60% with grade 1 diastolic dysfunction. He presented on February 8 with complaints of pain in his right foot. Present is a diabetic foot infection. He is on antibiotics. MRI was negative for osteomyelitis. 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 He underwent artery aortogram and bilateral iliac arteriogram and bilateral lower extremity runoff on February 13  We were asked to do cardiac pre-op eval prior to leg surgery  Pt has   Normal LV function . No CP or dyspnea.  No known cardiac issues.    Objective:    Vital Signs:   Temp:  [98.5 F (36.9 C)-98.9 F (37.2 C)] 98.9 F (37.2 C) (02/18 0507) Pulse Rate:  [70-79] 79 (02/18 0507) Resp:  [15-16] 15 (02/18 0507) BP: (130-141)/(60-72) 135/65 mmHg (02/18 0507) SpO2:  [90 %-97 %] 97 % (02/18 0507)  Last BM Date: 04/11/15   24-hour weight change: Weight change:   Weight trends: Filed Weights   04/02/15 0846  Weight: 182 lb (82.555 kg)    Intake/Output:  02/17 0701 - 02/18 0700 In: 240 [P.O.:240] Out: 1350 [Urine:1350]     Physical Exam: BP 135/65 mmHg  Pulse 79  Temp(Src) 98.9 F (37.2 C) (Oral)  Resp 15  Ht '5\' 11"'$  (1.803 m)  Wt 182 lb (82.555 kg)  BMI 25.40 kg/m2  SpO2 97%  Wt Readings from Last 3 Encounters:  04/02/15 182 lb (82.555 kg)  02/16/15 182 lb 8.7 oz (82.8 kg)  09/09/13 178 lb (80.74 kg)    General: Vital signs reviewed and noted.   Head: Normocephalic, atraumatic.  Eyes: conjunctivae/corneas clear.  EOM's intact.   Throat: normal  Neck:  normal   Lungs:    clear  Heart:  RR, normal S1, S2   Abdomen:  Soft, non-tender, non-distended    Extremities: Right foot is bandaged    Neurologic: A&O X3, CN II - XII are grossly  intact.   Psych: Normal     Labs: BMET:  Recent Labs  04/11/15 0418 04/12/15 0509  NA 140 140  K 4.1 4.0  CL 103 104  CO2 27 29  GLUCOSE 138* 119*  BUN 14 12  CREATININE 0.74 0.71  CALCIUM 9.2 9.2    Liver function tests: No results for input(s): AST, ALT, ALKPHOS, BILITOT, PROT, ALBUMIN in the last 72 hours. No results for input(s): LIPASE, AMYLASE in the last 72 hours.  CBC:  Recent Labs  04/11/15 0418 04/12/15 0509  WBC 9.7 8.4  NEUTROABS 7.0  --   HGB 10.7* 11.4*  HCT 34.7* 38.1*  MCV 89.4 90.9  PLT 336 373    Cardiac Enzymes: No results for input(s): CKTOTAL, CKMB, TROPONINI in the last 72 hours.  Coagulation Studies:  Recent Labs  04/10/15 0105 04/11/15 0418 04/12/15 0509  LABPROT 13.9 14.1 14.3  INR 1.05 1.07 1.09    Other: Invalid input(s): POCBNP No results for input(s): DDIMER in the last 72 hours. No results for input(s): HGBA1C in the last 72 hours. No results for input(s): CHOL, HDL, LDLCALC, TRIG, CHOLHDL in the last 72 hours. No results for input(s): TSH, T4TOTAL, T3FREE, THYROIDAB in the last 72 hours.  Invalid input(s): FREET3 No results for input(s): VITAMINB12, FOLATE, FERRITIN, TIBC, IRON, RETICCTPCT in the last 72  hours.   Other results:    Medications:    Infusions:    Scheduled Medications: . aspirin EC  81 mg Oral Daily  . atorvastatin  40 mg Oral q1800  .  ceFAZolin (ANCEF) IV  1 g Intravenous 3 times per day  . cloZAPine  100 mg Oral BID  . cloZAPine  400 mg Oral QHS  . donepezil  5 mg Oral QHS  . enoxaparin (LOVENOX) injection  40 mg Subcutaneous Q24H  . feeding supplement (GLUCERNA SHAKE)  237 mL Oral BID BM  . gabapentin  300 mg Oral TID  . haloperidol  10 mg Oral QHS  . insulin aspart  0-15 Units Subcutaneous TID WC  . insulin aspart  3 Units Subcutaneous QAC lunch  . insulin glargine  10 Units Subcutaneous Daily  . lactulose  30 g Oral BID  . metoprolol tartrate  25 mg Oral BID  . pantoprazole  40  mg Oral Daily  . senna  1 tablet Oral BID  . trihexyphenidyl  5 mg Oral BID WC    Assessment/ Plan:   Principal Problem:   Diabetic foot infection ([place]) Active Problems:   Essential hypertension   COPD (chronic obstructive pulmonary disease) (HCC)   Diabetes mellitus with neurologic complication, without long-term current use of insulin (HCC)   Right foot pain   Neutrophilic leukocytosis   Absolute anemia   PVD (peripheral vascular disease) (HCC)   Preoperative cardiovascular examination  1. PAD:   Pt may need aorto bifem surgery. Will get a Lexiscan myoview prior to surgery . Has normal LV systolic function by echo   No signs of CHF at present    Disposition: for myoview study today   Length of Stay: 10  Thayer Headings, Brooke Bonito., MD, [facility] 04/12/2015, 7:28 AM Office [phone] Pager [phone]

## 2015-04-12 NOTE — Progress Notes (Signed)
[facility] Progress Note  Patient: Craig Bentley HBZ:[id number]   PCP: Mabeline Caras, NP DOB: 01/12/1956   DOA: 04/02/2015   DOS: 04/12/2015   Date of Service: the patient was seen and examined on 04/12/2015  Subjective: Does not have any acute complaint at present pain is also resolved. Patient will be undergoing stress test today. Nutrition: Tolerating oral diet Activity: Walking in the room Last BM: 04/11/2015  Assessment and Plan: 1. Diabetic foot infection ([place]) On vancomycin and cefepime and Flagyl. MRI is negative for osteomyelitis as well as any soft tissue swelling. Initial wound care consultation also does not show any significant discharge. At present I would discontinue broad-spectrum antibiotics and patient will be switched to IV Ancef. We will complete a total 14 days of IV antibiotic course. MRSA PCR is negative. Continue current management, continue aspirin.  2. Diabetes mellitus with complications. Continue Lantus and sliding scale insulin.  3. Schizophrenia. Stable continue home medication.  4. anemia of chronic disease. Workup is normal. Continue nutrition supplementation.  5. essential hypertension. Continue metoprolol.  6. Peripheral vascular disease. Patient presents with gangrene of the right foot. Appears to have significant peripheral vascular disease. Appreciate input from vascular surgery. As per recommendation from the vascular surgery the patient will be transferred to [facility] for further vascular workup.  7. Cardiac risk evaluation. Given patient's peripheral vascular disease the patient is felt to be at high risk for coronary artery disease as well as cardio pulmonary adverse outcome perioperatively. Nuclear stress test appears to be low risk. We will await recommendations from the cardiology.  DVT Prophylaxis: subcutaneous Heparin Nutrition: Diabetic diet Advance goals of care discussion: full code  Brief Summary of  Hospitalization:  HPI: As per the H and P dictated on admission, "Craig Bentley is a 60 y.o. male with a past medical history of schizophrenia, diabetes on oral agents, hypertension, who lives in a group home and was seen in the emergency department due to complaints of pain in the right foot, especially in the fifth toe area. Patient is a poor historian, probably due to his psychiatric illness. He tells me that he's had problems in his right foot since Thanksgiving. Unclear if he has sought any attention for this before. He complains of pain in the right foot. Complains of some chills at times. Denies any nausea, vomiting. No injuries. No falls. No passing out spells. After he received morphine in the emergency department he is feeling better. Evaluation in the emergency department revealed infected fifth right toe. We were called for further management." Daily update, Procedures: ABI, right leg angiogram Consultants: Vascular surgery, cardiology Antibiotics: Anti-infectives    Start     Dose/Rate Route Frequency Ordered Stop   04/10/15 2200  ceFAZolin (ANCEF) IVPB 1 g/50 mL premix     1 g 100 mL/hr over 30 Minutes Intravenous 3 times per day 04/10/15 1808     04/02/15 1800  vancomycin (VANCOCIN) 1,500 mg in sodium chloride 0.9 % 500 mL IVPB  Status:  Discontinued     1,500 mg 250 mL/hr over 120 Minutes Intravenous Every 12 hours 04/02/15 0823 04/10/15 1804   04/02/15 1400  metroNIDAZOLE (FLAGYL) tablet 500 mg  Status:  Discontinued     500 mg Oral 3 times per day 04/02/15 1049 04/10/15 1804   04/02/15 1200  ceFEPIme (MAXIPIME) 2 g in dextrose 5 % 50 mL IVPB  Status:  Discontinued     2 g 100 mL/hr over 30 Minutes Intravenous Every 8  hours 04/02/15 1100 04/10/15 1804   04/02/15 0815  piperacillin-tazobactam (ZOSYN) IVPB 3.375 g     3.375 g 100 mL/hr over 30 Minutes Intravenous  Once 04/02/15 0807 04/02/15 0937   04/02/15 0815  vancomycin (VANCOCIN) IVPB 1000 mg/200 mL premix     1,000 mg 200  mL/hr over 60 Minutes Intravenous  Once 04/02/15 9371 04/02/15 1038      Family Communication: no family was present at bedside, at the time of interview.   Disposition:  Barriers to safe discharge: IV antibiotic, stress test, further treatment for vascular surgery.   Intake/Output Summary (Last 24 hours) at 04/12/15 1906 Last data filed at 04/12/15 1900  Gross per 24 hour  Intake    714 ml  Output    750 ml  Net    -36 ml   Filed Weights   04/02/15 0846  Weight: 82.555 kg (182 lb)    Objective: Physical Exam: Filed Vitals:   04/11/15 1032 04/11/15 1358 04/11/15 2102 04/12/15 0507  BP: 141/72 130/60 138/61 135/65  Pulse: 70 78 76 79  Temp:  98.5 F (36.9 C) 98.7 F (37.1 C) 98.9 F (37.2 C)  TempSrc:  Oral Oral Oral  Resp:  '16 16 15  '$ Height:      Weight:      SpO2:  96% 90% 97%    General: Appear in mild distress, no Rash; Oral Mucosa moist. Cardiovascular: S1 and S2 Present, no Murmur, no JVD Respiratory: Bilateral Air entry present and Clear to Auscultation, no Crackles, no wheezes Abdomen: Bowel Sound present, Soft and no tenderness Extremities: no Pedal edema, no calf tenderness Right lower extremities wrapped  Data Reviewed: CBC:  Recent Labs Lab 04/08/15 0458 04/09/15 0440 04/10/15 0105 04/11/15 0418 04/12/15 0509  WBC 10.3 10.4 11.9* 9.7 8.4  NEUTROABS  --  7.6  --  7.0  --   HGB 10.8* 10.6* 10.6* 10.7* 11.4*  HCT 35.4* 35.2* 34.2* 34.7* 38.1*  MCV 90.3 90.7 89.5 89.4 90.9  PLT 306 329 294 336 696   Basic Metabolic Panel:  Recent Labs Lab 04/08/15 0458 04/09/15 0440 04/10/15 0105 04/11/15 0418 04/12/15 0509  NA 139 141 139 140 140  K 4.4 4.3 4.2 4.1 4.0  CL 102 104 102 103 104  CO2 '28 28 28 27 29  '$ GLUCOSE 148* 147* 159* 138* 119*  BUN '14 14 15 14 12  '$ CREATININE 0.64 0.76 0.83 0.74 0.71  CALCIUM 9.2 9.3 9.4 9.2 9.2   Liver Function Tests: No results for input(s): AST, ALT, ALKPHOS, BILITOT, PROT, ALBUMIN in the last 168 hours. No  results for input(s): LIPASE, AMYLASE in the last 168 hours. No results for input(s): AMMONIA in the last 168 hours.  Cardiac Enzymes: No results for input(s): CKTOTAL, CKMB, CKMBINDEX, TROPONINI in the last 168 hours.  BNP (last 3 results)  Recent Labs  02/13/15 1752  BNP 5.9    CBG:  Recent Labs Lab 04/11/15 1639 04/11/15 2101 04/12/15 0739 04/12/15 1148 04/12/15 1605  GLUCAP 138* 140* 121* 134* 108*    Recent Results (from the past 240 hour(s))  Culture, blood (routine x 2)     Status: None   Collection Time: 04/04/15  5:02 PM  Result Value Ref Range Status   Specimen Description BLOOD RIGHT ARM  Final   Special Requests BOTTLES DRAWN AEROBIC AND ANAEROBIC 10 CC EA  Final   Culture   Final    NO GROWTH 5 DAYS Performed at [facility]  Report Status 04/09/2015 FINAL  Final  Culture, blood (routine x 2)     Status: None   Collection Time: 04/04/15  5:20 PM  Result Value Ref Range Status   Specimen Description BLOOD RIGHT HAND  Final   Special Requests BOTTLES DRAWN AEROBIC AND ANAEROBIC 5 CC EA  Final   Culture   Final    NO GROWTH 5 DAYS Performed at [facility]    Report Status 04/09/2015 FINAL  Final  MRSA PCR Screening     Status: None   Collection Time: 04/11/15  5:42 AM  Result Value Ref Range Status   MRSA by PCR NEGATIVE NEGATIVE Final    Comment:        The GeneXpert MRSA Assay (FDA approved for NASAL specimens only), is one component of a comprehensive MRSA colonization surveillance program. It is not intended to diagnose MRSA infection nor to guide or monitor treatment for MRSA infections.      Studies: Ct Angio Ao+bifem W/cm &/or Wo/cm  04/12/2015  CLINICAL DATA:  Gangrenous left foot. EXAM: CT ANGIOGRAPHY AOBIFEM WITHOUT AND WITH CONTRAST TECHNIQUE: Arterial phase CT images of the abdomen, pelvis and complete bilateral lower extremities were obtained, with multi plantar maximum intensity projection reformats generated.  CONTRAST:  163m OMNIPAQUE IOHEXOL 350 MG/ML SOLN COMPARISON:  09/25/2012 CT abdomen/ pelvis. FINDINGS: Lower chest: There are multiple slightly irregular thin-walled cystic structures at both lung bases, which have slightly increased in size since 09/25/2012. Mild circumferential wall thickening in the lower thoracic esophagus. Hepatobiliary: Normal liver with no liver mass. Normal gallbladder with no radiopaque cholelithiasis. No biliary ductal dilatation. Pancreas: Normal, with no mass or duct dilation. Spleen: Normal size. No mass. Adrenals/Urinary Tract: There is stable irregular nodular thickening of both adrenal glands measuring up to 15 mm thickness in the left adrenal gland, in keeping with nodular adrenal hyperplasia versus multiple adenomas. No hydronephrosis. No contour deforming renal mass. Nonspecific diffuse bladder wall thickening. Stomach/Bowel: Grossly normal stomach. Normal caliber small bowel with no small bowel wall thickening. Normal appendix. No large bowel wall thickening or pericolonic fat stranding. No bowel pneumatosis. Vascular/Lymphatic: Abdominal Aorta: Atherosclerotic nonaneurysmal abdominal aorta. Celiac Artery: Patent. SMA: Patent. IMA: High-grade atherosclerotic stenosis at the origin, approximately 70%. Renal Arteries: Two renal arteries bilaterally, all patent. Patent renal veins. No pathologically enlarged lymph nodes in the abdomen or pelvis. Reproductive: Stable top-normal size prostate with nonspecific internal prostatic calcification. Other: No pneumoperitoneum, ascites or focal fluid collection. Musculoskeletal: No aggressive appearing focal osseous lesions. Stable chronic mild L4 vertebral compression fracture. Mild-to-moderate degenerative changes in the visualized thoracolumbar spine. RIGHT Lower Extremity: Common Iliac Artery: Marked atherosclerosis, with no high-grade stenosis. External Iliac Artery: Occlusion just distal to the right common iliac artery bifurcation,  new since 09/25/2012 Internal Iliac Artery: Marked atherosclerosis, with no high-grade stenosis. Common Femoral Artery: Reconstituted proximally by a right abdominal muscle wall collaterals, no high-grade stenosis. Superficial Femoral Artery: Occluded proximally just beyond the right common femoral artery bifurcation, with distal reconstitution via profunda collaterals. Deep Femoral Artery: Atherosclerotic and patent, with no high-grade stenosis. Popliteal Artery: Occluded distally. Tibioperonal Trunk: Reconstituted proximally by periarticular collaterals, atherosclerotic with no high-grade stenosis. Anterior Tibial Artery: Reconstituted proximal and by periarticular collaterals, atherosclerotic without appreciable high-grade stenosis. Posterior Tibial Artery: Atherosclerotic and patent with runoff to the posterior foot arterial arcade. Peroneal Artery: Occluded distally just above the ankle. Dorsalis Pedis Artery: Patent with runoff to the anterior foot arterial arcade. LEFT Lower Extremity: Common Iliac Artery: Marked atherosclerosis,  with no high-grade stenosis. External Iliac Artery: Proximal occlusion just distal to the left common iliac artery bifurcation, which is chronic and unchanged since 09/25/2012. Internal Iliac Artery: Marked atherosclerosis, with no high-grade stenosis. Common Femoral Artery: Occluded. Superficial Femoral Artery: Reconstituted proximally by collaterals, atherosclerotic without high-grade stenosis. Deep Femoral Artery: Patent. Popliteal Artery: Atherosclerotic and patent, with no high-grade stenosis. Tibioperonal Trunk: Atherosclerotic and patent with no high-grade stenosis. Anterior Tibial Artery: Atherosclerotic and patent with no high-grade stenosis. Posterior Tibial Artery: Atherosclerotic and patent with runoff to the posterior left foot arterial arcade. Peroneal Artery: Occluded proximally. Dorsalis Pedis Artery: Patent with runoff to the anterior foot arterial arcade. Review of  the MIP images confirms the above findings. IMPRESSION: 1. 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 Two-vessel runoff to the left foot, fed predominantly by left internal iliac and left abdominopelvic muscle wall collaterals. 2. Right external iliac artery occlusion, new since 09/25/2012. Proximal right superficial femoral artery occlusion. Distal right popliteal artery occlusion. Two-vessel runoff to the right foot, fed predominantly by profunda collaterals. 3. No acute abnormality in the abdomen or pelvis. Nonspecific diffuse bladder wall thickening, probably chronic, correlate with urinalysis to exclude acute urinary tract infection. 4. Mild progression of thin-walled cysts at both lung bases, which could indicate a nonspecific cystic interstitial lung disease. Correlate with high-resolution chest CT on a short term outpatient basis as clinically warranted. Electronically Signed   By: Ilona Sorrel M.D.   On: 04/12/2015 16:45   Nm Myocar Multi W/spect W/wall Motion / Ef  04/12/2015  CLINICAL DATA:  59 year old male with right foot gangrene. Cardiac clearance prior to surgery. EXAM: MYOCARDIAL IMAGING WITH SPECT (REST AND PHARMACOLOGIC-STRESS) GATED LEFT VENTRICULAR WALL MOTION STUDY LEFT VENTRICULAR EJECTION FRACTION TECHNIQUE: Standard myocardial SPECT imaging was performed after resting intravenous injection of 10 mCi [id number]. Subsequently, intravenous infusion of Lexiscan was performed under the supervision of the Cardiology staff. At peak effect of the drug, 30 mCi Tc-921mestamibi was injected intravenously and standard myocardial SPECT imaging was performed. Quantitative gated imaging was also performed to evaluate left ventricular wall motion, and estimate left ventricular ejection fraction. COMPARISON:  CT scan of the chest 02/20/2015 FINDINGS: Perfusion: No decreased activity in the left ventricle on stress imaging to suggest reversible ischemia or infarction.  Wall Motion: Normal left ventricular wall motion. No left ventricular dilation. Left Ventricular Ejection Fraction: 56 % End diastolic volume 92 ml End systolic volume 40 ml IMPRESSION: 1. No reversible ischemia or infarction. 2. Normal left ventricular wall motion. 3. Left ventricular ejection fraction 56% 4. Low-risk stress test findings*. *2012 Appropriate Use Criteria for Coronary Revascularization Focused Update: J Am Coll Cardiol. 206144;31(5):[email or web address] Electronically Signed   By: HeJacqulynn Cadet.D.   On: 04/12/2015 13:08     Scheduled Meds: . aspirin EC  81 mg Oral Daily  . atorvastatin  40 mg Oral q1800  .  ceFAZolin (ANCEF) IV  1 g Intravenous 3 times per day  . cloZAPine  100 mg Oral BID  . cloZAPine  400 mg Oral QHS  . donepezil  5 mg Oral QHS  . enoxaparin (LOVENOX) injection  40 mg Subcutaneous Q24H  . feeding supplement (GLUCERNA SHAKE)  237 mL Oral BID BM  . gabapentin  300 mg Oral TID  . haloperidol  10 mg Oral QHS  . insulin aspart  0-15 Units Subcutaneous TID WC  . insulin aspart  3 Units Subcutaneous QAC lunch  . insulin glargine  10 Units  Subcutaneous Daily  . lactulose  30 g Oral BID  . metoprolol tartrate  25 mg Oral BID  . pantoprazole  40 mg Oral Daily  . senna  1 tablet Oral BID  . trihexyphenidyl  5 mg Oral BID WC   Continuous Infusions:  PRN Meds: acetaminophen **OR** acetaminophen, dicyclomine, ipratropium-albuterol, oxyCODONE-acetaminophen, polyethylene glycol  Time spent: 30 minutes  Author: Berle Mull, MD [facility] Pager: [phone] 04/12/2015 7:06 PM  If 7PM-7AM, please contact night-coverage at [email or web address], password [facility]

## 2015-04-13 DIAGNOSIS — I70269 Atherosclerosis of native arteries of extremities with gangrene, unspecified extremity: Secondary | ICD-10-CM | POA: Insufficient documentation

## 2015-04-13 LAB — GLUCOSE, CAPILLARY
GLUCOSE-CAPILLARY: 192 mg/dL — AB (ref 65–99)
GLUCOSE-CAPILLARY: 154 mg/dL — AB (ref 65–99)
Glucose-Capillary: 156 mg/dL — ABNORMAL HIGH (ref 65–99)
Glucose-Capillary: 174 mg/dL — ABNORMAL HIGH (ref 65–99)

## 2015-04-13 NOTE — Progress Notes (Signed)
PROGRESS NOTE  Subjective:   Patient is a 60 year old male with history of Tobacco abuse for 30-40 years, hypertension, diabetes mellitus, schizophrenia, COPD. No obvious cardiac history. He did have an echocardiogram April 2011 which revealed ejection fraction of 55-60% with grade 1 diastolic dysfunction. He presented on February 8 with complaints of pain in his right foot. Present is a diabetic foot infection. He is on antibiotics. MRI was negative for osteomyelitis. 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 He underwent artery aortogram and bilateral iliac arteriogram and bilateral lower extremity runoff on February 13  We were asked to do cardiac pre-op eval prior to leg surgery  Pt has   Normal LV function . No CP or dyspnea.  No known cardiac issues.   myoview yesterday was normal.   No ischemia.  Normal LV function with EF of 56%  Objective:    Vital Signs:   Temp:  [98.2 F (36.8 C)-98.6 F (37 C)] 98.6 F (37 C) (02/19 0536) Pulse Rate:  [84-87] 84 (02/19 0536) Resp:  [16-18] 16 (02/19 0536) BP: (113-149)/(56-76) 128/69 mmHg (02/19 0536) SpO2:  [96 %-98 %] 96 % (02/19 0536)  Last BM Date: 04/11/15   24-hour weight change: Weight change:   Weight trends: Filed Weights   04/02/15 0846  Weight: 182 lb (82.555 kg)    Intake/Output:  02/18 0701 - 02/19 0700 In: 914 [P.O.:714; IV Piggyback:200] Out: 1100 [Urine:1100]     Physical Exam: BP 128/69 mmHg  Pulse 84  Temp(Src) 98.6 F (37 C) (Oral)  Resp 16  Ht '5\' 11"'$  (1.803 m)  Wt 182 lb (82.555 kg)  BMI 25.40 kg/m2  SpO2 96%  Wt Readings from Last 3 Encounters:  04/02/15 182 lb (82.555 kg)  02/16/15 182 lb 8.7 oz (82.8 kg)  09/09/13 178 lb (80.74 kg)    General: Vital signs reviewed and noted.   Head: Normocephalic, atraumatic.  Eyes: conjunctivae/corneas clear.  EOM's intact.   Throat: normal  Neck:  normal   Lungs:    clear  Heart:  RR, normal S1, S2   Abdomen:  Soft, non-tender,  non-distended    Extremities: Right foot is bandaged    Neurologic: A&O X3, CN II - XII are grossly intact.   Psych: Normal     Labs: BMET:  Recent Labs  04/11/15 0418 04/12/15 0509  NA 140 140  K 4.1 4.0  CL 103 104  CO2 27 29  GLUCOSE 138* 119*  BUN 14 12  CREATININE 0.74 0.71  CALCIUM 9.2 9.2    Liver function tests: No results for input(s): AST, ALT, ALKPHOS, BILITOT, PROT, ALBUMIN in the last 72 hours. No results for input(s): LIPASE, AMYLASE in the last 72 hours.  CBC:  Recent Labs  04/11/15 0418 04/12/15 0509  WBC 9.7 8.4  NEUTROABS 7.0  --   HGB 10.7* 11.4*  HCT 34.7* 38.1*  MCV 89.4 90.9  PLT 336 373    Cardiac Enzymes: No results for input(s): CKTOTAL, CKMB, TROPONINI in the last 72 hours.  Coagulation Studies:  Recent Labs  04/11/15 0418 04/12/15 0509  LABPROT 14.1 14.3  INR 1.07 1.09    Other: Invalid input(s): POCBNP No results for input(s): DDIMER in the last 72 hours. No results for input(s): HGBA1C in the last 72 hours. No results for input(s): CHOL, HDL, LDLCALC, TRIG, CHOLHDL in the last 72 hours. No results for input(s): TSH, T4TOTAL, T3FREE, THYROIDAB in the last 72 hours.  Invalid input(s): FREET3  No results for input(s): VITAMINB12, FOLATE, FERRITIN, TIBC, IRON, RETICCTPCT in the last 72 hours.   Other results:    Medications:    Infusions:    Scheduled Medications: . aspirin EC  81 mg Oral Daily  . atorvastatin  40 mg Oral q1800  .  ceFAZolin (ANCEF) IV  1 g Intravenous 3 times per day  . cloZAPine  100 mg Oral BID  . cloZAPine  400 mg Oral QHS  . donepezil  5 mg Oral QHS  . enoxaparin (LOVENOX) injection  40 mg Subcutaneous Q24H  . feeding supplement (GLUCERNA SHAKE)  237 mL Oral BID BM  . gabapentin  300 mg Oral TID  . haloperidol  10 mg Oral QHS  . insulin aspart  0-15 Units Subcutaneous TID WC  . insulin aspart  3 Units Subcutaneous QAC lunch  . insulin glargine  10 Units Subcutaneous Daily  . lactulose   30 g Oral BID  . metoprolol tartrate  25 mg Oral BID  . pantoprazole  40 mg Oral Daily  . senna  1 tablet Oral BID  . trihexyphenidyl  5 mg Oral BID WC    Assessment/ Plan:   Principal Problem:   Diabetic foot infection ([place]) Active Problems:   Essential hypertension   COPD (chronic obstructive pulmonary disease) ([facility])   Diabetes mellitus with neurologic complication, without long-term current use of insulin ([facility])   Right foot pain   Neutrophilic leukocytosis   Absolute anemia   PVD (peripheral vascular disease) ([facility])   Preoperative cardiovascular examination   Hyperglycemia  1. PAD:   Pt may need aorto bifem surgery. The Lexiscan myoview was normal. At this point, there are no cardiac issues that would place him at additional risk.   I have read Dr. Lianne Moris note from yesterday and recognize that the proposed surgery is inherently risky.   No signs of CHF at present    Disposition: will sign off. Call for questions.   Length of Stay: 60  Thayer Headings, Brooke Bonito., MD, [facility] 04/13/2015, 7:39 AM Office [phone] Pager [phone]

## 2015-04-13 NOTE — Progress Notes (Signed)
[facility] Progress Note  Patient: Craig Bentley TDS:[id number]   PCP: Mabeline Caras, NP DOB: 04-02-1955   DOA: 04/02/2015   DOS: 04/13/2015   Date of Service: the patient was seen and examined on 04/13/2015  Subjective: Patient denies any complaint of chest pain no nausea no vomiting no diarrhea.  Nutrition: Tolerating oral diet Activity: Walking in the room Last BM: 04/11/2015  Assessment and Plan: 1. Diabetic foot infection ([place]) Initially started on On vancomycin and cefepime and Flagyl. MRI is negative for osteomyelitis At present I would discontinue broad-spectrum antibiotics and patient aren't Milbert Coulter IV Ancef. We will complete a total 14 days of IV antibiotic course. MRSA PCR is negative. Continue current management, continue aspirin.  2. Diabetes mellitus with complications. Continue Lantus and sliding scale insulin.  3. Schizophrenia. Stable continue home medication.  4. anemia of chronic disease. Workup is normal. Continue nutrition supplementation.  5. essential hypertension. Continue metoprolol.  6. Peripheral vascular disease. Patient presents with gangrene of the right foot. Appears to have significant peripheral vascular disease. Appreciate input from vascular surgery. As per recommendation from the vascular surgery the patient will be transferred to [facility] for further vascular workup.  7. Cardiac risk evaluation. Given patient's peripheral vascular disease the patient is felt to be at high risk for coronary artery disease as well as cardio pulmonary adverse outcome perioperatively. Nuclear stress test appears to be low risk.   DVT Prophylaxis: subcutaneous Heparin Nutrition: Diabetic diet Advance goals of care discussion: full code  Brief Summary of Hospitalization:  HPI: As per the H and P dictated on admission, "Craig Bentley is a 60 y.o. male with a past medical history of schizophrenia, diabetes on oral agents, hypertension, who lives in a  group home and was seen in the emergency department due to complaints of pain in the right foot, especially in the fifth toe area. Patient is a poor historian, probably due to his psychiatric illness. He tells me that he's had problems in his right foot since Thanksgiving. Unclear if he has sought any attention for this before. He complains of pain in the right foot. Complains of some chills at times. Denies any nausea, vomiting. No injuries. No falls. No passing out spells. After he received morphine in the emergency department he is feeling better. Evaluation in the emergency department revealed infected fifth right toe. We were called for further management." Daily update, Procedures: ABI, right leg angiogram Consultants: Vascular surgery, cardiology Antibiotics: Anti-infectives    Start     Dose/Rate Route Frequency Ordered Stop   04/10/15 2200  ceFAZolin (ANCEF) IVPB 1 g/50 mL premix     1 g 100 mL/hr over 30 Minutes Intravenous 3 times per day 04/10/15 1808     04/02/15 1800  vancomycin (VANCOCIN) 1,500 mg in sodium chloride 0.9 % 500 mL IVPB  Status:  Discontinued     1,500 mg 250 mL/hr over 120 Minutes Intravenous Every 12 hours 04/02/15 0823 04/10/15 1804   04/02/15 1400  metroNIDAZOLE (FLAGYL) tablet 500 mg  Status:  Discontinued     500 mg Oral 3 times per day 04/02/15 1049 04/10/15 1804   04/02/15 1200  ceFEPIme (MAXIPIME) 2 g in dextrose 5 % 50 mL IVPB  Status:  Discontinued     2 g 100 mL/hr over 30 Minutes Intravenous Every 8 hours 04/02/15 1100 04/10/15 1804   04/02/15 0815  piperacillin-tazobactam (ZOSYN) IVPB 3.375 g     3.375 g 100 mL/hr over 30 Minutes Intravenous  Once 04/02/15 0807 04/02/15 0937   04/02/15 0815  vancomycin (VANCOCIN) IVPB 1000 mg/200 mL premix     1,000 mg 200 mL/hr over 60 Minutes Intravenous  Once 04/02/15 0737 04/02/15 1038      Family Communication: no family was present at bedside, at the time of interview.   Disposition:  Barriers to safe  discharge: IV antibiotic, stress test, further treatment for vascular surgery.   Intake/Output Summary (Last 24 hours) at 04/13/15 1603 Last data filed at 04/13/15 1400  Gross per 24 hour  Intake   1697 ml  Output   1902 ml  Net   -205 ml   Filed Weights   04/02/15 0846  Weight: 82.555 kg (182 lb)    Objective: Physical Exam: Filed Vitals:   04/11/15 2102 04/12/15 0507 04/12/15 2113 04/13/15 0536  BP: 138/61 135/65 124/68 128/69  Pulse: 76 79 87 84  Temp: 98.7 F (37.1 C) 98.9 F (37.2 C) 98.2 F (36.8 C) 98.6 F (37 C)  TempSrc: Oral Oral Oral Oral  Resp: '16 15 18 16  '$ Height:      Weight:      SpO2: 90% 97% 98% 96%    General: Appear in mild distress, no Rash; Oral Mucosa moist. Cardiovascular: S1 and S2 Present, no Murmur, no JVD Respiratory: Bilateral Air entry present and Clear to Auscultation, no Crackles, no wheezes Abdomen: Bowel Sound present, Soft and no tenderness Extremities: no Pedal edema, no calf tenderness Right lower extremities wrapped  Data Reviewed: CBC:  Recent Labs Lab 04/08/15 0458 04/09/15 0440 04/10/15 0105 04/11/15 0418 04/12/15 0509  WBC 10.3 10.4 11.9* 9.7 8.4  NEUTROABS  --  7.6  --  7.0  --   HGB 10.8* 10.6* 10.6* 10.7* 11.4*  HCT 35.4* 35.2* 34.2* 34.7* 38.1*  MCV 90.3 90.7 89.5 89.4 90.9  PLT 306 329 294 336 106   Basic Metabolic Panel:  Recent Labs Lab 04/08/15 0458 04/09/15 0440 04/10/15 0105 04/11/15 0418 04/12/15 0509  NA 139 141 139 140 140  K 4.4 4.3 4.2 4.1 4.0  CL 102 104 102 103 104  CO2 '28 28 28 27 29  '$ GLUCOSE 148* 147* 159* 138* 119*  BUN '14 14 15 14 12  '$ CREATININE 0.64 0.76 0.83 0.74 0.71  CALCIUM 9.2 9.3 9.4 9.2 9.2   Liver Function Tests: No results for input(s): AST, ALT, ALKPHOS, BILITOT, PROT, ALBUMIN in the last 168 hours. No results for input(s): LIPASE, AMYLASE in the last 168 hours. No results for input(s): AMMONIA in the last 168 hours.  Cardiac Enzymes: No results for input(s):  CKTOTAL, CKMB, CKMBINDEX, TROPONINI in the last 168 hours.  BNP (last 3 results)  Recent Labs  02/13/15 1752  BNP 5.9   CBG:  Recent Labs Lab 04/12/15 1148 04/12/15 1605 04/12/15 2144 04/13/15 0749 04/13/15 1214  GLUCAP 134* 108* 189* 192* 156*    Recent Results (from the past 240 hour(s))  Culture, blood (routine x 2)     Status: None   Collection Time: 04/04/15  5:02 PM  Result Value Ref Range Status   Specimen Description BLOOD RIGHT ARM  Final   Special Requests BOTTLES DRAWN AEROBIC AND ANAEROBIC 10 CC EA  Final   Culture   Final    NO GROWTH 5 DAYS Performed at [facility]    Report Status 04/09/2015 FINAL  Final  Culture, blood (routine x 2)     Status: None   Collection Time: 04/04/15  5:20 PM  Result Value Ref Range Status   Specimen Description BLOOD RIGHT HAND  Final   Special Requests BOTTLES DRAWN AEROBIC AND ANAEROBIC 5 CC EA  Final   Culture   Final    NO GROWTH 5 DAYS Performed at [facility]    Report Status 04/09/2015 FINAL  Final  MRSA PCR Screening     Status: None   Collection Time: 04/11/15  5:42 AM  Result Value Ref Range Status   MRSA by PCR NEGATIVE NEGATIVE Final    Comment:        The GeneXpert MRSA Assay (FDA approved for NASAL specimens only), is one component of a comprehensive MRSA colonization surveillance program. It is not intended to diagnose MRSA infection nor to guide or monitor treatment for MRSA infections.     Studies: No results found.   Scheduled Meds: . aspirin EC  81 mg Oral Daily  . atorvastatin  40 mg Oral q1800  .  ceFAZolin (ANCEF) IV  1 g Intravenous 3 times per day  . cloZAPine  100 mg Oral BID  . cloZAPine  400 mg Oral QHS  . donepezil  5 mg Oral QHS  . enoxaparin (LOVENOX) injection  40 mg Subcutaneous Q24H  . feeding supplement (GLUCERNA SHAKE)  237 mL Oral BID BM  . gabapentin  300 mg Oral TID  . haloperidol  10 mg Oral QHS  . insulin aspart  0-15 Units Subcutaneous TID WC  .  insulin aspart  3 Units Subcutaneous QAC lunch  . insulin glargine  10 Units Subcutaneous Daily  . lactulose  30 g Oral BID  . metoprolol tartrate  25 mg Oral BID  . pantoprazole  40 mg Oral Daily  . senna  1 tablet Oral BID  . trihexyphenidyl  5 mg Oral BID WC   Continuous Infusions:  PRN Meds: acetaminophen **OR** acetaminophen, dicyclomine, ipratropium-albuterol, oxyCODONE-acetaminophen, polyethylene glycol  Time spent: 30 minutes  Author: Berle Mull, MD [facility] Pager: [phone] 04/13/2015 4:03 PM  If 7PM-7AM, please contact night-coverage at [email or web address], password [email or web address]

## 2015-04-13 NOTE — Progress Notes (Signed)
04/13/15  1800  Report given to [place] on [facility] [address] at [facility] [phone].

## 2015-04-13 NOTE — Progress Notes (Signed)
04/13/15 1530  Call [phone] to request bed at [facility]. Was told there was no bed available for what the patient needed yet.

## 2015-04-13 NOTE — Progress Notes (Signed)
04/13/15  1808  Called [facility] to transport patient to [facility] with Coralyn Mark.

## 2015-04-13 NOTE — Progress Notes (Signed)
   Daily Progress Note  Assessment/Planning: POD #6 s/p Ao, BRo   Cardiac risk stratification appears to be acceptable for ABF  As predicted, this patient is NOT able to fully understand the implications of proceed with an ABF with R CFA to PT bypass.  Minimally I suspect the combined procedure to have a 5-8% mortality risk with 15-30% morbidity rate.  He is NOT able to process all the scenarios I have discussed with him.  Everytime we discuss his options, he fixates on dying and I can't redirect him back to the issue at hand.  I have concerns he is NOT mentally competent to make a decision in this case.    I would get input from Pscyh/Ethics and also get Palliative Care involve to help the patient work through the issues.  I will not schedule this patient for surgery until this issue is resolved  Subjective  - 6 Days Post-Op  No complaints  Objective Filed Vitals:   04/11/15 2102 04/12/15 0507 04/12/15 2113 04/13/15 0536  BP: 138/61 135/65 124/68 128/69  Pulse: 76 79 87 84  Temp: 98.7 F (37.1 C) 98.9 F (37.2 C) 98.2 F (36.8 C) 98.6 F (37 C)  TempSrc: Oral Oral Oral Oral  Resp: '16 15 18 16  '$ Height:      Weight:      SpO2: 90% 97% 98% 96%    Intake/Output Summary (Last 24 hours) at 04/13/15 1745 Last data filed at 04/13/15 1400  Gross per 24 hour  Intake   1697 ml  Output   1902 ml  Net   -205 ml    VASC  R 5th toe with dry gangrene, 4th toe ulcer appears to be healing with padding between the 4th/5th toes  Laboratory CBC    Component Value Date/Time   WBC 8.4 04/12/2015 0509   HGB 11.4* 04/12/2015 0509   HCT 38.1* 04/12/2015 0509   PLT 373 04/12/2015 0509    BMET    Component Value Date/Time   NA 140 04/12/2015 0509   K 4.0 04/12/2015 0509   CL 104 04/12/2015 0509   CO2 29 04/12/2015 0509   GLUCOSE 119* 04/12/2015 0509   BUN 12 04/12/2015 0509   CREATININE 0.71 04/12/2015 0509   CALCIUM 9.2 04/12/2015 0509   GFRNONAA >60 04/12/2015 0509   GFRAA >60 04/12/2015 1975    Adele Barthel, MD [facility] [place] Office: [phone] Pager: [phone]  04/13/2015, 5:45 PM

## 2015-04-14 DIAGNOSIS — F200 Paranoid schizophrenia: Secondary | ICD-10-CM

## 2015-04-14 DIAGNOSIS — L089 Local infection of the skin and subcutaneous tissue, unspecified: Secondary | ICD-10-CM

## 2015-04-14 DIAGNOSIS — I739 Peripheral vascular disease, unspecified: Secondary | ICD-10-CM

## 2015-04-14 DIAGNOSIS — E1169 Type 2 diabetes mellitus with other specified complication: Secondary | ICD-10-CM

## 2015-04-14 DIAGNOSIS — F203 Undifferentiated schizophrenia: Secondary | ICD-10-CM

## 2015-04-14 DIAGNOSIS — E0840 Diabetes mellitus due to underlying condition with diabetic neuropathy, unspecified: Secondary | ICD-10-CM

## 2015-04-14 LAB — SURGICAL PCR SCREEN
MRSA, PCR: NEGATIVE
STAPHYLOCOCCUS AUREUS: NEGATIVE

## 2015-04-14 LAB — GLUCOSE, CAPILLARY
GLUCOSE-CAPILLARY: 165 mg/dL — AB (ref 65–99)
Glucose-Capillary: 137 mg/dL — ABNORMAL HIGH (ref 65–99)
Glucose-Capillary: 121 mg/dL — ABNORMAL HIGH (ref 65–99)
Glucose-Capillary: 145 mg/dL — ABNORMAL HIGH (ref 65–99)
Glucose-Capillary: 122 mg/dL — ABNORMAL HIGH (ref 65–99)

## 2015-04-14 NOTE — Care Management Important Message (Signed)
Important Message  Patient Details  Name: Craig Bentley MRN: [id number] Date of Birth: 03-13-1955   Medicare Important Message Given:  Yes    Loann Quill 04/14/2015, 11:38 AM

## 2015-04-14 NOTE — Progress Notes (Signed)
PROGRESS NOTE  Quency Tober IHK:[id number] DOB: 06/01/1955 DOA: 04/02/2015 PCP: Mabeline Caras, NP  HPI/Recap of past 46 hours: 60 year old male with past mental history of diabetes mellitus with secondary right diabetic foot infection with poor healing plus schizophrenia admitted 2/8.  Osteomyelitis ruled out and patient completed full antibiotic course. Seen by vascular surgery who have recommended bypass rather than amputation as best course. Patient himself with no complaints. Seen by psychiatry today who deemed the patient competent. Spoke with vascular surgery who do not have her monitor scheduled this week  Assessment/Plan: Principal Problem:   Diabetic foot infection ([place]) with course complicated by peripheral vascular disease: Completed antibiotic course. Needs bypass which will be done next week as outpatient. Vascular surgery to set up. Active Problems:   Essential hypertension   COPD (chronic obstructive pulmonary disease) ([place])   Diabetes mellitus with neurologic complication, without long-term current use of insulin ([place]): Blood pressure is currently stable   schizophrenia: Return back to group home tomorrow  Code Status: Full code    Family Communication: no family present    Disposition Plan: return back to group home tomorrow     Consultants:  Vascular surgery  Orthopedic surgery  Psychiatry    Procedures:  None    Antibiotics:  Completed 14 days antibiotics     Objective: BP 134/68 mmHg  Pulse 68  Temp(Src) 98.4 F (36.9 C) (Oral)  Resp 16  Ht 6' (1.829 m)  Wt 82.555 kg (182 lb)  BMI 25.40 kg/m2  SpO2 98%  Intake/Output Summary (Last 24 hours) at 04/14/15 1728 Last data filed at 04/14/15 1638  Gross per 24 hour  Intake    960 ml  Output   2775 ml  Net  -1815 ml   Filed Weights   04/02/15 0846  Weight: 82.555 kg (182 lb)    Exam:   General:  alert and oriented 2    Cardiovascular: regular rate and rhythm, S1-S2    Respiratory:  clear to auscultation bilaterally    Abdomen: Soft, nontender, nondistended, positive bowel sounds    Musculoskeletal: 1+ pitting edema with evidence of neuropathy, chronic ulceration of right fourth/fifth toe     Data Reviewed: Basic Metabolic Panel:  Recent Labs Lab 04/08/15 0458 04/09/15 0440 04/10/15 0105 04/11/15 0418 04/12/15 0509  NA 139 141 139 140 140  K 4.4 4.3 4.2 4.1 4.0  CL 102 104 102 103 104  CO2 '28 28 28 27 29  '$ GLUCOSE 148* 147* 159* 138* 119*  BUN '14 14 15 14 12  '$ CREATININE 0.64 0.76 0.83 0.74 0.71  CALCIUM 9.2 9.3 9.4 9.2 9.2   Liver Function Tests: No results for input(s): AST, ALT, ALKPHOS, BILITOT, PROT, ALBUMIN in the last 168 hours. No results for input(s): LIPASE, AMYLASE in the last 168 hours. No results for input(s): AMMONIA in the last 168 hours. CBC:  Recent Labs Lab 04/08/15 0458 04/09/15 0440 04/10/15 0105 04/11/15 0418 04/12/15 0509  WBC 10.3 10.4 11.9* 9.7 8.4  NEUTROABS  --  7.6  --  7.0  --   HGB 10.8* 10.6* 10.6* 10.7* 11.4*  HCT 35.4* 35.2* 34.2* 34.7* 38.1*  MCV 90.3 90.7 89.5 89.4 90.9  PLT 306 329 294 336 373   Cardiac Enzymes:   No results for input(s): CKTOTAL, CKMB, CKMBINDEX, TROPONINI in the last 168 hours. BNP (last 3 results)  Recent Labs  02/13/15 1752  BNP 5.9    ProBNP (last 3 results) No results for input(s): PROBNP in the  last 8760 hours.  CBG:  Recent Labs Lab 04/13/15 2120 04/13/15 2205 04/14/15 0727 04/14/15 1124 04/14/15 1636  GLUCAP 165* 154* 137* 121* 145*    Recent Results (from the past 240 hour(s))  MRSA PCR Screening     Status: None   Collection Time: 04/11/15  5:42 AM  Result Value Ref Range Status   MRSA by PCR NEGATIVE NEGATIVE Final    Comment:        The GeneXpert MRSA Assay (FDA approved for NASAL specimens only), is one component of a comprehensive MRSA colonization surveillance program. It is not intended to diagnose MRSA infection nor to guide or monitor treatment  for MRSA infections.   Surgical pcr screen     Status: None   Collection Time: 04/14/15  6:00 AM  Result Value Ref Range Status   MRSA, PCR NEGATIVE NEGATIVE Final   Staphylococcus aureus NEGATIVE NEGATIVE Final    Comment:        The Xpert SA Assay (FDA approved for NASAL specimens in patients over 16 years of age), is one component of a comprehensive surveillance program.  Test performance has been validated by [facility] for patients greater than or equal to 71 year old. It is not intended to diagnose infection nor to guide or monitor treatment.      Studies: No results found.  Scheduled Meds: . aspirin EC  81 mg Oral Daily  . atorvastatin  40 mg Oral q1800  .  ceFAZolin (ANCEF) IV  1 g Intravenous 3 times per day  . cloZAPine  100 mg Oral BID  . cloZAPine  400 mg Oral QHS  . donepezil  5 mg Oral QHS  . enoxaparin (LOVENOX) injection  40 mg Subcutaneous Q24H  . feeding supplement (GLUCERNA SHAKE)  237 mL Oral BID BM  . gabapentin  300 mg Oral TID  . haloperidol  10 mg Oral QHS  . insulin aspart  0-15 Units Subcutaneous TID WC  . insulin aspart  3 Units Subcutaneous QAC lunch  . insulin glargine  10 Units Subcutaneous Daily  . lactulose  30 g Oral BID  . metoprolol tartrate  25 mg Oral BID  . pantoprazole  40 mg Oral Daily  . senna  1 tablet Oral BID  . trihexyphenidyl  5 mg Oral BID WC    Continuous Infusions:    Time spent: 15 minutes   [facility] Pager [phone] . If 7PM-7AM, please contact night-coverage at [email or web address], password [facility] 04/14/2015, 5:28 PM  LOS: 12 days

## 2015-04-14 NOTE — Progress Notes (Signed)
   Daily Progress Note   Patient's options are: 1.  Maximal medical mgmt: wound care, abx as needed, pain meds as needed 2.  Amputation of R 5th toe, possible 4th toe amp: unlikely to help 3.  R BKA vs AKA: unlikely to help, even AKA might not be to heal, which would require a hip dysarticulation (done by Ortho) 4.  ABF with R fem-tib bypass, Amputation of R 5th toe, possible 4th toe amp: 5-8% mortality rate, 15-30% morbidity  Pt has NOT been able to vocalize his choice of procedures during multiple visits to date.  Will not schedule this patient until the competence issue is resolved, as he has not been able to demonstrate a clear stream of thought to date.  If this is not resolved by tomorrow, will not be able to operate on this patient until next week.     Adele Barthel, MD [facility] [place] Office: [phone] Pager: [phone]  04/14/2015, 8:10 AM

## 2015-04-14 NOTE — Consult Note (Signed)
[facility] Consult   Reason for Consult:  Capacity evaluation Referring Physician:  Dr. [name] Patient Identification: Craig Bentley MRN:  [id number] Principal Diagnosis: Diabetic foot infection [facility]) Diagnosis:   Patient Active Problem List   Diagnosis Date Noted  . Atherosclerosis of extremity with gangrene ([place]) [I70.269]   . Hyperglycemia [R73.9]   . Preoperative cardiovascular examination [Z01.810] 04/11/2015  . PVD (peripheral vascular disease) ([place]) [I73.9] 04/10/2015  . Absolute anemia [D64.9]   . Diabetic foot infection ([place]) [E11.69, L08.9] 04/02/2015  . Right foot pain [M79.671] 04/02/2015  . Neutrophilic leukocytosis [S96.2] 04/02/2015  . Esophageal abnormality [K22.9]   . HCAP (healthcare-associated pneumonia) [J18.9]   . COPD exacerbation ([place]) [J44.1]   . Dyspnea [R06.00]   . Respiratory distress [R06.00]   . Sepsis due to pneumonia ([place]) [J18.9, A41.9] 02/14/2015  . Acute respiratory failure with hypoxia ([place]) [J96.00] 02/13/2015  . Diabetes mellitus with neurologic complication, without long-term current use of insulin ([place]) [E11.49] 07/20/2011  . Essential hypertension [I10]   . COPD (chronic obstructive pulmonary disease) ([place]) [J44.9]     Total Time spent with patient: 1 hour  Subjective:   Craig Bentley is a 60 y.o. male patient admitted with diabetic foot infection.  HPI:  Craig Bentley is a 60 y.o. Male seen, chart reviewed for face-to-face psychiatric consultation and evaluation of capacity to make his own medical decisions. Patient reportedly suffering from chronic schizophrenia and has been receiving medication management from [facility] mental health and lives in [facility] assisted living facility over 25 years. Patient appeared lying in his bed, awake, alert, oriented to time place person and situation. Patient has intact cognitions including orientation, memory, concentration and language functions. Patient is also able to understand he has a  diabetic foot infection and physicians/surgeons or recommending surgical intervention and patient is willing to provide consent for treatment. Patient denies active symptoms of auditory/visual hallucinations, delusions, paranoia, depression, anxiety and does not appear to be responding to internal stimuli. Patient has no snack to suicidal/homicidal ideation, intention or plans.   Past Psychiatric History: Patient has been diagnosed with chronic schizophrenia and has been compliant with his medication management at [facility] mental health as scheduled and has no known adverse effect of the medication.  Risk to Self: Is patient at risk for suicide?: No Risk to Others:   Prior Inpatient Therapy:   Prior Outpatient Therapy:    Past Medical History:  Past Medical History  Diagnosis Date  . Hypertension   . Diabetes mellitus   . Schizophrenia, schizo-affective ([place])   . COPD (chronic obstructive pulmonary disease) ([place])   . Constipation   . Fecal impaction [facility])     Past Surgical History  Procedure Laterality Date  . Peripheral vascular catheterization N/A 04/07/2015    Procedure: Abdominal Aortogram;  Surgeon: Angelia Mould, MD;  Location: [facility];  Service: Cardiovascular;  Laterality: N/A;   Family History:  Family History  Problem Relation Age of Onset  . Mental illness Mother    Family Psychiatric  History: Denied family history of mental illness and reportedly family lives in [place] in [place] History:  History  Alcohol Use No     History  Drug Use No    Social History   Social History  . Marital Status: Single    Spouse Name: N/A  . Number of Children: N/A  . Years of Education: N/A   Social History Main Topics  . Smoking status: Current Every Day  Smoker -- 1.50 packs/day    Types: Cigarettes  . Smokeless tobacco: None  . Alcohol Use: No  . Drug Use: No  . Sexual Activity: No   Other Topics Concern  . None   Social History  Narrative   Additional Social History:    Allergies:  No Known Allergies  Labs:  Results for orders placed or performed during the hospital encounter of 04/02/15 (from the past 48 hour(s))  Glucose, capillary     Status: Abnormal   Collection Time: 04/12/15 11:48 AM  Result Value Ref Range   Glucose-Capillary 134 (H) 65 - 99 mg/dL  Glucose, capillary     Status: Abnormal   Collection Time: 04/12/15  4:05 PM  Result Value Ref Range   Glucose-Capillary 108 (H) 65 - 99 mg/dL  Glucose, capillary     Status: Abnormal   Collection Time: 04/12/15  9:44 PM  Result Value Ref Range   Glucose-Capillary 189 (H) 65 - 99 mg/dL  Glucose, capillary     Status: Abnormal   Collection Time: 04/13/15  7:49 AM  Result Value Ref Range   Glucose-Capillary 192 (H) 65 - 99 mg/dL  Glucose, capillary     Status: Abnormal   Collection Time: 04/13/15 12:14 PM  Result Value Ref Range   Glucose-Capillary 156 (H) 65 - 99 mg/dL  Glucose, capillary     Status: Abnormal   Collection Time: 04/13/15  4:55 PM  Result Value Ref Range   Glucose-Capillary 174 (H) 65 - 99 mg/dL  Glucose, capillary     Status: Abnormal   Collection Time: 04/13/15  9:20 PM  Result Value Ref Range   Glucose-Capillary 165 (H) 65 - 99 mg/dL  Glucose, capillary     Status: Abnormal   Collection Time: 04/13/15 10:05 PM  Result Value Ref Range   Glucose-Capillary 154 (H) 65 - 99 mg/dL  Surgical pcr screen     Status: None   Collection Time: 04/14/15  6:00 AM  Result Value Ref Range   MRSA, PCR NEGATIVE NEGATIVE   Staphylococcus aureus NEGATIVE NEGATIVE    Comment:        The Xpert SA Assay (FDA approved for NASAL specimens in patients over 57 years of age), is one component of a comprehensive surveillance program.  Test performance has been validated by [facility] for patients greater than or equal to 55 year old. It is not intended to diagnose infection nor to guide or monitor treatment.   Glucose, capillary     Status:  Abnormal   Collection Time: 04/14/15  7:27 AM  Result Value Ref Range   Glucose-Capillary 137 (H) 65 - 99 mg/dL    Current Facility-Administered Medications  Medication Dose Route Frequency Provider Last Rate Last Dose  . acetaminophen (TYLENOL) tablet 650 mg  650 mg Oral Q6H PRN Bonnielee Haff, MD   650 mg at 04/05/15 1501   Or  . acetaminophen (TYLENOL) suppository 650 mg  650 mg Rectal Q6H PRN Bonnielee Haff, MD      . aspirin EC tablet 81 mg  81 mg Oral Daily Reyne Dumas, MD   81 mg at 04/14/15 1024  . atorvastatin (LIPITOR) tablet 40 mg  40 mg Oral q1800 Brett Canales, PA-C   40 mg at 04/13/15 1712  . ceFAZolin (ANCEF) IVPB 1 g/50 mL premix  1 g Intravenous 3 times per day Lavina Hamman, MD   1 g at 04/14/15 0550  . cloZAPine (CLOZARIL) tablet 100 mg  100  mg Oral BID Bonnielee Haff, MD   100 mg at 04/14/15 1036  . cloZAPine (CLOZARIL) tablet 400 mg  400 mg Oral QHS Bonnielee Haff, MD   400 mg at 04/13/15 2255  . dicyclomine (BENTYL) tablet 20 mg  20 mg Oral BID PRN Bonnielee Haff, MD   20 mg at 04/12/15 2303  . donepezil (ARICEPT) tablet 5 mg  5 mg Oral QHS Bonnielee Haff, MD   5 mg at 04/13/15 2246  . enoxaparin (LOVENOX) injection 40 mg  40 mg Subcutaneous Q24H Bonnielee Haff, MD   40 mg at 04/13/15 1700  . feeding supplement (GLUCERNA SHAKE) (GLUCERNA SHAKE) liquid 237 mL  237 mL Oral BID BM Bonnielee Haff, MD   237 mL at 04/14/15 1030  . gabapentin (NEURONTIN) capsule 300 mg  300 mg Oral TID Bonnielee Haff, MD   300 mg at 04/14/15 1030  . haloperidol (HALDOL) tablet 10 mg  10 mg Oral QHS Bonnielee Haff, MD   10 mg at 04/13/15 2252  . insulin aspart (novoLOG) injection 0-15 Units  0-15 Units Subcutaneous TID WC Bonnielee Haff, MD   2 Units at 04/14/15 0900  . insulin aspart (novoLOG) injection 3 Units  3 Units Subcutaneous QAC lunch Janece Canterbury, MD   3 Units at 04/13/15 1233  . insulin glargine (LANTUS) injection 10 Units  10 Units Subcutaneous Daily Reyne Dumas, MD   10 Units  at 04/13/15 2256  . ipratropium-albuterol (DUONEB) 0.5-2.5 (3) MG/3ML nebulizer solution 3 mL  3 mL Nebulization Q6H PRN Bonnielee Haff, MD      . lactulose (CHRONULAC) 10 GM/15ML solution 30 g  30 g Oral BID Bonnielee Haff, MD   30 g at 04/14/15 1024  . metoprolol tartrate (LOPRESSOR) tablet 25 mg  25 mg Oral BID Bonnielee Haff, MD   25 mg at 04/14/15 1024  . oxyCODONE-acetaminophen (PERCOCET/ROXICET) 5-325 MG per tablet 1-2 tablet  1-2 tablet Oral Q3H PRN Angelia Mould, MD   2 tablet at 04/14/15 0554  . pantoprazole (PROTONIX) EC tablet 40 mg  40 mg Oral Daily Bonnielee Haff, MD   40 mg at 04/14/15 1025  . polyethylene glycol (MIRALAX / GLYCOLAX) packet 17 g  17 g Oral Daily PRN Bonnielee Haff, MD      . senna (SENOKOT) tablet 8.6 mg  1 tablet Oral BID Bonnielee Haff, MD   8.6 mg at 04/14/15 1024  . trihexyphenidyl (ARTANE) tablet 5 mg  5 mg Oral BID WC Bonnielee Haff, MD   5 mg at 04/14/15 1035    Musculoskeletal: Strength & Muscle Tone: decreased Gait & Station: unable to stand Patient leans: N/A  Psychiatric Specialty Exam: ROS diabetic foot ulcers, denied nausea, vomiting, abdomen pain, shortness of breath and chest pain. No Fever-chills, No Headache, No changes with Vision or hearing, reports vertigo No problems swallowing food or Liquids, No Chest pain, Cough or Shortness of Breath, No Abdominal pain, No Nausea or Vommitting, Bowel movements are regular, No Blood in stool or Urine, No dysuria, No new skin rashes or bruises, No new joints pains-aches,  No new weakness, tingling, numbness in any extremity, No recent weight gain or loss, No polyuria, polydypsia or polyphagia,   A full 10 point Review of Systems was done, except as stated above, all other Review of Systems were negative.  Blood pressure 151/84, pulse 78, temperature 98.4 F (36.9 C), temperature source Oral, resp. rate 16, height 6' (1.829 m), weight 82.555 kg (182 lb), SpO2 100 %.Body mass index is  24.68 kg/(m^2).  General Appearance: Casual  Eye Contact::  Good  Speech:  Clear and Coherent  Volume:  Normal  Mood:  Euthymic  Affect:  Appropriate and Congruent  Thought Process:  Coherent and Goal Directed  Orientation:  Full (Time, Place, and Person)  Thought Content:  WDL  Suicidal Thoughts:  No  Homicidal Thoughts:  No  Memory:  Immediate;   Good Recent;   Good  Judgement:  Intact  Insight:  Good  Psychomotor Activity:  Normal  Concentration:  Good  Recall:  Good  Fund of Knowledge:Good  Language: Good  Akathisia:  Negative  Handed:  Right  AIMS (if indicated):     Assets:  Communication Skills Desire for Improvement Financial Resources/Insurance Housing Leisure Time Resilience Transportation  ADL's:  Intact  Cognition: WNL  Sleep:      Treatment Plan Summary: Patient meet criteria for capacity to make his own medical decision based on my psychiatric evaluation today Patient has no acute symptoms of psychosis, depression and anxiety Patient is willing to provide voluntarily consent for surgery  Disposition: Patient will be referred to the outpatient psychiatric services at [facility] mental health and medically stable Patient does not meet criteria for psychiatric inpatient admission. Supportive therapy provided about ongoing stressors.  Durward Parcel., MD 04/14/2015 11:01 AM

## 2015-04-15 ENCOUNTER — Other Ambulatory Visit: Payer: Self-pay

## 2015-04-15 DIAGNOSIS — I70261 Atherosclerosis of native arteries of extremities with gangrene, right leg: Secondary | ICD-10-CM

## 2015-04-15 LAB — GLUCOSE, CAPILLARY
GLUCOSE-CAPILLARY: 119 mg/dL — AB (ref 65–99)
GLUCOSE-CAPILLARY: 168 mg/dL — AB (ref 65–99)

## 2015-04-15 MED ORDER — NICOTINE 21 MG/24HR TD PT24: 21.0000 mg | MEDICATED_PATCH | Freq: Every day | TRANSDERMAL | Status: DC

## 2015-04-15 MED ORDER — OXYCODONE-ACETAMINOPHEN 5-325 MG PO TABS: 1.0000 | ORAL_TABLET | ORAL | Status: DC | PRN

## 2015-04-15 NOTE — Discharge Summary (Signed)
Discharge Summary  Craig Bentley PRF:[id number] DOB: [birth year]  PCP: Mabeline Caras, NP  Admit date: 04/02/2015 Discharge date: 04/15/2015  Time spent: 25 minutes   Recommendations for Outpatient Follow-up:  1. Patient will follow-up next week with Dr. Bridgett Larsson, vascular surgery, for planned bypass surgery. Their office will call him to schedule. 2. New medication: Percocet 5/325 one by mouth every 3 hours as needed for pain 3. Nicotine patch 21 mg topically daily  Discharge Diagnoses:  Active Hospital Problems   Diagnosis Date Noted  . Diabetic foot infection ([place]) 04/02/2015  . Atherosclerosis of extremity with gangrene ([place])   . Hyperglycemia   . Preoperative cardiovascular examination 04/11/2015  . PVD (peripheral vascular disease) ([place]) 04/10/2015  . Absolute anemia   . Right foot pain 04/02/2015  . Neutrophilic leukocytosis 93/57/0177  . Diabetes mellitus with neurologic complication, without long-term current use of insulin ([place]) 07/20/2011  . Essential hypertension   . COPD (chronic obstructive pulmonary disease) [facility])     Resolved Hospital Problems   Diagnosis Date Noted Date Resolved  No resolved problems to display.    Discharge Condition: Stable, meaning discharge back to group home  Diet recommendation: Heart healthy carb modified   Filed Weights   04/02/15 0846  Weight: 82.555 kg (182 lb)    History of present illness:  Patient is a 61 year old male with past medical history of schizophrenia and diabetes mellitus with secondary diabetic foot infection of the right foot with poor healing admitted on 2/8 for that wound.   Hospital Course:  Principal Problem:   Diabetic foot infection ([place]) with course complicated by peripheral vascular disease and noted atherosclerosis of extremity with gangrene: Osteomyelitis able to be ruled out and patient did complete a full antibiotic course. He was seen by vascular surgery who felt that a toe amputation would not be  enough for the patient nor with a BKA/AKA which eventually may lead to a dissection up further at the hip, a very poor outcome for this patient.  What was recommended was a ABF with R fem-tib bypass as well as an amputation of the fifth toe and possibly fourth toe. This would give the patient best outcome for mortality.  However, after surgery on limited staffing this week and so they will not be able to do surgery until the week of 2/27. Since patient is stable, he will be discharged back to his group home with follow-up next week.  Patient discharged on medication for pain plus continued on statin. Active Problems:   Essential hypertension: Blood pressure stable during his hospitalization   COPD (chronic obstructive pulmonary disease) ([place])   Diabetes mellitus with neurologic complication, without long-term current use of insulin ([place]): Patient's blood sugars remained stable during this hospitalization  : Schizophrenia: Paranoid type. Patient seen by psychiatry on 2/20, and felt that patient's issues were stable and that he was competent to make his own decisions.  Consultants:  Vascular surgery  Orthopedic surgery  Psychiatry  Procedures:  None  Discharge Exam: BP 133/62 mmHg  Pulse 77  Temp(Src) 98.1 F (36.7 C) (Oral)  Resp 16  Ht 6' (1.829 m)  Wt 82.555 kg (182 lb)  BMI 25.40 kg/m2  SpO2 93%  General: Alert and oriented 3, no acute distress  Cardiovascular: Regular rate and rhythm, L3-J0, 2/6 systolic ejection murmur  Respiratory: Clear to auscultation bilaterally   Discharge Instructions You were cared for by a hospitalist during your hospital stay. If you have any questions about your discharge medications or  the care you received while you were in the hospital after you are discharged, you can call the unit and asked to speak with the hospitalist on call if the hospitalist that took care of you is not available. Once you are discharged, your primary care physician will  handle any further medical issues. Please note that NO REFILLS for any discharge medications will be authorized once you are discharged, as it is imperative that you return to your primary care physician (or establish a relationship with a primary care physician if you do not have one) for your aftercare needs so that they can reassess your need for medications and monitor your lab values.  Discharge Instructions    Diet - low sodium heart healthy    Complete by:  As directed      Increase activity slowly    Complete by:  As directed             Medication List    TAKE these medications        acetaminophen 325 MG tablet  Commonly known as:  TYLENOL  Take 650 mg by mouth every 6 (six) hours as needed for mild pain, fever or headache.     albuterol 108 (90 Base) MCG/ACT inhaler  Commonly known as:  PROVENTIL HFA;VENTOLIN HFA  Inhale 2 puffs into the lungs every 4 (four) hours as needed for wheezing or shortness of breath.     albuterol (2.5 MG/3ML) 0.083% nebulizer solution  Commonly known as:  PROVENTIL  Take 3 mLs (2.5 mg total) by nebulization every 4 (four) hours as needed for wheezing.     cloZAPine 100 MG tablet  Commonly known as:  CLOZARIL  Take 100-400 mg by mouth 3 (three) times daily. Takes 1 tablet every morning and afternoon Takes 4 tablets at bedtime     dicyclomine 20 MG tablet  Commonly known as:  BENTYL  Take 20 mg by mouth 2 (two) times daily as needed for spasms.     donepezil 5 MG tablet  Commonly known as:  ARICEPT  Take 5 mg by mouth at bedtime.     gabapentin 300 MG capsule  Commonly known as:  NEURONTIN  Take 300 mg by mouth 3 (three) times daily.     guaifenesin 100 MG/5ML syrup  Commonly known as:  ROBITUSSIN  Take 200 mg by mouth 4 (four) times daily as needed for cough.     haloperidol 10 MG tablet  Commonly known as:  HALDOL  Take 10 mg by mouth at bedtime.     hydrochlorothiazide 25 MG tablet  Commonly known as:  HYDRODIURIL  Take 25  mg by mouth daily.     ipratropium-albuterol 0.5-2.5 (3) MG/3ML Soln  Commonly known as:  DUONEB  Take 3 mLs by nebulization 3 (three) times daily.     lactulose 10 GM/15ML solution  Commonly known as:  CHRONULAC  Take 30 g by mouth 2 (two) times daily.     lisinopril 20 MG tablet  Commonly known as:  PRINIVIL,ZESTRIL  Take 20 mg by mouth daily.     metFORMIN 500 MG tablet  Commonly known as:  GLUCOPHAGE  Take 500 mg by mouth 2 (two) times daily with a meal.     metoprolol tartrate 25 MG tablet  Commonly known as:  LOPRESSOR  Take 25 mg by mouth 2 (two) times daily.     multivitamin with minerals Tabs tablet  Take 1 tablet by mouth daily.  nicotine 21 mg/24hr patch  Commonly known as:  NICODERM CQ - dosed in mg/24 hours  Place 1 patch (21 mg total) onto the skin daily.     oxyCODONE-acetaminophen 5-325 MG tablet  Commonly known as:  PERCOCET/ROXICET  Take 1 tablet by mouth every 3 (three) hours as needed for moderate pain.     pantoprazole 40 MG tablet  Commonly known as:  PROTONIX  Take 1 tablet (40 mg total) by mouth daily.     polyethylene glycol packet  Commonly known as:  MIRALAX / GLYCOLAX  Take 17 g by mouth daily as needed for mild constipation or moderate constipation.     senna 8.6 MG Tabs tablet  Commonly known as:  SENOKOT  Take 1 tablet by mouth 2 (two) times daily.     simvastatin 5 MG tablet  Commonly known as:  ZOCOR  Take 5 mg by mouth at bedtime.     trihexyphenidyl 5 MG tablet  Commonly known as:  ARTANE  Take 5 mg by mouth 2 (two) times daily with a meal.        No Known Allergies     Follow-up Information    Follow up with [facility].   Specialty:  [place] information:   [address] [place] [phone]      Follow up with Adele Barthel, MD.   Specialties:  Vascular Surgery, Cardiology   Why:  Office will call to set up next week's surgery   Contact  information:   [place] [address] [phone]        The results of significant diagnostics from this hospitalization (including imaging, microbiology, ancillary and laboratory) are listed below for reference.    Significant Diagnostic Studies: Ct Angio Ao+bifem W/cm &/or Wo/cm  04/12/2015  CLINICAL DATA:  Gangrenous left foot. EXAM: CT ANGIOGRAPHY AOBIFEM WITHOUT AND WITH CONTRAST TECHNIQUE: Arterial phase CT images of the abdomen, pelvis and complete bilateral lower extremities were obtained, with multi plantar maximum intensity projection reformats generated. CONTRAST:  122m OMNIPAQUE IOHEXOL 350 MG/ML SOLN COMPARISON:  09/25/2012 CT abdomen/ pelvis. FINDINGS: Lower chest: There are multiple slightly irregular thin-walled cystic structures at both lung bases, which have slightly increased in size since 09/25/2012. Mild circumferential wall thickening in the lower thoracic esophagus. Hepatobiliary: Normal liver with no liver mass. Normal gallbladder with no radiopaque cholelithiasis. No biliary ductal dilatation. Pancreas: Normal, with no mass or duct dilation. Spleen: Normal size. No mass. Adrenals/Urinary Tract: There is stable irregular nodular thickening of both adrenal glands measuring up to 15 mm thickness in the left adrenal gland, in keeping with nodular adrenal hyperplasia versus multiple adenomas. No hydronephrosis. No contour deforming renal mass. Nonspecific diffuse bladder wall thickening. Stomach/Bowel: Grossly normal stomach. Normal caliber small bowel with no small bowel wall thickening. Normal appendix. No large bowel wall thickening or pericolonic fat stranding. No bowel pneumatosis. Vascular/Lymphatic: Abdominal Aorta: Atherosclerotic nonaneurysmal abdominal aorta. Celiac Artery: Patent. SMA: Patent. IMA: High-grade atherosclerotic stenosis at the origin, approximately 70%. Renal Arteries: Two renal arteries bilaterally, all patent. Patent renal veins. No  pathologically enlarged lymph nodes in the abdomen or pelvis. Reproductive: Stable top-normal size prostate with nonspecific internal prostatic calcification. Other: No pneumoperitoneum, ascites or focal fluid collection. Musculoskeletal: No aggressive appearing focal osseous lesions. Stable chronic mild L4 vertebral compression fracture. Mild-to-moderate degenerative changes in the visualized thoracolumbar spine. RIGHT Lower Extremity: Common Iliac Artery: Marked atherosclerosis, with no high-grade stenosis. External  Iliac Artery: Occlusion just distal to the right common iliac artery bifurcation, new since 09/25/2012 Internal Iliac Artery: Marked atherosclerosis, with no high-grade stenosis. Common Femoral Artery: Reconstituted proximally by a right abdominal muscle wall collaterals, no high-grade stenosis. Superficial Femoral Artery: Occluded proximally just beyond the right common femoral artery bifurcation, with distal reconstitution via profunda collaterals. Deep Femoral Artery: Atherosclerotic and patent, with no high-grade stenosis. Popliteal Artery: Occluded distally. Tibioperonal Trunk: Reconstituted proximally by periarticular collaterals, atherosclerotic with no high-grade stenosis. Anterior Tibial Artery: Reconstituted proximal and by periarticular collaterals, atherosclerotic without appreciable high-grade stenosis. Posterior Tibial Artery: Atherosclerotic and patent with runoff to the posterior foot arterial arcade. Peroneal Artery: Occluded distally just above the ankle. Dorsalis Pedis Artery: Patent with runoff to the anterior foot arterial arcade. LEFT Lower Extremity: Common Iliac Artery: Marked atherosclerosis, with no high-grade stenosis. External Iliac Artery: Proximal occlusion just distal to the left common iliac artery bifurcation, which is chronic and unchanged since 09/25/2012. Internal Iliac Artery: Marked atherosclerosis, with no high-grade stenosis. Common Femoral Artery: Occluded.  Superficial Femoral Artery: Reconstituted proximally by collaterals, atherosclerotic without high-grade stenosis. Deep Femoral Artery: Patent. Popliteal Artery: Atherosclerotic and patent, with no high-grade stenosis. Tibioperonal Trunk: Atherosclerotic and patent with no high-grade stenosis. Anterior Tibial Artery: Atherosclerotic and patent with no high-grade stenosis. Posterior Tibial Artery: Atherosclerotic and patent with runoff to the posterior left foot arterial arcade. Peroneal Artery: Occluded proximally. Dorsalis Pedis Artery: Patent with runoff to the anterior foot arterial arcade. Review of the MIP images confirms the above findings. IMPRESSION: 1. 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 Two-vessel runoff to the left foot, fed predominantly by left internal iliac and left abdominopelvic muscle wall collaterals. 2. Right external iliac artery occlusion, new since 09/25/2012. Proximal right superficial femoral artery occlusion. Distal right popliteal artery occlusion. Two-vessel runoff to the right foot, fed predominantly by profunda collaterals. 3. No acute abnormality in the abdomen or pelvis. Nonspecific diffuse bladder wall thickening, probably chronic, correlate with urinalysis to exclude acute urinary tract infection. 4. Mild progression of thin-walled cysts at both lung bases, which could indicate a nonspecific cystic interstitial lung disease. Correlate with high-resolution chest CT on a short term outpatient basis as clinically warranted. Electronically Signed   By: Ilona Sorrel M.D.   On: 04/12/2015 16:45   Mr Foot Right W Wo Contrast  04/02/2015  CLINICAL DATA:  Diabetic foot infection.  Fifth digit is black. EXAM: MRI OF THE RIGHT FOREFOOT WITHOUT AND WITH CONTRAST TECHNIQUE: Multiplanar, multisequence MR imaging was performed both before and after administration of intravenous contrast. CONTRAST:  20m MULTIHANCE GADOBENATE DIMEGLUMINE 529 MG/ML IV SOLN  COMPARISON:  None. FINDINGS: Minimal marrow edema in the first distal phalanx without cortical destruction. No other marrow signal abnormality. No acute fracture dislocation. No bone destruction or periosteal reaction. Mild osteoarthritis of the first MTP joint. There is no soft tissue fluid collection or hematoma. There is mild generalized soft tissue edema along the dorsal aspect of the midfoot with mild enhancement which may reflect reactive edema versus mild cellulitis. The muscles are normal. The visualized flexor and extensor tendons are intact. IMPRESSION: 1. No evidence of osteomyelitis of the right foot. Electronically Signed   By: HKathreen Devoid  On: 04/02/2015 18:44   Nm Myocar Multi W/spect W/wall Motion / Ef  04/12/2015  CLINICAL DATA:  [age]year old male with right foot gangrene. Cardiac clearance prior to surgery. EXAM: MYOCARDIAL IMAGING WITH SPECT (REST AND PHARMACOLOGIC-STRESS) GATED LEFT VENTRICULAR WALL MOTION STUDY  LEFT VENTRICULAR EJECTION FRACTION TECHNIQUE: Standard myocardial SPECT imaging was performed after resting intravenous injection of 10 mCi Tc-64msestamibi. Subsequently, intravenous infusion of Lexiscan was performed under the supervision of the Cardiology staff. At peak effect of the drug, 30 mCi Tc-973mestamibi was injected intravenously and standard myocardial SPECT imaging was performed. Quantitative gated imaging was also performed to evaluate left ventricular wall motion, and estimate left ventricular ejection fraction. COMPARISON:  CT scan of the chest 02/20/2015 FINDINGS: Perfusion: No decreased activity in the left ventricle on stress imaging to suggest reversible ischemia or infarction. Wall Motion: Normal left ventricular wall motion. No left ventricular dilation. Left Ventricular Ejection Fraction: 56 % End diastolic volume 92 ml End systolic volume 40 ml IMPRESSION: 1. No reversible ischemia or infarction. 2. Normal left ventricular wall motion. 3. Left ventricular  ejection fraction 56% 4. Low-risk stress test findings*. *2012 Appropriate Use Criteria for Coronary Revascularization Focused Update: J Am Coll Cardiol. 202706;23(7):[id number]://content.[email or web address]?articleid=[email or web address] Electronically Signed   By: HeJacqulynn Cadet.D.   On: 04/12/2015 13:08   Dg Foot Complete Right  04/02/2015  CLINICAL DATA:  Right pinky toe is black, with dorsal soft tissue swelling and erythema along the foot. Initial encounter. EXAM: RIGHT FOOT COMPLETE - 3+ VIEW COMPARISON:  None. FINDINGS: There is no evidence of fracture or dislocation. No definite osseous erosion is characterized, though evaluation for osteomyelitis is limited on radiograph. The joint spaces are preserved. There is no evidence of talar subluxation; the subtalar joint is unremarkable in appearance. An os peroneum is seen. Mild dorsal soft tissue swelling is noted. IMPRESSION: 1. No evidence of fracture or dislocation. No definite osseous erosion seen, though evaluation for osteomyelitis is limited on radiograph. 2. Os peroneum seen. Electronically Signed   By: JeGarald Balding.D.   On: 04/02/2015 06:47    Microbiology: Recent Results (from the past 240 hour(s))  MRSA PCR Screening     Status: None   Collection Time: 04/11/15  5:42 AM  Result Value Ref Range Status   MRSA by PCR NEGATIVE NEGATIVE Final    Comment:        The GeneXpert MRSA Assay (FDA approved for NASAL specimens only), is one component of a comprehensive MRSA colonization surveillance program. It is not intended to diagnose MRSA infection nor to guide or monitor treatment for MRSA infections.   Surgical pcr screen     Status: None   Collection Time: 04/14/15  6:00 AM  Result Value Ref Range Status   MRSA, PCR NEGATIVE NEGATIVE Final   Staphylococcus aureus NEGATIVE NEGATIVE Final    Comment:        The Xpert SA Assay (FDA approved for NASAL specimens in patients over 2121ears of age), is one component of a  comprehensive surveillance program.  Test performance has been validated by [facility] patients greater than or equal to 1 12ear old. It is not intended to diagnose infection nor to guide or monitor treatment.      Labs: Basic Metabolic Panel:  Recent Labs Lab 04/09/15 0440 04/10/15 0105 04/11/15 0418 04/12/15 0509  NA 141 139 140 140  K 4.3 4.2 4.1 4.0  CL 104 102 103 104  CO2 '28 28 27 29  '$ GLUCOSE 147* 159* 138* 119*  BUN '14 15 14 12  '$ CREATININE 0.76 0.83 0.74 0.71  CALCIUM 9.3 9.4 9.2 9.2   Liver Function Tests: No results for input(s): AST, ALT, ALKPHOS, BILITOT, PROT, ALBUMIN in the last 168 hours. No results  for input(s): LIPASE, AMYLASE in the last 168 hours. No results for input(s): AMMONIA in the last 168 hours. CBC:  Recent Labs Lab 04/09/15 0440 04/10/15 0105 04/11/15 0418 04/12/15 0509  WBC 10.4 11.9* 9.7 8.4  NEUTROABS 7.6  --  7.0  --   HGB 10.6* 10.6* 10.7* 11.4*  HCT 35.2* 34.2* 34.7* 38.1*  MCV 90.7 89.5 89.4 90.9  PLT 329 294 336 373   Cardiac Enzymes: No results for input(s): CKTOTAL, CKMB, CKMBINDEX, TROPONINI in the last 168 hours. BNP: BNP (last 3 results)  Recent Labs  02/13/15 1752  BNP 5.9    ProBNP (last 3 results) No results for input(s): PROBNP in the last 8760 hours.  CBG:  Recent Labs Lab 04/14/15 0727 04/14/15 1124 04/14/15 1636 04/14/15 2117 04/15/15 0821  GLUCAP 137* 121* 145* 122* 119*       Signed:  KRISHNAN,SENDIL K  [facility] 04/15/2015, 11:07 AM

## 2015-04-15 NOTE — Progress Notes (Signed)
CSW spoke with facility Representative Wells Guiles regarding patient's return on today. Per Wells Guiles, patient can return to facility today at anytime. FL2 and discharge summary to be sent via [place] system. MD informed. CSW will arrange transportation once ready.   Lucius Conn, [place] Worker [facility] Ph: [phone]

## 2015-04-15 NOTE — Progress Notes (Signed)
   Progress Note  As I discussed with the hospitalist, due to multiple surgeons being on vacation, there is no further capacity this week to address this patient's PAD issues.  Will have to make arrangement for next week.  From my viewpoint, this patient can be discharge for elective return for scheduled operation.  - my office will contact the patient with details.  Adele Barthel, MD [facility] Office: [phone] Pager: [phone]  04/15/2015, 7:29 AM

## 2015-04-15 NOTE — Progress Notes (Signed)
   Daily Progress Note  Pt is scheduled for: Aortobifemoral bypass, right common femoral artery to posterior tibial artery bypass, right 5th toe amputation, possible right 4th toe amputation for this coming Wednesday, Jun 23, 2015.  - He will be contacted with details for that case from my office at [place], MD [facility] [place] Office: [phone] Pager: [phone]  04/15/2015, 11:34 AM

## 2015-04-15 NOTE — NC FL2 (Signed)
[place] LEVEL OF CARE SCREENING TOOL     IDENTIFICATION  Patient Name: Craig Bentley Birthdate: 1955-11-30 Sex: male Admission Date (Current Location): 04/02/2015  [place] and [place] Number:  [name] [id number] [place] and Address:  [facility], [place] [address], [place], [place] [address]      Provider Number: [id number]  Attending Physician Name and Address:  Annita Brod, MD  Relative Name and Phone Number:       Current Level of Care: Hospital Recommended Level of Care: [place] Prior Approval Number:    Date Approved/Denied:   PASRR Number:    Discharge Plan: Other (Comment) ([facility])    Current Diagnoses: Patient Active Problem List   Diagnosis Date Noted  . Atherosclerosis of extremity with gangrene ([place])   . Hyperglycemia   . Preoperative cardiovascular examination 04/11/2015  . PVD (peripheral vascular disease) ([place]) 04/10/2015  . Absolute anemia   . Diabetic foot infection ([place]) 04/02/2015  . Right foot pain 04/02/2015  . Neutrophilic leukocytosis 70/35/0093  . Esophageal abnormality   . HCAP (healthcare-associated pneumonia)   . COPD exacerbation ([place])   . Dyspnea   . Respiratory distress   . Sepsis due to pneumonia ([place]) 02/14/2015  . Acute respiratory failure with hypoxia ([place]) 02/13/2015  . Diabetes mellitus with neurologic complication, without long-term current use of insulin ([place]) 07/20/2011  . Essential hypertension   . COPD (chronic obstructive pulmonary disease) ([facility])     Orientation RESPIRATION BLADDER Height & Weight     Self, Time, Situation, Place  Normal Continent Weight: 182 lb (82.555 kg) Height:  6' (182.9 cm)  BEHAVIORAL SYMPTOMS/MOOD NEUROLOGICAL BOWEL NUTRITION STATUS   (NONE)  (NONE) Continent Diet (Carb Modified)  AMBULATORY STATUS COMMUNICATION OF NEEDS Skin   Limited Assist Verbally  (Wound)                       Personal Care Assistance Level of  Assistance  Bathing, Feeding, Dressing Bathing Assistance: Limited assistance Feeding assistance: Independent Dressing Assistance: Limited assistance     Functional Limitations Info  Sight, Hearing, Speech Sight Info: Adequate Hearing Info: Adequate Speech Info: Adequate    SPECIAL CARE FACTORS FREQUENCY  PT (By licensed PT)                    Contractures Contractures Info: Not present    Additional Factors Info  Code Status, Allergies Code Status Info: FULL  Allergies Info: No Known Allergies  Psychotropic Info: Haldol Insulin Sliding Scale Info: 3/day       Current Medications (04/15/2015):  This is the current hospital active medication list Current Facility-Administered Medications  Medication Dose Route Frequency Provider Last Rate Last Dose  . acetaminophen (TYLENOL) tablet 650 mg  650 mg Oral Q6H PRN Bonnielee Haff, MD   650 mg at 04/05/15 1501   Or  . acetaminophen (TYLENOL) suppository 650 mg  650 mg Rectal Q6H PRN Bonnielee Haff, MD      . aspirin EC tablet 81 mg  81 mg Oral Daily Reyne Dumas, MD   81 mg at 04/15/15 0955  . atorvastatin (LIPITOR) tablet 40 mg  40 mg Oral q1800 Brett Canales, PA-C   40 mg at 04/14/15 1801  . ceFAZolin (ANCEF) IVPB 1 g/50 mL premix  1 g Intravenous 3 times per day Lavina Hamman, MD   1 g at 04/15/15 0548  . cloZAPine (CLOZARIL) tablet 100 mg  100 mg Oral BID Bonnielee Haff, MD   100 mg at 04/14/15 1700  . cloZAPine (CLOZARIL) tablet 400 mg  400 mg Oral QHS Bonnielee Haff, MD   400 mg at 04/14/15 2236  . dicyclomine (BENTYL) tablet 20 mg  20 mg Oral BID PRN Bonnielee Haff, MD   20 mg at 04/12/15 2303  . donepezil (ARICEPT) tablet 5 mg  5 mg Oral QHS Bonnielee Haff, MD   5 mg at 04/14/15 2238  . enoxaparin (LOVENOX) injection 40 mg  40 mg Subcutaneous Q24H Bonnielee Haff, MD   40 mg at 04/14/15 1801  . feeding supplement (GLUCERNA SHAKE) (GLUCERNA SHAKE) liquid 237 mL  237 mL Oral BID BM Bonnielee Haff, MD   237 mL at 04/15/15  1000  . gabapentin (NEURONTIN) capsule 300 mg  300 mg Oral TID Bonnielee Haff, MD   300 mg at 04/15/15 0955  . haloperidol (HALDOL) tablet 10 mg  10 mg Oral QHS Bonnielee Haff, MD   10 mg at 04/14/15 2238  . insulin aspart (novoLOG) injection 0-15 Units  0-15 Units Subcutaneous TID WC Bonnielee Haff, MD   2 Units at 04/14/15 1700  . insulin aspart (novoLOG) injection 3 Units  3 Units Subcutaneous QAC lunch Janece Canterbury, MD   3 Units at 04/14/15 1256  . insulin glargine (LANTUS) injection 10 Units  10 Units Subcutaneous Daily Reyne Dumas, MD   10 Units at 04/14/15 2240  . ipratropium-albuterol (DUONEB) 0.5-2.5 (3) MG/3ML nebulizer solution 3 mL  3 mL Nebulization Q6H PRN Bonnielee Haff, MD      . lactulose (CHRONULAC) 10 GM/15ML solution 30 g  30 g Oral BID Bonnielee Haff, MD   30 g at 04/15/15 0955  . metoprolol tartrate (LOPRESSOR) tablet 25 mg  25 mg Oral BID Bonnielee Haff, MD   25 mg at 04/15/15 0956  . oxyCODONE-acetaminophen (PERCOCET/ROXICET) 5-325 MG per tablet 1-2 tablet  1-2 tablet Oral Q3H PRN Angelia Mould, MD   2 tablet at 04/15/15 [phone]  . pantoprazole (PROTONIX) EC tablet 40 mg  40 mg Oral Daily Bonnielee Haff, MD   40 mg at 04/15/15 0957  . polyethylene glycol (MIRALAX / GLYCOLAX) packet 17 g  17 g Oral Daily PRN Bonnielee Haff, MD      . senna (SENOKOT) tablet 8.6 mg  1 tablet Oral BID Bonnielee Haff, MD   8.6 mg at 04/15/15 0956  . trihexyphenidyl (ARTANE) tablet 5 mg  5 mg Oral BID WC Bonnielee Haff, MD   5 mg at 04/14/15 1700     Discharge Medications: Please see discharge summary for a list of discharge medications.  Relevant Imaging Results:  Relevant Lab Results:   Additional Information SSN: [id number]  Raymondo Band, LCSW

## 2015-04-15 NOTE — Progress Notes (Signed)
Report called to [facility]

## 2015-04-16 ENCOUNTER — Other Ambulatory Visit: Payer: Self-pay

## 2015-04-17 NOTE — Pre-Procedure Instructions (Signed)
Craig Bentley  04/17/2015     Your procedure is scheduled on Wednesday, March 1.  Report to [facility] Admitting at 6:30 A.M.               Your surgery or procedure is scheduled for 8:30 AM   Call this number if you have problems the morning of surgery   [phone]                     For any other questions, please call [phone], Monday - Friday 8 AM - 4 PM.   Remember:  Do not eat food or drink liquids after midnight Tuesday, February 28.  Take these medicines the morning of surgery with A SIP OF WATER :gabapentin (NEURONTIN), metoprolol tartrate (LOPRESSOR),  pantoprazole (PROTONIX), cloZAPine (CLOZARIL).              May use inhalers.               Stop taking Aspirin, Coumadin, Plavix, Effient and Herbal medications.  Do not take any NSAIDs ie: Ibuprofen,  Advil,Naproxen or any medication containing Aspirin.                           How to Manage Your Diabetes Before Surgery  What do I do about my diabetes medications?   Do not take oral diabetes medicines (pills) the morning of surgery.  Why is it important to control my blood sugar before and after surgery?   Improving blood sugar levels before and after surgery helps healing and can limit problems.  A way of improving blood sugar control is eating a healthy diet by:  - Eating less sugar and carbohydrates  - Increasing activity/exercise  - Talk with your doctor about reaching your blood sugar goals  High blood sugars (greater than 180 mg/dL) can raise your risk of infections and slow down your recovery so you will need to focus on controlling your diabetes during the weeks before surgery.  Make sure that the doctor who takes care of your diabetes knows about your planned surgery including the date and location.  How do I manage my blood sugars before surgery?   Check your blood sugar at least 4 times a day, 2 days before surgery to make sure that they are not too high or low.   Check your blood  sugar the morning of your surgery when you wake up and every 2 hours until you get to the Short-Stay unit.  If your blood sugar is less than 70 mg/dL, you will need to treat for low blood sugar by:  Treat a low blood sugar (less than 70 mg/dL) with 1/2 cup of clear juice (cranberry or apple), 4 glucose tablets, OR glucose gel.  Recheck blood sugar in 15 minutes after treatment (to make sure it is greater than 70 mg/dL).  If blood sugar is not greater than 70 mg/dL on re-check, call [phone] for further instructions.   Report your blood sugar to the Short-Stay nurse when you get to Short-Stay.  References:  [facility], 2007 "How to Manage your Diabetes Before and After Surgery".   Do not wear jewelry, make-up or nail polish.  Do not wear lotions, powders, or perfumes.    Men may shave face and neck.  Do not bring valuables to the hospital.  [facility] is not responsible for any belongings or valuables.  Contacts, dentures  or bridgework may not be worn into surgery.  Leave your suitcase in the car.  After surgery it may be brought to your room.  For patients admitted to the hospital, discharge time will be determined by your treatment team.  Patients discharged the day of surgery will not be allowed to drive home.   Name and phone number of your driver:   -  Special instructions:  Review  [facility] - Preparing For Surgery.  Please read over the following fact sheets that you were given. Pain Booklet, Coughing and Deep Breathing, Blood Transfusion Information, MRSA Information and Surgical Site Infection Prevention

## 2015-04-18 ENCOUNTER — Encounter (HOSPITAL_COMMUNITY): Payer: Self-pay

## 2015-04-18 ENCOUNTER — Encounter (HOSPITAL_COMMUNITY)
Admission: RE | Admit: 2015-04-18 | Discharge: 2015-04-18 | Disposition: A | Discharge status: Home / Self Care | Payer: Medicaid Other | Source: Ambulatory Visit | Attending: Vascular Surgery | Admitting: Vascular Surgery

## 2015-04-18 DIAGNOSIS — J449 Chronic obstructive pulmonary disease, unspecified: Secondary | ICD-10-CM | POA: Insufficient documentation

## 2015-04-18 DIAGNOSIS — Z7984 Long term (current) use of oral hypoglycemic drugs: Secondary | ICD-10-CM | POA: Insufficient documentation

## 2015-04-18 DIAGNOSIS — F419 Anxiety disorder, unspecified: Secondary | ICD-10-CM | POA: Insufficient documentation

## 2015-04-18 DIAGNOSIS — Z79899 Other long term (current) drug therapy: Secondary | ICD-10-CM | POA: Diagnosis not present

## 2015-04-18 DIAGNOSIS — Z01818 Encounter for other preprocedural examination: Secondary | ICD-10-CM | POA: Diagnosis not present

## 2015-04-18 DIAGNOSIS — I739 Peripheral vascular disease, unspecified: Secondary | ICD-10-CM | POA: Diagnosis not present

## 2015-04-18 DIAGNOSIS — F17200 Nicotine dependence, unspecified, uncomplicated: Secondary | ICD-10-CM | POA: Diagnosis not present

## 2015-04-18 DIAGNOSIS — Z01812 Encounter for preprocedural laboratory examination: Secondary | ICD-10-CM | POA: Diagnosis not present

## 2015-04-18 DIAGNOSIS — Z0183 Encounter for blood typing: Secondary | ICD-10-CM | POA: Diagnosis not present

## 2015-04-18 DIAGNOSIS — F209 Schizophrenia, unspecified: Secondary | ICD-10-CM | POA: Diagnosis not present

## 2015-04-18 DIAGNOSIS — E119 Type 2 diabetes mellitus without complications: Secondary | ICD-10-CM | POA: Insufficient documentation

## 2015-04-18 DIAGNOSIS — I10 Essential (primary) hypertension: Secondary | ICD-10-CM | POA: Diagnosis not present

## 2015-04-18 HISTORY — DX: Anxiety disorder, unspecified: F41.9

## 2015-04-18 HISTORY — DX: Pneumonia, unspecified organism: J18.9

## 2015-04-18 LAB — COMPREHENSIVE METABOLIC PANEL
ALT: 21 U/L (ref 17–63)
AST: 21 U/L (ref 15–41)
Albumin: 3.7 g/dL (ref 3.5–5.0)
Alkaline Phosphatase: 58 U/L (ref 38–126)
Anion gap: 12 (ref 5–15)
BUN: 13 mg/dL (ref 6–20)
CHLORIDE: 101 mmol/L (ref 101–111)
CO2: 24 mmol/L (ref 22–32)
Calcium: 9.8 mg/dL (ref 8.9–10.3)
Creatinine, Ser: 0.84 mg/dL (ref 0.61–1.24)
Glucose, Bld: 146 mg/dL — ABNORMAL HIGH (ref 65–99)
POTASSIUM: 3.8 mmol/L (ref 3.5–5.1)
Sodium: 137 mmol/L (ref 135–145)
TOTAL PROTEIN: 7.6 g/dL (ref 6.5–8.1)
Total Bilirubin: 0.2 mg/dL — ABNORMAL LOW (ref 0.3–1.2)

## 2015-04-18 LAB — CBC
HCT: 39.4 % (ref 39.0–52.0)
Hemoglobin: 12.2 g/dL — ABNORMAL LOW (ref 13.0–17.0)
MCH: 27.5 pg (ref 26.0–34.0)
MCHC: 31 g/dL (ref 30.0–36.0)
MCV: 88.7 fL (ref 78.0–100.0)
PLATELETS: 336 10*3/uL (ref 150–400)
RBC: 4.44 MIL/uL (ref 4.22–5.81)
RDW: 14 % (ref 11.5–15.5)
WBC: 10.6 10*3/uL — ABNORMAL HIGH (ref 4.0–10.5)

## 2015-04-18 LAB — BLOOD GAS, ARTERIAL
ACID-BASE EXCESS: 3 mmol/L — AB (ref 0.0–2.0)
Bicarbonate: 27.2 mEq/L — ABNORMAL HIGH (ref 20.0–24.0)
DRAWN BY: 206361
FIO2: 0.21
O2 Saturation: 95.6 %
PCO2 ART: 43.5 mmHg (ref 35.0–45.0)
PO2 ART: 73.9 mmHg — AB (ref 80.0–100.0)
Patient temperature: 98.6
TCO2: 28.5 mmol/L (ref 0–100)
pH, Arterial: 7.412 (ref 7.350–7.450)

## 2015-04-18 LAB — GLUCOSE, CAPILLARY: GLUCOSE-CAPILLARY: 147 mg/dL — AB (ref 65–99)

## 2015-04-18 LAB — APTT: APTT: 33 s (ref 24–37)

## 2015-04-18 LAB — PROTIME-INR
INR: 1.08 (ref 0.00–1.49)
Prothrombin Time: 14.2 seconds (ref 11.6–15.2)

## 2015-04-18 NOTE — Progress Notes (Signed)
Anesthesia Chart Review: Patient is a 60 year old male scheduled for AFBG, right Fem-PT BGP, right 4th and 5th toe amputations on 04/23/15 by Dr. Bridgett Larsson.  History includes smoking, PAD, HTN, Schizophrenia/Schizo-affective, COPD, DM2, anxiety. He was admitted 04/02/15-04/15/15 with a diabetic non-healing foot wound with underlying PVD. Vascular surgery, psychiatry, and cardiology (for pre-op evaluation) consulted during that admission.   Based on his anatomy, vascular surgery recommended the above procedure. He did not feel patient could heal toe amputations and possibly not even an AKA without re-vascularization. Procedure was given a 5-8% mortality rate with 15-30% morbidity, so psychiatry was consulted to assess him competency in weighing his treatment options.    Psychiatry determined that patient did meet criteria for capacity to make his own medical decisions and was willing to provide voluntarily consent for surgery.   Per 04/13/15 note by cardiologist Dr. Acie Fredrickson: 1. PAD: Pt may need aorto bifem surgery. The Lexiscan myoview was normal. At this point, there are no cardiac issues that would place him at additional risk. I have read Dr. Lianne Moris note from yesterday and recognize that the proposed surgery is inherently risky.   Patient lives at [facility]. PCP is Mabeline Caras, NP.   Meds include albuterol, Clozaril, Aricept, Bentyl, Haldol, HCTZ, lisinopril, metformin, Lopressor, Percocet, Protonix, Zocor, Artane.  02/13/15 EKG: SR with baseline wanderer and artifact.  04/12/15 Nuclear stress test: IMPRESSION: 1. No reversible ischemia or infarction. 2. Normal left ventricular wall motion. 3. Left ventricular ejection fraction 56% 4. Low-risk stress test findings*.  06/11/09 Echo: Study Conclusions Left ventricle: The cavity size was normal. Wall thickness was increased in a pattern of mild LVH. Systolic function was normal. The estimated ejection fraction was in the  range of 55% to 60%. Regional wall motion abnormalities cannot be excluded. Doppler parameters are consistent with abnormal left ventricular relaxation (grade 1 diastolic dysfunction). Impressions: - Technically difficult; cannot R/O source of embolus with this  study; suggest TEE if clinically indicated.  Preoperative labs noted. Cr 0.84. Glucose 146. H/H 12.2/39.4. PT/PTT WNL. T&S done. Patient could not void at PAT, so UA could not be done. A1c is pending.  If no acute changes then I anticipate that he can proceed as planned.  [facility] Short Stay Center/Anesthesiology Phone [phone] 04/18/2015 5:41 PM

## 2015-04-18 NOTE — Progress Notes (Addendum)
Pt lives at [facility]- PCP is Mabeline Caras, NP Dr Sharrell Ku is his cardiologist Reports his fasting cbg's run in the 100's Pt unable to void at this time instructed to bring in urine first thing on Wed morning.

## 2015-04-19 LAB — HEMOGLOBIN A1C
Hgb A1c MFr Bld: 6.4 % — ABNORMAL HIGH (ref 4.8–5.6)
MEAN PLASMA GLUCOSE: 137 mg/dL

## 2015-04-22 MED ORDER — SODIUM CHLORIDE 0.9 % IV SOLN: INTRAVENOUS | Status: DC

## 2015-04-22 MED ORDER — DEXTROSE 5 % IV SOLN
1.5000 g | INTRAVENOUS | Status: AC
  Administered 2015-04-23 (×3): 1.5 g via INTRAVENOUS
  Filled 2015-04-22: qty 1.5

## 2015-04-23 ENCOUNTER — Inpatient Hospital Stay (HOSPITAL_COMMUNITY): Payer: Medicaid Other | Admitting: Vascular Surgery

## 2015-04-23 ENCOUNTER — Inpatient Hospital Stay (HOSPITAL_COMMUNITY): Payer: Medicaid Other

## 2015-04-23 ENCOUNTER — Inpatient Hospital Stay (HOSPITAL_COMMUNITY)
Admission: RE | Admit: 2015-04-23 | Discharge: 2015-05-01 | DRG: 270 | Disposition: A | Discharge status: Home Health | Payer: Medicaid Other | Source: Ambulatory Visit | Attending: Vascular Surgery | Admitting: Vascular Surgery

## 2015-04-23 ENCOUNTER — Encounter (HOSPITAL_COMMUNITY): Payer: Self-pay | Admitting: *Deleted

## 2015-04-23 ENCOUNTER — Encounter (HOSPITAL_COMMUNITY)
Admission: RE | Disposition: A | Discharge status: Home Health | Payer: Self-pay | Source: Ambulatory Visit | Attending: Vascular Surgery

## 2015-04-23 ENCOUNTER — Inpatient Hospital Stay (HOSPITAL_COMMUNITY): Payer: Medicaid Other | Admitting: Certified Registered Nurse Anesthetist

## 2015-04-23 DIAGNOSIS — F419 Anxiety disorder, unspecified: Secondary | ICD-10-CM | POA: Diagnosis present

## 2015-04-23 DIAGNOSIS — J449 Chronic obstructive pulmonary disease, unspecified: Secondary | ICD-10-CM | POA: Diagnosis present

## 2015-04-23 DIAGNOSIS — G9341 Metabolic encephalopathy: Secondary | ICD-10-CM | POA: Diagnosis not present

## 2015-04-23 DIAGNOSIS — F209 Schizophrenia, unspecified: Secondary | ICD-10-CM | POA: Diagnosis not present

## 2015-04-23 DIAGNOSIS — F05 Delirium due to known physiological condition: Secondary | ICD-10-CM | POA: Diagnosis not present

## 2015-04-23 DIAGNOSIS — I70261 Atherosclerosis of native arteries of extremities with gangrene, right leg: Secondary | ICD-10-CM | POA: Diagnosis present

## 2015-04-23 DIAGNOSIS — R410 Disorientation, unspecified: Secondary | ICD-10-CM | POA: Diagnosis not present

## 2015-04-23 DIAGNOSIS — I10 Essential (primary) hypertension: Secondary | ICD-10-CM | POA: Diagnosis present

## 2015-04-23 DIAGNOSIS — E1149 Type 2 diabetes mellitus with other diabetic neurological complication: Secondary | ICD-10-CM | POA: Diagnosis present

## 2015-04-23 DIAGNOSIS — J9690 Respiratory failure, unspecified, unspecified whether with hypoxia or hypercapnia: Secondary | ICD-10-CM | POA: Insufficient documentation

## 2015-04-23 DIAGNOSIS — J9600 Acute respiratory failure, unspecified whether with hypoxia or hypercapnia: Secondary | ICD-10-CM | POA: Diagnosis not present

## 2015-04-23 DIAGNOSIS — K5900 Constipation, unspecified: Secondary | ICD-10-CM | POA: Diagnosis present

## 2015-04-23 DIAGNOSIS — Z419 Encounter for procedure for purposes other than remedying health state, unspecified: Secondary | ICD-10-CM | POA: Diagnosis not present

## 2015-04-23 DIAGNOSIS — E1152 Type 2 diabetes mellitus with diabetic peripheral angiopathy with gangrene: Secondary | ICD-10-CM | POA: Diagnosis present

## 2015-04-23 DIAGNOSIS — E8770 Fluid overload, unspecified: Secondary | ICD-10-CM | POA: Diagnosis not present

## 2015-04-23 DIAGNOSIS — J9601 Acute respiratory failure with hypoxia: Secondary | ICD-10-CM | POA: Diagnosis not present

## 2015-04-23 DIAGNOSIS — Z79899 Other long term (current) drug therapy: Secondary | ICD-10-CM

## 2015-04-23 DIAGNOSIS — F259 Schizoaffective disorder, unspecified: Secondary | ICD-10-CM | POA: Diagnosis present

## 2015-04-23 DIAGNOSIS — I739 Peripheral vascular disease, unspecified: Secondary | ICD-10-CM | POA: Diagnosis not present

## 2015-04-23 DIAGNOSIS — I70269 Atherosclerosis of native arteries of extremities with gangrene, unspecified extremity: Secondary | ICD-10-CM | POA: Diagnosis present

## 2015-04-23 DIAGNOSIS — F203 Undifferentiated schizophrenia: Secondary | ICD-10-CM | POA: Diagnosis not present

## 2015-04-23 DIAGNOSIS — E785 Hyperlipidemia, unspecified: Secondary | ICD-10-CM | POA: Diagnosis present

## 2015-04-23 DIAGNOSIS — I96 Gangrene, not elsewhere classified: Secondary | ICD-10-CM | POA: Diagnosis present

## 2015-04-23 DIAGNOSIS — R1110 Vomiting, unspecified: Secondary | ICD-10-CM

## 2015-04-23 DIAGNOSIS — I959 Hypotension, unspecified: Secondary | ICD-10-CM | POA: Diagnosis not present

## 2015-04-23 DIAGNOSIS — F17210 Nicotine dependence, cigarettes, uncomplicated: Secondary | ICD-10-CM | POA: Diagnosis present

## 2015-04-23 DIAGNOSIS — D62 Acute posthemorrhagic anemia: Secondary | ICD-10-CM | POA: Diagnosis not present

## 2015-04-23 DIAGNOSIS — L97519 Non-pressure chronic ulcer of other part of right foot with unspecified severity: Secondary | ICD-10-CM | POA: Diagnosis present

## 2015-04-23 DIAGNOSIS — E876 Hypokalemia: Secondary | ICD-10-CM | POA: Diagnosis not present

## 2015-04-23 DIAGNOSIS — Z818 Family history of other mental and behavioral disorders: Secondary | ICD-10-CM

## 2015-04-23 DIAGNOSIS — I708 Atherosclerosis of other arteries: Secondary | ICD-10-CM | POA: Diagnosis present

## 2015-04-23 DIAGNOSIS — K567 Ileus, unspecified: Secondary | ICD-10-CM | POA: Diagnosis not present

## 2015-04-23 DIAGNOSIS — R112 Nausea with vomiting, unspecified: Secondary | ICD-10-CM

## 2015-04-23 DIAGNOSIS — E0840 Diabetes mellitus due to underlying condition with diabetic neuropathy, unspecified: Secondary | ICD-10-CM | POA: Diagnosis not present

## 2015-04-23 DIAGNOSIS — K913 Postprocedural intestinal obstruction: Secondary | ICD-10-CM | POA: Diagnosis not present

## 2015-04-23 DIAGNOSIS — I7409 Other arterial embolism and thrombosis of abdominal aorta: Secondary | ICD-10-CM

## 2015-04-23 DIAGNOSIS — F200 Paranoid schizophrenia: Secondary | ICD-10-CM | POA: Diagnosis not present

## 2015-04-23 DIAGNOSIS — Z7984 Long term (current) use of oral hypoglycemic drugs: Secondary | ICD-10-CM

## 2015-04-23 DIAGNOSIS — I70223 Atherosclerosis of native arteries of extremities with rest pain, bilateral legs: Secondary | ICD-10-CM

## 2015-04-23 DIAGNOSIS — Z9889 Other specified postprocedural states: Secondary | ICD-10-CM | POA: Diagnosis not present

## 2015-04-23 HISTORY — PX: FEMORAL-FEMORAL BYPASS GRAFT: SHX936

## 2015-04-23 HISTORY — PX: FEMORAL-TIBIAL BYPASS GRAFT: SHX938

## 2015-04-23 HISTORY — PX: AMPUTATION: SHX166

## 2015-04-23 HISTORY — PX: AORTA - BILATERAL FEMORAL ARTERY BYPASS GRAFT: SHX1175

## 2015-04-23 LAB — POCT I-STAT 3, ART BLOOD GAS (G3+)
Acid-base deficit: 1 mmol/L (ref 0.0–2.0)
BICARBONATE: 24.9 meq/L — AB (ref 20.0–24.0)
O2 Saturation: 97 %
PCO2 ART: 41.4 mmHg (ref 35.0–45.0)
PH ART: 7.381 (ref 7.350–7.450)
TCO2: 26 mmol/L (ref 0–100)
pO2, Arterial: 89 mmHg (ref 80.0–100.0)

## 2015-04-23 LAB — GLUCOSE, CAPILLARY
GLUCOSE-CAPILLARY: 216 mg/dL — AB (ref 65–99)
Glucose-Capillary: 144 mg/dL — ABNORMAL HIGH (ref 65–99)

## 2015-04-23 LAB — BASIC METABOLIC PANEL
Anion gap: 12 (ref 5–15)
BUN: 8 mg/dL (ref 6–20)
CALCIUM: 8.1 mg/dL — AB (ref 8.9–10.3)
CO2: 24 mmol/L (ref 22–32)
CREATININE: 0.73 mg/dL (ref 0.61–1.24)
Chloride: 101 mmol/L (ref 101–111)
GFR calc non Af Amer: >60 mL/min (ref >60)
Glucose, Bld: 250 mg/dL — ABNORMAL HIGH (ref 65–99)
Potassium: 4 mmol/L (ref 3.5–5.1)
Sodium: 137 mmol/L (ref 135–145)

## 2015-04-23 LAB — MAGNESIUM: Magnesium: 1 mg/dL — ABNORMAL LOW (ref 1.7–2.4)

## 2015-04-23 LAB — PREPARE RBC (CROSSMATCH)

## 2015-04-23 LAB — PROTIME-INR
INR: 1.3 (ref 0.00–1.49)
Prothrombin Time: 16.3 seconds — ABNORMAL HIGH (ref 11.6–15.2)

## 2015-04-23 LAB — CBC
HEMATOCRIT: 30.6 % — AB (ref 39.0–52.0)
Hemoglobin: 9.5 g/dL — ABNORMAL LOW (ref 13.0–17.0)
MCH: 26.2 pg (ref 26.0–34.0)
MCHC: 31 g/dL (ref 30.0–36.0)
MCV: 84.5 fL (ref 78.0–100.0)
PLATELETS: 192 10*3/uL (ref 150–400)
RBC: 3.62 MIL/uL — ABNORMAL LOW (ref 4.22–5.81)
RDW: 13.8 % (ref 11.5–15.5)
WBC: 13.8 10*3/uL — ABNORMAL HIGH (ref 4.0–10.5)

## 2015-04-23 LAB — APTT: aPTT: 35 seconds (ref 24–37)

## 2015-04-23 LAB — TRIGLYCERIDES: TRIGLYCERIDES: 87 mg/dL (ref <150)

## 2015-04-23 LAB — TROPONIN I: Troponin I: <0.03 ng/mL (ref <0.031)

## 2015-04-23 SURGERY — CREATION, BYPASS, ARTERIAL, AORTA TO FEMORAL, BILATERAL, USING GRAFT
Anesthesia: General | Laterality: Right

## 2015-04-23 MED ORDER — FENTANYL CITRATE (PF) 250 MCG/5ML IJ SOLN
INTRAMUSCULAR | Status: AC
  Filled 2015-04-23: qty 5

## 2015-04-23 MED ORDER — SIMVASTATIN 10 MG PO TABS: 5.0000 mg | ORAL_TABLET | Freq: Every day | ORAL | Status: DC

## 2015-04-23 MED ORDER — HEPARIN SODIUM (PORCINE) 1000 UNIT/ML IJ SOLN
INTRAMUSCULAR | Status: DC | PRN
  Administered 2015-04-23 (×5): 2000 [IU] via INTRAVENOUS
  Administered 2015-04-23: 8000 [IU] via INTRAVENOUS

## 2015-04-23 MED ORDER — 0.9 % SODIUM CHLORIDE (POUR BTL) OPTIME
TOPICAL | Status: DC | PRN
  Administered 2015-04-23 (×3): 1000 mL

## 2015-04-23 MED ORDER — FENTANYL CITRATE (PF) 100 MCG/2ML IJ SOLN
INTRAMUSCULAR | Status: DC | PRN
  Administered 2015-04-23 (×4): 50 ug via INTRAVENOUS
  Administered 2015-04-23: 400 ug via INTRAVENOUS
  Administered 2015-04-23 (×3): 50 ug via INTRAVENOUS
  Administered 2015-04-23: 25 ug via INTRAVENOUS
  Administered 2015-04-23 (×2): 100 ug via INTRAVENOUS
  Administered 2015-04-23 (×3): 25 ug via INTRAVENOUS
  Administered 2015-04-23 (×2): 50 ug via INTRAVENOUS

## 2015-04-23 MED ORDER — POTASSIUM CHLORIDE CRYS ER 20 MEQ PO TBCR
20.0000 meq | EXTENDED_RELEASE_TABLET | Freq: Every day | ORAL | Status: DC | PRN
Start: 2015-04-23 — End: 2015-04-29

## 2015-04-23 MED ORDER — ENOXAPARIN SODIUM 40 MG/0.4ML ~~LOC~~ SOLN
40.0000 mg | SUBCUTANEOUS | Status: DC
  Administered 2015-04-24 – 2015-05-01 (×8): 40 mg via SUBCUTANEOUS
  Filled 2015-04-23 (×8): qty 0.4

## 2015-04-23 MED ORDER — ROCURONIUM BROMIDE 50 MG/5ML IV SOLN
INTRAVENOUS | Status: AC
  Filled 2015-04-23: qty 3

## 2015-04-23 MED ORDER — SODIUM CHLORIDE 0.9 % IJ SOLN
INTRAMUSCULAR | Status: AC
  Filled 2015-04-23: qty 10

## 2015-04-23 MED ORDER — MANNITOL 25 % IV SOLN
25.0000 g | Freq: Once | INTRAVENOUS | Status: AC
Start: 2015-04-23 — End: 2015-04-23
  Administered 2015-04-23: 12.5 g via INTRAVENOUS
  Filled 2015-04-23: qty 100

## 2015-04-23 MED ORDER — ANTISEPTIC ORAL RINSE SOLUTION (CORINZ)
7.0000 mL | Freq: Four times a day (QID) | OROMUCOSAL | Status: DC
  Administered 2015-04-23 – 2015-04-26 (×11): 7 mL via OROMUCOSAL

## 2015-04-23 MED ORDER — LACTATED RINGERS IV SOLN
INTRAVENOUS | Status: DC | PRN
  Administered 2015-04-23 (×3): via INTRAVENOUS

## 2015-04-23 MED ORDER — LACTATED RINGERS IV SOLN
INTRAVENOUS | Status: DC | PRN
  Administered 2015-04-23: 09:00:00 via INTRAVENOUS

## 2015-04-23 MED ORDER — PANTOPRAZOLE SODIUM 40 MG IV SOLR
40.0000 mg | Freq: Every day | INTRAVENOUS | Status: DC
  Administered 2015-04-23 – 2015-04-27 (×5): 40 mg via INTRAVENOUS
  Filled 2015-04-23 (×5): qty 40

## 2015-04-23 MED ORDER — LACTATED RINGERS IV SOLN
INTRAVENOUS | Status: DC | PRN
  Administered 2015-04-23 (×4): via INTRAVENOUS

## 2015-04-23 MED ORDER — EPHEDRINE SULFATE 50 MG/ML IJ SOLN
INTRAMUSCULAR | Status: AC
  Filled 2015-04-23: qty 1

## 2015-04-23 MED ORDER — ROCURONIUM BROMIDE 100 MG/10ML IV SOLN
INTRAVENOUS | Status: DC | PRN
  Administered 2015-04-23: 30 mg via INTRAVENOUS
  Administered 2015-04-23: 50 mg via INTRAVENOUS
  Administered 2015-04-23 (×6): 20 mg via INTRAVENOUS
  Administered 2015-04-23 (×2): 30 mg via INTRAVENOUS
  Administered 2015-04-23 (×3): 20 mg via INTRAVENOUS
  Administered 2015-04-23: 30 mg via INTRAVENOUS
  Administered 2015-04-23: 20 mg via INTRAVENOUS

## 2015-04-23 MED ORDER — STERILE WATER FOR INJECTION IJ SOLN
INTRAMUSCULAR | Status: AC
  Filled 2015-04-23: qty 10

## 2015-04-23 MED ORDER — SODIUM CHLORIDE 0.9 % IV SOLN
INTRAVENOUS | Status: DC
  Administered 2015-04-23 – 2015-04-24 (×2): via INTRAVENOUS
  Administered 2015-04-25: 75 mL/h via INTRAVENOUS

## 2015-04-23 MED ORDER — FLEET ENEMA 7-19 GM/118ML RE ENEM
1.0000 | ENEMA | Freq: Once | RECTAL | Status: DC | PRN
  Filled 2015-04-23: qty 1

## 2015-04-23 MED ORDER — MAGNESIUM SULFATE 2 GM/50ML IV SOLN: 2.0000 g | Freq: Every day | INTRAVENOUS | Status: DC | PRN

## 2015-04-23 MED ORDER — PHENOL 1.4 % MT LIQD: 1.0000 | OROMUCOSAL | Status: DC | PRN

## 2015-04-23 MED ORDER — ONDANSETRON HCL 4 MG/2ML IJ SOLN
4.0000 mg | Freq: Four times a day (QID) | INTRAMUSCULAR | Status: DC | PRN
  Administered 2015-04-29 – 2015-04-30 (×2): 4 mg via INTRAVENOUS
  Filled 2015-04-23 (×2): qty 2

## 2015-04-23 MED ORDER — DEXTROSE 5 % IV SOLN
10.0000 mg | INTRAVENOUS | Status: DC | PRN
  Administered 2015-04-23: 100 ug/min via INTRAVENOUS
  Administered 2015-04-23: 25 ug/min via INTRAVENOUS

## 2015-04-23 MED ORDER — BISACODYL 10 MG RE SUPP
10.0000 mg | Freq: Every day | RECTAL | Status: DC | PRN
  Administered 2015-04-30: 10 mg via RECTAL
  Filled 2015-04-23: qty 1

## 2015-04-23 MED ORDER — GLYCOPYRROLATE 0.2 MG/ML IJ SOLN
INTRAMUSCULAR | Status: AC
  Filled 2015-04-23: qty 3

## 2015-04-23 MED ORDER — CHLORHEXIDINE GLUCONATE 0.12% ORAL RINSE (MEDLINE KIT)
15.0000 mL | Freq: Two times a day (BID) | OROMUCOSAL | Status: DC
  Administered 2015-04-23 – 2015-04-26 (×6): 15 mL via OROMUCOSAL

## 2015-04-23 MED ORDER — MORPHINE SULFATE (PF) 2 MG/ML IV SOLN: 2.0000 mg | INTRAVENOUS | Status: DC | PRN

## 2015-04-23 MED ORDER — PROPOFOL 10 MG/ML IV BOLUS
INTRAVENOUS | Status: AC
  Filled 2015-04-23: qty 20

## 2015-04-23 MED ORDER — MIDAZOLAM HCL 2 MG/2ML IJ SOLN
INTRAMUSCULAR | Status: AC
  Filled 2015-04-23: qty 2

## 2015-04-23 MED ORDER — EPHEDRINE SULFATE 50 MG/ML IJ SOLN
INTRAMUSCULAR | Status: DC | PRN
  Administered 2015-04-23 (×6): 5 mg via INTRAVENOUS

## 2015-04-23 MED ORDER — ALBUTEROL SULFATE (2.5 MG/3ML) 0.083% IN NEBU: 2.5000 mg | INHALATION_SOLUTION | RESPIRATORY_TRACT | Status: DC | PRN

## 2015-04-23 MED ORDER — PANTOPRAZOLE SODIUM 40 MG PO TBEC: 40.0000 mg | DELAYED_RELEASE_TABLET | Freq: Every day | ORAL | Status: DC

## 2015-04-23 MED ORDER — DEXTROSE 5 % IV SOLN
1.5000 g | Freq: Two times a day (BID) | INTRAVENOUS | Status: AC
  Administered 2015-04-24 (×2): 1.5 g via INTRAVENOUS
  Filled 2015-04-23 (×2): qty 1.5

## 2015-04-23 MED ORDER — PHENYLEPHRINE HCL 10 MG/ML IJ SOLN
INTRAMUSCULAR | Status: DC | PRN
  Administered 2015-04-23 (×3): 200 ug via INTRAVENOUS

## 2015-04-23 MED ORDER — LABETALOL HCL 5 MG/ML IV SOLN
10.0000 mg | INTRAVENOUS | Status: AC | PRN
  Administered 2015-04-23 – 2015-04-27 (×4): 10 mg via INTRAVENOUS
  Filled 2015-04-23 (×3): qty 4

## 2015-04-23 MED ORDER — SENNOSIDES-DOCUSATE SODIUM 8.6-50 MG PO TABS
1.0000 | ORAL_TABLET | Freq: Every evening | ORAL | Status: DC | PRN
  Administered 2015-04-29: 1 via ORAL
  Filled 2015-04-23: qty 1

## 2015-04-23 MED ORDER — HEMOSTATIC AGENTS (NO CHARGE) OPTIME
TOPICAL | Status: DC | PRN
  Administered 2015-04-23: 1 via TOPICAL

## 2015-04-23 MED ORDER — CHLORHEXIDINE GLUCONATE CLOTH 2 % EX PADS: 6.0000 | MEDICATED_PAD | Freq: Once | CUTANEOUS | Status: DC

## 2015-04-23 MED ORDER — SODIUM CHLORIDE 0.9 % IV SOLN
INTRAVENOUS | Status: DC | PRN
  Administered 2015-04-23: 11:00:00

## 2015-04-23 MED ORDER — ROCURONIUM BROMIDE 50 MG/5ML IV SOLN
INTRAVENOUS | Status: AC
  Filled 2015-04-23: qty 1

## 2015-04-23 MED ORDER — NEOSTIGMINE METHYLSULFATE 10 MG/10ML IV SOLN
INTRAVENOUS | Status: AC
  Filled 2015-04-23: qty 1

## 2015-04-23 MED ORDER — PROPOFOL 10 MG/ML IV BOLUS
INTRAVENOUS | Status: DC | PRN
  Administered 2015-04-23: 100 mg via INTRAVENOUS

## 2015-04-23 MED ORDER — DOPAMINE-DEXTROSE 3.2-5 MG/ML-% IV SOLN
0.0000 ug/kg/min | INTRAVENOUS | Status: DC
  Administered 2015-04-23: 5 ug/kg/min via INTRAVENOUS
  Filled 2015-04-23: qty 250

## 2015-04-23 MED ORDER — SODIUM CHLORIDE 0.9 % IV SOLN: 500.0000 mL | Freq: Once | INTRAVENOUS | Status: DC | PRN

## 2015-04-23 MED ORDER — MIDAZOLAM HCL 5 MG/5ML IJ SOLN
INTRAMUSCULAR | Status: DC | PRN
  Administered 2015-04-23: 2 mg via INTRAVENOUS

## 2015-04-23 MED ORDER — PROTAMINE SULFATE 10 MG/ML IV SOLN
INTRAVENOUS | Status: DC | PRN
  Administered 2015-04-23: 60 mg via INTRAVENOUS

## 2015-04-23 MED ORDER — INSULIN ASPART 100 UNIT/ML ~~LOC~~ SOLN
0.0000 [IU] | SUBCUTANEOUS | Status: DC
Start: 2015-04-23 — End: 2015-04-29
  Administered 2015-04-23: 3 [IU] via SUBCUTANEOUS
  Administered 2015-04-23: 5 [IU] via SUBCUTANEOUS
  Administered 2015-04-24 (×2): 2 [IU] via SUBCUTANEOUS
  Administered 2015-04-24: 3 [IU] via SUBCUTANEOUS
  Administered 2015-04-25 (×2): 2 [IU] via SUBCUTANEOUS
  Administered 2015-04-25: 3 [IU] via SUBCUTANEOUS
  Administered 2015-04-25 – 2015-04-26 (×5): 2 [IU] via SUBCUTANEOUS
  Administered 2015-04-26: 3 [IU] via SUBCUTANEOUS
  Administered 2015-04-27 – 2015-04-28 (×6): 2 [IU] via SUBCUTANEOUS

## 2015-04-23 MED ORDER — IPRATROPIUM-ALBUTEROL 0.5-2.5 (3) MG/3ML IN SOLN
3.0000 mL | Freq: Four times a day (QID) | RESPIRATORY_TRACT | Status: DC
  Administered 2015-04-23 – 2015-04-28 (×19): 3 mL via RESPIRATORY_TRACT
  Filled 2015-04-23 (×19): qty 3

## 2015-04-23 MED ORDER — DOCUSATE SODIUM 100 MG PO CAPS
100.0000 mg | ORAL_CAPSULE | Freq: Every day | ORAL | Status: DC
  Administered 2015-04-28 – 2015-05-01 (×4): 100 mg via ORAL
  Filled 2015-04-23 (×4): qty 1

## 2015-04-23 MED ORDER — ONDANSETRON HCL 4 MG/2ML IJ SOLN
INTRAMUSCULAR | Status: AC
  Filled 2015-04-23: qty 2

## 2015-04-23 MED ORDER — ACETAMINOPHEN 325 MG PO TABS: 325.0000 mg | ORAL_TABLET | ORAL | Status: DC | PRN

## 2015-04-23 MED ORDER — GUAIFENESIN-DM 100-10 MG/5ML PO SYRP
15.0000 mL | ORAL_SOLUTION | ORAL | Status: DC | PRN
Start: 2015-04-23 — End: 2015-05-01

## 2015-04-23 MED ORDER — LABETALOL HCL 5 MG/ML IV SOLN
INTRAVENOUS | Status: AC
  Filled 2015-04-23: qty 4

## 2015-04-23 MED ORDER — SUCCINYLCHOLINE CHLORIDE 20 MG/ML IJ SOLN
INTRAMUSCULAR | Status: AC
  Filled 2015-04-23: qty 1

## 2015-04-23 MED ORDER — FENTANYL CITRATE (PF) 100 MCG/2ML IJ SOLN
100.0000 ug | INTRAMUSCULAR | Status: DC | PRN
  Administered 2015-04-24 (×2): 100 ug via INTRAVENOUS
  Filled 2015-04-23: qty 2

## 2015-04-23 MED ORDER — ALUM & MAG HYDROXIDE-SIMETH 200-200-20 MG/5ML PO SUSP
15.0000 mL | ORAL | Status: DC | PRN
  Administered 2015-04-29 – 2015-04-30 (×3): 30 mL via ORAL
  Filled 2015-04-23 (×3): qty 30

## 2015-04-23 MED ORDER — KCL IN DEXTROSE-NACL 20-5-0.45 MEQ/L-%-% IV SOLN
INTRAVENOUS | Status: DC
  Administered 2015-04-23: 22:00:00 via INTRAVENOUS
  Filled 2015-04-23 (×2): qty 1000

## 2015-04-23 MED ORDER — PROPOFOL 1000 MG/100ML IV EMUL
0.0000 ug/kg/min | INTRAVENOUS | Status: DC
  Administered 2015-04-23: 50 ug/kg/min via INTRAVENOUS
  Administered 2015-04-24: 40 ug/kg/min via INTRAVENOUS
  Filled 2015-04-23 (×2): qty 100

## 2015-04-23 MED ORDER — ALBUMIN HUMAN 5 % IV SOLN
INTRAVENOUS | Status: DC | PRN
  Administered 2015-04-23 (×5): via INTRAVENOUS

## 2015-04-23 MED ORDER — HYDRALAZINE HCL 20 MG/ML IJ SOLN
5.0000 mg | INTRAMUSCULAR | Status: DC | PRN
  Administered 2015-04-24: 5 mg via INTRAVENOUS
  Filled 2015-04-23: qty 1

## 2015-04-23 MED ORDER — DEXTROSE 5 % IV SOLN
1.5000 g | INTRAVENOUS | Status: DC
  Filled 2015-04-23: qty 1.5

## 2015-04-23 MED ORDER — PHENYLEPHRINE 40 MCG/ML (10ML) SYRINGE FOR IV PUSH (FOR BLOOD PRESSURE SUPPORT)
PREFILLED_SYRINGE | INTRAVENOUS | Status: AC
  Filled 2015-04-23: qty 10

## 2015-04-23 MED ORDER — LIDOCAINE HCL (CARDIAC) 20 MG/ML IV SOLN
INTRAVENOUS | Status: AC
  Filled 2015-04-23: qty 5

## 2015-04-23 MED ORDER — ACETAMINOPHEN 650 MG RE SUPP: 325.0000 mg | RECTAL | Status: DC | PRN

## 2015-04-23 MED ORDER — FENTANYL CITRATE (PF) 100 MCG/2ML IJ SOLN
100.0000 ug | INTRAMUSCULAR | Status: DC | PRN
  Administered 2015-04-24: 100 ug via INTRAVENOUS
  Filled 2015-04-23 (×2): qty 2

## 2015-04-23 MED ORDER — METOPROLOL TARTRATE 1 MG/ML IV SOLN
2.0000 mg | INTRAVENOUS | Status: AC | PRN
  Administered 2015-04-24 (×2): 5 mg via INTRAVENOUS
  Filled 2015-04-23 (×2): qty 5

## 2015-04-23 SURGICAL SUPPLY — 113 items
ADH SKN CLS APL DERMABOND .7 (GAUZE/BANDAGES/DRESSINGS) ×9
AGENT HMST SPONGE THK3/8 (HEMOSTASIS) ×3
BANDAGE ELASTIC 4 VELCRO ST LF (GAUZE/BANDAGES/DRESSINGS) ×3 IMPLANT
BANDAGE ESMARK 6X9 LF (GAUZE/BANDAGES/DRESSINGS) IMPLANT
BNDG CMPR 9X6 STRL LF SNTH (GAUZE/BANDAGES/DRESSINGS)
BNDG CONFORM 3 STRL LF (GAUZE/BANDAGES/DRESSINGS) ×3 IMPLANT
BNDG ESMARK 6X9 LF (GAUZE/BANDAGES/DRESSINGS)
BNDG GAUZE ELAST 4 BULKY (GAUZE/BANDAGES/DRESSINGS) ×5 IMPLANT
CANISTER SUCTION 2500CC (MISCELLANEOUS) ×5 IMPLANT
CANNULA VESSEL 3MM 2 BLNT TIP (CANNULA) ×2 IMPLANT
CATH EMB 3FR 80CM (CATHETERS) ×2 IMPLANT
CLIP TI MEDIUM 24 (CLIP) ×5 IMPLANT
CLIP TI WIDE RED SMALL 24 (CLIP) ×11 IMPLANT
COVER PROBE W GEL 5X96 (DRAPES) ×5 IMPLANT
COVER SURGICAL LIGHT HANDLE (MISCELLANEOUS) ×3 IMPLANT
CUFF TOURNIQUET SINGLE 18IN (TOURNIQUET CUFF) IMPLANT
CUFF TOURNIQUET SINGLE 24IN (TOURNIQUET CUFF) IMPLANT
CUFF TOURNIQUET SINGLE 34IN LL (TOURNIQUET CUFF) IMPLANT
CUFF TOURNIQUET SINGLE 44IN (TOURNIQUET CUFF) IMPLANT
DERMABOND ADVANCED (GAUZE/BANDAGES/DRESSINGS) ×6
DERMABOND ADVANCED .7 DNX12 (GAUZE/BANDAGES/DRESSINGS) IMPLANT
DRAIN CHANNEL 15F RND FF W/TCR (WOUND CARE) IMPLANT
DRAPE C-ARM 42X72 X-RAY (DRAPES) ×3 IMPLANT
DRAPE EXTREMITY T 121X128X90 (DRAPE) ×3 IMPLANT
DRAPE PROXIMA HALF (DRAPES) ×2 IMPLANT
DRSG COVADERM 4X14 (GAUZE/BANDAGES/DRESSINGS) ×6 IMPLANT
DRSG COVADERM 4X8 (GAUZE/BANDAGES/DRESSINGS) IMPLANT
DRSG EMULSION OIL 3X3 NADH (GAUZE/BANDAGES/DRESSINGS) ×1 IMPLANT
ELECT BLADE 4.0 EZ CLEAN MEGAD (MISCELLANEOUS) ×5
ELECT BLADE 6.5 EXT (BLADE) ×2 IMPLANT
ELECT CAUTERY BLADE 6.4 (BLADE) ×2 IMPLANT
ELECT REM PT RETURN 9FT ADLT (ELECTROSURGICAL) ×10
ELECTRODE BLDE 4.0 EZ CLN MEGD (MISCELLANEOUS) IMPLANT
ELECTRODE REM PT RTRN 9FT ADLT (ELECTROSURGICAL) ×3 IMPLANT
EVACUATOR SILICONE 100CC (DRAIN) IMPLANT
GAUZE SPONGE 4X4 12PLY STRL (GAUZE/BANDAGES/DRESSINGS) ×5 IMPLANT
GAUZE SPONGE 4X4 16PLY XRAY LF (GAUZE/BANDAGES/DRESSINGS) ×8 IMPLANT
GEL ULTRASOUND 20GR AQUASONIC (MISCELLANEOUS) ×5 IMPLANT
GLOVE BIO SURGEON STRL SZ7 (GLOVE) ×20 IMPLANT
GLOVE BIO SURGEON STRL SZ7.5 (GLOVE) ×4 IMPLANT
GLOVE BIOGEL PI IND STRL 6.5 (GLOVE) ×5 IMPLANT
GLOVE BIOGEL PI IND STRL 7.0 (GLOVE) IMPLANT
GLOVE BIOGEL PI IND STRL 7.5 (GLOVE) ×8 IMPLANT
GLOVE BIOGEL PI IND STRL 8 (GLOVE) ×1 IMPLANT
GLOVE BIOGEL PI INDICATOR 6.5 (GLOVE) ×10
GLOVE BIOGEL PI INDICATOR 7.0 (GLOVE) ×4
GLOVE BIOGEL PI INDICATOR 7.5 (GLOVE) ×12
GLOVE BIOGEL PI INDICATOR 8 (GLOVE) ×2
GLOVE ECLIPSE 6.5 STRL STRAW (GLOVE) ×10 IMPLANT
GLOVE ECLIPSE 7.0 STRL STRAW (GLOVE) ×12 IMPLANT
GOWN STRL REUS W/ TWL LRG LVL3 (GOWN DISPOSABLE) ×9 IMPLANT
GOWN STRL REUS W/TWL LRG LVL3 (GOWN DISPOSABLE) ×60
GRAFT HEMASHIELD 14X8MM (Vascular Products) ×2 IMPLANT
GRAFT PROPATEN W/RING 6X80X60 (Vascular Products) ×2 IMPLANT
HEMOSTAT SPONGE AVITENE ULTRA (HEMOSTASIS) ×2 IMPLANT
INSERT FOGARTY 61MM (MISCELLANEOUS) ×5 IMPLANT
INSERT FOGARTY SM (MISCELLANEOUS) ×10 IMPLANT
KIT BASIN OR (CUSTOM PROCEDURE TRAY) ×5 IMPLANT
KIT ROOM TURNOVER OR (KITS) ×5 IMPLANT
LIQUID BAND (GAUZE/BANDAGES/DRESSINGS) ×5 IMPLANT
LOOP VESSEL MAXI BLUE (MISCELLANEOUS) ×2 IMPLANT
LOOP VESSEL MINI RED (MISCELLANEOUS) ×2 IMPLANT
MARKER GRAFT CORONARY BYPASS (MISCELLANEOUS) IMPLANT
NS IRRIG 1000ML POUR BTL (IV SOLUTION) ×12 IMPLANT
PACK AORTA (CUSTOM PROCEDURE TRAY) ×5 IMPLANT
PACK GENERAL/GYN (CUSTOM PROCEDURE TRAY) ×1 IMPLANT
PACK PERIPHERAL VASCULAR (CUSTOM PROCEDURE TRAY) ×3 IMPLANT
PAD ARMBOARD 7.5X6 YLW CONV (MISCELLANEOUS) ×10 IMPLANT
PADDING CAST COTTON 6X4 STRL (CAST SUPPLIES) IMPLANT
PENCIL BUTTON HOLSTER BLD 10FT (ELECTRODE) ×3 IMPLANT
PUNCH AORTIC ROTATE 5MM 8IN (MISCELLANEOUS) ×2 IMPLANT
RETAINER VISCERA MED (MISCELLANEOUS) ×5 IMPLANT
SPECIMEN JAR SMALL (MISCELLANEOUS) ×2 IMPLANT
SPONGE GAUZE 4X4 12PLY STER LF (GAUZE/BANDAGES/DRESSINGS) ×2 IMPLANT
STAPLER VISISTAT 35W (STAPLE) ×9 IMPLANT
STOPCOCK 4 WAY LG BORE MALE ST (IV SETS) ×2 IMPLANT
SUT ETHIBOND 5 LR DA (SUTURE) IMPLANT
SUT ETHILON 3 0 PS 1 (SUTURE) ×5 IMPLANT
SUT GORETEX 5 0 TT13 24 (SUTURE) ×4 IMPLANT
SUT MNCRL AB 4-0 PS2 18 (SUTURE) ×12 IMPLANT
SUT PDS AB 1 TP1 96 (SUTURE) ×10 IMPLANT
SUT PROLENE 2 0 MH 48 (SUTURE) IMPLANT
SUT PROLENE 3 0 SH 48 (SUTURE) IMPLANT
SUT PROLENE 3 0 SH1 36 (SUTURE) ×4 IMPLANT
SUT PROLENE 5 0 C 1 24 (SUTURE) ×13 IMPLANT
SUT PROLENE 5 0 C 1 36 (SUTURE) IMPLANT
SUT PROLENE 6 0 BV (SUTURE) ×13 IMPLANT
SUT PROLENE 7 0 BV 1 (SUTURE) ×12 IMPLANT
SUT SILK 2 0 (SUTURE) ×10
SUT SILK 2 0 FS (SUTURE) ×2 IMPLANT
SUT SILK 2 0 SH (SUTURE) ×5 IMPLANT
SUT SILK 2 0 SH CR/8 (SUTURE) ×5 IMPLANT
SUT SILK 2 0 TIES 17X18 (SUTURE)
SUT SILK 2-0 18XBRD TIE 12 (SUTURE) ×4 IMPLANT
SUT SILK 2-0 18XBRD TIE BLK (SUTURE) ×3 IMPLANT
SUT SILK 3 0 (SUTURE) ×15
SUT SILK 3 0 TIES 17X18 (SUTURE)
SUT SILK 3-0 18XBRD TIE 12 (SUTURE) ×3 IMPLANT
SUT SILK 3-0 18XBRD TIE BLK (SUTURE) ×3 IMPLANT
SUT SILK 4 0 (SUTURE) ×5
SUT SILK 4-0 18XBRD TIE 12 (SUTURE) IMPLANT
SUT VIC AB 2-0 CT1 27 (SUTURE) ×15
SUT VIC AB 2-0 CT1 TAPERPNT 27 (SUTURE) ×8 IMPLANT
SUT VIC AB 3-0 SH 27 (SUTURE) ×60
SUT VIC AB 3-0 SH 27X BRD (SUTURE) ×6 IMPLANT
SYRINGE 3CC LL L/F (MISCELLANEOUS) ×2 IMPLANT
TAPE UMBILICAL COTTON 1/8X30 (MISCELLANEOUS) ×2 IMPLANT
TOWEL BLUE STERILE X RAY DET (MISCELLANEOUS) ×10 IMPLANT
TRAY FOLEY W/METER SILVER 16FR (SET/KITS/TRAYS/PACK) ×5 IMPLANT
TUBE ANAEROBIC SPECIMEN COL (MISCELLANEOUS) IMPLANT
TUBING EXTENTION W/L.L. (IV SETS) ×3 IMPLANT
UNDERPAD 30X30 INCONTINENT (UNDERPADS AND DIAPERS) ×7 IMPLANT
WATER STERILE IRR 1000ML POUR (IV SOLUTION) ×8 IMPLANT

## 2015-04-23 NOTE — Op Note (Addendum)
OPERATIVE NOTE   PROCEDURE: 1. Aortobifemoral bypass (14 x 8 mm) 2. Construction of composite graft 3. Right common femoral artery to posterior tibial artery with composite graft 4. Left common femoral artery to superficial femoral artery bypass with reversed ipsilateral greater saphenous vein  5. Right fifth toe amputation  PRE-OPERATIVE DIAGNOSIS: right foot gangrene, chronic bilateral external iliac artery occlusion  POST-OPERATIVE DIAGNOSIS: same as above   SURGEON: Adele Barthel, MD  ASSISTANT(S): Dr. Ruta Hinds; Gerri Lins, [facility]   ANESTHESIA: general  ESTIMATED BLOOD LOSS: 900 cc  FLUIDS: 7 L  UOP: 2 L  FINDING(S): 1.  Minimally calcified infrarenal aorta 2.  Patent inferior mesenteric artery  3.  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  Adhesions from duodenum 6.  Dilated redundant transverse and sigmoid colon 7.  Weakness/lax fascia in periumbilical segment 8.  Dopplerable bilateral posterior tibial artery (R>L) 9.  Small plaque in right posterior tibial artery  10.  Acceptable bleeding from right fifth toe base   SPECIMEN(S):  Right fifth toe  INDICATIONS:  Craig Bentley is a 60 y.o. male who presents with right foot gangrene.  His angiogram and CTA abd/pelvis demonstrated chronically occluded bilateral external iliac artery and patent bilateral common iliac and internal iliac arteries and patent Inferior mesenteric artery.  There was no evidence of retrograde flow from the common femoral artery back to the external iliac artery.  He subsequently needed: aortobifemoral bypass in end-to-side configuration, right common femoral artery to posterior tibial artery bypass, and right fifth toe amputation.  The patient is aware the risks of this operation include but are not limited to: bleeding, need for transfusion, infection, death, stroke, paralysis, wound complications, bowel injuries, impotence, bowel  ischemia, extended ventilation  nerve damage, need for additional procedures in the future, wound complications, inability to complete the bypass, and future ventral hernias.  Overall, I cited a mortality rate of >10% and morbidity rate of 30%.  The risk, benefits, and alternative for bypass operations were discussed with the patient.  The patient is aware the risks include but are not limited to: bleeding, infection, myocardial infarction, stroke, limb loss,  The patient is aware of these risks and agreed to proceed.   DESCRIPTION: After obtaining full informed written consent, the patient was brought back to the operating room and placed supine upon the operating table.  The patient received IV antibiotics prior to induction.  After obtaining adequate anesthesia, the patient was prepped and draped in the standard fashion for: aortobifemoral bypass with leg bypass.    I started in the left groin.  Under Sonosite guidance, I identified the left common femoral artery.  I then made a longitudinal incision over the artery and dissected through the subcutaneous tissue with electrocautery until I had the left common femoral artery dissected out.  There was no pulse in this artery.  I dissected out the common femoral artery from the femoral bifurcation up to the distal external iliac artery.  In this process,  A posterior branch of the profunda femoral artery was was injured.  There was perfuse bleeding, so I had to clamp common femoral artery and profunda femoral artery.  I could not identify the bleeding point due to the position of the branch.  I tied off the proximal superficial femoral artery with two 2-0 silk ties and then transected the proximal superficial femoral artery, as the proximal SFA was occluded on preop angiogram.  This allowed we to  see the bleeding point.  This turned out the be an avulsed branch from the profunda femoral artery deep to the superficial femoral artery.  I repaired this with a  figure-of-eight stitch of 6-0 Prolene.  I then bluntly dissected a tunnel under the inguinal ligament.  I ligated the crossing vein located here and continued to dissect bluntly immediately on top the external iliac artery which was heavily calcified.    I turned my attention to the right groin.  Under Sonosite guidance, I identified the right common femoral artery.  I then made a longitudinal incision over the artery and dissected through the subcutaneous tissue with electrocautery until I had the right common femoral artery dissected out.  There was no pulse in this artery.  I dissected out the common femoral artery from the femoral bifurcation up to the distal external iliac artery.  I then bluntly dissected a tunnel under the inguinal ligament.  I ligated the crossing vein located here and continued to dissect bluntly immediately on top the external iliac artery which was heavily calcified.    At this point, Dr. Oneida Alar scrubbed into this case and dissected out the left greater saphenous vein with skip incisions.  He ligated side branches with 4-0 silk ties and clips.  He dissected out the vein from the saphenofemoral junction down to the mid-calf.  Wet Raytecs were packed into each harvest incision.    At this point, I made an incision from the xiphoid process down to the public bone in the mid-line, veering around the umbilicus.  I dissected down to the mid-line fascia and then open the fascia in the midline with electrocautery.  I then opened the peritoneum bluntly.  I opened the rest of the fascia and peritoneum under direct visualization.  I place a Balfour retractor to get lateral retraction.  I then set up the Omnitract retractor.  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  I eviscerated the transverse colon into a wet towel.  I then identified the ligament of Treitz and then ran the bowel.  There was no  obvious tumor but there was evidence of adhesions of the duodenum to mesentery.  I sharply took down the adhesions and freed up the duodenum.  I then eviscerated the small intestine into a wet towel.  The Omnitract retractors were positioned to obtain exposure of the retroperitoneum.  I made a rent in the retroperitoneum with electrocautery.  I dissected down to the aorta.  I dissected out the infrarenal segment, identifying the renal arteries and left renal vein.  I then dissected distally until I had the common iliac arteries exposed.  At this point, I bluntly dissected a tunnel on tpo the right common iliac artery, keep strictly on the right common iliac artery.  I bluntly dissected an inguinal tunnel on the right iliac arterial system, immediately on top of the artery.  I passed a sponge stick through this tunnel and then delivered an umbilical tape through this tunnel.  I clamped the two ends of the umbilical tape.  This same maneuver was repeated on the left side.    At this point, the patient was given 8000 units of Heparin intravenously, which was a therapeutic bolus.  An additional 2000 units of Heparin was administered every hour after initial bolus to maintain anticoagulation.  In total, 18000 units of Heparin was administrated to achieve and maintain a therapeutic level of anticoagulation.  After waiting three minutes, I clamped the aorta in the infrarenal position ~1-2 cm distal to the left renal artery.  I then clamped the aorta ~5 cm distally.  I made stab incision with a 11-blade in the aorta.  I extended this incision proximally and distally.  I then open the aortotomy with a 5 aortic punch along the length of this arteriotomy.  Based on measurements, I selected a 14 mm x 8 mm Gelweave aortobifemoral graft.  I cut the main body at a bevel to facilitate an end-to-side anastomosis, shortening the main body length in the process.  I sewed the graft to the aorta in an end-to-side configuration with a  running stitch of 3-0 Prolene.  Prior to completing this anastomosis, I pulled up on the suture line with a nerve hook.  I clamped the limbs of the graft.  I released the proximal clamp to test the anastomosis.  There was one area that require a repair stitch.  I let each limb bleed to test the flow.  There was excellent pulsatile bleeding.  I reapplied the clamp proximally and washed out the graft with heparinized saline.  At this point, I separately clamped each limb of the graft and released all clamps.  There was no active bleeding from the suture line.  I then tied the right limb to the umbilical tape in the right iliac tunnel.  I pulled the right limb through the tunnel while maintaining orientation.  I repeated this on the left side.  At this point, I turned my attention to the left groin.  I clamped the profunda femoral artery and distal external iliac artery.  I took off the tie on the proximal superficial femoral artery.  There did not appear to be a lumen in this superficial femoral artery.  I opened the common femoral artery through the superficial femoral artery orifice up to mid-segment.  There was no blood flow the common femoral artery with a tubular structure in the lumen of the common femoral artery.  I released all clamps and there was no bleeding.  I sharply opened the tubular structure.  There was no lumen in this structure.  I did an endaretectomy and removed this presumed plaque from the profunda femoral artery orifice.  There was excellent backbleeding from the profunda femoral artery.  I reclamped this artery.  I extended the endarterectomy into the mid-common femoral artery.  I examined the profunda femoral artery orifice, and I felt that an adequate endarterectomy has been completed.  I was reluctant to continue an endarterectomy of the profunda femoral artery given its fragile status.  Once I felt no further residual plaque was present, I pulled the left aortobifemoral limb to  appropriate tension and spatulated the limb to meet the dimensions of the arteriotomy.  I shortened the limb in this process.  The limb was sewn onto the left common femoral artery with a running stitch of 5-0 Prolene, in an end-to-side configuration.  Prior to completing this anastomosis, I pulled up the suture line with a nerve hook.  I also backbled the profunda femoral artery, which continued to have excellent blood flow.  I then allowed the left aortobifemoral limb to bleed in an antegrade fashion.  There was pulsatile bleeding.  I washed out the lumen and completed the anastomosis in the usual fashion.  At this point, I released all clamps on the left profunda femoral artery and left aortobifemoral limb.  Distally there was a monophasic signal in the  posterior tibial artery.  At this point, I turned my attention to the right groin,  I clamped the distal external iliac artery, and placed the superficial femoral artery and profunda femoral artery under tension.  I made an arteriotomy in the mid-segment and extended it down to just proximal to the femoral bifurcation and proximally to mid-segment.  The common femoral artery has some moderate posterior plaque but not enough to need endarterectomy.  There was no backbleeding from the right superficial femoral artery and there was acceptable backbleeding from the profunda femoral artery.  I put all vessels under tension again.   I spatulated the right aortobifemoral limb to the dimensions of the arteriotomy.  I sewed the right aortobifemoral limb to the right common femoral artery with a running stitch of 5-0 Prolene in an end-to-side configuration.  I pulled up on the suture line with a nerve hook to tighten it.  Prior to this maneuver, I backbled all vessels and allowed the aortobifemoral limb to bleeding in an antegrade fashion.  On this side, there remained no dopplerable signals, so proceeding with the right common femoral artery to posterior tibial artery  bypass was necessary.  At this point, all clamps were removed and vessel loops were released.  At this point, I re-examined the abdominal cavity.  There was some mild oozing without active bleeding.  I released all retractors and replaced the bowels into their anatomic position.  I put a wet towel over the bowels.    At this point, I turned my attention to the right calf.  I made an incision one finger-width posterior to the tibia and dissected through the subcutaneous tissue and fascia with electrocautery.  I released the medial gastrocneumius muscle and soleus muscle from the tibia with electrocautery.  I dissected out the medial popliteal vein and retracted it posteriorly.  I then probed the popliteal artery.  Distally it was severely calcified.  I dissected the popliteal vein out distally, in the process identifying the posterior tibial artery.  I dissected from from the tibioperoneal trunk down to the bifurcation into peroneal and posterior tibial artery.  I interrogated both with doppler.  There was no signal in the tibioperoneal trunk, and the posterior tibial artery did not have a signal until mid-segment.  I dissected out the posterior tibial artery proximally and distally.  I repacked this exposure with a wet Raytec.  At this point, I  finished harvesting the left greater saphenous vein.  I extended the calf incision distally until I had adequate length.  I clamped the greater saphenous vein distally and transected it.  I tied off the greater saphenous vein distally with 2-0 silk.  I then delivered the vein through the skip incisions up to the saphenofemoral junction.  I clamped the saphenofemoral junction with a Cooley clamp.  I transected the greater saphenous vein and placed it into heparinized saline.  I oversewed the saphenofemoral junction with a double layer of 5-0 Prolene.  I then tested the greater saphenous vein by tying an vessel cannula into the greater saphenous vein.  I hydrodistended  this vein.  Unfortunately a multiple segments of this vein only distended to 2 mm.  Other segments only dilated to 3 mm.  There were two segments that dilated to 4-5 mm.  Essentially this vein is only usable in a composite fashion.  I obtained a 6 mm external ringed Propaten graft.  I dissected adjacent to the popliteal vessels between the femoral condyles from the popliteal  exposure to the right groin with a long Gore tunneler.  I tied the Propaten graft to the inner cannula and delivered the graft through the tunnel, while maintaining orientation.  I removed the tunnel, leaving the graft in place.    At this point, I turned my attention back to the right groin.  I clamped the right distal external iliac artery, profunda femoral artery, and aortobifemoral limb.  I made an incision in the aortobifemoral limb hood.  I extended the graftotomy proximally and distally.  I spatulated the proximal Propaten graft to meet the dimensions of the graftotomy.  I sewed the Propaten to the aortobifemoral limb in an end-to-side configuration with a running stitch of CV-5.  Prior to completing this anastomosis, I backbleed all vessels and all graft.  There was no clots.  I pulled up on the anastomotic line with nerve hook and completed the anastomosis in the usual fashion.   I clamped the graft distally in the calf.  I released the proximal clamps and vessel loops.  I reapplied the clamp on the proximal graft.  At this point, I turned my attention to the right calf.  I reset my exposure here.  I pulled the graft to tension.  I transected the Propaten graft distally in the proximal calf.  I removed a few extra rings.  I spatulated the graft slightly.  I then cut off a segment of the vein that was 4-5 mm.  I spatulated this vein to the dimensions of the graft.  I sewed the vein to graft in an end-to-end fashion with a running stitch of 6-0 Prolene.  I released all clamps.  There was a pulse in the proximal vein up to the first  competent valve.  I lysed the valves with valvutome.  I tested this composite graft.  There was excellent pulsatile bleeding.  I reapplied the clamps to the Propaten graft in the calf.  I clamped the posterior tibial artery proximally and distally.  I made an incision of the posterior tibial artery with a 11-blade.  I extended the arteriotomy proximally and distally.  There was a small plaque in the posterior tibial artery that I felt might be problematic to complete an endarterectomy.   I spatulated the vein end of this composite graft, sharply shortened the vein in this process.  I sewed the vein to the posterior tibial artery in an end-to-side configuration with a running stitch of 7-0 Prolene.  I backbled the posterior tibial artery proximally and distally.  I also allowed the composite graft to bleed antegrade.  There was no thrombus noted from any vessels.  I released all clamps and immediately there was a pulse in the right posterior tibial artery.  Distally there was a dopplerable posterior tibial artery signal with biphasic signals.    At this point, I decided to re-evaluate the left superficial femoral artery.  I removed the tie on the superficial femoral artery.  There was vigorous backbleeding from the left superficial femoral artery.  Given the short distance between the ligated superficial femoral artery and left common femoral artery, I felt an attempt at revascularization of the left superficial femoral artery was indicated.  I extended my dissection on the left superficial femoral artery.  I did a limited endarterectomy on the left proximal superficial femoral artery.  I spatulated the proximal left superficial femoral artery.  I reclamped the left profunda femoral artery and aortobifemoral limb and proximal common femoral artery.  I made a graftotomy  in the left aortobifemoral limb hood.  I extended this graftotomy with Potts scissors.  I widen the graftotomy with a 5 aortic punch.  I harvested  another segment of the left greater saphenous vein that was 4-5 mm in diameter.  I spatulated the vein to meet the dimensions of the graftotomy.  I sewed the vein to the graftotomy with a running stitch of 6-0 Prolene.  This vein segment was sewn in reverse configuration.  I spatulated the vein to meet the dimensions of the proximal superficial femoral artery .  I sewed the vein to the proximal superficial femoral artery in an end-to-end configuration.  Prior to completing this anastomosis, I backbled the profunda femoral artery, superficial femoral artery, distal external iliac artery, and the left aortobifemoral limb.  There was no thrombus and good bleeding from the profunda femoral artery and superficial femoral artery.  There was pulsatile bleeding from the aortobifemoral limb.  The signal in the superficial femoral artery was compatible with intact blood flow.  Distally the posterior tibial artery signal was improved but still monophasic.    At this point, I gave the patient 60 mg of Protamine.  I packed all incisions with Avitene.  After waiting a few minutes, active bleeding stopped in all incisions.  I closed each groin with a double layer of 2-0 Vicryl immediate above the graft.  The subcutaneous tissue in each groin was then reapproximated with a double layer of 3-0 Vicryl.  The skin in each groin was then reapproximated with a running subcuticular stitch of 4-0 Monocryl.  The skin was cleaned, dried, and reinforced with Dermabond.    I then turned my attention to the right popliteal exposure.  This was repaired by reapproximating the soleus muscle.  I then reattached the medial gastrocnemius muscle.  The subcutaneous tissue was then reapproximated with a running stitch of 3-0 Vicryl.  The skin was then reapproximated with a running subcuticular stitch of 4-0 Monocryl.  The skin was cleaned, dried, and reinforced with Dermabond.    I turned my attention to the abdomen, I replaced the retractors and  then washed out the abdominal cavity.  There was no active bleeding.  I reapproximated the retroperitoneum with a running stitch of 3-0 Vicryl.  I then ran the bowels, looking for any injury.  There appeared to be no injuries.  I removed all retractors and replaced the bowels into anatomic position.  I reapproximated the fascia by running double stranded PDS-1 from top of the incision and bottom of the incision, tying together in the middle.  Again, the mid-segment of the fascia was already weakned without any frank hernias.  I reapproximated the skin with staples.  At this point, I turned my attention to the vein harvest incisions.  I sewed a double layer of 3-0 Vicryl to close the vein harvest space.  The skin was reapproximated with staples.  All staple lines were covered with Coverderm.  At this point, I turned my attention to the right foot.  I made an racquet incision around the right fifth proximal phalange.  There was some active bleeding from the incision.  I dissected down to the proximal phalanage.  Eventually I able to release the metatarsal-phalange joint and removed the dead fifth toe as a specimen.  I reapproximated the subcutaneous tissue with a running 3-0 Vicryl.  I then closed the skin with interrupted 3-0 Nylon stitches.  Prior leaving the OR, I tested the distal pedal signals.  There were dopperable  bilateral posterior tibial artery signals.  This patient will remain intubated as he continues to require vasopressor support to maintain his blood pressure   COMPLICATIONS: none  CONDITION: guarded   Adele Barthel, MD [facility] Office: [phone] Pager: [phone]  04/23/2015, 7:58 PM

## 2015-04-23 NOTE — Anesthesia Procedure Notes (Addendum)
Central Venous Catheter Insertion Performed by: anesthesiologist Patient location: Pre-op. Preanesthetic checklist: patient identified, IV checked, site marked, risks and benefits discussed, surgical consent, monitors and equipment checked, pre-op evaluation, timeout performed and anesthesia consent Position: Trendelenburg Lidocaine 1% used for infiltration Landmarks identified Catheter size: 9 Fr MAC introducer Procedure performed using ultrasound guided technique. Attempts: 1 Following insertion, line sutured, dressing applied and Biopatch. Post procedure assessment: blood return through all ports, free fluid flow and no air. Patient tolerated the procedure well with no immediate complications.    Central Venous Catheter Insertion Performed by: anesthesiologist Patient location: Pre-op. Preanesthetic checklist: patient identified, IV checked, site marked, risks and benefits discussed, surgical consent, monitors and equipment checked, pre-op evaluation, timeout performed and anesthesia consent Landmarks identified PA cath was placed.Swan type and PA catheter depth:thermodilation and 52PA Cath depth:52 Procedure performed without using ultrasound guided technique. Patient tolerated the procedure well with no immediate complications.    Procedure Name: Intubation Date/Time: 04/23/2015 9:04 AM Performed by: Shirlyn Goltz Pre-anesthesia Checklist: Patient identified, Emergency Drugs available, Suction available and Patient being monitored Patient Re-evaluated:Patient Re-evaluated prior to inductionOxygen Delivery Method: Circle system utilized Intubation Type: IV induction Ventilation: Mask ventilation without difficulty Laryngoscope Size: Mac and 4 Grade View: Grade I Tube type: Oral Tube size: 7.5 mm Number of attempts: 1 Airway Equipment and Method: Stylet Placement Confirmation: ETT inserted through vocal cords under direct vision,  positive ETCO2 and breath sounds checked-  equal and bilateral Secured at: 21 cm Tube secured with: Tape Dental Injury: Teeth and Oropharynx as per pre-operative assessment

## 2015-04-23 NOTE — H&P (View-Only) (Signed)
Referred by:  Dr. Allyson Sabal  Reason for referral: right toe gangrene  History of Present Illness  Craig Bentley is a 60 y.o. (February 12, 1956) male with schizophrenia and diabetes who presents with chief complaint: right foot pain.  Onset of symptom occurred in November 2017.  Pain is described as aching, severity 1-5/10, and associated with movement, rest and manipulating the right foot.  Patient has attempted to treat this pain with rest.  The patient has no rest pain symptoms.  Wounds include: right 5th toe blackness and painful area in R 4th toe.  Atherosclerotic risk factors include: HTN, DM, active smoker.  Past Medical History  Diagnosis Date  . Hypertension   . Diabetes mellitus   . Schizophrenia, schizo-affective ([place])   . COPD (chronic obstructive pulmonary disease) ([place])   . Constipation   . Fecal impaction [facility])     Surgical History: unknown, patient denies   Social History   Social History  . Marital Status: Single    Spouse Name: N/A  . Number of Children: N/A  . Years of Education: N/A   Occupational History  . Not on file.   Social History Main Topics  . Smoking status: Current Every Day Smoker -- 1.50 packs/day    Types: Cigarettes  . Smokeless tobacco: Not on file  . Alcohol Use: No  . Drug Use: No  . Sexual Activity: No   Other Topics Concern  . Not on file   Social History Narrative    Family History  Problem Relation Age of Onset  . Mental illness Mother     Current Facility-Administered Medications  Medication Dose Route Frequency Provider Last Rate Last Dose  . 0.9 %  sodium chloride infusion   Intravenous Continuous Reyne Dumas, MD 75 mL/hr at 04/03/15 1155    . acetaminophen (TYLENOL) tablet 650 mg  650 mg Oral Q6H PRN Bonnielee Haff, MD   650 mg at 04/02/15 1109   Or  . acetaminophen (TYLENOL) suppository 650 mg  650 mg Rectal Q6H PRN Bonnielee Haff, MD      . ceFEPIme (MAXIPIME) 2 g in dextrose 5 % 50 mL IVPB  2 g Intravenous Q8H Angela Adam, RPH   2 g at 04/03/15 1223  . cloZAPine (CLOZARIL) tablet 100 mg  100 mg Oral BID Bonnielee Haff, MD   100 mg at 04/03/15 1619  . cloZAPine (CLOZARIL) tablet 400 mg  400 mg Oral QHS Bonnielee Haff, MD   400 mg at 04/02/15 2140  . dicyclomine (BENTYL) tablet 20 mg  20 mg Oral BID PRN Bonnielee Haff, MD      . donepezil (ARICEPT) tablet 5 mg  5 mg Oral QHS Bonnielee Haff, MD   5 mg at 04/02/15 2140  . enoxaparin (LOVENOX) injection 40 mg  40 mg Subcutaneous Q24H Bonnielee Haff, MD   40 mg at 04/02/15 1837  . feeding supplement (GLUCERNA SHAKE) (GLUCERNA SHAKE) liquid 237 mL  237 mL Oral BID BM Bonnielee Haff, MD   237 mL at 04/03/15 1618  . gabapentin (NEURONTIN) capsule 300 mg  300 mg Oral TID Bonnielee Haff, MD   300 mg at 04/03/15 1619  . haloperidol (HALDOL) tablet 10 mg  10 mg Oral QHS Bonnielee Haff, MD   10 mg at 04/02/15 2141  . insulin aspart (novoLOG) injection 0-15 Units  0-15 Units Subcutaneous TID WC Bonnielee Haff, MD   3 Units at 04/03/15 0809  . ipratropium-albuterol (DUONEB) 0.5-2.5 (3) MG/3ML nebulizer solution 3 mL  3 mL Nebulization Q6H PRN Bonnielee Haff, MD      . lactulose (CHRONULAC) 10 GM/15ML solution 30 g  30 g Oral BID Bonnielee Haff, MD   30 g at 04/03/15 0941  . lisinopril (PRINIVIL,ZESTRIL) tablet 20 mg  20 mg Oral Daily Bonnielee Haff, MD   20 mg at 04/03/15 0941  . metoprolol tartrate (LOPRESSOR) tablet 25 mg  25 mg Oral BID Bonnielee Haff, MD   25 mg at 04/03/15 0941  . metroNIDAZOLE (FLAGYL) tablet 500 mg  500 mg Oral 3 times per day Bonnielee Haff, MD   500 mg at 04/03/15 1430  . morphine 2 MG/ML injection 2 mg  2 mg Intravenous Q3H PRN Bonnielee Haff, MD   2 mg at 04/02/15 1838  . oxyCODONE (Oxy IR/ROXICODONE) immediate release tablet 5 mg  5 mg Oral Q4H PRN Bonnielee Haff, MD   5 mg at 04/03/15 1619  . pantoprazole (PROTONIX) EC tablet 40 mg  40 mg Oral Daily Bonnielee Haff, MD   40 mg at 04/03/15 0941  . polyethylene glycol (MIRALAX / GLYCOLAX) packet  17 g  17 g Oral Daily PRN Bonnielee Haff, MD      . senna (SENOKOT) tablet 8.6 mg  1 tablet Oral BID Bonnielee Haff, MD   8.6 mg at 04/03/15 0941  . simvastatin (ZOCOR) tablet 5 mg  5 mg Oral QHS Bonnielee Haff, MD   5 mg at 04/02/15 2141  . trihexyphenidyl (ARTANE) tablet 5 mg  5 mg Oral BID WC Bonnielee Haff, MD   5 mg at 04/03/15 1619  . vancomycin (VANCOCIN) 1,500 mg in sodium chloride 0.9 % 500 mL IVPB  1,500 mg Intravenous Q12H Emiliano Dyer, RPH   1,500 mg at 04/03/15 0602    No Known Allergies   REVIEW OF SYSTEMS:  (Positives checked otherwise negative)  CARDIOVASCULAR:   '[ ]'$  chest pain,  '[ ]'$  chest pressure,  '[ ]'$  palpitations,  '[ ]'$  shortness of breath when laying flat,  '[ ]'$  shortness of breath with exertion,   '[ ]'$  pain in feet when walking,  '[ ]'$  pain in feet when laying flat, '[ ]'$  history of blood clot in veins (DVT),  '[ ]'$  history of phlebitis,  '[ ]'$  swelling in legs,  '[ ]'$  varicose veins  PULMONARY:   '[ ]'$  productive cough,  '[ ]'$  asthma,  '[ ]'$  wheezing  NEUROLOGIC:   '[ ]'$  weakness in arms or legs,  '[ ]'$  numbness in arms or legs,  '[ ]'$  difficulty speaking or slurred speech,  '[ ]'$  temporary loss of vision in one eye,  '[ ]'$  dizziness  HEMATOLOGIC:   '[ ]'$  bleeding problems,  '[ ]'$  problems with blood clotting too easily  MUSCULOSKEL:   '[ ]'$  joint pain, '[ ]'$  joint swelling  GASTROINTEST:   '[ ]'$  vomiting blood,  '[ ]'$  blood in stool     GENITOURINARY:   '[ ]'$  burning with urination,  '[ ]'$  blood in urine  PSYCHIATRIC:   '[ ]'$  history of major depression '[x]'$  schizophrenia  INTEGUMENTARY:   '[ ]'$  rashes,  '[x]'$  ulcers  CONSTITUTIONAL:   '[ ]'$  fever,  '[ ]'$  chills   For VQI Use Only  PRE-ADM LIVING: Nursing home  AMB STATUS: Ambulatory  CAD Sx: None  PRIOR CHF: None  STRESS TEST: '[x]'$  No, '[ ]'$  Normal, '[ ]'$  + ischemia, '[ ]'$  + MI, '[ ]'$  Both   Physical Examination  Filed Vitals:   04/02/15 2224 04/03/15 0443  04/03/15 0941 04/03/15 1413  BP: 120/49 158/70 158/70 152/76    Pulse: 78 90  88  Temp: 98.5 F (36.9 C) 98.8 F (37.1 C)  98.8 F (37.1 C)  TempSrc: Oral Oral  Oral  Resp: '18 18  18  '$ Height:      Weight:      SpO2: 94% 92%  95%   Body mass index is 25.4 kg/(m^2).  General: A&O x 3, WDWN  Head: [place]/AT  Ear/Nose/Throat: Hearing grossly intact, nares w/o erythema or drainage, oropharynx w/o Erythema/Exudate, Mallampati score: 3  Eyes: PERRLA, EOMI  Neck: Supple, no nuchal rigidity, no palpable LAD  Pulmonary: Sym exp, good air movt, CTAB, no rales, rhonchi, & wheezing  Cardiac: RRR, Nl S1, S2, no Murmurs, rubs or gallops  Vascular: Vessel Right Left  Radial Palpable Palpable  Brachial Palpable Palpable  Carotid Palpable, without bruit Palpable, without bruit  Aorta Not palpable N/A  Femoral Faintly Palpable Not Palpable  Popliteal Not palpable Not palpable  PT Not Palpable Not Palpable  DP Not Palpable Not Palpable   Gastrointestinal: soft, NTND, -G/R, - HSM, - masses, - CVAT B  Musculoskeletal: M/S 5/5 throughout , Extremities without ischemic changes except  R foot: 5th distal phalange with likely dry gangrene, erosion from tip of 5th distal phalange in lateral side of 4th phalange  Neurologic: CN grossly intact , Pain and light touch intact in extremities , Motor exam as listed above  Psychiatric: Judgment intact, Mood & affect appropriate for pt's clinical situation  Dermatologic: See M/S exam for extremity exam, no rashes otherwise noted  Lymph : No Cervical, Axillary, or Inguinal lymphadenopathy    Non-Invasive Vascular Imaging  ABI (Date: 04/02/15)    RIGHT    LEFT    PRESSURE WAVEFORM  PRESSURE WAVEFORM  BRACHIAL 161 Triphasic BRACHIAL 144 Triphasic  DP 43 Dampened Monophasic DP 73 Monophasic  PT 43 Dampened Monophasic PT 66 Monophasic    RIGHT LEFT  ABI 0.27 0.43   ABIs indicate a severe reduction in arterial flow with abnormal Doppler waveforms bilaterally at rest      Laboratory: CBC:    Component Value Date/Time   WBC 8.6 04/03/2015 0356   RBC 3.92* 04/03/2015 0356   RBC 3.92* 04/03/2015 0356   HGB 10.7* 04/03/2015 0356   HCT 35.1* 04/03/2015 0356   PLT 281 04/03/2015 0356   MCV 89.5 04/03/2015 0356   MCH 27.3 04/03/2015 0356   MCHC 30.5 04/03/2015 0356   RDW 14.1 04/03/2015 0356   LYMPHSABS 2.1 04/02/2015 0659   MONOABS 0.8 04/02/2015 0659   EOSABS 0.0 04/02/2015 0659   BASOSABS 0.0 04/02/2015 0659    BMP:    Component Value Date/Time   NA 142 04/03/2015 0356   K 4.4 04/03/2015 0356   CL 106 04/03/2015 0356   CO2 28 04/03/2015 0356   GLUCOSE 145* 04/03/2015 0356   BUN 16 04/03/2015 0356   CREATININE 0.68 04/03/2015 0356   CALCIUM 8.9 04/03/2015 0356   GFRNONAA >60 04/03/2015 0356   GFRAA >60 04/03/2015 0356    Coagulation: Lab Results  Component Value Date   INR 0.96 07/20/2011   No results found for: PTT  Lipids: No results found for: CHOL, TRIG, HDL, CHOLHDL, VLDL, LDLCALC, LDLDIRECT   Radiology: Mr Foot Right W Wo Contrast  04/02/2015  CLINICAL DATA:  Diabetic foot infection.  Fifth digit is black. EXAM: MRI OF THE RIGHT FOREFOOT WITHOUT AND WITH CONTRAST TECHNIQUE: Multiplanar, multisequence MR imaging was performed both  before and after administration of intravenous contrast. CONTRAST:  80m MULTIHANCE GADOBENATE DIMEGLUMINE 529 MG/ML IV SOLN COMPARISON:  None. FINDINGS: Minimal marrow edema in the first distal phalanx without cortical destruction. No other marrow signal abnormality. No acute fracture dislocation. No bone destruction or periosteal reaction. Mild osteoarthritis of the first MTP joint. There is no soft tissue fluid collection or hematoma. There is mild generalized soft tissue edema along the dorsal aspect of the midfoot with mild enhancement which may reflect reactive edema versus mild cellulitis. The muscles are normal. The visualized flexor and extensor tendons are intact. IMPRESSION: 1. No evidence of  osteomyelitis of the right foot. Electronically Signed   By: HKathreen Devoid  On: 04/02/2015 18:44   Dg Foot Complete Right  04/02/2015  CLINICAL DATA:  Right pinky toe is black, with dorsal soft tissue swelling and erythema along the foot. Initial encounter. EXAM: RIGHT FOOT COMPLETE - 3+ VIEW COMPARISON:  None. FINDINGS: There is no evidence of fracture or dislocation. No definite osseous erosion is characterized, though evaluation for osteomyelitis is limited on radiograph. The joint spaces are preserved. There is no evidence of talar subluxation; the subtalar joint is unremarkable in appearance. An os peroneum is seen. Mild dorsal soft tissue swelling is noted. IMPRESSION: 1. No evidence of fracture or dislocation. No definite osseous erosion seen, though evaluation for osteomyelitis is limited on radiograph. 2. Os peroneum seen. Electronically Signed   By: JGarald BaldingM.D.   On: 04/02/2015 06:47    Medical Decision Making  JCornelius Marullois a 60y.o. 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  Given the limb threatening status of this patient, I recommend an aggressive work up including proceeding with an: Aortogram, Bilateral runoff.  I can't really feel a strong femoral pulse, raising the possibility that this patient has significant iliac disease, will obtain BLE arterial duplex to get a better idea if arm access might be necessary for the aortogram. As I can't feel a left femoral pulse, I doubt he is a candidate for immediate intervention. Will get diagnostic studies first and consider endovascular intervention at a different sitting as an antegrade cannulation in a patient with Schizophrenia likely requires general anesthesia.   I discussed with  the patient the nature of angiographic procedures, especially the limited patencies of any endovascular intervention. The patient is aware of that the risks of an angiographic procedure include but are not limited to: bleeding, infection, access site complications, embolization, rupture of treated vessel, dissection, possible need for emergent surgical intervention, and possible need for surgical procedures to treat the patient's pathology. The patient is aware of the risks and agrees to proceed.  The procedure is scheduled for: Monday 13 FEB 17 as there are  no slots on the schedule Friday. Today this patient was very cooperative with obvious intact mental status.  If this changes, he might need to be rescheduled into the OR setting. I discussed in depth with the patient the nature of atherosclerosis, and emphasized the importance of maximal medical management including strict control of blood pressure, blood glucose, and lipid levels, antiplatelet agents, obtaining regular exercise, and cessation of smoking.  The patient is aware that without maximal medical management the underlying atherosclerotic disease  process will progress, limiting the benefit of any interventions. The patient is currently on a statin:  Zocor. The patient is currently not on an anti-platelet. The patient should be started on ASA 81 mg PO daily. Thank you for allowing [place] to participate in this patient's care. I will be on leave this coming Mon and Tuesday.  Dx angiogram will be done on Monday by Dr. Scot Dock.  I will check in on the patient on Wednesday. Continue wound care as directed by WOC.   Adele Barthel, MD [facility] Office: [phone] Pager: [phone]  04/03/2015, 5:46 PM

## 2015-04-23 NOTE — Transfer of Care (Signed)
Immediate Anesthesia Transfer of Care Note  Patient: Craig Bentley  Procedure(s) Performed: Procedure(s): AORTOBIFEMORAL BYPASS GRAFT (Bilateral) Right Common Femoral to Posterior Tibial Bypass with Composite Vein Graft.  (Right) Right Fifth Toe Amputation (Right)  Left Common Femoral Artery to Superficial Femoral Artery Bypass Graft with Vein (Left)  Patient Location: SICU  Anesthesia Type:General  Level of Consciousness: sedated and Patient remains intubated per anesthesia plan  Airway & Oxygen Therapy: Patient remains intubated per anesthesia plan and Patient placed on Ventilator (see vital sign flow sheet for setting)  Post-op Assessment: Report given to RN and Post -op Vital signs reviewed and stable  Post vital signs: Reviewed and stable  Last Vitals:  Filed Vitals:   04/23/15 0704 04/23/15 2021  BP: 160/76   Pulse: 92 81  Temp: 37.1 C   Resp:  12    Complications: No apparent anesthesia complications

## 2015-04-23 NOTE — Consult Note (Signed)
PULMONARY / CRITICAL CARE MEDICINE   Name: Craig Bentley MRN: [id number] DOB: 02-Apr-1955    ADMISSION DATE:  04/23/2015 CONSULTATION DATE:  04/23/15  REFERRING MD:  Bridgett Larsson  CHIEF COMPLAINT:  VDRF post aortobifem bypass  HISTORY OF PRESENT ILLNESS:  Pt is encephelopathic; therefore, this HPI is obtained from chart review. Saliou Barnier is a 60 y.o. male with PMH as outlined below. He was admitted 04/02/15 for poorly healing right foot wound.  During that admission, he was seen by vascular who felt that toe amputation would not be adequate and that pt would not tolerate BKA / AKA due to wound healing issues.  They felt best course would be to proceed with aorto-bifem bypass, right fem-rib bypass and amputation of right fifth toe.  Surgery was not able to be performed that week; therefore, pt was discharged 04/15/15 with plans for vascular to schedule surgery the following week.  On 03/01/, he was admitted for planned surgery.  He had a very prolonged surgery (12 hours) which included aortobifemoral bypass, construction of composite graft, right common femoral artery to posterior tibial artery bypass with composite graft, left common femoral artery to superficial femoral artery bypass with reversed ipsilateral greater saphenous vein, right fifth toe amputation.  Post operatively, he returned to the ICU on the ventilator and PCCM was consulted for vent management.    PAST MEDICAL HISTORY :  He  has a past medical history of Hypertension; Diabetes mellitus; Schizophrenia, schizo-affective ([place]); COPD (chronic obstructive pulmonary disease) ([place]); Constipation; Fecal impaction ([place]); Pneumonia; and Anxiety.  PAST SURGICAL HISTORY: He  has past surgical history that includes Cardiac catheterization (N/A, 04/07/2015).  No Known Allergies  No current facility-administered medications on file prior to encounter.   Current Outpatient Prescriptions on File Prior to Encounter  Medication Sig  .  acetaminophen (TYLENOL) 325 MG tablet Take 650 mg by mouth every 6 (six) hours as needed for mild pain, fever or headache.   . albuterol (PROVENTIL HFA;VENTOLIN HFA) 108 (90 BASE) MCG/ACT inhaler Inhale 2 puffs into the lungs every 4 (four) hours as needed for wheezing or shortness of breath.   Marland Kitchen albuterol (PROVENTIL) (2.5 MG/3ML) 0.083% nebulizer solution Take 3 mLs (2.5 mg total) by nebulization every 4 (four) hours as needed for wheezing.  . cloZAPine (CLOZARIL) 100 MG tablet Take 100-400 mg by mouth 3 (three) times daily. Takes 1 tablet every morning and afternoon Takes 4 tablets at bedtime  . donepezil (ARICEPT) 5 MG tablet Take 5 mg by mouth at bedtime.  . gabapentin (NEURONTIN) 300 MG capsule Take 300 mg by mouth 3 (three) times daily.  . haloperidol (HALDOL) 10 MG tablet Take 10 mg by mouth at bedtime.  . hydrochlorothiazide (HYDRODIURIL) 25 MG tablet Take 25 mg by mouth daily.  Marland Kitchen lactulose (CHRONULAC) 10 GM/15ML solution Take 30 g by mouth 2 (two) times daily.  Marland Kitchen lisinopril (PRINIVIL,ZESTRIL) 20 MG tablet Take 20 mg by mouth daily.  . metFORMIN (GLUCOPHAGE) 500 MG tablet Take 500 mg by mouth 2 (two) times daily with a meal.  . metoprolol tartrate (LOPRESSOR) 25 MG tablet Take 25 mg by mouth 2 (two) times daily.  . Multiple Vitamin (MULITIVITAMIN WITH MINERALS) TABS Take 1 tablet by mouth daily.  Marland Kitchen oxyCODONE-acetaminophen (PERCOCET/ROXICET) 5-325 MG tablet Take 1 tablet by mouth every 3 (three) hours as needed for moderate pain.  . pantoprazole (PROTONIX) 40 MG tablet Take 1 tablet (40 mg total) by mouth daily.  Marland Kitchen senna (SENOKOT) 8.6 MG TABS Take 1 tablet  by mouth 2 (two) times daily.  . simvastatin (ZOCOR) 5 MG tablet Take 5 mg by mouth at bedtime.  . trihexyphenidyl (ARTANE) 5 MG tablet Take 5 mg by mouth 2 (two) times daily with a meal.  . dicyclomine (BENTYL) 20 MG tablet Take 20 mg by mouth 2 (two) times daily as needed for spasms.  Marland Kitchen guaifenesin (ROBITUSSIN) 100 MG/5ML syrup Take  200 mg by mouth 4 (four) times daily as needed for cough.  Marland Kitchen ipratropium-albuterol (DUONEB) 0.5-2.5 (3) MG/3ML SOLN Take 3 mLs by nebulization 3 (three) times daily.  . polyethylene glycol (MIRALAX / GLYCOLAX) packet Take 17 g by mouth daily as needed for mild constipation or moderate constipation.    FAMILY HISTORY:  His has no family status information on file.   SOCIAL HISTORY: He  reports that he has been smoking Cigarettes.  He has a 75 pack-year smoking history. He does not have any smokeless tobacco history on file. He reports that he does not drink alcohol or use illicit drugs.  REVIEW OF SYSTEMS:  Unable to obtain as pt is encephalopathic.  SUBJECTIVE:  On vent.  Was on dopamine and neosynephrine during surgery; however, both now off.   VITAL SIGNS: BP 86/55 mmHg  Pulse 78  Temp(Src) 96.1 F (35.6 C) (Oral)  Resp 12  Ht 5' 11.5" (1.816 m)  Wt 79.833 kg (176 lb)  BMI 24.21 kg/m2  SpO2 100%  HEMODYNAMICS: CO:  [4.4 L/min] 4.4 L/min CI:  [2.2 L/min/m2] 2.2 L/min/m2  VENTILATOR SETTINGS: Vent Mode:  [-] PRVC FiO2 (%):  [40 %] 40 % Set Rate:  [12 bmp] 12 bmp Vt Set:  [610 mL] 610 mL PEEP:  [5 cmH20] 5 cmH20 Plateau Pressure:  [19 cmH20] 19 cmH20  INTAKE / OUTPUT: I/O last 3 completed shifts: In: 7200 [I.V.:6000; Blood:200; IV Piggyback:1000] Out: 2860 [Urine:1960; Blood:900]   PHYSICAL EXAMINATION: General: Middle aged [place] male, chronically ill, in NAD. Neuro: Sedated.  Does not respond / follow commands. HEENT: [place]/AT. PERRL, sclerae anicteric. ETT in place. Cardiovascular: RRR, no M/R/G.  Lungs: Respirations even and unlabored.  CTA bilaterally, No W/R/R. Abdomen: Abd midline dressings C/DI, BS absent, abd soft, NT/ND.  Musculoskeletal: Surgical sites with dressings C/D/I.  No peripheral edema.  Skin: Intact, warm, no rashes.  LABS:  BMET  Recent Labs Lab 04/18/15 0856  NA 137  K 3.8  CL 101  CO2 24  BUN 13  CREATININE 0.84  GLUCOSE 146*     Electrolytes  Recent Labs Lab 04/18/15 0856  CALCIUM 9.8    CBC  Recent Labs Lab 04/18/15 0856  WBC 10.6*  HGB 12.2*  HCT 39.4  PLT 336    Coag's  Recent Labs Lab 04/18/15 0856  APTT 33  INR 1.08    Sepsis Markers No results for input(s): LATICACIDVEN, PROCALCITON, O2SATVEN in the last 168 hours.  ABG  Recent Labs Lab 04/18/15 0856 04/23/15 2048  PHART 7.412 7.381  PCO2ART 43.5 41.4  PO2ART 73.9* 89.0    Liver Enzymes  Recent Labs Lab 04/18/15 0856  AST 21  ALT 21  ALKPHOS 58  BILITOT 0.2*  ALBUMIN 3.7    Cardiac Enzymes No results for input(s): TROPONINI, PROBNP in the last 168 hours.  Glucose  Recent Labs Lab 04/18/15 0821 04/23/15 0708  GLUCAP 147* 144*    Imaging No results found.   STUDIES:  None.  CULTURES: None.  ANTIBIOTICS: Surgical prophylaxis.   SIGNIFICANT EVENTS: 03/01 > to OR for aortobifemoral bypass, construction  of composite graft, right common femoral artery to posterior tibial artery bypass with composite graft, left common femoral artery to superficial femoral artery bypass with reversed ipsilateral greater saphenous vein, right fifth toe amputation > returned to ICU on vent.  LINES/TUBES: ETT 03/01 > R IJ swan 03/01 >   ASSESSMENT / PLAN:  PULMONARY A: VDRF - s/p prolonged surgery for severe PAD. Hx COPD by report. Tobacco use disorder. P:   Full vent support. Wean as able. VAP prevention measures. SBT in AM if able. DuoNebs / Albuterol. Tobacco cessation counseling. CXR in AM.  CARDIOVASCULAR A:  Severe PAD - s/p aortobifemoral bypass, construction of composite graft, right common femoral artery to posterior tibial artery bypass with composite graft, left common femoral artery to superficial femoral artery bypass with reversed ipsilateral greater saphenous vein, right fifth toe amputation 03/01 (Dr. Bridgett Larsson). Hypotension - presumed due to sedation.  Required vasopressor support during  surgery; now off and BP normalized. Hx HTN. P:  Post op care per vascular surgery. Monitor hemodynamics. Continue outpatient simvastatin. Hold outpatient HCTZ, lisinopril, lopressor.  RENAL A:   No known issues. P:   Assess BMP now and correct electrolytes as indicated. BMP in AM.  GASTROINTESTINAL A:   GI prophylaxis. Nutrition. P:   SUP: Pantoprazole. NPO.  HEMATOLOGIC A:   VTE Prophylaxis. P:  SCD's / Lovenox. CBC in AM.  INFECTIOUS A:   No indication of infection. P:   Monitor clinically.  ENDOCRINE A:   DM. P:   SSI. Hold outpatient metformin.  NEUROLOGIC A:   Acute metabolic encephalopathy due to sedation. Hx schizophrenia, anxiety. P:   Sedation:  Propofol gtt / Fentanyl PRN. RASS goal: 0 to -1. Daily WUA. Hold outpatient clozapine, donepezil, gabapentin, haloperidol, percocet.  Family updated: None.  Interdisciplinary Family Meeting v Palliative Care Meeting:  Due by: 03/07.  CC time: 40 minutes.   Montey Hora, [facility] Pager: [phone]  or [phone] 04/23/2015, 9:11 PM

## 2015-04-23 NOTE — Progress Notes (Signed)
Pt did not bring in a urine, instructed to obtain today if possible, voices understanding.

## 2015-04-23 NOTE — Interval H&P Note (Signed)
[facility] [place]  History and Physical Update  The patient was interviewed and re-examined.  The patient's previous History and Physical has been reviewed and is unchanged from my consult except for: interval cardiology and psychiatry consults.  The plan is: aortobifemoral bypass, right common femoral artery to posterior tibial artery bypass with left greater saphenous vein vein, and right 5th toe amputation.    Aortobifemoral bypass:  The patient is aware the risks of aortic surgery include but are not limited to: bleeding, need for transfusion, infection, death, stroke, paralysis, wound complications, bowel injuries, impotence, bowel ischemia, extended ventilation and future ventral hernias.  Overall, I cited a mortality rate of >10% and morbidity rate of 30%.  The risk, benefits, and alternative for bypass operations were discussed with the patient.    Right CFA to PT bypass:  The patient is aware the risks include but are not limited to: bleeding, infection, myocardial infarction, stroke, limb loss, nerve damage, need for additional procedures in the future, wound complications, and inability to complete the bypass.   The patient is aware of these risks and agreed to proceed.   Right 5th toe amputation:  I discussed in depth the nature of right 5th toe with the patient, including risks, benefits, and alternatives.  The patient is aware that the risks of toe amputation include but are not limited to: bleeding, infection, myocardial infarction, stroke, death, failure to heal amputation wound, and possible need for more proximal amputation.  The patient is aware of the risks and agrees proceed forward with the procedure.   Adele Barthel, MD [facility] [place] Office: [phone] Pager: [phone]  04/23/2015, 8:20 AM

## 2015-04-23 NOTE — Interval H&P Note (Signed)
    I had an extended discussion (>30 minutes) with this schizophrenic patient.  Eventually he had a period of lucidity during which I felt he had some understanding of the risks.  He is aware that his operative mortality >10% in this case but this is the only way to avoid a right hip dysarticulation in this case, as I'm not certain he could heal a R AKA.  I doubt a right toe amputation will heal.  Anesthesia's concerns were also addressed.  The patient agreed to proceed forward.  Adele Barthel, MD, FACS [facility] [place] Office: [phone] Pager: [phone]  04/23/2015, 8:23 AM

## 2015-04-23 NOTE — Anesthesia Preprocedure Evaluation (Signed)
Anesthesia Evaluation  Patient identified by MRN, date of birth, ID band Patient awake    Reviewed: Allergy & Precautions, NPO status , Patient's Chart, lab work & pertinent test results  Airway Mallampati: I  TM Distance: >3 FB Neck ROM: Full    Dental   Pulmonary Current Smoker,    Pulmonary exam normal        Cardiovascular hypertension, Pt. on medications Normal cardiovascular exam     Neuro/Psych Anxiety Schizophrenia    GI/Hepatic   Endo/Other  diabetes, Type 2, Oral Hypoglycemic Agents  Renal/GU      Musculoskeletal   Abdominal   Peds  Hematology   Anesthesia Other Findings   Reproductive/Obstetrics                             Anesthesia Physical Anesthesia Plan  ASA: III  Anesthesia Plan: General   Post-op Pain Management:    Induction: Intravenous  Airway Management Planned: Oral ETT  Additional Equipment: Arterial line, CVP, PA Cath and Ultrasound Guidance Line Placement  Intra-op Plan:   Post-operative Plan: Post-operative intubation/ventilation  Informed Consent: I have reviewed the patients History and Physical, chart, labs and discussed the procedure including the risks, benefits and alternatives for the proposed anesthesia with the patient or authorized representative who has indicated his/her understanding and acceptance.     Plan Discussed with: CRNA and Surgeon  Anesthesia Plan Comments:         Anesthesia Quick Evaluation

## 2015-04-24 ENCOUNTER — Encounter (HOSPITAL_COMMUNITY): Payer: Self-pay | Admitting: Vascular Surgery

## 2015-04-24 ENCOUNTER — Inpatient Hospital Stay (HOSPITAL_COMMUNITY): Payer: Medicaid Other

## 2015-04-24 DIAGNOSIS — J9600 Acute respiratory failure, unspecified whether with hypoxia or hypercapnia: Secondary | ICD-10-CM

## 2015-04-24 LAB — COMPREHENSIVE METABOLIC PANEL
ALBUMIN: 2.8 g/dL — AB (ref 3.5–5.0)
ALT: 11 U/L — ABNORMAL LOW (ref 17–63)
ANION GAP: 6 (ref 5–15)
AST: 20 U/L (ref 15–41)
Alkaline Phosphatase: 34 U/L — ABNORMAL LOW (ref 38–126)
BILIRUBIN TOTAL: 0.8 mg/dL (ref 0.3–1.2)
BUN: 8 mg/dL (ref 6–20)
CO2: 26 mmol/L (ref 22–32)
Calcium: 8 mg/dL — ABNORMAL LOW (ref 8.9–10.3)
Chloride: 105 mmol/L (ref 101–111)
Creatinine, Ser: 0.75 mg/dL (ref 0.61–1.24)
GFR calc Af Amer: >60 mL/min (ref >60)
GLUCOSE: 159 mg/dL — AB (ref 65–99)
POTASSIUM: 3.9 mmol/L (ref 3.5–5.1)
Sodium: 137 mmol/L (ref 135–145)
TOTAL PROTEIN: 5.2 g/dL — AB (ref 6.5–8.1)

## 2015-04-24 LAB — GLUCOSE, CAPILLARY
GLUCOSE-CAPILLARY: 151 mg/dL — AB (ref 65–99)
GLUCOSE-CAPILLARY: 176 mg/dL — AB (ref 65–99)
GLUCOSE-CAPILLARY: 96 mg/dL (ref 65–99)
Glucose-Capillary: 133 mg/dL — ABNORMAL HIGH (ref 65–99)
Glucose-Capillary: 141 mg/dL — ABNORMAL HIGH (ref 65–99)
Glucose-Capillary: 103 mg/dL — ABNORMAL HIGH (ref 65–99)

## 2015-04-24 LAB — BLOOD GAS, ARTERIAL
ACID-BASE EXCESS: 1.9 mmol/L (ref 0.0–2.0)
Bicarbonate: 25.6 mEq/L — ABNORMAL HIGH (ref 20.0–24.0)
FIO2: 0.4
O2 Saturation: 98 %
PEEP/CPAP: 5 cmH2O
PH ART: 7.444 (ref 7.350–7.450)
Patient temperature: 98.6
RATE: 12 resp/min
TCO2: 26.8 mmol/L (ref 0–100)
VT: 610 mL
pCO2 arterial: 38 mmHg (ref 35.0–45.0)
pO2, Arterial: 104 mmHg — ABNORMAL HIGH (ref 80.0–100.0)

## 2015-04-24 LAB — CBC
HCT: 30.4 % — ABNORMAL LOW (ref 39.0–52.0)
HEMATOCRIT: 32.3 % — AB (ref 39.0–52.0)
Hemoglobin: 9.9 g/dL — ABNORMAL LOW (ref 13.0–17.0)
Hemoglobin: 9.9 g/dL — ABNORMAL LOW (ref 13.0–17.0)
MCH: 25.9 pg — ABNORMAL LOW (ref 26.0–34.0)
MCH: 27.7 pg (ref 26.0–34.0)
MCHC: 30.7 g/dL (ref 30.0–36.0)
MCHC: 32.6 g/dL (ref 30.0–36.0)
MCV: 84.6 fL (ref 78.0–100.0)
MCV: 84.9 fL (ref 78.0–100.0)
Platelets: 176 10*3/uL (ref 150–400)
Platelets: 180 10*3/uL (ref 150–400)
RBC: 3.82 MIL/uL — ABNORMAL LOW (ref 4.22–5.81)
RBC: 3.58 MIL/uL — AB (ref 4.22–5.81)
RDW: 13.9 % (ref 11.5–15.5)
RDW: 13.8 % (ref 11.5–15.5)
WBC: 13.3 10*3/uL — ABNORMAL HIGH (ref 4.0–10.5)
WBC: 14.2 10*3/uL — AB (ref 4.0–10.5)

## 2015-04-24 LAB — MAGNESIUM: Magnesium: 1.1 mg/dL — ABNORMAL LOW (ref 1.7–2.4)

## 2015-04-24 LAB — AMYLASE: Amylase: 52 U/L (ref 28–100)

## 2015-04-24 LAB — TROPONIN I
Troponin I: <0.03 ng/mL (ref <0.031)
Troponin I: <0.03 ng/mL (ref <0.031)

## 2015-04-24 MED ORDER — LORAZEPAM 2 MG/ML IJ SOLN
1.0000 mg | Freq: Four times a day (QID) | INTRAMUSCULAR | Status: DC
  Administered 2015-04-24: 1 mg via INTRAVENOUS
  Filled 2015-04-24: qty 1

## 2015-04-24 MED ORDER — MORPHINE SULFATE (PF) 2 MG/ML IV SOLN
2.0000 mg | INTRAVENOUS | Status: DC | PRN
  Administered 2015-04-24 – 2015-04-25 (×4): 2 mg via INTRAVENOUS
  Filled 2015-04-24 (×4): qty 1

## 2015-04-24 MED ORDER — LORAZEPAM 2 MG/ML IJ SOLN: 1.0000 mg | Freq: Four times a day (QID) | INTRAMUSCULAR | Status: DC | PRN

## 2015-04-24 MED ORDER — HALOPERIDOL LACTATE 5 MG/ML IJ SOLN: 5.0000 mg | Freq: Once | INTRAMUSCULAR | Status: DC

## 2015-04-24 MED ORDER — HALOPERIDOL LACTATE 5 MG/ML IJ SOLN
5.0000 mg | Freq: Four times a day (QID) | INTRAMUSCULAR | Status: DC | PRN
Start: 2015-04-24 — End: 2015-04-25
  Administered 2015-04-24: 5 mg via INTRAVENOUS
  Filled 2015-04-24: qty 1

## 2015-04-24 MED ORDER — DEXMEDETOMIDINE HCL IN NACL 200 MCG/50ML IV SOLN
0.4000 ug/kg/h | INTRAVENOUS | Status: AC
  Administered 2015-04-24: 0.4 ug/kg/h via INTRAVENOUS
  Administered 2015-04-24 – 2015-04-25 (×5): 0.5 ug/kg/h via INTRAVENOUS
  Filled 2015-04-24 (×6): qty 50

## 2015-04-24 NOTE — Procedures (Signed)
Extubation Procedure Note  Patient Details:   Name: [name] DOB: Jun 09, 1955 MRN: [id number]   Airway Documentation: patient had audible cuff leak prior to extubation, able to follow commands.  Extubated to 4lpm [place] sat 100%, able to state name and talk.  Will continue to monitor.    Evaluation  O2 sats: stable throughout Complications: No apparent complications Patient did tolerate procedure well. Bilateral Breath Sounds: Diminished, Clear Suctioning: Airway Yes  Ned Grace 04/24/2015, 9:00 AM

## 2015-04-24 NOTE — Progress Notes (Signed)
Inpatient Diabetes Program Recommendations  AACE/ADA: New Consensus Statement on Inpatient Glycemic Control (2015)  Target Ranges:  Prepandial:   less than 140 mg/dL      Peak postprandial:   less than 180 mg/dL (1-2 hours)      Critically ill patients:  140 - 180 mg/dL   Review of Glycemic Control  Diabetes history: DM2 Outpatient Diabetes medications: metformin 500 bid Current orders for Inpatient glycemic control: Novolog moderate Q4H  Results for Craig Bentley, Craig Bentley (MRN [id number]) as of 04/24/2015 13:07  Ref. Range 04/23/2015 21:30 04/23/2015 23:50 04/24/2015 04:03 04/24/2015 08:22 04/24/2015 12:19  Glucose-Capillary Latest Ref Range: 65-99 mg/dL 216 (H) 176 (H) 133 (H) 96 141 (H)  Results for Craig Bentley, Craig Bentley (MRN [id number]) as of 04/24/2015 13:07  Ref. Range 04/18/2015 08:56  Hemoglobin A1C Latest Ref Range: 4.8-5.6 % 6.4 (H)     Inpatient Diabetes Program Recommendations:    When diet is advanced to CHO mod med, change Novolog moderate to tidwc and hs.  Will continue to follow. Thank you. Lorenda Peck, RD, LDN, CDE Inpatient Diabetes Coordinator [phone]

## 2015-04-24 NOTE — Progress Notes (Signed)
OT Cancellation Note  Patient Details Name: Craig Bentley MRN: [id number] DOB: 08/29/1955   Cancelled Treatment:    Reason Eval/Treat Not Completed: Patient not medically ready. Pt recently extubated. Will reattempt.  [name] 04/24/2015, 9:37 AM  [phone]

## 2015-04-24 NOTE — Progress Notes (Addendum)
     Subjective  - Intubated   Objective 122/74 129 100 F (37.8 C) (Core (Comment)) 30 100%  Intake/Output Summary (Last 24 hours) at 04/24/15 0726 Last data filed at 04/24/15 0700  Gross per 24 hour  Intake 9272.88 ml  Output   3850 ml  Net 5422.88 ml    Doppler signals bilateral right PT, left pt/peroneal All incisions soft and dressings clean and dry No BS, abdomin distended, compressible Heart RRR Lungs intubated   Assessment/Planning: POD # 1  PROCEDURE: 1. Aortobifemoral bypass (14 x 8 mm) 2. Construction of composite graft 3. Right common femoral artery to posterior tibial artery with composite graft 4. Left common femoral artery to superficial femoral artery bypass with reversed ipsilateral greater saphenous vein  5. Right fifth toe amputation Troponin's negative EKG pending today for results, Off neo and dopamine Tm 100.4, WBC 13.3, BP stable over night 122/74, HR 129 UO 100 cc/hr average NG tube in place out put 150 shift  Laurence Slate [facility] 04/24/2015 7:26 AM --  Laboratory Lab Results:  Recent Labs  04/24/15 0020 04/24/15 0405  WBC 14.2* 13.3*  HGB 9.9* 9.9*  HCT 30.4* 32.3*  PLT 176 180   BMET  Recent Labs  04/23/15 2050 04/24/15 0405  NA 137 137  K 4.0 3.9  CL 101 105  CO2 24 26  GLUCOSE 250* 159*  BUN 8 8  CREATININE 0.73 0.75  CALCIUM 8.1* 8.0*    COAG Lab Results  Component Value Date   INR 1.30 04/23/2015   INR 1.08 04/18/2015   INR 1.09 04/12/2015   No results found for: PTT  Addendum  I have independently interviewed and examined the patient, and I agree with the physician assistant's findings.  Pt incisions are c/d/i without active bleeding.  Right dopplerable signals (PT, AT) multiphasic.  Left doppler signals (PT>AT) greatly improved.  HD stable.  ABG looks good.  Suspect he can be extubated today.  Once extubated pull swan.  Keep foley and NGT until ileus resolved.  This patient is going to need a bowel  regimen.  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    Adele Barthel, MD [facility] [place] Office: [phone] Pager: [phone]  04/24/2015, 8:30 AM

## 2015-04-24 NOTE — Progress Notes (Signed)
PULMONARY / CRITICAL CARE MEDICINE   Name: Craig Bentley MRN: [id number] DOB: 08/17/1955    ADMISSION DATE:  04/23/2015 CONSULTATION DATE:  04/23/2015  REFERRING MD:  Dr. Bridgett Larsson  CHIEF COMPLAINT:  Leg pain  SUBJECTIVE:  Tolerating pressure support  VITAL SIGNS: BP 163/61 mmHg  Pulse 115  Temp(Src) 100 F (37.8 C) (Core (Comment))  Resp 19  Ht 5' 11.5" (1.816 m)  Wt 183 lb 6.8 oz (83.2 kg)  BMI 25.23 kg/m2  SpO2 100%  HEMODYNAMICS: PAP: (23-33)/(10-18) 23/14 mmHg CO:  [4.4 L/min-6.6 L/min] 6.6 L/min CI:  [2.2 L/min/m2-3.3 L/min/m2] 3.3 L/min/m2  VENTILATOR SETTINGS: Vent Mode:  [-] PSV;CPAP FiO2 (%):  [40 %] 40 % Set Rate:  [12 bmp] 12 bmp Vt Set:  [610 mL] 610 mL PEEP:  [5 cmH20] 5 cmH20 Pressure Support:  [5 cmH20] 5 cmH20 Plateau Pressure:  [19 cmH20-20 cmH20] 20 cmH20  INTAKE / OUTPUT: I/O last 3 completed shifts: In: 9272.9 [I.V.:7842.9; Blood:200; NG/GT:180; IV [id number]:8338] Out: 2505 [Urine:2800; Emesis/NG output:150; Blood:900]  PHYSICAL EXAMINATION: General:  On vent Neuro:  Follows some commands HEENT:  ETT in place Cardiovascular:  Regular, no murmur Lungs:  No wheeze Abdomen:  Soft, non tender Musculoskeletal:  Rt lower leg in wrap Skin:  Dry gangrene Rt foot  LABS:  BMET  Recent Labs Lab 04/18/15 0856 04/23/15 2050 04/24/15 0405  NA 137 137 137  K 3.8 4.0 3.9  CL 101 101 105  CO2 '24 24 26  '$ BUN '13 8 8  '$ CREATININE 0.84 0.73 0.75  GLUCOSE 146* 250* 159*    Electrolytes  Recent Labs Lab 04/18/15 0856 04/23/15 2050 04/24/15 0405  CALCIUM 9.8 8.1* 8.0*  MG  --  1.0* 1.1*    CBC  Recent Labs Lab 04/23/15 2050 04/24/15 0020 04/24/15 0405  WBC 13.8* 14.2* 13.3*  HGB 9.5* 9.9* 9.9*  HCT 30.6* 30.4* 32.3*  PLT 192 176 180    Coag's  Recent Labs Lab 04/18/15 0856 04/23/15 2050  APTT 33 35  INR 1.08 1.30    ABG  Recent Labs Lab 04/18/15 0856 04/23/15 2048 04/24/15 0406  PHART 7.412 7.381 7.444  PCO2ART 43.5  41.4 38.0  PO2ART 73.9* 89.0 104*    Liver Enzymes  Recent Labs Lab 04/18/15 0856 04/24/15 0405  AST 21 20  ALT 21 11*  ALKPHOS 58 34*  BILITOT 0.2* 0.8  ALBUMIN 3.7 2.8*    Cardiac Enzymes  Recent Labs Lab 04/23/15 2050 04/24/15 0405  TROPONINI <0.03 <0.03    Glucose  Recent Labs Lab 04/18/15 0821 04/23/15 0708 04/23/15 2130 04/23/15 2350 04/24/15 0403  GLUCAP 147* 144* 216* 176* 133*    Imaging Dg Chest Port 1 View  04/24/2015  CLINICAL DATA:  Postoperative respiratory failure following progressed peripheral vascular procedure EXAM: PORTABLE CHEST 1 VIEW COMPARISON:  Portable chest x-ray of April 23, 2015 FINDINGS: The lungs are mildly hypoinflated. There are infiltrates at both bases. The cardiac silhouette is top-normal in size. The pulmonary vascularity is less engorged and more distinct today. The endotracheal tube tip lies 4.2 cm above the carina. The esophagogastric tube tip projects below the inferior margin of the image. The Swan-Ganz catheter tip projects in the proximal main pulmonary artery outflow tract. IMPRESSION: Slight interval improvement in bibasilar atelectasis or pneumonia. Decreased pulmonary vascular congestion. The support tubes are in reasonable position. Electronically Signed   By: David  Martinique M.D.   On: 04/24/2015 07:48   Dg Chest Port 1 View  04/23/2015  CLINICAL  DATA:  Postop patient.  Status post aortic bypass. EXAM: PORTABLE CHEST 1 VIEW COMPARISON:  02/19/2015 FINDINGS: Endotracheal tube tip projects 3.2 cm above the carina. Orogastric tube passes below the diaphragm and below the included field of view. Right internal jugular Swan-Ganz catheter tip projects in the main pulmonary artery. There is hazy perihilar and lung base airspace opacity. This is likely atelectasis. No pneumothorax. Cardiac silhouette is normal in size. No mediastinal or hilar masses. IMPRESSION: 1. Support apparatus is well positioned as described. 2. Perihilar and lung  base hazy airspace opacity. This is most likely atelectasis. Electronically Signed   By: Lajean Manes M.D.   On: 04/23/2015 21:44   Dg Abd Portable 1v  04/23/2015  CLINICAL DATA:  60 year old male with history of aortic bypass procedure. EXAM: PORTABLE ABDOMEN - 1 VIEW COMPARISON:  Abdominal radiograph 09/16/2013. FINDINGS: Gas and stool are noted throughout the colon extending to the level of the distal rectum. No pathologic dilatation of small bowel. No overt pneumoperitoneum confidently identified on today's single portable supine view. Midline skin staples. Nasogastric screw projecting over the stomach. IMPRESSION: 1. Nonobstructive bowel gas pattern. 2. No pneumoperitoneum. 3. Postoperative changes and support apparatus, as above. Electronically Signed   By: Vinnie Langton M.D.   On: 04/23/2015 21:43     STUDIES:   CULTURES:  ANTIBIOTICS:  SIGNIFICANT EVENTS: 3/01 Admit, aortobifemoral bypass, Rt common femoral to posterior tibial bypass, Rt fifth toe amputation  LINES/TUBES: 3/01 Rt IJ Introducer >> 3/01 ETT >> 3/02  3/01 Rt radial aline >>  DISCUSSION: 60 yo male smoker with non healing Rt foot wound and PAD.  He had aortobifemoral bypass, Rt common femoral to posterior tibial bypass, Rt fifth toe amputation on 3/01.  He remained on vent post op and PCCM consulted.  He has PMHx of HTN, HLD, DM, COPD, Schizophrenia, Anxiety  ASSESSMENT / PLAN:  PULMONARY A: Acute respiratory failure. Hx of COPD. P:   Proceed with extubation 3/02 Continue duoneb  CARDIOVASCULAR A:  S/p aortobifemoral bypass, Rt common femoral to posterior tibial bypass, Rt fifth toe amputation 3/01. Hx of HTN, HLD. P:  Post op care per [facility] HCTZ, lisinopril, lopressor Continue zocor  RENAL A:   No acute issues. P:   Monitor renal fx, urine outpt  GASTROINTESTINAL A:   Nutrition. P:   NPO Advance diet per [facility] Protonix for SUP  HEMATOLOGIC A:   Anemia. P:  F/u CBC Lovenox for DVT  prophylaxis  INFECTIOUS A:   Post op prophylaxis P:   Zinacef per [facility]  ENDOCRINE A:   DM type II P:   SSI Hold outpt glucophage  NEUROLOGIC A:   Hx of schizophrenia, anxiety P:   Resume clozaril, aricept, neurontin, haldol, artane, bentyl when able to take po meds   CC time 34 minutes.  Chesley Mires, MD [facility] Pulmonary/Critical Care 04/24/2015, 8:24 AM Pager:  [phone] After 3pm call: [phone]

## 2015-04-24 NOTE — Progress Notes (Signed)
Utilization Review Completed.  

## 2015-04-24 NOTE — Evaluation (Signed)
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 No s/p prolonged vascular procedures with multiple bypass and amputation of R 5th toe. PMH: COPD, HTN, schizophrenia, NIIDM.    Clinical Impression   Pt reports ambulating with a walker and receiving assistance for showering at ALF, otherwise independent. Pt presents with confusion, generalized weakness, lethargy, pain which is well managed and impaired balance. He needs assistance for all ADL and +2 assistance for ambulation for safety and to follow with chair.  Will follow acutely. Currently recommending SNF, but pt may progress well enough to return to ALF.    Follow Up Recommendations  SNF;Supervision/Assistance - 24 hour    Equipment Recommendations       Recommendations for Other Services       Precautions / Restrictions Precautions Precautions: Fall Precaution Comments: watch HR Restrictions Weight Bearing Restrictions: No      Mobility Bed Mobility Overal bed mobility: +2 for physical assistance;Needs Assistance Bed Mobility: Sit to Sidelying         Sit to sidelying: +2 for physical assistance;Mod assist    Transfers Overall transfer level: Needs assistance Equipment used: Rolling walker (2 wheeled) Transfers: Sit to/from Stand Sit to Stand: Mod assist;+2 safety/equipment         General transfer comment: verbal and tactile cues for hand placement, assist to rise and gain balance, assist to control descent     Balance Overall balance assessment: Needs assistance Sitting-balance support: Feet supported Sitting balance-Leahy Scale: Fair       Standing balance-Leahy Scale: Poor                              ADL Overall ADL's : Needs assistance/impaired Eating/Feeding: NPO   Grooming: Wash/dry hands;Wash/dry face;Minimal  assistance;Sitting   Upper Body Bathing: Maximal assistance;Sitting   Lower Body Bathing: Total assistance;Sit to/from stand;+2 for safety/equipment   Upper Body Dressing : Moderate assistance;Sitting   Lower Body Dressing: +2 for safety/equipment;Total assistance;Sit to/from stand   Toilet Transfer: +2 for safety/equipment;Moderate assistance;Ambulation;RW   Toileting- Clothing Manipulation and Hygiene: Total assistance;+2 for physical assistance;Sit to/from stand       Functional mobility during ADLs: Moderate assistance;+2 for safety/equipment;Rolling walker (chair following, assist to guide walker and for balance)       Vision     Perception     Praxis      Pertinent Vitals/Pain Pain Assessment: Faces Faces Pain Scale: Hurts a little bit Pain Location: abdomen Pain Descriptors / Indicators: Sore     Hand Dominance Right   Extremity/Trunk Assessment Upper Extremity Assessment Upper Extremity Assessment: Overall WFL for tasks assessed   Lower Extremity Assessment Lower Extremity Assessment: Defer to PT evaluation       Communication Communication Communication: No difficulties   Cognition Arousal/Alertness: Lethargic Behavior During Therapy: Impulsive;Flat affect Overall Cognitive Status: Impaired/Different from baseline Area of Impairment: Orientation;Attention;Safety/judgement;Memory;Following commands Orientation Level: Disoriented to;Time;Situation Current Attention Level: Sustained Memory: Decreased short-term memory Following Commands: Follows one step commands inconsistently;Follows one step commands with increased time (with multimodal cues) Safety/Judgement: Decreased awareness of safety;Decreased awareness of deficits     General Comments: pt keeping his eyes closed much of session   General Comments       Exercises       Shoulder Instructions      Home  Living Family/patient expects to be discharged to:: Assisted living [facility]) Living Arrangements: Non-relatives/Friends;Other (Comment) (ALF)                           Home Equipment: Walker - 4 wheels;Shower seat   Additional Comments: ramp to enter      Prior Functioning/Environment Level of Independence: Needs assistance  Gait / Transfers Assistance Needed: uses a walker with a seat ADL's / Homemaking Assistance Needed: reports he can dress himself and has assistance for showering   Comments: questionable historian, pt with AMS    OT Diagnosis: Generalized weakness;Cognitive deficits;Acute pain   OT Problem List: Decreased strength;Decreased activity tolerance;Impaired balance (sitting and/or standing);Decreased cognition;Decreased safety awareness;Decreased knowledge of use of DME or AE;Pain   OT Treatment/Interventions: Self-care/ADL training;DME and/or AE instruction;Therapeutic activities;Patient/family education;Balance training    OT Goals(Current goals can be found in the care plan section) Acute Rehab OT Goals Patient Stated Goal: go home OT Goal Formulation: Patient unable to participate in goal setting Time For Goal Achievement: 05/08/15 Potential to Achieve Goals: Good ADL Goals Pt Will Perform Grooming: with supervision;standing Pt Will Perform Upper Body Dressing: with supervision;sitting Pt Will Perform Lower Body Dressing: with supervision;with adaptive equipment;sit to/from stand Pt Will Transfer to Toilet: with supervision;ambulating;bedside commode (over toilet) Pt Will Perform Toileting - Clothing Manipulation and hygiene: with supervision;sit to/from stand  OT Frequency: Min 2X/week   Barriers to D/C:            Co-evaluation PT/OT/SLP Co-Evaluation/Treatment: Yes Reason for Co-Treatment: For patient/therapist safety;Necessary to address cognition/behavior during functional activity;Complexity of the patient's impairments (multi-system involvement)   OT goals addressed during session: ADL's and self-care       End of Session Equipment Utilized During Treatment: Gait belt;Rolling walker  Activity Tolerance: Patient tolerated treatment well Patient left: in bed;with call bell/phone within reach;with bed alarm set   Time: 1344-1421 OT Time Calculation (min): 37 min Charges:  OT General Charges $OT Visit: 1 Procedure OT Evaluation $OT Eval Moderate Complexity: 1 Procedure OT Treatments $Therapeutic Activity: 8-22 mins G-Codes:    [name] 04/24/2015, 2:36 PM  [phone]

## 2015-04-24 NOTE — Progress Notes (Signed)
Pt agitated and pulling at tubes.  Not improved with IV haldol.  Unable to take pills at this time.  Will add precedex.  Chesley Mires, MD [facility] Pulmonary/Critical Care 04/24/2015, 1:22 PM Pager:  [phone] After 3pm call: [phone]

## 2015-04-24 NOTE — Evaluation (Signed)
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 No s/p prolonged vascular procedures with multiple bypass and amputation of R 5th toe. PMH: COPD, HTN, schizophrenia, NIIDM.   Clinical Impression  Patient presents with lethargy, decreased strength, pain, decreased balance, confusion and overall mobility s/p above surgery. Tolerated ambulation with Min A of 2 for balance and safety with chair follow due to lethargy, impulsivity and safety concerns. Currently recommending ST SNF to maximize mobility and function, however pending progress, may be appropriate to return to ALF. Will follow acutely.     Follow Up Recommendations SNF    Equipment Recommendations  None recommended by PT    Recommendations for Other Services OT consult     Precautions / Restrictions Precautions Precautions: Fall Precaution Comments: watch HR Restrictions Weight Bearing Restrictions: No      Mobility  Bed Mobility Overal bed mobility: +2 for physical assistance;Needs Assistance Bed Mobility: Sit to Sidelying         Sit to sidelying: +2 for physical assistance;Mod assist General bed mobility comments: Assist to bring BLEs into bed. Cues for log roll technique.  Transfers Overall transfer level: Needs assistance Equipment used: Rolling walker (2 wheeled) Transfers: Sit to/from Stand Sit to Stand: Mod assist;+2 safety/equipment         General transfer comment: verbal and tactile cues for hand placement, assist to rise and gain balance, assist to control descent. Progressed to Min A on second stand. manual cues for hand placement.  Ambulation/Gait Ambulation/Gait assistance: Min assist;+2 safety/equipment Ambulation Distance (Feet): 75 Feet (x2 bouts) Assistive device: Rolling walker (2 wheeled) Gait Pattern/deviations:  Step-through pattern;Decreased stride length;Trunk flexed Gait velocity: decreased Gait velocity interpretation: Below normal speed for age/gender General Gait Details: Assist to navigate RW and for balance as pt's eyes closed for much of session. Short shuffling steps initially progressing to larger step lengths with cues. 1 seated rest break. HR up to 128 bpm.   Stairs            Wheelchair Mobility    Modified Rankin (Stroke Patients Only)       Balance Overall balance assessment: Needs assistance Sitting-balance support: Feet supported;Bilateral upper extremity supported Sitting balance-Leahy Scale: Fair     Standing balance support: During functional activity;Bilateral upper extremity supported Standing balance-Leahy Scale: Poor Standing balance comment: Reliant on BUE support for balance.                             Pertinent Vitals/Pain Pain Assessment: Faces Pain Score: 3  Faces Pain Scale: Hurts a little bit Pain Location: abdomen Pain Descriptors / Indicators: Sore Pain Intervention(s): Monitored during session;Repositioned    Home Living Family/patient expects to be discharged to:: Assisted living [facility]) Living Arrangements: Non-relatives/Friends;Other (Comment) (ALF)             Home Equipment: Walker - 4 wheels;Shower seat Additional Comments: ramp to enter    Prior Function Level of Independence: Needs assistance   Gait / Transfers Assistance Needed: uses a walker with a seat  ADL's / Homemaking Assistance Needed: reports he can dress himself and has assistance for showering  Comments: questionable historian, pt with AMS     Hand Dominance   Dominant Hand: Right    Extremity/Trunk Assessment   Upper Extremity Assessment: Defer to OT evaluation  Lower Extremity Assessment: Generalized weakness         Communication   Communication: No difficulties  Cognition Arousal/Alertness: Lethargic Behavior  During Therapy: Flat affect;Impulsive Overall Cognitive Status: Impaired/Different from baseline Area of Impairment: Safety/judgement;Orientation;Attention Orientation Level: Disoriented to;Time;Situation Current Attention Level: Sustained Memory: Decreased short-term memory Following Commands: Follows one step commands inconsistently;Follows one step commands with increased time (multimodal cues) Safety/Judgement: Decreased awareness of safety;Decreased awareness of deficits     General Comments: pt keeping his eyes closed much of session.     General Comments General comments (skin integrity, edema, etc.): RN assisted with chair follow due to lethargy.    Exercises        Assessment/Plan    PT Assessment Patient needs continued PT services  PT Diagnosis Difficulty walking;Generalized weakness   PT Problem List Decreased strength;Pain;Decreased balance;Decreased mobility;Decreased activity tolerance;Decreased knowledge of precautions;Decreased cognition;Cardiopulmonary status limiting activity  PT Treatment Interventions Balance training;Gait training;Functional mobility training;Therapeutic activities;Therapeutic exercise;Patient/family education;DME instruction   PT Goals (Current goals can be found in the Care Plan section) Acute Rehab PT Goals Patient Stated Goal: go home PT Goal Formulation: With patient Time For Goal Achievement: 05/08/15 Potential to Achieve Goals: Good    Frequency Min 3X/week   Barriers to discharge Decreased caregiver support      Co-evaluation PT/OT/SLP Co-Evaluation/Treatment: Yes Reason for Co-Treatment: Complexity of the patient's impairments (multi-system involvement);Necessary to address cognition/behavior during functional activity;For patient/therapist safety   OT goals addressed during session: ADL's and self-care       End of Session Equipment Utilized During Treatment: Gait belt Activity Tolerance: Patient tolerated treatment  well Patient left: in bed;with call bell/phone within reach;with bed alarm set;with restraints reapplied Nurse Communication: Mobility status         Time: 1356-1416 PT Time Calculation (min) (ACUTE ONLY): 20 min   Charges:   PT Evaluation $PT Eval Moderate Complexity: 1 Procedure     PT G Codes:        Kamillah Didonato A Shykeria Sakamoto 04/24/2015, 3:34 PM  Mylo Red, PT, DPT [phone]

## 2015-04-24 NOTE — Progress Notes (Signed)
Patient in chair. States he needs to throw up and is violently attempting to  Vomit. I suspect this is just phlegm and he just does not understand this whole process. He pulled his NG out and is very restless and throwing his body over the side of the chair to  "vomit". Dr. Bridgett Larsson and Dr. Halford Chessman notified. He did calm down some with the Haldol and Ativan that was given but was still on edge and needed someone to sit with him even though he had a safety waist belt on. Precedex started at .38mg  And he is much more calm and cooperative now. He even ambulated with PT/OT in the hall. Now back in bed and resting quietly.

## 2015-04-25 DIAGNOSIS — R410 Disorientation, unspecified: Secondary | ICD-10-CM

## 2015-04-25 DIAGNOSIS — F203 Undifferentiated schizophrenia: Secondary | ICD-10-CM

## 2015-04-25 LAB — POCT I-STAT 7, (LYTES, BLD GAS, ICA,H+H)
Acid-Base Excess: 3 mmol/L — ABNORMAL HIGH (ref 0.0–2.0)
Acid-Base Excess: 1 mmol/L (ref 0.0–2.0)
BICARBONATE: 24.8 meq/L — AB (ref 20.0–24.0)
BICARBONATE: 24.9 meq/L — AB (ref 20.0–24.0)
Bicarbonate: 27.3 mEq/L — ABNORMAL HIGH (ref 20.0–24.0)
CALCIUM ION: 1.09 mmol/L — AB (ref 1.13–1.30)
CALCIUM ION: 1.07 mmol/L — AB (ref 1.13–1.30)
Calcium, Ion: 1.13 mmol/L (ref 1.13–1.30)
HCT: 31 % — ABNORMAL LOW (ref 39.0–52.0)
HEMATOCRIT: 30 % — AB (ref 39.0–52.0)
HEMATOCRIT: 31 % — AB (ref 39.0–52.0)
HEMOGLOBIN: 10.2 g/dL — AB (ref 13.0–17.0)
Hemoglobin: 10.5 g/dL — ABNORMAL LOW (ref 13.0–17.0)
Hemoglobin: 10.5 g/dL — ABNORMAL LOW (ref 13.0–17.0)
O2 SAT: 97 %
O2 SAT: 99 %
O2 Saturation: 98 %
PCO2 ART: 34.5 mmHg — AB (ref 35.0–45.0)
PH ART: 7.454 — AB (ref 7.350–7.450)
POTASSIUM: 3.8 mmol/L (ref 3.5–5.1)
POTASSIUM: 3.3 mmol/L — AB (ref 3.5–5.1)
Patient temperature: 34.8
Potassium: 3.4 mmol/L — ABNORMAL LOW (ref 3.5–5.1)
SODIUM: 139 mmol/L (ref 135–145)
Sodium: 138 mmol/L (ref 135–145)
Sodium: 139 mmol/L (ref 135–145)
TCO2: 28 mmol/L (ref 0–100)
TCO2: 26 mmol/L (ref 0–100)
TCO2: 26 mmol/L (ref 0–100)
pCO2 arterial: 35.4 mmHg (ref 35.0–45.0)
pCO2 arterial: 34.7 mmHg — ABNORMAL LOW (ref 35.0–45.0)
pH, Arterial: 7.489 — ABNORMAL HIGH (ref 7.350–7.450)
pH, Arterial: 7.456 — ABNORMAL HIGH (ref 7.350–7.450)
pO2, Arterial: 77 mmHg — ABNORMAL LOW (ref 80.0–100.0)
pO2, Arterial: 100 mmHg (ref 80.0–100.0)
pO2, Arterial: 141 mmHg — ABNORMAL HIGH (ref 80.0–100.0)

## 2015-04-25 LAB — GLUCOSE, CAPILLARY
Glucose-Capillary: 120 mg/dL — ABNORMAL HIGH (ref 65–99)
Glucose-Capillary: 121 mg/dL — ABNORMAL HIGH (ref 65–99)
Glucose-Capillary: 133 mg/dL — ABNORMAL HIGH (ref 65–99)
Glucose-Capillary: 145 mg/dL — ABNORMAL HIGH (ref 65–99)
Glucose-Capillary: 151 mg/dL — ABNORMAL HIGH (ref 65–99)
Glucose-Capillary: 156 mg/dL — ABNORMAL HIGH (ref 65–99)

## 2015-04-25 LAB — MAGNESIUM: MAGNESIUM: 1.5 mg/dL — AB (ref 1.7–2.4)

## 2015-04-25 LAB — CBC
HCT: 26 % — ABNORMAL LOW (ref 39.0–52.0)
Hemoglobin: 8.2 g/dL — ABNORMAL LOW (ref 13.0–17.0)
MCH: 26.4 pg (ref 26.0–34.0)
MCHC: 31.5 g/dL (ref 30.0–36.0)
MCV: 83.6 fL (ref 78.0–100.0)
PLATELETS: 150 10*3/uL (ref 150–400)
RBC: 3.11 MIL/uL — ABNORMAL LOW (ref 4.22–5.81)
RDW: 14 % (ref 11.5–15.5)
WBC: 16 10*3/uL — ABNORMAL HIGH (ref 4.0–10.5)

## 2015-04-25 LAB — BASIC METABOLIC PANEL
Anion gap: 10 (ref 5–15)
BUN: 10 mg/dL (ref 6–20)
CALCIUM: 7.9 mg/dL — AB (ref 8.9–10.3)
CHLORIDE: 103 mmol/L (ref 101–111)
CO2: 25 mmol/L (ref 22–32)
CREATININE: 0.72 mg/dL (ref 0.61–1.24)
GFR calc Af Amer: >60 mL/min (ref >60)
GFR calc non Af Amer: >60 mL/min (ref >60)
Glucose, Bld: 139 mg/dL — ABNORMAL HIGH (ref 65–99)
Potassium: 3.4 mmol/L — ABNORMAL LOW (ref 3.5–5.1)
Sodium: 138 mmol/L (ref 135–145)

## 2015-04-25 MED ORDER — POTASSIUM CHLORIDE 10 MEQ/100ML IV SOLN
10.0000 meq | INTRAVENOUS | Status: AC
  Administered 2015-04-25 (×3): 10 meq via INTRAVENOUS
  Filled 2015-04-25 (×3): qty 100

## 2015-04-25 MED ORDER — HALOPERIDOL LACTATE 5 MG/ML IJ SOLN: 2.5000 mg | Freq: Four times a day (QID) | INTRAMUSCULAR | Status: DC | PRN

## 2015-04-25 MED ORDER — DEXTROSE-NACL 5-0.45 % IV SOLN
INTRAVENOUS | Status: DC
  Administered 2015-04-25: 75 mL/h via INTRAVENOUS
  Administered 2015-04-26: 17:00:00 via INTRAVENOUS
  Administered 2015-04-26: 1000 mL via INTRAVENOUS

## 2015-04-25 MED ORDER — DEXMEDETOMIDINE HCL IN NACL 200 MCG/50ML IV SOLN
0.4000 ug/kg/h | INTRAVENOUS | Status: DC
  Administered 2015-04-25 (×2): 0.6 ug/kg/h via INTRAVENOUS
  Administered 2015-04-26: 0.4 ug/kg/h via INTRAVENOUS
  Administered 2015-04-26 (×3): 0.6 ug/kg/h via INTRAVENOUS
  Filled 2015-04-25 (×4): qty 50

## 2015-04-25 NOTE — Progress Notes (Signed)
Fentanyl drip 225 ml flushed down sink. Witnessed by Currie Paris, RN

## 2015-04-25 NOTE — Progress Notes (Signed)
     Spoke with pharmacy in regards to psych medication needs while NPO.  We will order Haldol 2.5 IV q 6.  Skiler Olden MAUREEN PA-C

## 2015-04-25 NOTE — Progress Notes (Signed)
PULMONARY / CRITICAL CARE MEDICINE   Name: Craig Bentley MRN: [id number] DOB: 1955-06-04    ADMISSION DATE:  04/23/2015 CONSULTATION DATE:  04/23/2015  REFERRING MD:  Dr. Bridgett Larsson  CHIEF COMPLAINT:  Leg pain  SUBJECTIVE:  Slept well overnight.  Denies chest or abdominal pain.  VITAL SIGNS: BP 124/63 mmHg  Pulse 87  Temp(Src) 99.1 F (37.3 C) (Axillary)  Resp 26  Ht 5' 11.5" (1.816 m)  Wt 183 lb 6.8 oz (83.2 kg)  BMI 25.23 kg/m2  SpO2 99%  INTAKE / OUTPUT: I/O last 3 completed shifts: In: 3766.4 [I.V.:3486.4; NG/GT:180; IV Piggyback:100] Out: 2155 [Urine:2005; Emesis/NG output:150]  PHYSICAL EXAMINATION: General:  pleasant Neuro:  Follows some commands HEENT:  No stridor Cardiovascular:  Regular, no murmur Lungs:  No wheeze Abdomen:  Soft, non tender Musculoskeletal:  Wound sites clean Skin: no rashes  LABS:  BMET  Recent Labs Lab 04/23/15 2050 04/24/15 0405 04/25/15 0426  NA 137 137 138  K 4.0 3.9 3.4*  CL 101 105 103  CO2 '24 26 25  '$ BUN '8 8 10  '$ CREATININE 0.73 0.75 0.72  GLUCOSE 250* 159* 139*    Electrolytes  Recent Labs Lab 04/23/15 2050 04/24/15 0405 04/25/15 0426  CALCIUM 8.1* 8.0* 7.9*  MG 1.0* 1.1* 1.5*    CBC  Recent Labs Lab 04/24/15 0020 04/24/15 0405 04/25/15 0426  WBC 14.2* 13.3* 16.0*  HGB 9.9* 9.9* 8.2*  HCT 30.4* 32.3* 26.0*  PLT 176 180 150    Coag's  Recent Labs Lab 04/23/15 2050  APTT 35  INR 1.30    ABG  Recent Labs Lab 04/23/15 2048 04/24/15 0406  PHART 7.381 7.444  PCO2ART 41.4 38.0  PO2ART 89.0 104*    Liver Enzymes  Recent Labs Lab 04/24/15 0405  AST 20  ALT 11*  ALKPHOS 34*  BILITOT 0.8  ALBUMIN 2.8*    Cardiac Enzymes  Recent Labs Lab 04/23/15 2050 04/24/15 0405 04/24/15 0859  TROPONINI <0.03 <0.03 <0.03    Glucose  Recent Labs Lab 04/24/15 0822 04/24/15 1219 04/24/15 1537 04/24/15 1931 04/24/15 2357 04/25/15 0359  GLUCAP 96 141* 151* 103* 133* 120*    Imaging No  results found.   STUDIES:   CULTURES:  ANTIBIOTICS:  SIGNIFICANT EVENTS: 3/01 Admit, aortobifemoral bypass, Rt common femoral to posterior tibial bypass, Rt fifth toe amputation 3/02 Extubated; agitated after extubation >> improved with precedex  LINES/TUBES: 3/01 Rt IJ Introducer >> 3/01 ETT >> 3/02   3/01 Rt radial aline >> 3/02  DISCUSSION: 60 yo male smoker with non healing Rt foot wound and PAD.  He had aortobifemoral bypass, Rt common femoral to posterior tibial bypass, Rt fifth toe amputation on 3/01.  He remained on vent post op and PCCM consulted.  He has PMHx of HTN, HLD, DM, COPD, Schizophrenia, Anxiety  ASSESSMENT / PLAN:  PULMONARY A: Acute respiratory failure. Hx of COPD. P:   Continue duoneb  CARDIOVASCULAR A:  S/p aortobifemoral bypass, Rt common femoral to posterior tibial bypass, Rt fifth toe amputation 3/01. Hx of HTN, HLD. P:  Post op care per [facility] Hold HCTZ, lisinopril, lopressor, zocor until able to take pills Prn labetalol, hydralazine IV  RENAL A:   Hypokalemia. P:   Monitor renal fx, urine outpt Replace electrolytes as needed  GASTROINTESTINAL A:   Nutrition. P:   NPO Advance diet per [facility] Protonix for SUP  HEMATOLOGIC A:   Anemia. P:  F/u CBC Lovenox for DVT prophylaxis  INFECTIOUS A:   Post op  prophylaxis >> completed 3/02. P:   Monitor clinically  ENDOCRINE A:   DM type II P:   SSI Hold outpt glucophage  NEUROLOGIC A:   Hx of schizophrenia, anxiety P:   Resume clozaril, aricept, neurontin, haldol, artane, bentyl when able to take po meds >> continue precedex for now   Chesley Mires, MD [place] 04/25/2015, 8:59 AM Pager:  [phone] After 3pm call: [phone]

## 2015-04-25 NOTE — Consult Note (Signed)
[facility] Consult   Reason for Consult:  Schizophrenia and status post vascular surgery and Psych medication management Referring Physician:  Dr. Bridgett Larsson Patient Identification: Craig Bentley MRN:  [id number] Principal Diagnosis: <principal problem not specified> Diagnosis:   Patient Active Problem List   Diagnosis Date Noted  . Post-operative state [Z98.890]   . Respiratory failure ([place]) [J96.90]   . Surgery, elective [Z41.9]   . Anxiety [F41.9]   . Schizophrenia ([place]) [F20.9]   . 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 Hyperglycemia [R73.9]   . Preoperative cardiovascular examination [Z01.810] 04/11/2015  . PVD (peripheral vascular disease) ([place]) [I73.9] 04/10/2015  . Absolute anemia [D64.9]   . Diabetic foot infection ([place]) [E11.69, L08.9] 04/02/2015  . Right foot pain [M79.671] 04/02/2015  . Neutrophilic leukocytosis [X41.2] 04/02/2015  . Esophageal abnormality [K22.9]   . HCAP (healthcare-associated pneumonia) [J18.9]   . COPD exacerbation ([place]) [J44.1]   . Dyspnea [R06.00]   . Respiratory distress [R06.00]   . Sepsis due to pneumonia ([place]) [J18.9, A41.9] 02/14/2015  . Acute respiratory failure with hypoxia ([place]) [J96.00] 02/13/2015  . Diabetes mellitus with neurologic complication, without long-term current use of insulin ([place]) [E11.49] 07/20/2011  . Essential hypertension [I10]   . COPD (chronic obstructive pulmonary disease) ([place]) [J44.9]     Total Time spent with patient: 1 hour  Subjective:   Craig Bentley is a 60 y.o. male patient admitted with Medication management for schizophrenia and status post vascular surgery and amputation of right toe.  HPI:  Craig Bentley is a 60 y.o. male admitted to [facility] with the right foot pain and required vascular surgery. Patient has been a resident of [facility] facility over several years and has been receiving psychiatric medication from facility provider. Patient reportedly  has history of visual hallucinations but denied current hallucinations, delusions and paranoia. Staff RN reported patient has been trying to stay awake and try to remember his name and date of birth etc. Patient is also required sedative medication because of screaming and yelling yesterday afternoon and also given Haldol 10 mg. Patient stated that he feels like he can swallow his medication today. Patient has a sister in [place] but does not have any contact number.. Patient denies current suicidal/homicidal ideation, intention or plans. Patient does not appear to be responding to internal stimuli.   Medical history: Patient with with schizophrenia and diabetes who presents with chief complaint: right foot pain. Onset of symptom occurred in November 2017. Pain is described as aching, severity 1-5/10, and associated with movement, rest and manipulating the right foot. Patient has attempted to treat this pain with rest. The patient has no rest pain symptoms. Wounds include: right 5th toe blackness and painful area in R 4th toe. Atherosclerotic risk factors include: HTN, DM, active smoker  Past Psychiatric History: Schizoaffective disorder  Risk to Self:   Risk to Others:   Prior Inpatient Therapy:   Prior Outpatient Therapy:    Past Medical History:  Past Medical History  Diagnosis Date  . Hypertension   . Diabetes mellitus   . Schizophrenia, schizo-affective ([place])   . COPD (chronic obstructive pulmonary disease) ([place])   . Constipation   . Fecal impaction ([place])   . Pneumonia   . Anxiety     Past Surgical History  Procedure Laterality Date  . Peripheral vascular catheterization N/A 04/07/2015    Procedure: Abdominal Aortogram;  Surgeon: Angelia Mould, MD;  Location: [facility];  Service: Cardiovascular;  Laterality: N/A;  . Aorta - bilateral femoral artery bypass graft Bilateral 04/23/2015    Procedure: AORTOBIFEMORAL BYPASS GRAFT;  Surgeon: Conrad Katherine, MD;   Location: [place] [facility];  Service: Vascular;  Laterality: Bilateral;  . Femoral-tibial bypass graft Right 04/23/2015    Procedure: Right Common Femoral to Posterior Tibial Bypass with Composite Vein Graft. ;  Surgeon: Conrad [place], MD;  Location: [facility];  Service: Vascular;  Laterality: Right;  . Amputation Right 04/23/2015    Procedure: Right Fifth Toe Amputation;  Surgeon: Conrad [place], MD;  Location: [facility];  Service: Vascular;  Laterality: Right;  . Femoral-femoral bypass graft Left 04/23/2015    Procedure:  Left Common Femoral Artery to Superficial Femoral Artery Bypass Graft with Vein;  Surgeon: Conrad Allen, MD;  Location: [facility] OR;  Service: Vascular;  Laterality: Left;   Family History:  Family History  Problem Relation Age of Onset  . Mental illness Mother    Family Psychiatric  History: Unknown Social History:  History  Alcohol Use No     History  Drug Use No    Social History   Social History  . Marital Status: Single    Spouse Name: N/A  . Number of Children: N/A  . Years of Education: N/A   Social History Main Topics  . Smoking status: Current Every Day Smoker -- 1.50 packs/day for 50 years    Types: Cigarettes  . Smokeless tobacco: None  . Alcohol Use: No  . Drug Use: No  . Sexual Activity: No   Other Topics Concern  . None   Social History Narrative   Additional Social History:    Allergies:  No Known Allergies  Labs:  Results for orders placed or performed during the hospital encounter of 04/23/15 (from the past 48 hour(s))  I-STAT 3, arterial blood gas (G3+)     Status: Abnormal   Collection Time: 04/23/15  8:48 PM  Result Value Ref Range   pH, Arterial 7.381 7.350 - 7.450   pCO2 arterial 41.4 35.0 - 45.0 mmHg   pO2, Arterial 89.0 80.0 - 100.0 mmHg   Bicarbonate 24.9 (H) 20.0 - 24.0 mEq/L   TCO2 26 0 - 100 mmol/L   O2 Saturation 97.0 %   Acid-base deficit 1.0 0.0 - 2.0 mmol/L   Patient temperature 35.5 C    Sample type ARTERIAL   CBC     Status: Abnormal    Collection Time: 04/23/15  8:50 PM  Result Value Ref Range   WBC 13.8 (H) 4.0 - 10.5 K/uL   RBC 3.62 (L) 4.22 - 5.81 MIL/uL   Hemoglobin 9.5 (L) 13.0 - 17.0 g/dL   HCT 30.6 (L) 39.0 - 52.0 %   MCV 84.5 78.0 - 100.0 fL   MCH 26.2 26.0 - 34.0 pg   MCHC 31.0 30.0 - 36.0 g/dL   RDW 13.8 11.5 - 15.5 %   Platelets 192 150 - 400 K/uL  Basic metabolic panel     Status: Abnormal   Collection Time: 04/23/15  8:50 PM  Result Value Ref Range   Sodium 137 135 - 145 mmol/L   Potassium 4.0 3.5 - 5.1 mmol/L   Chloride 101 101 - 111 mmol/L   CO2 24 22 - 32 mmol/L   Glucose, Bld 250 (H) 65 - 99 mg/dL   BUN 8 6 - 20 mg/dL   Creatinine, Ser 0.73 0.61 - 1.24 mg/dL   Calcium 8.1 (L) 8.9 - 10.3 mg/dL  GFR calc non Af Amer >60 >60 mL/min   GFR calc Af Amer >60 >60 mL/min    Comment: (NOTE) The eGFR has been calculated using the CKD EPI equation. This calculation has not been validated in all clinical situations. eGFR's persistently <60 mL/min signify possible Chronic Kidney Disease.    Anion gap 12 5 - 15  Protime-INR     Status: Abnormal   Collection Time: 04/23/15  8:50 PM  Result Value Ref Range   Prothrombin Time 16.3 (H) 11.6 - 15.2 seconds   INR 1.30 0.00 - 1.49  APTT     Status: None   Collection Time: 04/23/15  8:50 PM  Result Value Ref Range   aPTT 35 24 - 37 seconds  Magnesium     Status: Abnormal   Collection Time: 04/23/15  8:50 PM  Result Value Ref Range   Magnesium 1.0 (L) 1.7 - 2.4 mg/dL  Troponin I     Status: None   Collection Time: 04/23/15  8:50 PM  Result Value Ref Range   Troponin I <0.03 <0.031 ng/mL    Comment:        NO INDICATION OF MYOCARDIAL INJURY.   Glucose, capillary     Status: Abnormal   Collection Time: 04/23/15  9:30 PM  Result Value Ref Range   Glucose-Capillary 216 (H) 65 - 99 mg/dL   Comment 1 Capillary Specimen    Comment 2 Notify RN    Comment 3 Document in Chart   Triglycerides     Status: None   Collection Time: 04/23/15 10:09 PM  Result  Value Ref Range   Triglycerides 87 <150 mg/dL  Glucose, capillary     Status: Abnormal   Collection Time: 04/23/15 11:50 PM  Result Value Ref Range   Glucose-Capillary 176 (H) 65 - 99 mg/dL   Comment 1 Capillary Specimen    Comment 2 Notify RN    Comment 3 Document in Chart   CBC     Status: Abnormal   Collection Time: 04/24/15 12:20 AM  Result Value Ref Range   WBC 14.2 (H) 4.0 - 10.5 K/uL   RBC 3.58 (L) 4.22 - 5.81 MIL/uL   Hemoglobin 9.9 (L) 13.0 - 17.0 g/dL   HCT 30.4 (L) 39.0 - 52.0 %   MCV 84.9 78.0 - 100.0 fL   MCH 27.7 26.0 - 34.0 pg   MCHC 32.6 30.0 - 36.0 g/dL   RDW 13.8 11.5 - 15.5 %   Platelets 176 150 - 400 K/uL  Glucose, capillary     Status: Abnormal   Collection Time: 04/24/15  4:03 AM  Result Value Ref Range   Glucose-Capillary 133 (H) 65 - 99 mg/dL   Comment 1 Capillary Specimen    Comment 2 Notify RN    Comment 3 Document in Chart   CBC     Status: Abnormal   Collection Time: 04/24/15  4:05 AM  Result Value Ref Range   WBC 13.3 (H) 4.0 - 10.5 K/uL   RBC 3.82 (L) 4.22 - 5.81 MIL/uL   Hemoglobin 9.9 (L) 13.0 - 17.0 g/dL   HCT 32.3 (L) 39.0 - 52.0 %   MCV 84.6 78.0 - 100.0 fL   MCH 25.9 (L) 26.0 - 34.0 pg   MCHC 30.7 30.0 - 36.0 g/dL   RDW 13.9 11.5 - 15.5 %   Platelets 180 150 - 400 K/uL  Comprehensive metabolic panel     Status: Abnormal   Collection Time: 04/24/15  4:05 AM  Result Value Ref Range   Sodium 137 135 - 145 mmol/L   Potassium 3.9 3.5 - 5.1 mmol/L   Chloride 105 101 - 111 mmol/L   CO2 26 22 - 32 mmol/L   Glucose, Bld 159 (H) 65 - 99 mg/dL   BUN 8 6 - 20 mg/dL   Creatinine, Ser 0.75 0.61 - 1.24 mg/dL   Calcium 8.0 (L) 8.9 - 10.3 mg/dL   Total Protein 5.2 (L) 6.5 - 8.1 g/dL   Albumin 2.8 (L) 3.5 - 5.0 g/dL   AST 20 15 - 41 U/L   ALT 11 (L) 17 - 63 U/L   Alkaline Phosphatase 34 (L) 38 - 126 U/L   Total Bilirubin 0.8 0.3 - 1.2 mg/dL   GFR calc non Af Amer >60 >60 mL/min   GFR calc Af Amer >60 >60 mL/min    Comment: (NOTE) The eGFR  has been calculated using the CKD EPI equation. This calculation has not been validated in all clinical situations. eGFR's persistently <60 mL/min signify possible Chronic Kidney Disease.    Anion gap 6 5 - 15  Magnesium     Status: Abnormal   Collection Time: 04/24/15  4:05 AM  Result Value Ref Range   Magnesium 1.1 (L) 1.7 - 2.4 mg/dL  Amylase     Status: None   Collection Time: 04/24/15  4:05 AM  Result Value Ref Range   Amylase 52 28 - 100 U/L  Troponin I     Status: None   Collection Time: 04/24/15  4:05 AM  Result Value Ref Range   Troponin I <0.03 <0.031 ng/mL    Comment:        NO INDICATION OF MYOCARDIAL INJURY.   Blood gas, arterial     Status: Abnormal   Collection Time: 04/24/15  4:06 AM  Result Value Ref Range   FIO2 0.40    Delivery systems VENTILATOR    Mode PRESSURE REGULATED VOLUME CONTROL    VT 610 mL   LHR 12 resp/min   Peep/cpap 5.0 cm H20   pH, Arterial 7.444 7.350 - 7.450   pCO2 arterial 38.0 35.0 - 45.0 mmHg   pO2, Arterial 104 (H) 80.0 - 100.0 mmHg   Bicarbonate 25.6 (H) 20.0 - 24.0 mEq/L   TCO2 26.8 0 - 100 mmol/L   Acid-Base Excess 1.9 0.0 - 2.0 mmol/L   O2 Saturation 98.0 %   Patient temperature 98.6    Collection site A-LINE    Drawn by COLLECTED BY NURSE    Sample type ARTERIAL   Glucose, capillary     Status: None   Collection Time: 04/24/15  8:22 AM  Result Value Ref Range   Glucose-Capillary 96 65 - 99 mg/dL   Comment 1 Notify RN   Troponin I     Status: None   Collection Time: 04/24/15  8:59 AM  Result Value Ref Range   Troponin I <0.03 <0.031 ng/mL    Comment:        NO INDICATION OF MYOCARDIAL INJURY.   Glucose, capillary     Status: Abnormal   Collection Time: 04/24/15 12:19 PM  Result Value Ref Range   Glucose-Capillary 141 (H) 65 - 99 mg/dL   Comment 1 Notify RN   Glucose, capillary     Status: Abnormal   Collection Time: 04/24/15  3:37 PM  Result Value Ref Range   Glucose-Capillary 151 (H) 65 - 99 mg/dL   Comment 1  Notify RN   Glucose, capillary  Status: Abnormal   Collection Time: 04/24/15  7:31 PM  Result Value Ref Range   Glucose-Capillary 103 (H) 65 - 99 mg/dL   Comment 1 Capillary Specimen    Comment 2 Notify RN    Comment 3 Document in Chart   Glucose, capillary     Status: Abnormal   Collection Time: 04/24/15 11:57 PM  Result Value Ref Range   Glucose-Capillary 133 (H) 65 - 99 mg/dL   Comment 1 Capillary Specimen    Comment 2 Notify RN    Comment 3 Document in Chart   Glucose, capillary     Status: Abnormal   Collection Time: 04/25/15  3:59 AM  Result Value Ref Range   Glucose-Capillary 120 (H) 65 - 99 mg/dL   Comment 1 Capillary Specimen    Comment 2 Notify RN    Comment 3 Document in Chart   Basic metabolic panel     Status: Abnormal   Collection Time: 04/25/15  4:26 AM  Result Value Ref Range   Sodium 138 135 - 145 mmol/L   Potassium 3.4 (L) 3.5 - 5.1 mmol/L   Chloride 103 101 - 111 mmol/L   CO2 25 22 - 32 mmol/L   Glucose, Bld 139 (H) 65 - 99 mg/dL   BUN 10 6 - 20 mg/dL   Creatinine, Ser 0.72 0.61 - 1.24 mg/dL   Calcium 7.9 (L) 8.9 - 10.3 mg/dL   GFR calc non Af Amer >60 >60 mL/min   GFR calc Af Amer >60 >60 mL/min    Comment: (NOTE) The eGFR has been calculated using the CKD EPI equation. This calculation has not been validated in all clinical situations. eGFR's persistently <60 mL/min signify possible Chronic Kidney Disease.    Anion gap 10 5 - 15  CBC     Status: Abnormal   Collection Time: 04/25/15  4:26 AM  Result Value Ref Range   WBC 16.0 (H) 4.0 - 10.5 K/uL   RBC 3.11 (L) 4.22 - 5.81 MIL/uL   Hemoglobin 8.2 (L) 13.0 - 17.0 g/dL   HCT 26.0 (L) 39.0 - 52.0 %   MCV 83.6 78.0 - 100.0 fL   MCH 26.4 26.0 - 34.0 pg   MCHC 31.5 30.0 - 36.0 g/dL   RDW 14.0 11.5 - 15.5 %   Platelets 150 150 - 400 K/uL  Magnesium     Status: Abnormal   Collection Time: 04/25/15  4:26 AM  Result Value Ref Range   Magnesium 1.5 (L) 1.7 - 2.4 mg/dL    Current  Facility-Administered Medications  Medication Dose Route Frequency Provider Last Rate Last Dose  . acetaminophen (TYLENOL) tablet 325-650 mg  325-650 mg Oral Q4H PRN Ulyses Amor, PA-C       Or  . acetaminophen (TYLENOL) suppository 325-650 mg  325-650 mg Rectal Q4H PRN Ulyses Amor, PA-C      . albuterol (PROVENTIL) (2.5 MG/3ML) 0.083% nebulizer solution 2.5 mg  2.5 mg Nebulization Q3H PRN Rahul P Desai, PA-C      . alum & mag hydroxide-simeth (MAALOX/MYLANTA) 200-200-20 MG/5ML suspension 15-30 mL  15-30 mL Oral Q2H PRN Ulyses Amor, PA-C      . antiseptic oral rinse solution (CORINZ)  7 mL Mouth Rinse QID Conrad [place], MD   7 mL at 04/25/15 0019  . bisacodyl (DULCOLAX) suppository 10 mg  10 mg Rectal Daily PRN Ulyses Amor, PA-C      . chlorhexidine gluconate (PERIDEX) 0.12 % solution 15 mL  15 mL Mouth Rinse BID Conrad [place], MD   15 mL at 04/25/15 0732  . dexmedetomidine (PRECEDEX) 200 MCG/50ML (4 mcg/mL) infusion  0.4-1.2 mcg/kg/hr Intravenous Continuous Chesley Mires, MD 10.4 mL/hr at 04/25/15 0655 0.5 mcg/kg/hr at 04/25/15 0655  . dextrose 5 %-0.45 % sodium chloride infusion   Intravenous Continuous Conrad [place], MD 75 mL/hr at 04/25/15 0800    . docusate sodium (COLACE) capsule 100 mg  100 mg Oral Daily Ulyses Amor, PA-C   100 mg at 04/24/15 1000  . enoxaparin (LOVENOX) injection 40 mg  40 mg Subcutaneous Q24H Ulyses Amor, PA-C   40 mg at 04/25/15 0736  . guaiFENesin-dextromethorphan (ROBITUSSIN DM) 100-10 MG/5ML syrup 15 mL  15 mL Oral Q4H PRN Ulyses Amor, PA-C      . haloperidol lactate (HALDOL) injection 5 mg  5 mg Intravenous Q6H PRN Chesley Mires, MD   5 mg at 04/24/15 1211  . haloperidol lactate (HALDOL) injection 5 mg  5 mg Intravenous Once Conrad [place], MD   5 mg at 04/24/15 1315  . hydrALAZINE (APRESOLINE) injection 5 mg  5 mg Intravenous Q20 Min PRN Ulyses Amor, PA-C   5 mg at 04/24/15 4503  . insulin aspart (novoLOG) injection 0-15 Units  0-15 Units Subcutaneous 6  times per day Rahul Dianna Rossetti, PA-C   2 Units at 04/25/15 0017  . ipratropium-albuterol (DUONEB) 0.5-2.5 (3) MG/3ML nebulizer solution 3 mL  3 mL Nebulization Q6H Rahul P Desai, PA-C   3 mL at 04/25/15 0931  . labetalol (NORMODYNE,TRANDATE) injection 10 mg  10 mg Intravenous Q10 min PRN Ulyses Amor, PA-C   10 mg at 04/24/15 1400  . LORazepam (ATIVAN) injection 1 mg  1 mg Intravenous Q6H PRN Conrad [place], MD      . magnesium sulfate IVPB 2 g 50 mL  2 g Intravenous Daily PRN Ulyses Amor, PA-C      . morphine 2 MG/ML injection 2 mg  2 mg Intravenous Q2H PRN Chesley Mires, MD   2 mg at 04/25/15 0737  . ondansetron (ZOFRAN) injection 4 mg  4 mg Intravenous Q6H PRN Ulyses Amor, PA-C      . pantoprazole (PROTONIX) injection 40 mg  40 mg Intravenous Daily Rahul P Desai, PA-C   40 mg at 04/24/15 1026  . phenol (CHLORASEPTIC) mouth spray 1 spray  1 spray Mouth/Throat PRN Ulyses Amor, PA-C      . potassium chloride SA (K-DUR,KLOR-CON) CR tablet 20-40 mEq  20-40 mEq Oral Daily PRN Ulyses Amor, PA-C      . senna-docusate (Senokot-S) tablet 1 tablet  1 tablet Oral QHS PRN Ulyses Amor, PA-C      . sodium phosphate (FLEET) 7-19 GM/118ML enema 1 enema  1 enema Rectal Once PRN Conrad [place], MD        Musculoskeletal: Strength & Muscle Tone: decreased Gait & Station: unable to stand Patient leans: N/A  Psychiatric Specialty Exam: ROS patient is awake, made good eye contact able to respond verbally without irritability, agitation and aggressive behavior during this evaluation. Patient is also on Prevacid access for staff RN and was received Haldol yesterday without any EPS   Blood pressure 124/63, pulse 87, temperature 99.1 F (37.3 C), temperature source Axillary, resp. rate 26, height 5' 11.5" (1.816 m), weight 83.2 kg (183 lb 6.8 oz), SpO2 99 %.Body mass index is 25.23 kg/(m^2).  General Appearance: Guarded  Eye Contact::  Good  Speech:  Clear and Coherent and Slurred  Volume:  Decreased   Mood:  Anxious  Affect:  Appropriate and Congruent  Thought Process:  Coherent and Goal Directed  Orientation:  Full (Time, Place, and Person)  Thought Content:  WDL  Suicidal Thoughts:  No  Homicidal Thoughts:  No  Memory:  Immediate;   Fair Recent;   Fair  Judgement:  Fair  Insight:  Fair  Psychomotor Activity:  Decreased  Concentration:  Fair  Recall:  [employer] of Knowledge:Good  Language: Good  Akathisia:  Negative  Handed:  Right  AIMS (if indicated):     Assets:  Communication Skills Desire for Improvement Financial Resources/Insurance Housing Leisure Time Resilience Social Support Transportation  ADL's:  Impaired  Cognition: Impaired,  Mild  Sleep:      Treatment Plan Summary: Daily contact with patient to assess and evaluate symptoms and progress in treatment and Medication management Patient can be given Haldol 10 mg intravenously twice daily for controlling psychosis, agitation and aggressive behaviors  Patient home medication should be restarted as soon as he is able to tolerate oral medication  Patient has no acceptable symptoms, monitor for cardiac EKG for prolonged QTC   Disposition: Patient has plans of getting rehabilitation and going back to assisted living facility when medically stable  Patient does not meet criteria for psychiatric inpatient admission. Supportive therapy provided about ongoing stressors.  Durward Parcel., MD 04/25/2015 9:31 AM

## 2015-04-25 NOTE — Progress Notes (Addendum)
   Daily Progress Note  Assessment/Planning: POD #2 s/p ABF, R CFA to PT w/ composite graft/vein, L CFA to SFA bypass with ips reversed GSV, R 5th toe amputation   NEURO: improved agitation with Precedex, wean precedex as tolerated  PULM: extubated, on room air  CV: mildly tachycardiac, expected post-op course, stable HD  GI: hypo BS, Pt pulled out NGT during agitation early yesterday, doubt he would allow replacement  FEN: replace K, switch to MIVF  REN: acceptable UOP, 1.1 L/24 hrs  HEME/ID: no fevers, watch WBC, likely stress response  IV: leave cordis for blood draws for nowPsych: will come by to provide some help with Psych regimen given NPO status  VASC: keep bandages on staples to avoid pt injurying him self, bandages to bilateral groin to keep dry   Subjective  - 2 Days Post-Op  Calm since precedex drip initiated  Objective Filed Vitals:   04/25/15 0100 04/25/15 0200 04/25/15 0300 04/25/15 0348  BP: 125/59 119/60 128/64 125/72  Pulse: 101 99 107 102  Temp:    98.9 F (37.2 C)  TempSrc:    Oral  Resp: 15 28 33 25  Height:      Weight:      SpO2: 97% 97% 97% 100%    Intake/Output Summary (Last 24 hours) at 04/25/15 0526 Last data filed at 04/25/15 0400  Gross per 24 hour  Intake 1911.9 ml  Output   1390 ml  Net  521.9 ml   NEURO confused, non-combative PULM  BLL rales CV  RR, mildly tachy GI  soft, dilated, minimal BS, -G/R, appropriate TTP, mild drainage on bandages VASC  B groin incision c/d/i, L vein harvest incisions with mild drainage on bandages, R PT palpable, L PT faintly palpable, dopplerable   Laboratory CBC    Component Value Date/Time   WBC 16.0* 04/25/2015 0426   HGB 8.2* 04/25/2015 0426   HCT 26.0* 04/25/2015 0426   PLT 150 04/25/2015 0426    BMET    Component Value Date/Time   NA 138 04/25/2015 0426   K 3.4* 04/25/2015 0426   CL 103 04/25/2015 0426   CO2 25 04/25/2015 0426   GLUCOSE 139* 04/25/2015 0426   BUN 10  04/25/2015 0426   CREATININE 0.72 04/25/2015 0426   CALCIUM 7.9* 04/25/2015 0426   GFRNONAA >60 04/25/2015 0426   GFRAA >60 04/25/2015 0426    Adele Barthel, MD [facility] Office: [phone] Pager: [phone]  04/25/2015, 5:26 AM

## 2015-04-25 NOTE — Anesthesia Postprocedure Evaluation (Signed)
Anesthesia Post Note  Patient: Craig Bentley  Procedure(s) Performed: Procedure(s) (LRB): AORTOBIFEMORAL BYPASS GRAFT (Bilateral) Right Common Femoral to Posterior Tibial Bypass with Composite Vein Graft.  (Right) Right Fifth Toe Amputation (Right)  Left Common Femoral Artery to Superficial Femoral Artery Bypass Graft with Vein (Left)  Patient location during evaluation: SICU Anesthesia Type: General Level of consciousness: sedated Pain management: pain level controlled Vital Signs Assessment: post-procedure vital signs reviewed and stable Respiratory status: patient remains intubated per anesthesia plan Cardiovascular status: stable Anesthetic complications: no    Last Vitals:  Filed Vitals:   04/25/15 1600 04/25/15 1638  BP: 119/63 124/63  Pulse: 108 101  Temp:    Resp: 25 26    Last Pain:  Filed Vitals:   04/25/15 1638  PainSc: 9                  Lurlie Wigen,W. EDMOND

## 2015-04-26 DIAGNOSIS — F200 Paranoid schizophrenia: Secondary | ICD-10-CM

## 2015-04-26 DIAGNOSIS — I70261 Atherosclerosis of native arteries of extremities with gangrene, right leg: Secondary | ICD-10-CM

## 2015-04-26 LAB — GLUCOSE, CAPILLARY
GLUCOSE-CAPILLARY: 124 mg/dL — AB (ref 65–99)
GLUCOSE-CAPILLARY: 138 mg/dL — AB (ref 65–99)
GLUCOSE-CAPILLARY: 156 mg/dL — AB (ref 65–99)
Glucose-Capillary: 114 mg/dL — ABNORMAL HIGH (ref 65–99)
Glucose-Capillary: 141 mg/dL — ABNORMAL HIGH (ref 65–99)
Glucose-Capillary: 115 mg/dL — ABNORMAL HIGH (ref 65–99)

## 2015-04-26 LAB — BASIC METABOLIC PANEL
Anion gap: 9 (ref 5–15)
BUN: 7 mg/dL (ref 6–20)
CO2: 26 mmol/L (ref 22–32)
Calcium: 8.3 mg/dL — ABNORMAL LOW (ref 8.9–10.3)
Chloride: 102 mmol/L (ref 101–111)
Creatinine, Ser: 0.59 mg/dL — ABNORMAL LOW (ref 0.61–1.24)
GFR calc Af Amer: >60 mL/min (ref >60)
GFR calc non Af Amer: >60 mL/min (ref >60)
GLUCOSE: 164 mg/dL — AB (ref 65–99)
POTASSIUM: 3.1 mmol/L — AB (ref 3.5–5.1)
SODIUM: 137 mmol/L (ref 135–145)

## 2015-04-26 LAB — CBC
HCT: 23 % — ABNORMAL LOW (ref 39.0–52.0)
HEMOGLOBIN: 7.3 g/dL — AB (ref 13.0–17.0)
MCH: 26.4 pg (ref 26.0–34.0)
MCHC: 31.7 g/dL (ref 30.0–36.0)
MCV: 83 fL (ref 78.0–100.0)
PLATELETS: 139 10*3/uL — AB (ref 150–400)
RBC: 2.77 MIL/uL — AB (ref 4.22–5.81)
RDW: 13.8 % (ref 11.5–15.5)
WBC: 12 10*3/uL — AB (ref 4.0–10.5)

## 2015-04-26 MED ORDER — LORAZEPAM 2 MG/ML IJ SOLN
1.0000 mg | Freq: Two times a day (BID) | INTRAMUSCULAR | Status: DC
  Administered 2015-04-26 – 2015-04-27 (×4): 1 mg via INTRAVENOUS
  Filled 2015-04-26 (×4): qty 1

## 2015-04-26 MED ORDER — POTASSIUM CHLORIDE 10 MEQ/100ML IV SOLN
10.0000 meq | INTRAVENOUS | Status: AC
  Administered 2015-04-26 (×3): 10 meq via INTRAVENOUS
  Filled 2015-04-26 (×3): qty 100

## 2015-04-26 MED ORDER — CETYLPYRIDINIUM CHLORIDE 0.05 % MT LIQD
7.0000 mL | Freq: Two times a day (BID) | OROMUCOSAL | Status: DC
  Administered 2015-04-27 – 2015-04-30 (×7): 7 mL via OROMUCOSAL

## 2015-04-26 MED ORDER — HALOPERIDOL LACTATE 5 MG/ML IJ SOLN
5.0000 mg | Freq: Four times a day (QID) | INTRAMUSCULAR | Status: DC
  Administered 2015-04-26 – 2015-04-28 (×7): 5 mg via INTRAVENOUS
  Filled 2015-04-26 (×7): qty 1

## 2015-04-26 MED ORDER — CHLORHEXIDINE GLUCONATE 0.12 % MT SOLN
15.0000 mL | Freq: Two times a day (BID) | OROMUCOSAL | Status: DC
  Administered 2015-04-26 – 2015-04-30 (×8): 15 mL via OROMUCOSAL
  Filled 2015-04-26 (×3): qty 15

## 2015-04-26 NOTE — Progress Notes (Signed)
    Subjective  - POD #3, s/p ABF, R CFA to PT w/ composite graft/vein, L CFA to SFA bypass with ips reversed GSV, R 5th toe amputation  Denies flatus No pain today   Physical Exam:  Abdominal dressing dry Groin incisions clean Seams a little confused this am       Assessment/Plan:  POD #3   NEURO:  wean precedex as tolerated  PULM:  on room air, tolerating breathing treatments  CV: mildly tachycardiac, expected post-op course, stable HD  GI: No flatus, keep NPO  FEN: replace K, switch to MIVF  REN: acceptable UOP  HEME/ID: No acute issues  IV: leave cordis for blood draws for nowPsych: will come by to provide some help with Psych regimen given NPO status  VASC: keep bandages on staples to avoid pt injurying him self, bandages to bilateral groin to keep dry  Craig Bentley, [place] 04/26/2015 8:02 AM --  Filed Vitals:   04/26/15 0700 04/26/15 0801  BP: 103/58   Pulse: 78   Temp:  98.3 F (36.8 C)  Resp: 26     Intake/Output Summary (Last 24 hours) at 04/26/15 0802 Last data filed at 04/26/15 0600  Gross per 24 hour  Intake 2095.3 ml  Output   1095 ml  Net 1000.3 ml     Laboratory CBC    Component Value Date/Time   WBC 12.0* 04/26/2015 0430   HGB 7.3* 04/26/2015 0430   HCT 23.0* 04/26/2015 0430   PLT 139* 04/26/2015 0430    BMET    Component Value Date/Time   NA 137 04/26/2015 0430   K 3.1* 04/26/2015 0430   CL 102 04/26/2015 0430   CO2 26 04/26/2015 0430   GLUCOSE 164* 04/26/2015 0430   BUN 7 04/26/2015 0430   CREATININE 0.59* 04/26/2015 0430   CALCIUM 8.3* 04/26/2015 0430   GFRNONAA >60 04/26/2015 0430   GFRAA >60 04/26/2015 0430    COAG Lab Results  Component Value Date   INR 1.30 04/23/2015   INR 1.08 04/18/2015   INR 1.09 04/12/2015   No results found for: PTT  Antibiotics Anti-infectives    Start     Dose/Rate Route Frequency Ordered Stop   04/24/15 0200  cefUROXime (ZINACEF) 1.5 g in dextrose 5 % 50 mL IVPB     1.5 g 100  mL/hr over 30 Minutes Intravenous Every 12 hours 04/23/15 2034 04/24/15 1453   04/23/15 1315  cefUROXime (ZINACEF) 1.5 g in dextrose 5 % 50 mL IVPB  Status:  Discontinued     1.5 g 100 mL/hr over 30 Minutes Intravenous To [facility] 04/23/15 1311 04/23/15 2017   04/23/15 0730  cefUROXime (ZINACEF) 1.5 g in dextrose 5 % 50 mL IVPB     1.5 g 100 mL/hr over 30 Minutes Intravenous To [facility] 04/22/15 2009 04/23/15 1734       V. Leia Alf, M.D. [facility] [place] Office: [phone] Pager:  [phone]

## 2015-04-26 NOTE — Progress Notes (Addendum)
PULMONARY / CRITICAL CARE MEDICINE   Name: Craig Bentley MRN: [id number] DOB: 09-28-1955    ADMISSION DATE:  04/23/2015 CONSULTATION DATE:  04/23/2015  REFERRING MD:  Dr. Bridgett Larsson  CHIEF COMPLAINT:  Leg pain  SUBJECTIVE:  Slept well overnight.  Denies chest or abdominal pain.remains on precedex  VITAL SIGNS: BP 108/56 mmHg  Pulse 80  Temp(Src) 98.3 F (36.8 C) (Oral)  Resp 20  Ht 5' 11.5" (1.816 m)  Wt 83.2 kg (183 lb 6.8 oz)  BMI 25.23 kg/m2  SpO2 99%  INTAKE / OUTPUT: I/O last 3 completed shifts: In: 8938 [I.V.:2841; IV Piggyback:400] Out: 1500 [Urine:1500]  PHYSICAL EXAMINATION: General:  Pleasant, calm Neuro:  Follows  commands HEENT:  No stridor, jvd wnl Cardiovascular: s1 s2 regular, no murmur Lungs:  CTA Abdomen:  Soft, non tender Musculoskeletal:  Wound sites clean Skin: no rashes  LABS:  BMET  Recent Labs Lab 04/24/15 0405 04/25/15 0426 04/26/15 0430  NA 137 138 137  K 3.9 3.4* 3.1*  CL 105 103 102  CO2 '26 25 26  '$ BUN '8 10 7  '$ CREATININE 0.75 0.72 0.59*  GLUCOSE 159* 139* 164*    Electrolytes  Recent Labs Lab 04/23/15 2050 04/24/15 0405 04/25/15 0426 04/26/15 0430  CALCIUM 8.1* 8.0* 7.9* 8.3*  MG 1.0* 1.1* 1.5*  --     CBC  Recent Labs Lab 04/24/15 0405 04/25/15 0426 04/26/15 0430  WBC 13.3* 16.0* 12.0*  HGB 9.9* 8.2* 7.3*  HCT 32.3* 26.0* 23.0*  PLT 180 150 139*    Coag's  Recent Labs Lab 04/23/15 2050  APTT 35  INR 1.30    ABG  Recent Labs Lab 04/23/15 1503 04/23/15 2048 04/24/15 0406  PHART 7.456* 7.381 7.444  PCO2ART 34.5* 41.4 38.0  PO2ART 141.0* 89.0 104*    Liver Enzymes  Recent Labs Lab 04/24/15 0405  AST 20  ALT 11*  ALKPHOS 34*  BILITOT 0.8  ALBUMIN 2.8*    Cardiac Enzymes  Recent Labs Lab 04/23/15 2050 04/24/15 0405 04/24/15 0859  TROPONINI <0.03 <0.03 <0.03    Glucose  Recent Labs Lab 04/25/15 1215 04/25/15 1556 04/25/15 1948 04/26/15 0001 04/26/15 0356 04/26/15 0755   GLUCAP 121* 151* 156* 138* 156* 124*    Imaging No results found.   STUDIES:   CULTURES:  ANTIBIOTICS:  SIGNIFICANT EVENTS: 3/01 Admit, aortobifemoral bypass, Rt common femoral to posterior tibial bypass, Rt fifth toe amputation 3/02 Extubated; agitated after extubation >> improved with precedex  LINES/TUBES: 3/01 Rt IJ Introducer >> 3/01 ETT >> 3/02   3/01 Rt radial aline >> 3/02  DISCUSSION: 60 yo male smoker with non healing Rt foot wound and PAD.  He had aortobifemoral bypass, Rt common femoral to posterior tibial bypass, Rt fifth toe amputation on 3/01.  He remained on vent post op and PCCM consulted.  He has PMHx of HTN, HLD, DM, COPD, Schizophrenia, Anxiety  ASSESSMENT / PLAN:  PULMONARY A: Acute respiratory failure. Hx of COPD. ATX on pcxr resolving P:   Continue duoneb No distress IS upright  CARDIOVASCULAR A:  S/p aortobifemoral bypass, Rt common femoral to posterior tibial bypass, Rt fifth toe amputation 3/01. Hx of HTN, HLD. P:  Hold HCTZ, lisinopril, lopressor, zocor until able to take pills Prn labetalol, hydralazine IV Tolerating precedex, monitor for brady  RENAL A:   Hypokalemia. P:   Ensure lower rate fluids k supp  GASTROINTESTINAL A:   Nutrition. Ileus likely P:   NPO May need replacement NGT, monitor Protonix for SUP  HEMATOLOGIC A:   Anemia. P:  F/u CBC Lovenox for DVT prophylaxis, follow crt in am   INFECTIOUS A:   Post op prophylaxis >> completed 3/02. P:   Monitor clinically for fevers  ENDOCRINE A:   DM type II P:   SSI  NEUROLOGIC A:   Hx of schizophrenia, anxiety Delirium, agitation  P:   Resume clozaril, aricept, neurontin, oral haldol, artane, bentyl when able to take po meds Maintain precedex but add IV haldol and ativan scheduled if needed to limit precedex use Get QTC  Ccm time 30 min   Lavon Paganini. Titus Mould, MD, [place] Pgr: [facility]

## 2015-04-27 LAB — CBC WITH DIFFERENTIAL/PLATELET
Basophils Absolute: 0 10*3/uL (ref 0.0–0.1)
Basophils Relative: 0 %
EOS ABS: 0 10*3/uL (ref 0.0–0.7)
EOS PCT: 0 %
HCT: 23.7 % — ABNORMAL LOW (ref 39.0–52.0)
HEMOGLOBIN: 7.7 g/dL — AB (ref 13.0–17.0)
LYMPHS ABS: 2.5 10*3/uL (ref 0.7–4.0)
LYMPHS PCT: 22 %
MCH: 26.1 pg (ref 26.0–34.0)
MCHC: 32.5 g/dL (ref 30.0–36.0)
MCV: 80.3 fL (ref 78.0–100.0)
MONOS PCT: 8 %
Monocytes Absolute: 0.9 10*3/uL (ref 0.1–1.0)
NEUTROS PCT: 70 %
Neutro Abs: 8 10*3/uL — ABNORMAL HIGH (ref 1.7–7.7)
Platelets: 169 10*3/uL (ref 150–400)
RBC: 2.95 MIL/uL — AB (ref 4.22–5.81)
RDW: 13.7 % (ref 11.5–15.5)
WBC: 11.4 10*3/uL — AB (ref 4.0–10.5)

## 2015-04-27 LAB — GLUCOSE, CAPILLARY
GLUCOSE-CAPILLARY: 126 mg/dL — AB (ref 65–99)
GLUCOSE-CAPILLARY: 134 mg/dL — AB (ref 65–99)
GLUCOSE-CAPILLARY: 123 mg/dL — AB (ref 65–99)
GLUCOSE-CAPILLARY: 135 mg/dL — AB (ref 65–99)
Glucose-Capillary: 116 mg/dL — ABNORMAL HIGH (ref 65–99)
Glucose-Capillary: 153 mg/dL — ABNORMAL HIGH (ref 65–99)

## 2015-04-27 LAB — CBC
HCT: 24.3 % — ABNORMAL LOW (ref 39.0–52.0)
Hemoglobin: 7.8 g/dL — ABNORMAL LOW (ref 13.0–17.0)
MCH: 25.9 pg — ABNORMAL LOW (ref 26.0–34.0)
MCHC: 32.1 g/dL (ref 30.0–36.0)
MCV: 80.7 fL (ref 78.0–100.0)
PLATELETS: 166 10*3/uL (ref 150–400)
RBC: 3.01 MIL/uL — ABNORMAL LOW (ref 4.22–5.81)
RDW: 13.6 % (ref 11.5–15.5)
WBC: 11.8 10*3/uL — AB (ref 4.0–10.5)

## 2015-04-27 LAB — BASIC METABOLIC PANEL
ANION GAP: 10 (ref 5–15)
BUN: 9 mg/dL (ref 6–20)
CALCIUM: 8.3 mg/dL — AB (ref 8.9–10.3)
CO2: 25 mmol/L (ref 22–32)
Chloride: 103 mmol/L (ref 101–111)
Creatinine, Ser: 0.68 mg/dL (ref 0.61–1.24)
GFR calc Af Amer: >60 mL/min (ref >60)
GLUCOSE: 150 mg/dL — AB (ref 65–99)
Potassium: 3.1 mmol/L — ABNORMAL LOW (ref 3.5–5.1)
Sodium: 138 mmol/L (ref 135–145)

## 2015-04-27 MED ORDER — POTASSIUM CHLORIDE 10 MEQ/50ML IV SOLN
10.0000 meq | INTRAVENOUS | Status: AC
  Administered 2015-04-27 (×2): 10 meq via INTRAVENOUS
  Filled 2015-04-27 (×2): qty 50

## 2015-04-27 MED ORDER — POTASSIUM CHLORIDE 10 MEQ/50ML IV SOLN
10.0000 meq | INTRAVENOUS | Status: AC
  Administered 2015-04-27 (×3): 10 meq via INTRAVENOUS
  Filled 2015-04-27: qty 50

## 2015-04-27 NOTE — Progress Notes (Signed)
PULMONARY / CRITICAL CARE MEDICINE   Name: Craig Bentley MRN: [id number] DOB: Nov 28, 1955    ADMISSION DATE:  04/23/2015 CONSULTATION DATE:  04/23/2015  REFERRING MD:  Dr. Bridgett Larsson  CHIEF COMPLAINT:  Leg pain  SUBJECTIVE:  Calm, off precedex  VITAL SIGNS: BP 146/69 mmHg  Pulse 112  Temp(Src) 98.7 F (37.1 C) (Oral)  Resp 28  Ht 5' 11.5" (1.816 m)  Wt 83.2 kg (183 lb 6.8 oz)  BMI 25.23 kg/m2  SpO2 97%  INTAKE / OUTPUT: I/O last 3 completed shifts: In: 2372.7 [I.V.:1972.7; IV [id number]:329] Out: 9242 [Urine:1520; Emesis/NG output:25]  PHYSICAL EXAMINATION: General:  Pleasant, calm Neuro:  Follows  commands HEENT:  No stridor, jvd wnl Cardiovascular: s1 s2 regular, no murmur Lungs:  CTA to bases Abdomen:  Soft, non tender, low BS, some distention, no pain Musculoskeletal:  Wound sites clean Skin: no rashes  LABS:  BMET  Recent Labs Lab 04/25/15 0426 04/26/15 0430 04/27/15 0409  NA 138 137 138  K 3.4* 3.1* 3.1*  CL 103 102 103  CO2 '25 26 25  '$ BUN '10 7 9  '$ CREATININE 0.72 0.59* 0.68  GLUCOSE 139* 164* 150*    Electrolytes  Recent Labs Lab 04/23/15 2050 04/24/15 0405 04/25/15 0426 04/26/15 0430 04/27/15 0409  CALCIUM 8.1* 8.0* 7.9* 8.3* 8.3*  MG 1.0* 1.1* 1.5*  --   --     CBC  Recent Labs Lab 04/25/15 0426 04/26/15 0430 04/27/15 0409  WBC 16.0* 12.0* 11.8*  HGB 8.2* 7.3* 7.8*  HCT 26.0* 23.0* 24.3*  PLT 150 139* 166    Coag's  Recent Labs Lab 04/23/15 2050  APTT 35  INR 1.30    ABG  Recent Labs Lab 04/23/15 1503 04/23/15 2048 04/24/15 0406  PHART 7.456* 7.381 7.444  PCO2ART 34.5* 41.4 38.0  PO2ART 141.0* 89.0 104*    Liver Enzymes  Recent Labs Lab 04/24/15 0405  AST 20  ALT 11*  ALKPHOS 34*  BILITOT 0.8  ALBUMIN 2.8*    Cardiac Enzymes  Recent Labs Lab 04/23/15 2050 04/24/15 0405 04/24/15 0859  TROPONINI <0.03 <0.03 <0.03    Glucose  Recent Labs Lab 04/26/15 1205 04/26/15 1612 04/26/15 2021  04/27/15 0020 04/27/15 0423 04/27/15 0756  GLUCAP 114* 141* 115* 153* 126* 134*    Imaging No results found.   STUDIES:   CULTURES:  ANTIBIOTICS:  SIGNIFICANT EVENTS: 3/01 Admit, aortobifemoral bypass, Rt common femoral to posterior tibial bypass, Rt fifth toe amputation 3/02 Extubated; agitated after extubation >> improved with precedex  LINES/TUBES: 3/01 Rt IJ Introducer >> 3/01 ETT >> 3/02   3/01 Rt radial aline >> 3/02  DISCUSSION: 61 yo male smoker with non healing Rt foot wound and PAD.  He had aortobifemoral bypass, Rt common femoral to posterior tibial bypass, Rt fifth toe amputation on 3/01.  He remained on vent post op and PCCM consulted.  He has PMHx of HTN, HLD, DM, COPD, Schizophrenia, Anxiety  ASSESSMENT / PLAN:  PULMONARY A: Acute respiratory failure. Hx of COPD. ATX on pcxr resolving P:   Continue duoneb No distress IS Upright Treating ileus  CARDIOVASCULAR A:  S/p aortobifemoral bypass, Rt common femoral to posterior tibial bypass, Rt fifth toe amputation 3/01. Hx of HTN, HLD. P:  Hold HCTZ, lisinopril, lopressor, zocor until able to take pills Prn labetalol,  Dc hydralazine IV with tachy  RENAL A:   Hypokalemia. P:   k supp IV additional to goals below Allow pos balance  GASTROINTESTINAL A:   Nutrition. Ileus  likely P:   NPO Keep k greater 4 with ileus Assess, mag, phos Protonix for SUP Diet per vs  HEMATOLOGIC A:   Anemia. P:  F/u CBC in am for hct Lovenox for DVT prophylaxis, follow crt in am   INFECTIOUS A:   Post op prophylaxis >> completed 3/02. P:   Monitor clinically for fevers  ENDOCRINE A:   DM type II P:   SSI  NEUROLOGIC A:   Hx of schizophrenia, anxiety Delirium, agitation  P:   Resume clozaril, aricept, neurontin, oral haldol, artane, bentyl when able to take po meds Dc precedex from mar, avoid restart IV haldol and ativan scheduled - maintain , did well Get QTC daily  consider transfer  out  Lavon Paganini. Titus Mould, MD, [place] Pgr: [facility]

## 2015-04-27 NOTE — Progress Notes (Signed)
    Subjective  - POD #4  Denies flatus but is [place]   Physical Exam:  Incisions ok  doppler pedal pulses Remains confused      Assessment/Plan:  POD #4  Appreciate CCM assistance Remains on Precedex D/c foley Pulm:  Stable on RA CV:  Remains tachy.  con't lopressor prn GI:  Keep NPO until passing flatus Lovenox for DVT prophylaxis  Faryal Marxen, [place] 04/27/2015 11:03 AM --  Filed Vitals:   04/27/15 0803 04/27/15 0900  BP:  146/69  Pulse:  112  Temp: 98.7 F (37.1 C)   Resp:  28    Intake/Output Summary (Last 24 hours) at 04/27/15 1103 Last data filed at 04/27/15 0900  Gross per 24 hour  Intake 1102.7 ml  Output   1015 ml  Net   87.7 ml     Laboratory CBC    Component Value Date/Time   WBC 11.8* 04/27/2015 0409   HGB 7.8* 04/27/2015 0409   HCT 24.3* 04/27/2015 0409   PLT 166 04/27/2015 0409    BMET    Component Value Date/Time   NA 138 04/27/2015 0409   K 3.1* 04/27/2015 0409   CL 103 04/27/2015 0409   CO2 25 04/27/2015 0409   GLUCOSE 150* 04/27/2015 0409   BUN 9 04/27/2015 0409   CREATININE 0.68 04/27/2015 0409   CALCIUM 8.3* 04/27/2015 0409   GFRNONAA >60 04/27/2015 0409   GFRAA >60 04/27/2015 0409    COAG Lab Results  Component Value Date   INR 1.30 04/23/2015   INR 1.08 04/18/2015   INR 1.09 04/12/2015   No results found for: PTT  Antibiotics Anti-infectives    Start     Dose/Rate Route Frequency Ordered Stop   04/24/15 0200  cefUROXime (ZINACEF) 1.5 g in dextrose 5 % 50 mL IVPB     1.5 g 100 mL/hr over 30 Minutes Intravenous Every 12 hours 04/23/15 2034 04/24/15 1453   04/23/15 1315  cefUROXime (ZINACEF) 1.5 g in dextrose 5 % 50 mL IVPB  Status:  Discontinued     1.5 g 100 mL/hr over 30 Minutes Intravenous To [facility] 04/23/15 1311 04/23/15 2017   04/23/15 0730  cefUROXime (ZINACEF) 1.5 g in dextrose 5 % 50 mL IVPB     1.5 g 100 mL/hr over 30 Minutes Intravenous To [facility] Surgical 04/22/15 2009 04/23/15 1734         V. Leia Alf, M.D. [facility] [place] Office: [phone] Pager:  [phone]

## 2015-04-28 LAB — BASIC METABOLIC PANEL
Anion gap: 8 (ref 5–15)
BUN: 6 mg/dL (ref 6–20)
CO2: 25 mmol/L (ref 22–32)
Calcium: 7.8 mg/dL — ABNORMAL LOW (ref 8.9–10.3)
Chloride: 104 mmol/L (ref 101–111)
Creatinine, Ser: 0.66 mg/dL (ref 0.61–1.24)
GFR calc Af Amer: >60 mL/min (ref >60)
GLUCOSE: 264 mg/dL — AB (ref 65–99)
POTASSIUM: 3 mmol/L — AB (ref 3.5–5.1)
Sodium: 137 mmol/L (ref 135–145)

## 2015-04-28 LAB — GLUCOSE, CAPILLARY
GLUCOSE-CAPILLARY: 113 mg/dL — AB (ref 65–99)
GLUCOSE-CAPILLARY: 134 mg/dL — AB (ref 65–99)
GLUCOSE-CAPILLARY: 161 mg/dL — AB (ref 65–99)
GLUCOSE-CAPILLARY: 238 mg/dL — AB (ref 65–99)
Glucose-Capillary: 140 mg/dL — ABNORMAL HIGH (ref 65–99)
Glucose-Capillary: 111 mg/dL — ABNORMAL HIGH (ref 65–99)
Glucose-Capillary: 135 mg/dL — ABNORMAL HIGH (ref 65–99)

## 2015-04-28 LAB — CBC
HCT: 21.5 % — ABNORMAL LOW (ref 39.0–52.0)
Hemoglobin: 7.1 g/dL — ABNORMAL LOW (ref 13.0–17.0)
MCH: 27.1 pg (ref 26.0–34.0)
MCHC: 33 g/dL (ref 30.0–36.0)
MCV: 82.1 fL (ref 78.0–100.0)
PLATELETS: 179 10*3/uL (ref 150–400)
RBC: 2.62 MIL/uL — AB (ref 4.22–5.81)
RDW: 13.9 % (ref 11.5–15.5)
WBC: 8.5 10*3/uL (ref 4.0–10.5)

## 2015-04-28 LAB — PHOSPHORUS: PHOSPHORUS: 3.4 mg/dL (ref 2.5–4.6)

## 2015-04-28 LAB — PREPARE RBC (CROSSMATCH)

## 2015-04-28 LAB — MAGNESIUM: Magnesium: 1.5 mg/dL — ABNORMAL LOW (ref 1.7–2.4)

## 2015-04-28 MED ORDER — GABAPENTIN 300 MG PO CAPS
300.0000 mg | ORAL_CAPSULE | Freq: Three times a day (TID) | ORAL | Status: DC
  Administered 2015-04-28 – 2015-05-01 (×10): 300 mg via ORAL
  Filled 2015-04-28 (×11): qty 1

## 2015-04-28 MED ORDER — POTASSIUM CHLORIDE 10 MEQ/50ML IV SOLN
10.0000 meq | INTRAVENOUS | Status: AC
  Administered 2015-04-28 (×3): 10 meq via INTRAVENOUS
  Filled 2015-04-28 (×3): qty 50

## 2015-04-28 MED ORDER — CLOZAPINE 100 MG PO TABS: 100.0000 mg | ORAL_TABLET | Freq: Three times a day (TID) | ORAL | Status: DC

## 2015-04-28 MED ORDER — DICYCLOMINE HCL 20 MG PO TABS
20.0000 mg | ORAL_TABLET | Freq: Two times a day (BID) | ORAL | Status: DC | PRN
  Filled 2015-04-28: qty 1

## 2015-04-28 MED ORDER — SIMVASTATIN 10 MG PO TABS
5.0000 mg | ORAL_TABLET | Freq: Every day | ORAL | Status: DC
  Administered 2015-04-28 – 2015-04-30 (×3): 5 mg via ORAL
  Filled 2015-04-28 (×3): qty 1

## 2015-04-28 MED ORDER — HYDROCHLOROTHIAZIDE 25 MG PO TABS
25.0000 mg | ORAL_TABLET | Freq: Every day | ORAL | Status: DC
  Administered 2015-04-28 – 2015-05-01 (×4): 25 mg via ORAL
  Filled 2015-04-28 (×4): qty 1

## 2015-04-28 MED ORDER — CLOZAPINE 100 MG PO TABS
100.0000 mg | ORAL_TABLET | Freq: Two times a day (BID) | ORAL | Status: DC
  Administered 2015-04-28 – 2015-04-29 (×3): 100 mg via ORAL
  Filled 2015-04-28 (×10): qty 1

## 2015-04-28 MED ORDER — LABETALOL HCL 5 MG/ML IV SOLN
5.0000 mg | INTRAVENOUS | Status: DC | PRN
  Administered 2015-04-28: 5 mg via INTRAVENOUS
  Filled 2015-04-28 (×2): qty 4

## 2015-04-28 MED ORDER — ALBUTEROL SULFATE HFA 108 (90 BASE) MCG/ACT IN AERS: 2.0000 | INHALATION_SPRAY | RESPIRATORY_TRACT | Status: DC | PRN

## 2015-04-28 MED ORDER — OXYCODONE-ACETAMINOPHEN 5-325 MG PO TABS
1.0000 | ORAL_TABLET | ORAL | Status: DC | PRN
Start: 2015-04-28 — End: 2015-05-01

## 2015-04-28 MED ORDER — SODIUM CHLORIDE 0.9 % IV SOLN
Freq: Once | INTRAVENOUS | Status: AC
  Administered 2015-04-28: 09:00:00 via INTRAVENOUS

## 2015-04-28 MED ORDER — CLOZAPINE 100 MG PO TABS
400.0000 mg | ORAL_TABLET | Freq: Every day | ORAL | Status: DC
  Administered 2015-04-28 – 2015-05-01 (×2): 400 mg via ORAL
  Filled 2015-04-28 (×5): qty 4

## 2015-04-28 MED ORDER — SENNA 8.6 MG PO TABS
1.0000 | ORAL_TABLET | Freq: Two times a day (BID) | ORAL | Status: DC
Start: 2015-04-28 — End: 2015-05-01
  Administered 2015-04-28 – 2015-05-01 (×6): 8.6 mg via ORAL
  Filled 2015-04-28 (×7): qty 1

## 2015-04-28 MED ORDER — IPRATROPIUM-ALBUTEROL 0.5-2.5 (3) MG/3ML IN SOLN
3.0000 mL | Freq: Three times a day (TID) | RESPIRATORY_TRACT | Status: DC
  Administered 2015-04-28 – 2015-04-30 (×5): 3 mL via RESPIRATORY_TRACT
  Filled 2015-04-28 (×7): qty 3

## 2015-04-28 MED ORDER — LORAZEPAM 1 MG PO TABS
1.0000 mg | ORAL_TABLET | Freq: Four times a day (QID) | ORAL | Status: DC | PRN
  Filled 2015-04-28: qty 1

## 2015-04-28 MED ORDER — LISINOPRIL 10 MG PO TABS
20.0000 mg | ORAL_TABLET | Freq: Every day | ORAL | Status: DC
  Administered 2015-04-28 – 2015-05-01 (×4): 20 mg via ORAL
  Filled 2015-04-28 (×2): qty 2
  Filled 2015-04-28: qty 1
  Filled 2015-04-28: qty 2

## 2015-04-28 MED ORDER — GUAIFENESIN 100 MG/5ML PO SYRP
200.0000 mg | ORAL_SOLUTION | Freq: Four times a day (QID) | ORAL | Status: DC | PRN
  Filled 2015-04-28: qty 10

## 2015-04-28 MED ORDER — DONEPEZIL HCL 5 MG PO TABS
5.0000 mg | ORAL_TABLET | Freq: Every day | ORAL | Status: DC
  Administered 2015-04-28 – 2015-04-30 (×2): 5 mg via ORAL
  Filled 2015-04-28 (×2): qty 1

## 2015-04-28 MED ORDER — LACTULOSE 10 GM/15ML PO SOLN
30.0000 g | Freq: Two times a day (BID) | ORAL | Status: DC
  Administered 2015-04-28 – 2015-04-29 (×4): 30 g via ORAL
  Administered 2015-05-01: 10 g via ORAL
  Filled 2015-04-28 (×9): qty 45

## 2015-04-28 MED ORDER — FUROSEMIDE 10 MG/ML IJ SOLN
20.0000 mg | Freq: Every day | INTRAMUSCULAR | Status: AC
  Administered 2015-04-28 – 2015-04-30 (×3): 20 mg via INTRAVENOUS
  Filled 2015-04-28 (×3): qty 2

## 2015-04-28 MED ORDER — POTASSIUM CHLORIDE 10 MEQ/50ML IV SOLN
10.0000 meq | INTRAVENOUS | Status: AC
  Administered 2015-04-28 (×2): 10 meq via INTRAVENOUS
  Filled 2015-04-28 (×2): qty 50

## 2015-04-28 MED ORDER — MAGNESIUM SULFATE 4 GM/100ML IV SOLN
4.0000 g | Freq: Every day | INTRAVENOUS | Status: DC | PRN
  Filled 2015-04-28: qty 100

## 2015-04-28 MED ORDER — IPRATROPIUM-ALBUTEROL 0.5-2.5 (3) MG/3ML IN SOLN: 3.0000 mL | Freq: Three times a day (TID) | RESPIRATORY_TRACT | Status: DC

## 2015-04-28 MED ORDER — HALOPERIDOL 5 MG PO TABS
10.0000 mg | ORAL_TABLET | Freq: Every day | ORAL | Status: DC
  Administered 2015-04-28 – 2015-05-01 (×2): 10 mg via ORAL
  Filled 2015-04-28 (×5): qty 2

## 2015-04-28 MED ORDER — METOPROLOL TARTRATE 25 MG PO TABS
25.0000 mg | ORAL_TABLET | Freq: Two times a day (BID) | ORAL | Status: DC
Start: 2015-04-28 — End: 2015-05-01
  Administered 2015-04-28 – 2015-05-01 (×7): 25 mg via ORAL
  Filled 2015-04-28 (×8): qty 1

## 2015-04-28 MED ORDER — PANTOPRAZOLE SODIUM 40 MG PO TBEC
40.0000 mg | DELAYED_RELEASE_TABLET | Freq: Every day | ORAL | Status: DC
  Administered 2015-04-28 – 2015-05-01 (×4): 40 mg via ORAL
  Filled 2015-04-28 (×4): qty 1

## 2015-04-28 MED ORDER — POLYETHYLENE GLYCOL 3350 17 G PO PACK: 17.0000 g | PACK | Freq: Every day | ORAL | Status: DC | PRN

## 2015-04-28 MED ORDER — LORAZEPAM 1 MG PO TABS
1.0000 mg | ORAL_TABLET | Freq: Two times a day (BID) | ORAL | Status: AC
  Administered 2015-04-28 (×2): 1 mg via ORAL
  Filled 2015-04-28 (×3): qty 1

## 2015-04-28 MED ORDER — METFORMIN HCL 500 MG PO TABS
500.0000 mg | ORAL_TABLET | Freq: Two times a day (BID) | ORAL | Status: DC
  Administered 2015-04-28 – 2015-04-30 (×5): 500 mg via ORAL
  Filled 2015-04-28 (×7): qty 1

## 2015-04-28 MED ORDER — ADULT MULTIVITAMIN W/MINERALS CH
1.0000 | ORAL_TABLET | Freq: Every day | ORAL | Status: DC
  Administered 2015-04-28 – 2015-05-01 (×4): 1 via ORAL
  Filled 2015-04-28 (×4): qty 1

## 2015-04-28 MED ORDER — INSULIN ASPART 100 UNIT/ML ~~LOC~~ SOLN
0.0000 [IU] | Freq: Three times a day (TID) | SUBCUTANEOUS | Status: DC
  Administered 2015-04-28: 2 [IU] via SUBCUTANEOUS
  Administered 2015-04-28: 1 [IU] via SUBCUTANEOUS
  Administered 2015-04-29: 2 [IU] via SUBCUTANEOUS
  Administered 2015-04-29 – 2015-05-01 (×4): 1 [IU] via SUBCUTANEOUS

## 2015-04-28 MED ORDER — LORAZEPAM 1 MG PO TABS: 1.0000 mg | ORAL_TABLET | Freq: Two times a day (BID) | ORAL | Status: DC | PRN

## 2015-04-28 MED ORDER — TRIHEXYPHENIDYL HCL 5 MG PO TABS
5.0000 mg | ORAL_TABLET | Freq: Two times a day (BID) | ORAL | Status: DC
  Administered 2015-04-28 – 2015-05-01 (×6): 5 mg via ORAL
  Filled 2015-04-28 (×10): qty 1

## 2015-04-28 MED ORDER — ALBUTEROL SULFATE (2.5 MG/3ML) 0.083% IN NEBU: 2.5000 mg | INHALATION_SOLUTION | RESPIRATORY_TRACT | Status: DC | PRN

## 2015-04-28 NOTE — Progress Notes (Addendum)
[facility] [place]  Subjective  - Doing well.  He states he has passed flatus multiple times.   Objective 140/61 107 99.9 F (37.7 C) (Oral) 25 95%  Intake/Output Summary (Last 24 hours) at 04/28/15 0722 Last data filed at 04/28/15 0649  Gross per 24 hour  Intake   1120 ml  Output   1345 ml  Net   -225 ml    Abdomin slightly distended, NTTP, + BS Doppler biphasic DP/PT bil. Right 5th toe incisions healing well, no drainage Left leg incisions healing well  Assessment/Planning: POD # 5 PROCEDURE: 1. Aortobifemoral bypass (14 x 8 mm) 2. Construction of composite graft 3. Right common femoral artery to posterior tibial artery with composite graft 4. Left common femoral artery to superficial femoral artery bypass with reversed ipsilateral greater saphenous vein  Right fifth toe amputation  HGB 7.1 will transfuse 2 units PRBC   Starting clear liquid diet Start PO medication today and possible transfer to 2W Hard sole shoe right for weight bearing as tolerates   Theda Sers, EMMA MAUREEN 04/28/2015 7:22 AM --  Laboratory Lab Results:  Recent Labs  04/27/15 1235 04/28/15 0416  WBC 11.4* 8.5  HGB 7.7* 7.1*  HCT 23.7* 21.5*  PLT 169 179   BMET  Recent Labs  04/27/15 0409 04/28/15 0416  NA 138 137  K 3.1* 3.0*  CL 103 104  CO2 25 25  GLUCOSE 150* 264*  BUN 9 6  CREATININE 0.68 0.66  CALCIUM 8.3* 7.8*    COAG Lab Results  Component Value Date   INR 1.30 04/23/2015   INR 1.08 04/18/2015   INR 1.09 04/12/2015   No results found for: PTT   Addendum  I have independently interviewed and examined the patient, and I agree with the physician assistant's findings.  Warm feet: R PT easily palpable, L PT faintly palpable.  Ok to resume PO regimen.  H/H has been drifting.  Doubt active bleeding.  Would give 2 unit pRBC.  Once transfusion complete and PO psychiatric medications load, ok to transfer to floor.  Will need bowel regimen  to encourage BM given significant colonic distension, likely related to either chronic constipation or Ogilive's.  Still +7-8 L, Lasix 20 mg IV daily x 3 days.  Check K daily.   Adele Barthel, MD [facility] [place] Office: [phone] Pager: [phone]  04/28/2015, 7:53 AM

## 2015-04-28 NOTE — Progress Notes (Signed)
PULMONARY / CRITICAL CARE MEDICINE   Name: Craig Bentley MRN: [id number] DOB: December 10, 1955    ADMISSION DATE:  04/23/2015 CONSULTATION DATE:  04/23/2015  REFERRING MD:  Dr. Bridgett Larsson  CHIEF COMPLAINT:  Leg pain  SUBJECTIVE:  Farting well  VITAL SIGNS: BP 185/70 mmHg  Pulse 113  Temp(Src) 98.5 F (36.9 C) (Oral)  Resp 31  Ht 5' 11.5" (1.816 m)  Wt 83.2 kg (183 lb 6.8 oz)  BMI 25.23 kg/m2  SpO2 97%  INTAKE / OUTPUT: I/O last 3 completed shifts: In: 5053 [I.V.:1442; IV Piggyback:300] Out: 9767 [HALPF:7902; Emesis/NG output:25]  PHYSICAL EXAMINATION: General:  Pleasant, calm in chair Neuro:  Follows  commands HEENT:  No stridor, jvd wnl Cardiovascular: s1 s2 regular, no murmur Lungs:  CTA to bases, improved effort Abdomen:  Soft, non tender, low BS, some distention improved, no pain Musculoskeletal:  Wound sites clean Skin: no rashes  LABS:  BMET  Recent Labs Lab 04/26/15 0430 04/27/15 0409 04/28/15 0416  NA 137 138 137  K 3.1* 3.1* 3.0*  CL 102 103 104  CO2 '26 25 25  '$ BUN '7 9 6  '$ CREATININE 0.59* 0.68 0.66  GLUCOSE 164* 150* 264*    Electrolytes  Recent Labs Lab 04/24/15 0405 04/25/15 0426 04/26/15 0430 04/27/15 0409 04/28/15 0416  CALCIUM 8.0* 7.9* 8.3* 8.3* 7.8*  MG 1.1* 1.5*  --   --  1.5*  PHOS  --   --   --   --  3.4    CBC  Recent Labs Lab 04/27/15 0409 04/27/15 1235 04/28/15 0416  WBC 11.8* 11.4* 8.5  HGB 7.8* 7.7* 7.1*  HCT 24.3* 23.7* 21.5*  PLT 166 169 179    Coag's  Recent Labs Lab 04/23/15 2050  APTT 35  INR 1.30    ABG  Recent Labs Lab 04/23/15 1503 04/23/15 2048 04/24/15 0406  PHART 7.456* 7.381 7.444  PCO2ART 34.5* 41.4 38.0  PO2ART 141.0* 89.0 104*    Liver Enzymes  Recent Labs Lab 04/24/15 0405  AST 20  ALT 11*  ALKPHOS 34*  BILITOT 0.8  ALBUMIN 2.8*    Cardiac Enzymes  Recent Labs Lab 04/23/15 2050 04/24/15 0405 04/24/15 0859  TROPONINI <0.03 <0.03 <0.03    Glucose  Recent Labs Lab  04/27/15 1203 04/27/15 1645 04/27/15 2001 04/28/15 0018 04/28/15 0355 04/28/15 0822  GLUCAP 116* 123* 135* 113* 140* 111*    Imaging No results found.   STUDIES:   CULTURES:  ANTIBIOTICS:  SIGNIFICANT EVENTS: 3/01 Admit, aortobifemoral bypass, Rt common femoral to posterior tibial bypass, Rt fifth toe amputation 3/02 Extubated; agitated after extubation >> improved with precedex  LINES/TUBES: 3/01 Rt IJ Introducer >> 3/01 ETT >> 3/02   3/01 Rt radial aline >> 3/02  DISCUSSION: 60 yo male smoker with non healing Rt foot wound and PAD.  He had aortobifemoral bypass, Rt common femoral to posterior tibial bypass, Rt fifth toe amputation on 3/01.  He remained on vent post op and PCCM consulted.  He has PMHx of HTN, HLD, DM, COPD, Schizophrenia, Anxiety  ASSESSMENT / PLAN:  PULMONARY A: Acute respiratory failure. Hx of COPD. ATX on pcxr resolving P:   Continue duoneb IS Upright in chair Treating ileus  CARDIOVASCULAR A:  S/p aortobifemoral bypass, Rt common femoral to posterior tibial bypass, Rt fifth toe amputation 3/01. Hx of HTN, HLD. P:  HCTZ, lisinopril, lopressor, zocor added today Prn labetalol,   RENAL A:   Hypokalemia. hypomag P:   k and mag supp Re assess  in am  Did well with lasix, maintain, but if ileus not resolved hold  GASTROINTESTINAL A:   Nutrition. Ileus likely Passing gas P:   NPO, clears k , mag supp  HEMATOLOGIC A:   Anemia. P:  For 2 units per vss Lovenox for DVT until ambulation  INFECTIOUS A:   Post op prophylaxis >> completed 3/02. P:   Monitor clinically for fevers  ENDOCRINE A:   DM type II P:   SSI  NEUROLOGIC A:   Hx of schizophrenia, anxiety Delirium, agitation  P:   Resume clozaril, aricept, neurontin, oral haldol, artane, bentyl as able to take orals Dc precedex from mar, avoid restart IV haldol and ativan to oral Get QTC daily  consider transfer out Will call [facility] to pick up medical  consultant care  Lavon Paganini. Titus Mould, MD, [place] Pgr: [facility]

## 2015-04-28 NOTE — Significant Event (Signed)
Transferred patient to 2W08 at the requests of patient's primary RN. Patient taken to new room via wheelchair. VS stable during transport. Patient settled in bed per his requests. Patient has all his belongings at bedside in new room (clothes). Staff in room prior to RN leaving.

## 2015-04-28 NOTE — Progress Notes (Signed)
Orthopedic Tech Progress Note Patient Details:  Craig Bentley 14-Dec-1955 [id number]  Ortho Devices Type of Ortho Device: Postop shoe/boot Ortho Device/Splint Interventions: Application   [name] 04/28/2015, 11:49 AM

## 2015-04-28 NOTE — Progress Notes (Signed)
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 No s/p prolonged vascular procedures with multiple bypass and amputation of R 5th toe. PMH: COPD, HTN, schizophrenia, NIIDM.     PT Comments    Patient progressing with mobility despite pain and lethargy from medications. Also demonstrates elevated HR and unknown SpO2 ambulating on RA due to poor signal. Feel he will benefit from continued skilled PT in the acute setting prior to d/c to SNF.  Follow Up Recommendations  SNF     Equipment Recommendations       Recommendations for Other Services       Precautions / Restrictions Precautions Precautions: Fall Restrictions Weight Bearing Restrictions: Yes RLE Weight Bearing: Weight bearing as tolerated Other Position/Activity Restrictions: in flat bottom shoe    Mobility  Bed Mobility Overal bed mobility: Needs Assistance Bed Mobility: Supine to Sit;Sit to Supine     Supine to sit: Min assist Sit to supine: Min assist;+2 for safety/equipment   General bed mobility comments: patient coming up on his own from sidelying assist to lift trunk upright  Transfers Overall transfer level: Needs assistance Equipment used: Rolling walker (2 wheeled) Transfers: Sit to/from Stand Sit to Stand: Mod assist;+2 safety/equipment;Min assist         General transfer comment: hard to stand due to sore in abdomen and needing cues for hand placement, had to sit due to urinary incontinence and replace socks; second attempt from higher surface with less assist  Ambulation/Gait Ambulation/Gait assistance: Min assist;Mod assist;+2 safety/equipment Ambulation Distance (Feet): 120 Feet Assistive device: Rolling walker (2 wheeled) Gait Pattern/deviations: Step-through pattern;Decreased stride length;Trunk flexed;Shuffle     General  Gait Details: difficulty extending trunk due to pain at incision.  needed cues and assist for maneuvering walker around obstacles safely; HR up to 139 and SpO2 not reading correctly due to hand on walker but walked on RA and no SOB noted   Stairs            Wheelchair Mobility    Modified Rankin (Stroke Patients Only)       Balance Overall balance assessment: Needs assistance         Standing balance support: Bilateral upper extremity supported Standing balance-Leahy Scale: Poor Standing balance comment: heavy UE support due to flexed posture and incision sore                     Cognition Arousal/Alertness: Lethargic;Suspect due to medications Behavior During Therapy: [facility] for tasks assessed/performed Overall Cognitive Status: Impaired/Different from baseline Area of Impairment: Attention;Problem solving   Current Attention Level: Selective   Following Commands: Follows multi-step commands with increased time;Follows multi-step commands inconsistently Safety/Judgement: Decreased awareness of deficits;Decreased awareness of safety     General Comments: more oriented this session    Exercises      General Comments        Pertinent Vitals/Pain Faces Pain Scale: Hurts little more Pain Location: abdomen Pain Descriptors / Indicators: Aching;Sore Pain Intervention(s): Monitored during session;Limited activity within patient's tolerance    Home Living                      Prior Function            PT Goals (current goals can now be found in the care plan section) Progress towards PT goals: Progressing  toward goals    Frequency  Min 3X/week    PT Plan Current plan remains appropriate    Co-evaluation             End of Session Equipment Utilized During Treatment: Gait belt Activity Tolerance: Patient limited by fatigue Patient left: in bed;with call bell/phone within reach;with bed alarm set     Time: 6659-9357 PT Time  Calculation (min) (ACUTE ONLY): 26 min  Charges:  $Gait Training: 8-22 mins $Therapeutic Activity: 8-22 mins                    G Codes:      Reginia Naas 05-06-2015, 5:04 PM  Magda Kiel, [place] 2015/05/06

## 2015-04-29 LAB — BASIC METABOLIC PANEL
Anion gap: 13 (ref 5–15)
BUN: 7 mg/dL (ref 6–20)
CALCIUM: 8.7 mg/dL — AB (ref 8.9–10.3)
CO2: 26 mmol/L (ref 22–32)
Chloride: 99 mmol/L — ABNORMAL LOW (ref 101–111)
Creatinine, Ser: 0.76 mg/dL (ref 0.61–1.24)
Glucose, Bld: 148 mg/dL — ABNORMAL HIGH (ref 65–99)
Potassium: 3.6 mmol/L (ref 3.5–5.1)
SODIUM: 138 mmol/L (ref 135–145)

## 2015-04-29 LAB — GLUCOSE, CAPILLARY
GLUCOSE-CAPILLARY: 96 mg/dL (ref 65–99)
GLUCOSE-CAPILLARY: 128 mg/dL — AB (ref 65–99)
GLUCOSE-CAPILLARY: 138 mg/dL — AB (ref 65–99)
Glucose-Capillary: 133 mg/dL — ABNORMAL HIGH (ref 65–99)
Glucose-Capillary: 118 mg/dL — ABNORMAL HIGH (ref 65–99)

## 2015-04-29 LAB — CBC
HCT: 29.7 % — ABNORMAL LOW (ref 39.0–52.0)
HEMOGLOBIN: 10 g/dL — AB (ref 13.0–17.0)
MCH: 28 pg (ref 26.0–34.0)
MCHC: 33.7 g/dL (ref 30.0–36.0)
MCV: 83.2 fL (ref 78.0–100.0)
Platelets: 189 10*3/uL (ref 150–400)
RBC: 3.57 MIL/uL — AB (ref 4.22–5.81)
RDW: 14 % (ref 11.5–15.5)
WBC: 11.9 10*3/uL — AB (ref 4.0–10.5)

## 2015-04-29 LAB — HEMOGLOBIN A1C
Hgb A1c MFr Bld: 6.3 % — ABNORMAL HIGH (ref 4.8–5.6)
MEAN PLASMA GLUCOSE: 134 mg/dL

## 2015-04-29 LAB — PHOSPHORUS: PHOSPHORUS: 4.3 mg/dL (ref 2.5–4.6)

## 2015-04-29 LAB — MAGNESIUM: MAGNESIUM: 1.6 mg/dL — AB (ref 1.7–2.4)

## 2015-04-29 MED ORDER — MAGNESIUM SULFATE 2 GM/50ML IV SOLN
2.0000 g | Freq: Once | INTRAVENOUS | Status: DC
  Filled 2015-04-29: qty 50

## 2015-04-29 MED ORDER — POLYETHYLENE GLYCOL 3350 17 G PO PACK
17.0000 g | PACK | Freq: Once | ORAL | Status: AC
  Administered 2015-04-29: 17 g via ORAL
  Filled 2015-04-29: qty 1

## 2015-04-29 MED ORDER — MAGNESIUM OXIDE 400 (241.3 MG) MG PO TABS
800.0000 mg | ORAL_TABLET | Freq: Once | ORAL | Status: DC
Start: 2015-04-29 — End: 2015-05-01
  Filled 2015-04-29 (×4): qty 2

## 2015-04-29 MED ORDER — POTASSIUM CHLORIDE CRYS ER 20 MEQ PO TBCR
40.0000 meq | EXTENDED_RELEASE_TABLET | Freq: Once | ORAL | Status: AC
  Administered 2015-04-29: 40 meq via ORAL
  Filled 2015-04-29: qty 2

## 2015-04-29 MED FILL — Heparin Sodium (Porcine) Inj 1000 Unit/ML: INTRAMUSCULAR | Qty: 30 | Status: AC

## 2015-04-29 MED FILL — Sodium Chloride IV Soln 0.9%: INTRAVENOUS | Qty: 2000 | Status: AC

## 2015-04-29 NOTE — Progress Notes (Signed)
Transducer d/c'd per order and per protocol. Pt tolerated procedure well. No complaints at this time. Call bell and phone within reach. Will continue to monitor.

## 2015-04-29 NOTE — Progress Notes (Addendum)
[facility] [place]  Subjective  - Doing better.  Has tolerated PO's well   Objective 130/69 101 99.2 F (37.3 C) (Oral) 18 92%  Intake/Output Summary (Last 24 hours) at 04/29/15 0811 Last data filed at 04/28/15 2239  Gross per 24 hour  Intake    405 ml  Output   1500 ml  Net  -1095 ml    DP palpable bilaterally Incisions healing well Abdomin distended, but soft with positive BS Groins soft Right 5 th toe incision healing well    Assessment/Planning: POD # 6 1. Aortobifemoral bypass (14 x 8 mm) 2. Construction of composite graft 3. Right common femoral artery to posterior tibial artery with composite graft 4. Left common femoral artery to superficial femoral artery bypass with reversed ipsilateral greater saphenous vein  Right fifth toe amputation  HGB 10 after 2 units PRBC WBC 11.9 Pending discharge to SNF  Theda Sers [facility] 04/29/2015 8:11 AM --  Laboratory Lab Results:  Recent Labs  04/28/15 0416 04/29/15 0245  WBC 8.5 11.9*  HGB 7.1* 10.0*  HCT 21.5* 29.7*  PLT 179 189   BMET  Recent Labs  04/28/15 0416 04/29/15 0245  NA 137 138  K 3.0* 3.6  CL 104 99*  CO2 25 26  GLUCOSE 264* 148*  BUN 6 7  CREATININE 0.66 0.76  CALCIUM 7.8* 8.7*    COAG Lab Results  Component Value Date   INR 1.30 04/23/2015   INR 1.08 04/18/2015   INR 1.09 04/12/2015   No results found for: PTT    Addendum  I have independently interviewed and examined the patient, and I agree with the physician assistant's findings.  Inc c/d/i, staples in place, mild erythema around staples in L leg (possible metal reaction), SNF per PT.  Will start the process today, pt should be ready to discharge in the next 1-2 days.  Awaiting return of diet to full and , increased ambulation.    - Miralax 1 packet x 1 to help with evacuating bowels - Bowel regimen will be important given this patient's ?colon dysmotility  Adele Barthel, MD [facility] [place] Office: [phone] Pager: [phone]  04/29/2015, 9:44 AM

## 2015-04-29 NOTE — Clinical Documentation Improvement (Signed)
Vascular Surgery  Can the diagnosis of Respiratory Failure be further specified?   Document Inclusion Of - Hypoxia, Hypercapnia, Combination of Both  Other  Clinically Undetermined  Document any associated diagnoses/conditions.   Supporting Information: Acute respiratory failure per 3/02 progress notes.   Please exercise your independent, professional judgment when responding. A specific answer is not anticipated or expected.   Thank You,  [place] [phone]

## 2015-04-29 NOTE — Clinical Documentation Improvement (Signed)
Vascular Surgery  Can the diagnosis of anemia be further specified?   Iron deficiency Anemia  Acute Blood Loss Anemia, including the suspected or known cause  Acute on Chronic Blood Loss Anemia, including the suspected or known cause  Nutritional anemia, including the nutrition or mineral deficits  Chronic Anemia, including the suspected or known cause  Anemia of chronic disease, including the associated chronic disease state  Other  Clinically Undetermined  Document any associated diagnoses/conditions.   Supporting Information: Anemia per 3/02 progress notes. EBL 900 ml per 3/01 Anesthesia record. Cell saver: 200 ml per 3/01 Anesthesia record. H/H: 3/04:    7.3/23.0. 3/01:  10.5/31.0.   Please exercise your independent, professional judgment when responding. A specific answer is not anticipated or expected.   Thank You,  [place] [phone]

## 2015-04-29 NOTE — Progress Notes (Signed)
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 No s/p prolonged vascular procedures with multiple bypass and amputation of R 5th toe. PMH: COPD, HTN, schizophrenia, NIIDM.    OT comments  Pt with lethargy. Decreased awareness of deficits, thinks he can return home and increase his walking on his own.  Reminded pt that he requires 2 person assist to stand. Focus of session on sitting EOB for grooming and UB ADL. Did not get up to chair due to RN planning to remove PICC. Encouraged incentive spirometer.  Follow Up Recommendations  SNF;Supervision/Assistance - 24 hour    Equipment Recommendations       Recommendations for Other Services      Precautions / Restrictions Precautions Precautions: Fall Restrictions RLE Weight Bearing: Weight bearing as tolerated Other Position/Activity Restrictions: in flat bottom shoe       Mobility Bed Mobility Overal bed mobility: Needs Assistance Bed Mobility: Rolling;Sidelying to Sit;Sit to Supine Rolling: Min assist Sidelying to sit: Min assist   Sit to supine: Min assist   General bed mobility comments: encourage to log roll to minimize pain, used rail, HOB up 30 degrees  Transfers                 General transfer comment: returned to bed to have PICC line removed    Balance     Sitting balance-Leahy Scale: Fair                             ADL Overall ADL's : Needs assistance/impaired Eating/Feeding: Set up;Bed level   Grooming: Wash/dry hands;Wash/dry face;Min guard;Sitting   Upper Body Bathing: Moderate assistance;Sitting       Upper Body Dressing : Minimal assistance;Sitting                            Vision                     Perception     Praxis      Cognition   Behavior During Therapy: Flat  affect Overall Cognitive Status: Impaired/Different from baseline Area of Impairment: Attention;Safety/judgement;Problem solving;Orientation Orientation Level: Situation (thought he was here because of seizures) Current Attention Level: Sustained (due to lethargy) Memory: Decreased short-term memory  Following Commands: Follows one step commands with increased time;Follows multi-step commands inconsistently Safety/Judgement: Decreased awareness of deficits;Decreased awareness of safety   Problem Solving: Slow processing      Extremity/Trunk Assessment               Exercises     Shoulder Instructions       General Comments      Pertinent Vitals/ Pain       Pain Assessment: Faces Faces Pain Scale: Hurts little more Pain Location: abdominal incision Pain Descriptors / Indicators: Sore;Grimacing Pain Intervention(s): Repositioned;Premedicated before session  Home Living                                          Prior Functioning/Environment              Frequency Min 2X/week     Progress Toward Goals  OT Goals(current goals can now  be found in the care plan section)  Progress towards OT goals: Progressing toward goals  Acute Rehab OT Goals Patient Stated Goal: go home  Plan Discharge plan remains appropriate    Co-evaluation                 End of Session     Activity Tolerance Patient limited by lethargy   Patient Left in bed;with call bell/phone within reach;with bed alarm set   Nurse Communication          Time: 1203-1226 OT Time Calculation (min): 23 min  Charges: OT General Charges $OT Visit: 1 Procedure OT Treatments $Self Care/Home Management : 23-37 mins  [name] 04/29/2015, 1:08 PM  [phone]

## 2015-04-29 NOTE — Progress Notes (Addendum)
Pt increasingly nauseous. Pt has active bowel sounds, abdomen soft and distended, pt reportedly passing gas. Green emesis x4. Given PRN medcation with no relief. Will continue to monitor closely.  Raliegh Ip RN

## 2015-04-29 NOTE — Progress Notes (Addendum)
[facility] PROGRESS NOTE  Craig Bentley EXN:[id number] DOB: December 15, 1955 DOA: 04/23/2015 PCP: Mabeline Caras, NP  Assessment/Plan:  Schizophrenia: cooperative and calm on home regimen  Essential HTN: controlled on home regimen  DM controlled  Constipation, concern for ogilve's. Passing gas, but no stool. On bowel regimen. Mag 1.6. Potassium 3.6. Repletion ordered. Pushing mag above 2 and K toward 5 will help.  S/p aortobifemoral bypass, right common femoral to posterior tibial bypass, right 5th toe amputation  Anemia: improved post transfusion  HPI/Subjective: Eating ok. No stool. No other complaints  Objective: Filed Vitals:   04/29/15 1330 04/29/15 1510  BP: 139/63   Pulse: 105 110  Temp: 98.8 F (37.1 C)   Resp: 18 20    Intake/Output Summary (Last 24 hours) at 04/29/15 1614 Last data filed at 04/29/15 1339  Gross per 24 hour  Intake    170 ml  Output    825 ml  Net   -655 ml   Filed Weights   04/23/15 0704 04/24/15 0500  Weight: 79.833 kg (176 lb) 83.2 kg (183 lb 6.8 oz)    Exam:   General:  A and o.   Cardiovascular: RRR without MGR  Respiratory: CTA without WRR  Abdomen: distended. Bowel sounds hypoactive  Ext: no pitting edema  Psych: calm and cooperative with normal affect  Basic Metabolic Panel:  Recent Labs Lab 04/23/15 2050 04/24/15 0405 04/25/15 0426 04/26/15 0430 04/27/15 0409 04/28/15 0416 04/29/15 0245  NA 137 137 138 137 138 137 138  K 4.0 3.9 3.4* 3.1* 3.1* 3.0* 3.6  CL 101 105 103 102 103 104 99*  CO2 '24 26 25 26 25 25 26  '$ GLUCOSE 250* 159* 139* 164* 150* 264* 148*  BUN '8 8 10 7 9 6 7  '$ CREATININE 0.73 0.75 0.72 0.59* 0.68 0.66 0.76  CALCIUM 8.1* 8.0* 7.9* 8.3* 8.3* 7.8* 8.7*  MG 1.0* 1.1* 1.5*  --   --  1.5* 1.6*  PHOS  --   --   --   --   --  3.4 4.3   Liver Function Tests:  Recent Labs Lab 04/24/15 0405  AST 20  ALT 11*  ALKPHOS 34*  BILITOT 0.8  PROT 5.2*  ALBUMIN 2.8*    Recent Labs Lab  04/24/15 0405  AMYLASE 52   No results for input(s): AMMONIA in the last 168 hours. CBC:  Recent Labs Lab 04/26/15 0430 04/27/15 0409 04/27/15 1235 04/28/15 0416 04/29/15 0245  WBC 12.0* 11.8* 11.4* 8.5 11.9*  NEUTROABS  --   --  8.0*  --   --   HGB 7.3* 7.8* 7.7* 7.1* 10.0*  HCT 23.0* 24.3* 23.7* 21.5* 29.7*  MCV 83.0 80.7 80.3 82.1 83.2  PLT 139* 166 169 179 189   Cardiac Enzymes:  Recent Labs Lab 04/23/15 2050 04/24/15 0405 04/24/15 0859  TROPONINI <0.03 <0.03 <0.03   BNP (last 3 results)  Recent Labs  02/13/15 1752  BNP 5.9    ProBNP (last 3 results) No results for input(s): PROBNP in the last 8760 hours.  CBG:  Recent Labs Lab 04/28/15 1559 04/28/15 1816 04/28/15 2121 04/29/15 0653 04/29/15 1118  GLUCAP 96 161* 134* 133* 118*    No results found for this or any previous visit (from the past 240 hour(s)).   Studies: No results found.  Scheduled Meds: . antiseptic oral rinse  7 mL Mouth Rinse q12n4p  . chlorhexidine  15 mL Mouth Rinse BID  . cloZAPine  100 mg Oral BID WC  And  . cloZAPine  400 mg Oral QHS  . docusate sodium  100 mg Oral Daily  . donepezil  5 mg Oral QHS  . enoxaparin (LOVENOX) injection  40 mg Subcutaneous Q24H  . furosemide  20 mg Intravenous Daily  . gabapentin  300 mg Oral TID  . haloperidol  10 mg Oral QHS  . hydrochlorothiazide  25 mg Oral Daily  . insulin aspart  0-9 Units Subcutaneous TID WC  . ipratropium-albuterol  3 mL Nebulization TID  . lactulose  30 g Oral BID  . lisinopril  20 mg Oral Daily  . magnesium oxide  800 mg Oral Once  . metFORMIN  500 mg Oral BID WC  . metoprolol tartrate  25 mg Oral BID  . multivitamin with minerals  1 tablet Oral Daily  . pantoprazole  40 mg Oral Daily  . senna  1 tablet Oral BID  . simvastatin  5 mg Oral QHS  . trihexyphenidyl  5 mg Oral BID WC   Continuous Infusions:   Time spent: 25 minutes  [facility] [email or web address], password  [facility] 04/29/2015, 4:14 PM  LOS: 6 days

## 2015-04-29 NOTE — Clinical Social Work Note (Signed)
Clinical Social Work Assessment  Patient Details  Name: Craig Bentley MRN: [id number] Date of Birth: 1955-12-22  Date of referral:  04/29/15               Reason for consult:  Facility Placement                Permission sought to share information with:  Family Supports, Chartered certified accountant granted to share information::  Yes, Verbal Permission Granted  Name::     Best boy::  [place]  Relationship::  sister  Contact Information:     Housing/Transportation Living arrangements for the past 2 months:  [place] of Information:  Patient, Facility Patient Interpreter Needed:  None Criminal Activity/Legal Involvement Pertinent to Current Situation/Hospitalization:  No - Comment as needed Significant Relationships:  Siblings Lives with:  Facility Resident Do you feel safe going back to the place where you live?  Yes Need for family participation in patient care:  No (Coment)  Care giving concerns:  None- pt feels comfortable returning to ALF with assistance from staff there.   Social Worker assessment / plan:  CSW spoke with pt concerning PT recommendation for SNF prior to return to ALF.  Pt has been long term resident at ALF and states that he gets good care there.  Employment status:  Disabled (Comment on whether or not currently receiving Disability) Insurance information:  Medicaid In [place] PT Recommendations:  [place] / Referral to community resources:  [place]  Patient/Family's Response to care: Pt is not agreeable to SNF placement and would like to return to ALF if they are able to handle his needs there.  Patient/Family's Understanding of and Emotional Response to Diagnosis, Current Treatment, and Prognosis:  No questions or concerns at this time with care plan- pt is just hopeful that there will not be any barriers to his return to ALF.  Emotional Assessment Appearance:   Appears stated age Attitude/Demeanor/Rapport:    Affect (typically observed):  Appropriate, Accepting Orientation:  Oriented to Self, Oriented to Place, Oriented to  Time, Oriented to Situation Alcohol / Substance use:  Not Applicable Psych involvement (Current and /or in the community):  No (Comment)  Discharge Needs  Concerns to be addressed:  Care Coordination Readmission within the last 30 days:  Yes Current discharge risk:  Physical Impairment Barriers to Discharge:  Continued Medical Work up   [employer], LCSW 04/29/2015, 10:16 AM

## 2015-04-30 ENCOUNTER — Inpatient Hospital Stay (HOSPITAL_COMMUNITY): Payer: Medicaid Other

## 2015-04-30 DIAGNOSIS — E0840 Diabetes mellitus due to underlying condition with diabetic neuropathy, unspecified: Secondary | ICD-10-CM

## 2015-04-30 DIAGNOSIS — I10 Essential (primary) hypertension: Secondary | ICD-10-CM

## 2015-04-30 LAB — CREATININE, SERUM
Creatinine, Ser: 0.77 mg/dL (ref 0.61–1.24)
GFR calc Af Amer: >60 mL/min (ref >60)
GFR calc non Af Amer: >60 mL/min (ref >60)

## 2015-04-30 LAB — GLUCOSE, CAPILLARY
GLUCOSE-CAPILLARY: 126 mg/dL — AB (ref 65–99)
Glucose-Capillary: 134 mg/dL — ABNORMAL HIGH (ref 65–99)
Glucose-Capillary: 122 mg/dL — ABNORMAL HIGH (ref 65–99)
Glucose-Capillary: 175 mg/dL — ABNORMAL HIGH (ref 65–99)

## 2015-04-30 LAB — COMPREHENSIVE METABOLIC PANEL
ALK PHOS: 54 U/L (ref 38–126)
ALT: 21 U/L (ref 17–63)
AST: 17 U/L (ref 15–41)
Albumin: 2.5 g/dL — ABNORMAL LOW (ref 3.5–5.0)
Anion gap: 11 (ref 5–15)
BILIRUBIN TOTAL: 0.7 mg/dL (ref 0.3–1.2)
BUN: 15 mg/dL (ref 6–20)
CALCIUM: 9.1 mg/dL (ref 8.9–10.3)
CHLORIDE: 96 mmol/L — AB (ref 101–111)
CO2: 32 mmol/L (ref 22–32)
CREATININE: 0.83 mg/dL (ref 0.61–1.24)
Glucose, Bld: 153 mg/dL — ABNORMAL HIGH (ref 65–99)
Potassium: 4.1 mmol/L (ref 3.5–5.1)
Sodium: 139 mmol/L (ref 135–145)
Total Protein: 6.1 g/dL — ABNORMAL LOW (ref 6.5–8.1)

## 2015-04-30 LAB — CBC
HEMATOCRIT: 33.1 % — AB (ref 39.0–52.0)
HEMOGLOBIN: 10.5 g/dL — AB (ref 13.0–17.0)
MCH: 26.9 pg (ref 26.0–34.0)
MCHC: 31.7 g/dL (ref 30.0–36.0)
MCV: 84.9 fL (ref 78.0–100.0)
Platelets: 270 10*3/uL (ref 150–400)
RBC: 3.9 MIL/uL — AB (ref 4.22–5.81)
RDW: 14.5 % (ref 11.5–15.5)
WBC: 11 10*3/uL — ABNORMAL HIGH (ref 4.0–10.5)

## 2015-04-30 LAB — MAGNESIUM: Magnesium: 2.2 mg/dL (ref 1.7–2.4)

## 2015-04-30 MED ORDER — FLEET ENEMA 7-19 GM/118ML RE ENEM: 1.0000 | ENEMA | Freq: Once | RECTAL | Status: DC

## 2015-04-30 MED ORDER — IPRATROPIUM-ALBUTEROL 0.5-2.5 (3) MG/3ML IN SOLN: 3.0000 mL | RESPIRATORY_TRACT | Status: DC | PRN

## 2015-04-30 MED ORDER — POLYETHYLENE GLYCOL 3350 17 G PO PACK
17.0000 g | PACK | Freq: Two times a day (BID) | ORAL | Status: DC
  Administered 2015-04-30 – 2015-05-01 (×2): 17 g via ORAL
  Filled 2015-04-30 (×2): qty 1

## 2015-04-30 NOTE — Progress Notes (Addendum)
[facility] [place]  Subjective  - He has vomited multiple times this am.  He has not had a BM since surgery and has history of sever constipation.  He reports no nausea at the present time.   Objective 157/69 95 98.8 F (37.1 C) (Oral) 20 91%  Intake/Output Summary (Last 24 hours) at 04/30/15 0818 Last data filed at 04/30/15 0443  Gross per 24 hour  Intake    170 ml  Output    600 ml  Net   -430 ml    Incisions healing well Abdomin soft distended, positive BS Palpable DP pulses bilaterally Right 5th toe amp healing well  Assessment/Planning: POD # 7 1. Aortobifemoral bypass (14 x 8 mm) 2. Construction of composite graft 3. Right common femoral artery to posterior tibial artery with composite graft 4. Left common femoral artery to superficial femoral artery bypass with reversed ipsilateral greater saphenous vein  Right fifth toe amputation Received magnesium last night He received dulcolax this am at 7 and will get daily stool softeners Mobility needs to be encouraged He is still 2 person assistance for mobility      Laurence Slate [facility] 04/30/2015 8:18 AM --  Laboratory Lab Results:  Recent Labs  04/28/15 0416 04/29/15 0245  WBC 8.5 11.9*  HGB 7.1* 10.0*  HCT 21.5* 29.7*  PLT 179 189   BMET  Recent Labs  04/28/15 0416 04/29/15 0245 04/30/15 0500  NA 137 138  --   K 3.0* 3.6  --   CL 104 99*  --   CO2 25 26  --   GLUCOSE 264* 148*  --   BUN 6 7  --   CREATININE 0.66 0.76 0.77  CALCIUM 7.8* 8.7*  --     COAG Lab Results  Component Value Date   INR 1.30 04/23/2015   INR 1.08 04/18/2015   INR 1.09 04/12/2015   No results found for: PTT   Addendum  I have independently interviewed and examined the patient, and I agree with the physician assistant's findings.  Pt continues to pass flatus.  Pt has known colon distension c/w Ogilive's syndrome.  If no response to suppository, and try enema.  If no response, may  need to get General Surgery involvement.  Adele Barthel, MD [facility] [place] Office: [phone] Pager: [phone]  04/30/2015, 10:36 AM

## 2015-04-30 NOTE — Progress Notes (Signed)
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 No s/p prolonged vascular procedures with multiple bypass and amputation of R 5th toe. PMH: COPD, HTN, schizophrenia, NIIDM.     PT Comments    Patient feeling much today after having a BM. Progressing well with mobility requiring only Min guard assist for safety. Pt still with some impaired safety awareness more related to being easily distracted. Able to safely get in/out of bed without assist. Encourage ambulation 2 more times today with RN to improve strength/mobility. Discharge plan updated to return to ALF with HHPT due to improvement. Will follow acutely to improve strength and safety during mobility prior to return home.    Follow Up Recommendations  Home health PT;Supervision for mobility/OOB     Equipment Recommendations  None recommended by PT    Recommendations for Other Services       Precautions / Restrictions Precautions Precautions: Fall Restrictions Weight Bearing Restrictions: Yes RLE Weight Bearing: Weight bearing as tolerated Other Position/Activity Restrictions: in flat bottom shoe    Mobility  Bed Mobility Overal bed mobility: Needs Assistance Bed Mobility: Rolling;Sidelying to Sit;Sit to Sidelying Rolling: Modified independent (Device/Increase time) Sidelying to sit: Modified independent (Device/Increase time)     Sit to sidelying: Modified independent (Device/Increase time) General bed mobility comments: HOB flat, no use of rails to simulate home environment. Good technique.  Transfers Overall transfer level: Needs assistance Equipment used: Rolling walker (2 wheeled) Transfers: Sit to/from Stand Sit to Stand: Min guard         General transfer comment: Min guard for safety. Stood from [employer], from chair x1. Good  technique.  Ambulation/Gait Ambulation/Gait assistance: Min guard Ambulation Distance (Feet): 175 Feet Assistive device: Rolling walker (2 wheeled) Gait Pattern/deviations: Trunk flexed;Step-through pattern;Decreased stride length;Shuffle Gait velocity: decreased Gait velocity interpretation: Below normal speed for age/gender General Gait Details: Pt with flexed trunk and RW too far forward to CoM. Cues for upright posture and RW proximity. Pt bumping into obstacles in hallway due to distractions and talking to people. 2/4 DOE.    Stairs            Wheelchair Mobility    Modified Rankin (Stroke Patients Only)       Balance Overall balance assessment: Needs assistance Sitting-balance support: Feet supported;No upper extremity supported Sitting balance-Leahy Scale: Good Sitting balance - Comments: Able to reach outside BoS and donn flat bottom shoe with Min A for straps. Increased time. No LOB.   Standing balance support: During functional activity Standing balance-Leahy Scale: Fair Standing balance comment: Able to manuever short distances within room with 1 UE support on furniture.                     Cognition Arousal/Alertness: Awake/alert Behavior During Therapy: WFL for tasks assessed/performed Overall Cognitive Status: Impaired/Different from baseline Area of Impairment: Safety/judgement         Safety/Judgement: Decreased awareness of safety;Decreased awareness of deficits          Exercises      General Comments        Pertinent Vitals/Pain Pain Assessment: Faces Faces Pain Scale: Hurts a little bit Pain Location: foot Pain Descriptors / Indicators: Sore Pain Intervention(s): Monitored during session;Repositioned    Home Living  Prior Function            PT Goals (current goals can now be found in the care plan section) Progress towards PT goals: Progressing toward goals    Frequency  Min  3X/week    PT Plan Discharge plan needs to be updated    Co-evaluation             End of Session Equipment Utilized During Treatment: Gait belt Activity Tolerance: Patient tolerated treatment well Patient left: in chair;with call bell/phone within reach;Other (comment) (with OT present in room for session.)     Time: 1145-1205 PT Time Calculation (min) (ACUTE ONLY): 20 min  Charges:  $Gait Training: 8-22 mins                    G Codes:      Everton Bertha A Natallia Stellmach 04/30/2015, 12:15 PM Mylo Red, PT, DPT [phone]

## 2015-04-30 NOTE — Progress Notes (Signed)
[facility] progress note-follow-up consult note   PATIENT DETAILS Name: Craig Bentley Age: 60 y.o. Sex: male Date of Birth: [birth year] Admit Date: 04/23/2015 Admitting Physician Conrad [place], MD JGG:[id number], SADAOU, NP  Subjective: Vomited this morning, had bowel movement this morning.  Assessment/Plan: Active Problems: Postoperative ileus: Continue supportive care, downgrade to clear liquids. Monitor potassium/magnesium am a repeat x-ray tomorrow.  Essential hypertension: Controlled, continue HCTZ-if potassium continues to be low-given ongoing ileus-may need to be changed to an alternative agent.  Type 2 diabetes: CBGs stable, discontinue metformin while inpatient. Continue SSI  History of schizophrenia: Appears to be stable-continue usual medications  Acute hypoxic respiratory failure: Occurred postoperatively, required ICU monitoring with ventilator support-seems to have resolved.  Anemia: Stable. Follow.  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  Disposition: Remain inpatient-suspect SNF on discharge  Antimicrobial agents  See below  Anti-infectives    Start     Dose/Rate Route Frequency Ordered Stop   04/24/15 0200  cefUROXime (ZINACEF) 1.5 g in dextrose 5 % 50 mL IVPB     1.5 g 100 mL/hr over 30 Minutes Intravenous Every 12 hours 04/23/15 2034 04/24/15 1453   04/23/15 1315  cefUROXime (ZINACEF) 1.5 g in dextrose 5 % 50 mL IVPB  Status:  Discontinued     1.5 g 100 mL/hr over 30 Minutes Intravenous To ShortStay [facility] 04/23/15 1311 04/23/15 2017   04/23/15 0730  cefUROXime (ZINACEF) 1.5 g in dextrose 5 % 50 mL IVPB     1.5 g 100 mL/hr over 30 Minutes Intravenous To ShortStay Surgical 04/22/15 2009 04/23/15 1734      DVT Prophylaxis: Prophylactic Lovenox   Code Status: Full code   Family  Communication None at bedside  Time spent:  20 minutes  MEDICATIONS: Scheduled Meds: . antiseptic oral rinse  7 mL Mouth Rinse q12n4p  . chlorhexidine  15 mL Mouth Rinse BID  . cloZAPine  100 mg Oral BID WC   And  . cloZAPine  400 mg Oral QHS  . docusate sodium  100 mg Oral Daily  . donepezil  5 mg Oral QHS  . enoxaparin (LOVENOX) injection  40 mg Subcutaneous Q24H  . gabapentin  300 mg Oral TID  . haloperidol  10 mg Oral QHS  . hydrochlorothiazide  25 mg Oral Daily  . insulin aspart  0-9 Units Subcutaneous TID WC  . ipratropium-albuterol  3 mL Nebulization TID  . lactulose  30 g Oral BID  . lisinopril  20 mg Oral Daily  . magnesium oxide  800 mg Oral Once  . magnesium sulfate 1 - 4 g bolus IVPB  2 g Intravenous Once  . metoprolol tartrate  25 mg Oral BID  . multivitamin with minerals  1 tablet Oral Daily  . pantoprazole  40 mg Oral Daily  . polyethylene glycol  17 g Oral BID  . senna  1 tablet Oral BID  . simvastatin  5 mg Oral QHS  . sodium phosphate  1 enema Rectal Once  . trihexyphenidyl  5 mg Oral BID WC   Continuous Infusions:  PRN Meds:.acetaminophen **OR** acetaminophen, albuterol, alum & mag hydroxide-simeth, bisacodyl, dicyclomine, guaifenesin, guaiFENesin-dextromethorphan, labetalol, LORazepam, morphine injection, ondansetron, oxyCODONE-acetaminophen, phenol, senna-docusate, sodium phosphate    PHYSICAL EXAM: Vital signs in last 24 hours: Filed Vitals:   04/29/15 1939 04/29/15 1956  04/30/15 0442 04/30/15 1026  BP:  133/65 157/69 124/60  Pulse:  108 95   Temp:  98.8 F (37.1 C) 98.8 F (37.1 C)   TempSrc:  Axillary Oral   Resp:  18 20   Height:      Weight:      SpO2: 89% 95% 91%     Weight change:  Filed Weights   04/23/15 0704 04/24/15 0500  Weight: 79.833 kg (176 lb) 83.2 kg (183 lb 6.8 oz)   Body mass index is 25.23 kg/(m^2).   Gen Exam: Awake and alert with clear speech.   Neck: Supple, No JVD.   Chest: B/L Clear.   CVS: S1 S2 Regular,  no murmurs.  Abdomen: soft, BS +, non tender,  but slightly distended. staples in place. Extremities: no edema, lower extremities warm to touch. Neurologic: Non Focal.   Skin: No Rash.  Wounds: N/A.    Intake/Output from previous day:  Intake/Output Summary (Last 24 hours) at 04/30/15 1201 Last data filed at 04/30/15 1135  Gross per 24 hour  Intake    480 ml  Output    900 ml  Net   -420 ml     LAB RESULTS: CBC  Recent Labs Lab 04/27/15 0409 04/27/15 1235 04/28/15 0416 04/29/15 0245 04/30/15 0814  WBC 11.8* 11.4* 8.5 11.9* 11.0*  HGB 7.8* 7.7* 7.1* 10.0* 10.5*  HCT 24.3* 23.7* 21.5* 29.7* 33.1*  PLT 166 169 179 189 270  MCV 80.7 80.3 82.1 83.2 84.9  MCH 25.9* 26.1 27.1 28.0 26.9  MCHC 32.1 32.5 33.0 33.7 31.7  RDW 13.6 13.7 13.9 14.0 14.5  LYMPHSABS  --  2.5  --   --   --   MONOABS  --  0.9  --   --   --   EOSABS  --  0.0  --   --   --   BASOSABS  --  0.0  --   --   --     Chemistries   Recent Labs Lab 04/24/15 0405 04/25/15 0426 04/26/15 0430 04/27/15 0409 04/28/15 0416 04/29/15 0245 04/30/15 0500 04/30/15 0814  NA 137 138 137 138 137 138  --  139  K 3.9 3.4* 3.1* 3.1* 3.0* 3.6  --  4.1  CL 105 103 102 103 104 99*  --  96*  CO2 '26 25 26 25 25 26  '$ --  32  GLUCOSE 159* 139* 164* 150* 264* 148*  --  153*  BUN '8 10 7 9 6 7  '$ --  15  CREATININE 0.75 0.72 0.59* 0.68 0.66 0.76 0.77 0.83  CALCIUM 8.0* 7.9* 8.3* 8.3* 7.8* 8.7*  --  9.1  MG 1.1* 1.5*  --   --  1.5* 1.6*  --  2.2    CBG:  Recent Labs Lab 04/29/15 1118 04/29/15 1651 04/29/15 2058 04/30/15 0556 04/30/15 1120  GLUCAP 118* 128* 138* 134* 126*    GFR Estimated Creatinine Clearance: 102.4 mL/min (by C-G formula based on Cr of 0.83).  Coagulation profile  Recent Labs Lab 04/23/15 2050  INR 1.30    Cardiac Enzymes  Recent Labs Lab 04/23/15 2050 04/24/15 0405 04/24/15 0859  TROPONINI <0.03 <0.03 <0.03    Invalid input(s): POCBNP No results for input(s): DDIMER in the last  72 hours.  Recent Labs  04/28/15 0850  HGBA1C 6.3*   No results for input(s): CHOL, HDL, LDLCALC, TRIG, CHOLHDL, LDLDIRECT in the last 72 hours. No results for input(s): TSH, T4TOTAL, T3FREE, THYROIDAB in  the last 72 hours.  Invalid input(s): FREET3 No results for input(s): VITAMINB12, FOLATE, FERRITIN, TIBC, IRON, RETICCTPCT in the last 72 hours. No results for input(s): LIPASE, AMYLASE in the last 72 hours.  Urine Studies No results for input(s): UHGB, CRYS in the last 72 hours.  Invalid input(s): UACOL, UAPR, USPG, UPH, UTP, UGL, UKET, UBIL, UNIT, UROB, ULEU, UEPI, UWBC, URBC, UBAC, CAST, UCOM, BILUA  MICROBIOLOGY: No results found for this or any previous visit (from the past 240 hour(s)).  RADIOLOGY STUDIES/RESULTS: Ct Angio Ao+bifem W/cm &/or Wo/cm  04/12/2015  CLINICAL DATA:  Gangrenous left foot. EXAM: CT ANGIOGRAPHY AOBIFEM WITHOUT AND WITH CONTRAST TECHNIQUE: Arterial phase CT images of the abdomen, pelvis and complete bilateral lower extremities were obtained, with multi plantar maximum intensity projection reformats generated. CONTRAST:  110m OMNIPAQUE IOHEXOL 350 MG/ML SOLN COMPARISON:  09/25/2012 CT abdomen/ pelvis. FINDINGS: Lower chest: There are multiple slightly irregular thin-walled cystic structures at both lung bases, which have slightly increased in size since 09/25/2012. Mild circumferential wall thickening in the lower thoracic esophagus. Hepatobiliary: Normal liver with no liver mass. Normal gallbladder with no radiopaque cholelithiasis. No biliary ductal dilatation. Pancreas: Normal, with no mass or duct dilation. Spleen: Normal size. No mass. Adrenals/Urinary Tract: There is stable irregular nodular thickening of both adrenal glands measuring up to 15 mm thickness in the left adrenal gland, in keeping with nodular adrenal hyperplasia versus multiple adenomas. No hydronephrosis. No contour deforming renal mass. Nonspecific diffuse bladder wall thickening.  Stomach/Bowel: Grossly normal stomach. Normal caliber small bowel with no small bowel wall thickening. Normal appendix. No large bowel wall thickening or pericolonic fat stranding. No bowel pneumatosis. Vascular/Lymphatic: Abdominal Aorta: Atherosclerotic nonaneurysmal abdominal aorta. Celiac Artery: Patent. SMA: Patent. IMA: High-grade atherosclerotic stenosis at the origin, approximately 70%. Renal Arteries: Two renal arteries bilaterally, all patent. Patent renal veins. No pathologically enlarged lymph nodes in the abdomen or pelvis. Reproductive: Stable top-normal size prostate with nonspecific internal prostatic calcification. Other: No pneumoperitoneum, ascites or focal fluid collection. Musculoskeletal: No aggressive appearing focal osseous lesions. Stable chronic mild L4 vertebral compression fracture. Mild-to-moderate degenerative changes in the visualized thoracolumbar spine. RIGHT Lower Extremity: Common Iliac Artery: Marked atherosclerosis, with no high-grade stenosis. External Iliac Artery: Occlusion just distal to the right common iliac artery bifurcation, new since 09/25/2012 Internal Iliac Artery: Marked atherosclerosis, with no high-grade stenosis. Common Femoral Artery: Reconstituted proximally by a right abdominal muscle wall collaterals, no high-grade stenosis. Superficial Femoral Artery: Occluded proximally just beyond the right common femoral artery bifurcation, with distal reconstitution via profunda collaterals. Deep Femoral Artery: Atherosclerotic and patent, with no high-grade stenosis. Popliteal Artery: Occluded distally. Tibioperonal Trunk: Reconstituted proximally by periarticular collaterals, atherosclerotic with no high-grade stenosis. Anterior Tibial Artery: Reconstituted proximal and by periarticular collaterals, atherosclerotic without appreciable high-grade stenosis. Posterior Tibial Artery: Atherosclerotic and patent with runoff to the posterior foot arterial arcade. Peroneal  Artery: Occluded distally just above the ankle. Dorsalis Pedis Artery: Patent with runoff to the anterior foot arterial arcade. LEFT Lower Extremity: Common Iliac Artery: Marked atherosclerosis, with no high-grade stenosis. External Iliac Artery: Proximal occlusion just distal to the left common iliac artery bifurcation, which is chronic and unchanged since 09/25/2012. Internal Iliac Artery: Marked atherosclerosis, with no high-grade stenosis. Common Femoral Artery: Occluded. Superficial Femoral Artery: Reconstituted proximally by collaterals, atherosclerotic without high-grade stenosis. Deep Femoral Artery: Patent. Popliteal Artery: Atherosclerotic and patent, with no high-grade stenosis. Tibioperonal Trunk: Atherosclerotic and patent with no high-grade stenosis. Anterior Tibial Artery: Atherosclerotic and patent with no high-grade  stenosis. Posterior Tibial Artery: Atherosclerotic and patent with runoff to the posterior left foot arterial arcade. Peroneal Artery: Occluded proximally. Dorsalis Pedis Artery: Patent with runoff to the anterior foot arterial arcade. Review of the MIP images confirms the above findings. IMPRESSION: 1. 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 Two-vessel runoff to the left foot, fed predominantly by left internal iliac and left abdominopelvic muscle wall collaterals. 2. Right external iliac artery occlusion, new since 09/25/2012. Proximal right superficial femoral artery occlusion. Distal right popliteal artery occlusion. Two-vessel runoff to the right foot, fed predominantly by profunda collaterals. 3. No acute abnormality in the abdomen or pelvis. Nonspecific diffuse bladder wall thickening, probably chronic, correlate with urinalysis to exclude acute urinary tract infection. 4. Mild progression of thin-walled cysts at both lung bases, which could indicate a nonspecific cystic interstitial lung disease. Correlate with high-resolution chest CT on a short  term outpatient basis as clinically warranted. Electronically Signed   By: Ilona Sorrel M.D.   On: 04/12/2015 16:45   Mr Foot Right W Wo Contrast  04/02/2015  CLINICAL DATA:  Diabetic foot infection.  Fifth digit is black. EXAM: MRI OF THE RIGHT FOREFOOT WITHOUT AND WITH CONTRAST TECHNIQUE: Multiplanar, multisequence MR imaging was performed both before and after administration of intravenous contrast. CONTRAST:  78m MULTIHANCE GADOBENATE DIMEGLUMINE 529 MG/ML IV SOLN COMPARISON:  None. FINDINGS: Minimal marrow edema in the first distal phalanx without cortical destruction. No other marrow signal abnormality. No acute fracture dislocation. No bone destruction or periosteal reaction. Mild osteoarthritis of the first MTP joint. There is no soft tissue fluid collection or hematoma. There is mild generalized soft tissue edema along the dorsal aspect of the midfoot with mild enhancement which may reflect reactive edema versus mild cellulitis. The muscles are normal. The visualized flexor and extensor tendons are intact. IMPRESSION: 1. No evidence of osteomyelitis of the right foot. Electronically Signed   By: HKathreen Devoid  On: 04/02/2015 18:44   Nm Myocar Multi W/spect W/wall Motion / Ef  04/12/2015  CLINICAL DATA:  60year old male with right foot gangrene. Cardiac clearance prior to surgery. EXAM: MYOCARDIAL IMAGING WITH SPECT (REST AND PHARMACOLOGIC-STRESS) GATED LEFT VENTRICULAR WALL MOTION STUDY LEFT VENTRICULAR EJECTION FRACTION TECHNIQUE: Standard myocardial SPECT imaging was performed after resting intravenous injection of 10 mCi [id number]. Subsequently, intravenous infusion of Lexiscan was performed under the supervision of the Cardiology staff. At peak effect of the drug, 30 mCi [id number] was injected intravenously and standard myocardial SPECT imaging was performed. Quantitative gated imaging was also performed to evaluate left ventricular wall motion, and estimate left ventricular  ejection fraction. COMPARISON:  CT scan of the chest 02/20/2015 FINDINGS: Perfusion: No decreased activity in the left ventricle on stress imaging to suggest reversible ischemia or infarction. Wall Motion: Normal left ventricular wall motion. No left ventricular dilation. Left Ventricular Ejection Fraction: 56 % End diastolic volume 92 ml End systolic volume 40 ml IMPRESSION: 1. No reversible ischemia or infarction. 2. Normal left ventricular wall motion. 3. Left ventricular ejection fraction 56% 4. Low-risk stress test findings*. *2012 Appropriate Use Criteria for Coronary Revascularization Focused Update: J Am Coll Cardiol. 2011829;93(7):[email or web address] Electronically Signed   By: HeaJacqulynn CadetD.   On: 04/12/2015 13:08   Dg Chest Port 1 View  04/24/2015  CLINICAL DATA:  Postoperative respiratory failure following progressed peripheral vascular procedure EXAM: PORTABLE CHEST 1 VIEW COMPARISON:  Portable chest x-ray of April 23, 2015 FINDINGS: The lungs are mildly hypoinflated. There  are infiltrates at both bases. The cardiac silhouette is top-normal in size. The pulmonary vascularity is less engorged and more distinct today. The endotracheal tube tip lies 4.2 cm above the carina. The esophagogastric tube tip projects below the inferior margin of the image. The Swan-Ganz catheter tip projects in the proximal main pulmonary artery outflow tract. IMPRESSION: Slight interval improvement in bibasilar atelectasis or pneumonia. Decreased pulmonary vascular congestion. The support tubes are in reasonable position. Electronically Signed   By: David  Martinique M.D.   On: 04/24/2015 07:48   Dg Chest Port 1 View  04/23/2015  CLINICAL DATA:  Postop patient.  Status post aortic bypass. EXAM: PORTABLE CHEST 1 VIEW COMPARISON:  02/19/2015 FINDINGS: Endotracheal tube tip projects 3.2 cm above the carina. Orogastric tube passes below the diaphragm and below the included field  of view. Right internal jugular Swan-Ganz catheter tip projects in the main pulmonary artery. There is hazy perihilar and lung base airspace opacity. This is likely atelectasis. No pneumothorax. Cardiac silhouette is normal in size. No mediastinal or hilar masses. IMPRESSION: 1. Support apparatus is well positioned as described. 2. Perihilar and lung base hazy airspace opacity. This is most likely atelectasis. Electronically Signed   By: Lajean Manes M.D.   On: 04/23/2015 21:44   Dg Abd Portable 1v  04/23/2015  CLINICAL DATA:  60 year old male with history of aortic bypass procedure. EXAM: PORTABLE ABDOMEN - 1 VIEW COMPARISON:  Abdominal radiograph 09/16/2013. FINDINGS: Gas and stool are noted throughout the colon extending to the level of the distal rectum. No pathologic dilatation of small bowel. No overt pneumoperitoneum confidently identified on today's single portable supine view. Midline skin staples. Nasogastric screw projecting over the stomach. IMPRESSION: 1. Nonobstructive bowel gas pattern. 2. No pneumoperitoneum. 3. Postoperative changes and support apparatus, as above. Electronically Signed   By: Vinnie Langton M.D.   On: 04/23/2015 21:43   Dg Abd Portable 2v  04/30/2015  CLINICAL DATA:  Vomiting.  Evaluate for small bowel obstruction. EXAM: PORTABLE ABDOMEN - 2 VIEW COMPARISON:  04/23/2015 . FINDINGS: Surgical staples noted abdomen. Dilated loops of small and large bowel with scattered air-fluid level noted suggesting adynamic ileus. Moderate stool burden. No free air. IMPRESSION: 1. Dilated loops of small and large bowel with scattered air-fluid levels suggesting adynamic ileus. No free air. Follow-up exam can be obtained to exclude developing obstruction. 2. Moderate stool burden. Electronically Signed   By: Marcello Moores  Register   On: 04/30/2015 10:05   Dg Foot Complete Right  04/02/2015  CLINICAL DATA:  Right pinky toe is black, with dorsal soft tissue swelling and erythema along the foot.  Initial encounter. EXAM: RIGHT FOOT COMPLETE - 3+ VIEW COMPARISON:  None. FINDINGS: There is no evidence of fracture or dislocation. No definite osseous erosion is characterized, though evaluation for osteomyelitis is limited on radiograph. The joint spaces are preserved. There is no evidence of talar subluxation; the subtalar joint is unremarkable in appearance. An os peroneum is seen. Mild dorsal soft tissue swelling is noted. IMPRESSION: 1. No evidence of fracture or dislocation. No definite osseous erosion seen, though evaluation for osteomyelitis is limited on radiograph. 2. Os peroneum seen. Electronically Signed   By: Garald Balding M.D.   On: 04/02/2015 06:47    Oren Binet, MD  [facility] Pager:[phone]  If 7PM-7AM, please contact night-coverage [email or web address] Password [name] 04/30/2015, 12:01 PM   LOS: 7 days

## 2015-04-30 NOTE — Progress Notes (Signed)
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 No s/p prolonged vascular procedures with multiple bypass and amputation of R 5th toe. PMH: COPD, HTN, schizophrenia, NIIDM.    OT comments  Pt alert and oriented to situation today. Pleased that he had a BM today and reports feeling much better. Pt requiring min guard assist for mobility with RW. Able to bathe and dress feet with set up and supervision. Updated discharge plan to return to ALF with [facility] services. Will continue to follow.  Follow Up Recommendations  Home health OT;Supervision/Assistance - 24 hour (at ALF)    Equipment Recommendations       Recommendations for Other Services      Precautions / Restrictions Precautions Precautions: Fall Restrictions Weight Bearing Restrictions: Yes RLE Weight Bearing: Weight bearing as tolerated Other Position/Activity Restrictions: in post op shoe       Mobility Bed Mobility Pt in chair, returned to chair.  Transfers Overall transfer level: Needs assistance Equipment used: Rolling walker (2 wheeled) Transfers: Sit to/from Stand Sit to Stand: Min guard         General transfer comment: cues for hand placement    Balance Overall balance assessment: Needs assistance Sitting-balance support: Feet supported;No upper extremity supported Sitting balance-Leahy Scale: Good Sitting balance - Comments: Able to reach outside BoS and donn flat bottom shoe with Min A for straps. Increased time. No LOB.   Standing balance support: During functional activity Standing balance-Leahy Scale: Fair Standing balance comment: Able to manuever short distances within room with 1 UE support on furniture.                    ADL Overall ADL's : Needs assistance/impaired     Grooming: Standing;Wash/dry hands;Min guard        Lower Body Bathing: Supervison/ safety;Sitting/lateral leans       Lower Body Dressing: Supervision/safety;Sitting/lateral leans Lower Body Dressing Details (indicate cue type and reason): instructed pt to leave straps through rings when donning and doffing post op shoe Toilet Transfer: Min guard;RW;Ambulation   Toileting- Clothing Manipulation and Hygiene: Sit to/from stand;Min guard       Functional mobility during ADLs: Min guard;Rolling walker General ADL Comments: No need for AE.      Vision                     Perception     Praxis      Cognition   Behavior During Therapy: WFL for tasks assessed/performed Overall Cognitive Status: Impaired/Different from baseline Area of Impairment: Safety/judgement          Safety/Judgement: Decreased awareness of safety;Decreased awareness of deficits     General Comments: oriented to situation today, pt sharing with staff that he had a BM and how happy he was about this    Extremity/Trunk Assessment               Exercises     Shoulder Instructions       General Comments      Pertinent Vitals/ Pain       Pain Assessment: Faces Faces Pain Scale: Hurts a little bit Pain Location: R foot Pain Descriptors / Indicators: Sore Pain Intervention(s): Monitored during session  Home Living  Prior Functioning/Environment              Frequency Min 2X/week     Progress Toward Goals  OT Goals(current goals can now be found in the care plan section)  Progress towards OT goals: Progressing toward goals  Acute Rehab OT Goals Patient Stated Goal: go home  Plan Discharge plan needs to be updated    Co-evaluation                 End of Session Equipment Utilized During Treatment: Gait belt;Rolling walker   Activity Tolerance Patient tolerated treatment well   Patient Left in chair;with call bell/phone within reach;with chair  alarm set   Nurse Communication          Time: 0165-5374 OT Time Calculation (min): 15 min  Charges: OT General Charges $OT Visit: 1 Procedure OT Treatments $Self Care/Home Management : 8-22 mins  [name] 04/30/2015, 1:08 PM [phone]

## 2015-05-01 ENCOUNTER — Inpatient Hospital Stay (HOSPITAL_COMMUNITY): Payer: Medicaid Other

## 2015-05-01 DIAGNOSIS — I7409 Other arterial embolism and thrombosis of abdominal aorta: Secondary | ICD-10-CM

## 2015-05-01 DIAGNOSIS — K913 Postprocedural intestinal obstruction: Secondary | ICD-10-CM

## 2015-05-01 LAB — TYPE AND SCREEN
ABO/RH(D): A POS
Antibody Screen: NEGATIVE
Unit division: 0
Unit division: 0
Unit division: 0
Unit division: 0

## 2015-05-01 LAB — BASIC METABOLIC PANEL
Anion gap: 8 (ref 5–15)
BUN: 12 mg/dL (ref 6–20)
CHLORIDE: 96 mmol/L — AB (ref 101–111)
CO2: 33 mmol/L — ABNORMAL HIGH (ref 22–32)
CREATININE: 0.78 mg/dL (ref 0.61–1.24)
Calcium: 9.2 mg/dL (ref 8.9–10.3)
GFR calc Af Amer: >60 mL/min (ref >60)
GFR calc non Af Amer: >60 mL/min (ref >60)
GLUCOSE: 155 mg/dL — AB (ref 65–99)
Potassium: 4 mmol/L (ref 3.5–5.1)
SODIUM: 137 mmol/L (ref 135–145)

## 2015-05-01 LAB — GLUCOSE, CAPILLARY
GLUCOSE-CAPILLARY: 126 mg/dL — AB (ref 65–99)
Glucose-Capillary: 115 mg/dL — ABNORMAL HIGH (ref 65–99)
Glucose-Capillary: 121 mg/dL — ABNORMAL HIGH (ref 65–99)

## 2015-05-01 LAB — MAGNESIUM: MAGNESIUM: 1.9 mg/dL (ref 1.7–2.4)

## 2015-05-01 MED ORDER — ASPIRIN EC 81 MG PO TBEC: 81.0000 mg | DELAYED_RELEASE_TABLET | Freq: Every day | ORAL | Status: DC

## 2015-05-01 MED ORDER — OXYCODONE-ACETAMINOPHEN 5-325 MG PO TABS: 1.0000 | ORAL_TABLET | ORAL | Status: DC | PRN

## 2015-05-01 MED ORDER — SENNOSIDES-DOCUSATE SODIUM 8.6-50 MG PO TABS: 1.0000 | ORAL_TABLET | Freq: Every evening | ORAL | Status: DC | PRN

## 2015-05-01 NOTE — Progress Notes (Signed)
Faxed DC summary and FL2 to [facility]. [facility] now refusing to admit patient because their admin person is gone. Nurse gave admin CSW's contact info. Plan to transfer patient in a.m. by PTAR.  Percell Locus Lex Linhares LCSWA [phone]

## 2015-05-01 NOTE — Progress Notes (Signed)
Patient will DC to: [facility] Anticipated DC date: 05/01/15 Family notified: Patient oriented Transport by: Corey Harold  Per [employer], [facility] can accept patient back today.  CSW signing off.  Cedric Fishman, [place] Social Worker [phone]

## 2015-05-01 NOTE — Discharge Summary (Signed)
[facility]  Discharge Summary  Craig Bentley 1955/05/19 60 y.o. male  [id number]  Admission Date: 04/23/2015  Discharge Date: 05/01/2015  Physician: Conrad [place], MD  Admission Diagnosis: Peripheral vascular disease with right foot ulcer I70.234  HPI:   This is a 60 y.o. male with schizophrenia and diabetes who presented with chief complaint: right foot pain. Onset of symptom occurred in November 2017. Pain is described as aching, severity 1-5/10, and associated with movement, rest and manipulating the right foot. Patient has attempted to treat this pain with rest. The patient has no rest pain symptoms. Wounds include: right 5th toe blackness and painful area in R 4th toe. Atherosclerotic risk factors include: HTN, DM, active smoker.  He will underwent an aortogram with bilateral runoff on 04/07/2015 which revealed:  1.There are 2 renal arteries on the left and one renal artery and the right. The infrarenal aorta is widely patent. The common iliac arteries and hypogastric arteries are patent. He has bilateral external iliac artery occlusions. 2. On the right side, which is the site of concern, the distal common femoral artery and deep femoral artery are patent. The superficial femoral artery is occluded at its origin. There is reconstitution of the above-knee popliteal artery but the knee popliteal artery occludes at the knee. There is then reconstitution of the anterior tibial and posterior tibial arteries. The below-knee popliteal artery is occluded and the peroneal artery is occluded. 3. On the left side there is reconstitution of the proximal superficial femoral artery and deep femoral artery. The external iliac artery and common femoral artery are occluded. The superficial femoral artery, popliteal, anterior tibial, and posterior tibial arteries are patent. The peroneal artery and the left is occluded.  He underwent preoperative cardiac workup including a  Lexiscan Myoview which was normal. He had normal LV systolic function by echo. He was cleared for surgery.  Hospital Course:  The patient was admitted to the hospital and taken to the operating room on 04/23/2015 and underwent: 1. Aortobifemoral bypass (14 x 8 mm) 2. Construction of composite graft 3. Right common femoral artery to posterior tibial artery with composite graft 4. Left common femoral artery to superficial femoral artery bypass with reversed ipsilateral greater saphenous vein  5. Right fifth toe amputation  The patient tolerated the procedure well and was transported to the surgical ICU intubated in stable condition.   POD 1: He was extubated. The patient's incisions were clean and intact without active bleeding. He had right dopplerable signals which were multiphasic. His left Doppler signals were greatly improved. The dynamic stable.  POD 2: He was agitated and removed his NGT. He was kept on Precedex. He had acceptable urine output. He had hypoactive bowel sounds and was kept nothing by mouth. He was mildly tachycardic.   POD 3: He was not passing flatus. He was kept nothing by mouth. His Precedex was weaned.  POD 4: His incisions were healing well. He had Doppler flow to his feet bilaterally. He was confused. He remained tachycardic. He continued on Precedex for agitation. His breathing status was stable on room air.  POD 5: The patient was passing flatus. He was started on a clear liquid diet. He had faintly palpable left posterior tibial pulse. And easily palpable right posterior tibial pulse. He had acute blood loss anemia with hemoglobin of 7.1 and he was transfused 2 units of PRBCs. He was also diuresed for fluid overload. He was started on a bowel regimen given significant colonic distention.  He was transferred to 2 W.  POD 6: His hemoglobin had improved after 2 units of PRBCs. He was continued on a bowel regimen.  POD 7: Patient continued to pass flatus but no bowel  movement. He was given a suppository. His diet was advanced.  POD 8: His and site was healing. His functional status has improved. He had 2 bowel movements. He had palpable PT pulses bilaterally. His groin incisions were healing well. The staples in his left leg were intact. His abdomen was soft and distention improved. His abdominal staples were intact. His diet was advanced to a regular diet. He was stable for discharge to his assisted living facility. He was discharged home on postop day 8 in good condition. He was discharged home with Senokot daily when necessary at bedtime for constipation.  CBC    Component Value Date/Time   WBC 11.0* 04/30/2015 0814   RBC 3.90* 04/30/2015 0814   RBC 3.92* 04/03/2015 0356   HGB 10.5* 04/30/2015 0814   HCT 33.1* 04/30/2015 0814   PLT 270 04/30/2015 0814   MCV 84.9 04/30/2015 0814   MCH 26.9 04/30/2015 0814   MCHC 31.7 04/30/2015 0814   RDW 14.5 04/30/2015 0814   LYMPHSABS 2.5 04/27/2015 1235   MONOABS 0.9 04/27/2015 1235   EOSABS 0.0 04/27/2015 1235   BASOSABS 0.0 04/27/2015 1235    BMET    Component Value Date/Time   NA 137 05/01/2015 0800   K 4.0 05/01/2015 0800   CL 96* 05/01/2015 0800   CO2 33* 05/01/2015 0800   GLUCOSE 155* 05/01/2015 0800   BUN 12 05/01/2015 0800   CREATININE 0.78 05/01/2015 0800   CALCIUM 9.2 05/01/2015 0800   GFRNONAA >60 05/01/2015 0800   GFRAA >60 05/01/2015 0800     Discharge Instructions:   The patient is discharged to assisted living facility with extensive instructions on wound care and progressive ambulation. They are instructed not to drive or perform any heavy lifting until returning to see the physician in his office.      Discharge Instructions    Call MD for:  redness, tenderness, or signs of infection (pain, swelling, bleeding, redness, odor or green/yellow discharge around incision site)    Complete by:  As directed      Call MD for:  severe or increased pain, loss or decreased feeling  in  affected limb(s)    Complete by:  As directed      Call MD for:  temperature >100.5    Complete by:  As directed      Discharge wound care:    Complete by:  As directed   Wash all wounds daily with soap and water and pat dry.   Wash the groin wounds with soap and water daily and pat dry. (No tub bath-only shower)  Then put a dry gauze or washcloth there to keep this area dry daily and as needed.  Do not use Vaseline or neosporin on your incisions.  Only use soap and water on your incisions and then protect and keep dry.     Driving Restrictions    Complete by:  As directed   No driving for 2 weeks     Increase activity slowly    Complete by:  As directed   Walk with assistance use walker or cane as needed     Lifting restrictions    Complete by:  As directed   No lifting greater than 5 lbs for 3 weeks  Resume previous diet    Complete by:  As directed            Discharge Diagnosis:  Peripheral vascular disease with right foot ulcer I70.234  Secondary Diagnosis: Patient Active Problem List   Diagnosis Date Noted  . Post-operative state   . Respiratory failure ([place])   . Surgery, elective   . Anxiety   . Schizophrenia ([place])   . Atherosclerosis of extremity with gangrene ([place])   . Hyperglycemia   . Preoperative cardiovascular examination 04/11/2015  . PVD (peripheral vascular disease) ([place]) 04/10/2015  . Absolute anemia   . Diabetic foot infection ([place]) 04/02/2015  . Right foot pain 04/02/2015  . Neutrophilic leukocytosis 56/81/2751  . Esophageal abnormality   . HCAP (healthcare-associated pneumonia)   . COPD exacerbation ([place])   . Dyspnea   . Respiratory distress   . Sepsis due to pneumonia ([place]) 02/14/2015  . Acute respiratory failure with hypoxia ([place]) 02/13/2015  . Diabetes mellitus with neurologic complication, without long-term current use of insulin ([place]) 07/20/2011  . Essential hypertension   . COPD (chronic obstructive pulmonary disease) (HCC)    Past  Medical History  Diagnosis Date  . Hypertension   . Diabetes mellitus   . Schizophrenia, schizo-affective ([place])   . COPD (chronic obstructive pulmonary disease) ([place])   . Constipation   . Fecal impaction ([place])   . Pneumonia   . Anxiety        Medication List    TAKE these medications        acetaminophen 325 MG tablet  Commonly known as:  TYLENOL  Take 650 mg by mouth every 6 (six) hours as needed for mild pain, fever or headache.     albuterol 108 (90 Base) MCG/ACT inhaler  Commonly known as:  PROVENTIL HFA;VENTOLIN HFA  Inhale 2 puffs into the lungs every 4 (four) hours as needed for wheezing or shortness of breath.     albuterol (2.5 MG/3ML) 0.083% nebulizer solution  Commonly known as:  PROVENTIL  Take 3 mLs (2.5 mg total) by nebulization every 4 (four) hours as needed for wheezing.     aspirin EC 81 MG tablet  Take 1 tablet (81 mg total) by mouth daily.     cloZAPine 100 MG tablet  Commonly known as:  CLOZARIL  Take 100-400 mg by mouth 3 (three) times daily. Takes 1 tablet every morning and afternoon Takes 4 tablets at bedtime     dicyclomine 20 MG tablet  Commonly known as:  BENTYL  Take 20 mg by mouth 2 (two) times daily as needed for spasms.     donepezil 5 MG tablet  Commonly known as:  ARICEPT  Take 5 mg by mouth at bedtime.     gabapentin 300 MG capsule  Commonly known as:  NEURONTIN  Take 300 mg by mouth 3 (three) times daily.     guaifenesin 100 MG/5ML syrup  Commonly known as:  ROBITUSSIN  Take 200 mg by mouth 4 (four) times daily as needed for cough.     haloperidol 10 MG tablet  Commonly known as:  HALDOL  Take 10 mg by mouth at bedtime.     hydrochlorothiazide 25 MG tablet  Commonly known as:  HYDRODIURIL  Take 25 mg by mouth daily.     ipratropium-albuterol 0.5-2.5 (3) MG/3ML Soln  Commonly known as:  DUONEB  Take 3 mLs by nebulization 3 (three) times daily.     lactulose 10 GM/15ML solution  Commonly known as:  CHRONULAC  Take 30 g  by mouth 2 (two) times daily.     lisinopril 20 MG tablet  Commonly known as:  PRINIVIL,ZESTRIL  Take 20 mg by mouth daily.     metFORMIN 500 MG tablet  Commonly known as:  GLUCOPHAGE  Take 500 mg by mouth 2 (two) times daily with a meal.     metoprolol tartrate 25 MG tablet  Commonly known as:  LOPRESSOR  Take 25 mg by mouth 2 (two) times daily.     multivitamin with minerals Tabs tablet  Take 1 tablet by mouth daily.     oxyCODONE-acetaminophen 5-325 MG tablet  Commonly known as:  PERCOCET/ROXICET  Take 1 tablet by mouth every 4 (four) hours as needed for moderate pain.     pantoprazole 40 MG tablet  Commonly known as:  PROTONIX  Take 1 tablet (40 mg total) by mouth daily.     polyethylene glycol packet  Commonly known as:  MIRALAX / GLYCOLAX  Take 17 g by mouth daily as needed for mild constipation or moderate constipation.     senna 8.6 MG Tabs tablet  Commonly known as:  SENOKOT  Take 1 tablet by mouth 2 (two) times daily.     senna-docusate 8.6-50 MG tablet  Commonly known as:  Senokot-S  Take 1 tablet by mouth at bedtime as needed for mild constipation.     simvastatin 5 MG tablet  Commonly known as:  ZOCOR  Take 5 mg by mouth at bedtime.     trihexyphenidyl 5 MG tablet  Commonly known as:  ARTANE  Take 5 mg by mouth 2 (two) times daily with a meal.        Percocet #30 No Refill  Disposition: Assisted living facilityGood  Patient's condition: is Good  Follow up: 1. Dr. Bridgett Larsson in 2 weeks   Virgina Jock, PA-C [facility] [phone] 05/01/2015  3:45 PM  Addendum  This patient was seen during a prior admission for a R 5th toe ulcer and gangrene with adjacent 4th toe ulcer.  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   Unfortunately, due to his schizophrenia, it was difficult to get this patient to understand the need for surgery.  During a lucid period, he finally agreed to proceed  with surgery.  He came back for an elective aortobifemoral bypass (end-to-side configuration) and right common femoral artery to posterior tibial artery bypass, and right 5th toe amputation..  The aortobifemoral bypass was uneventful.  Intra-operatively, the left greater saphenous vein (better of the two) was found to be inadequate and disease, so I had to complete the right common femoral artery to posterior tibial artery bypass with a composite vein/Propaten conduit.  I found that the proximal left superficial femoral artery was chronically occluded, so I did a limited endarterectomy and jumped from the left common femoral artery to superficial femoral artery with non-reversed segment of the left greater saphenous vein.  This patient recovery has been uneventful as documented above.  The main delaying issue has been this patient's prior existing colonic dysmotility.  Intraoperatively he had extremely dilated transverse colon and sigmoid colon, consistent with a Ogilive's syndrome.  However, with bowel regimen and suppository use, he was able to have two bowel movement.  He was able to tolerate his diabetic diet.  OT/PT felt he had improved enough to do outpatient OT/PT.  He is being discharge to his group home with outpatient PT/OT.  He  will follow up in the office in 2 weeks for staple removal from the abdomen and left leg.   Adele Barthel, MD [facility] [place] Office: [phone] Pager: [phone]  05/01/2015, 4:42 PM   - For VQI Registry use ---   Post-op:  Time to Extubation: '[ ]'$  In OR, '[ ]'$  < 12 hrs, [x ] 12-24 hrs, '[ ]'$  >=24 hrs Vasopressors Req. Post-op: Yes ICU Stay: 5 days Transfusion: Yes  If yes, 2 units given MI: No, '[ ]'$  Troponin only, '[ ]'$  EKG or Clinical New Arrhythmia: No  Complications: CHF: No Resp failure: No, '[ ]'$  Pneumonia, '[ ]'$  Ventilator Chg in renal function: No, '[ ]'$  Inc. Cr > 0.5, '[ ]'$  Temp. Dialysis, '[ ]'$  Permanent dialysis Leg ischemia: No, no  Surgery needed, '[ ]'$  Yes, Surgery needed, '[ ]'$  Amputation Bowel ischemia: No, '[ ]'$  Medical Rx, '[ ]'$  Surgical Rx Wound complication: No, '[ ]'$  Superficial separation/infection, '[ ]'$  Return to OR Return to OR: No  Return to OR for bleeding: No Stroke: No, '[ ]'$  Minor, '[ ]'$  Major  Discharge medications: Statin use:  Yes If No: '[ ]'$  For Medical reasons, '[ ]'$  Non-compliant ASA use:  Yes  If No: '[ ]'$  For Medical reasons, '[ ]'$  Non-compliant Plavix use:  No If No: '[ ]'$  For Medical reasons, '[ ]'$  Non-compliant Beta blocker use:  Yes If No: '[ ]'$  For Medical reasons, '[ ]'$  Non-compliant

## 2015-05-01 NOTE — NC FL2 (Signed)
[place] LEVEL OF CARE SCREENING TOOL     IDENTIFICATION  Patient Name: Craig Bentley Birthdate: 1955-05-01 Sex: male Admission Date (Current Location): 04/23/2015  [facility] and [place] Number:  Herbalist and Address:  The . [facility] [place] [address] [place], [place] [address]      Provider Number: [id number]  Attending Physician Name and Address:  Conrad [place], MD  Relative Name and Phone Number:       Current Level of Care: Hospital Recommended Level of Care: [place] Prior Approval Number:    Date Approved/Denied:   PASRR Number:    Discharge Plan: Other (Comment) (ALF)    Current Diagnoses: Patient Active Problem List   Diagnosis Date Noted  . Post-operative state   . Respiratory failure ([place])   . Surgery, elective   . Anxiety   . Schizophrenia ([place])   . Atherosclerosis of extremity with gangrene ([place])   . Hyperglycemia   . Preoperative cardiovascular examination 04/11/2015  . PVD (peripheral vascular disease) ([place]) 04/10/2015  . Absolute anemia   . Diabetic foot infection ([place]) 04/02/2015  . Right foot pain 04/02/2015  . Neutrophilic leukocytosis 18/84/1660  . Esophageal abnormality   . HCAP (healthcare-associated pneumonia)   . COPD exacerbation ([place])   . Dyspnea   . Respiratory distress   . Sepsis due to pneumonia ([place]) 02/14/2015  . Acute respiratory failure with hypoxia ([place]) 02/13/2015  . Diabetes mellitus with neurologic complication, without long-term current use of insulin ([place]) 07/20/2011  . Essential hypertension   . COPD (chronic obstructive pulmonary disease) (HCC)     Orientation RESPIRATION BLADDER Height & Weight     Self, Time, Situation, Place  Normal Continent Weight: 83.2 kg (183 lb 6.8 oz) Height:  5' 11.5" (181.6 cm)  BEHAVIORAL SYMPTOMS/MOOD NEUROLOGICAL BOWEL NUTRITION STATUS     (N/A) Incontinent Diet (Regular)  AMBULATORY STATUS COMMUNICATION OF NEEDS Skin    Limited Assist Verbally Surgical wounds                       Personal Care Assistance Level of Assistance  Bathing, Feeding, Dressing Bathing Assistance: Limited assistance Feeding assistance: Independent Dressing Assistance: Independent     Functional Limitations Info  Sight, Hearing, Speech Sight Info: Adequate Hearing Info: Adequate Speech Info: Adequate    SPECIAL CARE FACTORS FREQUENCY  PT (By licensed PT), OT (By licensed OT)     PT Frequency: 3x/week OT Frequency: 2x/week            Contractures Contractures Info: Not present    Additional Factors Info  Code Status, Allergies, Psychotropic, Insulin Sliding Scale Code Status Info: Full Allergies Info: NKA Psychotropic Info: Haldol Insulin Sliding Scale Info: insulin aspart (novoLOG) injection 0-9 Units 3/day       Current Medications (05/01/2015):    Discharge Medications: TAKE these medications       acetaminophen 325 MG tablet  Commonly known as: TYLENOL  Take 650 mg by mouth every 6 (six) hours as needed for mild pain, fever or headache.     albuterol 108 (90 Base) MCG/ACT inhaler  Commonly known as: PROVENTIL HFA;VENTOLIN HFA  Inhale 2 puffs into the lungs every 4 (four) hours as needed for wheezing or shortness of breath.     albuterol (2.5 MG/3ML) 0.083% nebulizer solution  Commonly known as: PROVENTIL  Take 3 mLs (2.5 mg total) by nebulization every 4 (four) hours as needed for wheezing.  aspirin EC 81 MG tablet  Take 1 tablet (81 mg total) by mouth daily.     cloZAPine 100 MG tablet  Commonly known as: CLOZARIL  Take 100-400 mg by mouth 3 (three) times daily. Takes 1 tablet every morning and afternoon Takes 4 tablets at bedtime     dicyclomine 20 MG tablet  Commonly known as: BENTYL  Take 20 mg by mouth 2 (two) times daily as needed for spasms.     donepezil 5 MG tablet  Commonly known as: ARICEPT  Take 5 mg by mouth at bedtime.      gabapentin 300 MG capsule  Commonly known as: NEURONTIN  Take 300 mg by mouth 3 (three) times daily.     guaifenesin 100 MG/5ML syrup  Commonly known as: ROBITUSSIN  Take 200 mg by mouth 4 (four) times daily as needed for cough.     haloperidol 10 MG tablet  Commonly known as: HALDOL  Take 10 mg by mouth at bedtime.     hydrochlorothiazide 25 MG tablet  Commonly known as: HYDRODIURIL  Take 25 mg by mouth daily.     ipratropium-albuterol 0.5-2.5 (3) MG/3ML Soln  Commonly known as: DUONEB  Take 3 mLs by nebulization 3 (three) times daily.     lactulose 10 GM/15ML solution  Commonly known as: CHRONULAC  Take 30 g by mouth 2 (two) times daily.     lisinopril 20 MG tablet  Commonly known as: PRINIVIL,ZESTRIL  Take 20 mg by mouth daily.     metFORMIN 500 MG tablet  Commonly known as: GLUCOPHAGE  Take 500 mg by mouth 2 (two) times daily with a meal.     metoprolol tartrate 25 MG tablet  Commonly known as: LOPRESSOR  Take 25 mg by mouth 2 (two) times daily.     multivitamin with minerals Tabs tablet  Take 1 tablet by mouth daily.     oxyCODONE-acetaminophen 5-325 MG tablet  Commonly known as: PERCOCET/ROXICET  Take 1 tablet by mouth every 4 (four) hours as needed for moderate pain.     pantoprazole 40 MG tablet  Commonly known as: PROTONIX  Take 1 tablet (40 mg total) by mouth daily.     polyethylene glycol packet  Commonly known as: MIRALAX / GLYCOLAX  Take 17 g by mouth daily as needed for mild constipation or moderate constipation.     senna 8.6 MG Tabs tablet  Commonly known as: SENOKOT  Take 1 tablet by mouth 2 (two) times daily.     senna-docusate 8.6-50 MG tablet  Commonly known as: Senokot-S  Take 1 tablet by mouth at bedtime as needed for mild constipation.     simvastatin 5 MG tablet  Commonly known as: ZOCOR  Take 5 mg by mouth at bedtime.      trihexyphenidyl 5 MG tablet  Commonly known as: ARTANE  Take 5 mg by mouth 2 (two) times daily with a meal.           Relevant Imaging Results:  Relevant Lab Results:   Additional Information SSN: [id number]  Benard Halsted, LCSWA

## 2015-05-01 NOTE — Progress Notes (Signed)
Physical Therapy Treatment Patient Details Name: Craig Bentley MRN: [id number] DOB: 1955/02/28 Today's Date: 05/01/2015    History of Present Illness Pt with chronic B external iliac occlusions and R foot gangrene. No s/p prolonged vascular procedures with multiple bypass and amputation of R 5th toe. PMH: COPD, HTN, schizophrenia, NIIDM.     PT Comments    Patient more alert and less confused today. Able to independently donn post op shoe. Seems more in tune with safety today but still some safety concerns as pt taking hand off RW to wave at people during gait training with stumbling noted but no LOB. Improved ambulation distance. Continues to have pain in right foot. Will continue to follow per current POC.   Follow Up Recommendations  Home health PT;Supervision for mobility/OOB     Equipment Recommendations  None recommended by PT    Recommendations for Other Services       Precautions / Restrictions Precautions Precautions: Fall Restrictions Weight Bearing Restrictions: Yes RLE Weight Bearing: Weight bearing as tolerated Other Position/Activity Restrictions: in post op shoe    Mobility  Bed Mobility Overal bed mobility: Needs Assistance Bed Mobility: Rolling;Sidelying to Sit;Sit to Sidelying Rolling: Modified independent (Device/Increase time) Sidelying to sit: Modified independent (Device/Increase time)     Sit to sidelying: Modified independent (Device/Increase time) General bed mobility comments: HOB flat, no use of rails to simulate home environment. Good technique.  Transfers Overall transfer level: Needs assistance Equipment used: Rolling walker (2 wheeled) Transfers: Sit to/from Stand Sit to Stand: Min guard         General transfer comment: cues for hand placement  Ambulation/Gait Ambulation/Gait assistance: Min guard Ambulation Distance (Feet): 210 Feet Assistive device: Rolling walker (2 wheeled) Gait Pattern/deviations: Trunk flexed;Step-through  pattern;Decreased stride length Gait velocity: decreased   General Gait Details: Pt with flexed trunk and RW too far forward to CoM. Adjusted RW to help with posture. Cues for upright posture and RW proximity. Taking arm off RW to wave with stumbling noted.  2/4 DOE.    Stairs            Wheelchair Mobility    Modified Rankin (Stroke Patients Only)       Balance Overall balance assessment: Needs assistance Sitting-balance support: Feet supported;No upper extremity supported Sitting balance-Leahy Scale: Good Sitting balance - Comments: Able to reach outside BoS and donn flat bottom shoe without assist today.  No LOB.   Standing balance support: During functional activity Standing balance-Leahy Scale: Fair                      Cognition Arousal/Alertness: Awake/alert Behavior During Therapy: WFL for tasks assessed/performed Overall Cognitive Status: Impaired/Different from baseline Area of Impairment: Safety/judgement         Safety/Judgement: Decreased awareness of safety;Decreased awareness of deficits     General Comments: Seems less confused today.  Waiting for therapist to be present and ready prior to mobility, "ready for me to stand?"     Exercises      General Comments        Pertinent Vitals/Pain Pain Assessment: 0-10 Pain Score: 5  Pain Location: right foot Pain Descriptors / Indicators: Sore Pain Intervention(s): Monitored during session;Repositioned    Home Living                      Prior Function            PT Goals (current goals can now be found  in the care plan section) Progress towards PT goals: Progressing toward goals    Frequency  Min 3X/week    PT Plan Current plan remains appropriate    Co-evaluation             End of Session Equipment Utilized During Treatment: Gait belt Activity Tolerance: Patient tolerated treatment well Patient left: in bed;with nursing/sitter in room;with call bell/phone  within reach     Time: 0831-0846 PT Time Calculation (min) (ACUTE ONLY): 15 min  Charges:  $Gait Training: 8-22 mins                    G Codes:      Bobbette Eakes A Laverle Pillard 05/01/2015, 9:48 AM Mylo Red, PT, DPT [phone]

## 2015-05-01 NOTE — Progress Notes (Addendum)
   Daily Progress Note  Assessment/Planning: POD #8 s/p ABF, R CFA to PT bypass with composite vein/Propaten, L CFA to SFA bypass with NR ips GSV, R 5th toe amp   Amp site healing  ABI ordered  Functional status improved   BM x 2 yesterday (large per pt) -> advance diet  Ok to D/C group home with service if the group home can accomodate the home PT/OT otherwise needs SNF.   Will need SCW to determine which of these two options is possible  Ok to D/C today/tomorrow once all service arranged  Subjective  - 8 Days Post-Op  BM x 2 (large per pt)  Objective Filed Vitals:   04/30/15 1327 04/30/15 1330 04/30/15 2111 05/01/15 0519  BP: 113/66  123/59 123/53  Pulse: 86 87 79 78  Temp: 98.2 F (36.8 C)  99.1 F (37.3 C) 98.7 F (37.1 C)  TempSrc: Oral  Tympanic Oral  Resp: '17 18 18 18  '$ Height:      Weight:      SpO2: 90% 93% 93% 97%    Intake/Output Summary (Last 24 hours) at 05/01/15 0833 Last data filed at 05/01/15 0524  Gross per 24 hour  Intake    720 ml  Output   1175 ml  Net   -455 ml    PULM  CTAB CV  RRR GI  soft, distension somewhat less, appropriate TTP, -G/R, staples in place VASC  Both groin incisions c/d/i, staples in place in L leg, warm feet, palpable R PT, faintly palpable LT PT  Laboratory CBC    Component Value Date/Time   WBC 11.0* 04/30/2015 0814   HGB 10.5* 04/30/2015 0814   HCT 33.1* 04/30/2015 0814   PLT 270 04/30/2015 0814    BMET    Component Value Date/Time   NA 139 04/30/2015 0814   K 4.1 04/30/2015 0814   CL 96* 04/30/2015 0814   CO2 32 04/30/2015 0814   GLUCOSE 153* 04/30/2015 0814   BUN 15 04/30/2015 0814   CREATININE 0.83 04/30/2015 0814   CALCIUM 9.1 04/30/2015 0814   GFRNONAA >60 04/30/2015 0814   GFRAA >60 04/30/2015 [place], MD [facility]: [phone] Pager: [phone]  05/01/2015, 8:33 AM   Addendum  Marked improvement in ABI.     RIGHT     LEFT    PRESSURE WAVEFORM  PRESSURE WAVEFORM  BRACHIAL 145 Tri BRACHIAL 135 Tri  DP   DP    AT 169 Tri AT 138 Tri  PT 154 Tri PT 124 Tri  PER   PER    GREAT TOE  NA GREAT TOE  NA    RIGHT LEFT  ABI 1.17 0.95           Adele Barthel, MD [facility] Office: [phone] Pager: [phone]  05/01/2015, 2:21 PM

## 2015-05-01 NOTE — Progress Notes (Signed)
VASCULAR LAB PRELIMINARY  ARTERIAL  ABI completed:    RIGHT    LEFT    PRESSURE WAVEFORM  PRESSURE WAVEFORM  BRACHIAL 145 Tri BRACHIAL 135 Tri  DP   DP    AT 169 Tri AT 138 Tri  PT 154 Tri PT 124 Tri  PER   PER    GREAT TOE  NA GREAT TOE  NA    RIGHT LEFT  ABI 1.17 0.95     Landry Mellow, RDMS, RVT  05/01/2015, 10:52 AM

## 2015-05-01 NOTE — Care Management Note (Addendum)
Case Management Note  Patient Details  Name: Craig Bentley MRN: [id number] Date of Birth: October 15, 1955  Subjective/Objective:   60 y.o. Male from [place] who had developed post op Ileus which has now resolved. BM x2 04/30/2015. Tolerated regular diet today.                  Action/Plan:Discharged to ALF today with HHPT/HHOT resumed with [facility]. Craig Bentley, the representative is aware and will transition this pt into community. No further CM needs at this time.    Expected Discharge Date:                  Expected Discharge Plan:     In-House Referral:     Discharge planning Services  CM Consult  Post Acute Care Choice:  Home Health Choice offered to:  Patient  DME Arranged:    DME Agency:     HH Arranged:  PT, OT HH Agency:  [place] ([facility])  Status of Service:  Completed, signed off  Medicare Important Message Given:    Date Medicare IM Given:    Medicare IM give by:    Date Additional Medicare IM Given:    Additional Medicare Important Message give by:     If discussed at [place] of Stay Meetings, dates discussed:  05/01/2015  Additional Comments:  Delrae Sawyers, RN 05/01/2015, 10:32 AM

## 2015-05-01 NOTE — Progress Notes (Signed)
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 No s/p prolonged vascular procedures with multiple bypass and amputation of R 5th toe. PMH: COPD, HTN, schizophrenia, NIIDM.    OT comments  Pt continues to demonstrate decreased safety awareness. Reminded to keep walker with him at all times. Overall performing at a supervision level. Eager to go home.  Follow Up Recommendations  Home health OT (at ALF)    Equipment Recommendations  None recommended by OT    Recommendations for Other Services      Precautions / Restrictions Precautions Precautions: Fall Restrictions RLE Weight Bearing: Weight bearing as tolerated Other Position/Activity Restrictions: in post op shoe       Mobility Bed Mobility                  Transfers Overall transfer level: Needs assistance Equipment used: Rolling walker (2 wheeled)   Sit to Stand: Supervision              Balance     Sitting balance-Leahy Scale: Good       Standing balance-Leahy Scale: Fair                     ADL       Grooming: Supervision/safety;Wash/dry hands;Standing               Lower Body Dressing: Supervision/safety;Sit to/from stand   Toilet Transfer: Supervision/safety;RW;Ambulation;Comfort height toilet   Toileting- Clothing Manipulation and Hygiene: Supervision/safety;Sit to/from stand       Functional mobility during ADLs: Supervision/safety;Rolling walker        Vision                     Perception     Praxis      Cognition   Behavior During Therapy: WFL for tasks assessed/performed Overall Cognitive Status: Impaired/Different from baseline Area of Impairment: Safety/judgement                General Comments: Pt walking about his room without RW upon OTs arrival.     [facility] Assessment               Exercises     Shoulder Instructions       General Comments      Pertinent Vitals/ Pain       Pain Assessment: Faces Faces Pain Scale: Hurts a little bit Pain Location: R foot Pain Descriptors / Indicators: Sore  Home Living                                          Prior Functioning/Environment              Frequency Min 2X/week     Progress Toward Goals  OT Goals(current goals can now be found in the care plan section)  Progress towards OT goals: Goals met/education completed, patient discharged from OT  Acute Rehab OT Goals Patient Stated Goal: go home  Plan Discharge plan remains appropriate    Co-evaluation                 End of Session Equipment Utilized During Treatment: Gait belt;Rolling walker   Activity Tolerance Patient tolerated treatment well   Patient Left in chair;with  call bell/phone within reach;with chair alarm set   Nurse Communication          Time: [phone] OT Time Calculation (min): 14 min  Charges: OT General Charges $OT Visit: 1 Procedure OT Treatments $Self Care/Home Management : 8-22 mins  [name] 05/01/2015, 4:07 PM  [phone]

## 2015-05-01 NOTE — Progress Notes (Signed)
[facility] progress note-follow-up consult note   PATIENT DETAILS Name: Craig Bentley Age: 60 y.o. Sex: male Date of Birth: 30-Jul-1955 Admit Date: 04/23/2015 Admitting Physician Conrad , MD MHW:[id number], SADAOU, NP  Subjective:  no further vomiting-tolerating liquids well. Had bowel movement twice yesterday. Passing flatus this morning.  Assessment/Plan: Active Problems: Postoperative ileus:  Resolving with supportive care, advance diet. Continue bowel regimen, suspect constipation playing a big role here.  Essential hypertension: Controlled, continue HCTZ-if potassium continues to be low-given ongoing ileus-may need to be changed to an alternative agent.  Type 2 diabetes: CBGs stable, discontinue metformin while inpatient. Continue SSI  History of schizophrenia: Appears to be stable-continue usual medications  Acute hypoxic respiratory failure: Occurred postoperatively, required ICU monitoring with ventilator support-seems to have resolved.  Anemia: Stable. Follow.  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  Disposition: Per primary service- should be okay to discharge either today or tomorrow- as long as tolerating advancement in diet.  Antimicrobial agents  See below  Anti-infectives    Start     Dose/Rate Route Frequency Ordered Stop   04/24/15 0200  cefUROXime (ZINACEF) 1.5 g in dextrose 5 % 50 mL IVPB     1.5 g 100 mL/hr over 30 Minutes Intravenous Every 12 hours 04/23/15 2034 04/24/15 1453   04/23/15 1315  cefUROXime (ZINACEF) 1.5 g in dextrose 5 % 50 mL IVPB  Status:  Discontinued     1.5 g 100 mL/hr over 30 Minutes Intravenous To [facility] 04/23/15 1311 04/23/15 2017   04/23/15 0730  cefUROXime (ZINACEF) 1.5 g in dextrose 5 % 50 mL IVPB     1.5 g 100 mL/hr over 30 Minutes  Intravenous To [facility] 04/22/15 2009 04/23/15 1734      DVT Prophylaxis: Prophylactic Lovenox   Code Status: Full code   Family Communication None at bedside  Time spent:  20 minutes  MEDICATIONS: Scheduled Meds: . antiseptic oral rinse  7 mL Mouth Rinse q12n4p  . chlorhexidine  15 mL Mouth Rinse BID  . cloZAPine  100 mg Oral BID WC   And  . cloZAPine  400 mg Oral QHS  . docusate sodium  100 mg Oral Daily  . donepezil  5 mg Oral QHS  . enoxaparin (LOVENOX) injection  40 mg Subcutaneous Q24H  . gabapentin  300 mg Oral TID  . haloperidol  10 mg Oral QHS  . hydrochlorothiazide  25 mg Oral Daily  . insulin aspart  0-9 Units Subcutaneous TID WC  . lactulose  30 g Oral BID  . lisinopril  20 mg Oral Daily  . magnesium oxide  800 mg Oral Once  . magnesium sulfate 1 - 4 g bolus IVPB  2 g Intravenous Once  . metoprolol tartrate  25 mg Oral BID  . multivitamin with minerals  1 tablet Oral Daily  . pantoprazole  40 mg Oral Daily  . polyethylene glycol  17 g Oral BID  . senna  1 tablet Oral BID  . simvastatin  5 mg Oral QHS  . sodium phosphate  1 enema Rectal Once  . trihexyphenidyl  5 mg Oral BID WC   Continuous Infusions:  PRN Meds:.acetaminophen **OR** acetaminophen, albuterol, alum & mag hydroxide-simeth, bisacodyl, dicyclomine, guaifenesin, guaiFENesin-dextromethorphan, ipratropium-albuterol, labetalol, LORazepam, morphine injection, ondansetron, oxyCODONE-acetaminophen, phenol, senna-docusate, sodium phosphate  PHYSICAL EXAM: Vital signs in last 24 hours: Filed Vitals:   04/30/15 1327 04/30/15 1330 04/30/15 2111 05/01/15 0519  BP: 113/66  123/59 123/53  Pulse: 86 87 79 78  Temp: 98.2 F (36.8 C)  99.1 F (37.3 C) 98.7 F (37.1 C)  TempSrc: Oral  Tympanic Oral  Resp: '17 18 18 18  '$ Height:      Weight:      SpO2: 90% 93% 93% 97%    Weight change:  Filed Weights   04/23/15 0704 04/24/15 0500  Weight: 79.833 kg (176 lb) 83.2 kg (183 lb 6.8 oz)    Body mass index is 25.23 kg/(m^2).   Gen Exam: Awake and alert with clear speech.   Neck: Supple, No JVD.   Chest: B/L Clear.   CVS: S1 S2 Regular, no murmurs.  Abdomen: soft, BS +, non tender, staples in place. Extremities: no edema, lower extremities warm to touch. Neurologic: Non Focal.   Skin: No Rash.  Wounds: N/A.    Intake/Output from previous day:  Intake/Output Summary (Last 24 hours) at 05/01/15 1151 Last data filed at 05/01/15 0800  Gross per 24 hour  Intake    720 ml  Output    875 ml  Net   -155 ml     LAB RESULTS: CBC  Recent Labs Lab 04/27/15 0409 04/27/15 1235 04/28/15 0416 04/29/15 0245 04/30/15 0814  WBC 11.8* 11.4* 8.5 11.9* 11.0*  HGB 7.8* 7.7* 7.1* 10.0* 10.5*  HCT 24.3* 23.7* 21.5* 29.7* 33.1*  PLT 166 169 179 189 270  MCV 80.7 80.3 82.1 83.2 84.9  MCH 25.9* 26.1 27.1 28.0 26.9  MCHC 32.1 32.5 33.0 33.7 31.7  RDW 13.6 13.7 13.9 14.0 14.5  LYMPHSABS  --  2.5  --   --   --   MONOABS  --  0.9  --   --   --   EOSABS  --  0.0  --   --   --   BASOSABS  --  0.0  --   --   --     Chemistries   Recent Labs Lab 04/25/15 0426  04/27/15 0409 04/28/15 0416 04/29/15 0245 04/30/15 0500 04/30/15 0814 05/01/15 0800  NA 138  < > 138 137 138  --  139 137  K 3.4*  < > 3.1* 3.0* 3.6  --  4.1 4.0  CL 103  < > 103 104 99*  --  96* 96*  CO2 25  < > '25 25 26  '$ --  32 33*  GLUCOSE 139*  < > 150* 264* 148*  --  153* 155*  BUN 10  < > '9 6 7  '$ --  15 12  CREATININE 0.72  < > 0.68 0.66 0.76 0.77 0.83 0.78  CALCIUM 7.9*  < > 8.3* 7.8* 8.7*  --  9.1 9.2  MG 1.5*  --   --  1.5* 1.6*  --  2.2 1.9  < > = values in this interval not displayed.  CBG:  Recent Labs Lab 04/30/15 1120 04/30/15 1645 04/30/15 2107 05/01/15 0632 05/01/15 1101  GLUCAP 126* 122* 175* 126* 115*    GFR Estimated Creatinine Clearance: 106.3 mL/min (by C-G formula based on Cr of 0.78).  Coagulation profile No results for input(s): INR, PROTIME in the last 168  hours.  Cardiac Enzymes No results for input(s): CKMB, TROPONINI, MYOGLOBIN in the last 168 hours.  Invalid input(s): CK  Invalid input(s): POCBNP No results for input(s): DDIMER in the last  72 hours. No results for input(s): HGBA1C in the last 72 hours. No results for input(s): CHOL, HDL, LDLCALC, TRIG, CHOLHDL, LDLDIRECT in the last 72 hours. No results for input(s): TSH, T4TOTAL, T3FREE, THYROIDAB in the last 72 hours.  Invalid input(s): FREET3 No results for input(s): VITAMINB12, FOLATE, FERRITIN, TIBC, IRON, RETICCTPCT in the last 72 hours. No results for input(s): LIPASE, AMYLASE in the last 72 hours.  Urine Studies No results for input(s): UHGB, CRYS in the last 72 hours.  Invalid input(s): UACOL, UAPR, USPG, UPH, UTP, UGL, UKET, UBIL, UNIT, UROB, ULEU, UEPI, UWBC, URBC, UBAC, CAST, UCOM, BILUA  MICROBIOLOGY: No results found for this or any previous visit (from the past 240 hour(s)).  RADIOLOGY STUDIES/RESULTS: Ct Angio Ao+bifem W/cm &/or Wo/cm  04/12/2015  CLINICAL DATA:  Gangrenous left foot. EXAM: CT ANGIOGRAPHY AOBIFEM WITHOUT AND WITH CONTRAST TECHNIQUE: Arterial phase CT images of the abdomen, pelvis and complete bilateral lower extremities were obtained, with multi plantar maximum intensity projection reformats generated. CONTRAST:  172m OMNIPAQUE IOHEXOL 350 MG/ML SOLN COMPARISON:  09/25/2012 CT abdomen/ pelvis. FINDINGS: Lower chest: There are multiple slightly irregular thin-walled cystic structures at both lung bases, which have slightly increased in size since 09/25/2012. Mild circumferential wall thickening in the lower thoracic esophagus. Hepatobiliary: Normal liver with no liver mass. Normal gallbladder with no radiopaque cholelithiasis. No biliary ductal dilatation. Pancreas: Normal, with no mass or duct dilation. Spleen: Normal size. No mass. Adrenals/Urinary Tract: There is stable irregular nodular thickening of both adrenal glands measuring up to 15 mm thickness  in the left adrenal gland, in keeping with nodular adrenal hyperplasia versus multiple adenomas. No hydronephrosis. No contour deforming renal mass. Nonspecific diffuse bladder wall thickening. Stomach/Bowel: Grossly normal stomach. Normal caliber small bowel with no small bowel wall thickening. Normal appendix. No large bowel wall thickening or pericolonic fat stranding. No bowel pneumatosis. Vascular/Lymphatic: Abdominal Aorta: Atherosclerotic nonaneurysmal abdominal aorta. Celiac Artery: Patent. SMA: Patent. IMA: High-grade atherosclerotic stenosis at the origin, approximately 70%. Renal Arteries: Two renal arteries bilaterally, all patent. Patent renal veins. No pathologically enlarged lymph nodes in the abdomen or pelvis. Reproductive: Stable top-normal size prostate with nonspecific internal prostatic calcification. Other: No pneumoperitoneum, ascites or focal fluid collection. Musculoskeletal: No aggressive appearing focal osseous lesions. Stable chronic mild L4 vertebral compression fracture. Mild-to-moderate degenerative changes in the visualized thoracolumbar spine. RIGHT Lower Extremity: Common Iliac Artery: Marked atherosclerosis, with no high-grade stenosis. External Iliac Artery: Occlusion just distal to the right common iliac artery bifurcation, new since 09/25/2012 Internal Iliac Artery: Marked atherosclerosis, with no high-grade stenosis. Common Femoral Artery: Reconstituted proximally by a right abdominal muscle wall collaterals, no high-grade stenosis. Superficial Femoral Artery: Occluded proximally just beyond the right common femoral artery bifurcation, with distal reconstitution via profunda collaterals. Deep Femoral Artery: Atherosclerotic and patent, with no high-grade stenosis. Popliteal Artery: Occluded distally. Tibioperonal Trunk: Reconstituted proximally by periarticular collaterals, atherosclerotic with no high-grade stenosis. Anterior Tibial Artery: Reconstituted proximal and by  periarticular collaterals, atherosclerotic without appreciable high-grade stenosis. Posterior Tibial Artery: Atherosclerotic and patent with runoff to the posterior foot arterial arcade. Peroneal Artery: Occluded distally just above the ankle. Dorsalis Pedis Artery: Patent with runoff to the anterior foot arterial arcade. LEFT Lower Extremity: Common Iliac Artery: Marked atherosclerosis, with no high-grade stenosis. External Iliac Artery: Proximal occlusion just distal to the left common iliac artery bifurcation, which is chronic and unchanged since 09/25/2012. Internal Iliac Artery: Marked atherosclerosis, with no high-grade stenosis. Common Femoral Artery: Occluded. Superficial Femoral Artery: Reconstituted proximally by  collaterals, atherosclerotic without high-grade stenosis. Deep Femoral Artery: Patent. Popliteal Artery: Atherosclerotic and patent, with no high-grade stenosis. Tibioperonal Trunk: Atherosclerotic and patent with no high-grade stenosis. Anterior Tibial Artery: Atherosclerotic and patent with no high-grade stenosis. Posterior Tibial Artery: Atherosclerotic and patent with runoff to the posterior left foot arterial arcade. Peroneal Artery: Occluded proximally. Dorsalis Pedis Artery: Patent with runoff to the anterior foot arterial arcade. Review of the MIP images confirms the above findings. IMPRESSION: 1. 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 Two-vessel runoff to the left foot, fed predominantly by left internal iliac and left abdominopelvic muscle wall collaterals. 2. Right external iliac artery occlusion, new since 09/25/2012. Proximal right superficial femoral artery occlusion. Distal right popliteal artery occlusion. Two-vessel runoff to the right foot, fed predominantly by profunda collaterals. 3. No acute abnormality in the abdomen or pelvis. Nonspecific diffuse bladder wall thickening, probably chronic, correlate with urinalysis to exclude acute urinary tract  infection. 4. Mild progression of thin-walled cysts at both lung bases, which could indicate a nonspecific cystic interstitial lung disease. Correlate with high-resolution chest CT on a short term outpatient basis as clinically warranted. Electronically Signed   By: Ilona Sorrel M.D.   On: 04/12/2015 16:45   Mr Foot Right W Wo Contrast  04/02/2015  CLINICAL DATA:  Diabetic foot infection.  Fifth digit is black. EXAM: MRI OF THE RIGHT FOREFOOT WITHOUT AND WITH CONTRAST TECHNIQUE: Multiplanar, multisequence MR imaging was performed both before and after administration of intravenous contrast. CONTRAST:  58m MULTIHANCE GADOBENATE DIMEGLUMINE 529 MG/ML IV SOLN COMPARISON:  None. FINDINGS: Minimal marrow edema in the first distal phalanx without cortical destruction. No other marrow signal abnormality. No acute fracture dislocation. No bone destruction or periosteal reaction. Mild osteoarthritis of the first MTP joint. There is no soft tissue fluid collection or hematoma. There is mild generalized soft tissue edema along the dorsal aspect of the midfoot with mild enhancement which may reflect reactive edema versus mild cellulitis. The muscles are normal. The visualized flexor and extensor tendons are intact. IMPRESSION: 1. No evidence of osteomyelitis of the right foot. Electronically Signed   By: HKathreen Devoid  On: 04/02/2015 18:44   Nm Myocar Multi W/spect W/wall Motion / Ef  04/12/2015  CLINICAL DATA:  60year old male with right foot gangrene. Cardiac clearance prior to surgery. EXAM: MYOCARDIAL IMAGING WITH SPECT (REST AND PHARMACOLOGIC-STRESS) GATED LEFT VENTRICULAR WALL MOTION STUDY LEFT VENTRICULAR EJECTION FRACTION TECHNIQUE: Standard myocardial SPECT imaging was performed after resting intravenous injection of 10 mCi [id number]. Subsequently, intravenous infusion of Lexiscan was performed under the supervision of the Cardiology staff. At peak effect of the drug, 30 mCi [id number] was  injected intravenously and standard myocardial SPECT imaging was performed. Quantitative gated imaging was also performed to evaluate left ventricular wall motion, and estimate left ventricular ejection fraction. COMPARISON:  CT scan of the chest 02/20/2015 FINDINGS: Perfusion: No decreased activity in the left ventricle on stress imaging to suggest reversible ischemia or infarction. Wall Motion: Normal left ventricular wall motion. No left ventricular dilation. Left Ventricular Ejection Fraction: 56 % End diastolic volume 92 ml End systolic volume 40 ml IMPRESSION: 1. No reversible ischemia or infarction. 2. Normal left ventricular wall motion. 3. Left ventricular ejection fraction 56% 4. Low-risk stress test findings*. *2012 Appropriate Use Criteria for Coronary Revascularization Focused Update: J Am Coll Cardiol. 2013212;24(8):[email or web address] Electronically Signed   By: HeaJacqulynn CadetD.   On: 04/12/2015 13:08   Dg Chest Port 1  View  04/24/2015  CLINICAL DATA:  Postoperative respiratory failure following progressed peripheral vascular procedure EXAM: PORTABLE CHEST 1 VIEW COMPARISON:  Portable chest x-ray of April 23, 2015 FINDINGS: The lungs are mildly hypoinflated. There are infiltrates at both bases. The cardiac silhouette is top-normal in size. The pulmonary vascularity is less engorged and more distinct today. The endotracheal tube tip lies 4.2 cm above the carina. The esophagogastric tube tip projects below the inferior margin of the image. The Swan-Ganz catheter tip projects in the proximal main pulmonary artery outflow tract. IMPRESSION: Slight interval improvement in bibasilar atelectasis or pneumonia. Decreased pulmonary vascular congestion. The support tubes are in reasonable position. Electronically Signed   By: David  Martinique M.D.   On: 04/24/2015 07:48   Dg Chest Port 1 View  04/23/2015  CLINICAL DATA:  Postop patient.  Status post aortic  bypass. EXAM: PORTABLE CHEST 1 VIEW COMPARISON:  02/19/2015 FINDINGS: Endotracheal tube tip projects 3.2 cm above the carina. Orogastric tube passes below the diaphragm and below the included field of view. Right internal jugular Swan-Ganz catheter tip projects in the main pulmonary artery. There is hazy perihilar and lung base airspace opacity. This is likely atelectasis. No pneumothorax. Cardiac silhouette is normal in size. No mediastinal or hilar masses. IMPRESSION: 1. Support apparatus is well positioned as described. 2. Perihilar and lung base hazy airspace opacity. This is most likely atelectasis. Electronically Signed   By: Lajean Manes M.D.   On: 04/23/2015 21:44   Dg Abd Portable 1v  04/23/2015  CLINICAL DATA:  60 year old male with history of aortic bypass procedure. EXAM: PORTABLE ABDOMEN - 1 VIEW COMPARISON:  Abdominal radiograph 09/16/2013. FINDINGS: Gas and stool are noted throughout the colon extending to the level of the distal rectum. No pathologic dilatation of small bowel. No overt pneumoperitoneum confidently identified on today's single portable supine view. Midline skin staples. Nasogastric screw projecting over the stomach. IMPRESSION: 1. Nonobstructive bowel gas pattern. 2. No pneumoperitoneum. 3. Postoperative changes and support apparatus, as above. Electronically Signed   By: Vinnie Langton M.D.   On: 04/23/2015 21:43   Dg Abd Portable 2v  05/01/2015  CLINICAL DATA:  Ileus, postop 2 weeks EXAM: PORTABLE ABDOMEN - 2 VIEW COMPARISON:  04/30/15 FINDINGS: Persistent gaseous distended small bowel loops in mid abdomen highly suspicious for ileus. Skin staples abdominal and pelvic wall again noted. Moderate stool noted in descending colon. Some colonic gas and stool noted in sigmoid colon. There is no evidence of free abdominal air. IMPRESSION: Persistent gaseous distended small bowel loops mid abdomen suspicious for ileus. Again noted postsurgical changes abdominal wall. Moderate stool  noted in left colon. Some colonic gas and stool noted and sigmoid colon. Electronically Signed   By: Lahoma Crocker M.D.   On: 05/01/2015 08:02   Dg Abd Portable 2v  04/30/2015  CLINICAL DATA:  Vomiting.  Evaluate for small bowel obstruction. EXAM: PORTABLE ABDOMEN - 2 VIEW COMPARISON:  04/23/2015 . FINDINGS: Surgical staples noted abdomen. Dilated loops of small and large bowel with scattered air-fluid level noted suggesting adynamic ileus. Moderate stool burden. No free air. IMPRESSION: 1. Dilated loops of small and large bowel with scattered air-fluid levels suggesting adynamic ileus. No free air. Follow-up exam can be obtained to exclude developing obstruction. 2. Moderate stool burden. Electronically Signed   By: Marcello Moores  Register   On: 04/30/2015 10:05   Dg Foot Complete Right  04/02/2015  CLINICAL DATA:  Right pinky toe is black, with dorsal soft tissue swelling and erythema  along the foot. Initial encounter. EXAM: RIGHT FOOT COMPLETE - 3+ VIEW COMPARISON:  None. FINDINGS: There is no evidence of fracture or dislocation. No definite osseous erosion is characterized, though evaluation for osteomyelitis is limited on radiograph. The joint spaces are preserved. There is no evidence of talar subluxation; the subtalar joint is unremarkable in appearance. An os peroneum is seen. Mild dorsal soft tissue swelling is noted. IMPRESSION: 1. No evidence of fracture or dislocation. No definite osseous erosion seen, though evaluation for osteomyelitis is limited on radiograph. 2. Os peroneum seen. Electronically Signed   By: Garald Balding M.D.   On: 04/02/2015 06:47    Oren Binet, MD  [facility] Pager:[phone]  If 7PM-7AM, please contact night-coverage [email or web address] Password [name] 05/01/2015, 11:51 AM   LOS: 8 days

## 2015-05-05 ENCOUNTER — Telehealth: Payer: Self-pay | Admitting: Vascular Surgery

## 2015-05-05 NOTE — Telephone Encounter (Signed)
Spoke with rep at [facility] to arrange, dpm

## 2015-05-05 NOTE — Telephone Encounter (Signed)
-----   Message from Denman George, RN sent at 05/01/2015 11:33 AM EST ----- Regarding: needs 2 wk. f/u with [facility]   ----- Message -----    From: Alvia Grove, PA-C    Sent: 05/01/2015   9:41 AM      To: [facility] Charge Pool  S/p  ABF, R CFA to PT bypass with composite vein/Propaten, L CFA to SFA bypass with NR ips GSV, R 5th toe amp 04/23/15  F/u with Dr. Bridgett Larsson in 2 weeks  Thanks Maudie Mercury

## 2015-05-12 ENCOUNTER — Encounter: Payer: Self-pay | Admitting: Vascular Surgery

## 2015-05-15 NOTE — Progress Notes (Signed)
    Post-operative Open AAA Repair  History of Present Illness  Craig Bentley is a 60 y.o. male who presents post-op s/p ABF, R CFA to PTA with composite graft, L CFA To SFA BPG w/ ips reveresed GSV, R 5th toe amputation (Date: 04/25/15).  The patient has no had back or abdominal pain.  His foot pain has resolved.  For VQI Use Only  PRE-ADM LIVING: Home  AMB STATUS: Ambulatory  Physical Examination  Filed Vitals:   05/16/15 1254  BP: 95/62  Pulse: 92  Temp: 97.5 F (36.4 C)  Resp: 18  Height: 5' 11.5" (1.816 m)  Weight: 174 lb (78.926 kg)  SpO2: 99%    Gastrointestinal: soft, NTND, -G/R, - HSM, - masses, - CVAT B, staples in placed, midline incision healed  B groin: inc c/d/i, healed  LLE: vein harvest inc healed, staples in place  R foot: 5th toe incision nearly healed, mild maceration , sutures in place  B feet: pedal pulse palpable  Medical Decision Making  Craig Bentley is a 60 y.o. male who presents s/p ABF, R CFA to PTA w/ composite graft, L CFA to SFA bpyass with ips rGSV   The patient is healing appropriately.  The patient will follow up with [place] in 3 months for: BLE ABI, BLE arterial duplex to evaluate his bypasses.  Thank you for allowing [place] to participate in this patient's care.   Adele Barthel, MD [facility] Office: [phone] Pager: [phone]  05/16/2015, 1:12 PM

## 2015-05-16 ENCOUNTER — Other Ambulatory Visit: Payer: Self-pay | Admitting: *Deleted

## 2015-05-16 ENCOUNTER — Encounter: Payer: Self-pay | Admitting: Vascular Surgery

## 2015-05-16 ENCOUNTER — Ambulatory Visit (INDEPENDENT_AMBULATORY_CARE_PROVIDER_SITE_OTHER): Payer: Self-pay | Admitting: Vascular Surgery

## 2015-05-16 VITALS — BP 95/62 | HR 92 | Temp 97.5°F | Resp 18 | Ht 71.5 in | Wt 174.0 lb

## 2015-05-16 DIAGNOSIS — I739 Peripheral vascular disease, unspecified: Secondary | ICD-10-CM

## 2015-05-16 DIAGNOSIS — I70261 Atherosclerosis of native arteries of extremities with gangrene, right leg: Secondary | ICD-10-CM

## 2015-05-29 ENCOUNTER — Telehealth: Payer: Self-pay

## 2015-05-29 NOTE — Telephone Encounter (Signed)
Rec'd phone call from Wells Guiles at [facility].  Questioned about orders for wound care to right 5th toe incision.  Stated the [facility] nurse is seeing the pt. 2x/week and following orders for wound care.  Reported that [facility] does not have any orders.  Per Dr. Lianne Moris last office note, "bilat. Groin incision healed, left LE vein harvest incision healed, and right 5th toe amp incision nearly healed with mild maceration" present.  Advised Wells Guiles to wash incisions with dial soap and water and rinse well, daily.  Advised to apply clean dry gauze over the right 5th toe amp site for protection until skin is completely intact.  Encouraged to call office if any worsening of symptoms or concern.  Verb. Understanding.

## 2015-08-01 ENCOUNTER — Other Ambulatory Visit: Payer: Self-pay | Admitting: Internal Medicine

## 2015-08-01 ENCOUNTER — Encounter (HOSPITAL_BASED_OUTPATIENT_CLINIC_OR_DEPARTMENT_OTHER): Payer: Medicaid Other | Attending: Internal Medicine

## 2015-08-01 DIAGNOSIS — T8781 Dehiscence of amputation stump: Secondary | ICD-10-CM | POA: Insufficient documentation

## 2015-08-01 DIAGNOSIS — E11621 Type 2 diabetes mellitus with foot ulcer: Secondary | ICD-10-CM | POA: Insufficient documentation

## 2015-08-01 DIAGNOSIS — F209 Schizophrenia, unspecified: Secondary | ICD-10-CM | POA: Diagnosis not present

## 2015-08-01 DIAGNOSIS — I10 Essential (primary) hypertension: Secondary | ICD-10-CM | POA: Insufficient documentation

## 2015-08-01 DIAGNOSIS — L97512 Non-pressure chronic ulcer of other part of right foot with fat layer exposed: Secondary | ICD-10-CM | POA: Insufficient documentation

## 2015-08-01 DIAGNOSIS — J449 Chronic obstructive pulmonary disease, unspecified: Secondary | ICD-10-CM | POA: Diagnosis not present

## 2015-08-01 DIAGNOSIS — E1142 Type 2 diabetes mellitus with diabetic polyneuropathy: Secondary | ICD-10-CM | POA: Insufficient documentation

## 2015-08-01 DIAGNOSIS — Y835 Amputation of limb(s) as the cause of abnormal reaction of the patient, or of later complication, without mention of misadventure at the time of the procedure: Secondary | ICD-10-CM | POA: Insufficient documentation

## 2015-08-01 DIAGNOSIS — Z89421 Acquired absence of other right toe(s): Secondary | ICD-10-CM | POA: Diagnosis not present

## 2015-08-01 DIAGNOSIS — E1151 Type 2 diabetes mellitus with diabetic peripheral angiopathy without gangrene: Secondary | ICD-10-CM | POA: Diagnosis not present

## 2015-08-01 DIAGNOSIS — E1169 Type 2 diabetes mellitus with other specified complication: Secondary | ICD-10-CM | POA: Insufficient documentation

## 2015-08-01 DIAGNOSIS — Z95820 Peripheral vascular angioplasty status with implants and grafts: Secondary | ICD-10-CM | POA: Diagnosis not present

## 2015-08-01 DIAGNOSIS — M86471 Chronic osteomyelitis with draining sinus, right ankle and foot: Secondary | ICD-10-CM | POA: Diagnosis not present

## 2015-08-07 ENCOUNTER — Encounter: Payer: Self-pay | Admitting: Vascular Surgery

## 2015-08-08 DIAGNOSIS — E11621 Type 2 diabetes mellitus with foot ulcer: Secondary | ICD-10-CM | POA: Diagnosis not present

## 2015-08-15 ENCOUNTER — Ambulatory Visit (HOSPITAL_COMMUNITY)
Admission: RE | Admit: 2015-08-15 | Discharge: 2015-08-15 | Disposition: A | Discharge status: Home / Self Care | Payer: Medicaid Other | Source: Ambulatory Visit | Attending: Vascular Surgery | Admitting: Vascular Surgery

## 2015-08-15 ENCOUNTER — Ambulatory Visit (INDEPENDENT_AMBULATORY_CARE_PROVIDER_SITE_OTHER)
Admission: RE | Admit: 2015-08-15 | Discharge: 2015-08-15 | Disposition: A | Discharge status: Home / Self Care | Payer: Medicaid Other | Source: Ambulatory Visit | Attending: Vascular Surgery | Admitting: Vascular Surgery

## 2015-08-15 ENCOUNTER — Ambulatory Visit: Payer: Medicaid Other | Admitting: Vascular Surgery

## 2015-08-15 ENCOUNTER — Encounter: Payer: Self-pay | Admitting: Vascular Surgery

## 2015-08-15 DIAGNOSIS — I70201 Unspecified atherosclerosis of native arteries of extremities, right leg: Secondary | ICD-10-CM | POA: Insufficient documentation

## 2015-08-15 DIAGNOSIS — I739 Peripheral vascular disease, unspecified: Secondary | ICD-10-CM | POA: Diagnosis not present

## 2015-08-15 DIAGNOSIS — I70303 Unspecified atherosclerosis of unspecified type of bypass graft(s) of the extremities, bilateral legs: Secondary | ICD-10-CM | POA: Diagnosis not present

## 2015-08-15 DIAGNOSIS — Z95828 Presence of other vascular implants and grafts: Secondary | ICD-10-CM | POA: Diagnosis not present

## 2015-08-15 DIAGNOSIS — E11621 Type 2 diabetes mellitus with foot ulcer: Secondary | ICD-10-CM | POA: Diagnosis not present

## 2015-08-17 ENCOUNTER — Emergency Department (HOSPITAL_COMMUNITY)
Admission: EM | Admit: 2015-08-17 | Discharge: 2015-08-17 | Disposition: A | Discharge status: Home / Self Care | Payer: Medicaid Other | Attending: Emergency Medicine | Admitting: Emergency Medicine

## 2015-08-17 ENCOUNTER — Encounter (HOSPITAL_COMMUNITY): Payer: Self-pay | Admitting: Emergency Medicine

## 2015-08-17 DIAGNOSIS — Z7984 Long term (current) use of oral hypoglycemic drugs: Secondary | ICD-10-CM | POA: Diagnosis not present

## 2015-08-17 DIAGNOSIS — L97519 Non-pressure chronic ulcer of other part of right foot with unspecified severity: Secondary | ICD-10-CM | POA: Diagnosis not present

## 2015-08-17 DIAGNOSIS — Z7982 Long term (current) use of aspirin: Secondary | ICD-10-CM | POA: Insufficient documentation

## 2015-08-17 DIAGNOSIS — T8189XD Other complications of procedures, not elsewhere classified, subsequent encounter: Secondary | ICD-10-CM

## 2015-08-17 DIAGNOSIS — I70239 Atherosclerosis of native arteries of right leg with ulceration of unspecified site: Secondary | ICD-10-CM | POA: Insufficient documentation

## 2015-08-17 DIAGNOSIS — T8189XA Other complications of procedures, not elsewhere classified, initial encounter: Secondary | ICD-10-CM | POA: Diagnosis not present

## 2015-08-17 DIAGNOSIS — I10 Essential (primary) hypertension: Secondary | ICD-10-CM | POA: Insufficient documentation

## 2015-08-17 DIAGNOSIS — Z79899 Other long term (current) drug therapy: Secondary | ICD-10-CM | POA: Diagnosis not present

## 2015-08-17 DIAGNOSIS — Y69 Unspecified misadventure during surgical and medical care: Secondary | ICD-10-CM | POA: Diagnosis not present

## 2015-08-17 DIAGNOSIS — J449 Chronic obstructive pulmonary disease, unspecified: Secondary | ICD-10-CM | POA: Diagnosis not present

## 2015-08-17 DIAGNOSIS — F17210 Nicotine dependence, cigarettes, uncomplicated: Secondary | ICD-10-CM | POA: Insufficient documentation

## 2015-08-17 DIAGNOSIS — M79674 Pain in right toe(s): Secondary | ICD-10-CM | POA: Diagnosis present

## 2015-08-17 DIAGNOSIS — E119 Type 2 diabetes mellitus without complications: Secondary | ICD-10-CM | POA: Insufficient documentation

## 2015-08-17 DIAGNOSIS — I739 Peripheral vascular disease, unspecified: Secondary | ICD-10-CM

## 2015-08-17 DIAGNOSIS — F259 Schizoaffective disorder, unspecified: Secondary | ICD-10-CM | POA: Insufficient documentation

## 2015-08-17 DIAGNOSIS — L97909 Non-pressure chronic ulcer of unspecified part of unspecified lower leg with unspecified severity: Secondary | ICD-10-CM

## 2015-08-17 MED ORDER — FENTANYL 25 MCG/HR TD PT72
50.0000 ug | MEDICATED_PATCH | TRANSDERMAL | Status: DC
  Administered 2015-08-17: 50 ug via TRANSDERMAL
  Filled 2015-08-17: qty 2

## 2015-08-17 MED ORDER — FENTANYL 50 MCG/HR TD PT72: 50.0000 ug | MEDICATED_PATCH | TRANSDERMAL | Status: DC

## 2015-08-17 MED ORDER — OXYCODONE-ACETAMINOPHEN 5-325 MG PO TABS
2.0000 | ORAL_TABLET | Freq: Once | ORAL | Status: AC
  Administered 2015-08-17: 2 via ORAL
  Filled 2015-08-17: qty 2

## 2015-08-17 MED ORDER — SULFAMETHOXAZOLE-TRIMETHOPRIM 800-160 MG PO TABS: 1.0000 | ORAL_TABLET | Freq: Two times a day (BID) | ORAL | Status: DC

## 2015-08-17 MED ORDER — SULFAMETHOXAZOLE-TRIMETHOPRIM 800-160 MG PO TABS
1.0000 | ORAL_TABLET | Freq: Once | ORAL | Status: AC
  Administered 2015-08-17: 1 via ORAL
  Filled 2015-08-17: qty 1

## 2015-08-17 NOTE — ED Notes (Signed)
Pulse present with doppler on right foot. MD verified.

## 2015-08-17 NOTE — ED Provider Notes (Signed)
CSN: [id number]     Arrival date & time 08/17/15  0540 History   First MD Initiated Contact with Patient 08/17/15 [phone]     Chief Complaint  Patient presents with  . Foot Pain   PT IS A 60 YO BM WHO LIVES AT [facility].  THE PT HAD AN AORTOBIFEMORAL BYPASS WITH RIGHT 5TH TOE AMPUTATION ON 04/23/15.  THE PT HAS A NONHEALING WOUND TO THE AREA OF HIS RIGHT 5TH TOE.  THE PT ALSO HAS A NONHEALING WOUND TO THE DORSUM OF HIS RIGHT FOOT.  PT HAS A HOME HEALTH WOUND CARE NURSE WHO HAS BEEN DRESSING HIS WOUND.  HE ALSO SAW THE WOUND CARE DR YESTERDAY WHO DEBRIDED WOUNDS.  PT HAS AN APPT WITH VASCULAR ON Friday, June 30TH.  PT SAID THAT HIS PAIN IS WORSENING AND THE PAIN MEDICINE THAT HE IS ON IS NOT HELPING.  (Consider location/radiation/quality/duration/timing/severity/associated sxs/prior Treatment) Patient is a 60 y.o. male presenting with lower extremity pain. The history is provided by the patient.  Foot Pain This is a recurrent problem. The current episode started more than 1 week ago. The problem occurs constantly.    Past Medical History  Diagnosis Date  . Hypertension   . Diabetes mellitus   . Schizophrenia, schizo-affective ([place])   . COPD (chronic obstructive pulmonary disease) ([place])   . Constipation   . Fecal impaction ([place])   . Pneumonia   . Anxiety    Past Surgical History  Procedure Laterality Date  . Peripheral vascular catheterization N/A 04/07/2015    Procedure: Abdominal Aortogram;  Surgeon: Angelia Mould, MD;  Location: [place] [facility];  Service: Cardiovascular;  Laterality: N/A;  . Aorta - bilateral femoral artery bypass graft Bilateral 04/23/2015    Procedure: AORTOBIFEMORAL BYPASS GRAFT;  Surgeon: Conrad East Spencer, MD;  Location: [place];  Service: Vascular;  Laterality: Bilateral;  . Femoral-tibial bypass graft Right 04/23/2015    Procedure: Right Common Femoral to Posterior Tibial Bypass with Composite Vein Graft. ;  Surgeon: Conrad Templeton, MD;  Location: [place];  Service:  Vascular;  Laterality: Right;  . Amputation Right 04/23/2015    Procedure: Right Fifth Toe Amputation;  Surgeon: Conrad Sebring, MD;  Location: [facility] OR;  Service: Vascular;  Laterality: Right;  . Femoral-femoral bypass graft Left 04/23/2015    Procedure:  Left Common Femoral Artery to Superficial Femoral Artery Bypass Graft with Vein;  Surgeon: Conrad Emmett, MD;  Location: [facility] OR;  Service: Vascular;  Laterality: Left;   Family History  Problem Relation Age of Onset  . Mental illness Mother    Social History  Substance Use Topics  . Smoking status: Current Every Day Smoker -- 1.50 packs/day for 50 years    Types: Cigarettes  . Smokeless tobacco: Never Used  . Alcohol Use: No    Review of Systems  Musculoskeletal:       RIGHT FOOT PAIN  Skin: Positive for wound.  All other systems reviewed and are negative.     Allergies  Review of patient's allergies indicates no known allergies.  Home Medications   Prior to Admission medications   Medication Sig Start Date End Date Taking? Authorizing Provider  acetaminophen (TYLENOL) 325 MG tablet Take 650 mg by mouth every 6 (six) hours as needed for mild pain, fever or headache.     Historical Provider, MD  albuterol (PROVENTIL HFA;VENTOLIN HFA) 108 (90 BASE) MCG/ACT inhaler Inhale 2 puffs into the lungs every 4 (four) hours as needed  for wheezing or shortness of breath. Reported on 05/16/2015    Historical Provider, MD  albuterol (PROVENTIL) (2.5 MG/3ML) 0.083% nebulizer solution Take 3 mLs (2.5 mg total) by nebulization every 4 (four) hours as needed for wheezing. Patient not taking: Reported on 05/16/2015 02/19/15   Theodis Blaze, MD  aspirin EC 81 MG tablet Take 1 tablet (81 mg total) by mouth daily. 05/01/15   Alvia Grove, PA-C  cloZAPine (CLOZARIL) 100 MG tablet Take 100-400 mg by mouth 3 (three) times daily. Takes 1 tablet every morning and afternoon Takes 4 tablets at bedtime    Historical Provider, MD  dicyclomine (BENTYL) 20 MG tablet  Take 20 mg by mouth 2 (two) times daily as needed for spasms.    Historical Provider, MD  donepezil (ARICEPT) 5 MG tablet Take 5 mg by mouth at bedtime.    Historical Provider, MD  fentaNYL (DURAGESIC) 50 MCG/HR Place 1 patch (50 mcg total) onto the skin every 3 (three) days. 08/17/15   Isla Pence, MD  gabapentin (NEURONTIN) 300 MG capsule Take 300 mg by mouth 3 (three) times daily.    Historical Provider, MD  guaifenesin (ROBITUSSIN) 100 MG/5ML syrup Take 200 mg by mouth 4 (four) times daily as needed for cough.    Historical Provider, MD  haloperidol (HALDOL) 10 MG tablet Take 10 mg by mouth at bedtime.    Historical Provider, MD  hydrochlorothiazide (HYDRODIURIL) 25 MG tablet Take 25 mg by mouth daily.    Historical Provider, MD  ipratropium-albuterol (DUONEB) 0.5-2.5 (3) MG/3ML SOLN Take 3 mLs by nebulization 3 (three) times daily. 02/19/15   Theodis Blaze, MD  lactulose (CHRONULAC) 10 GM/15ML solution Take 30 g by mouth 2 (two) times daily.    Historical Provider, MD  lisinopril (PRINIVIL,ZESTRIL) 20 MG tablet Take 20 mg by mouth daily.    Historical Provider, MD  metFORMIN (GLUCOPHAGE) 500 MG tablet Take 500 mg by mouth 2 (two) times daily with a meal.    Historical Provider, MD  metoprolol tartrate (LOPRESSOR) 25 MG tablet Take 25 mg by mouth 2 (two) times daily.    Historical Provider, MD  Multiple Vitamin (MULITIVITAMIN WITH MINERALS) TABS Take 1 tablet by mouth daily.    Historical Provider, MD  oxyCODONE-acetaminophen (PERCOCET/ROXICET) 5-325 MG tablet Take 1 tablet by mouth every 4 (four) hours as needed for moderate pain. 05/01/15   Alvia Grove, PA-C  pantoprazole (PROTONIX) 40 MG tablet Take 1 tablet (40 mg total) by mouth daily. 02/20/15   Theodis Blaze, MD  polyethylene glycol (MIRALAX / Floria Raveling) packet Take 17 g by mouth daily as needed for mild constipation or moderate constipation.    Historical Provider, MD  senna (SENOKOT) 8.6 MG TABS Take 1 tablet by mouth 2 (two) times  daily.    Historical Provider, MD  senna-docusate (SENOKOT-S) 8.6-50 MG tablet Take 1 tablet by mouth at bedtime as needed for mild constipation. 05/01/15   Alvia Grove, PA-C  simvastatin (ZOCOR) 5 MG tablet Take 5 mg by mouth at bedtime.    Historical Provider, MD  sulfamethoxazole-trimethoprim (BACTRIM DS,SEPTRA DS) 800-160 MG tablet Take 1 tablet by mouth 2 (two) times daily. 08/17/15 08/24/15  Isla Pence, MD  trihexyphenidyl (ARTANE) 5 MG tablet Take 5 mg by mouth 2 (two) times daily with a meal.    Historical Provider, MD   BP 134/65 mmHg  Pulse 75  Temp(Src) 97.9 F (36.6 C) (Oral)  Resp 18  SpO2 100% Physical Exam  Constitutional:  He is oriented to person, place, and time. He appears well-developed and well-nourished.  HENT:  Head: Normocephalic and atraumatic.  Right Ear: External ear normal.  Left Ear: External ear normal.  Nose: Nose normal.  Mouth/Throat: Oropharynx is clear and moist.  Eyes: Conjunctivae and EOM are normal. Pupils are equal, round, and reactive to light.  Neck: Normal range of motion. Neck supple.  Cardiovascular: Normal rate, regular rhythm and normal heart sounds.   Pulses:      Dorsalis pedis pulses are 1+ on the right side, and 2+ on the left side.  RIGHT FOOT PULSE IS DOPPLERABLE, BUT IT IS VERY WEAK.  Pulmonary/Chest: Effort normal and breath sounds normal.  Abdominal: Soft. Bowel sounds are normal.  Musculoskeletal:  RIGHT FOOT PAIN  Neurological: He is alert and oriented to person, place, and time.  Skin:     SKIN TO RIGHT FOOT IS DUSKY WITH PAIN TO PALPATION.  REDNESS AROUND WOUND WITH REDNESS TRAVELING UP FOOT.  Nursing note and vitals reviewed.   ED Course  Procedures (including critical care time) Labs Review Labs Reviewed  AEROBIC CULTURE (SUPERFICIAL SPECIMEN)    Imaging Review No results found. I have personally reviewed and evaluated these images and lab results as part of my medical decision-making.   EKG  Interpretation None      MDM  PT D/W DR. CHEN (VASCULAR) WHO OPERATED ON PT IN [facility] AND KNOWS PT.  HE DOES NOT THINK THERE IS ANYTHING TO DO EMERGENTLY AS HE IS AWARE THAT GRAFT HAS FAILED.  HE SAID TO TREAT HIS PAIN AND ENCOURAGE PT TO KEEP HIS APPT ON THE 30TH.  HE SAID PT WILL MOST LIKELY NEED AN AMPUTATION AND HE WANTS TO HAVE THAT CONVERSATION IN AN OFFICE SETTING.  PT DOES NOT WANT ANY SURGERY UNTIL AFTER THE 4TH OF July. WE DID A WOUND CULTURE OF FOOT AND I WILL START PT ON BACTRIM FOR INFECTION.  I WILL ALSO START PT ON FENTANYL AS WELL AS CONTINUE HIS PERCOCET FOR HIS PAIN. Final diagnoses:  Peripheral vascular disease of lower extremity with ulceration ([place])  Nonhealing surgical wound, subsequent encounter      Isla Pence, MD 08/17/15 1008

## 2015-08-17 NOTE — ED Notes (Signed)
Discharge instructions, follow up care, and rx x2 reviewed with patient. Patient verbalized understanding. 

## 2015-08-17 NOTE — ED Notes (Signed)
MD at bedside. 

## 2015-08-17 NOTE — ED Notes (Signed)
Patient given coffee, sandwich, and saltine crackers per MD request.

## 2015-08-17 NOTE — ED Notes (Signed)
[facility] notified of transportation need

## 2015-08-17 NOTE — ED Notes (Signed)
[facility] [address], [place]/o right foot pain. V/s on arrival bp 152/68, hr 76, sp02 92, rr 16.  Copd, HTN,  Foot infection and right great toe amputation 2 months ago.

## 2015-08-20 ENCOUNTER — Emergency Department (HOSPITAL_COMMUNITY)
Admission: EM | Admit: 2015-08-20 | Discharge: 2015-08-20 | Disposition: A | Discharge status: Home / Self Care | Payer: Medicaid Other | Attending: Emergency Medicine | Admitting: Emergency Medicine

## 2015-08-20 ENCOUNTER — Encounter (HOSPITAL_COMMUNITY): Payer: Self-pay | Admitting: *Deleted

## 2015-08-20 DIAGNOSIS — Z792 Long term (current) use of antibiotics: Secondary | ICD-10-CM | POA: Diagnosis not present

## 2015-08-20 DIAGNOSIS — L97519 Non-pressure chronic ulcer of other part of right foot with unspecified severity: Secondary | ICD-10-CM | POA: Diagnosis not present

## 2015-08-20 DIAGNOSIS — F17210 Nicotine dependence, cigarettes, uncomplicated: Secondary | ICD-10-CM | POA: Diagnosis not present

## 2015-08-20 DIAGNOSIS — Z4800 Encounter for change or removal of nonsurgical wound dressing: Secondary | ICD-10-CM | POA: Diagnosis present

## 2015-08-20 DIAGNOSIS — I739 Peripheral vascular disease, unspecified: Secondary | ICD-10-CM

## 2015-08-20 DIAGNOSIS — Z79899 Other long term (current) drug therapy: Secondary | ICD-10-CM | POA: Insufficient documentation

## 2015-08-20 DIAGNOSIS — J449 Chronic obstructive pulmonary disease, unspecified: Secondary | ICD-10-CM | POA: Insufficient documentation

## 2015-08-20 DIAGNOSIS — Z791 Long term (current) use of non-steroidal anti-inflammatories (NSAID): Secondary | ICD-10-CM | POA: Diagnosis not present

## 2015-08-20 DIAGNOSIS — D729 Disorder of white blood cells, unspecified: Secondary | ICD-10-CM

## 2015-08-20 DIAGNOSIS — D72829 Elevated white blood cell count, unspecified: Secondary | ICD-10-CM | POA: Insufficient documentation

## 2015-08-20 DIAGNOSIS — Z7984 Long term (current) use of oral hypoglycemic drugs: Secondary | ICD-10-CM | POA: Diagnosis not present

## 2015-08-20 DIAGNOSIS — D649 Anemia, unspecified: Secondary | ICD-10-CM | POA: Diagnosis not present

## 2015-08-20 DIAGNOSIS — F1190 Opioid use, unspecified, uncomplicated: Secondary | ICD-10-CM

## 2015-08-20 DIAGNOSIS — E11621 Type 2 diabetes mellitus with foot ulcer: Secondary | ICD-10-CM | POA: Diagnosis not present

## 2015-08-20 DIAGNOSIS — I10 Essential (primary) hypertension: Secondary | ICD-10-CM | POA: Insufficient documentation

## 2015-08-20 DIAGNOSIS — F209 Schizophrenia, unspecified: Secondary | ICD-10-CM

## 2015-08-20 LAB — BASIC METABOLIC PANEL
Anion gap: 9 (ref 5–15)
BUN: 37 mg/dL — ABNORMAL HIGH (ref 6–20)
CALCIUM: 9.2 mg/dL (ref 8.9–10.3)
CO2: 28 mmol/L (ref 22–32)
CREATININE: 1.08 mg/dL (ref 0.61–1.24)
Chloride: 101 mmol/L (ref 101–111)
GFR calc Af Amer: >60 mL/min (ref >60)
GLUCOSE: 90 mg/dL (ref 65–99)
Potassium: 4.3 mmol/L (ref 3.5–5.1)
Sodium: 138 mmol/L (ref 135–145)

## 2015-08-20 LAB — CBC
HEMATOCRIT: 28.9 % — AB (ref 39.0–52.0)
Hemoglobin: 9.2 g/dL — ABNORMAL LOW (ref 13.0–17.0)
MCH: 26.7 pg (ref 26.0–34.0)
MCHC: 31.8 g/dL (ref 30.0–36.0)
MCV: 83.8 fL (ref 78.0–100.0)
PLATELETS: 283 10*3/uL (ref 150–400)
RBC: 3.45 MIL/uL — ABNORMAL LOW (ref 4.22–5.81)
RDW: 14.6 % (ref 11.5–15.5)
WBC: 14.7 10*3/uL — ABNORMAL HIGH (ref 4.0–10.5)

## 2015-08-20 LAB — AEROBIC CULTURE W GRAM STAIN (SUPERFICIAL SPECIMEN): Special Requests: NORMAL

## 2015-08-20 LAB — CBG MONITORING, ED: Glucose-Capillary: 96 mg/dL (ref 65–99)

## 2015-08-20 MED ORDER — NALOXONE HCL 0.4 MG/ML IJ SOLN
0.2000 mg | Freq: Once | INTRAMUSCULAR | Status: DC
  Filled 2015-08-20: qty 1

## 2015-08-20 NOTE — Discharge Instructions (Signed)
1. Medications: usual home medications - your Fentanyl patches were removed in the ED today due to severe lethargy.  Decrease your home dose to 23mg (1 patch) at a time.   2. Treatment: rest, drink plenty of fluids, keep wounds clean 3. Follow Up: Please followup with Vascular Surgery at your appointment in 2 days for discussion of your diagnoses and further evaluation after today's visit; if you do not have a primary care doctor use the resource guide provided to find one; Please return to the ER for fever, chills, nausea, vomiting or altered mental status.

## 2015-08-20 NOTE — ED Provider Notes (Signed)
CSN: [id number]     Arrival date & time 08/20/15  1128 History   First MD Initiated Contact with Patient 08/20/15 1215     Chief Complaint  Patient presents with  . Wound Check     (Consider location/radiation/quality/duration/timing/severity/associated sxs/prior Treatment) The history is provided by the patient, the EMS personnel and medical records. No language interpreter was used.     Craig Bentley is a 60 y.o. male  with a hx of HTN, COPD, schizophrenia, anxiety presents to the Emergency Department complaining of gradual, persistent, progressively worsening right foot wound onset 2 weeks ago.  Pt had aortobifemoral bypass with right 5th toe amputation on 04/23/15.  Pt has nonhealing wound to the dorsum of his right foot and to the area of his right 5th toe.  Pt lives in [place] and has a wound care RN who has been dressing the wounds.  Per EMS the RN believes they are not healing fast enough.  Pt has an appointment with vascular in 2 day on June 30th.  Record review shows he presented 3 days ago for same.  At that time the pt was discussed with Dr. Bridgett Larsson (Vascular surgery) who felt there was nothing emergent and requested that the pt be seen in the clinic for discussion of an amputation.  He was d/c home with bactrim and fentanyl for pain control.    Level V caveat for altered mental status  Past Medical History  Diagnosis Date  . Hypertension   . Diabetes mellitus   . Schizophrenia, schizo-affective ([place])   . COPD (chronic obstructive pulmonary disease) ([place])   . Constipation   . Fecal impaction ([place])   . Pneumonia   . Anxiety    Past Surgical History  Procedure Laterality Date  . Peripheral vascular catheterization N/A 04/07/2015    Procedure: Abdominal Aortogram;  Surgeon: Angelia Mould, MD;  Location: [place] [facility];  Service: Cardiovascular;  Laterality: N/A;  . Aorta - bilateral femoral artery bypass graft Bilateral 04/23/2015    Procedure: AORTOBIFEMORAL BYPASS GRAFT;   Surgeon: Conrad [place], MD;  Location: [place];  Service: Vascular;  Laterality: Bilateral;  . Femoral-tibial bypass graft Right 04/23/2015    Procedure: Right Common Femoral to Posterior Tibial Bypass with Composite Vein Graft. ;  Surgeon: Conrad Van Buren, MD;  Location: [place];  Service: Vascular;  Laterality: Right;  . Amputation Right 04/23/2015    Procedure: Right Fifth Toe Amputation;  Surgeon: Conrad [place], MD;  Location: [facility] OR;  Service: Vascular;  Laterality: Right;  . Femoral-femoral bypass graft Left 04/23/2015    Procedure:  Left Common Femoral Artery to Superficial Femoral Artery Bypass Graft with Vein;  Surgeon: Conrad Belington, MD;  Location: [facility] OR;  Service: Vascular;  Laterality: Left;   Family History  Problem Relation Age of Onset  . Mental illness Mother    Social History  Substance Use Topics  . Smoking status: Current Every Day Smoker -- 1.50 packs/day for 50 years    Types: Cigarettes  . Smokeless tobacco: Never Used  . Alcohol Use: No    Review of Systems  Unable to perform ROS: Mental status change      Allergies  Review of patient's allergies indicates no known allergies.  Home Medications   Prior to Admission medications   Medication Sig Start Date End Date Taking? Authorizing Provider  acetaminophen (TYLENOL) 325 MG tablet Take 650 mg by mouth every 6 (six) hours as needed for mild pain, fever  or headache.    Yes Historical Provider, MD  albuterol (PROVENTIL HFA;VENTOLIN HFA) 108 (90 BASE) MCG/ACT inhaler Inhale 2 puffs into the lungs every 4 (four) hours as needed for wheezing or shortness of breath. Reported on 05/16/2015   Yes Historical Provider, MD  albuterol (PROVENTIL) (2.5 MG/3ML) 0.083% nebulizer solution Take 3 mLs (2.5 mg total) by nebulization every 4 (four) hours as needed for wheezing. 02/19/15  Yes Theodis Blaze, MD  cloZAPine (CLOZARIL) 100 MG tablet Take 100-400 mg by mouth 3 (three) times daily. Takes 1 tablet every morning and afternoon Takes 4  tablets at bedtime   Yes Historical Provider, MD  dicyclomine (BENTYL) 20 MG tablet Take 20 mg by mouth 2 (two) times daily as needed for spasms.   Yes Historical Provider, MD  donepezil (ARICEPT) 5 MG tablet Take 5 mg by mouth at bedtime.   Yes Historical Provider, MD  gabapentin (NEURONTIN) 300 MG capsule Take 300 mg by mouth 3 (three) times daily.   Yes Historical Provider, MD  guaifenesin (ROBITUSSIN) 100 MG/5ML syrup Take 200 mg by mouth 4 (four) times daily as needed for cough.   Yes Historical Provider, MD  haloperidol (HALDOL) 5 MG tablet Take 5 mg by mouth at bedtime.   Yes Historical Provider, MD  hydrochlorothiazide (HYDRODIURIL) 25 MG tablet Take 25 mg by mouth daily.   Yes Historical Provider, MD  ibuprofen (ADVIL,MOTRIN) 200 MG tablet Take 400 mg by mouth 2 (two) times daily as needed for moderate pain.   Yes Historical Provider, MD  ipratropium-albuterol (DUONEB) 0.5-2.5 (3) MG/3ML SOLN Take 3 mLs by nebulization 3 (three) times daily. 02/19/15  Yes Theodis Blaze, MD  lactulose (CHRONULAC) 10 GM/15ML solution Take 30 g by mouth 2 (two) times daily.   Yes Historical Provider, MD  lisinopril (PRINIVIL,ZESTRIL) 20 MG tablet Take 20 mg by mouth daily.   Yes Historical Provider, MD  metFORMIN (GLUCOPHAGE) 500 MG tablet Take 500 mg by mouth 2 (two) times daily with a meal.   Yes Historical Provider, MD  metoprolol tartrate (LOPRESSOR) 25 MG tablet Take 25 mg by mouth 2 (two) times daily.   Yes Historical Provider, MD  Multiple Vitamin (MULITIVITAMIN WITH MINERALS) TABS Take 1 tablet by mouth daily.   Yes Historical Provider, MD  pantoprazole (PROTONIX) 40 MG tablet Take 1 tablet (40 mg total) by mouth daily. 02/20/15  Yes Theodis Blaze, MD  polyethylene glycol [facility] / Floria Raveling) packet Take 17 g by mouth daily as needed for mild constipation or moderate constipation.   Yes Historical Provider, MD  senna (SENOKOT) 8.6 MG TABS Take 1 tablet by mouth 2 (two) times daily.   Yes Historical  Provider, MD  senna-docusate (SENOKOT-S) 8.6-50 MG tablet Take 1 tablet by mouth at bedtime as needed for mild constipation. 05/01/15  Yes Alvia Grove, PA-C  simvastatin (ZOCOR) 5 MG tablet Take 5 mg by mouth at bedtime.   Yes Historical Provider, MD  sulfamethoxazole-trimethoprim (BACTRIM DS,SEPTRA DS) 800-160 MG tablet Take 1 tablet by mouth 2 (two) times daily. 08/17/15 08/24/15 Yes Isla Pence, MD  trihexyphenidyl (ARTANE) 5 MG tablet Take 5 mg by mouth 2 (two) times daily with a meal.   Yes Historical Provider, MD  aspirin EC 81 MG tablet Take 1 tablet (81 mg total) by mouth daily. Patient not taking: Reported on 08/20/2015 05/01/15   Alvia Grove, PA-C  cephALEXin (KEFLEX) 500 MG capsule Take 500 mg by mouth every 6 (six) hours. Reported on 08/20/2015  Historical Provider, MD  doxycycline (VIBRA-TABS) 100 MG tablet Take 100 mg by mouth 2 (two) times daily.    Historical Provider, MD  fentaNYL (DURAGESIC) 50 MCG/HR Place 1 patch (50 mcg total) onto the skin every 3 (three) days. Patient not taking: Reported on 08/20/2015 08/17/15   Isla Pence, MD  haloperidol (HALDOL) 10 MG tablet Take 10 mg by mouth at bedtime.    Historical Provider, MD  oxyCODONE-acetaminophen (PERCOCET/ROXICET) 5-325 MG tablet Take 1 tablet by mouth every 4 (four) hours as needed for moderate pain. Patient not taking: Reported on 08/20/2015 05/01/15   Janalyn Harder Trinh, PA-C   BP 111/58 mmHg  Pulse 99  Temp(Src) 98.8 F (37.1 C) (Oral)  Resp 18  SpO2 96% Physical Exam  Constitutional: He appears well-developed and well-nourished. He appears lethargic. No distress.  Awake, alert, nontoxic appearance  HENT:  Head: Normocephalic and atraumatic.  Mouth/Throat: Oropharynx is clear and moist. No oropharyngeal exudate.  Eyes: Conjunctivae are normal. No scleral icterus.  Neck: Normal range of motion. Neck supple.  Cardiovascular: Normal rate, regular rhythm and intact distal pulses.   Pulses:      Dorsalis pedis  pulses are 1+ on the right side, and 2+ on the left side.  Strong dopplerable pulse in the right foot  Pulmonary/Chest: Effort normal and breath sounds normal. No respiratory distress. He has no wheezes.  Equal chest expansion Two 25 g fentanyl patches noted to the left upper chest  Abdominal: Soft. Bowel sounds are normal. He exhibits no mass. There is no tenderness. There is no rebound and no guarding.  Large, well-healed surgical incision  Musculoskeletal: Normal range of motion. He exhibits no edema.  Dry, nonhealing wound to the dorsum of the right foot Nonhealing wound noted at the site of right fifth toe amputation with no drainage Mild surrounding erythema without induration, fluctuance or increased warmth Toes are cool  Neurological: He appears lethargic.  Patient is lethargic and speech is garbled Moves extremities without ataxia  Skin: Skin is warm and dry. He is not diaphoretic.  Psychiatric: He has a normal mood and affect.  Nursing note and vitals reviewed.   ED Course  Procedures (including critical care time) Labs Review Labs Reviewed  CBC - Abnormal; Notable for the following:    WBC 14.7 (*)    RBC 3.45 (*)    Hemoglobin 9.2 (*)    HCT 28.9 (*)    All other components within normal limits  BASIC METABOLIC PANEL - Abnormal; Notable for the following:    BUN 37 (*)    All other components within normal limits  CBG MONITORING, ED   MDM   Final diagnoses:  Chronic foot ulcer, right, with unspecified severity (HCC)  Schizophrenia, unspecified type ([place])  PVD (peripheral vascular disease) (HCC)  Neutrophilic leukocytosis  Anemia, unspecified anemia type  Narcotic drug use    Rami Budhu presents with nonhealing right foot wounds. Per previous visit this was discussed with vascular surgery who wish to have him follow-up on the outpatient. There is no concern for worsening infection at this time and patient is taking his Bactrim.  Altered mental status.  Previous note states that he was prescribed fentanyl patches for pain control. He is currently wearing 2 of these.  He has pinpoint pupils.  Suspect that his lethargy is secondary to narcotic overdose. Will give small amount of Narcan. I have removed both fentanyl patches.  2:54 PM Pt with improved mental status after Fentanyl patch removal.  He remains sleepy, but is more talkative.   The patient was discussed with and seen by Dr. Alvino Chapel who agrees with the treatment plan.   3:26 PM Pt is alert, eating and talking.  Difficulty obtaining IV and no narcan was required.  Labs with leukocytosis of 14.7 and anemia of 9.2 today.  Baseline in 10.2 (March 2017).  Pt denies rectal bleeding, abd pain. Discussed his narcotic induced lethargy and requested that he decrease his Fentanyl dose to 1 patch.  He is to f/u with vascular surgery at his appointment on 08/22/15.      Jarrett Soho Dayane [place]enburg, PA-C 08/20/15 [place], MD 08/20/15 [phone]

## 2015-08-20 NOTE — ED Notes (Signed)
Per EMS, pt from [facility] here for wound check. Pt had right foot toes removed ~3 months ago, incision opened 2 weeks ago with wound drainage. Wound care nurse feels wound is not healing fast enough. Pt reports visual hallucinations, pt has hx of visual hallucinations, denies SI/HI. Pt on doxycycline for wound.

## 2015-08-20 NOTE — ED Notes (Signed)
Bed: [facility] Expected date:  Expected time:  Means of arrival:  Comments: 

## 2015-08-20 NOTE — ED Notes (Signed)
Pt declined trial of crackers but requested [place] sandwich and coffee.  These were given to the patient. Currently eating.

## 2015-08-21 ENCOUNTER — Telehealth (HOSPITAL_BASED_OUTPATIENT_CLINIC_OR_DEPARTMENT_OTHER): Payer: Self-pay | Admitting: Emergency Medicine

## 2015-08-21 ENCOUNTER — Inpatient Hospital Stay (HOSPITAL_COMMUNITY): Payer: Medicaid Other

## 2015-08-21 ENCOUNTER — Encounter (HOSPITAL_COMMUNITY): Payer: Self-pay | Admitting: Internal Medicine

## 2015-08-21 ENCOUNTER — Inpatient Hospital Stay (HOSPITAL_COMMUNITY)
Admission: EM | Admit: 2015-08-21 | Discharge: 2015-08-28 | DRG: 239 | Disposition: A | Discharge status: Rehabilitation Facility | Payer: Medicaid Other | Attending: Internal Medicine | Admitting: Internal Medicine

## 2015-08-21 ENCOUNTER — Emergency Department (HOSPITAL_COMMUNITY): Payer: Medicaid Other

## 2015-08-21 DIAGNOSIS — Z79899 Other long term (current) drug therapy: Secondary | ICD-10-CM

## 2015-08-21 DIAGNOSIS — M86171 Other acute osteomyelitis, right ankle and foot: Secondary | ICD-10-CM

## 2015-08-21 DIAGNOSIS — F209 Schizophrenia, unspecified: Secondary | ICD-10-CM

## 2015-08-21 DIAGNOSIS — E0840 Diabetes mellitus due to underlying condition with diabetic neuropathy, unspecified: Secondary | ICD-10-CM

## 2015-08-21 DIAGNOSIS — E11628 Type 2 diabetes mellitus with other skin complications: Secondary | ICD-10-CM

## 2015-08-21 DIAGNOSIS — N179 Acute kidney failure, unspecified: Secondary | ICD-10-CM

## 2015-08-21 DIAGNOSIS — Z818 Family history of other mental and behavioral disorders: Secondary | ICD-10-CM | POA: Diagnosis not present

## 2015-08-21 DIAGNOSIS — E1169 Type 2 diabetes mellitus with other specified complication: Secondary | ICD-10-CM | POA: Diagnosis present

## 2015-08-21 DIAGNOSIS — R0603 Acute respiratory distress: Secondary | ICD-10-CM

## 2015-08-21 DIAGNOSIS — E11621 Type 2 diabetes mellitus with foot ulcer: Secondary | ICD-10-CM | POA: Diagnosis present

## 2015-08-21 DIAGNOSIS — J189 Pneumonia, unspecified organism: Secondary | ICD-10-CM

## 2015-08-21 DIAGNOSIS — E1149 Type 2 diabetes mellitus with other diabetic neurological complication: Secondary | ICD-10-CM | POA: Diagnosis present

## 2015-08-21 DIAGNOSIS — J439 Emphysema, unspecified: Secondary | ICD-10-CM

## 2015-08-21 DIAGNOSIS — Z89511 Acquired absence of right leg below knee: Secondary | ICD-10-CM | POA: Diagnosis not present

## 2015-08-21 DIAGNOSIS — J441 Chronic obstructive pulmonary disease with (acute) exacerbation: Secondary | ICD-10-CM

## 2015-08-21 DIAGNOSIS — Z79891 Long term (current) use of opiate analgesic: Secondary | ICD-10-CM

## 2015-08-21 DIAGNOSIS — D72829 Elevated white blood cell count, unspecified: Secondary | ICD-10-CM | POA: Diagnosis not present

## 2015-08-21 DIAGNOSIS — R4182 Altered mental status, unspecified: Secondary | ICD-10-CM

## 2015-08-21 DIAGNOSIS — L089 Local infection of the skin and subcutaneous tissue, unspecified: Secondary | ICD-10-CM

## 2015-08-21 DIAGNOSIS — G9340 Encephalopathy, unspecified: Secondary | ICD-10-CM

## 2015-08-21 DIAGNOSIS — Z01810 Encounter for preprocedural cardiovascular examination: Secondary | ICD-10-CM

## 2015-08-21 DIAGNOSIS — E1140 Type 2 diabetes mellitus with diabetic neuropathy, unspecified: Secondary | ICD-10-CM | POA: Diagnosis present

## 2015-08-21 DIAGNOSIS — I959 Hypotension, unspecified: Secondary | ICD-10-CM | POA: Diagnosis present

## 2015-08-21 DIAGNOSIS — D538 Other specified nutritional anemias: Secondary | ICD-10-CM

## 2015-08-21 DIAGNOSIS — E7800 Pure hypercholesterolemia, unspecified: Secondary | ICD-10-CM | POA: Diagnosis present

## 2015-08-21 DIAGNOSIS — D729 Disorder of white blood cells, unspecified: Secondary | ICD-10-CM

## 2015-08-21 DIAGNOSIS — F200 Paranoid schizophrenia: Secondary | ICD-10-CM | POA: Diagnosis present

## 2015-08-21 DIAGNOSIS — J410 Simple chronic bronchitis: Secondary | ICD-10-CM | POA: Diagnosis not present

## 2015-08-21 DIAGNOSIS — J449 Chronic obstructive pulmonary disease, unspecified: Secondary | ICD-10-CM | POA: Diagnosis present

## 2015-08-21 DIAGNOSIS — K229 Disease of esophagus, unspecified: Secondary | ICD-10-CM

## 2015-08-21 DIAGNOSIS — R509 Fever, unspecified: Secondary | ICD-10-CM | POA: Diagnosis not present

## 2015-08-21 DIAGNOSIS — E785 Hyperlipidemia, unspecified: Secondary | ICD-10-CM | POA: Diagnosis present

## 2015-08-21 DIAGNOSIS — I10 Essential (primary) hypertension: Secondary | ICD-10-CM | POA: Diagnosis present

## 2015-08-21 DIAGNOSIS — A419 Sepsis, unspecified organism: Secondary | ICD-10-CM

## 2015-08-21 DIAGNOSIS — F17210 Nicotine dependence, cigarettes, uncomplicated: Secondary | ICD-10-CM | POA: Diagnosis present

## 2015-08-21 DIAGNOSIS — M79671 Pain in right foot: Secondary | ICD-10-CM

## 2015-08-21 DIAGNOSIS — B9562 Methicillin resistant Staphylococcus aureus infection as the cause of diseases classified elsewhere: Secondary | ICD-10-CM | POA: Diagnosis present

## 2015-08-21 DIAGNOSIS — M868X7 Other osteomyelitis, ankle and foot: Secondary | ICD-10-CM | POA: Diagnosis not present

## 2015-08-21 DIAGNOSIS — A4102 Sepsis due to Methicillin resistant Staphylococcus aureus: Secondary | ICD-10-CM | POA: Diagnosis not present

## 2015-08-21 DIAGNOSIS — D62 Acute posthemorrhagic anemia: Secondary | ICD-10-CM | POA: Diagnosis not present

## 2015-08-21 DIAGNOSIS — M869 Osteomyelitis, unspecified: Secondary | ICD-10-CM

## 2015-08-21 DIAGNOSIS — D72828 Other elevated white blood cell count: Secondary | ICD-10-CM

## 2015-08-21 DIAGNOSIS — R0600 Dyspnea, unspecified: Secondary | ICD-10-CM

## 2015-08-21 DIAGNOSIS — Z7984 Long term (current) use of oral hypoglycemic drugs: Secondary | ICD-10-CM

## 2015-08-21 DIAGNOSIS — J9601 Acute respiratory failure with hypoxia: Secondary | ICD-10-CM

## 2015-08-21 DIAGNOSIS — I739 Peripheral vascular disease, unspecified: Secondary | ICD-10-CM | POA: Diagnosis not present

## 2015-08-21 DIAGNOSIS — I70261 Atherosclerosis of native arteries of extremities with gangrene, right leg: Secondary | ICD-10-CM

## 2015-08-21 DIAGNOSIS — Z7982 Long term (current) use of aspirin: Secondary | ICD-10-CM | POA: Diagnosis not present

## 2015-08-21 DIAGNOSIS — E1152 Type 2 diabetes mellitus with diabetic peripheral angiopathy with gangrene: Principal | ICD-10-CM | POA: Diagnosis present

## 2015-08-21 DIAGNOSIS — Z419 Encounter for procedure for purposes other than remedying health state, unspecified: Secondary | ICD-10-CM

## 2015-08-21 DIAGNOSIS — R739 Hyperglycemia, unspecified: Secondary | ICD-10-CM

## 2015-08-21 DIAGNOSIS — Z9889 Other specified postprocedural states: Secondary | ICD-10-CM

## 2015-08-21 DIAGNOSIS — F419 Anxiety disorder, unspecified: Secondary | ICD-10-CM

## 2015-08-21 DIAGNOSIS — T8131XD Disruption of external operation (surgical) wound, not elsewhere classified, subsequent encounter: Secondary | ICD-10-CM | POA: Diagnosis not present

## 2015-08-21 LAB — CBG MONITORING, ED
GLUCOSE-CAPILLARY: 135 mg/dL — AB (ref 65–99)
Glucose-Capillary: 131 mg/dL — ABNORMAL HIGH (ref 65–99)

## 2015-08-21 LAB — CBC WITH DIFFERENTIAL/PLATELET
BASOS ABS: 0 10*3/uL (ref 0.0–0.1)
Basophils Relative: 0 %
EOS ABS: 0 10*3/uL (ref 0.0–0.7)
Eosinophils Relative: 0 %
HCT: 29.3 % — ABNORMAL LOW (ref 39.0–52.0)
HEMOGLOBIN: 9.1 g/dL — AB (ref 13.0–17.0)
LYMPHS ABS: 1.5 10*3/uL (ref 0.7–4.0)
Lymphocytes Relative: 13 %
MCH: 26.1 pg (ref 26.0–34.0)
MCHC: 31.1 g/dL (ref 30.0–36.0)
MCV: 84.2 fL (ref 78.0–100.0)
Monocytes Absolute: 0.6 10*3/uL (ref 0.1–1.0)
Monocytes Relative: 5 %
NEUTROS PCT: 82 %
Neutro Abs: 9.8 10*3/uL — ABNORMAL HIGH (ref 1.7–7.7)
Platelets: 242 10*3/uL (ref 150–400)
RBC: 3.48 MIL/uL — AB (ref 4.22–5.81)
RDW: 14.3 % (ref 11.5–15.5)
WBC: 11.9 10*3/uL — AB (ref 4.0–10.5)

## 2015-08-21 LAB — URINALYSIS, ROUTINE W REFLEX MICROSCOPIC
Bilirubin Urine: NEGATIVE
Glucose, UA: NEGATIVE mg/dL
Hgb urine dipstick: NEGATIVE
Ketones, ur: NEGATIVE mg/dL
LEUKOCYTES UA: NEGATIVE
NITRITE: NEGATIVE
PROTEIN: NEGATIVE mg/dL
Specific Gravity, Urine: 1.022 (ref 1.005–1.030)
pH: 5.5 (ref 5.0–8.0)

## 2015-08-21 LAB — COMPREHENSIVE METABOLIC PANEL
ALBUMIN: 2 g/dL — AB (ref 3.5–5.0)
ALT: 21 U/L (ref 17–63)
AST: 24 U/L (ref 15–41)
Alkaline Phosphatase: 48 U/L (ref 38–126)
Anion gap: 6 (ref 5–15)
BUN: 39 mg/dL — AB (ref 6–20)
CALCIUM: 7.8 mg/dL — AB (ref 8.9–10.3)
CO2: 25 mmol/L (ref 22–32)
CREATININE: 1.51 mg/dL — AB (ref 0.61–1.24)
Chloride: 110 mmol/L (ref 101–111)
GFR calc non Af Amer: 49 mL/min — ABNORMAL LOW (ref >60)
GFR, EST AFRICAN AMERICAN: 56 mL/min — AB (ref >60)
GLUCOSE: 109 mg/dL — AB (ref 65–99)
Potassium: 3.9 mmol/L (ref 3.5–5.1)
SODIUM: 141 mmol/L (ref 135–145)
Total Bilirubin: 0.6 mg/dL (ref 0.3–1.2)
Total Protein: 5.3 g/dL — ABNORMAL LOW (ref 6.5–8.1)

## 2015-08-21 LAB — I-STAT TROPONIN, ED: TROPONIN I, POC: 0.01 ng/mL (ref 0.00–0.08)

## 2015-08-21 LAB — I-STAT CG4 LACTIC ACID, ED: Lactic Acid, Venous: 1.21 mmol/L (ref 0.5–1.9)

## 2015-08-21 MED ORDER — ONDANSETRON HCL 4 MG PO TABS: 4.0000 mg | ORAL_TABLET | Freq: Four times a day (QID) | ORAL | Status: DC | PRN

## 2015-08-21 MED ORDER — SODIUM CHLORIDE 0.9 % IV SOLN
INTRAVENOUS | Status: AC
  Administered 2015-08-22: 01:00:00 via INTRAVENOUS

## 2015-08-21 MED ORDER — ALBUTEROL SULFATE (2.5 MG/3ML) 0.083% IN NEBU
2.5000 mg | INHALATION_SOLUTION | RESPIRATORY_TRACT | Status: DC | PRN
Start: 2015-08-21 — End: 2015-08-22

## 2015-08-21 MED ORDER — INSULIN ASPART 100 UNIT/ML ~~LOC~~ SOLN
0.0000 [IU] | Freq: Three times a day (TID) | SUBCUTANEOUS | Status: DC
  Administered 2015-08-22 – 2015-08-23 (×4): 1 [IU] via SUBCUTANEOUS
  Administered 2015-08-24 – 2015-08-25 (×4): 2 [IU] via SUBCUTANEOUS
  Administered 2015-08-26 (×2): 1 [IU] via SUBCUTANEOUS
  Administered 2015-08-28 (×2): 2 [IU] via SUBCUTANEOUS

## 2015-08-21 MED ORDER — PIPERACILLIN-TAZOBACTAM 3.375 G IVPB
3.3750 g | Freq: Three times a day (TID) | INTRAVENOUS | Status: DC
Start: 2015-08-22 — End: 2015-08-28
  Administered 2015-08-22 – 2015-08-28 (×19): 3.375 g via INTRAVENOUS
  Filled 2015-08-21 (×22): qty 50

## 2015-08-21 MED ORDER — ACETAMINOPHEN 325 MG PO TABS
650.0000 mg | ORAL_TABLET | Freq: Once | ORAL | Status: AC
Start: 2015-08-21 — End: 2015-08-21
  Administered 2015-08-21: 650 mg via ORAL
  Filled 2015-08-21: qty 2

## 2015-08-21 MED ORDER — VANCOMYCIN HCL IN DEXTROSE 1-5 GM/200ML-% IV SOLN
1000.0000 mg | Freq: Once | INTRAVENOUS | Status: AC
  Administered 2015-08-21: 1000 mg via INTRAVENOUS
  Filled 2015-08-21: qty 200

## 2015-08-21 MED ORDER — ACETAMINOPHEN 650 MG RE SUPP: 650.0000 mg | Freq: Four times a day (QID) | RECTAL | Status: DC | PRN

## 2015-08-21 MED ORDER — NALOXONE HCL 0.4 MG/ML IJ SOLN
INTRAMUSCULAR | Status: AC
  Filled 2015-08-21: qty 1

## 2015-08-21 MED ORDER — SODIUM CHLORIDE 0.9 % IV BOLUS (SEPSIS)
1000.0000 mL | Freq: Once | INTRAVENOUS | Status: AC
  Administered 2015-08-21: 1000 mL via INTRAVENOUS

## 2015-08-21 MED ORDER — SIMVASTATIN 10 MG PO TABS
5.0000 mg | ORAL_TABLET | Freq: Every day | ORAL | Status: DC
  Administered 2015-08-22 – 2015-08-27 (×6): 5 mg via ORAL
  Filled 2015-08-21 (×8): qty 1

## 2015-08-21 MED ORDER — PIPERACILLIN-TAZOBACTAM 3.375 G IVPB 30 MIN
3.3750 g | Freq: Once | INTRAVENOUS | Status: AC
  Administered 2015-08-21: 3.375 g via INTRAVENOUS
  Filled 2015-08-21: qty 50

## 2015-08-21 MED ORDER — DONEPEZIL HCL 5 MG PO TABS
5.0000 mg | ORAL_TABLET | Freq: Every day | ORAL | Status: DC
  Administered 2015-08-22 – 2015-08-27 (×7): 5 mg via ORAL
  Filled 2015-08-21 (×8): qty 1

## 2015-08-21 MED ORDER — NALOXONE HCL 0.4 MG/ML IJ SOLN: 0.4000 mg | Freq: Once | INTRAMUSCULAR | Status: DC

## 2015-08-21 MED ORDER — IPRATROPIUM-ALBUTEROL 0.5-2.5 (3) MG/3ML IN SOLN
3.0000 mL | Freq: Three times a day (TID) | RESPIRATORY_TRACT | Status: DC
  Administered 2015-08-22: 3 mL via RESPIRATORY_TRACT
  Filled 2015-08-21: qty 3

## 2015-08-21 MED ORDER — HALOPERIDOL 5 MG PO TABS
5.0000 mg | ORAL_TABLET | Freq: Every day | ORAL | Status: DC
  Administered 2015-08-22 – 2015-08-27 (×7): 5 mg via ORAL
  Filled 2015-08-21 (×8): qty 1

## 2015-08-21 MED ORDER — ALBUTEROL SULFATE HFA 108 (90 BASE) MCG/ACT IN AERS: 2.0000 | INHALATION_SPRAY | Freq: Four times a day (QID) | RESPIRATORY_TRACT | Status: DC | PRN

## 2015-08-21 MED ORDER — LACTULOSE 10 GM/15ML PO SOLN
30.0000 g | Freq: Two times a day (BID) | ORAL | Status: DC
  Administered 2015-08-22 – 2015-08-28 (×8): 30 g via ORAL
  Filled 2015-08-21 (×13): qty 45

## 2015-08-21 MED ORDER — GUAIFENESIN 100 MG/5ML PO SOLN: 10.0000 mL | Freq: Four times a day (QID) | ORAL | Status: DC | PRN

## 2015-08-21 MED ORDER — ACETAMINOPHEN 325 MG PO TABS
650.0000 mg | ORAL_TABLET | Freq: Four times a day (QID) | ORAL | Status: DC | PRN
  Administered 2015-08-22 – 2015-08-26 (×6): 650 mg via ORAL
  Filled 2015-08-21 (×6): qty 2

## 2015-08-21 MED ORDER — GUAIFENESIN 100 MG/5ML PO SYRP
200.0000 mg | ORAL_SOLUTION | Freq: Four times a day (QID) | ORAL | Status: DC | PRN
  Filled 2015-08-21: qty 10

## 2015-08-21 MED ORDER — SODIUM CHLORIDE 0.9 % IV BOLUS (SEPSIS)
1000.0000 mL | Freq: Once | INTRAVENOUS | Status: AC
Start: 2015-08-21 — End: 2015-08-21
  Administered 2015-08-21: 1000 mL via INTRAVENOUS

## 2015-08-21 MED ORDER — SODIUM CHLORIDE 0.9 % IV BOLUS (SEPSIS): 1000.0000 mL | Freq: Once | INTRAVENOUS | Status: DC

## 2015-08-21 MED ORDER — DICYCLOMINE HCL 20 MG PO TABS
20.0000 mg | ORAL_TABLET | Freq: Two times a day (BID) | ORAL | Status: DC | PRN
  Filled 2015-08-21: qty 1

## 2015-08-21 MED ORDER — CLOZAPINE 100 MG PO TABS: 100.0000 mg | ORAL_TABLET | Freq: Three times a day (TID) | ORAL | Status: DC

## 2015-08-21 MED ORDER — VANCOMYCIN HCL IN DEXTROSE 750-5 MG/150ML-% IV SOLN
750.0000 mg | Freq: Two times a day (BID) | INTRAVENOUS | Status: DC
  Administered 2015-08-22 – 2015-08-24 (×5): 750 mg via INTRAVENOUS
  Filled 2015-08-21 (×10): qty 150

## 2015-08-21 MED ORDER — SODIUM CHLORIDE 0.9 % IV BOLUS (SEPSIS)
500.0000 mL | Freq: Once | INTRAVENOUS | Status: AC
  Administered 2015-08-21: 500 mL via INTRAVENOUS

## 2015-08-21 MED ORDER — TRIHEXYPHENIDYL HCL 5 MG PO TABS
5.0000 mg | ORAL_TABLET | Freq: Two times a day (BID) | ORAL | Status: DC
  Administered 2015-08-22 – 2015-08-28 (×12): 5 mg via ORAL
  Filled 2015-08-21 (×14): qty 1

## 2015-08-21 MED ORDER — NALOXONE HCL 0.4 MG/ML IJ SOLN
0.4000 mg | Freq: Once | INTRAMUSCULAR | Status: AC
  Administered 2015-08-21: 0.4 mg via INTRAVENOUS

## 2015-08-21 MED ORDER — METOPROLOL TARTRATE 25 MG PO TABS
25.0000 mg | ORAL_TABLET | Freq: Two times a day (BID) | ORAL | Status: DC
  Administered 2015-08-22 – 2015-08-28 (×13): 25 mg via ORAL
  Filled 2015-08-21 (×14): qty 1

## 2015-08-21 MED ORDER — ONDANSETRON HCL 4 MG/2ML IJ SOLN: 4.0000 mg | Freq: Four times a day (QID) | INTRAMUSCULAR | Status: DC | PRN

## 2015-08-21 MED ORDER — PANTOPRAZOLE SODIUM 40 MG PO TBEC
40.0000 mg | DELAYED_RELEASE_TABLET | Freq: Every day | ORAL | Status: DC
  Administered 2015-08-22 – 2015-08-28 (×6): 40 mg via ORAL
  Filled 2015-08-21 (×6): qty 1

## 2015-08-21 NOTE — ED Notes (Signed)
Pt is aware we need a urine sample. A urinal is left of the bed.

## 2015-08-21 NOTE — ED Provider Notes (Signed)
Medical screening examination/treatment/procedure(s) were conducted as a shared visit with non-physician practitioner(s) and myself.  I personally evaluated the patient during the encounter.   EKG Interpretation   Date/Time:  Thursday August 21 2015 15:40:50 EDT Ventricular Rate:  81 PR Interval:    QRS Duration: 88 QT Interval:  384 QTC Calculation: 446 R Axis:   61 Text Interpretation:  Sinus rhythm Confirmed by Rogene Houston  MD, [place]  [phone]) on 08/21/2015 4:09:19 PM Also confirmed by Rogene Houston  MD, [place]  [phone]), editor [place], Leda Gauze [phone])  on 08/21/2015 4:25:31 PM      Results for orders placed or performed during the hospital encounter of 08/21/15  CBC with Differential  Result Value Ref Range   WBC 11.9 (H) 4.0 - 10.5 K/uL   RBC 3.48 (L) 4.22 - 5.81 MIL/uL   Hemoglobin 9.1 (L) 13.0 - 17.0 g/dL   HCT 29.3 (L) 39.0 - 52.0 %   MCV 84.2 78.0 - 100.0 fL   MCH 26.1 26.0 - 34.0 pg   MCHC 31.1 30.0 - 36.0 g/dL   RDW 14.3 11.5 - 15.5 %   Platelets 242 150 - 400 K/uL   Neutrophils Relative % 82 %   Neutro Abs 9.8 (H) 1.7 - 7.7 K/uL   Lymphocytes Relative 13 %   Lymphs Abs 1.5 0.7 - 4.0 K/uL   Monocytes Relative 5 %   Monocytes Absolute 0.6 0.1 - 1.0 K/uL   Eosinophils Relative 0 %   Eosinophils Absolute 0.0 0.0 - 0.7 K/uL   Basophils Relative 0 %   Basophils Absolute 0.0 0.0 - 0.1 K/uL  Comprehensive metabolic panel  Result Value Ref Range   Sodium 141 135 - 145 mmol/L   Potassium 3.9 3.5 - 5.1 mmol/L   Chloride 110 101 - 111 mmol/L   CO2 25 22 - 32 mmol/L   Glucose, Bld 109 (H) 65 - 99 mg/dL   BUN 39 (H) 6 - 20 mg/dL   Creatinine, Ser 1.51 (H) 0.61 - 1.24 mg/dL   Calcium 7.8 (L) 8.9 - 10.3 mg/dL   Total Protein 5.3 (L) 6.5 - 8.1 g/dL   Albumin 2.0 (L) 3.5 - 5.0 g/dL   AST 24 15 - 41 U/L   ALT 21 17 - 63 U/L   Alkaline Phosphatase 48 38 - 126 U/L   Total Bilirubin 0.6 0.3 - 1.2 mg/dL   GFR calc non Af Amer 49 (L) >60 mL/min   GFR calc Af Amer 56 (L) >60 mL/min    Anion gap 6 5 - 15  Urinalysis, Routine w reflex microscopic (not at [facility])  Result Value Ref Range   Color, Urine AMBER (A) YELLOW   APPearance CLEAR CLEAR   Specific Gravity, Urine 1.022 1.005 - 1.030   pH 5.5 5.0 - 8.0   Glucose, UA NEGATIVE NEGATIVE mg/dL   Hgb urine dipstick NEGATIVE NEGATIVE   Bilirubin Urine NEGATIVE NEGATIVE   Ketones, ur NEGATIVE NEGATIVE mg/dL   Protein, ur NEGATIVE NEGATIVE mg/dL   Nitrite NEGATIVE NEGATIVE   Leukocytes, UA NEGATIVE NEGATIVE  CBG monitoring, ED  Result Value Ref Range   Glucose-Capillary 135 (H) 65 - 99 mg/dL  CBG monitoring, ED  Result Value Ref Range   Glucose-Capillary 131 (H) 65 - 99 mg/dL   Comment 1 Notify RN   I-Stat CG4 Lactic Acid, ED  Result Value Ref Range   Lactic Acid, Venous 1.21 0.5 - 1.9 mmol/L  I-stat troponin, ED  Result Value Ref Range  Troponin i, poc 0.01 0.00 - 0.08 ng/mL   Comment 3           Dg Chest Port 1 View  08/21/2015  CLINICAL DATA:  Pt arrives from [facility] via [place]. Pt was seen at [facility] yesterday for generalized weakness, transported out today for same. Pt was hypotensive on scene and remains that way upon arrival. Diaphoretic. EXAM: PORTABLE CHEST 1 VIEW COMPARISON:  04/24/2015 FINDINGS: Heart size is within normal limits. There is minimal density at the left lung base which may represent residual atelectasis or scarring. Overall the appearance is improved since the prior study. The right lung is clear. No pulmonary edema. IMPRESSION: 1. Minimal left lower lobe density, which may represent chronic atelectasis or infiltrate. 2. Is hard to entirely exclude a recurrent left lower lobe infectious process. Electronically Signed   By: Nolon Nations M.D.   On: 08/21/2015 16:15   Dg Foot Complete Right  08/21/2015  CLINICAL DATA:  RIGHT foot pain for an unknown period of time, unresponsive, diabetes mellitus, hypertension, peripheral vascular disease EXAM: RIGHT FOOT COMPLETE - 3+ VIEW COMPARISON:  04/02/2015  FINDINGS: Marked osseous demineralization. Joint spaces preserved. Dressing artifacts at the amputation bed from fifth toe amputation. Bone destruction of the fifth metatarsal head consistent with osteomyelitis. No fracture, dislocation, or additional bone destruction. Scattered soft tissue swelling RIGHT foot along the lateral and dorsal margins. IMPRESSION: Prior amputation of the fifth toe with destruction of the fifth metatarsal head consistent with osteomyelitis. Osseous demineralization. Electronically Signed   By: Lavonia Dana M.D.   On: 08/21/2015 17:12    CRITICAL CARE Performed by: Fredia Sorrow Total critical care time: 45 minutes Critical care time was exclusive of separately billable procedures and treating other patients. Critical care was necessary to treat or prevent imminent or life-threatening deterioration. Critical care was time spent personally by me on the following activities: development of treatment plan with patient and/or surrogate as well as nursing, discussions with consultants, evaluation of patient's response to treatment, examination of patient, obtaining history from patient or surrogate, ordering and performing treatments and interventions, ordering and review of laboratory studies, ordering and review of radiographic studies, pulse oximetry and re-evaluation of patient's condition.  Patient came in hypotensive but not tachycardic not febrile initially the thoughts and concerns were that may be he had too much narcotic on board. He was given the 0.4 mg of Narcan 2 with some improvement in his mental status but no significant sniffing improvement in his blood pressure. Blood pressure came up to around 85 systolic. Additional workup raises some concerns may be for a pre-septic picture. So patient was treated with broad-spectrum antibiotics and given IV fluids. That brought blood pressure up to 329 systolic. Patient's troponin was normal lactic acid ended up being normal.  Mild leukocytosis. Urinalysis still pending. Chest x-ray with questionable left lower lobe infiltrate.  Patient did have blood cultures done and was started on broad-spectrum antibiotics with blood pressures being now stabilized patient can be admitted by internal medicine. Patient is from [facility] care. Patient technically would be unassigned. On examination now patient is alert and will follow some commands. Patient does have evidence also of a chronic infection to his right foot and may very well and most likely has osteomyelitis there. But no evidence of any gas gangrene.  Fredia Sorrow, MD 08/21/15 2132

## 2015-08-21 NOTE — ED Notes (Signed)
Attempted to call report x 1 to 2 Heart.

## 2015-08-21 NOTE — H&P (Signed)
History and Physical    Craig Bentley ZOX:[id number] DOB: 1955/04/27 DOA: 08/21/2015  PCP: Craig Caras, NP  Patient coming from: Nursing home.  Chief Complaint: Confusion.  HPI: Craig Bentley is a 60 y.o. male with peripheral vascular disease status post aorto femoral bypass and right foot fifth toe amputation in March 2017, hypertension, diabetes mellitus, hyperlipidemia and COPD was brought to the ER, the patient was found to be confused at the living facility. Patient had come to the ER yesterday for foot pain and at that time also patient was found to be confused and patient's fentanyl patch was discontinued. Today in the ER patient was found to be hypotensive febrile with temperatures around 102F tachycardic mild leukocytosis with x-ray of the right foot shows features concerning for osteomyelitis. Patient was given 1 dose of Narcan in the ER following which patient became more alert awake. On exam patient has pinpoint pupils. By the time I examined patient patient has become more alert and awake. Follows commands. Does complain of pain in the right foot. Denies any chest pain or shortness of breath. Vascular surgeon Dr. Trula Slade has seen the patient in consult and patient will be admitted for further management of sepsis probably from osteomyelitis right foot.  ED Course: Fluid bolus was given in the ER and started on empiric antibiotics.  Review of Systems: As per HPI, rest all negative.   Past Medical History  Diagnosis Date  . Hypertension   . Diabetes mellitus   . Schizophrenia, schizo-affective ([place])   . COPD (chronic obstructive pulmonary disease) ([place])   . Constipation   . Fecal impaction ([place])   . Pneumonia   . Anxiety     Past Surgical History  Procedure Laterality Date  . Peripheral vascular catheterization N/A 04/07/2015    Procedure: Abdominal Aortogram;  Surgeon: Angelia Mould, MD;  Location: [facility];  Service: Cardiovascular;  Laterality: N/A;    . Aorta - bilateral femoral artery bypass graft Bilateral 04/23/2015    Procedure: AORTOBIFEMORAL BYPASS GRAFT;  Surgeon: Craig [place], MD;  Location: [facility];  Service: Vascular;  Laterality: Bilateral;  . Femoral-tibial bypass graft Right 04/23/2015    Procedure: Right Common Femoral to Posterior Tibial Bypass with Composite Vein Graft. ;  Surgeon: Craig [place], MD;  Location: [facility];  Service: Vascular;  Laterality: Right;  . Amputation Right 04/23/2015    Procedure: Right Fifth Toe Amputation;  Surgeon: Craig Dows, MD;  Location[facility];  Service: Vascular;  Laterality: Right;  . Femoral-femoral bypass graft Left 04/23/2015    Procedure:  Left Common Femoral Artery to Superficial Femoral Artery Bypass Graft with Vein;  Surgeon: Craig [place], MD;  Location: [facility];  Service: Vascular;  Laterality: Left;     reports that he has been smoking Cigarettes.  He has a 75 pack-year smoking history. He has never used smokeless tobacco. He reports that he does not drink alcohol or use illicit drugs.  No Known Allergies  Family History  Problem Relation Age of Onset  . Mental illness Mother     Prior to Admission medications   Medication Sig Start Date End Date Taking? Authorizing Provider  acetaminophen (TYLENOL) 325 MG tablet Take 650 mg by mouth every 6 (six) hours as needed for mild pain, fever or headache.    Yes Historical Provider, MD  albuterol (PROAIR HFA) 108 (90 Base) MCG/ACT inhaler Inhale 2 puffs into the lungs every 6 (six) hours as needed for shortness  of breath (cough).   Yes Historical Provider, MD  albuterol (PROVENTIL) (2.5 MG/3ML) 0.083% nebulizer solution Take 3 mLs (2.5 mg total) by nebulization every 4 (four) hours as needed for wheezing. 02/19/15  Yes Craig Blaze, MD  cloZAPine (CLOZARIL) 100 MG tablet Take 100-400 mg by mouth 3 (three) times daily. Take 1 tablet (100 mg) by mouth daily at 8am and 4pm, take 4 tablets (400 mg) at bedtime   Yes Historical Provider, MD  dicyclomine  (BENTYL) 20 MG tablet Take 20 mg by mouth 2 (two) times daily as needed for spasms.   Yes Historical Provider, MD  donepezil (ARICEPT) 5 MG tablet Take 5 mg by mouth at bedtime.   Yes Historical Provider, MD  doxycycline (VIBRA-TABS) 100 MG tablet Take 100 mg by mouth 2 (two) times daily.   Yes Historical Provider, MD  gabapentin (NEURONTIN) 300 MG capsule Take 300 mg by mouth 3 (three) times daily.   Yes Historical Provider, MD  guaifenesin (ROBITUSSIN) 100 MG/5ML syrup Take 200 mg by mouth 4 (four) times daily as needed for cough.   Yes Historical Provider, MD  haloperidol (HALDOL) 5 MG tablet Take 5 mg by mouth at bedtime.   Yes Historical Provider, MD  hydrochlorothiazide (HYDRODIURIL) 25 MG tablet Take 25 mg by mouth daily.   Yes Historical Provider, MD  ibuprofen (ADVIL,MOTRIN) 200 MG tablet Take 400 mg by mouth 2 (two) times daily as needed (pain).    Yes Historical Provider, MD  ipratropium-albuterol (DUONEB) 0.5-2.5 (3) MG/3ML SOLN Take 3 mLs by nebulization 3 (three) times daily. 02/19/15  Yes Craig Blaze, MD  lactulose (CHRONULAC) 10 GM/15ML solution Take 30 g by mouth 2 (two) times daily.   Yes Historical Provider, MD  lisinopril (PRINIVIL,ZESTRIL) 20 MG tablet Take 20 mg by mouth daily.   Yes Historical Provider, MD  metFORMIN (GLUCOPHAGE) 500 MG tablet Take 500 mg by mouth 2 (two) times daily with a meal.   Yes Historical Provider, MD  metoprolol tartrate (LOPRESSOR) 25 MG tablet Take 25 mg by mouth 2 (two) times daily.   Yes Historical Provider, MD  Multiple Vitamin (MULITIVITAMIN WITH MINERALS) TABS Take 1 tablet by mouth daily.   Yes Historical Provider, MD  pantoprazole (PROTONIX) 40 MG tablet Take 1 tablet (40 mg total) by mouth daily. 02/20/15  Yes Craig Blaze, MD  polyethylene glycol [facility]) packet Take 17 g by mouth daily as needed (constipation). Mix in 8 oz liquid and drink   Yes Historical Provider, MD  senna (SENOKOT) 8.6 MG TABS Take 1 tablet by mouth 2  (two) times daily.   Yes Historical Provider, MD  senna-docusate (SENOKOT-S) 8.6-50 MG tablet Take 1 tablet by mouth at bedtime as needed for mild constipation. 05/01/15  Yes Alvia Grove, PA-C  simvastatin (ZOCOR) 5 MG tablet Take 5 mg by mouth at bedtime.   Yes Historical Provider, MD  sulfamethoxazole-trimethoprim (BACTRIM DS,SEPTRA DS) 800-160 MG tablet Take 1 tablet by mouth 2 (two) times daily. 08/17/15 08/24/15 Yes Isla Pence, MD  trihexyphenidyl (ARTANE) 5 MG tablet Take 5 mg by mouth 2 (two) times daily with a meal.   Yes Historical Provider, MD  aspirin EC 81 MG tablet Take 1 tablet (81 mg total) by mouth daily. Patient not taking: Reported on 08/20/2015 05/01/15   Alvia Grove, PA-C  fentaNYL (DURAGESIC) 50 MCG/HR Place 1 patch (50 mcg total) onto the skin every 3 (three) days. Patient not taking: Reported on 08/20/2015 08/17/15  Isla Pence, MD  oxyCODONE-acetaminophen (PERCOCET/ROXICET) 5-325 MG tablet Take 1 tablet by mouth every 4 (four) hours as needed for moderate pain. Patient not taking: Reported on 08/20/2015 05/01/15   Alvia Grove, PA-C    Physical Exam: Filed Vitals:   08/21/15 2130 08/21/15 2145 08/21/15 2221 08/21/15 2242  BP: 123/57 127/65    Pulse: 83 86    Temp:   97.5 F (36.4 C) 102.5 F (39.2 C)  TempSrc:   Oral Oral  Resp: 16 18    Weight:      SpO2: 99% 98%        Constitutional: Not in distress. Filed Vitals:   08/21/15 2130 08/21/15 2145 08/21/15 2221 08/21/15 2242  BP: 123/57 127/65    Pulse: 83 86    Temp:   97.5 F (36.4 C) 102.5 F (39.2 C)  TempSrc:   Oral Oral  Resp: 16 18    Weight:      SpO2: 99% 98%     Eyes: Anicteric no pallor. ENMT: No discharge from the ears eyes nose and mouth. Neck: No mass felt. No JVD appreciated. Respiratory: No rhonchi or crepitations. Cardiovascular: S1 and S2 heard. Abdomen: Soft nontender bowel sounds present. Musculoskeletal: Right foot has amputated fifth toe. There is an ulceration on  the dorsum of the foot. No active discharge. Skin: See musculoskeletal section. Neurologic: Alert awake oriented to his name and place. Moves all extremities. Psychiatric: Alert awake oriented to his name and place.   Labs on Admission: I have personally reviewed following labs and imaging studies  CBC:  Recent Labs Lab 08/20/15 1418 08/21/15 1735  WBC 14.7* 11.9*  NEUTROABS  --  9.8*  HGB 9.2* 9.1*  HCT 28.9* 29.3*  MCV 83.8 84.2  PLT 283 254   Basic Metabolic Panel:  Recent Labs Lab 08/20/15 1418 08/21/15 1735  NA 138 141  K 4.3 3.9  CL 101 110  CO2 28 25  GLUCOSE 90 109*  BUN 37* 39*  CREATININE 1.08 1.51*  CALCIUM 9.2 7.8*   GFR: Estimated Creatinine Clearance: 56.3 mL/min (by C-G formula based on Cr of 1.51). Liver Function Tests:  Recent Labs Lab 08/21/15 1735  AST 24  ALT 21  ALKPHOS 48  BILITOT 0.6  PROT 5.3*  ALBUMIN 2.0*   No results for input(s): LIPASE, AMYLASE in the last 168 hours. No results for input(s): AMMONIA in the last 168 hours. Coagulation Profile: No results for input(s): INR, PROTIME in the last 168 hours. Cardiac Enzymes: No results for input(s): CKTOTAL, CKMB, CKMBINDEX, TROPONINI in the last 168 hours. BNP (last 3 results) No results for input(s): PROBNP in the last 8760 hours. HbA1C: No results for input(s): HGBA1C in the last 72 hours. CBG:  Recent Labs Lab 08/20/15 1456 08/21/15 1543 08/21/15 1717  GLUCAP 96 135* 131*   Lipid Profile: No results for input(s): CHOL, HDL, LDLCALC, TRIG, CHOLHDL, LDLDIRECT in the last 72 hours. Thyroid Function Tests: No results for input(s): TSH, T4TOTAL, FREET4, T3FREE, THYROIDAB in the last 72 hours. Anemia Panel: No results for input(s): VITAMINB12, FOLATE, FERRITIN, TIBC, IRON, RETICCTPCT in the last 72 hours. Urine analysis:    Component Value Date/Time   COLORURINE AMBER* 08/21/2015 [place] 08/21/2015 1854   LABSPEC 1.022 08/21/2015 1854   PHURINE 5.5  08/21/2015 [place] 08/21/2015 1854   HGBUR NEGATIVE 08/21/2015 [place] NEGATIVE 08/21/2015 [place] NEGATIVE 08/21/2015 [place] NEGATIVE 08/21/2015  [place] 1.0 09/16/2013 2243   NITRITE NEGATIVE 08/21/2015 [place] 08/21/2015 1854   Sepsis Labs: '@LABRCNTIP'$ (procalcitonin:4,lacticidven:4) ) Recent Results (from the past 240 hour(s))  Wound or Superficial Culture     Status: None   Collection Time: 08/17/15  9:23 AM  Result Value Ref Range Status   Specimen Description WOUND FOOT RIGHT  Final   Special Requests Normal  Final   Gram Stain   Final    FEW WBC PRESENT, PREDOMINANTLY PMN RARE GRAM POSITIVE COCCOBACILLUS Performed at [facility]    Culture   Final    MODERATE METHICILLIN RESISTANT STAPHYLOCOCCUS AUREUS   Report Status 08/20/2015 FINAL  Final   Organism ID, Bacteria METHICILLIN RESISTANT STAPHYLOCOCCUS AUREUS  Final      Susceptibility   Methicillin resistant staphylococcus aureus - MIC*    CIPROFLOXACIN >=8 RESISTANT Resistant     ERYTHROMYCIN <=0.25 SENSITIVE Sensitive     GENTAMICIN <=0.5 SENSITIVE Sensitive     OXACILLIN >=4 RESISTANT Resistant     TETRACYCLINE 2 SENSITIVE Sensitive     VANCOMYCIN 1 SENSITIVE Sensitive     TRIMETH/SULFA <=10 SENSITIVE Sensitive     CLINDAMYCIN <=0.25 SENSITIVE Sensitive     RIFAMPIN <=0.5 SENSITIVE Sensitive     Inducible Clindamycin NEGATIVE Sensitive     * MODERATE METHICILLIN RESISTANT STAPHYLOCOCCUS AUREUS     Radiological Exams on Admission: Dg Chest Port 1 View  08/21/2015  CLINICAL DATA:  Pt arrives from [facility] via [place]. Pt was seen at [facility] yesterday for generalized weakness, transported out today for same. Pt was hypotensive on scene and remains that way upon arrival. Diaphoretic. EXAM: PORTABLE CHEST 1 VIEW COMPARISON:  04/24/2015 FINDINGS: Heart size is within normal limits. There is minimal density at the left lung base which may  represent residual atelectasis or scarring. Overall the appearance is improved since the prior study. The right lung is clear. No pulmonary edema. IMPRESSION: 1. Minimal left lower lobe density, which may represent chronic atelectasis or infiltrate. 2. Is hard to entirely exclude a recurrent left lower lobe infectious process. Electronically Signed   By: Nolon Nations M.D.   On: 08/21/2015 16:15   Dg Foot Complete Right  08/21/2015  CLINICAL DATA:  RIGHT foot pain for an unknown period of time, unresponsive, diabetes mellitus, hypertension, peripheral vascular disease EXAM: RIGHT FOOT COMPLETE - 3+ VIEW COMPARISON:  04/02/2015 FINDINGS: Marked osseous demineralization. Joint spaces preserved. Dressing artifacts at the amputation bed from fifth toe amputation. Bone destruction of the fifth metatarsal head consistent with osteomyelitis. No fracture, dislocation, or additional bone destruction. Scattered soft tissue swelling RIGHT foot along the lateral and dorsal margins. IMPRESSION: Prior amputation of the fifth toe with destruction of the fifth metatarsal head consistent with osteomyelitis. Osseous demineralization. Electronically Signed   By: Lavonia Dana M.D.   On: 08/21/2015 17:12     Assessment/Plan Principal Problem:   Sepsis ([place]) Active Problems:   Essential hypertension   COPD (chronic obstructive pulmonary disease) ([facility])   Diabetes mellitus with neurologic complication, without long-term current use of insulin ([facility])   PVD (peripheral vascular disease) ([place])   Schizophrenia ([place])   ARF (acute renal failure) ([facility])   Acute encephalopathy    1. Sepsis from osteomyelitis of the right foot - appreciate vascular surgery consult. Patient has been placed on vancomycin and Zosyn. Further recommendations per vascular surgery. On admission patient had tachycardia fever hypotension and fits and further criteria for sepsis. Blood pressure improved  with fluid bolus. 2. Acute encephalopathy -  possibly related to pain medications. I'm holding off patient's gabapentin for now. Patient's fentanyl was discontinued 2 days ago. CT head is unremarkable. Patient is presently following commands. Closely observe. Check ammonia levels. 3. Acute renal failure probably from hypotension and patient being on ACE inhibitor - hold off ACE inhibitor and hydrochlorothiazide and continue hydration. Follow metabolic panel closely. 4. Hypertension - since patient was hypotensive on presentation will hold antihypertensives. Will continue beta blockers. When necessary IV hydralazine for systolic blood pressure more than 160. 5. Diabetes mellitus type 2 - hold metformin while inpatient and keep patient on sliding scale coverage. 6. History of schizophrenia - on Haldol clozapine and trihexyphenidyl. 7. COPD - presently not wheezing continue inhalers. 8. Peripheral vascular disease status post aortofemoral bypass - continue aspirin and statins.   DVT prophylaxis: SCDs in anticipation of procedure. Code Status: Full code.  Family Communication: No family at the bedside.  Disposition Plan: Nursing home.  Consults called: Vascular surgery.  Admission status: Inpatient. A stent done.    Rise Patience MD [facility] Pager [phone].  If 7PM-7AM, please contact night-coverage [email or web address] Password [facility]  08/21/2015, 11:06 PM

## 2015-08-21 NOTE — ED Notes (Signed)
Phlebotomy at bedside at this time.

## 2015-08-21 NOTE — ED Provider Notes (Signed)
CSN: [id number]     Arrival date & time 08/21/15  1531 History   First MD Initiated Contact with Patient 08/21/15 1533     Chief Complaint  Patient presents with  . Fatigue   HPI Comments: 60 year old male who presents with AMS. PMH significant for HTN, COPD, schizophrenia, anxiety, and a non-healing right foot wound due to arterial disease. He was seen at [facility] yesterday for a wound check. He was lethargic at that time and found to have 2 Fentanyl patches on. After they were removed his mental status was improved and he was sent back to his ALF [facility]). He had a leukocytosis of 14.7 and stable anemia. He has an appt with Dr. Bridgett Larsson tomorrow to discuss amputation. History is limited today as patient is very lethargic. He does not appear to have any Fentanyl patches on. He is also currently on Bactrim for his wound infection.   Past Medical History  Diagnosis Date  . Hypertension   . Diabetes mellitus   . Schizophrenia, schizo-affective ([place])   . COPD (chronic obstructive pulmonary disease) ([place])   . Constipation   . Fecal impaction ([place])   . Pneumonia   . Anxiety    Past Surgical History  Procedure Laterality Date  . Peripheral vascular catheterization N/A 04/07/2015    Procedure: Abdominal Aortogram;  Surgeon: Angelia Mould, MD;  Location: [place] [facility];  Service: Cardiovascular;  Laterality: N/A;  . Aorta - bilateral femoral artery bypass graft Bilateral 04/23/2015    Procedure: AORTOBIFEMORAL BYPASS GRAFT;  Surgeon: Conrad Shirleysburg, MD;  Location: [place];  Service: Vascular;  Laterality: Bilateral;  . Femoral-tibial bypass graft Right 04/23/2015    Procedure: Right Common Femoral to Posterior Tibial Bypass with Composite Vein Graft. ;  Surgeon: Conrad Waushara, MD;  Location: [place];  Service: Vascular;  Laterality: Right;  . Amputation Right 04/23/2015    Procedure: Right Fifth Toe Amputation;  Surgeon: Conrad Rapides, MD;  Location: [facility] OR;  Service: Vascular;  Laterality: Right;  .  Femoral-femoral bypass graft Left 04/23/2015    Procedure:  Left Common Femoral Artery to Superficial Femoral Artery Bypass Graft with Vein;  Surgeon: Conrad , MD;  Location: [facility] OR;  Service: Vascular;  Laterality: Left;   Family History  Problem Relation Age of Onset  . Mental illness Mother    Social History  Substance Use Topics  . Smoking status: Current Every Day Smoker -- 1.50 packs/day for 50 years    Types: Cigarettes  . Smokeless tobacco: Never Used  . Alcohol Use: No    Review of Systems  Unable to perform ROS: Mental status change      Allergies  Review of patient's allergies indicates no known allergies.  Home Medications   Prior to Admission medications   Medication Sig Start Date End Date Taking? Authorizing Provider  acetaminophen (TYLENOL) 325 MG tablet Take 650 mg by mouth every 6 (six) hours as needed for mild pain, fever or headache.     Historical Provider, MD  albuterol (PROVENTIL HFA;VENTOLIN HFA) 108 (90 BASE) MCG/ACT inhaler Inhale 2 puffs into the lungs every 4 (four) hours as needed for wheezing or shortness of breath. Reported on 05/16/2015    Historical Provider, MD  albuterol (PROVENTIL) (2.5 MG/3ML) 0.083% nebulizer solution Take 3 mLs (2.5 mg total) by nebulization every 4 (four) hours as needed for wheezing. 02/19/15   Theodis Blaze, MD  aspirin EC 81 MG tablet Take 1  tablet (81 mg total) by mouth daily. Patient not taking: Reported on 08/20/2015 05/01/15   Alvia Grove, PA-C  cephALEXin (KEFLEX) 500 MG capsule Take 500 mg by mouth every 6 (six) hours. Reported on 08/20/2015    Historical Provider, MD  cloZAPine (CLOZARIL) 100 MG tablet Take 100-400 mg by mouth 3 (three) times daily. Takes 1 tablet every morning and afternoon Takes 4 tablets at bedtime    Historical Provider, MD  dicyclomine (BENTYL) 20 MG tablet Take 20 mg by mouth 2 (two) times daily as needed for spasms.    Historical Provider, MD  donepezil (ARICEPT) 5 MG tablet Take 5 mg by  mouth at bedtime.    Historical Provider, MD  doxycycline (VIBRA-TABS) 100 MG tablet Take 100 mg by mouth 2 (two) times daily.    Historical Provider, MD  fentaNYL (DURAGESIC) 50 MCG/HR Place 1 patch (50 mcg total) onto the skin every 3 (three) days. Patient not taking: Reported on 08/20/2015 08/17/15   Isla Pence, MD  gabapentin (NEURONTIN) 300 MG capsule Take 300 mg by mouth 3 (three) times daily.    Historical Provider, MD  guaifenesin (ROBITUSSIN) 100 MG/5ML syrup Take 200 mg by mouth 4 (four) times daily as needed for cough.    Historical Provider, MD  haloperidol (HALDOL) 10 MG tablet Take 10 mg by mouth at bedtime.    Historical Provider, MD  haloperidol (HALDOL) 5 MG tablet Take 5 mg by mouth at bedtime.    Historical Provider, MD  hydrochlorothiazide (HYDRODIURIL) 25 MG tablet Take 25 mg by mouth daily.    Historical Provider, MD  ibuprofen (ADVIL,MOTRIN) 200 MG tablet Take 400 mg by mouth 2 (two) times daily as needed for moderate pain.    Historical Provider, MD  ipratropium-albuterol (DUONEB) 0.5-2.5 (3) MG/3ML SOLN Take 3 mLs by nebulization 3 (three) times daily. 02/19/15   Theodis Blaze, MD  lactulose (CHRONULAC) 10 GM/15ML solution Take 30 g by mouth 2 (two) times daily.    Historical Provider, MD  lisinopril (PRINIVIL,ZESTRIL) 20 MG tablet Take 20 mg by mouth daily.    Historical Provider, MD  metFORMIN (GLUCOPHAGE) 500 MG tablet Take 500 mg by mouth 2 (two) times daily with a meal.    Historical Provider, MD  metoprolol tartrate (LOPRESSOR) 25 MG tablet Take 25 mg by mouth 2 (two) times daily.    Historical Provider, MD  Multiple Vitamin (MULITIVITAMIN WITH MINERALS) TABS Take 1 tablet by mouth daily.    Historical Provider, MD  oxyCODONE-acetaminophen (PERCOCET/ROXICET) 5-325 MG tablet Take 1 tablet by mouth every 4 (four) hours as needed for moderate pain. Patient not taking: Reported on 08/20/2015 05/01/15   Alvia Grove, PA-C  pantoprazole (PROTONIX) 40 MG tablet Take 1  tablet (40 mg total) by mouth daily. 02/20/15   Theodis Blaze, MD  polyethylene glycol (MIRALAX / Floria Raveling) packet Take 17 g by mouth daily as needed for mild constipation or moderate constipation.    Historical Provider, MD  senna (SENOKOT) 8.6 MG TABS Take 1 tablet by mouth 2 (two) times daily.    Historical Provider, MD  senna-docusate (SENOKOT-S) 8.6-50 MG tablet Take 1 tablet by mouth at bedtime as needed for mild constipation. 05/01/15   Alvia Grove, PA-C  simvastatin (ZOCOR) 5 MG tablet Take 5 mg by mouth at bedtime.    Historical Provider, MD  sulfamethoxazole-trimethoprim (BACTRIM DS,SEPTRA DS) 800-160 MG tablet Take 1 tablet by mouth 2 (two) times daily. 08/17/15 08/24/15  Isla Pence, MD  trihexyphenidyl (ARTANE) 5 MG tablet Take 5 mg by mouth 2 (two) times daily with a meal.    Historical Provider, MD   BP 83/53 mmHg  Pulse 86  Temp(Src) 99.5 F (37.5 C) (Rectal)  Resp 17  Wt 79.379 kg  SpO2 94%   Physical Exam  Constitutional: He is oriented to person, place, and time. He appears well-developed and well-nourished. No distress.  Appears older than stated age  HENT:  Head: Normocephalic and atraumatic.  Eyes: Conjunctivae are normal. Right eye exhibits no discharge. Left eye exhibits no discharge. No scleral icterus.  Pinpoint pupils  Neck: Normal range of motion.  Cardiovascular: Normal rate, regular rhythm and intact distal pulses.  Exam reveals no gallop and no friction rub.   No murmur heard. Pulmonary/Chest: Effort normal and breath sounds normal. No respiratory distress. He has no wheezes. He has no rales. He exhibits no tenderness.  Abdominal: Soft. Bowel sounds are normal. He exhibits no distension and no mass. There is no tenderness. There is no rebound and no guarding.  Musculoskeletal:  3x2 cm ulceration on dorsum of foot. 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 See photo. 1+ pulse on Right foot.  Neurological: He is alert and oriented to person, place, and  time.  Skin: Skin is warm and dry. He is not diaphoretic.  Psychiatric: He has a normal mood and affect.        ED Course  Procedures (including critical care time) Labs Review Labs Reviewed  CBC WITH DIFFERENTIAL/PLATELET - Abnormal; Notable for the following:    WBC 11.9 (*)    RBC 3.48 (*)    Hemoglobin 9.1 (*)    HCT 29.3 (*)    Neutro Abs 9.8 (*)    All other components within normal limits  COMPREHENSIVE METABOLIC PANEL - Abnormal; Notable for the following:    Glucose, Bld 109 (*)    BUN 39 (*)    Creatinine, Ser 1.51 (*)    Calcium 7.8 (*)    Total Protein 5.3 (*)    Albumin 2.0 (*)    GFR calc non Af Amer 49 (*)    GFR calc Af Amer 56 (*)    All other components within normal limits  URINALYSIS, ROUTINE W REFLEX MICROSCOPIC (NOT AT [facility]) - Abnormal; Notable for the following:    Color, Urine AMBER (*)    All other components within normal limits  CBG MONITORING, ED - Abnormal; Notable for the following:    Glucose-Capillary 135 (*)    All other components within normal limits  CBG MONITORING, ED - Abnormal; Notable for the following:    Glucose-Capillary 131 (*)    All other components within normal limits  CULTURE, BLOOD (ROUTINE X 2)  CULTURE, BLOOD (ROUTINE X 2)  URINE CULTURE  I-STAT CG4 LACTIC ACID, ED  Randolm Idol, ED    Imaging Review Dg Chest Port 1 View  08/21/2015  CLINICAL DATA:  Pt arrives from [facility] via [place]. Pt was seen at [facility] yesterday for generalized weakness, transported out today for same. Pt was hypotensive on scene and remains that way upon arrival. Diaphoretic. EXAM: PORTABLE CHEST 1 VIEW COMPARISON:  04/24/2015 FINDINGS: Heart size is within normal limits. There is minimal density at the left lung base which may represent residual atelectasis or scarring. Overall the appearance is improved since the prior study. The right lung is clear. No pulmonary edema. IMPRESSION: 1. Minimal left lower lobe density, which may represent  chronic atelectasis or infiltrate.  2. Is hard to entirely exclude a recurrent left lower lobe infectious process. Electronically Signed   By: Nolon Nations M.D.   On: 08/21/2015 16:15   Dg Foot Complete Right  08/21/2015  CLINICAL DATA:  RIGHT foot pain for an unknown period of time, unresponsive, diabetes mellitus, hypertension, peripheral vascular disease EXAM: RIGHT FOOT COMPLETE - 3+ VIEW COMPARISON:  04/02/2015 FINDINGS: Marked osseous demineralization. Joint spaces preserved. Dressing artifacts at the amputation bed from fifth toe amputation. Bone destruction of the fifth metatarsal head consistent with osteomyelitis. No fracture, dislocation, or additional bone destruction. Scattered soft tissue swelling RIGHT foot along the lateral and dorsal margins. IMPRESSION: Prior amputation of the fifth toe with destruction of the fifth metatarsal head consistent with osteomyelitis. Osseous demineralization. Electronically Signed   By: Lavonia Dana M.D.   On: 08/21/2015 17:12   I have personally reviewed and evaluated these images and lab results as part of my medical decision-making.   EKG Interpretation   Date/Time:  Thursday August 21 2015 15:40:50 EDT Ventricular Rate:  81 PR Interval:    QRS Duration: 88 QT Interval:  384 QTC Calculation: 446 R Axis:   61 Text Interpretation:  Sinus rhythm Confirmed by Rogene Houston  MD, SCOTT  [phone]) on 08/21/2015 4:09:19 PM Also confirmed by Rogene Houston  MD, SCOTT  [phone]), editor Lorenda Cahill CT, Leda Gauze [phone])  on 08/21/2015 4:25:31 PM     CRITICAL CARE Performed by: Recardo Evangelist   Total critical care time: 30 minutes  Critical care time was exclusive of separately billable procedures and treating other patients.  Critical care was necessary to treat or prevent imminent or life-threatening deterioration.  Critical care was time spent personally by me on the following activities: development of treatment plan with patient and/or surrogate as well as  nursing, discussions with consultants, evaluation of patient's response to treatment, examination of patient, obtaining history from patient or surrogate, ordering and performing treatments and interventions, ordering and review of laboratory studies, ordering and review of radiographic studies, pulse oximetry and re-evaluation of patient's condition.  MDM   Final diagnoses:  Hypotension, unspecified hypotension type  Osteomyelitis of ankle or foot, acute, right (HCC)  Sepsis, due to unspecified organism [facility])   60 year old male who presents with AMS and hypotension. Pt was seen yesterday with similar symptoms however no Fentanyl patches found on him today and he was not hypotensive yesterday. Narcan and fluids given with some improvement in mental status. His initial BP were in the 80s/40s and with 3.5 L of IVF they have improved to 120s/60s. Code Sepsis called due to hypotension and high respiratory rate with known source of infection. All other vitals are otherwise normal - he is afebrile, not tachycardic, and not hypoxic.   CBC remarkable for leukocytosis of 11.9 which is improved from yesterday. Anemia is stable. CMP remarkable for worsening kidney function. BUN/SCr today is 39/1.51 up from 37/1.08 yesterday. Lactic acid is normal. Troponin is normal. EKG is NSR, CXR shows left lower lobe density which is unclear if this is PNA vs chronic process. UA clean. Blood cultures drawn. Xray of foot shows osteomyelitis  BP and mental status are significantly improved after Narcan and IVF boluses. Pt will be admitted to [facility]. Spoke with Vascular who will see patient in consult tomorrow.      Recardo Evangelist, PA-C 08/21/15 2208

## 2015-08-21 NOTE — Consult Note (Signed)
Consult Note  Patient name: Craig Bentley MRN: [id number] DOB: 04-09-1955 Sex: male  Consulting Physician:  ER( Dr. Alvino Chapel)  Reason for Consult:  Chief Complaint  Patient presents with  . Fatigue    HISTORY OF PRESENT ILLNESS: This is a 60 year old male who is s/p aortobifemoral bypass, right femoral to posterior tibial artery bypass with composite graft, left femoral to SFA bypass with GSV and right 5th toe amputation on 04-25-2015 by Dr. Bridgett Larsson.  He presented to the ER tonight with sepsis and a non-healing right foot wound.  The patient is lethargic, felt to be related to fentanyl patches and mildly hypotensive which is responsive to IV fluid.  He will be admitted by the hospital service.  He is on Oral abx (Bactrim)  He is medically managed for hypertension with an ACE-I.  He takes a statin for hypercholesterolemia.  He is a current smoker.  He suffers from COPD and schizophrenia.  He resides at a ALF [facility]).  He is a diabetic.  His last A1c was 6.3 on 04-28-2015.  Past Medical History  Diagnosis Date  . Hypertension   . Diabetes mellitus   . Schizophrenia, schizo-affective ([place])   . COPD (chronic obstructive pulmonary disease) ([place])   . Constipation   . Fecal impaction ([place])   . Pneumonia   . Anxiety     Past Surgical History  Procedure Laterality Date  . Peripheral vascular catheterization N/A 04/07/2015    Procedure: Abdominal Aortogram;  Surgeon: Angelia Mould, MD;  Location: [place] [facility];  Service: Cardiovascular;  Laterality: N/A;  . Aorta - bilateral femoral artery bypass graft Bilateral 04/23/2015    Procedure: AORTOBIFEMORAL BYPASS GRAFT;  Surgeon: Conrad Cordes Lakes, MD;  Location: [place];  Service: Vascular;  Laterality: Bilateral;  . Femoral-tibial bypass graft Right 04/23/2015    Procedure: Right Common Femoral to Posterior Tibial Bypass with Composite Vein Graft. ;  Surgeon: Conrad Winchester, MD;  Location: [place];  Service: Vascular;  Laterality: Right;    . Amputation Right 04/23/2015    Procedure: Right Fifth Toe Amputation;  Surgeon: Conrad Reidland, MD;  Location: [facility];  Service: Vascular;  Laterality: Right;  . Femoral-femoral bypass graft Left 04/23/2015    Procedure:  Left Common Femoral Artery to Superficial Femoral Artery Bypass Graft with Vein;  Surgeon: Conrad Foot of Ten, MD;  Location: [facility] OR;  Service: Vascular;  Laterality: Left;    Social History   Social History  . Marital Status: Single    Spouse Name: N/A  . Number of Children: N/A  . Years of Education: N/A   Occupational History  . Not on file.   Social History Main Topics  . Smoking status: Current Every Day Smoker -- 1.50 packs/day for 50 years    Types: Cigarettes  . Smokeless tobacco: Never Used  . Alcohol Use: No  . Drug Use: No  . Sexual Activity: No   Other Topics Concern  . Not on file   Social History Narrative    Family History  Problem Relation Age of Onset  . Mental illness Mother     Allergies as of 08/21/2015  . (No Known Allergies)    No current facility-administered medications on file prior to encounter.   Current Outpatient Prescriptions on File Prior to Encounter  Medication Sig Dispense Refill  . acetaminophen (TYLENOL) 325 MG tablet Take 650 mg by mouth every 6 (six) hours as needed  for mild pain, fever or headache.     . albuterol (PROVENTIL) (2.5 MG/3ML) 0.083% nebulizer solution Take 3 mLs (2.5 mg total) by nebulization every 4 (four) hours as needed for wheezing. 75 mL 12  . cloZAPine (CLOZARIL) 100 MG tablet Take 100-400 mg by mouth 3 (three) times daily. Take 1 tablet (100 mg) by mouth daily at 8am and 4pm, take 4 tablets (400 mg) at bedtime    . dicyclomine (BENTYL) 20 MG tablet Take 20 mg by mouth 2 (two) times daily as needed for spasms.    Marland Kitchen donepezil (ARICEPT) 5 MG tablet Take 5 mg by mouth at bedtime.    Marland Kitchen doxycycline (VIBRA-TABS) 100 MG tablet Take 100 mg by mouth 2 (two) times daily.    Marland Kitchen gabapentin (NEURONTIN) 300 MG  capsule Take 300 mg by mouth 3 (three) times daily.    Marland Kitchen guaifenesin (ROBITUSSIN) 100 MG/5ML syrup Take 200 mg by mouth 4 (four) times daily as needed for cough.    . haloperidol (HALDOL) 5 MG tablet Take 5 mg by mouth at bedtime.    . hydrochlorothiazide (HYDRODIURIL) 25 MG tablet Take 25 mg by mouth daily.    Marland Kitchen ibuprofen (ADVIL,MOTRIN) 200 MG tablet Take 400 mg by mouth 2 (two) times daily as needed (pain).     Marland Kitchen ipratropium-albuterol (DUONEB) 0.5-2.5 (3) MG/3ML SOLN Take 3 mLs by nebulization 3 (three) times daily. 360 mL 1  . lactulose (CHRONULAC) 10 GM/15ML solution Take 30 g by mouth 2 (two) times daily.    Marland Kitchen lisinopril (PRINIVIL,ZESTRIL) 20 MG tablet Take 20 mg by mouth daily.    . metFORMIN (GLUCOPHAGE) 500 MG tablet Take 500 mg by mouth 2 (two) times daily with a meal.    . metoprolol tartrate (LOPRESSOR) 25 MG tablet Take 25 mg by mouth 2 (two) times daily.    . Multiple Vitamin (MULITIVITAMIN WITH MINERALS) TABS Take 1 tablet by mouth daily.    . pantoprazole (PROTONIX) 40 MG tablet Take 1 tablet (40 mg total) by mouth daily. 30 tablet 1  . polyethylene glycol (MIRALAX / GLYCOLAX) packet Take 17 g by mouth daily as needed (constipation). Mix in 8 oz liquid and drink    . senna (SENOKOT) 8.6 MG TABS Take 1 tablet by mouth 2 (two) times daily.    Marland Kitchen senna-docusate (SENOKOT-S) 8.6-50 MG tablet Take 1 tablet by mouth at bedtime as needed for mild constipation. 30 tablet 1  . simvastatin (ZOCOR) 5 MG tablet Take 5 mg by mouth at bedtime.    . sulfamethoxazole-trimethoprim (BACTRIM DS,SEPTRA DS) 800-160 MG tablet Take 1 tablet by mouth 2 (two) times daily. 14 tablet 0  . trihexyphenidyl (ARTANE) 5 MG tablet Take 5 mg by mouth 2 (two) times daily with a meal.    . aspirin EC 81 MG tablet Take 1 tablet (81 mg total) by mouth daily. (Patient not taking: Reported on 08/20/2015) 30 tablet 0  . fentaNYL (DURAGESIC) 50 MCG/HR Place 1 patch (50 mcg total) onto the skin every 3 (three) days. (Patient  not taking: Reported on 08/20/2015) 5 patch 0  . oxyCODONE-acetaminophen (PERCOCET/ROXICET) 5-325 MG tablet Take 1 tablet by mouth every 4 (four) hours as needed for moderate pain. (Patient not taking: Reported on 08/20/2015) 30 tablet 0     REVIEW OF SYSTEMS: Cardiovascular: No chest pain, chest pressure, palpitations, orthopnea, or dyspnea on exertion. No claudication or rest pain,  No history of DVT or phlebitis. Pulmonary: No productive cough, asthma or wheezing. Neurologic: No  weakness, paresthesias, aphasia, or amaurosis. No dizziness. Hematologic: No bleeding problems or clotting disorders. Musculoskeletal: No joint pain or joint swelling. Gastrointestinal: No blood in stool or hematemesis Genitourinary: No dysuria or hematuria. Psychiatric:: No history of major depression. Integumentary: No rashes or ulcers. Constitutional: No fever or chills.  PHYSICAL EXAMINATION: General: The patient appears their stated age.  Vital signs are BP 124/56 mmHg  Pulse 79  Temp(Src) 99.5 F (37.5 C) (Rectal)  Resp 16  Wt 175 lb (79.379 kg)  SpO2 99% Pulmonary: Respirations are non-labored HEENT:  No gross abnormalities Abdomen: Soft and non-tender  Musculoskeletal: There are no major deformities.   Neurologic: No focal weakness or paresthesias are detected, Skin: Ulcer on dorsum of right  Foot.  Necrotic base to right 5th toe amputation site. No erythema around wounds Psychiatric: The patient has normal affect. 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 He does have osteo of the 5th metatarsal head.  He will need to have this further resected.  Will check doppler studies to make sure the previous interventions remain patent     V. Leia Alf, M.D. [facility] [place] Office: [phone] Pager:  [phone]

## 2015-08-21 NOTE — ED Notes (Signed)
CHECKED CBG 131

## 2015-08-21 NOTE — ED Notes (Signed)
Patient shivering in bed. Reports he felt "cold". Warm blankets applied. Repositioned in bed. Temp checked. Afebrile. 97.5 temp. Heart rate elevated. Notified Dr. Hal Hope.

## 2015-08-21 NOTE — ED Notes (Signed)
Pt arrives from [facility] via [place]. Pt was seen at [facility] yesterday for generalized weakness, transported out today for same. Pt was hypotensive on scene and remains that way upon arrival. Pt pulse is weak and thready, he's diaphoretic, hypotensive and tachycardic (per baseline).

## 2015-08-21 NOTE — Telephone Encounter (Signed)
Post ED Visit - Positive Culture Follow-up  Culture report reviewed by antimicrobial stewardship pharmacist:  '[]'$  Elenor Quinones, Pharm.D. '[]'$  Heide Guile, Pharm.D., BCPS '[]'$  Parks Neptune, Pharm.D. '[]'$  Alycia Rossetti, Pharm.D., BCPS '[]'$  [place]yan[place]o, Pharm.D., BCPS, AAHIVP '[]'$  Legrand Como, Pharm.D., BCPS, AAHIVP '[x]'$  Cassie Stewart, Pharm.D. '[]'$  Stephens November, Pharm.D.  Positive wound culture Treated with bactrim DS, organism sensitive to the same and no further patient follow-up is required at this time.  Hazle Nordmann 08/21/2015, 10:55 AM

## 2015-08-21 NOTE — ED Notes (Signed)
Dr. Hal Hope at bedside at this time

## 2015-08-21 NOTE — ED Notes (Signed)
Patient transported to CT 

## 2015-08-21 NOTE — Progress Notes (Signed)
Pharmacy Antibiotic Note  Craig Bentley is a 60 y.o. male admitted on 08/21/2015 with sepsis.  Pharmacy has been consulted for vancomycin and zosyn dosing.  Pt received vancomycin 1g and zosyn 3.375g IV once in the ED.   Plan: Vancomycin '750mg'$  IV every 12 hours.  Goal trough 15-20 mcg/mL. Zosyn 3.375g IV q8h (4 hour infusion).  Monitor culture data, renal function and clinical course VT at SS prn     Temp (24hrs), Avg:99.5 F (37.5 C), Min:99.5 F (37.5 C), Max:99.5 F (37.5 C)   Recent Labs Lab 08/20/15 1418 08/21/15 1735 08/21/15 1742  WBC 14.7* 11.9*  --   CREATININE 1.08 1.51*  --   LATICACIDVEN  --   --  1.21    CrCl cannot be calculated (Unknown ideal weight.).    No Known Allergies  Antimicrobials this admission: Vanc 6/29 >>  Zosyn 6/29 >>   Dose adjustments this admission: n/a  Microbiology results:  BCx:   UCx:    Sputum:    MRSA PCR:    Andrey Cota. Diona Foley, PharmD, [place] Clinical Pharmacist Pager [phone] 08/21/2015 6:42 PM

## 2015-08-22 ENCOUNTER — Other Ambulatory Visit (HOSPITAL_COMMUNITY): Payer: Self-pay

## 2015-08-22 ENCOUNTER — Ambulatory Visit: Payer: Medicaid Other | Admitting: Vascular Surgery

## 2015-08-22 DIAGNOSIS — M868X7 Other osteomyelitis, ankle and foot: Secondary | ICD-10-CM

## 2015-08-22 DIAGNOSIS — I96 Gangrene, not elsewhere classified: Secondary | ICD-10-CM

## 2015-08-22 LAB — COMPREHENSIVE METABOLIC PANEL
ALBUMIN: 2.2 g/dL — AB (ref 3.5–5.0)
ALK PHOS: 55 U/L (ref 38–126)
ALT: 25 U/L (ref 17–63)
AST: 25 U/L (ref 15–41)
Anion gap: 7 (ref 5–15)
BUN: 30 mg/dL — AB (ref 6–20)
CALCIUM: 8.4 mg/dL — AB (ref 8.9–10.3)
CHLORIDE: 105 mmol/L (ref 101–111)
CO2: 25 mmol/L (ref 22–32)
CREATININE: 1.05 mg/dL (ref 0.61–1.24)
GFR calc Af Amer: >60 mL/min (ref >60)
GFR calc non Af Amer: >60 mL/min (ref >60)
GLUCOSE: 172 mg/dL — AB (ref 65–99)
Potassium: 4 mmol/L (ref 3.5–5.1)
SODIUM: 137 mmol/L (ref 135–145)
Total Bilirubin: 0.7 mg/dL (ref 0.3–1.2)
Total Protein: 6.1 g/dL — ABNORMAL LOW (ref 6.5–8.1)

## 2015-08-22 LAB — GLUCOSE, CAPILLARY
GLUCOSE-CAPILLARY: 91 mg/dL (ref 65–99)
GLUCOSE-CAPILLARY: 121 mg/dL — AB (ref 65–99)
GLUCOSE-CAPILLARY: 145 mg/dL — AB (ref 65–99)
Glucose-Capillary: 122 mg/dL — ABNORMAL HIGH (ref 65–99)

## 2015-08-22 LAB — CBC WITH DIFFERENTIAL/PLATELET
BASOS ABS: 0 10*3/uL (ref 0.0–0.1)
BASOS PCT: 0 %
EOS ABS: 0 10*3/uL (ref 0.0–0.7)
Eosinophils Relative: 0 %
HCT: 29.5 % — ABNORMAL LOW (ref 39.0–52.0)
Hemoglobin: 9.1 g/dL — ABNORMAL LOW (ref 13.0–17.0)
Lymphocytes Relative: 10 %
Lymphs Abs: 1.1 10*3/uL (ref 0.7–4.0)
MCH: 26.4 pg (ref 26.0–34.0)
MCHC: 30.8 g/dL (ref 30.0–36.0)
MCV: 85.5 fL (ref 78.0–100.0)
Monocytes Absolute: 0.3 10*3/uL (ref 0.1–1.0)
Monocytes Relative: 2 %
NEUTROS ABS: 9.5 10*3/uL — AB (ref 1.7–7.7)
NEUTROS PCT: 88 %
Platelets: 265 10*3/uL (ref 150–400)
RBC: 3.45 MIL/uL — AB (ref 4.22–5.81)
RDW: 14.3 % (ref 11.5–15.5)
WBC: 10.9 10*3/uL — ABNORMAL HIGH (ref 4.0–10.5)

## 2015-08-22 LAB — AMMONIA: AMMONIA: 32 umol/L (ref 9–35)

## 2015-08-22 LAB — LACTIC ACID, PLASMA
Lactic Acid, Venous: 1 mmol/L (ref 0.5–1.9)
Lactic Acid, Venous: 0.8 mmol/L (ref 0.5–1.9)

## 2015-08-22 LAB — MRSA PCR SCREENING: MRSA by PCR: NEGATIVE

## 2015-08-22 LAB — PROCALCITONIN: Procalcitonin: 0.37 ng/mL

## 2015-08-22 MED ORDER — IPRATROPIUM-ALBUTEROL 0.5-2.5 (3) MG/3ML IN SOLN: 3.0000 mL | RESPIRATORY_TRACT | Status: DC | PRN

## 2015-08-22 MED ORDER — CLOZAPINE 100 MG PO TABS
100.0000 mg | ORAL_TABLET | ORAL | Status: DC
  Administered 2015-08-22 – 2015-08-28 (×12): 100 mg via ORAL
  Filled 2015-08-22 (×14): qty 1

## 2015-08-22 MED ORDER — CLOZAPINE 100 MG PO TABS
400.0000 mg | ORAL_TABLET | Freq: Every day | ORAL | Status: DC
  Administered 2015-08-22 – 2015-08-27 (×6): 400 mg via ORAL
  Filled 2015-08-22 (×9): qty 4

## 2015-08-22 MED ORDER — HYDRALAZINE HCL 20 MG/ML IJ SOLN: 10.0000 mg | INTRAMUSCULAR | Status: DC | PRN

## 2015-08-22 NOTE — Progress Notes (Signed)
Pt states he does not take breathing medicines at home.

## 2015-08-22 NOTE — Consult Note (Signed)
ORTHOPAEDIC CONSULTATION  REQUESTING PHYSICIAN: Karmen Bongo, MD  Chief Complaint: Painful dry gangrene right foot  HPI: Craig Bentley is a 60 y.o. male who presents with dry gangrenous changes to the right foot. Patient is status post saphenous graft that has an occluded graft at this time. Patient is seen for evaluation for foot salvage interventions if revascularization would be an option.  Past Medical History  Diagnosis Date  . Hypertension   . Diabetes mellitus   . Schizophrenia, schizo-affective ([place])   . COPD (chronic obstructive pulmonary disease) ([place])   . Constipation   . Fecal impaction ([place])   . Pneumonia   . Anxiety    Past Surgical History  Procedure Laterality Date  . Peripheral vascular catheterization N/A 04/07/2015    Procedure: Abdominal Aortogram;  Surgeon: Angelia Mould, MD;  Location: [place] [facility];  Service: Cardiovascular;  Laterality: N/A;  . Aorta - bilateral femoral artery bypass graft Bilateral 04/23/2015    Procedure: AORTOBIFEMORAL BYPASS GRAFT;  Surgeon: Conrad [place], MD;  Location: [place];  Service: Vascular;  Laterality: Bilateral;  . Femoral-tibial bypass graft Right 04/23/2015    Procedure: Right Common Femoral to Posterior Tibial Bypass with Composite Vein Graft. ;  Surgeon: Conrad [place], MD;  Location: [place];  Service: Vascular;  Laterality: Right;  . Amputation Right 04/23/2015    Procedure: Right Fifth Toe Amputation;  Surgeon: Conrad Montour, MD;  Location[facility];  Service: Vascular;  Laterality: Right;  . Femoral-femoral bypass graft Left 04/23/2015    Procedure:  Left Common Femoral Artery to Superficial Femoral Artery Bypass Graft with Vein;  Surgeon: Conrad [place], MD;  Location: [facility];  Service: Vascular;  Laterality: Left;   Social History   Social History  . Marital Status: Single    Spouse Name: N/A  . Number of Children: N/A  . Years of Education: N/A   Social History Main Topics  . Smoking status: Current Every Day  Smoker -- 1.50 packs/day for 50 years    Types: Cigarettes  . Smokeless tobacco: Never Used  . Alcohol Use: No  . Drug Use: No  . Sexual Activity: No   Other Topics Concern  . None   Social History Narrative   Family History  Problem Relation Age of Onset  . Mental illness Mother    - negative except otherwise stated in the family history section No Known Allergies Prior to Admission medications   Medication Sig Start Date End Date Taking? Authorizing Provider  acetaminophen (TYLENOL) 325 MG tablet Take 650 mg by mouth every 6 (six) hours as needed for mild pain, fever or headache.    Yes Historical Provider, MD  albuterol (PROAIR HFA) 108 (90 Base) MCG/ACT inhaler Inhale 2 puffs into the lungs every 6 (six) hours as needed for shortness of breath (cough).   Yes Historical Provider, MD  albuterol (PROVENTIL) (2.5 MG/3ML) 0.083% nebulizer solution Take 3 mLs (2.5 mg total) by nebulization every 4 (four) hours as needed for wheezing. 02/19/15  Yes Theodis Blaze, MD  cloZAPine (CLOZARIL) 100 MG tablet Take 100-400 mg by mouth 3 (three) times daily. Take 1 tablet (100 mg) by mouth daily at 8am and 4pm, take 4 tablets (400 mg) at bedtime   Yes Historical Provider, MD  dicyclomine (BENTYL) 20 MG tablet Take 20 mg by mouth 2 (two) times daily as needed for spasms.   Yes Historical Provider, MD  donepezil (ARICEPT) 5 MG tablet Take 5  mg by mouth at bedtime.   Yes Historical Provider, MD  doxycycline (VIBRA-TABS) 100 MG tablet Take 100 mg by mouth 2 (two) times daily.   Yes Historical Provider, MD  gabapentin (NEURONTIN) 300 MG capsule Take 300 mg by mouth 3 (three) times daily.   Yes Historical Provider, MD  guaifenesin (ROBITUSSIN) 100 MG/5ML syrup Take 200 mg by mouth 4 (four) times daily as needed for cough.   Yes Historical Provider, MD  haloperidol (HALDOL) 5 MG tablet Take 5 mg by mouth at bedtime.   Yes Historical Provider, MD  hydrochlorothiazide (HYDRODIURIL) 25 MG tablet Take 25 mg by  mouth daily.   Yes Historical Provider, MD  ibuprofen (ADVIL,MOTRIN) 200 MG tablet Take 400 mg by mouth 2 (two) times daily as needed (pain).    Yes Historical Provider, MD  ipratropium-albuterol (DUONEB) 0.5-2.5 (3) MG/3ML SOLN Take 3 mLs by nebulization 3 (three) times daily. 02/19/15  Yes Theodis Blaze, MD  lactulose (CHRONULAC) 10 GM/15ML solution Take 30 g by mouth 2 (two) times daily.   Yes Historical Provider, MD  lisinopril (PRINIVIL,ZESTRIL) 20 MG tablet Take 20 mg by mouth daily.   Yes Historical Provider, MD  metFORMIN (GLUCOPHAGE) 500 MG tablet Take 500 mg by mouth 2 (two) times daily with a meal.   Yes Historical Provider, MD  metoprolol tartrate (LOPRESSOR) 25 MG tablet Take 25 mg by mouth 2 (two) times daily.   Yes Historical Provider, MD  Multiple Vitamin (MULITIVITAMIN WITH MINERALS) TABS Take 1 tablet by mouth daily.   Yes Historical Provider, MD  pantoprazole (PROTONIX) 40 MG tablet Take 1 tablet (40 mg total) by mouth daily. 02/20/15  Yes Theodis Blaze, MD  polyethylene glycol [facility] Floria Raveling) packet Take 17 g by mouth daily as needed (constipation). Mix in 8 oz liquid and drink   Yes Historical Provider, MD  senna (SENOKOT) 8.6 MG TABS Take 1 tablet by mouth 2 (two) times daily.   Yes Historical Provider, MD  senna-docusate (SENOKOT-S) 8.6-50 MG tablet Take 1 tablet by mouth at bedtime as needed for mild constipation. 05/01/15  Yes Alvia Grove, PA-C  simvastatin (ZOCOR) 5 MG tablet Take 5 mg by mouth at bedtime.   Yes Historical Provider, MD  sulfamethoxazole-trimethoprim (BACTRIM DS,SEPTRA DS) 800-160 MG tablet Take 1 tablet by mouth 2 (two) times daily. 08/17/15 08/24/15 Yes Isla Pence, MD  trihexyphenidyl (ARTANE) 5 MG tablet Take 5 mg by mouth 2 (two) times daily with a meal.   Yes Historical Provider, MD  aspirin EC 81 MG tablet Take 1 tablet (81 mg total) by mouth daily. Patient not taking: Reported on 08/20/2015 05/01/15   Alvia Grove, PA-C  fentaNYL (DURAGESIC)  50 MCG/HR Place 1 patch (50 mcg total) onto the skin every 3 (three) days. Patient not taking: Reported on 08/20/2015 08/17/15   Isla Pence, MD  oxyCODONE-acetaminophen (PERCOCET/ROXICET) 5-325 MG tablet Take 1 tablet by mouth every 4 (four) hours as needed for moderate pain. Patient not taking: Reported on 08/20/2015 05/01/15   Alvia Grove, PA-C   Ct Head Wo Contrast  08/21/2015  CLINICAL DATA:  Acute mental status change. EXAM: CT HEAD WITHOUT CONTRAST TECHNIQUE: Contiguous axial images were obtained from the base of the skull through the vertex without intravenous contrast. COMPARISON:  CT of the brain June 10, 2009 FINDINGS: Paranasal sinuses, mastoid air cells, and bones are normal. Mild soft tissue swelling over the posterior calvarium near the vertex. Recommend clinical correlation. Extracranial soft tissues are otherwise  within normal limits. No subdural, epidural, or subarachnoid hemorrhage. The cerebellum, brainstem, and basal cisterns are within normal limits. Ventricles and sulci are unremarkable. No acute cortical ischemia or infarct. No mass, mass effect, or midline shift. IMPRESSION: No acute intracranial process. Electronically Signed   By: Dorise Bullion III M.D   On: 08/21/2015 23:07   Dg Chest Port 1 View  08/21/2015  CLINICAL DATA:  Pt arrives from [facility] via [place]. Pt was seen at [facility] yesterday for generalized weakness, transported out today for same. Pt was hypotensive on scene and remains that way upon arrival. Diaphoretic. EXAM: PORTABLE CHEST 1 VIEW COMPARISON:  04/24/2015 FINDINGS: Heart size is within normal limits. There is minimal density at the left lung base which may represent residual atelectasis or scarring. Overall the appearance is improved since the prior study. The right lung is clear. No pulmonary edema. IMPRESSION: 1. Minimal left lower lobe density, which may represent chronic atelectasis or infiltrate. 2. Is hard to entirely exclude a recurrent left lower  lobe infectious process. Electronically Signed   By: Nolon Nations M.D.   On: 08/21/2015 16:15   Dg Foot Complete Right  08/21/2015  CLINICAL DATA:  RIGHT foot pain for an unknown period of time, unresponsive, diabetes mellitus, hypertension, peripheral vascular disease EXAM: RIGHT FOOT COMPLETE - 3+ VIEW COMPARISON:  04/02/2015 FINDINGS: Marked osseous demineralization. Joint spaces preserved. Dressing artifacts at the amputation bed from fifth toe amputation. Bone destruction of the fifth metatarsal head consistent with osteomyelitis. No fracture, dislocation, or additional bone destruction. Scattered soft tissue swelling RIGHT foot along the lateral and dorsal margins. IMPRESSION: Prior amputation of the fifth toe with destruction of the fifth metatarsal head consistent with osteomyelitis. Osseous demineralization. Electronically Signed   By: Lavonia Dana M.D.   On: 08/21/2015 17:12   - pertinent xrays, CT, MRI studies were reviewed and independently interpreted  Positive ROS: All other systems have been reviewed and were otherwise negative with the exception of those mentioned in the HPI and as above.  Physical Exam: General: Alert, no acute distress Cardiovascular: No pedal edema Respiratory: No cyanosis, no use of accessory musculature GI: No organomegaly, abdomen is soft and non-tender Skin: Dry gangrenous changes to the entire forefoot. There is ischemic ulcers over the ankle the foot has thin atrophic skin with decreased turgor. Neurologic: Patient does not have protective sensation. Psychiatric: Patient is competent for consent with normal mood and affect Lymphatic: No axillary or cervical lymphadenopathy  MUSCULOSKELETAL:  Examination patient has ischemic changes to the entire right foot and ankle. He does not have palpable pulses does not have resected sensation.  Assessment: Assessment: Dry gangrene right foot.  Plan: Patient's best option at this time would be a transtibial  amputation. With revascularization patient still would not have a viable foot. Patient may require an above-the-knee amputation but will attempt a transtibial amputation. Plan for surgery on Wednesday.  Thank you for the consult and the opportunity to see Craig Bentley, [place] [phone] 5:07 PM

## 2015-08-22 NOTE — Progress Notes (Addendum)
   Daily Progress Note  Assessment/Planning: R foot osteomyelitis and gangrene, Occluded R CFA to PTA bypass with composite graft, schizophrenia impairing judgment   This patient is well knew to me from his prior R foot gangrene which lead to:  s/p ABF, R CFA to PTA with composite graft, L CFA To SFA BPG w/ ips reversed GSV, R 5th toe amputation (Date: 04/25/15)  During his prior admission, his initial surgery was delayed almost 2 weeks due cognitive challenges related to his schizophrenia  I suspect these cognitive issues, social circumstances, and financial issues have limited his return to the office.  In my opinion, this right foot is not salvageable and the patient should consider R BKA vs AKA.  The patient did not have adequate vein for use as a conduit.  The harvested vein was diseased in multiple segments.  The poor durability of this bypass reflects both sub-optimal conduit and sub-optimal target vessel.  The only other target would be an anterior tibial artery.  In this patient, use of a cryovein or plastic would be necessary. Neither of these options have good long-term patency.  The patient has an aortobifemoral bypass, so wide spectrum antibiotics and source control needs to be the priority.  Infected aortic grafts have a mortality rate between 50-100% depending on the series reviewed.  I have tried to communicate these issues to the patient, but again his cognitive limitations interfere with this conversation.  I would get Dr. Sharol Given to see the patient to see if he feels the right foot is salvageable.   Subjective    Pain tolerable  Objective Filed Vitals:   08/22/15 0416 08/22/15 0500 08/22/15 0600 08/22/15 0800  BP: 127/65   130/64  Pulse: 111 146  106  Temp: 99.1 F (37.3 C) 99.7 F (37.6 C)  98.5 F (36.9 C)  TempSrc: Oral   Oral  Resp: '22 23 24 19  '$ Height:      Weight:  162 lb 0.6 oz (73.5 kg)    SpO2: 93% 98%  97%    Intake/Output Summary (Last 24  hours) at 08/22/15 0841 Last data filed at 08/22/15 0600  Gross per 24 hour  Intake   1775 ml  Output    550 ml  Net   1225 ml   VASC  All incision healed, R foot: ischemic ulceration on dorsum near ankle, ischemic ulcer overlying 5th toe amputation, warm feet, warm calf; no palpable R foot pulses, strong bilateral femoral pulses  Laboratory CBC    Component Value Date/Time   WBC 10.9* 08/22/2015 0312   HGB 9.1* 08/22/2015 0312   HCT 29.5* 08/22/2015 0312   PLT 265 08/22/2015 0312    BMET    Component Value Date/Time   NA 137 08/22/2015 0312   K 4.0 08/22/2015 0312   CL 105 08/22/2015 0312   CO2 25 08/22/2015 0312   GLUCOSE 172* 08/22/2015 0312   BUN 30* 08/22/2015 0312   CREATININE 1.05 08/22/2015 0312   CALCIUM 8.4* 08/22/2015 0312   GFRNONAA >60 08/22/2015 0312   GFRAA >60 08/22/2015 3762    Adele Barthel, MD [facility] Office: [phone] Pager: [phone]  08/22/2015, 8:41 AM

## 2015-08-22 NOTE — Progress Notes (Addendum)
PROGRESS NOTE    Craig Bentley  ZDG:[id number] DOB: [birth year] DOA: 08/21/2015 PCP: Mabeline Caras, NP  Outpatient Specialists:     Brief Narrative:  HPI: Craig Bentley is a 60 y.o. male with peripheral vascular disease status post aorto femoral bypass and right foot fifth toe amputation in March 2017, hypertension, diabetes mellitus, hyperlipidemia and COPD was brought to the ER, the patient was found to be confused at the living facility. Patient had come to the ER yesterday for foot pain and at that time also patient was found to be confused and patient's fentanyl patch was discontinued. Today in the ER patient was found to be hypotensive febrile with temperatures around 102F tachycardic mild leukocytosis with x-ray of the right foot shows features concerning for osteomyelitis. Patient was given 1 dose of Narcan in the ER following which patient became more alert awake. On exam patient has pinpoint pupils. By the time I examined patient patient has become more alert and awake. Follows commands. Does complain of pain in the right foot. Denies any chest pain or shortness of breath. Vascular surgeon Dr. Trula Slade has seen the patient in consult and patient will be admitted for further management of sepsis probably from osteomyelitis right foot.   Assessment & Plan:   Principal Problem:   Sepsis ([place]) Active Problems:   Essential hypertension   COPD (chronic obstructive pulmonary disease) (HCC)   Diabetes mellitus with neurologic complication, without long-term current use of insulin (HCC)   PVD (peripheral vascular disease) ([place])   Schizophrenia ([place])   ARF (acute renal failure) ([place])   Acute encephalopathy    Sepsis from osteomyelitis -  On admission patient had tachycardia fever hypotension and fits and further criteria for sepsis. Blood pressure improved with fluid bolus.  Since admission, 1 fever to 102.5.  BP good since initial bolus.  Tachycardia overnight which has resolved today.   Likely able to move to telemetry tomorrow.  osteomyelitis of the right foot - appreciate vascular surgery consult. Patient well known to their service from prior admission with R foot gangrene, s/p ABF, R CFA to PTA with composite graft, L CFA To SFA BPG w/ ips reversed GSV, R 5th toe amputation (Date: 04/25/15).  Patient has been placed on vancomycin and Zosyn. Further recommendations per vascular surgery.  Likely to need R BKA vs. AKA as per vasc surgery - Dr. Sharol Given consulted.  Dr. Sharol Given suggests transtibial amputation (possibly AKA if unsuccessful) - he will plan for surgery on Wednesday.  Acute encephalopathy - possibly related to pain medications. Resolved on my evaluation today.  Holding gabapentin for now. Patient's fentanyl was discontinued 2 days ago. CT head is unremarkable. Patient is presently following commands. Closely observe. Check ammonia levels.  Acute renal failure probably from hypotension and patient being on ACE inhibitor - hold off ACE inhibitor and hydrochlorothiazide and continue hydration. Now resolved. Recheck metabolic panel in AM and resume then if normalizing.  Hypertension - since patient was hypotensive on presentation will hold antihypertensives. Will continue beta blockers. When necessary IV hydralazine for systolic blood pressure more than 160.  Diabetes mellitus type 2 - hold metformin while inpatient and keep patient on sliding scale coverage.  History of schizophrenia - on Haldol clozapine and trihexyphenidyl.  COPD - presently not wheezing continue inhalers.  Peripheral vascular disease status post aortofemoral bypass - continue aspirin and statins.     DVT prophylaxis: SCDs in anticipation of procedure. Code Status: Full code.  Family Communication: No family at the bedside.  Disposition Plan: Nursing home after discharge (late next week at best); transfer to telemetry or med-surg tomorrow if doing well Admission status: Inpatient.   Consultants:    Vascular surgery  Orthopedics  Procedures:     Antimicrobials:      Subjective: Patient reports that he will probably lose his foot, "it is bad."  Does not seem affected by this.  Otherwise without complaint other than recent BM.  Objective: Filed Vitals:   08/22/15 0400 08/22/15 0416 08/22/15 0500 08/22/15 0600  BP: 127/65 127/65    Pulse:  111 146   Temp:  99.1 F (37.3 C) 99.7 F (37.6 C)   TempSrc:  Oral    Resp: '19 22 23 24  '$ Height:      Weight:   73.5 kg (162 lb 0.6 oz)   SpO2:  93% 98%     Intake/Output Summary (Last 24 hours) at 08/22/15 0705 Last data filed at 08/22/15 0600  Gross per 24 hour  Intake   1775 ml  Output    550 ml  Net   1225 ml   Filed Weights   08/21/15 1855 08/21/15 2350 08/22/15 0500  Weight: 79.379 kg (175 lb) 73.8 kg (162 lb 11.2 oz) 73.5 kg (162 lb 0.6 oz)    Examination:  General exam: Appears calm and comfortable, disheveled  Respiratory system: Clear to auscultation. Respiratory effort normal. Cardiovascular system: S1 & S2 heard, RRR. No JVD, murmurs, rubs, gallops or clicks. No pedal edema. Gastrointestinal system: Abdomen is nondistended, soft and nontender. No organomegaly or masses felt. Normal bowel sounds heard. Central nervous system: Alert and oriented. No focal neurological deficits. Extremities: s/p R 5th toe amputation, now with dry gangrene to the entire forefoot Skin: No rashes, lesions or ulcers Psychiatry: Judgement and insight appear normal. Mood & affect appropriate.     Data Reviewed: I have personally reviewed following labs and imaging studies  CBC:  Recent Labs Lab 08/20/15 1418 08/21/15 1735 08/22/15 0312  WBC 14.7* 11.9* 10.9*  NEUTROABS  --  9.8* 9.5*  HGB 9.2* 9.1* 9.1*  HCT 28.9* 29.3* 29.5*  MCV 83.8 84.2 85.5  PLT 283 242 409   Basic Metabolic Panel:  Recent Labs Lab 08/20/15 1418 08/21/15 1735 08/22/15 0312  NA 138 141 137  K 4.3 3.9 4.0  CL 101 110 105  CO2 '28 25 25   '$ GLUCOSE 90 109* 172*  BUN 37* 39* 30*  CREATININE 1.08 1.51* 1.05  CALCIUM 9.2 7.8* 8.4*   GFR: Estimated Creatinine Clearance: 77.8 mL/min (by C-G formula based on Cr of 1.05). Liver Function Tests:  Recent Labs Lab 08/21/15 1735 08/22/15 0312  AST 24 25  ALT 21 25  ALKPHOS 48 55  BILITOT 0.6 0.7  PROT 5.3* 6.1*  ALBUMIN 2.0* 2.2*   No results for input(s): LIPASE, AMYLASE in the last 168 hours.  Recent Labs Lab 08/22/15 0413  AMMONIA 32   Coagulation Profile: No results for input(s): INR, PROTIME in the last 168 hours. Cardiac Enzymes: No results for input(s): CKTOTAL, CKMB, CKMBINDEX, TROPONINI in the last 168 hours. BNP (last 3 results) No results for input(s): PROBNP in the last 8760 hours. HbA1C: No results for input(s): HGBA1C in the last 72 hours. CBG:  Recent Labs Lab 08/20/15 1456 08/21/15 1543 08/21/15 1717  GLUCAP 96 135* 131*   Lipid Profile: No results for input(s): CHOL, HDL, LDLCALC, TRIG, CHOLHDL, LDLDIRECT in the last 72 hours. Thyroid Function Tests: No results for input(s): TSH, T4TOTAL, FREET4,  T3FREE, THYROIDAB in the last 72 hours. Anemia Panel: No results for input(s): VITAMINB12, FOLATE, FERRITIN, TIBC, IRON, RETICCTPCT in the last 72 hours. Urine analysis:    Component Value Date/Time   COLORURINE AMBER* 08/21/2015 [place] 08/21/2015 1854   LABSPEC 1.022 08/21/2015 1854   PHURINE 5.5 08/21/2015 1854   GLUCOSEU NEGATIVE 08/21/2015 1854   HGBUR NEGATIVE 08/21/2015 [place] NEGATIVE 08/21/2015 [place] 08/21/2015 1854   PROTEINUR NEGATIVE 08/21/2015 1854   UROBILINOGEN 1.0 09/16/2013 2243   NITRITE NEGATIVE 08/21/2015 1854   LEUKOCYTESUR NEGATIVE 08/21/2015 1854   Sepsis Labs: '@LABRCNTIP'$ (procalcitonin:4,lacticidven:4)  ) Recent Results (from the past 240 hour(s))  Wound or Superficial Culture     Status: None   Collection Time: 08/17/15  9:23 AM  Result Value Ref Range Status    Specimen Description WOUND FOOT RIGHT  Final   Special Requests Normal  Final   Gram Stain   Final    FEW WBC PRESENT, PREDOMINANTLY PMN RARE GRAM POSITIVE COCCOBACILLUS Performed at [facility]    Culture   Final    MODERATE METHICILLIN RESISTANT STAPHYLOCOCCUS AUREUS   Report Status 08/20/2015 FINAL  Final   Organism ID, Bacteria METHICILLIN RESISTANT STAPHYLOCOCCUS AUREUS  Final      Susceptibility   Methicillin resistant staphylococcus aureus - MIC*    CIPROFLOXACIN >=8 RESISTANT Resistant     ERYTHROMYCIN <=0.25 SENSITIVE Sensitive     GENTAMICIN <=0.5 SENSITIVE Sensitive     OXACILLIN >=4 RESISTANT Resistant     TETRACYCLINE 2 SENSITIVE Sensitive     VANCOMYCIN 1 SENSITIVE Sensitive     TRIMETH/SULFA <=10 SENSITIVE Sensitive     CLINDAMYCIN <=0.25 SENSITIVE Sensitive     RIFAMPIN <=0.5 SENSITIVE Sensitive     Inducible Clindamycin NEGATIVE Sensitive     * MODERATE METHICILLIN RESISTANT STAPHYLOCOCCUS AUREUS  MRSA PCR Screening     Status: None   Collection Time: 08/22/15 12:10 AM  Result Value Ref Range Status   MRSA by PCR NEGATIVE NEGATIVE Final    Comment:        The GeneXpert MRSA Assay (FDA approved for NASAL specimens only), is one component of a comprehensive MRSA colonization surveillance program. It is not intended to diagnose MRSA infection nor to guide or monitor treatment for MRSA infections.          Radiology Studies: Ct Head Wo Contrast  08/21/2015  CLINICAL DATA:  Acute mental status change. EXAM: CT HEAD WITHOUT CONTRAST TECHNIQUE: Contiguous axial images were obtained from the base of the skull through the vertex without intravenous contrast. COMPARISON:  CT of the brain June 10, 2009 FINDINGS: Paranasal sinuses, mastoid air cells, and bones are normal. Mild soft tissue swelling over the posterior calvarium near the vertex. Recommend clinical correlation. Extracranial soft tissues are otherwise within normal limits. No subdural,  epidural, or subarachnoid hemorrhage. The cerebellum, brainstem, and basal cisterns are within normal limits. Ventricles and sulci are unremarkable. No acute cortical ischemia or infarct. No mass, mass effect, or midline shift. IMPRESSION: No acute intracranial process. Electronically Signed   By: Dorise Bullion III M.D   On: 08/21/2015 23:07   Dg Chest Port 1 View  08/21/2015  CLINICAL DATA:  Pt arrives from [facility] via [place]. Pt was seen at [facility] yesterday for generalized weakness, transported out today for same. Pt was hypotensive on scene and remains that way upon arrival. Diaphoretic. EXAM: PORTABLE CHEST 1 VIEW COMPARISON:  04/24/2015 FINDINGS: Heart  size is within normal limits. There is minimal density at the left lung base which may represent residual atelectasis or scarring. Overall the appearance is improved since the prior study. The right lung is clear. No pulmonary edema. IMPRESSION: 1. Minimal left lower lobe density, which may represent chronic atelectasis or infiltrate. 2. Is hard to entirely exclude a recurrent left lower lobe infectious process. Electronically Signed   By: Nolon Nations M.D.   On: 08/21/2015 16:15   Dg Foot Complete Right  08/21/2015  CLINICAL DATA:  RIGHT foot pain for an unknown period of time, unresponsive, diabetes mellitus, hypertension, peripheral vascular disease EXAM: RIGHT FOOT COMPLETE - 3+ VIEW COMPARISON:  04/02/2015 FINDINGS: Marked osseous demineralization. Joint spaces preserved. Dressing artifacts at the amputation bed from fifth toe amputation. Bone destruction of the fifth metatarsal head consistent with osteomyelitis. No fracture, dislocation, or additional bone destruction. Scattered soft tissue swelling RIGHT foot along the lateral and dorsal margins. IMPRESSION: Prior amputation of the fifth toe with destruction of the fifth metatarsal head consistent with osteomyelitis. Osseous demineralization. Electronically Signed   By: Lavonia Dana M.D.   On:  08/21/2015 17:12        Scheduled Meds: . cloZAPine  100 mg Oral 2 times per day  . cloZAPine  400 mg Oral QHS  . donepezil  5 mg Oral QHS  . haloperidol  5 mg Oral QHS  . insulin aspart  0-9 Units Subcutaneous TID WC  . ipratropium-albuterol  3 mL Nebulization TID  . lactulose  30 g Oral BID  . metoprolol tartrate  25 mg Oral BID  . pantoprazole  40 mg Oral Daily  . piperacillin-tazobactam (ZOSYN)  IV  3.375 g Intravenous Q8H  . simvastatin  5 mg Oral QHS  . trihexyphenidyl  5 mg Oral BID WC  . vancomycin  750 mg Intravenous Q12H   Continuous Infusions: . sodium chloride 100 mL/hr at 08/22/15 0115     LOS: 1 day    Time spent: 35 minutes    Karmen Bongo, MD [facility]   If 7PM-7AM, please contact night-coverage [email or web address] Password [name] 08/22/2015, 7:05 AM

## 2015-08-23 DIAGNOSIS — M869 Osteomyelitis, unspecified: Secondary | ICD-10-CM

## 2015-08-23 DIAGNOSIS — E1169 Type 2 diabetes mellitus with other specified complication: Secondary | ICD-10-CM | POA: Diagnosis present

## 2015-08-23 LAB — SURGICAL PCR SCREEN
MRSA, PCR: NEGATIVE
Staphylococcus aureus: NEGATIVE

## 2015-08-23 LAB — GLUCOSE, CAPILLARY
GLUCOSE-CAPILLARY: 147 mg/dL — AB (ref 65–99)
Glucose-Capillary: 148 mg/dL — ABNORMAL HIGH (ref 65–99)
Glucose-Capillary: 115 mg/dL — ABNORMAL HIGH (ref 65–99)
Glucose-Capillary: 145 mg/dL — ABNORMAL HIGH (ref 65–99)

## 2015-08-23 LAB — CBC
HEMATOCRIT: 31 % — AB (ref 39.0–52.0)
Hemoglobin: 9.6 g/dL — ABNORMAL LOW (ref 13.0–17.0)
MCH: 26 pg (ref 26.0–34.0)
MCHC: 31 g/dL (ref 30.0–36.0)
MCV: 84 fL (ref 78.0–100.0)
Platelets: 305 10*3/uL (ref 150–400)
RBC: 3.69 MIL/uL — ABNORMAL LOW (ref 4.22–5.81)
RDW: 14.1 % (ref 11.5–15.5)
WBC: 7 10*3/uL (ref 4.0–10.5)

## 2015-08-23 LAB — BASIC METABOLIC PANEL
Anion gap: 7 (ref 5–15)
BUN: 10 mg/dL (ref 6–20)
CHLORIDE: 103 mmol/L (ref 101–111)
CO2: 27 mmol/L (ref 22–32)
CREATININE: 0.88 mg/dL (ref 0.61–1.24)
Calcium: 8.9 mg/dL (ref 8.9–10.3)
GFR calc Af Amer: >60 mL/min (ref >60)
GFR calc non Af Amer: >60 mL/min (ref >60)
GLUCOSE: 149 mg/dL — AB (ref 65–99)
POTASSIUM: 3.9 mmol/L (ref 3.5–5.1)
Sodium: 137 mmol/L (ref 135–145)

## 2015-08-23 LAB — URINE CULTURE: CULTURE: NO GROWTH

## 2015-08-23 MED ORDER — GABAPENTIN 300 MG PO CAPS
300.0000 mg | ORAL_CAPSULE | Freq: Three times a day (TID) | ORAL | Status: DC
  Administered 2015-08-23 – 2015-08-28 (×13): 300 mg via ORAL
  Filled 2015-08-23: qty 3
  Filled 2015-08-23 (×12): qty 1

## 2015-08-23 NOTE — Clinical Social Work Note (Signed)
Clinical Social Work Assessment  Patient Details  Name: Craig Bentley MRN: [id number] Date of Birth: 18-Feb-1956  Date of referral:  08/22/15               Reason for consult:  Discharge Planning                Permission sought to share information with:  Facility Sport and exercise psychologist, Family Supports Permission granted to share information::  Yes, Verbal Permission Granted  Name::     Tonya  Agency::  [facility]  Relationship::  Sister  Contact Information:  [phone]  Housing/Transportation Living arrangements for the past 2 months:  [place] of Information:  Patient, Medical Team Patient Interpreter Needed:  None Criminal Activity/Legal Involvement Pertinent to Current Situation/Hospitalization:  No - Comment as needed Significant Relationships:  Siblings Lives with:  Facility Resident Do you feel safe going back to the place where you live?  Yes Need for family participation in patient care:  Yes (Comment)  Care giving concerns:  Patient from [place].   Social Worker assessment / plan:  CSW met with patient. No supports at bedside. CSW introduced role and explained that discharge planning would be discussed. CSW confirmed that patient is from [facility]. He states that he has been living there for 25 years but is unsure if he wants to return. He wants to decide after his surgery on Wednesday. No further concerns. CSW encouraged patient to contact CSW as needed. CSW will continue to follow patient and facilitate discharge to ALF if that is the plan after discharge on Wednesday (6/5).  Employment status:  Disabled (Comment on whether or not currently receiving Disability) Insurance information:  Medicaid In [place] PT Recommendations:  Not assessed at this time Information / Referral to community resources:  Other (Comment Required) (From [facility])  Patient/Family's Response to care:  Patient is unsure if he wants to return to ALF.  Patient's sister is supportive and involved in patient's care. Patient appreciated social work intervention.  Patient/Family's Understanding of and Emotional Response to Diagnosis, Current Treatment, and Prognosis:  Patient understands that he is having surgery on Wednesday and the reason for it.   Emotional Assessment Appearance:  Appears stated age Attitude/Demeanor/Rapport:   (Pleasant) Affect (typically observed):  Appropriate, Calm, Pleasant Orientation:  Oriented to Self, Oriented to Place, Oriented to  Time, Oriented to Situation Alcohol / Substance use:  Never Used Psych involvement (Current and /or in the community):  No (Comment)  Discharge Needs  Concerns to be addressed:  Care Coordination Readmission within the last 30 days:  No Current discharge risk:  Psychiatric Illness, Other (May need amputation above the knee.) Barriers to Discharge:  No Barriers Identified   Candie Chroman, LCSW 08/23/2015, 10:44 AM

## 2015-08-23 NOTE — Progress Notes (Signed)
PROGRESS NOTE   Craig Bentley JAS:[id number] DOB: Jan 14, 1956 DOA: 08/21/2015 PCP: Mabeline Caras, NP Outpatient Specialists:    Brief Narrative:  HPI: Craig Bentley is a 60 y.o. male with peripheral vascular disease status post aorto femoral bypass and right foot fifth toe amputation in March 2017, hypertension, diabetes mellitus, hyperlipidemia and COPD was brought to the ER, the patient was found to be confused at the living facility. Patient had come to the ER yesterday for foot pain and at that time also patient was found to be confused and patient's fentanyl patch was discontinued. Today in the ER patient was found to be hypotensive febrile with temperatures around 102F tachycardic mild leukocytosis with x-ray of the right foot shows features concerning for osteomyelitis. Patient was given 1 dose of Narcan in the ER following which patient became more alert awake. On exam patient has pinpoint pupils. By the time I examined patient patient has become more alert and awake. Follows commands. Does complain of pain in the right foot. Denies any chest pain or shortness of breath. Vascular surgeon Dr. Trula Slade has seen the patient in consult and patient will be admitted for further management of sepsis probably from osteomyelitis right foot.   Assessment & Plan:  Principal Problem:  Sepsis ([place]) Active Problems:  Essential hypertension  COPD (chronic obstructive pulmonary disease) (HCC)  Diabetes mellitus with neurologic complication, without long-term current use of insulin (HCC)  PVD (peripheral vascular disease) ([place])  Schizophrenia ([place])  ARF (acute renal failure) ([place])  Acute encephalopathy    Sepsis from osteomyelitis - On admission patient had tachycardia fever hypotension and fits and further criteria for sepsis. Blood pressure improved with fluid bolus. Since admission, 1 fever to 102.5. BP good since initial bolus. Tachycardia resolved.  Will  move to Med-Surg today.  Osteomyelitis of the right foot - appreciate vascular surgery and orthopedics consults. Patient well known to vascular service from prior admission with R foot gangrene, s/p ABF, R CFA to PTA with composite graft, L CFA To SFA BPG w/ ips reversed GSV, R 5th toe amputation (Date: 04/25/15). Patient has been placed on vancomycin and Zosyn. Likely to need R BKA vs. AKA as per vasc surgery - Dr. Sharol Given consulted. Dr. Sharol Given suggests transtibial amputation (possibly AKA if unsuccessful) - he will plan for surgery on Wednesday.  Acute encephalopathy - possibly related to pain medications. Resolved. Will resume gabapentin. Patient's fentanyl was discontinued 2 days prior to admission. CT head is unremarkable. Ammonia level 32 (WNL).  Acute renal failure probably from hypotension and patient being on ACE inhibitor - hold off ACE inhibitor and hydrochlorothiazide and continue hydration. Now resolved. Rechecked metabolic panel this AM but still pending; resume if normalizing.  Hypertension - since patient was hypotensive on presentation will hold antihypertensives. Will continue beta blockers. When necessary IV hydralazine for systolic blood pressure more than 160.  Diabetes mellitus type 2 - hold metformin while inpatient and keep patient on sliding scale coverage.  History of schizophrenia - on Haldol clozapine and trihexyphenidyl.  COPD - presently not wheezing continue inhalers.  Peripheral vascular disease status post aortofemoral bypass - continue aspirin and statins.     DVT prophylaxis: SCDs in anticipation of procedure. Code Status: Full code.  Family Communication: No family at the bedside.  Disposition Plan: Nursing home after discharge (late next week at best); transfer to telemetry today Admission status: Inpatient.   Consultants:   Vascular surgery  Orthopedics  Procedures:     Antimicrobials:  Zosyn and Vanc  started 6/29  Subjective: Pleasant,  conversant, in NAD.  Reports no pain.  Understands that he will have an amputation on Wednesday.  Objective: Filed Vitals:   08/23/15 0354 08/23/15 0800 08/23/15 0900 08/23/15 1200  BP: 125/73 120/63  133/66  Pulse:  81  84  Temp: 97.8 F (36.6 C)  98.7 F (37.1 C)   TempSrc: Oral  Oral   Resp: 16     Height:      Weight: 71 kg (156 lb 8.4 oz)     SpO2: 100% 100%  97%    Intake/Output Summary (Last 24 hours) at 08/23/15 1538 Last data filed at 08/23/15 0914  Gross per 24 hour  Intake    980 ml  Output   1526 ml  Net   -546 ml   Filed Weights   08/21/15 2350 08/22/15 0500 08/23/15 0354  Weight: 73.8 kg (162 lb 11.2 oz) 73.5 kg (162 lb 0.6 oz) 71 kg (156 lb 8.4 oz)    Examination:  General exam: Appears calm and comfortable, disheveled  Respiratory system: Clear to auscultation. Respiratory effort normal. Cardiovascular system: S1 & S2 heard, RRR. No JVD, murmurs, rubs, gallops or clicks. No pedal edema. Gastrointestinal system: Abdomen is nondistended, soft and nontender. No organomegaly or masses felt. Normal bowel sounds heard. Central nervous system: Alert and oriented. No focal neurological deficits. Extremities: s/p R 5th toe amputation, now with dry gangrene to the entire forefoot Skin: No rashes, lesions or ulcers Psychiatry: Judgement and insight appear normal. Mood & affect appropriate.     Data Reviewed: I have personally reviewed following labs and imaging studies  CBC:  Recent Labs Lab 08/20/15 1418 08/21/15 1735 08/22/15 0312  WBC 14.7* 11.9* 10.9*  NEUTROABS  --  9.8* 9.5*  HGB 9.2* 9.1* 9.1*  HCT 28.9* 29.3* 29.5*  MCV 83.8 84.2 85.5  PLT 283 242 330   Basic Metabolic Panel:  Recent Labs Lab 08/20/15 1418 08/21/15 1735 08/22/15 0312  NA 138 141 137  K 4.3 3.9 4.0  CL 101 110 105  CO2 '28 25 25  '$ GLUCOSE 90 109* 172*  BUN 37* 39* 30*  CREATININE 1.08 1.51* 1.05  CALCIUM 9.2 7.8* 8.4*   GFR: Estimated Creatinine Clearance: 75.1  mL/min (by C-G formula based on Cr of 1.05). Liver Function Tests:  Recent Labs Lab 08/21/15 1735 08/22/15 0312  AST 24 25  ALT 21 25  ALKPHOS 48 55  BILITOT 0.6 0.7  PROT 5.3* 6.1*  ALBUMIN 2.0* 2.2*   No results for input(s): LIPASE, AMYLASE in the last 168 hours.  Recent Labs Lab 08/22/15 0413  AMMONIA 32   Coagulation Profile: No results for input(s): INR, PROTIME in the last 168 hours. Cardiac Enzymes: No results for input(s): CKTOTAL, CKMB, CKMBINDEX, TROPONINI in the last 168 hours. BNP (last 3 results) No results for input(s): PROBNP in the last 8760 hours. HbA1C: No results for input(s): HGBA1C in the last 72 hours. CBG:  Recent Labs Lab 08/22/15 1206 08/22/15 1629 08/22/15 2123 08/23/15 0819 08/23/15 1132  GLUCAP 122* 121* 145* 148* 115*   Lipid Profile: No results for input(s): CHOL, HDL, LDLCALC, TRIG, CHOLHDL, LDLDIRECT in the last 72 hours. Thyroid Function Tests: No results for input(s): TSH, T4TOTAL, FREET4, T3FREE, THYROIDAB in the last 72 hours. Anemia Panel: No results for input(s): VITAMINB12, FOLATE, FERRITIN, TIBC, IRON, RETICCTPCT in the last 72 hours. Urine analysis:    Component Value Date/Time   COLORURINE AMBER* 08/21/2015 [place]  08/21/2015 1854   LABSPEC 1.022 08/21/2015 1854   PHURINE 5.5 08/21/2015 [place] 08/21/2015 [place] 08/21/2015 [place] 08/21/2015 [place] 08/21/2015 [place] 08/21/2015 1854   UROBILINOGEN 1.0 09/16/2013 2243   NITRITE NEGATIVE 08/21/2015 1854   LEUKOCYTESUR NEGATIVE 08/21/2015 1854   Sepsis Labs: '@LABRCNTIP'$ (procalcitonin:4,lacticidven:4)  ) Recent Results (from the past 240 hour(s))  Wound or Superficial Culture     Status: None   Collection Time: 08/17/15  9:23 AM  Result Value Ref Range Status   Specimen Description WOUND FOOT RIGHT  Final   Special Requests Normal  Final   Gram Stain    Final    FEW WBC PRESENT, PREDOMINANTLY PMN RARE GRAM POSITIVE COCCOBACILLUS Performed at [facility]    Culture   Final    MODERATE METHICILLIN RESISTANT STAPHYLOCOCCUS AUREUS   Report Status 08/20/2015 FINAL  Final   Organism ID, Bacteria METHICILLIN RESISTANT STAPHYLOCOCCUS AUREUS  Final      Susceptibility   Methicillin resistant staphylococcus aureus - MIC*    CIPROFLOXACIN >=8 RESISTANT Resistant     ERYTHROMYCIN <=0.25 SENSITIVE Sensitive     GENTAMICIN <=0.5 SENSITIVE Sensitive     OXACILLIN >=4 RESISTANT Resistant     TETRACYCLINE 2 SENSITIVE Sensitive     VANCOMYCIN 1 SENSITIVE Sensitive     TRIMETH/SULFA <=10 SENSITIVE Sensitive     CLINDAMYCIN <=0.25 SENSITIVE Sensitive     RIFAMPIN <=0.5 SENSITIVE Sensitive     Inducible Clindamycin NEGATIVE Sensitive     * MODERATE METHICILLIN RESISTANT STAPHYLOCOCCUS AUREUS  Urine culture     Status: None   Collection Time: 08/21/15  6:54 PM  Result Value Ref Range Status   Specimen Description URINE, CLEAN CATCH  Final   Special Requests NONE  Final   Culture NO GROWTH  Final   Report Status 08/23/2015 FINAL  Final  Blood culture (routine x 2)     Status: None (Preliminary result)   Collection Time: 08/21/15  7:09 PM  Result Value Ref Range Status   Specimen Description BLOOD RIGHT HAND  Final   Special Requests IN PEDIATRIC BOTTLE 5CC  Final   Culture NO GROWTH < 24 HOURS  Final   Report Status PENDING  Incomplete  Blood culture (routine x 2)     Status: None (Preliminary result)   Collection Time: 08/21/15  7:53 PM  Result Value Ref Range Status   Specimen Description BLOOD RIGHT HAND  Final   Special Requests IN PEDIATRIC BOTTLE 1CC  Final   Culture NO GROWTH < 24 HOURS  Final   Report Status PENDING  Incomplete  MRSA PCR Screening     Status: None   Collection Time: 08/22/15 12:10 AM  Result Value Ref Range Status   MRSA by PCR NEGATIVE NEGATIVE Final    Comment:        The GeneXpert MRSA Assay  (FDA approved for NASAL specimens only), is one component of a comprehensive MRSA colonization surveillance program. It is not intended to diagnose MRSA infection nor to guide or monitor treatment for MRSA infections.   Surgical PCR screen     Status: None   Collection Time: 08/23/15  6:06 AM  Result Value Ref Range Status   MRSA, PCR NEGATIVE NEGATIVE Final   Staphylococcus aureus NEGATIVE NEGATIVE Final    Comment:        The Xpert SA Assay (FDA approved for NASAL specimens  in patients over 52 years of age), is one component of a comprehensive surveillance program.  Test performance has been validated by [facility] for patients greater than or equal to 28 year old. It is not intended to diagnose infection nor to guide or monitor treatment.          Radiology Studies: Ct Head Wo Contrast  08/21/2015  CLINICAL DATA:  Acute mental status change. EXAM: CT HEAD WITHOUT CONTRAST TECHNIQUE: Contiguous axial images were obtained from the base of the skull through the vertex without intravenous contrast. COMPARISON:  CT of the brain June 10, 2009 FINDINGS: Paranasal sinuses, mastoid air cells, and bones are normal. Mild soft tissue swelling over the posterior calvarium near the vertex. Recommend clinical correlation. Extracranial soft tissues are otherwise within normal limits. No subdural, epidural, or subarachnoid hemorrhage. The cerebellum, brainstem, and basal cisterns are within normal limits. Ventricles and sulci are unremarkable. No acute cortical ischemia or infarct. No mass, mass effect, or midline shift. IMPRESSION: No acute intracranial process. Electronically Signed   By: Dorise Bullion III M.D   On: 08/21/2015 23:07   Dg Chest Port 1 View  08/21/2015  CLINICAL DATA:  Pt [facility] via [place]. Pt was seen at [facility] yesterday for generalized weakness, transported out today for same. Pt was hypotensive on scene and remains that way upon arrival. Diaphoretic. EXAM:  PORTABLE CHEST 1 VIEW COMPARISON:  04/24/2015 FINDINGS: Heart size is within normal limits. There is minimal density at the left lung base which may represent residual atelectasis or scarring. Overall the appearance is improved since the prior study. The right lung is clear. No pulmonary edema. IMPRESSION: 1. Minimal left lower lobe density, which may represent chronic atelectasis or infiltrate. 2. Is hard to entirely exclude a recurrent left lower lobe infectious process. Electronically Signed   By: Nolon Nations M.D.   On: 08/21/2015 16:15   Dg Foot Complete Right  08/21/2015  CLINICAL DATA:  RIGHT foot pain for an unknown period of time, unresponsive, diabetes mellitus, hypertension, peripheral vascular disease EXAM: RIGHT FOOT COMPLETE - 3+ VIEW COMPARISON:  04/02/2015 FINDINGS: Marked osseous demineralization. Joint spaces preserved. Dressing artifacts at the amputation bed from fifth toe amputation. Bone destruction of the fifth metatarsal head consistent with osteomyelitis. No fracture, dislocation, or additional bone destruction. Scattered soft tissue swelling RIGHT foot along the lateral and dorsal margins. IMPRESSION: Prior amputation of the fifth toe with destruction of the fifth metatarsal head consistent with osteomyelitis. Osseous demineralization. Electronically Signed   By: Lavonia Dana M.D.   On: 08/21/2015 17:12        Scheduled Meds: . cloZAPine  100 mg Oral 2 times per day  . cloZAPine  400 mg Oral QHS  . donepezil  5 mg Oral QHS  . haloperidol  5 mg Oral QHS  . insulin aspart  0-9 Units Subcutaneous TID WC  . lactulose  30 g Oral BID  . metoprolol tartrate  25 mg Oral BID  . pantoprazole  40 mg Oral Daily  . piperacillin-tazobactam (ZOSYN)  IV  3.375 g Intravenous Q8H  . simvastatin  5 mg Oral QHS  . trihexyphenidyl  5 mg Oral BID WC  . vancomycin  750 mg Intravenous Q12H   Continuous Infusions:    LOS: 2 days    Time spent: 35 minutes    Karmen Bongo,  MD [facility]   If 7PM-7AM, please contact night-coverage [email or web address] Password [facility] 08/23/2015, 3:38 PM

## 2015-08-24 LAB — GLUCOSE, CAPILLARY
GLUCOSE-CAPILLARY: 154 mg/dL — AB (ref 65–99)
GLUCOSE-CAPILLARY: 138 mg/dL — AB (ref 65–99)
Glucose-Capillary: 179 mg/dL — ABNORMAL HIGH (ref 65–99)
Glucose-Capillary: 149 mg/dL — ABNORMAL HIGH (ref 65–99)

## 2015-08-24 LAB — VANCOMYCIN, TROUGH: VANCOMYCIN TR: 9 ug/mL — AB (ref 15–20)

## 2015-08-24 MED ORDER — VANCOMYCIN HCL 10 G IV SOLR
1500.0000 mg | Freq: Two times a day (BID) | INTRAVENOUS | Status: DC
  Administered 2015-08-25 – 2015-08-28 (×7): 1500 mg via INTRAVENOUS
  Filled 2015-08-24 (×9): qty 1500

## 2015-08-24 NOTE — Progress Notes (Signed)
Pharmacy Antibiotic Note  Craig Bentley is a 60 y.o. male admitted on 08/21/2015 with sepsis.  Pharmacy has been consulted for vancomycin and zosyn dosing.   Tr = 9   Plan: Increase vanc to 1500 q12h  VT at SS prn  Height: 5' 11.5" (181.6 cm) Weight: 160 lb 4.4 oz (72.7 kg) IBW/kg (Calculated) : 76.45  Temp (24hrs), Avg:98.5 F (36.9 C), Min:97.7 F (36.5 C), Max:99.3 F (37.4 C)   Recent Labs Lab 08/20/15 1418 08/21/15 1735 08/21/15 1742 08/22/15 0005 08/22/15 0312 08/23/15 1507 08/24/15 1817  WBC 14.7* 11.9*  --   --  10.9* 7.0  --   CREATININE 1.08 1.51*  --   --  1.05 0.88  --   LATICACIDVEN  --   --  1.21 1.0 0.8  --   --   VANCOTROUGH  --   --   --   --   --   --  9*    Estimated Creatinine Clearance: 91.8 mL/min (by C-G formula based on Cr of 0.88).    No Known Allergies  Antimicrobials this admission: Vanc 6/29 >>  Zosyn 6/29 >>   Dose adjustments this admission: VT = 9 750 q12  Microbiology results:  BCx:   UCx:    Sputum:    MRSA PCR:   Levester Fresh, PharmD, BCPS, [facility] Clinical Pharmacist Pager [phone] 08/24/2015 7:29 PM

## 2015-08-24 NOTE — Progress Notes (Signed)
PROGRESS NOTE   Craig Bentley FXT:[id number] DOB: 11-05-1955 DOA: 08/21/2015 PCP: Mabeline Caras, NP Outpatient Specialists:    Brief Narrative:  HPI: Craig Bentley is a 60 y.o. male with peripheral vascular disease status post aorto femoral bypass and right foot fifth toe amputation in March 2017, hypertension, diabetes mellitus, hyperlipidemia and COPD was brought to the ER, the patient was found to be confused at the living facility. Patient had come to the ER yesterday for foot pain and at that time also patient was found to be confused and patient's fentanyl patch was discontinued. Today in the ER patient was found to be hypotensive febrile with temperatures around 102F tachycardic mild leukocytosis with x-ray of the right foot shows features concerning for osteomyelitis. Patient was given 1 dose of Narcan in the ER following which patient became more alert awake. On exam patient has pinpoint pupils. By the time I examined patient patient has become more alert and awake. Follows commands. Does complain of pain in the right foot. Denies any chest pain or shortness of breath. Vascular surgeon Dr. Trula Slade has seen the patient in consult and patient will be admitted for further management of sepsis probably from osteomyelitis right foot.   Assessment & Plan:  Principal Problem:  Sepsis ([place]) Active Problems:  Essential hypertension  COPD (chronic obstructive pulmonary disease) ([facility])  Diabetes mellitus with neurologic complication, without long-term current use of insulin ([facility])  PVD (peripheral vascular disease) ([place])  Schizophrenia ([place])  ARF (acute renal failure) ([place])  Acute encephalopathy    Sepsis from osteomyelitis - On admission patient had tachycardia fever hypotension and metcriteria for sepsis. Blood pressure improved with fluid bolus. Since admission, 1 fever to 102.5. BP good since initial bolus. Tachycardia resolved.Transferred to  med-surg and stable.  Osteomyelitis of the right foot - appreciate vascular surgery and orthopedics consults. Patient well known to vascular service from prior admission with R foot gangrene, s/p ABF, R CFA to PTA with composite graft, L CFA To SFA BPG w/ ips reversed GSV, R 5th toe amputation (Date: 04/25/15). Patient has been placed on vancomycin and Zosyn. Likely to need R BKA vs. AKA as per vasc surgery - Dr. Sharol Given consulted. Dr. Sharol Given suggests transtibial amputation (possibly AKA if unsuccessful) - he will plan for surgery on Wednesday.  Acute encephalopathy - possibly related to pain medications. Resolved. Resumed gabapentin. Patient's fentanyl was discontinued 2 days prior to admission. CT head is unremarkable. Ammonia level 32 (WNL).  Acute renal failure probably from hypotension and patient being on ACE inhibitor - hold off ACE inhibitor and hydrochlorothiazide and continue hydration. Now resolved, creatinine 0.88.  Hypertension - since patient was hypotensive on presentation, antihypertensives other than beta blockers were held. When necessary IV hydralazine for systolic blood pressure more than 160.  Good control, so no resumption at this time.  Diabetes mellitus type 2 - hold metformin while inpatient and keep patient on sliding scale coverage.  History of schizophrenia - on Haldol clozapine and trihexyphenidyl.  COPD - presently not wheezing continue inhalers.  Peripheral vascular disease status post aortofemoral bypass - continue aspirin and statins.     DVT prophylaxis: SCDs in anticipation of procedure. Code Status: Full code.  Family Communication: Discussed with family today.  Please continue to update sister, Linward Natal, [phone], with changes in clinical status.  Disposition Plan: Nursing home after discharge (late next week at best) Admission status: Inpatient.   Consultants:   Vascular surgery  Orthopedics  Procedures:     Antimicrobials:  Zosyn and  Vanc started 6/29  Subjective: No new concerns but sister and brother present today and had not been notified of admission.  Patient requests that sister be updated with clinical changes, Linward Natal, [phone].  Objective: Filed Vitals:   08/23/15 2224 08/24/15 0500 08/24/15 0549 08/24/15 1400  BP: 129/69  131/69 135/68  Pulse: 79  73 81  Temp: 99.3 F (37.4 C)  97.7 F (36.5 C) 98.5 F (36.9 C)  TempSrc: Oral  Oral   Resp: '16  16 18  '$ Height:      Weight:  72.7 kg (160 lb 4.4 oz)    SpO2: 98%  100% 100%    Intake/Output Summary (Last 24 hours) at 08/24/15 1625 Last data filed at 08/24/15 1309  Gross per 24 hour  Intake   1000 ml  Output    925 ml  Net     75 ml   Filed Weights   08/22/15 0500 08/23/15 0354 08/24/15 0500  Weight: 73.5 kg (162 lb 0.6 oz) 71 kg (156 lb 8.4 oz) 72.7 kg (160 lb 4.4 oz)    Examination:  General exam: Appears calm and comfortable, gowned with food particles on it  Respiratory system: Clear to auscultation. Respiratory effort normal. Cardiovascular system: S1 & S2 heard, RRR. No JVD, murmurs, rubs, gallops or clicks. No pedal edema. Gastrointestinal system: Abdomen is nondistended, soft and nontender. No organomegaly or masses felt. Normal bowel sounds heard. Central nervous system: Alert and oriented. No focal neurological deficits. Extremities: s/p R 5th toe amputation, now with dry gangrene to the entire forefoot, +odor today Skin: No rashes, lesions or ulcers Psychiatry: Oriented and appropriate    Data Reviewed: I have personally reviewed following labs and imaging studies  CBC:  Recent Labs Lab 08/20/15 1418 08/21/15 1735 08/22/15 0312 08/23/15 1507  WBC 14.7* 11.9* 10.9* 7.0  NEUTROABS  --  9.8* 9.5*  --   HGB 9.2* 9.1* 9.1* 9.6*  HCT 28.9* 29.3* 29.5* 31.0*  MCV 83.8 84.2 85.5 84.0  PLT 283 242 265 458   Basic Metabolic Panel:  Recent Labs Lab 08/20/15 1418 08/21/15 1735 08/22/15 0312 08/23/15 1507  NA 138  141 137 137  K 4.3 3.9 4.0 3.9  CL 101 110 105 103  CO2 '28 25 25 27  '$ GLUCOSE 90 109* 172* 149*  BUN 37* 39* 30* 10  CREATININE 1.08 1.51* 1.05 0.88  CALCIUM 9.2 7.8* 8.4* 8.9   GFR: Estimated Creatinine Clearance: 91.8 mL/min (by C-G formula based on Cr of 0.88). Liver Function Tests:  Recent Labs Lab 08/21/15 1735 08/22/15 0312  AST 24 25  ALT 21 25  ALKPHOS 48 55  BILITOT 0.6 0.7  PROT 5.3* 6.1*  ALBUMIN 2.0* 2.2*   No results for input(s): LIPASE, AMYLASE in the last 168 hours.  Recent Labs Lab 08/22/15 0413  AMMONIA 32   Coagulation Profile: No results for input(s): INR, PROTIME in the last 168 hours. Cardiac Enzymes: No results for input(s): CKTOTAL, CKMB, CKMBINDEX, TROPONINI in the last 168 hours. BNP (last 3 results) No results for input(s): PROBNP in the last 8760 hours. HbA1C: No results for input(s): HGBA1C in the last 72 hours. CBG:  Recent Labs Lab 08/23/15 1132 08/23/15 1641 08/23/15 2224 08/24/15 0805 08/24/15 1158  GLUCAP 115* 147* 145* 179* 149*   Lipid Profile: No results for input(s): CHOL, HDL, LDLCALC, TRIG, CHOLHDL, LDLDIRECT in the last 72 hours. Thyroid Function Tests: No results for input(s): TSH, T4TOTAL, FREET4, T3FREE, THYROIDAB in  the last 72 hours. Anemia Panel: No results for input(s): VITAMINB12, FOLATE, FERRITIN, TIBC, IRON, RETICCTPCT in the last 72 hours. Urine analysis:    Component Value Date/Time   COLORURINE AMBER* 08/21/2015 [place] 08/21/2015 1854   LABSPEC 1.022 08/21/2015 1854   PHURINE 5.5 08/21/2015 1854   GLUCOSEU NEGATIVE 08/21/2015 1854   HGBUR NEGATIVE 08/21/2015 [place] NEGATIVE 08/21/2015 [place] 08/21/2015 1854   PROTEINUR NEGATIVE 08/21/2015 1854   UROBILINOGEN 1.0 09/16/2013 2243   NITRITE NEGATIVE 08/21/2015 1854   LEUKOCYTESUR NEGATIVE 08/21/2015 1854   Sepsis Labs: '@LABRCNTIP'$ (procalcitonin:4,lacticidven:4)  ) Recent Results (from the past  240 hour(s))  Wound or Superficial Culture     Status: None   Collection Time: 08/17/15  9:23 AM  Result Value Ref Range Status   Specimen Description WOUND FOOT RIGHT  Final   Special Requests Normal  Final   Gram Stain   Final    FEW WBC PRESENT, PREDOMINANTLY PMN RARE GRAM POSITIVE COCCOBACILLUS Performed at [facility]    Culture   Final    MODERATE METHICILLIN RESISTANT STAPHYLOCOCCUS AUREUS   Report Status 08/20/2015 FINAL  Final   Organism ID, Bacteria METHICILLIN RESISTANT STAPHYLOCOCCUS AUREUS  Final      Susceptibility   Methicillin resistant staphylococcus aureus - MIC*    CIPROFLOXACIN >=8 RESISTANT Resistant     ERYTHROMYCIN <=0.25 SENSITIVE Sensitive     GENTAMICIN <=0.5 SENSITIVE Sensitive     OXACILLIN >=4 RESISTANT Resistant     TETRACYCLINE 2 SENSITIVE Sensitive     VANCOMYCIN 1 SENSITIVE Sensitive     TRIMETH/SULFA <=10 SENSITIVE Sensitive     CLINDAMYCIN <=0.25 SENSITIVE Sensitive     RIFAMPIN <=0.5 SENSITIVE Sensitive     Inducible Clindamycin NEGATIVE Sensitive     * MODERATE METHICILLIN RESISTANT STAPHYLOCOCCUS AUREUS  Urine culture     Status: None   Collection Time: 08/21/15  6:54 PM  Result Value Ref Range Status   Specimen Description URINE, CLEAN CATCH  Final   Special Requests NONE  Final   Culture NO GROWTH  Final   Report Status 08/23/2015 FINAL  Final  Blood culture (routine x 2)     Status: None (Preliminary result)   Collection Time: 08/21/15  7:09 PM  Result Value Ref Range Status   Specimen Description BLOOD RIGHT HAND  Final   Special Requests IN PEDIATRIC BOTTLE 5CC  Final   Culture NO GROWTH 2 DAYS  Final   Report Status PENDING  Incomplete  Blood culture (routine x 2)     Status: None (Preliminary result)   Collection Time: 08/21/15  7:53 PM  Result Value Ref Range Status   Specimen Description BLOOD RIGHT HAND  Final   Special Requests IN PEDIATRIC BOTTLE 1CC  Final   Culture NO GROWTH 2 DAYS  Final   Report Status  PENDING  Incomplete  MRSA PCR Screening     Status: None   Collection Time: 08/22/15 12:10 AM  Result Value Ref Range Status   MRSA by PCR NEGATIVE NEGATIVE Final    Comment:        The GeneXpert MRSA Assay (FDA approved for NASAL specimens only), is one component of a comprehensive MRSA colonization surveillance program. It is not intended to diagnose MRSA infection nor to guide or monitor treatment for MRSA infections.   Surgical PCR screen     Status: None   Collection Time: 08/23/15  6:06 AM  Result Value  Ref Range Status   MRSA, PCR NEGATIVE NEGATIVE Final   Staphylococcus aureus NEGATIVE NEGATIVE Final    Comment:        The Xpert SA Assay (FDA approved for NASAL specimens in patients over 47 years of age), is one component of a comprehensive surveillance program.  Test performance has been validated by [facility] for patients greater than or equal to 47 year old. It is not intended to diagnose infection nor to guide or monitor treatment.          Radiology Studies: No results found.      Scheduled Meds: . cloZAPine  100 mg Oral 2 times per day  . cloZAPine  400 mg Oral QHS  . donepezil  5 mg Oral QHS  . gabapentin  300 mg Oral TID  . haloperidol  5 mg Oral QHS  . insulin aspart  0-9 Units Subcutaneous TID WC  . lactulose  30 g Oral BID  . metoprolol tartrate  25 mg Oral BID  . pantoprazole  40 mg Oral Daily  . piperacillin-tazobactam (ZOSYN)  IV  3.375 g Intravenous Q8H  . simvastatin  5 mg Oral QHS  . trihexyphenidyl  5 mg Oral BID WC  . vancomycin  750 mg Intravenous Q12H   Continuous Infusions:    LOS: 3 days    Time spent: 25 minutes    Karmen Bongo, MD [facility]   If 7PM-7AM, please contact night-coverage [email or web address] Password [facility] 08/24/2015, 4:25 PM

## 2015-08-25 ENCOUNTER — Other Ambulatory Visit (HOSPITAL_COMMUNITY): Payer: Self-pay | Admitting: Family

## 2015-08-25 DIAGNOSIS — A4102 Sepsis due to Methicillin resistant Staphylococcus aureus: Secondary | ICD-10-CM

## 2015-08-25 LAB — GLUCOSE, CAPILLARY
Glucose-Capillary: 108 mg/dL — ABNORMAL HIGH (ref 65–99)
Glucose-Capillary: 156 mg/dL — ABNORMAL HIGH (ref 65–99)
Glucose-Capillary: 110 mg/dL — ABNORMAL HIGH (ref 65–99)

## 2015-08-25 MED ORDER — CEFAZOLIN SODIUM-DEXTROSE 2-4 GM/100ML-% IV SOLN
2.0000 g | INTRAVENOUS | Status: AC
  Administered 2015-08-27: 2 g via INTRAVENOUS
  Filled 2015-08-25 (×2): qty 100

## 2015-08-25 NOTE — Progress Notes (Addendum)
Patient ID: Craig Bentley, male   DOB: September 10, 1955, 60 y.o.   MRN: [id number]  PROGRESS NOTE    Craig Bentley  AUQ:[id number] DOB: Jul 13, 1955 DOA: 08/21/2015  PCP: Mabeline Caras, NP   Brief Narrative:  60 y.o. male with past medical history significant for peripheral vascular disease status post aorto femoral bypass and right foot fifth toe amputation in March 2017, hypertension, diabetes mellitus, hyperlipidemia and COPD. Patient was brought from his living facility to ER because of more confusion felt to be because of fentanyl patch which was subsequently discontinued. In ER, patient was found to be hypotensive, febrile with Tmax 102F, tachycardic in addition to having leukocytosis and x-ray findings of right foot possible osteomyelitis. Patient was seen by vascular surgery as well as orthopedic surgery. Plan is for transtibial amputation 08/26/2012.   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 X-ray on the admission showed possible right foot osteomyelitis. - Patient seen by vascular surgery as well as orthopedic surgery - Culture from the wound significant for MRSA - Blood cultures negative so far - Continue vancomycin and Zosyn  Acute encephalopathy - [place] in the setting of osteomyelitis - Mental status improving, sleeping this am but wakes up when called his name   Controlled diabetes mellitus with peripheral vascular disease and circulatory complications without long-term insulin use - A1c in March 2017 was 6.3 indicating good glycemic control - Patient is on metformin at home but currently on sliding scale insulin  Diabetic neuropathy - Continue gabapentin  Dyslipidemia associated with type 2 diabetes mellitus - Continue simvastatin 5 mg at bedtime  History of schizophrenia - Continue  clozapine, Aricept, haloperidol  Peripheral vascular disease status post aortofemoral bypass - R CFA to PTA with composite graft, L CFA To SFA BPG w/ ips reversed GSV, R 5th toe amputation (Date: 04/25/15) - Surgery following   Acute renal failure - Likely related to sepsis - Creatinine within normal limits, likely improved with IV fluid  Essential hypertension - Continue metoprolol 25 mg twice daily    DVT prophylaxis: SCDs bilaterally Code Status: full code  Family Communication: No family at the bedside Disposition Plan: Plan for surgery Wednesday   Consultants:   Orthopedic surgery, Dr. Sharol Given  Vascular surgery   Procedures:   None   Antimicrobials:   Zosyn and vanco 08/21/2015 -->   Subjective: No overnight events.   Objective: Filed Vitals:   08/24/15 0549 08/24/15 1400 08/24/15 2151 08/25/15 0512  BP: 131/69 135/68 137/64 104/55  Pulse: 73 81 87 93  Temp: 97.7 F (36.5 C) 98.5 F (36.9 C) 99.4 F (37.4 C) 97.5 F (36.4 C)  TempSrc: Oral  Oral Oral  Resp: _0 Height:      Weight:    71.7 kg (158 lb 1.1 oz)  SpO2: 100% 100% 100% 98%    Intake/Output Summary (Last 24 hours) at 08/25/15 0835 Last data filed at 08/25/15 0647  Gross per 24 hour  Intake   1510 ml  Output    925 ml  Net    585 ml   Filed Weights   08/23/15 0354 08/24/15 0500 08/25/15 0512  Weight: 71 kg (156 lb 8.4 oz) 72.7 kg (160 lb 4.4 oz) 71.7 kg (158 lb 1.1 oz)    Examination:  General exam: Appears calm and comfortable  Respiratory system: Clear to auscultation. Respiratory effort normal. Cardiovascular system: S1 & S2 heard, RRR. No JVD Gastrointestinal system: Abdomen is nondistended, soft and nontender. No organomegaly or masses felt. Normal bowel sounds heard. Central nervous system: Alert and oriented. No focal neurological deficits. Extremities: right 5th toe amputation, gangrene forefoot  Skin: No rashes, lesions or ulcers Psychiatry: Judgement and insight  appear normal. Mood & affect appropriate.   Data Reviewed: I have personally reviewed following labs and imaging studies  CBC:  Recent Labs Lab 08/20/15 1418 08/21/15 1735 08/22/15 0312 08/23/15 1507  WBC 14.7* 11.9* 10.9* 7.0  NEUTROABS  --  9.8* 9.5*  --   HGB 9.2* 9.1* 9.1* 9.6*  HCT 28.9* 29.3* 29.5* 31.0*  MCV 83.8 84.2 85.5 84.0  PLT 283 242 265 975   Basic Metabolic Panel:  Recent Labs Lab 08/20/15 1418 08/21/15 1735 08/22/15 0312 08/23/15 1507  NA 138 141 137 137  K 4.3 3.9 4.0 3.9  CL 101 110 105 103  CO2 _0 GLUCOSE 90 109* 172* 149*  BUN 37* 39* 30* 10  CREATININE 1.08 1.51* 1.05 0.88  CALCIUM 9.2 7.8* 8.4* 8.9   GFR: Estimated Creatinine Clearance: 90.5 mL/min (by C-G formula based on Cr of 0.88). Liver Function Tests:  Recent Labs Lab 08/21/15 1735 08/22/15 0312  AST 24 25  ALT 21 25  ALKPHOS 48 55  BILITOT 0.6 0.7  PROT 5.3* 6.1*  ALBUMIN 2.0* 2.2*   No results for input(s): LIPASE, AMYLASE in the last 168 hours.  Recent Labs Lab 08/22/15 0413  AMMONIA 32   Coagulation Profile: No results for input(s): INR, PROTIME in the last 168 hours. Cardiac Enzymes: No results for input(s): CKTOTAL, CKMB, CKMBINDEX, TROPONINI in the last 168 hours. BNP (last 3 results) No results for input(s): PROBNP in the last 8760 hours. HbA1C: No results for input(s): HGBA1C in the last 72 hours. CBG:  Recent Labs Lab 08/24/15 0805 08/24/15 1158 08/24/15 1703 08/24/15 2153 08/25/15 0746  GLUCAP 179* 149* 154* 138* 108*   Lipid Profile: No results for input(s): CHOL, HDL, LDLCALC, TRIG, CHOLHDL, LDLDIRECT in the last 72 hours. Thyroid Function Tests: No results for input(s): TSH, T4TOTAL, FREET4, T3FREE, THYROIDAB in the last 72 hours. Anemia Panel: No results for input(s): VITAMINB12, FOLATE, FERRITIN, TIBC, IRON, RETICCTPCT in the last 72 hours. Urine analysis:    Component Value Date/Time   COLORURINE AMBER* 08/21/2015 [place] 08/21/2015 1854   LABSPEC 1.022 08/21/2015 1854   PHURINE 5.5 08/21/2015 1854   GLUCOSEU NEGATIVE 08/21/2015 1854   HGBUR NEGATIVE 08/21/2015 1854   BILIRUBINUR NEGATIVE 08/21/2015 [place] 08/21/2015 1854   PROTEINUR NEGATIVE 08/21/2015 1854   UROBILINOGEN 1.0 09/16/2013 2243   NITRITE NEGATIVE 08/21/2015 1854   LEUKOCYTESUR NEGATIVE 08/21/2015 1854   Sepsis Labs: _1 (procalcitonin:4,lacticidven:4)   Wound or Superficial Culture     Status: None   Collection Time: 08/17/15  9:23 AM  Result Value Ref Range Status   Specimen Description WOUND FOOT RIGHT  Final   Special Requests Normal  Final    MODERATE METHICILLIN RESISTANT STAPHYLOCOCCUS AUREUS   Report Status 08/20/2015 FINAL  Final   Organism ID, Bacteria METHICILLIN RESISTANT STAPHYLOCOCCUS AUREUS  Final      Susceptibility   Methicillin resistant staphylococcus aureus - MIC*    CIPROFLOXACIN >=8 RESISTANT Resistant     ERYTHROMYCIN <=0.25 SENSITIVE Sensitive     GENTAMICIN <=0.5 SENSITIVE Sensitive  OXACILLIN >=4 RESISTANT Resistant     TETRACYCLINE 2 SENSITIVE Sensitive     VANCOMYCIN 1 SENSITIVE Sensitive     TRIMETH/SULFA <=10 SENSITIVE Sensitive     CLINDAMYCIN <=0.25 SENSITIVE Sensitive     RIFAMPIN <=0.5 SENSITIVE Sensitive     Inducible Clindamycin NEGATIVE Sensitive     * MODERATE METHICILLIN RESISTANT STAPHYLOCOCCUS AUREUS  Urine culture     Status: None   Collection Time: 08/21/15  6:54 PM  Result Value Ref Range Status   Specimen Description URINE, CLEAN CATCH  Final   Special Requests NONE  Final   Culture NO GROWTH  Final   Report Status 08/23/2015 FINAL  Final  Blood culture (routine x 2)     Status: None (Preliminary result)   Collection Time: 08/21/15  7:09 PM  Result Value Ref Range Status   Specimen Description BLOOD RIGHT HAND  Final   Special Requests IN PEDIATRIC BOTTLE 5CC  Final   Culture NO GROWTH 3 DAYS  Final   Report Status PENDING   Incomplete  Blood culture (routine x 2)     Status: None (Preliminary result)   Collection Time: 08/21/15  7:53 PM  Result Value Ref Range Status   Specimen Description BLOOD RIGHT HAND  Final   Special Requests IN PEDIATRIC BOTTLE 1CC  Final   Culture NO GROWTH 3 DAYS  Final   Report Status PENDING  Incomplete  MRSA PCR Screening     Status: None   Collection Time: 08/22/15 12:10 AM  Result Value Ref Range Status   MRSA by PCR NEGATIVE NEGATIVE Final  Surgical PCR screen     Status: None   Collection Time: 08/23/15  6:06 AM  Result Value Ref Range Status   MRSA, PCR NEGATIVE NEGATIVE Final   Staphylococcus aureus NEGATIVE NEGATIVE Final      Radiology Studies: Ct Head Wo Contrast 08/21/2015   No acute intracranial process.   Dg Chest Port 1 View 08/21/2015   1. Minimal left lower lobe density, which may represent chronic atelectasis or infiltrate. 2. Is hard to entirely exclude a recurrent left lower lobe infectious process.   Dg Foot Complete Right 08/21/2015  Prior amputation of the fifth toe with destruction of the fifth metatarsal head consistent with osteomyelitis. Osseous demineralization.     Scheduled Meds: . cloZAPine  100 mg Oral 2 times per day  . cloZAPine  400 mg Oral QHS  . donepezil  5 mg Oral QHS  . gabapentin  300 mg Oral TID  . haloperidol  5 mg Oral QHS  . insulin aspart  0-9 Units Subcutaneous TID WC  . lactulose  30 g Oral BID  . metoprolol tartrate  25 mg Oral BID  . pantoprazole  40 mg Oral Daily  . piperacillin-tazobactam (ZOSYN)  IV  3.375 g Intravenous Q8H  . simvastatin  5 mg Oral QHS  . trihexyphenidyl  5 mg Oral BID WC  . vancomycin  1,500 mg Intravenous Q12H   Continuous Infusions:    LOS: 4 days    Time spent: 25 minutes  Greater than 50% of the time spent on counseling and coordinating the care.   Leisa Lenz, MD [facility] Pager [phone]  If 7PM-7AM, please contact night-coverage [email or web address] Password  [name] 08/25/2015, 8:35 AM

## 2015-08-26 LAB — BASIC METABOLIC PANEL
Anion gap: 7 (ref 5–15)
BUN: 10 mg/dL (ref 6–20)
CHLORIDE: 102 mmol/L (ref 101–111)
CO2: 28 mmol/L (ref 22–32)
CREATININE: 0.67 mg/dL (ref 0.61–1.24)
Calcium: 9.3 mg/dL (ref 8.9–10.3)
Glucose, Bld: 118 mg/dL — ABNORMAL HIGH (ref 65–99)
POTASSIUM: 4.2 mmol/L (ref 3.5–5.1)
SODIUM: 137 mmol/L (ref 135–145)

## 2015-08-26 LAB — CULTURE, BLOOD (ROUTINE X 2)
CULTURE: NO GROWTH
Culture: NO GROWTH

## 2015-08-26 LAB — GLUCOSE, CAPILLARY
GLUCOSE-CAPILLARY: 110 mg/dL — AB (ref 65–99)
GLUCOSE-CAPILLARY: 134 mg/dL — AB (ref 65–99)
GLUCOSE-CAPILLARY: 144 mg/dL — AB (ref 65–99)
Glucose-Capillary: 130 mg/dL — ABNORMAL HIGH (ref 65–99)

## 2015-08-26 LAB — VANCOMYCIN, TROUGH: VANCOMYCIN TR: 15 ug/mL (ref 15–20)

## 2015-08-26 LAB — SURGICAL PCR SCREEN
MRSA, PCR: POSITIVE — AB
STAPHYLOCOCCUS AUREUS: POSITIVE — AB

## 2015-08-26 MED ORDER — TRAMADOL HCL 50 MG PO TABS: 50.0000 mg | ORAL_TABLET | Freq: Four times a day (QID) | ORAL | Status: DC | PRN

## 2015-08-26 MED ORDER — OXYCODONE-ACETAMINOPHEN 5-325 MG PO TABS
1.0000 | ORAL_TABLET | Freq: Four times a day (QID) | ORAL | Status: DC | PRN
  Administered 2015-08-26 – 2015-08-27 (×2): 1 via ORAL
  Filled 2015-08-26 (×2): qty 1

## 2015-08-26 NOTE — Progress Notes (Signed)
Pharmacy Antibiotic Note  Craig Bentley is a 60 y.o. male admitted on 08/21/2015 with sepsis.  Pharmacy has been consulted for vancomycin and zosyn dosing.   VT=15 on '1500mg'$  q12h  Plan: Continue vancomycin '1500mg'$  q12h Continue Zosyn 3.375g q8h  Height: 5' 11.5" (181.6 cm) Weight: 158 lb 1.1 oz (71.7 kg) IBW/kg (Calculated) : 76.45  Temp (24hrs), Avg:98.9 F (37.2 C), Min:98.7 F (37.1 C), Max:99 F (37.2 C)   Recent Labs Lab 08/20/15 1418 08/21/15 1735 08/21/15 1742 08/22/15 0005 08/22/15 0312 08/23/15 1507 08/24/15 1817 08/26/15 0529 08/26/15 1813  WBC 14.7* 11.9*  --   --  10.9* 7.0  --   --   --   CREATININE 1.08 1.51*  --   --  1.05 0.88  --  0.67  --   LATICACIDVEN  --   --  1.21 1.0 0.8  --   --   --   --   VANCOTROUGH  --   --   --   --   --   --  9*  --  15    Estimated Creatinine Clearance: 99.6 mL/min (by C-G formula based on Cr of 0.67).    No Known Allergies  Antimicrobials this admission: Vanc 6/29 >>  Zosyn 6/29 >>   Dose adjustments this admission: VT = 9 750 q12 VT = 15 '1500mg'$  q12h  Microbiology results:  BCx 6/29: NGF  UCx 6/29:  NGF  MRSA PCR: Pos  Stephens November, PharmD Clinical Pharmacist  7:29 PM, 08/26/2015

## 2015-08-26 NOTE — Progress Notes (Signed)
PROGRESS NOTE  Eunice Oldaker ZOX:[id number] DOB: 08-23-1955 DOA: 08/21/2015 PCP: Mabeline Caras, NP  HPI/Recap of past 24 hours: 60 y.o. male with past medical history significant for peripheral vascular disease status post aorto femoral bypass and right foot fifth toe amputation in March 2017, hypertension, diabetes mellitus, hyperlipidemia and COPD. Patient was brought from his living facility to ER because of more confusion felt to be because of fentanyl patch which was subsequently discontinued. In ER, patient was found to be hypotensive, febrile with Tmax 102F, tachycardic in addition to having leukocytosis and x-ray findings of right foot possible osteomyelitis. Patient was seen by vascular surgery as well as orthopedic surgery. Plan is for transtibial amputation 08/26/2012.  No events overnight. Patient doing well with no complaints. Denies any pain. Breathing comfortably.   Assessment/Plan: Principal Problem: Sepsis secondary to right foot MRSA diabetic osteomyelitis: Patient met criteria for sepsis on admission given fever, tachycardia and hypotension as well as source being infection seen on plain films. Sepsis since stabilized with IV fluids and antibiotics. Seen by vascular orthopedic surgery with wound culture positive for MRSA. Blood cultures no growth to date. On vancomycin and Zosyn. Plan is for transtibial amputation 7/5.  Active Problems:   Essential hypertension: Stable. On metoprolol 25 twice a day.    COPD (chronic obstructive pulmonary disease) ([place]): Stable. Breathing comfortably on room air.    Diabetes mellitus with neurologic complication, without long-term current use of insulin ([place]): CBG stable, under 180. Continue Neurontin.    PVD (peripheral vascular disease) ([place]) status post aortofemoral bypass:- R CFA to PTA with composite graft, L CFA To SFA BPG w/ ips reversed GSV, R 5th toe amputation (Date: 04/25/15) - Surgery following     Schizophrenia ([place]): Stable.  Patient on Aricept, Haldol and clozapine.    ARF (acute renal failure) (HCC)/acute kidney injury: Stable. Creatinine on admission at 1.51, now normalized with IV fluids. Secondary to initial sepsis.    Acute encephalopathy: Likely in the setting of osteomyelitis. Has since resolved   Code Status: Full code   Family Communication: Left message for family and   Disposition Plan: Surgery tomorrow, suspect will need skilled nursing at end of week or beginning of next    Consultants:   Orthopedic surgery, Dr. Sharol Given  Vascular surgery  Procedures:   Plan to transtibial amputation of right leg 7/5  Antimicrobials:   Zosyn and vanco 08/21/2015 -->  DVT prophylaxis:  SCDs   Objective: Filed Vitals:   08/25/15 1254 08/25/15 2158 08/26/15 0525 08/26/15 0812  BP: 108/60 114/55 108/53 118/56  Pulse: 81 78 76 71  Temp: 98.4 F (36.9 C) 99 F (37.2 C) 98.9 F (37.2 C)   TempSrc:  Oral    Resp: '22 16 17   ' Height:      Weight:      SpO2: 96% 94% 98%     Intake/Output Summary (Last 24 hours) at 08/26/15 1025 Last data filed at 08/26/15 0701  Gross per 24 hour  Intake   1030 ml  Output   2150 ml  Net  -1120 ml   Filed Weights   08/23/15 0354 08/24/15 0500 08/25/15 0512  Weight: 71 kg (156 lb 8.4 oz) 72.7 kg (160 lb 4.4 oz) 71.7 kg (158 lb 1.1 oz)    Exam:   General:  Alert and oriented 3, no acute distress   Cardiovascular: Regular rate and rhythm, S1-S2   Respiratory: Clear to auscultation bilaterally   Abdomen: Soft, nontender, nondistended, positive bowel sounds  Musculoskeletal: No clubbing or cyanosis or edema. Noted right forefoot with some gangrene, status post right fifth toe amputation  Skin: No skin breaks, tears or lesions  Psychiatry: Patient is appropriate, no evidence of psychoses    Data Reviewed: CBC:  Recent Labs Lab 08/20/15 1418 08/21/15 1735 08/22/15 0312 08/23/15 1507  WBC 14.7* 11.9* 10.9* 7.0  NEUTROABS  --  9.8* 9.5*  --     HGB 9.2* 9.1* 9.1* 9.6*  HCT 28.9* 29.3* 29.5* 31.0*  MCV 83.8 84.2 85.5 84.0  PLT 283 242 265 830   Basic Metabolic Panel:  Recent Labs Lab 08/20/15 1418 08/21/15 1735 08/22/15 0312 08/23/15 1507 08/26/15 0529  NA 138 141 137 137 137  K 4.3 3.9 4.0 3.9 4.2  CL 101 110 105 103 102  CO2 '28 25 25 27 28  ' GLUCOSE 90 109* 172* 149* 118*  BUN 37* 39* 30* 10 10  CREATININE 1.08 1.51* 1.05 0.88 0.67  CALCIUM 9.2 7.8* 8.4* 8.9 9.3   GFR: Estimated Creatinine Clearance: 99.6 mL/min (by C-G formula based on Cr of 0.67). Liver Function Tests:  Recent Labs Lab 08/21/15 1735 08/22/15 0312  AST 24 25  ALT 21 25  ALKPHOS 48 55  BILITOT 0.6 0.7  PROT 5.3* 6.1*  ALBUMIN 2.0* 2.2*   No results for input(s): LIPASE, AMYLASE in the last 168 hours.  Recent Labs Lab 08/22/15 0413  AMMONIA 32   Coagulation Profile: No results for input(s): INR, PROTIME in the last 168 hours. Cardiac Enzymes: No results for input(s): CKTOTAL, CKMB, CKMBINDEX, TROPONINI in the last 168 hours. BNP (last 3 results) No results for input(s): PROBNP in the last 8760 hours. HbA1C: No results for input(s): HGBA1C in the last 72 hours. CBG:  Recent Labs Lab 08/24/15 2153 08/25/15 0746 08/25/15 1148 08/25/15 1651 08/26/15 0753  GLUCAP 138* 108* 156* 110* 110*   Lipid Profile: No results for input(s): CHOL, HDL, LDLCALC, TRIG, CHOLHDL, LDLDIRECT in the last 72 hours. Thyroid Function Tests: No results for input(s): TSH, T4TOTAL, FREET4, T3FREE, THYROIDAB in the last 72 hours. Anemia Panel: No results for input(s): VITAMINB12, FOLATE, FERRITIN, TIBC, IRON, RETICCTPCT in the last 72 hours. Urine analysis:    Component Value Date/Time   COLORURINE AMBER* 08/21/2015 1854   APPEARANCEUR CLEAR 08/21/2015 1854   LABSPEC 1.022 08/21/2015 1854   PHURINE 5.5 08/21/2015 1854   GLUCOSEU NEGATIVE 08/21/2015 1854   HGBUR NEGATIVE 08/21/2015 [place] NEGATIVE 08/21/2015 [place]  NEGATIVE 08/21/2015 1854   PROTEINUR NEGATIVE 08/21/2015 1854   UROBILINOGEN 1.0 09/16/2013 2243   NITRITE NEGATIVE 08/21/2015 1854   LEUKOCYTESUR NEGATIVE 08/21/2015 1854   Sepsis Labs: '@LABRCNTIP' (procalcitonin:4,lacticidven:4)  ) Recent Results (from the past 240 hour(s))  Wound or Superficial Culture     Status: None   Collection Time: 08/17/15  9:23 AM  Result Value Ref Range Status   Specimen Description WOUND FOOT RIGHT  Final   Special Requests Normal  Final   Gram Stain   Final    FEW WBC PRESENT, PREDOMINANTLY PMN RARE GRAM POSITIVE COCCOBACILLUS Performed at [facility]    Culture   Final    MODERATE METHICILLIN RESISTANT STAPHYLOCOCCUS AUREUS   Report Status 08/20/2015 FINAL  Final   Organism ID, Bacteria METHICILLIN RESISTANT STAPHYLOCOCCUS AUREUS  Final      Susceptibility   Methicillin resistant staphylococcus aureus - MIC*    CIPROFLOXACIN >=8 RESISTANT Resistant     ERYTHROMYCIN <=0.25 SENSITIVE Sensitive  GENTAMICIN <=0.5 SENSITIVE Sensitive     OXACILLIN >=4 RESISTANT Resistant     TETRACYCLINE 2 SENSITIVE Sensitive     VANCOMYCIN 1 SENSITIVE Sensitive     TRIMETH/SULFA <=10 SENSITIVE Sensitive     CLINDAMYCIN <=0.25 SENSITIVE Sensitive     RIFAMPIN <=0.5 SENSITIVE Sensitive     Inducible Clindamycin NEGATIVE Sensitive     * MODERATE METHICILLIN RESISTANT STAPHYLOCOCCUS AUREUS  Urine culture     Status: None   Collection Time: 08/21/15  6:54 PM  Result Value Ref Range Status   Specimen Description URINE, CLEAN CATCH  Final   Special Requests NONE  Final   Culture NO GROWTH  Final   Report Status 08/23/2015 FINAL  Final  Blood culture (routine x 2)     Status: None (Preliminary result)   Collection Time: 08/21/15  7:09 PM  Result Value Ref Range Status   Specimen Description BLOOD RIGHT HAND  Final   Special Requests IN PEDIATRIC BOTTLE 5CC  Final   Culture NO GROWTH 4 DAYS  Final   Report Status PENDING  Incomplete  Blood culture  (routine x 2)     Status: None (Preliminary result)   Collection Time: 08/21/15  7:53 PM  Result Value Ref Range Status   Specimen Description BLOOD RIGHT HAND  Final   Special Requests IN PEDIATRIC BOTTLE 1CC  Final   Culture NO GROWTH 4 DAYS  Final   Report Status PENDING  Incomplete  MRSA PCR Screening     Status: None   Collection Time: 08/22/15 12:10 AM  Result Value Ref Range Status   MRSA by PCR NEGATIVE NEGATIVE Final    Comment:        The GeneXpert MRSA Assay (FDA approved for NASAL specimens only), is one component of a comprehensive MRSA colonization surveillance program. It is not intended to diagnose MRSA infection nor to guide or monitor treatment for MRSA infections.   Surgical PCR screen     Status: None   Collection Time: 08/23/15  6:06 AM  Result Value Ref Range Status   MRSA, PCR NEGATIVE NEGATIVE Final   Staphylococcus aureus NEGATIVE NEGATIVE Final    Comment:        The Xpert SA Assay (FDA approved for NASAL specimens in patients over 56 years of age), is one component of a comprehensive surveillance program.  Test performance has been validated by [facility] for patients greater than or equal to 66 year old. It is not intended to diagnose infection nor to guide or monitor treatment.       Studies: No results found.  Scheduled Meds: . [START ON 08/27/2015]  ceFAZolin (ANCEF) IV  2 g Intravenous To SS-Surg  . cloZAPine  100 mg Oral 2 times per day  . cloZAPine  400 mg Oral QHS  . donepezil  5 mg Oral QHS  . gabapentin  300 mg Oral TID  . haloperidol  5 mg Oral QHS  . insulin aspart  0-9 Units Subcutaneous TID WC  . lactulose  30 g Oral BID  . metoprolol tartrate  25 mg Oral BID  . pantoprazole  40 mg Oral Daily  . piperacillin-tazobactam (ZOSYN)  IV  3.375 g Intravenous Q8H  . simvastatin  5 mg Oral QHS  . trihexyphenidyl  5 mg Oral BID WC  . vancomycin  1,500 mg Intravenous Q12H    Continuous Infusions:    LOS: 5 days   Time  spent: 15 minutes  Annita Brod, MD [facility] Pager [phone]  If 7PM-7AM, please contact night-coverage [email or web address] Password [name] 08/26/2015, 10:25 AM

## 2015-08-26 NOTE — Progress Notes (Addendum)
CSW spoke with pt concerning possible need for rehab prior to return to ALF- pt is agreeable to SNF stay to try and regain mobility before returning to ALF  [facility] number applied for- went to manual review- will follow up when [facility] reopens tomorrow  Per MD note plan for amputation tomorrow- CSW will send out referral after PT eval following surgery  CSW will continue to follow  Domenica Reamer, [place] Worker [phone]

## 2015-08-27 ENCOUNTER — Encounter (HOSPITAL_COMMUNITY)
Admission: EM | Disposition: A | Discharge status: Rehabilitation Facility | Payer: Self-pay | Source: Home / Self Care | Attending: Internal Medicine

## 2015-08-27 ENCOUNTER — Inpatient Hospital Stay (HOSPITAL_COMMUNITY): Payer: Medicaid Other | Admitting: Certified Registered"

## 2015-08-27 ENCOUNTER — Encounter (HOSPITAL_COMMUNITY): Payer: Self-pay | Admitting: Surgery

## 2015-08-27 DIAGNOSIS — E0840 Diabetes mellitus due to underlying condition with diabetic neuropathy, unspecified: Secondary | ICD-10-CM

## 2015-08-27 DIAGNOSIS — F209 Schizophrenia, unspecified: Secondary | ICD-10-CM

## 2015-08-27 DIAGNOSIS — G9340 Encephalopathy, unspecified: Secondary | ICD-10-CM

## 2015-08-27 DIAGNOSIS — J439 Emphysema, unspecified: Secondary | ICD-10-CM

## 2015-08-27 DIAGNOSIS — E1169 Type 2 diabetes mellitus with other specified complication: Secondary | ICD-10-CM

## 2015-08-27 DIAGNOSIS — I10 Essential (primary) hypertension: Secondary | ICD-10-CM

## 2015-08-27 DIAGNOSIS — M869 Osteomyelitis, unspecified: Secondary | ICD-10-CM

## 2015-08-27 DIAGNOSIS — I739 Peripheral vascular disease, unspecified: Secondary | ICD-10-CM

## 2015-08-27 DIAGNOSIS — N179 Acute kidney failure, unspecified: Secondary | ICD-10-CM

## 2015-08-27 HISTORY — PX: AMPUTATION: SHX166

## 2015-08-27 LAB — GLUCOSE, CAPILLARY
GLUCOSE-CAPILLARY: 94 mg/dL (ref 65–99)
GLUCOSE-CAPILLARY: 124 mg/dL — AB (ref 65–99)
GLUCOSE-CAPILLARY: 137 mg/dL — AB (ref 65–99)
Glucose-Capillary: 157 mg/dL — ABNORMAL HIGH (ref 65–99)

## 2015-08-27 LAB — BASIC METABOLIC PANEL
Anion gap: 7 (ref 5–15)
BUN: 10 mg/dL (ref 6–20)
CALCIUM: 9 mg/dL (ref 8.9–10.3)
CHLORIDE: 105 mmol/L (ref 101–111)
CO2: 28 mmol/L (ref 22–32)
CREATININE: 0.71 mg/dL (ref 0.61–1.24)
GFR calc non Af Amer: >60 mL/min (ref >60)
Glucose, Bld: 115 mg/dL — ABNORMAL HIGH (ref 65–99)
Potassium: 3.8 mmol/L (ref 3.5–5.1)
SODIUM: 140 mmol/L (ref 135–145)

## 2015-08-27 SURGERY — AMPUTATION BELOW KNEE
Anesthesia: Monitor Anesthesia Care | Site: Leg Lower | Laterality: Right

## 2015-08-27 MED ORDER — FENTANYL CITRATE (PF) 250 MCG/5ML IJ SOLN
INTRAMUSCULAR | Status: AC
Start: 2015-08-27 — End: 2015-08-27
  Filled 2015-08-27: qty 5

## 2015-08-27 MED ORDER — OXYCODONE HCL 5 MG PO TABS
5.0000 mg | ORAL_TABLET | ORAL | Status: DC | PRN
  Administered 2015-08-27: 5 mg via ORAL
  Filled 2015-08-27: qty 1

## 2015-08-27 MED ORDER — BUPIVACAINE-EPINEPHRINE (PF) 0.5% -1:200000 IJ SOLN
INTRAMUSCULAR | Status: DC | PRN
  Administered 2015-08-27: 20 mL via PERINEURAL
  Administered 2015-08-27: 30 mL via PERINEURAL

## 2015-08-27 MED ORDER — ACETAMINOPHEN 325 MG PO TABS: 325.0000 mg | ORAL_TABLET | ORAL | Status: DC | PRN

## 2015-08-27 MED ORDER — ONDANSETRON HCL 4 MG/2ML IJ SOLN: 4.0000 mg | Freq: Four times a day (QID) | INTRAMUSCULAR | Status: DC | PRN

## 2015-08-27 MED ORDER — FENTANYL CITRATE (PF) 100 MCG/2ML IJ SOLN: 25.0000 ug | INTRAMUSCULAR | Status: DC | PRN

## 2015-08-27 MED ORDER — HYDROMORPHONE HCL 1 MG/ML IJ SOLN: 1.0000 mg | INTRAMUSCULAR | Status: DC | PRN

## 2015-08-27 MED ORDER — MIDAZOLAM HCL 5 MG/5ML IJ SOLN
INTRAMUSCULAR | Status: DC | PRN
  Administered 2015-08-27: 2 mg via INTRAVENOUS

## 2015-08-27 MED ORDER — FENTANYL CITRATE (PF) 100 MCG/2ML IJ SOLN
INTRAMUSCULAR | Status: AC
  Administered 2015-08-27: 100 ug
  Filled 2015-08-27: qty 2

## 2015-08-27 MED ORDER — METOCLOPRAMIDE HCL 10 MG PO TABS: 5.0000 mg | ORAL_TABLET | Freq: Three times a day (TID) | ORAL | Status: DC | PRN

## 2015-08-27 MED ORDER — LACTATED RINGERS IV SOLN
INTRAVENOUS | Status: DC
  Administered 2015-08-27: 11:00:00 via INTRAVENOUS

## 2015-08-27 MED ORDER — CHLORHEXIDINE GLUCONATE 4 % EX LIQD: 60.0000 mL | Freq: Once | CUTANEOUS | Status: DC

## 2015-08-27 MED ORDER — ACETAMINOPHEN 650 MG RE SUPP: 650.0000 mg | Freq: Four times a day (QID) | RECTAL | Status: DC | PRN

## 2015-08-27 MED ORDER — MUPIROCIN 2 % EX OINT
1.0000 "application " | TOPICAL_OINTMENT | Freq: Two times a day (BID) | CUTANEOUS | Status: DC
  Administered 2015-08-27 – 2015-08-28 (×3): 1 via NASAL
  Filled 2015-08-27: qty 22

## 2015-08-27 MED ORDER — ACETAMINOPHEN 325 MG PO TABS: 650.0000 mg | ORAL_TABLET | Freq: Four times a day (QID) | ORAL | Status: DC | PRN

## 2015-08-27 MED ORDER — ACETAMINOPHEN 160 MG/5ML PO SOLN: 325.0000 mg | ORAL | Status: DC | PRN

## 2015-08-27 MED ORDER — FENTANYL CITRATE (PF) 100 MCG/2ML IJ SOLN
INTRAMUSCULAR | Status: DC | PRN
  Administered 2015-08-27: 50 ug via INTRAVENOUS

## 2015-08-27 MED ORDER — PROPOFOL 10 MG/ML IV BOLUS
INTRAVENOUS | Status: AC
Start: 2015-08-27 — End: 2015-08-27
  Filled 2015-08-27: qty 20

## 2015-08-27 MED ORDER — LIDOCAINE-EPINEPHRINE (PF) 1.5 %-1:200000 IJ SOLN
INTRAMUSCULAR | Status: DC | PRN
  Administered 2015-08-27 (×2): 5 mL via PERINEURAL

## 2015-08-27 MED ORDER — METHOCARBAMOL 500 MG PO TABS: 500.0000 mg | ORAL_TABLET | Freq: Four times a day (QID) | ORAL | Status: DC | PRN

## 2015-08-27 MED ORDER — ONDANSETRON HCL 4 MG PO TABS: 4.0000 mg | ORAL_TABLET | Freq: Four times a day (QID) | ORAL | Status: DC | PRN

## 2015-08-27 MED ORDER — METHOCARBAMOL 1000 MG/10ML IJ SOLN
500.0000 mg | Freq: Four times a day (QID) | INTRAVENOUS | Status: DC | PRN
  Filled 2015-08-27: qty 5

## 2015-08-27 MED ORDER — PROPOFOL 500 MG/50ML IV EMUL
INTRAVENOUS | Status: DC | PRN
  Administered 2015-08-27: 50 ug/kg/min via INTRAVENOUS

## 2015-08-27 MED ORDER — OXYCODONE HCL 5 MG PO TABS: 5.0000 mg | ORAL_TABLET | Freq: Once | ORAL | Status: DC | PRN

## 2015-08-27 MED ORDER — CHLORHEXIDINE GLUCONATE CLOTH 2 % EX PADS
6.0000 | MEDICATED_PAD | Freq: Every day | CUTANEOUS | Status: DC
  Administered 2015-08-27: 6 via TOPICAL

## 2015-08-27 MED ORDER — LIDOCAINE 2% (20 MG/ML) 5 ML SYRINGE
INTRAMUSCULAR | Status: AC
Start: 2015-08-27 — End: 2015-08-27
  Filled 2015-08-27: qty 5

## 2015-08-27 MED ORDER — SODIUM CHLORIDE 0.9 % IV SOLN: INTRAVENOUS | Status: DC

## 2015-08-27 MED ORDER — METOCLOPRAMIDE HCL 5 MG/ML IJ SOLN: 5.0000 mg | Freq: Three times a day (TID) | INTRAMUSCULAR | Status: DC | PRN

## 2015-08-27 MED ORDER — OXYCODONE HCL 5 MG/5ML PO SOLN: 5.0000 mg | Freq: Once | ORAL | Status: DC | PRN

## 2015-08-27 MED ORDER — 0.9 % SODIUM CHLORIDE (POUR BTL) OPTIME
TOPICAL | Status: DC | PRN
  Administered 2015-08-27: 1000 mL

## 2015-08-27 MED ORDER — MIDAZOLAM HCL 2 MG/2ML IJ SOLN
INTRAMUSCULAR | Status: AC
  Filled 2015-08-27: qty 2

## 2015-08-27 SURGICAL SUPPLY — 37 items
BLADE SAW RECIP 87.9 MT (BLADE) ×3 IMPLANT
BLADE SURG 21 STRL SS (BLADE) ×3 IMPLANT
BNDG COHESIVE 6X5 TAN STRL LF (GAUZE/BANDAGES/DRESSINGS) IMPLANT
BNDG GAUZE ELAST 4 BULKY (GAUZE/BANDAGES/DRESSINGS) IMPLANT
COVER SURGICAL LIGHT HANDLE (MISCELLANEOUS) ×6 IMPLANT
CUFF TOURNIQUET SINGLE 34IN LL (TOURNIQUET CUFF) ×2 IMPLANT
CUFF TOURNIQUET SINGLE 44IN (TOURNIQUET CUFF) IMPLANT
DRAPE EXTREMITY T 121X128X90 (DRAPE) ×3 IMPLANT
DRAPE INCISE IOBAN 66X45 STRL (DRAPES) IMPLANT
DRAPE PROXIMA HALF (DRAPES) ×3 IMPLANT
DRAPE U-SHAPE 47X51 STRL (DRAPES) ×3 IMPLANT
DRSG ADAPTIC 3X8 NADH LF (GAUZE/BANDAGES/DRESSINGS) ×1 IMPLANT
DRSG PAD ABDOMINAL 8X10 ST (GAUZE/BANDAGES/DRESSINGS) ×3 IMPLANT
DURAPREP 26ML APPLICATOR (WOUND CARE) ×3 IMPLANT
ELECT REM PT RETURN 9FT ADLT (ELECTROSURGICAL) ×3
ELECTRODE REM PT RTRN 9FT ADLT (ELECTROSURGICAL) ×1 IMPLANT
GAUZE SPONGE 4X4 12PLY STRL (GAUZE/BANDAGES/DRESSINGS) ×1 IMPLANT
GLOVE BIOGEL PI IND STRL 9 (GLOVE) ×1 IMPLANT
GLOVE BIOGEL PI INDICATOR 9 (GLOVE) ×2
GLOVE SURG ORTHO 9.0 STRL STRW (GLOVE) ×3 IMPLANT
GOWN STRL REUS W/ TWL XL LVL3 (GOWN DISPOSABLE) ×2 IMPLANT
GOWN STRL REUS W/TWL XL LVL3 (GOWN DISPOSABLE) ×6
KIT BASIN OR (CUSTOM PROCEDURE TRAY) ×3 IMPLANT
KIT PREVENA INCISION MGT 13 (CANNISTER) ×2 IMPLANT
KIT ROOM TURNOVER OR (KITS) ×3 IMPLANT
MANIFOLD NEPTUNE II (INSTRUMENTS) ×3 IMPLANT
NS IRRIG 1000ML POUR BTL (IV SOLUTION) ×3 IMPLANT
PACK GENERAL/GYN (CUSTOM PROCEDURE TRAY) ×3 IMPLANT
PAD ARMBOARD 7.5X6 YLW CONV (MISCELLANEOUS) ×3 IMPLANT
SPONGE GAUZE 4X4 12PLY STER LF (GAUZE/BANDAGES/DRESSINGS) ×2 IMPLANT
SPONGE LAP 18X18 X RAY DECT (DISPOSABLE) IMPLANT
STAPLER VISISTAT 35W (STAPLE) IMPLANT
STOCKINETTE IMPERVIOUS LG (DRAPES) ×3 IMPLANT
SUT SILK 2 0 (SUTURE) ×3
SUT SILK 2-0 18XBRD TIE 12 (SUTURE) ×1 IMPLANT
SUT VIC AB 1 CTX 27 (SUTURE) IMPLANT
TOWEL OR 17X26 10 PK STRL BLUE (TOWEL DISPOSABLE) ×3 IMPLANT

## 2015-08-27 NOTE — Transfer of Care (Signed)
Immediate Anesthesia Transfer of Care Note  Patient: Craig Bentley  Procedure(s) Performed: Procedure(s): RIGHT BELOW KNEE AMPUTATION  (Right)  Patient Location: PACU  Anesthesia Type:MAC  Level of Consciousness: awake, alert , oriented and patient cooperative  Airway & Oxygen Therapy: Patient Spontanous Breathing  Post-op Assessment: Report given to RN and Post -op Vital signs reviewed and stable  Post vital signs: Reviewed and stable  Last Vitals:  Filed Vitals:   08/27/15 1115 08/27/15 1120  BP: 155/51 156/59  Pulse: 76 78  Temp:    Resp: 18 18    Last Pain:  Filed Vitals:   08/27/15 1125  PainSc: Asleep      Patients Stated Pain Goal: 5 (40/97/35 3299)  Complications: No apparent anesthesia complications

## 2015-08-27 NOTE — Anesthesia Preprocedure Evaluation (Signed)
Anesthesia Evaluation  Patient identified by MRN, date of birth, ID band Patient awake    Reviewed: Allergy & Precautions, NPO status , Patient's Chart, lab work & pertinent test results  History of Anesthesia Complications Negative for: history of anesthetic complications  Airway Mallampati: I  TM Distance: >3 FB Neck ROM: Full    Dental  (+) Edentulous Upper, Edentulous Lower   Pulmonary shortness of breath, COPD,  COPD inhaler, Current Smoker,    Pulmonary exam normal breath sounds clear to auscultation       Cardiovascular hypertension, Pt. on medications + Peripheral Vascular Disease  Normal cardiovascular exam Rhythm:Regular     Neuro/Psych PSYCHIATRIC DISORDERS Anxiety Schizophrenia    GI/Hepatic negative GI ROS, Neg liver ROS,   Endo/Other  diabetes, Type 2, Oral Hypoglycemic Agents  Renal/GU Renal disease     Musculoskeletal   Abdominal   Peds  Hematology  (+) anemia ,   Anesthesia Other Findings   Reproductive/Obstetrics                             Anesthesia Physical Anesthesia Plan  ASA: III  Anesthesia Plan: MAC and Regional   Post-op Pain Management:    Induction: Intravenous  Airway Management Planned: Natural Airway, Nasal Cannula and Simple Face Mask  Additional Equipment: None  Intra-op Plan:   Post-operative Plan:   Informed Consent: I have reviewed the patients History and Physical, chart, labs and discussed the procedure including the risks, benefits and alternatives for the proposed anesthesia with the patient or authorized representative who has indicated his/her understanding and acceptance.   Dental advisory given  Plan Discussed with: CRNA and Surgeon  Anesthesia Plan Comments:         Anesthesia Quick Evaluation

## 2015-08-27 NOTE — Anesthesia Procedure Notes (Addendum)
Procedure Name: MAC Date/Time: 08/27/2015 11:35 AM Performed by: Lance Coon Pre-anesthesia Checklist: Patient identified, Emergency Drugs available, Suction available, Timeout performed and Patient being monitored Patient Re-evaluated:Patient Re-evaluated prior to inductionOxygen Delivery Method: Nasal cannula Intubation Type: IV induction Ventilation: Nasal airway inserted- appropriate to patient size   Anesthesia Regional Block:  Popliteal block  Pre-Anesthetic Checklist: ,, timeout performed, Correct Patient, Correct Site, Correct Laterality, Correct Procedure, Correct Position, site marked, Risks and benefits discussed,  Surgical consent,  Pre-op evaluation,  At surgeon's request and post-op pain management  Laterality: Lower and Right  Prep: chloraprep       Needles:  Injection technique: Single-shot  Needle Type: Echogenic Stimulator Needle          Additional Needles:  Procedures: ultrasound guided (picture in chart) Popliteal block Narrative:  Injection made incrementally with aspirations every 5 mL.  Performed by: Personally  Anesthesiologist: Uel Davidow  Additional Notes: H+P and labs reviewed, risks and benefits discussed with patient, procedure tolerated well without complications   Anesthesia Regional Block:  Femoral nerve block  Pre-Anesthetic Checklist: ,, timeout performed, Correct Patient, Correct Site, Correct Laterality, Correct Procedure, Correct Position, site marked, Risks and benefits discussed,  Surgical consent,  Pre-op evaluation,  At surgeon's request and post-op pain management  Laterality: Lower and Right  Prep: chloraprep       Needles:  Injection technique: Single-shot  Needle Type: Echogenic Stimulator Needle          Additional Needles:  Procedures: ultrasound guided (picture in chart) and nerve stimulator Femoral nerve block  Nerve Stimulator or Paresthesia:  Response: quad, 0.5 mA,   Additional Responses:    Narrative:  Injection made incrementally with aspirations every 5 mL.  Performed by: Personally  Anesthesiologist: Arick Mareno  Additional Notes: H+P and labs reviewed, risks and benefits discussed with patient, procedure tolerated well without complications

## 2015-08-27 NOTE — Progress Notes (Signed)
[facility]                                                                              Patient Demographics  Craig Bentley, is a 60 y.o. male, DOB - Aug 15, 1955, ERX:[id number]  Admit date - 08/21/2015   Admitting Physician Rise Patience, MD  Outpatient Primary MD for the patient is Mabeline Caras, NP  Outpatient specialists:   LOS - 6  days    Chief Complaint  Patient presents with  . Fatigue       Brief summary   60 y.o. male with past medical history significant for peripheral vascular disease status post aorto femoral bypass and right foot fifth toe amputation in March 2017, hypertension, diabetes mellitus, hyperlipidemia and COPD. Patient was brought from his living facility to ER because of more confusion felt to be because of fentanyl patch which was subsequently discontinued. In ER, patient was found to be hypotensive, febrile with Tmax 102F, tachycardic in addition to having leukocytosis and x-ray findings of right foot possible osteomyelitis. Patient was seen by vascular surgery as well as orthopedic surgery. Plan is for transtibial amputation 08/26/2012.   Assessment & Plan   Principal Problem: Sepsis secondary to right foot MRSA diabetic osteomyelitis:  - Patient met criteria for sepsis on admission given fever, tachycardia and hypotension as well as source being infection seen on plain films. Sepsis since stabilized with IV fluids and antibiotics.  - Vascular surgery, orthopedic surgery consulted. - Wound culture positive for MRSA, blood cultures negative so far. Continue vancomycin and Zosyn. -  transtibial amputation planned today  Active Problems:  Essential hypertension:  - Stable. On metoprolol 25 twice a day.   COPD (chronic obstructive pulmonary disease) ([place]): -  Stable, no wheezing. Breathing comfortably on room air.   Diabetes mellitus with neurologic complication, without long-term current use of insulin ([place]): - CBG stable,  under 180. Continue Neurontin.   PVD (peripheral vascular disease) ([place]) status post aortofemoral bypass:- R CFA to PTA with composite graft, L CFA To SFA BPG w/ ips reversed GSV, R 5th toe amputation (Date: 04/25/15) - Vasc Surgery following    Schizophrenia ([place]): Stable. Patient on Aricept, Haldol and clozapine.   ARF (acute renal failure) (HCC)/acute kidney injury:  - Stable. Creatinine on admission at 1.51, now normalized with IV fluids. Secondary to initial sepsis.   Acute encephalopathy:  - Likely in the setting of osteomyelitis. Has since resolved  Code Status: full  DVT Prophylaxis:  SCD's Family Communication: Discussed in detail with the patient, all imaging results, lab results explained to the patient   Disposition Plan:   Time Spent in minutes   25 minutes  Procedures:  Planned transtibial amputation 7/5  Consultants:   Orthopedics Vascular surgery  Antimicrobials:   IV vancomycin 6/29  IV Zosyn 6/29   Medications  Scheduled Meds: . chlorhexidine  60 mL Topical Once  . [MAR Hold] Chlorhexidine Gluconate Cloth  6 each Topical Q0600  . [MAR Hold] cloZAPine  100 mg Oral 2 times per day  . [MAR Hold] cloZAPine  400 mg Oral QHS  . [MAR Hold] donepezil  5 mg  Oral QHS  . [MAR Hold] gabapentin  300 mg Oral TID  . [MAR Hold] haloperidol  5 mg Oral QHS  . [MAR Hold] insulin aspart  0-9 Units Subcutaneous TID WC  . [MAR Hold] lactulose  30 g Oral BID  . [MAR Hold] metoprolol tartrate  25 mg Oral BID  . [MAR Hold] mupirocin ointment  1 application Nasal BID  . [MAR Hold] pantoprazole  40 mg Oral Daily  . [MAR Hold] piperacillin-tazobactam (ZOSYN)  IV  3.375 g Intravenous Q8H  . [MAR Hold] simvastatin  5 mg Oral QHS  . [MAR Hold] trihexyphenidyl  5 mg Oral BID WC  . [MAR Hold] vancomycin  1,500 mg Intravenous Q12H   Continuous Infusions: . lactated ringers Stopped (08/27/15 1217)   PRN Meds:.acetaminophen **OR** acetaminophen (TYLENOL) oral liquid 160  mg/5 mL, [MAR Hold] acetaminophen **OR** [MAR Hold] acetaminophen, [MAR Hold] dicyclomine, fentaNYL (SUBLIMAZE) injection, [MAR Hold] guaiFENesin, [MAR Hold] hydrALAZINE, [MAR Hold] ipratropium-albuterol, [MAR Hold] ondansetron **OR** [MAR Hold] ondansetron (ZOFRAN) IV, oxyCODONE **OR** oxyCODONE, [MAR Hold] oxyCODONE-acetaminophen, [MAR Hold] traMADol   Antibiotics   Anti-infectives    Start     Dose/Rate Route Frequency Ordered Stop   08/27/15 1100  ceFAZolin (ANCEF) IVPB 2g/100 mL premix     2 g 200 mL/hr over 30 Minutes Intravenous To [facility] 08/25/15 1001 08/27/15 1140   08/25/15 0700  [MAR Hold]  vancomycin (VANCOCIN) 1,500 mg in sodium chloride 0.9 % 500 mL IVPB     (MAR Hold since 08/27/15 1034)   1,500 mg 250 mL/hr over 120 Minutes Intravenous Every 12 hours 08/24/15 1928     08/22/15 0700  vancomycin (VANCOCIN) IVPB 750 mg/150 ml premix  Status:  Discontinued     750 mg 150 mL/hr over 60 Minutes Intravenous Every 12 hours 08/21/15 2157 08/24/15 1928   08/22/15 0100  [MAR Hold]  piperacillin-tazobactam (ZOSYN) IVPB 3.375 g     (MAR Hold since 08/27/15 1034)   3.375 g 12.5 mL/hr over 240 Minutes Intravenous Every 8 hours 08/21/15 2157     08/21/15 1845  vancomycin (VANCOCIN) IVPB 1000 mg/200 mL premix     1,000 mg 200 mL/hr over 60 Minutes Intravenous  Once 08/21/15 1840 08/21/15 2133   08/21/15 1845  piperacillin-tazobactam (ZOSYN) IVPB 3.375 g     3.375 g 100 mL/hr over 30 Minutes Intravenous  Once 08/21/15 1840 08/21/15 2050        Subjective:   Craig Bentley was seen and examined today. Patient denies dizziness, chest pain, shortness of breath, abdominal pain, N/V/D/C, new weakness, numbess, tingling. No acute events overnight.    Objective:   Filed Vitals:   08/27/15 1115 08/27/15 1120 08/27/15 1222 08/27/15 1237  BP: 155/51 156/59 149/81 142/64  Pulse: 76 78 73 71  Temp:   98.3 F (36.8 C)   TempSrc:      Resp: '18 18 12 14  ' Height:      Weight:        SpO2: 98% 98% 100% 98%    Intake/Output Summary (Last 24 hours) at 08/27/15 1239 Last data filed at 08/27/15 1217  Gross per 24 hour  Intake    990 ml  Output   2000 ml  Net  -1010 ml     Wt Readings from Last 3 Encounters:  08/25/15 71.7 kg (158 lb 1.1 oz)  05/16/15 78.926 kg (174 lb)  04/24/15 83.2 kg (183 lb 6.8 oz)     Exam  General: Alert and oriented x  3, NAD  HEENT:  PERRLA, EOMI, Anicteric Sclera, mucous membranes moist.   Neck: Supple, no JVD, no masses  Cardiovascular: S1 S2 auscultated, no rubs, murmurs or gallops. Regular rate and rhythm.  Respiratory: Clear to auscultation bilaterally, no wheezing, rales or rhonchi  Gastrointestinal: Soft, nontender, nondistended, + bowel sounds  Ext: no cyanosis clubbing or edema. Right forefoot with gangrene status post right fifth toe amputation  Neuro: AAOx3, Cr N's II- XII. Strength 5/5 upper and lower extremities bilaterally  Skin: Skin breakdown with gangrene on the right forefoot  Psych: Normal affect and demeanor, alert and oriented x3    Data Reviewed:  I have personally reviewed following labs and imaging studies  Micro Results Recent Results (from the past 240 hour(s))  Urine culture     Status: None   Collection Time: 08/21/15  6:54 PM  Result Value Ref Range Status   Specimen Description URINE, CLEAN CATCH  Final   Special Requests NONE  Final   Culture NO GROWTH  Final   Report Status 08/23/2015 FINAL  Final  Blood culture (routine x 2)     Status: None   Collection Time: 08/21/15  7:09 PM  Result Value Ref Range Status   Specimen Description BLOOD RIGHT HAND  Final   Special Requests IN PEDIATRIC BOTTLE 5CC  Final   Culture NO GROWTH 5 DAYS  Final   Report Status 08/26/2015 FINAL  Final  Blood culture (routine x 2)     Status: None   Collection Time: 08/21/15  7:53 PM  Result Value Ref Range Status   Specimen Description BLOOD RIGHT HAND  Final   Special Requests IN PEDIATRIC BOTTLE [place]   Final   Culture NO GROWTH 5 DAYS  Final   Report Status 08/26/2015 FINAL  Final  MRSA PCR Screening     Status: None   Collection Time: 08/22/15 12:10 AM  Result Value Ref Range Status   MRSA by PCR NEGATIVE NEGATIVE Final    Comment:        The GeneXpert MRSA Assay (FDA approved for NASAL specimens only), is one component of a comprehensive MRSA colonization surveillance program. It is not intended to diagnose MRSA infection nor to guide or monitor treatment for MRSA infections.   Surgical PCR screen     Status: None   Collection Time: 08/23/15  6:06 AM  Result Value Ref Range Status   MRSA, PCR NEGATIVE NEGATIVE Final   Staphylococcus aureus NEGATIVE NEGATIVE Final    Comment:        The Xpert SA Assay (FDA approved for NASAL specimens in patients over 52 years of age), is one component of a comprehensive surveillance program.  Test performance has been validated by [facility] for patients greater than or equal to 61 year old. It is not intended to diagnose infection nor to guide or monitor treatment.   Surgical pcr screen     Status: Abnormal   Collection Time: 08/26/15 12:11 PM  Result Value Ref Range Status   MRSA, PCR POSITIVE (A) NEGATIVE Final    Comment: RESULT CALLED TO, READ BACK BY AND VERIFIED WITH: C HANCOCK 08/26/15 @ 1440 M VESTAL    Staphylococcus aureus POSITIVE (A) NEGATIVE Final    Comment:        The Xpert SA Assay (FDA approved for NASAL specimens in patients over 64 years of age), is one component of a comprehensive surveillance program.  Test performance has been validated by [facility] for patients  greater than or equal to 35 year old. It is not intended to diagnose infection nor to guide or monitor treatment.     Radiology Reports Ct Head Wo Contrast  08/21/2015  CLINICAL DATA:  Acute mental status change. EXAM: CT HEAD WITHOUT CONTRAST TECHNIQUE: Contiguous axial images were obtained from the base of the skull through the vertex  without intravenous contrast. COMPARISON:  CT of the brain June 10, 2009 FINDINGS: Paranasal sinuses, mastoid air cells, and bones are normal. Mild soft tissue swelling over the posterior calvarium near the vertex. Recommend clinical correlation. Extracranial soft tissues are otherwise within normal limits. No subdural, epidural, or subarachnoid hemorrhage. The cerebellum, brainstem, and basal cisterns are within normal limits. Ventricles and sulci are unremarkable. No acute cortical ischemia or infarct. No mass, mass effect, or midline shift. IMPRESSION: No acute intracranial process. Electronically Signed   By: Dorise Bullion III M.D   On: 08/21/2015 23:07   Dg Chest Port 1 View  08/21/2015  CLINICAL DATA:  Pt arrives from [facility] via [place]. Pt was seen at [facility] yesterday for generalized weakness, transported out today for same. Pt was hypotensive on scene and remains that way upon arrival. Diaphoretic. EXAM: PORTABLE CHEST 1 VIEW COMPARISON:  04/24/2015 FINDINGS: Heart size is within normal limits. There is minimal density at the left lung base which may represent residual atelectasis or scarring. Overall the appearance is improved since the prior study. The right lung is clear. No pulmonary edema. IMPRESSION: 1. Minimal left lower lobe density, which may represent chronic atelectasis or infiltrate. 2. Is hard to entirely exclude a recurrent left lower lobe infectious process. Electronically Signed   By: Nolon Nations M.D.   On: 08/21/2015 16:15   Dg Foot Complete Right  08/21/2015  CLINICAL DATA:  RIGHT foot pain for an unknown period of time, unresponsive, diabetes mellitus, hypertension, peripheral vascular disease EXAM: RIGHT FOOT COMPLETE - 3+ VIEW COMPARISON:  04/02/2015 FINDINGS: Marked osseous demineralization. Joint spaces preserved. Dressing artifacts at the amputation bed from fifth toe amputation. Bone destruction of the fifth metatarsal head consistent with osteomyelitis. No fracture,  dislocation, or additional bone destruction. Scattered soft tissue swelling RIGHT foot along the lateral and dorsal margins. IMPRESSION: Prior amputation of the fifth toe with destruction of the fifth metatarsal head consistent with osteomyelitis. Osseous demineralization. Electronically Signed   By: Lavonia Dana M.D.   On: 08/21/2015 17:12    Lab Data:  CBC:  Recent Labs Lab 08/20/15 1418 08/21/15 1735 08/22/15 0312 08/23/15 1507  WBC 14.7* 11.9* 10.9* 7.0  NEUTROABS  --  9.8* 9.5*  --   HGB 9.2* 9.1* 9.1* 9.6*  HCT 28.9* 29.3* 29.5* 31.0*  MCV 83.8 84.2 85.5 84.0  PLT 283 242 265 878   Basic Metabolic Panel:  Recent Labs Lab 08/21/15 1735 08/22/15 0312 08/23/15 1507 08/26/15 0529 08/27/15 0605  NA 141 137 137 137 140  K 3.9 4.0 3.9 4.2 3.8  CL 110 105 103 102 105  CO2 '25 25 27 28 28  ' GLUCOSE 109* 172* 149* 118* 115*  BUN 39* 30* '10 10 10  ' CREATININE 1.51* 1.05 0.88 0.67 0.71  CALCIUM 7.8* 8.4* 8.9 9.3 9.0   GFR: Estimated Creatinine Clearance: 99.6 mL/min (by C-G formula based on Cr of 0.71). Liver Function Tests:  Recent Labs Lab 08/21/15 1735 08/22/15 0312  AST 24 25  ALT 21 25  ALKPHOS 48 55  BILITOT 0.6 0.7  PROT 5.3* 6.1*  ALBUMIN 2.0* 2.2*  No results for input(s): LIPASE, AMYLASE in the last 168 hours.  Recent Labs Lab 08/22/15 0413  AMMONIA 32   Coagulation Profile: No results for input(s): INR, PROTIME in the last 168 hours. Cardiac Enzymes: No results for input(s): CKTOTAL, CKMB, CKMBINDEX, TROPONINI in the last 168 hours. BNP (last 3 results) No results for input(s): PROBNP in the last 8760 hours. HbA1C: No results for input(s): HGBA1C in the last 72 hours. CBG:  Recent Labs Lab 08/26/15 1149 08/26/15 1748 08/26/15 2141 08/27/15 1024 08/27/15 1228  GLUCAP 130* 134* 144* 94 157*   Lipid Profile: No results for input(s): CHOL, HDL, LDLCALC, TRIG, CHOLHDL, LDLDIRECT in the last 72 hours. Thyroid Function Tests: No results for  input(s): TSH, T4TOTAL, FREET4, T3FREE, THYROIDAB in the last 72 hours. Anemia Panel: No results for input(s): VITAMINB12, FOLATE, FERRITIN, TIBC, IRON, RETICCTPCT in the last 72 hours. Urine analysis:    Component Value Date/Time   COLORURINE AMBER* 08/21/2015 [place] 08/21/2015 1854   LABSPEC 1.022 08/21/2015 1854   PHURINE 5.5 08/21/2015 1854   GLUCOSEU NEGATIVE 08/21/2015 1854   HGBUR NEGATIVE 08/21/2015 1854   BILIRUBINUR NEGATIVE 08/21/2015 [place] NEGATIVE 08/21/2015 1854   PROTEINUR NEGATIVE 08/21/2015 1854   UROBILINOGEN 1.0 09/16/2013 2243   NITRITE NEGATIVE 08/21/2015 1854   LEUKOCYTESUR NEGATIVE 08/21/2015 1854     RAI,RIPUDEEP M.D. [facility] 08/27/2015, 12:39 PM  Pager: [phone] Between 7am to 7pm - call Pager - [phone]  After 7pm go to [email or web address] - password [name]  Call night coverage person covering after 7pm

## 2015-08-27 NOTE — H&P (View-Only) (Signed)
ORTHOPAEDIC CONSULTATION  REQUESTING PHYSICIAN: Karmen Bongo, MD  Chief Complaint: Painful dry gangrene right foot  HPI: Craig Bentley is a 60 y.o. male who presents with dry gangrenous changes to the right foot. Patient is status post saphenous graft that has an occluded graft at this time. Patient is seen for evaluation for foot salvage interventions if revascularization would be an option.  Past Medical History  Diagnosis Date  . Hypertension   . Diabetes mellitus   . Schizophrenia, schizo-affective ([place])   . COPD (chronic obstructive pulmonary disease) ([place])   . Constipation   . Fecal impaction ([place])   . Pneumonia   . Anxiety    Past Surgical History  Procedure Laterality Date  . Peripheral vascular catheterization N/A 04/07/2015    Procedure: Abdominal Aortogram;  Surgeon: Angelia Mould, MD;  Location: [place] [facility];  Service: Cardiovascular;  Laterality: N/A;  . Aorta - bilateral femoral artery bypass graft Bilateral 04/23/2015    Procedure: AORTOBIFEMORAL BYPASS GRAFT;  Surgeon: Conrad Casper, MD;  Location: [facility];  Service: Vascular;  Laterality: Bilateral;  . Femoral-tibial bypass graft Right 04/23/2015    Procedure: Right Common Femoral to Posterior Tibial Bypass with Composite Vein Graft. ;  Surgeon: Conrad Silesia, MD;  Location: [place];  Service: Vascular;  Laterality: Right;  . Amputation Right 04/23/2015    Procedure: Right Fifth Toe Amputation;  Surgeon: Conrad Naugatuck, MD;  Location: [facility] OR;  Service: Vascular;  Laterality: Right;  . Femoral-femoral bypass graft Left 04/23/2015    Procedure:  Left Common Femoral Artery to Superficial Femoral Artery Bypass Graft with Vein;  Surgeon: Conrad Waelder, MD;  Location: [facility] OR;  Service: Vascular;  Laterality: Left;   Social History   Social History  . Marital Status: Single    Spouse Name: N/A  . Number of Children: N/A  . Years of Education: N/A   Social History Main Topics  . Smoking status: Current Every Day  Smoker -- 1.50 packs/day for 50 years    Types: Cigarettes  . Smokeless tobacco: Never Used  . Alcohol Use: No  . Drug Use: No  . Sexual Activity: No   Other Topics Concern  . None   Social History Narrative   Family History  Problem Relation Age of Onset  . Mental illness Mother    - negative except otherwise stated in the family history section No Known Allergies Prior to Admission medications   Medication Sig Start Date End Date Taking? Authorizing Provider  acetaminophen (TYLENOL) 325 MG tablet Take 650 mg by mouth every 6 (six) hours as needed for mild pain, fever or headache.    Yes Historical Provider, MD  albuterol (PROAIR HFA) 108 (90 Base) MCG/ACT inhaler Inhale 2 puffs into the lungs every 6 (six) hours as needed for shortness of breath (cough).   Yes Historical Provider, MD  albuterol (PROVENTIL) (2.5 MG/3ML) 0.083% nebulizer solution Take 3 mLs (2.5 mg total) by nebulization every 4 (four) hours as needed for wheezing. 02/19/15  Yes Theodis Blaze, MD  cloZAPine (CLOZARIL) 100 MG tablet Take 100-400 mg by mouth 3 (three) times daily. Take 1 tablet (100 mg) by mouth daily at 8am and 4pm, take 4 tablets (400 mg) at bedtime   Yes Historical Provider, MD  dicyclomine (BENTYL) 20 MG tablet Take 20 mg by mouth 2 (two) times daily as needed for spasms.   Yes Historical Provider, MD  donepezil (ARICEPT) 5 MG tablet Take 5  mg by mouth at bedtime.   Yes Historical Provider, MD  doxycycline (VIBRA-TABS) 100 MG tablet Take 100 mg by mouth 2 (two) times daily.   Yes Historical Provider, MD  gabapentin (NEURONTIN) 300 MG capsule Take 300 mg by mouth 3 (three) times daily.   Yes Historical Provider, MD  guaifenesin (ROBITUSSIN) 100 MG/5ML syrup Take 200 mg by mouth 4 (four) times daily as needed for cough.   Yes Historical Provider, MD  haloperidol (HALDOL) 5 MG tablet Take 5 mg by mouth at bedtime.   Yes Historical Provider, MD  hydrochlorothiazide (HYDRODIURIL) 25 MG tablet Take 25 mg by  mouth daily.   Yes Historical Provider, MD  ibuprofen (ADVIL,MOTRIN) 200 MG tablet Take 400 mg by mouth 2 (two) times daily as needed (pain).    Yes Historical Provider, MD  ipratropium-albuterol (DUONEB) 0.5-2.5 (3) MG/3ML SOLN Take 3 mLs by nebulization 3 (three) times daily. 02/19/15  Yes Theodis Blaze, MD  lactulose (CHRONULAC) 10 GM/15ML solution Take 30 g by mouth 2 (two) times daily.   Yes Historical Provider, MD  lisinopril (PRINIVIL,ZESTRIL) 20 MG tablet Take 20 mg by mouth daily.   Yes Historical Provider, MD  metFORMIN (GLUCOPHAGE) 500 MG tablet Take 500 mg by mouth 2 (two) times daily with a meal.   Yes Historical Provider, MD  metoprolol tartrate (LOPRESSOR) 25 MG tablet Take 25 mg by mouth 2 (two) times daily.   Yes Historical Provider, MD  Multiple Vitamin (MULITIVITAMIN WITH MINERALS) TABS Take 1 tablet by mouth daily.   Yes Historical Provider, MD  pantoprazole (PROTONIX) 40 MG tablet Take 1 tablet (40 mg total) by mouth daily. 02/20/15  Yes Theodis Blaze, MD  polyethylene glycol [facility] / Floria Raveling) packet Take 17 g by mouth daily as needed (constipation). Mix in 8 oz liquid and drink   Yes Historical Provider, MD  senna (SENOKOT) 8.6 MG TABS Take 1 tablet by mouth 2 (two) times daily.   Yes Historical Provider, MD  senna-docusate (SENOKOT-S) 8.6-50 MG tablet Take 1 tablet by mouth at bedtime as needed for mild constipation. 05/01/15  Yes Alvia Grove, PA-C  simvastatin (ZOCOR) 5 MG tablet Take 5 mg by mouth at bedtime.   Yes Historical Provider, MD  sulfamethoxazole-trimethoprim (BACTRIM DS,SEPTRA DS) 800-160 MG tablet Take 1 tablet by mouth 2 (two) times daily. 08/17/15 08/24/15 Yes Isla Pence, MD  trihexyphenidyl (ARTANE) 5 MG tablet Take 5 mg by mouth 2 (two) times daily with a meal.   Yes Historical Provider, MD  aspirin EC 81 MG tablet Take 1 tablet (81 mg total) by mouth daily. Patient not taking: Reported on 08/20/2015 05/01/15   Alvia Grove, PA-C  fentaNYL (DURAGESIC)  50 MCG/HR Place 1 patch (50 mcg total) onto the skin every 3 (three) days. Patient not taking: Reported on 08/20/2015 08/17/15   Isla Pence, MD  oxyCODONE-acetaminophen (PERCOCET/ROXICET) 5-325 MG tablet Take 1 tablet by mouth every 4 (four) hours as needed for moderate pain. Patient not taking: Reported on 08/20/2015 05/01/15   Alvia Grove, PA-C   Ct Head Wo Contrast  08/21/2015  CLINICAL DATA:  Acute mental status change. EXAM: CT HEAD WITHOUT CONTRAST TECHNIQUE: Contiguous axial images were obtained from the base of the skull through the vertex without intravenous contrast. COMPARISON:  CT of the brain June 10, 2009 FINDINGS: Paranasal sinuses, mastoid air cells, and bones are normal. Mild soft tissue swelling over the posterior calvarium near the vertex. Recommend clinical correlation. Extracranial soft tissues are otherwise  within normal limits. No subdural, epidural, or subarachnoid hemorrhage. The cerebellum, brainstem, and basal cisterns are within normal limits. Ventricles and sulci are unremarkable. No acute cortical ischemia or infarct. No mass, mass effect, or midline shift. IMPRESSION: No acute intracranial process. Electronically Signed   By: Dorise Bullion III M.D   On: 08/21/2015 23:07   Dg Chest Port 1 View  08/21/2015  CLINICAL DATA:  Pt arrives from [facility] via [facility]. Pt was seen at [facility] yesterday for generalized weakness, transported out today for same. Pt was hypotensive on scene and remains that way upon arrival. Diaphoretic. EXAM: PORTABLE CHEST 1 VIEW COMPARISON:  04/24/2015 FINDINGS: Heart size is within normal limits. There is minimal density at the left lung base which may represent residual atelectasis or scarring. Overall the appearance is improved since the prior study. The right lung is clear. No pulmonary edema. IMPRESSION: 1. Minimal left lower lobe density, which may represent chronic atelectasis or infiltrate. 2. Is hard to entirely exclude a recurrent left lower  lobe infectious process. Electronically Signed   By: Nolon Nations M.D.   On: 08/21/2015 16:15   Dg Foot Complete Right  08/21/2015  CLINICAL DATA:  RIGHT foot pain for an unknown period of time, unresponsive, diabetes mellitus, hypertension, peripheral vascular disease EXAM: RIGHT FOOT COMPLETE - 3+ VIEW COMPARISON:  04/02/2015 FINDINGS: Marked osseous demineralization. Joint spaces preserved. Dressing artifacts at the amputation bed from fifth toe amputation. Bone destruction of the fifth metatarsal head consistent with osteomyelitis. No fracture, dislocation, or additional bone destruction. Scattered soft tissue swelling RIGHT foot along the lateral and dorsal margins. IMPRESSION: Prior amputation of the fifth toe with destruction of the fifth metatarsal head consistent with osteomyelitis. Osseous demineralization. Electronically Signed   By: Lavonia Dana M.D.   On: 08/21/2015 17:12   - pertinent xrays, CT, MRI studies were reviewed and independently interpreted  Positive ROS: All other systems have been reviewed and were otherwise negative with the exception of those mentioned in the HPI and as above.  Physical Exam: General: Alert, no acute distress Cardiovascular: No pedal edema Respiratory: No cyanosis, no use of accessory musculature GI: No organomegaly, abdomen is soft and non-tender Skin: Dry gangrenous changes to the entire forefoot. There is ischemic ulcers over the ankle the foot has thin atrophic skin with decreased turgor. Neurologic: Patient does not have protective sensation. Psychiatric: Patient is competent for consent with normal mood and affect Lymphatic: No axillary or cervical lymphadenopathy  MUSCULOSKELETAL:  Examination patient has ischemic changes to the entire right foot and ankle. He does not have palpable pulses does not have resected sensation.  Assessment: Assessment: Dry gangrene right foot.  Plan: Patient's best option at this time would be a transtibial  amputation. With revascularization patient still would not have a viable foot. Patient may require an above-the-knee amputation but will attempt a transtibial amputation. Plan for surgery on Wednesday.  Thank you for the consult and the opportunity to see Craig Bentley, [place] [phone] 5:07 PM

## 2015-08-27 NOTE — Consult Note (Signed)
Physical Medicine and Rehabilitation Consult   Reason for Consult: R-BKA Referring Physician:  Dr. Sharol Given   HPI: Craig Bentley is a 60 y.o. 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 He was admitted on 08/21/15 with lethargy and hypotension felt to be related to fentanyl--improved with narcan. He was started on IVF and IV antibiotics due to sepsis and acute renal failure. Right foot not felt to be salvageable per Dr. Bridgett Larsson and Dr. Sharol Given. 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 Sharol Given. Therapy evaluations to be done today. CIR consulted in anticipation of extensive rehab needs.    Review of Systems  Constitutional: Negative for fever and chills.  HENT: Negative for hearing loss.   Eyes: Negative for blurred vision and double vision.  Respiratory: Positive for cough. Negative for shortness of breath.   Cardiovascular: Negative for chest pain, palpitations and leg swelling.  Gastrointestinal: Negative for heartburn, nausea, abdominal pain and diarrhea.  Genitourinary: Negative for dysuria.  Musculoskeletal: Negative for myalgias, back pain and joint pain.  Skin: Negative for itching and rash.  Neurological: Negative for headaches.  Psychiatric/Behavioral: The patient does not have insomnia.       Past Medical History  Diagnosis Date  . Hypertension   . Diabetes mellitus   . Schizophrenia, schizo-affective ([place])   . COPD (chronic obstructive pulmonary disease) ([place])   . Constipation   . Fecal impaction ([place])   . Pneumonia   . Anxiety     Past Surgical History  Procedure Laterality Date  . Peripheral vascular catheterization N/A 04/07/2015    Procedure: Abdominal Aortogram;  Surgeon: Angelia Mould, MD;  Location: [place] [facility];  Service: Cardiovascular;   Laterality: N/A;  . Aorta - bilateral femoral artery bypass graft Bilateral 04/23/2015    Procedure: AORTOBIFEMORAL BYPASS GRAFT;  Surgeon: Conrad Bowlegs, MD;  Location: [place];  Service: Vascular;  Laterality: Bilateral;  . Femoral-tibial bypass graft Right 04/23/2015    Procedure: Right Common Femoral to Posterior Tibial Bypass with Composite Vein Graft. ;  Surgeon: Conrad Eatons Neck, MD;  Location: [place];  Service: Vascular;  Laterality: Right;  . Amputation Right 04/23/2015    Procedure: Right Fifth Toe Amputation;  Surgeon: Conrad Hereford, MD;  [facility] OR;  Service: Vascular;  Laterality: Right;  . Femoral-femoral bypass graft Left 04/23/2015    Procedure:  Left Common Femoral Artery to Superficial Femoral Artery Bypass Graft with Vein;  Surgeon: Conrad Potosi, MD;  Location: [facility];  Service: Vascular;  Laterality: Left;    Family History  Problem Relation Age of Onset  . Mental illness Mother     Social History:   Lives at [place]. Was independent with cane PTA.  Family checks on him frequently. Had [facility] for wound care PTA. He reports that he has been smoking Cigarettes--1 PPD.  He has a 75 pack-year smoking history. He has never used smokeless tobacco. He reports that he does not drink alcohol or use illicit drugs.    Allergies: No Known Allergies    Medications Prior to Admission  Medication Sig Dispense Refill  . acetaminophen (TYLENOL) 325 MG tablet Take 650 mg by mouth every 6 (six) hours as needed for mild pain, fever or headache.     . albuterol (PROAIR HFA)  108 (90 Base) MCG/ACT inhaler Inhale 2 puffs into the lungs every 6 (six) hours as needed for shortness of breath (cough).    Marland Kitchen albuterol (PROVENTIL) (2.5 MG/3ML) 0.083% nebulizer solution Take 3 mLs (2.5 mg total) by nebulization every 4 (four) hours as needed for wheezing. 75 mL 12  . cloZAPine (CLOZARIL) 100 MG tablet Take 100-400 mg by mouth 3 (three) times daily. Take 1 tablet (100 mg) by mouth daily at 8am and 4pm, take 4  tablets (400 mg) at bedtime    . dicyclomine (BENTYL) 20 MG tablet Take 20 mg by mouth 2 (two) times daily as needed for spasms.    Marland Kitchen donepezil (ARICEPT) 5 MG tablet Take 5 mg by mouth at bedtime.    Marland Kitchen doxycycline (VIBRA-TABS) 100 MG tablet Take 100 mg by mouth 2 (two) times daily.    Marland Kitchen gabapentin (NEURONTIN) 300 MG capsule Take 300 mg by mouth 3 (three) times daily.    Marland Kitchen guaifenesin (ROBITUSSIN) 100 MG/5ML syrup Take 200 mg by mouth 4 (four) times daily as needed for cough.    . haloperidol (HALDOL) 5 MG tablet Take 5 mg by mouth at bedtime.    . hydrochlorothiazide (HYDRODIURIL) 25 MG tablet Take 25 mg by mouth daily.    Marland Kitchen ibuprofen (ADVIL,MOTRIN) 200 MG tablet Take 400 mg by mouth 2 (two) times daily as needed (pain).     Marland Kitchen ipratropium-albuterol (DUONEB) 0.5-2.5 (3) MG/3ML SOLN Take 3 mLs by nebulization 3 (three) times daily. 360 mL 1  . lactulose (CHRONULAC) 10 GM/15ML solution Take 30 g by mouth 2 (two) times daily.    Marland Kitchen lisinopril (PRINIVIL,ZESTRIL) 20 MG tablet Take 20 mg by mouth daily.    . metFORMIN (GLUCOPHAGE) 500 MG tablet Take 500 mg by mouth 2 (two) times daily with a meal.    . metoprolol tartrate (LOPRESSOR) 25 MG tablet Take 25 mg by mouth 2 (two) times daily.    . Multiple Vitamin (MULITIVITAMIN WITH MINERALS) TABS Take 1 tablet by mouth daily.    . pantoprazole (PROTONIX) 40 MG tablet Take 1 tablet (40 mg total) by mouth daily. 30 tablet 1  . polyethylene glycol (MIRALAX / GLYCOLAX) packet Take 17 g by mouth daily as needed (constipation). Mix in 8 oz liquid and drink    . senna (SENOKOT) 8.6 MG TABS Take 1 tablet by mouth 2 (two) times daily.    Marland Kitchen senna-docusate (SENOKOT-S) 8.6-50 MG tablet Take 1 tablet by mouth at bedtime as needed for mild constipation. 30 tablet 1  . simvastatin (ZOCOR) 5 MG tablet Take 5 mg by mouth at bedtime.    . [EXPIRED] sulfamethoxazole-trimethoprim (BACTRIM DS,SEPTRA DS) 800-160 MG tablet Take 1 tablet by mouth 2 (two) times daily. 14 tablet 0    . trihexyphenidyl (ARTANE) 5 MG tablet Take 5 mg by mouth 2 (two) times daily with a meal.    . aspirin EC 81 MG tablet Take 1 tablet (81 mg total) by mouth daily. (Patient not taking: Reported on 08/20/2015) 30 tablet 0  . fentaNYL (DURAGESIC) 50 MCG/HR Place 1 patch (50 mcg total) onto the skin every 3 (three) days. (Patient not taking: Reported on 08/20/2015) 5 patch 0  . oxyCODONE-acetaminophen (PERCOCET/ROXICET) 5-325 MG tablet Take 1 tablet by mouth every 4 (four) hours as needed for moderate pain. (Patient not taking: Reported on 08/20/2015) 30 tablet 0    Home: Home Living Family/patient expects to be discharged to:: Assisted living  Functional History:   Functional Status:  Mobility:  ADL:    Cognition: Cognition Orientation Level: Oriented X4    Blood pressure 118/82, pulse 92, temperature 99.4 F (37.4 C), temperature source Oral, resp. rate 16, height 5' 11.5" (1.816 m), weight 69.6 kg (153 lb 7 oz), SpO2 93 %. Physical Exam  Nursing note and vitals reviewed. Constitutional: He is oriented to person, place, and time. He appears well-developed and well-nourished.  HENT:  Head: Normocephalic and atraumatic.  Mouth/Throat: Oropharynx is clear and moist.  Eyes: Conjunctivae are normal. Pupils are equal, round, and reactive to light. No scleral icterus.  Neck: Normal range of motion. Neck supple.  Cardiovascular: Normal rate and regular rhythm.   No murmur heard. Respiratory: Breath sounds normal. No stridor. He has no wheezes.  GI: Bowel sounds are normal. He exhibits no distension. There is no tenderness.  Musculoskeletal: He exhibits no edema.  R-BKA with compressive sock and VAC in place. LLE with healing bypass incision and multiple healed abrasions.   Neurological: He is alert and oriented to person, place, and time.  Mild dysarthria. Able to follow basic commands without difficulty. Tardive dyskinesias --smacking movements of lips noted. Moves BUE and  LLE without difficulty.  No dysesthesias R-BKA -HF/KE 3+/5.  BUE/LLE 4/5 proximal to distal. Sensation intact.   Skin: Skin is warm and dry. He is not diaphoretic.    Results for orders placed or performed during the hospital encounter of 08/21/15 (from the past 24 hour(s))  Glucose, capillary     Status: None   Collection Time: 08/27/15 10:24 AM  Result Value Ref Range   Glucose-Capillary 94 65 - 99 mg/dL  Glucose, capillary     Status: Abnormal   Collection Time: 08/27/15 12:28 PM  Result Value Ref Range   Glucose-Capillary 157 (H) 65 - 99 mg/dL  Glucose, capillary     Status: Abnormal   Collection Time: 08/27/15  5:15 PM  Result Value Ref Range   Glucose-Capillary 124 (H) 65 - 99 mg/dL  Glucose, capillary     Status: Abnormal   Collection Time: 08/27/15  9:04 PM  Result Value Ref Range   Glucose-Capillary 137 (H) 65 - 99 mg/dL  Glucose, capillary     Status: Abnormal   Collection Time: 08/28/15  7:32 AM  Result Value Ref Range   Glucose-Capillary 195 (H) 65 - 99 mg/dL   No results found.  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 Does the need for close, 24 hr/day medical supervision in concert with the patient's rehab needs make it unreasonable for this patient to be served in a less intensive setting? Yes 2. Co-Morbidities requiring supervision/potential complications: ARF/COPD, DM2, schizophrenia, copd 3. Due to bladder management, bowel management, safety, skin/wound care, disease management, medication administration, pain management and patient education, does the patient require 24 hr/day rehab nursing? Yes 4. Does the patient require coordinated care of a physician, rehab nurse, PT (1-2 hrs/day, 5 days/week) and OT (1-2 hrs/day, 5 days/week) to address physical and functional deficits in the context of the above medical diagnosis(es)? Yes Addressing deficits in the following areas: balance, endurance, locomotion, strength,  transferring, bowel/bladder control, bathing, dressing, feeding, grooming, toileting and psychosocial support 5. Can the patient actively participate in an intensive therapy program of at least 3 hrs of therapy per day at least 5 days per week? Yes 6. The potential for patient to make measurable gains while on inpatient rehab is excellent 7. Anticipated functional outcomes upon discharge from inpatient rehab are modified independent and supervision  with PT, modified independent and supervision/occasional min assist? with OT, n/a with SLP. 8. Estimated rehab length of stay to reach the above functional goals is: potentially 12-15 days 9. Does the patient have adequate social supports and living environment to accommodate these discharge functional goals? Potentially 10. Anticipated D/C setting: Home 11. Anticipated post D/C treatments: HH therapy and Outpatient therapy 12. Overall Rehab/Functional Prognosis: excellent  RECOMMENDATIONS: This patient's condition is appropriate for continued rehabilitative care in the following setting: CIR Patient has agreed to participate in recommended program. Yes Note that insurance prior authorization may be required for reimbursement for recommended care.  Comment: Need to make sure his ALF could provide for projected supervision goals (I would think it could). Rehab Admissions Coordinator to follow up.  Thanks,  Meredith Staggers, MD, Mellody Drown     08/28/2015

## 2015-08-27 NOTE — Interval H&P Note (Signed)
History and Physical Interval Note:  08/27/2015 6:49 AM  Craig Bentley  has presented today for surgery, with the diagnosis of right foot osteomyelitis  The various methods of treatment have been discussed with the patient and family. After consideration of risks, benefits and other options for treatment, the patient has consented to  Procedure(s): RIGHT BELOW KNEE AMPUTATION  (Right) as a surgical intervention .  The patient's history has been reviewed, patient examined, no change in status, stable for surgery.  I have reviewed the patient's chart and labs.  Questions were answered to the patient's satisfaction.     Dinah Lupa V

## 2015-08-27 NOTE — Anesthesia Postprocedure Evaluation (Signed)
Anesthesia Post Note  Patient: Starr Engel  Procedure(s) Performed: Procedure(s) (LRB): RIGHT BELOW KNEE AMPUTATION  (Right)  Patient location during evaluation: PACU Anesthesia Type: Regional and MAC Level of consciousness: awake and alert Pain management: pain level controlled Vital Signs Assessment: post-procedure vital signs reviewed and stable Respiratory status: spontaneous breathing Cardiovascular status: stable Postop Assessment: no signs of nausea or vomiting Anesthetic complications: no    Last Vitals:  Filed Vitals:   08/27/15 1222 08/27/15 1237  BP: 149/81 142/64  Pulse: 73 71  Temp: 36.8 C   Resp: 12 14    Last Pain:  Filed Vitals:   08/27/15 1237  PainSc: Asleep                 Stevana Dufner

## 2015-08-27 NOTE — Progress Notes (Signed)
CRNA at bedside preparing to take pt back to OR.

## 2015-08-27 NOTE — Op Note (Signed)
   Date of Surgery: 08/27/2015  INDICATIONS: Mr. Dilauro is a 60 y.o.-year-old male who has severe peripheral vascular disease diabetic insensate neuropathy status post foot salvage intervention as well as revascularization with failure of foot salvage and presents at this time for transtibial amputation.Marland Kitchen  PREOPERATIVE DIAGNOSIS: Gangrenous dehiscence right foot with severe peripheral vascular disease and diabetes.  POSTOPERATIVE DIAGNOSIS: Same.  PROCEDURE: Transtibial amputation Application of Prevena wound VAC and compressive prosthetic shrinker  SURGEON: Sharol Given, M.D.  ANESTHESIA:  general  IV FLUIDS AND URINE: See anesthesia.  ESTIMATED BLOOD LOSS: Minimal mL.  COMPLICATIONS: None.  DESCRIPTION OF PROCEDURE: The patient was brought to the operating room and underwent a general anesthetic. After adequate levels of anesthesia were obtained patient's lower extremity was prepped using DuraPrep draped into a sterile field. A timeout was called. The foot was draped out of the sterile field with impervious stockinette. A transverse incision was made 11 cm distal to the tibial tubercle. This curved proximally and a large posterior flap was created. The tibia was transected 1 cm proximal to the skin incision. The fibula was transected just proximal to the tibial incision. The tibia was beveled anteriorly. A large posterior flap was created. The sciatic nerve was pulled cut and allowed to retract. The vascular bundles were suture ligated with 2-0 silk. The deep and superficial fascial layers were closed using #1 Vicryl. The skin was closed using staples and 2-0 nylon. The wound was covered with a Prevena wound VAC. There was a good suction fit. A prosthetic shrinker was applied over the [place] . Patient was extubated taken to the PACU in stable condition.  Meridee Score, MD [place] 12:23 PM

## 2015-08-28 ENCOUNTER — Inpatient Hospital Stay (HOSPITAL_COMMUNITY)
Admission: RE | Admit: 2015-08-28 | Discharge: 2015-09-09 | DRG: 560 | Disposition: A | Discharge status: Home / Self Care | Payer: Medicaid Other | Source: Intra-hospital | Attending: Physical Medicine & Rehabilitation | Admitting: Physical Medicine & Rehabilitation

## 2015-08-28 ENCOUNTER — Encounter (HOSPITAL_COMMUNITY): Payer: Self-pay | Admitting: Orthopedic Surgery

## 2015-08-28 DIAGNOSIS — F209 Schizophrenia, unspecified: Secondary | ICD-10-CM | POA: Diagnosis present

## 2015-08-28 DIAGNOSIS — J449 Chronic obstructive pulmonary disease, unspecified: Secondary | ICD-10-CM | POA: Diagnosis present

## 2015-08-28 DIAGNOSIS — F17210 Nicotine dependence, cigarettes, uncomplicated: Secondary | ICD-10-CM | POA: Diagnosis present

## 2015-08-28 DIAGNOSIS — I10 Essential (primary) hypertension: Secondary | ICD-10-CM | POA: Diagnosis present

## 2015-08-28 DIAGNOSIS — E1165 Type 2 diabetes mellitus with hyperglycemia: Secondary | ICD-10-CM | POA: Diagnosis present

## 2015-08-28 DIAGNOSIS — D72829 Elevated white blood cell count, unspecified: Secondary | ICD-10-CM | POA: Diagnosis not present

## 2015-08-28 DIAGNOSIS — Z79899 Other long term (current) drug therapy: Secondary | ICD-10-CM | POA: Diagnosis not present

## 2015-08-28 DIAGNOSIS — N179 Acute kidney failure, unspecified: Secondary | ICD-10-CM | POA: Diagnosis not present

## 2015-08-28 DIAGNOSIS — Z7982 Long term (current) use of aspirin: Secondary | ICD-10-CM

## 2015-08-28 DIAGNOSIS — R509 Fever, unspecified: Secondary | ICD-10-CM | POA: Insufficient documentation

## 2015-08-28 DIAGNOSIS — R918 Other nonspecific abnormal finding of lung field: Secondary | ICD-10-CM | POA: Insufficient documentation

## 2015-08-28 DIAGNOSIS — Z89511 Acquired absence of right leg below knee: Secondary | ICD-10-CM | POA: Diagnosis not present

## 2015-08-28 DIAGNOSIS — F0150 Vascular dementia without behavioral disturbance: Secondary | ICD-10-CM | POA: Diagnosis present

## 2015-08-28 DIAGNOSIS — Z4781 Encounter for orthopedic aftercare following surgical amputation: Principal | ICD-10-CM

## 2015-08-28 DIAGNOSIS — E0840 Diabetes mellitus due to underlying condition with diabetic neuropathy, unspecified: Secondary | ICD-10-CM | POA: Diagnosis not present

## 2015-08-28 DIAGNOSIS — E1149 Type 2 diabetes mellitus with other diabetic neurological complication: Secondary | ICD-10-CM | POA: Diagnosis present

## 2015-08-28 DIAGNOSIS — D62 Acute posthemorrhagic anemia: Secondary | ICD-10-CM | POA: Diagnosis present

## 2015-08-28 DIAGNOSIS — J410 Simple chronic bronchitis: Secondary | ICD-10-CM

## 2015-08-28 DIAGNOSIS — T8131XD Disruption of external operation (surgical) wound, not elsewhere classified, subsequent encounter: Secondary | ICD-10-CM | POA: Diagnosis not present

## 2015-08-28 DIAGNOSIS — T8131XA Disruption of external operation (surgical) wound, not elsewhere classified, initial encounter: Secondary | ICD-10-CM | POA: Diagnosis not present

## 2015-08-28 DIAGNOSIS — R6510 Systemic inflammatory response syndrome (SIRS) of non-infectious origin without acute organ dysfunction: Secondary | ICD-10-CM | POA: Insufficient documentation

## 2015-08-28 LAB — CBC WITH DIFFERENTIAL/PLATELET
Basophils Absolute: 0 10*3/uL (ref 0.0–0.1)
Basophils Relative: 0 %
EOS ABS: 0 10*3/uL (ref 0.0–0.7)
EOS PCT: 0 %
HCT: 31.7 % — ABNORMAL LOW (ref 39.0–52.0)
Hemoglobin: 9.6 g/dL — ABNORMAL LOW (ref 13.0–17.0)
LYMPHS ABS: 1.6 10*3/uL (ref 0.7–4.0)
Lymphocytes Relative: 12 %
MCH: 25.9 pg — AB (ref 26.0–34.0)
MCHC: 30.3 g/dL (ref 30.0–36.0)
MCV: 85.7 fL (ref 78.0–100.0)
MONOS PCT: 7 %
Monocytes Absolute: 0.9 10*3/uL (ref 0.1–1.0)
Neutro Abs: 10.7 10*3/uL — ABNORMAL HIGH (ref 1.7–7.7)
Neutrophils Relative %: 81 %
PLATELETS: 360 10*3/uL (ref 150–400)
RBC: 3.7 MIL/uL — ABNORMAL LOW (ref 4.22–5.81)
RDW: 14.7 % (ref 11.5–15.5)
WBC: 13.2 10*3/uL — ABNORMAL HIGH (ref 4.0–10.5)

## 2015-08-28 LAB — CBC
HCT: 31.5 % — ABNORMAL LOW (ref 39.0–52.0)
Hemoglobin: 9.6 g/dL — ABNORMAL LOW (ref 13.0–17.0)
MCH: 26.2 pg (ref 26.0–34.0)
MCHC: 30.5 g/dL (ref 30.0–36.0)
MCV: 85.8 fL (ref 78.0–100.0)
PLATELETS: 383 10*3/uL (ref 150–400)
RBC: 3.67 MIL/uL — ABNORMAL LOW (ref 4.22–5.81)
RDW: 14.6 % (ref 11.5–15.5)
WBC: 13.9 10*3/uL — AB (ref 4.0–10.5)

## 2015-08-28 LAB — BASIC METABOLIC PANEL
Anion gap: 8 (ref 5–15)
BUN: 9 mg/dL (ref 6–20)
CHLORIDE: 104 mmol/L (ref 101–111)
CO2: 26 mmol/L (ref 22–32)
CREATININE: 0.61 mg/dL (ref 0.61–1.24)
Calcium: 9.1 mg/dL (ref 8.9–10.3)
GFR calc Af Amer: >60 mL/min (ref >60)
GFR calc non Af Amer: >60 mL/min (ref >60)
GLUCOSE: 153 mg/dL — AB (ref 65–99)
Potassium: 4 mmol/L (ref 3.5–5.1)
Sodium: 138 mmol/L (ref 135–145)

## 2015-08-28 LAB — GLUCOSE, CAPILLARY
GLUCOSE-CAPILLARY: 154 mg/dL — AB (ref 65–99)
GLUCOSE-CAPILLARY: 164 mg/dL — AB (ref 65–99)
Glucose-Capillary: 195 mg/dL — ABNORMAL HIGH (ref 65–99)
Glucose-Capillary: 153 mg/dL — ABNORMAL HIGH (ref 65–99)

## 2015-08-28 MED ORDER — TRIHEXYPHENIDYL HCL 5 MG PO TABS: 5.0000 mg | ORAL_TABLET | Freq: Two times a day (BID) | ORAL | Status: DC

## 2015-08-28 MED ORDER — METOPROLOL TARTRATE 25 MG PO TABS
25.0000 mg | ORAL_TABLET | Freq: Two times a day (BID) | ORAL | Status: DC
  Administered 2015-08-28 – 2015-09-09 (×24): 25 mg via ORAL
  Filled 2015-08-28 (×24): qty 1

## 2015-08-28 MED ORDER — TRAZODONE HCL 50 MG PO TABS
25.0000 mg | ORAL_TABLET | Freq: Every evening | ORAL | Status: DC | PRN
  Administered 2015-09-06 – 2015-09-07 (×2): 50 mg via ORAL
  Filled 2015-08-28 (×2): qty 1

## 2015-08-28 MED ORDER — DICYCLOMINE HCL 20 MG PO TABS
20.0000 mg | ORAL_TABLET | Freq: Two times a day (BID) | ORAL | Status: DC | PRN
  Filled 2015-08-28: qty 1

## 2015-08-28 MED ORDER — GABAPENTIN 300 MG PO CAPS
300.0000 mg | ORAL_CAPSULE | Freq: Three times a day (TID) | ORAL | Status: DC
Start: 2015-08-28 — End: 2015-09-09
  Administered 2015-08-28 – 2015-09-09 (×36): 300 mg via ORAL
  Filled 2015-08-28 (×36): qty 1

## 2015-08-28 MED ORDER — ACETAMINOPHEN 325 MG PO TABS
650.0000 mg | ORAL_TABLET | Freq: Four times a day (QID) | ORAL | Status: DC | PRN
  Administered 2015-08-29: 650 mg via ORAL
  Filled 2015-08-28: qty 2

## 2015-08-28 MED ORDER — CLOZAPINE 100 MG PO TABS
100.0000 mg | ORAL_TABLET | ORAL | Status: DC
  Administered 2015-08-29 – 2015-09-09 (×23): 100 mg via ORAL
  Filled 2015-08-28 (×24): qty 1

## 2015-08-28 MED ORDER — IPRATROPIUM-ALBUTEROL 0.5-2.5 (3) MG/3ML IN SOLN: 3.0000 mL | RESPIRATORY_TRACT | Status: DC | PRN

## 2015-08-28 MED ORDER — HALOPERIDOL 5 MG PO TABS: 5.0000 mg | ORAL_TABLET | Freq: Every day | ORAL | Status: AC

## 2015-08-28 MED ORDER — TRAMADOL HCL 50 MG PO TABS: 50.0000 mg | ORAL_TABLET | Freq: Four times a day (QID) | ORAL | Status: DC | PRN

## 2015-08-28 MED ORDER — FLEET ENEMA 7-19 GM/118ML RE ENEM: 1.0000 | ENEMA | Freq: Once | RECTAL | Status: DC | PRN

## 2015-08-28 MED ORDER — DIPHENHYDRAMINE HCL 12.5 MG/5ML PO ELIX: 12.5000 mg | ORAL_SOLUTION | Freq: Four times a day (QID) | ORAL | Status: DC | PRN

## 2015-08-28 MED ORDER — CLOZAPINE 100 MG PO TABS
400.0000 mg | ORAL_TABLET | Freq: Every day | ORAL | Status: DC
  Administered 2015-08-28 – 2015-09-08 (×12): 400 mg via ORAL
  Filled 2015-08-28 (×13): qty 4

## 2015-08-28 MED ORDER — ALUM & MAG HYDROXIDE-SIMETH 200-200-20 MG/5ML PO SUSP: 30.0000 mL | ORAL | Status: DC | PRN

## 2015-08-28 MED ORDER — HALOPERIDOL 5 MG PO TABS
5.0000 mg | ORAL_TABLET | Freq: Every day | ORAL | Status: DC
  Administered 2015-08-28 – 2015-09-08 (×12): 5 mg via ORAL
  Filled 2015-08-28 (×12): qty 1

## 2015-08-28 MED ORDER — DONEPEZIL HCL 10 MG PO TABS
5.0000 mg | ORAL_TABLET | Freq: Every day | ORAL | Status: DC
  Administered 2015-08-28 – 2015-09-08 (×12): 5 mg via ORAL
  Filled 2015-08-28 (×16): qty 1

## 2015-08-28 MED ORDER — INSULIN ASPART 100 UNIT/ML ~~LOC~~ SOLN
0.0000 [IU] | Freq: Three times a day (TID) | SUBCUTANEOUS | Status: DC
  Administered 2015-08-28: 2 [IU] via SUBCUTANEOUS
  Administered 2015-08-29 – 2015-08-30 (×3): 1 [IU] via SUBCUTANEOUS
  Administered 2015-08-30 – 2015-08-31 (×3): 2 [IU] via SUBCUTANEOUS
  Administered 2015-09-01 – 2015-09-02 (×4): 1 [IU] via SUBCUTANEOUS
  Administered 2015-09-03 – 2015-09-04 (×2): 2 [IU] via SUBCUTANEOUS
  Administered 2015-09-05 – 2015-09-06 (×4): 1 [IU] via SUBCUTANEOUS
  Administered 2015-09-06: 2 [IU] via SUBCUTANEOUS
  Administered 2015-09-07 – 2015-09-08 (×3): 1 [IU] via SUBCUTANEOUS
  Administered 2015-09-08: 2 [IU] via SUBCUTANEOUS
  Administered 2015-09-09: 1 [IU] via SUBCUTANEOUS

## 2015-08-28 MED ORDER — ACETAMINOPHEN 650 MG RE SUPP: 650.0000 mg | Freq: Four times a day (QID) | RECTAL | Status: DC | PRN

## 2015-08-28 MED ORDER — ENOXAPARIN SODIUM 40 MG/0.4ML ~~LOC~~ SOLN
40.0000 mg | SUBCUTANEOUS | Status: DC
  Administered 2015-08-28 – 2015-09-08 (×12): 40 mg via SUBCUTANEOUS
  Filled 2015-08-28 (×12): qty 0.4

## 2015-08-28 MED ORDER — ONDANSETRON HCL 4 MG PO TABS
4.0000 mg | ORAL_TABLET | Freq: Four times a day (QID) | ORAL | Status: DC | PRN
Start: 2015-08-28 — End: 2015-09-09

## 2015-08-28 MED ORDER — LACTULOSE 10 GM/15ML PO SOLN
30.0000 g | Freq: Two times a day (BID) | ORAL | Status: DC
  Administered 2015-08-28 – 2015-09-09 (×14): 30 g via ORAL
  Filled 2015-08-28 (×19): qty 45

## 2015-08-28 MED ORDER — CHLORHEXIDINE GLUCONATE CLOTH 2 % EX PADS
6.0000 | MEDICATED_PAD | Freq: Every day | CUTANEOUS | Status: AC
  Administered 2015-08-29 – 2015-08-31 (×3): 6 via TOPICAL

## 2015-08-28 MED ORDER — OXYCODONE-ACETAMINOPHEN 5-325 MG PO TABS: 1.0000 | ORAL_TABLET | Freq: Four times a day (QID) | ORAL | Status: DC | PRN

## 2015-08-28 MED ORDER — MUPIROCIN 2 % EX OINT
1.0000 "application " | TOPICAL_OINTMENT | Freq: Two times a day (BID) | CUTANEOUS | Status: AC
  Administered 2015-08-28 – 2015-08-31 (×6): 1 via NASAL
  Filled 2015-08-28: qty 22

## 2015-08-28 MED ORDER — GUAIFENESIN 100 MG/5ML PO SOLN: 10.0000 mL | Freq: Four times a day (QID) | ORAL | Status: DC | PRN

## 2015-08-28 MED ORDER — HYDROCERIN EX CREA
TOPICAL_CREAM | Freq: Two times a day (BID) | CUTANEOUS | Status: DC
  Administered 2015-08-28 – 2015-08-29 (×2): via TOPICAL
  Administered 2015-08-29: 1 via TOPICAL
  Administered 2015-08-30 – 2015-09-01 (×5): via TOPICAL
  Administered 2015-09-02 (×2): 1 via TOPICAL
  Administered 2015-09-03 – 2015-09-06 (×7): via TOPICAL
  Administered 2015-09-07: 1 via TOPICAL
  Administered 2015-09-07 – 2015-09-09 (×4): via TOPICAL
  Filled 2015-08-28: qty 113

## 2015-08-28 MED ORDER — SENNOSIDES-DOCUSATE SODIUM 8.6-50 MG PO TABS
2.0000 | ORAL_TABLET | Freq: Every day | ORAL | Status: DC
  Administered 2015-08-28 – 2015-09-08 (×9): 2 via ORAL
  Filled 2015-08-28 (×10): qty 2

## 2015-08-28 MED ORDER — DOXYCYCLINE HYCLATE 100 MG PO TABS: 100.0000 mg | ORAL_TABLET | Freq: Two times a day (BID) | ORAL | Status: DC

## 2015-08-28 MED ORDER — HEPARIN SODIUM (PORCINE) 5000 UNIT/ML IJ SOLN: 5000.0000 [IU] | Freq: Three times a day (TID) | INTRAMUSCULAR | Status: DC

## 2015-08-28 MED ORDER — BISACODYL 5 MG PO TBEC
5.0000 mg | DELAYED_RELEASE_TABLET | Freq: Every day | ORAL | Status: DC | PRN
  Administered 2015-08-29 – 2015-09-06 (×3): 5 mg via ORAL
  Filled 2015-08-28 (×3): qty 1

## 2015-08-28 MED ORDER — PANTOPRAZOLE SODIUM 40 MG PO TBEC
40.0000 mg | DELAYED_RELEASE_TABLET | Freq: Every day | ORAL | Status: DC
  Administered 2015-08-29 – 2015-09-09 (×12): 40 mg via ORAL
  Filled 2015-08-28 (×12): qty 1

## 2015-08-28 MED ORDER — TRIHEXYPHENIDYL HCL 5 MG PO TABS
5.0000 mg | ORAL_TABLET | Freq: Two times a day (BID) | ORAL | Status: DC
  Administered 2015-08-28 – 2015-09-09 (×24): 5 mg via ORAL
  Filled 2015-08-28 (×25): qty 1

## 2015-08-28 MED ORDER — DOXYCYCLINE HYCLATE 100 MG PO TABS
100.0000 mg | ORAL_TABLET | Freq: Two times a day (BID) | ORAL | Status: DC
  Administered 2015-08-28 – 2015-09-09 (×24): 100 mg via ORAL
  Filled 2015-08-28 (×24): qty 1

## 2015-08-28 MED ORDER — GUAIFENESIN-DM 100-10 MG/5ML PO SYRP: 5.0000 mL | ORAL_SOLUTION | Freq: Four times a day (QID) | ORAL | Status: DC | PRN

## 2015-08-28 MED ORDER — DOXYCYCLINE HYCLATE 100 MG PO TABS
100.0000 mg | ORAL_TABLET | Freq: Two times a day (BID) | ORAL | Status: DC
  Administered 2015-08-28: 100 mg via ORAL
  Filled 2015-08-28: qty 1

## 2015-08-28 MED ORDER — ONDANSETRON HCL 4 MG/2ML IJ SOLN: 4.0000 mg | Freq: Four times a day (QID) | INTRAMUSCULAR | Status: DC | PRN

## 2015-08-28 MED ORDER — TRAMADOL HCL 50 MG PO TABS
50.0000 mg | ORAL_TABLET | Freq: Four times a day (QID) | ORAL | Status: DC | PRN
  Administered 2015-09-01 – 2015-09-09 (×8): 50 mg via ORAL
  Filled 2015-08-28 (×8): qty 1

## 2015-08-28 MED ORDER — SIMVASTATIN 5 MG PO TABS
5.0000 mg | ORAL_TABLET | Freq: Every day | ORAL | Status: DC
  Administered 2015-08-28 – 2015-09-08 (×12): 5 mg via ORAL
  Filled 2015-08-28 (×12): qty 1

## 2015-08-28 MED ORDER — METHOCARBAMOL 500 MG PO TABS: 500.0000 mg | ORAL_TABLET | Freq: Four times a day (QID) | ORAL | Status: DC | PRN

## 2015-08-28 MED ORDER — OXYCODONE HCL 5 MG PO TABS
5.0000 mg | ORAL_TABLET | ORAL | Status: DC | PRN
  Administered 2015-08-28 – 2015-08-31 (×5): 10 mg via ORAL
  Administered 2015-09-02: 5 mg via ORAL
  Administered 2015-09-03: 10 mg via ORAL
  Administered 2015-09-04 – 2015-09-05 (×3): 5 mg via ORAL
  Administered 2015-09-06 – 2015-09-08 (×2): 10 mg via ORAL
  Filled 2015-08-28 (×2): qty 1
  Filled 2015-08-28 (×5): qty 2
  Filled 2015-08-28: qty 1
  Filled 2015-08-28 (×3): qty 2
  Filled 2015-08-28: qty 1

## 2015-08-28 MED ORDER — CLOZAPINE 100 MG PO TABS: 100.0000 mg | ORAL_TABLET | Freq: Three times a day (TID) | ORAL | Status: AC

## 2015-08-28 MED ORDER — ACETAMINOPHEN 325 MG PO TABS: 325.0000 mg | ORAL_TABLET | ORAL | Status: DC | PRN

## 2015-08-28 NOTE — Progress Notes (Signed)
Rehab admissions - I met with patient today.  He is agreeable to inpatient rehab admission.  Bed available on rehab today and I will admit to acute inpatient rehab today.  Call me for questions.  #[phone]

## 2015-08-28 NOTE — Progress Notes (Signed)
[facility]                                                                              Patient Demographics  Craig Bentley, is a 60 y.o. male, DOB - 1955-12-15, ZOX:[id number]  Admit date - 08/21/2015   Admitting Physician Craig Clos, MD  Outpatient Primary MD for the patient is Craig Langton, NP  Outpatient specialists:   LOS - 7  days    Chief Complaint  Patient presents with  . Fatigue       Brief summary   60 y.o. male with past medical history significant for peripheral vascular disease status post aorto femoral bypass and right foot fifth toe amputation in March 2017, hypertension, diabetes mellitus, hyperlipidemia and COPD. Patient was brought from his living facility to ER because of more confusion felt to be because of fentanyl patch which was subsequently discontinued. In ER, patient was found to be hypotensive, febrile with Tmax 102F, tachycardic in addition to having leukocytosis and x-ray findings of right foot possible osteomyelitis. Patient was seen by vascular surgery as well as orthopedic surgery. Plan is for transtibial amputation 08/26/2012.   Assessment & Plan   Principal Problem: Sepsis secondary to right foot MRSA diabetic osteomyelitis: Postop day #1 - Patient met criteria for sepsis on admission given fever, tachycardia and hypotension as well as source being infection seen on plain films. Sepsis since stabilized with IV fluids and antibiotics.  - Vascular surgery, orthopedic surgery consulted. - Wound culture positive for MRSA, blood cultures negative so far. Transition IV vancomycin and Zosyn to oral doxycycline - postop day #1, H&H stable  Active Problems:  Essential hypertension:  - Stable. On metoprolol 25 twice a day.   COPD (chronic obstructive pulmonary disease) (HCC): -  Stable, no wheezing. Breathing comfortably on room air.   Diabetes mellitus with neurologic complication, without long-term current use of  insulin (HCC): - CBG stable, under 180. Continue Neurontin.   PVD (peripheral vascular disease) (HCC) status post aortofemoral bypass:- R CFA to PTA with composite graft, L CFA To SFA BPG w/ ips reversed GSV, R 5th toe amputation (Date: 04/25/15) - Vasc Surgery following    Schizophrenia (HCC): Stable. Patient on Aricept, Haldol and clozapine.   ARF (acute renal failure) (HCC)/acute kidney injury:  - Stable. Creatinine on admission at 1.51, now normalized with IV fluids. Secondary to initial sepsis.   Acute encephalopathy:  - Likely in the setting of osteomyelitis. Has since resolved  Code Status: full  DVT Prophylaxis:  SCD's Family Communication: Discussed in detail with the patient, all imaging results, lab results explained to the patient   Disposition Plan: CIR vs SNF, PT OT evaluation   Time Spent in minutes   25 minutes  Procedures:  Planned transtibial amputation 7/5  Consultants:   Orthopedics Vascular surgery  Antimicrobials:   IV vancomycin 6/29 >7/6  IV Zosyn 6/29>7/6   oral doxycycline 7/6 >   Medications  Scheduled Meds: . Chlorhexidine Gluconate Cloth  6 each Topical Q0600  . cloZAPine  100 mg Oral 2 times per day  . cloZAPine  400 mg Oral QHS  . donepezil  5 mg Oral QHS  . doxycycline  100 mg Oral Q12H  . gabapentin  300 mg Oral TID  . haloperidol  5 mg Oral QHS  . heparin subcutaneous  5,000 Units Subcutaneous Q8H  . insulin aspart  0-9 Units Subcutaneous TID WC  . lactulose  30 g Oral BID  . metoprolol tartrate  25 mg Oral BID  . mupirocin ointment  1 application Nasal BID  . pantoprazole  40 mg Oral Daily  . simvastatin  5 mg Oral QHS  . trihexyphenidyl  5 mg Oral BID WC   Continuous Infusions: . sodium chloride    . lactated ringers Stopped (08/27/15 1217)   PRN Meds:.acetaminophen **OR** acetaminophen, acetaminophen **OR** acetaminophen, dicyclomine, guaiFENesin, hydrALAZINE, HYDROmorphone (DILAUDID) injection,  ipratropium-albuterol, methocarbamol **OR** methocarbamol (ROBAXIN)  IV, metoCLOPramide **OR** metoCLOPramide (REGLAN) injection, ondansetron **OR** ondansetron (ZOFRAN) IV, ondansetron **OR** ondansetron (ZOFRAN) IV, oxyCODONE, oxyCODONE-acetaminophen, traMADol   Antibiotics   Anti-infectives    Start     Dose/Rate Route Frequency Ordered Stop   08/28/15 1100  doxycycline (VIBRA-TABS) tablet 100 mg     100 mg Oral Every 12 hours 08/28/15 1020     08/28/15 0000  doxycycline (VIBRA-TABS) 100 MG tablet     100 mg Oral 2 times daily 08/28/15 1024     08/27/15 1100  ceFAZolin (ANCEF) IVPB 2g/100 mL premix     2 g 200 mL/hr over 30 Minutes Intravenous To [facility] 08/25/15 1001 08/27/15 1140   08/25/15 0700  vancomycin (VANCOCIN) 1,500 mg in sodium chloride 0.9 % 500 mL IVPB  Status:  Discontinued     1,500 mg 250 mL/hr over 120 Minutes Intravenous Every 12 hours 08/24/15 1928 08/28/15 1020   08/22/15 0700  vancomycin (VANCOCIN) IVPB 750 mg/150 ml premix  Status:  Discontinued     750 mg 150 mL/hr over 60 Minutes Intravenous Every 12 hours 08/21/15 2157 08/24/15 1928   08/22/15 0100  piperacillin-tazobactam (ZOSYN) IVPB 3.375 g  Status:  Discontinued     3.375 g 12.5 mL/hr over 240 Minutes Intravenous Every 8 hours 08/21/15 2157 08/28/15 1020   08/21/15 1845  vancomycin (VANCOCIN) IVPB 1000 mg/200 mL premix     1,000 mg 200 mL/hr over 60 Minutes Intravenous  Once 08/21/15 1840 08/21/15 2133   08/21/15 1845  piperacillin-tazobactam (ZOSYN) IVPB 3.375 g     3.375 g 100 mL/hr over 30 Minutes Intravenous  Once 08/21/15 1840 08/21/15 2050        Subjective:   Craig Bentley was seen and examined today. Denies any complaints this morning, Patient denies dizziness, chest pain, shortness of breath, abdominal pain, N/V/D/C, new weakness, numbess, tingling. No acute events overnight. pain controlled.    Objective:   Filed Vitals:   08/27/15 1237 08/27/15 2106 08/28/15 0500 08/28/15  0614  BP: 142/64 150/62  118/82  Pulse: 71 84  92  Temp:  99.6 F (37.6 C)  99.4 F (37.4 C)  TempSrc:  Oral  Oral  Resp: 14 16  16   Height:      Weight:   69.6 kg (153 lb 7 oz)   SpO2: 98% 97%  93%    Intake/Output Summary (Last 24 hours) at 08/28/15 1139 Last data filed at 08/28/15 0555  Gross per 24 hour  Intake   2740 ml  Output   2100 ml  Net    640 ml     Wt Readings from Last 3 Encounters:  08/28/15 69.6 kg (153 lb 7 oz)  05/16/15  78.926 kg (174 lb)  04/24/15 83.2 kg (183 lb 6.8 oz)     Exam  General: Alert and oriented x 3, NAD  HEENT:     Neck:  Cardiovascular: S1 S2 auscultated, no rubs, murmurs or gallops. Regular rate and rhythm.  Respiratory: Clear to auscultation bilaterally, no wheezing, rales or rhonchi  Gastrointestinal: Soft, nontender, nondistended, + bowel sounds  Ext: no cyanosis clubbing or edema. Right BKA  Neuro: no new deficits  Skin:   Psych: Normal affect and demeanor, alert and oriented x3    Data Reviewed:  I have personally reviewed following labs and imaging studies  Micro Results Recent Results (from the past 240 hour(s))  Urine culture     Status: None   Collection Time: 08/21/15  6:54 PM  Result Value Ref Range Status   Specimen Description URINE, CLEAN CATCH  Final   Special Requests NONE  Final   Culture NO GROWTH  Final   Report Status 08/23/2015 FINAL  Final  Blood culture (routine x 2)     Status: None   Collection Time: 08/21/15  7:09 PM  Result Value Ref Range Status   Specimen Description BLOOD RIGHT HAND  Final   Special Requests IN PEDIATRIC BOTTLE 5CC  Final   Culture NO GROWTH 5 DAYS  Final   Report Status 08/26/2015 FINAL  Final  Blood culture (routine x 2)     Status: None   Collection Time: 08/21/15  7:53 PM  Result Value Ref Range Status   Specimen Description BLOOD RIGHT HAND  Final   Special Requests IN PEDIATRIC BOTTLE 1CC  Final   Culture NO GROWTH 5 DAYS  Final   Report Status 08/26/2015  FINAL  Final  MRSA PCR Screening     Status: None   Collection Time: 08/22/15 12:10 AM  Result Value Ref Range Status   MRSA by PCR NEGATIVE NEGATIVE Final    Comment:        The GeneXpert MRSA Assay (FDA approved for NASAL specimens only), is one component of a comprehensive MRSA colonization surveillance program. It is not intended to diagnose MRSA infection nor to guide or monitor treatment for MRSA infections.   Surgical PCR screen     Status: None   Collection Time: 08/23/15  6:06 AM  Result Value Ref Range Status   MRSA, PCR NEGATIVE NEGATIVE Final   Staphylococcus aureus NEGATIVE NEGATIVE Final    Comment:        The Xpert SA Assay (FDA approved for NASAL specimens in patients over 21 years of age), is one component of a comprehensive surveillance program.  Test performance has been validated by [facility] for patients greater than or equal to 70 year old. It is not intended to diagnose infection nor to guide or monitor treatment.   Surgical pcr screen     Status: Abnormal   Collection Time: 08/26/15 12:11 PM  Result Value Ref Range Status   MRSA, PCR POSITIVE (A) NEGATIVE Final    Comment: RESULT CALLED TO, READ BACK BY AND VERIFIED WITH: C HANCOCK 08/26/15 @ 1440 M VESTAL    Staphylococcus aureus POSITIVE (A) NEGATIVE Final    Comment:        The Xpert SA Assay (FDA approved for NASAL specimens in patients over 74 years of age), is one component of a comprehensive surveillance program.  Test performance has been validated by [facility] for patients greater than or equal to 35 year old. It is not intended  to diagnose infection nor to guide or monitor treatment.     Radiology Reports Ct Head Wo Contrast  08/21/2015  CLINICAL DATA:  Acute mental status change. EXAM: CT HEAD WITHOUT CONTRAST TECHNIQUE: Contiguous axial images were obtained from the base of the skull through the vertex without intravenous contrast. COMPARISON:  CT of the brain June 10, 2009 FINDINGS: Paranasal sinuses, mastoid air cells, and bones are normal. Mild soft tissue swelling over the posterior calvarium near the vertex. Recommend clinical correlation. Extracranial soft tissues are otherwise within normal limits. No subdural, epidural, or subarachnoid hemorrhage. The cerebellum, brainstem, and basal cisterns are within normal limits. Ventricles and sulci are unremarkable. No acute cortical ischemia or infarct. No mass, mass effect, or midline shift. IMPRESSION: No acute intracranial process. Electronically Signed   By: Gerome Sam III M.D   On: 08/21/2015 23:07   Dg Chest Port 1 View  08/21/2015  CLINICAL DATA:  Pt arrives from [facility] via [facility]. Pt was seen at [facility] yesterday for generalized weakness, transported out today for same. Pt was hypotensive on scene and remains that way upon arrival. Diaphoretic. EXAM: PORTABLE CHEST 1 VIEW COMPARISON:  04/24/2015 FINDINGS: Heart size is within normal limits. There is minimal density at the left lung base which may represent residual atelectasis or scarring. Overall the appearance is improved since the prior study. The right lung is clear. No pulmonary edema. IMPRESSION: 1. Minimal left lower lobe density, which may represent chronic atelectasis or infiltrate. 2. Is hard to entirely exclude a recurrent left lower lobe infectious process. Electronically Signed   By: Norva Pavlov M.D.   On: 08/21/2015 16:15   Dg Foot Complete Right  08/21/2015  CLINICAL DATA:  RIGHT foot pain for an unknown period of time, unresponsive, diabetes mellitus, hypertension, peripheral vascular disease EXAM: RIGHT FOOT COMPLETE - 3+ VIEW COMPARISON:  04/02/2015 FINDINGS: Marked osseous demineralization. Joint spaces preserved. Dressing artifacts at the amputation bed from fifth toe amputation. Bone destruction of the fifth metatarsal head consistent with osteomyelitis. No fracture, dislocation, or additional bone destruction. Scattered soft tissue  swelling RIGHT foot along the lateral and dorsal margins. IMPRESSION: Prior amputation of the fifth toe with destruction of the fifth metatarsal head consistent with osteomyelitis. Osseous demineralization. Electronically Signed   By: Ulyses Southward M.D.   On: 08/21/2015 17:12    Lab Data:  CBC:  Recent Labs Lab 08/21/15 1735 08/22/15 0312 08/23/15 1507 08/28/15 1037  WBC 11.9* 10.9* 7.0 13.9*  13.2*  NEUTROABS 9.8* 9.5*  --  10.7*  HGB 9.1* 9.1* 9.6* 9.6*  9.6*  HCT 29.3* 29.5* 31.0* 31.5*  31.7*  MCV 84.2 85.5 84.0 85.8  85.7  PLT 242 265 305 383  360   Basic Metabolic Panel:  Recent Labs Lab 08/22/15 0312 08/23/15 1507 08/26/15 0529 08/27/15 0605 08/28/15 1037  NA 137 137 137 140 138  K 4.0 3.9 4.2 3.8 4.0  CL 105 103 102 105 104  CO2 25 27 28 28 26   GLUCOSE 172* 149* 118* 115* 153*  BUN 30* 10 10 10 9   CREATININE 1.05 0.88 0.67 0.71 0.61  CALCIUM 8.4* 8.9 9.3 9.0 9.1   GFR: Estimated Creatinine Clearance: 96.7 mL/min (by C-G formula based on Cr of 0.61). Liver Function Tests:  Recent Labs Lab 08/21/15 1735 08/22/15 0312  AST 24 25  ALT 21 25  ALKPHOS 48 55  BILITOT 0.6 0.7  PROT 5.3* 6.1*  ALBUMIN 2.0* 2.2*   No results for input(s):  LIPASE, AMYLASE in the last 168 hours.  Recent Labs Lab 08/22/15 0413  AMMONIA 32   Coagulation Profile: No results for input(s): INR, PROTIME in the last 168 hours. Cardiac Enzymes: No results for input(s): CKTOTAL, CKMB, CKMBINDEX, TROPONINI in the last 168 hours. BNP (last 3 results) No results for input(s): PROBNP in the last 8760 hours. HbA1C: No results for input(s): HGBA1C in the last 72 hours. CBG:  Recent Labs Lab 08/27/15 1024 08/27/15 1228 08/27/15 1715 08/27/15 2104 08/28/15 0732  GLUCAP 94 157* 124* 137* 195*   Lipid Profile: No results for input(s): CHOL, HDL, LDLCALC, TRIG, CHOLHDL, LDLDIRECT in the last 72 hours. Thyroid Function Tests: No results for input(s): TSH, T4TOTAL, FREET4,  T3FREE, THYROIDAB in the last 72 hours. Anemia Panel: No results for input(s): VITAMINB12, FOLATE, FERRITIN, TIBC, IRON, RETICCTPCT in the last 72 hours. Urine analysis:    Component Value Date/Time   COLORURINE AMBER* 08/21/2015 1854   APPEARANCEUR CLEAR 08/21/2015 1854   LABSPEC 1.022 08/21/2015 1854   PHURINE 5.5 08/21/2015 1854   GLUCOSEU NEGATIVE 08/21/2015 1854   HGBUR NEGATIVE 08/21/2015 1854   BILIRUBINUR NEGATIVE 08/21/2015 1854   KETONESUR NEGATIVE 08/21/2015 1854   PROTEINUR NEGATIVE 08/21/2015 1854   UROBILINOGEN 1.0 09/16/2013 2243   NITRITE NEGATIVE 08/21/2015 1854   LEUKOCYTESUR NEGATIVE 08/21/2015 1854     Rutilio Yellowhair M.D. [facility] 08/28/2015, 11:39 AM  Pager: [phone] Between 7am to 7pm - call Pager - [phone]  After 7pm go to [email or web address] - password [name]  Call night coverage person covering after 7pm

## 2015-08-28 NOTE — Progress Notes (Signed)
Patient information reviewed and entered into eRehab system by Makela Niehoff, RN, CRRN, PPS Coordinator.  Information including medical coding and functional independence measure will be reviewed and updated through discharge.     Per nursing patient was given "Data Collection Information Summary for Patients in Inpatient Rehabilitation Facilities with attached "Privacy Act Statement-Health Care Records" upon admission.  

## 2015-08-28 NOTE — NC FL2 (Signed)
[place] LEVEL OF CARE SCREENING TOOL     IDENTIFICATION  Patient Name: Craig Bentley Birthdate: 1955-07-12 Sex: male Admission Date (Current Location): 08/21/2015  [facility] and [place] Number:  Herbalist and Address:  [facility] [place], [place] [address], [place], [place] [address]      Provider Number: [id number]  Attending Physician Name and Address:  Mendel Corning, MD  Relative Name and Phone Number:       Current Level of Care: Hospital Recommended Level of Care: [place] Prior Approval Number:    Date Approved/Denied:   PASRR Number:    Discharge Plan: SNF    Current Diagnoses: Patient Active Problem List   Diagnosis Date Noted  . 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[place]) 08/23/2015  . Sepsis ([place]) 08/21/2015  . ARF (acute renal failure) ([place]) 08/21/2015  . Acute encephalopathy 08/21/2015  . Post-operative state   . Respiratory failure ([place])   . Surgery, elective   . Anxiety   . Schizophrenia ([place])   . Atherosclerosis of extremity with gangrene ([place])   . Hyperglycemia   . Preoperative cardiovascular examination 04/11/2015  . PVD (peripheral vascular disease) ([place]) 04/10/2015  . Absolute anemia   . Diabetic foot infection ([place]) 04/02/2015  . Right foot pain 04/02/2015  . Neutrophilic leukocytosis 81/19/1478  . Esophageal abnormality   . HCAP (healthcare-associated pneumonia)   . COPD exacerbation ([place])   . Dyspnea   . Respiratory distress   . Sepsis due to pneumonia ([place]) 02/14/2015  . Acute respiratory failure with hypoxia ([place]) 02/13/2015  . Diabetes mellitus with neurologic complication, without long-term current use of insulin ([place]) 07/20/2011  . Essential hypertension   . COPD (chronic obstructive pulmonary disease) (H[facility])     Orientation RESPIRATION BLADDER Height & Weight     Self, Time, Situation, Place  Normal Continent Weight: 69.6 kg (153 lb 7 oz) Height:  5' 11.5" (181.6 cm)  BEHAVIORAL  SYMPTOMS/MOOD NEUROLOGICAL BOWEL NUTRITION STATUS      Continent Diet (Please see DC Summary)  AMBULATORY STATUS COMMUNICATION OF NEEDS Skin   Extensive Assist Verbally Surgical wounds (Closed incision on leg)                       Personal Care Assistance Level of Assistance  Bathing, Feeding, Dressing Bathing Assistance: Maximum assistance Feeding assistance: Independent Dressing Assistance: Limited assistance     Functional Limitations Info             SPECIAL CARE FACTORS FREQUENCY  PT (By licensed PT)     PT Frequency: min 3x/week              Contractures Contractures Info: Not present    Additional Factors Info  Code Status, Allergies, Isolation Precautions, Psychotropic, Insulin Sliding Scale Code Status Info: Full Allergies Info: NKA Psychotropic Info: Haldol Insulin Sliding Scale Info: insulin aspart (novoLOG) injection 0-9 Units Isolation Precautions Info: MRSA     Current Medications (08/28/2015):  This is the current hospital active medication list Current Facility-Administered Medications  Medication Dose Route Frequency Provider Last Rate Last Dose  . 0.9 %  sodium chloride infusion   Intravenous Continuous Newt Minion, MD      . acetaminophen (TYLENOL) tablet 650 mg  650 mg Oral Q6H PRN Rise Patience, MD   650 mg at 08/26/15 1831   Or  . acetaminophen (TYLENOL) suppository 650 mg  650 mg Rectal Q6H PRN Rise Patience, MD      .  acetaminophen (TYLENOL) tablet 650 mg  650 mg Oral Q6H PRN Newt Minion, MD       Or  . acetaminophen (TYLENOL) suppository 650 mg  650 mg Rectal Q6H PRN Newt Minion, MD      . Chlorhexidine Gluconate Cloth 2 % PADS 6 each  6 each Topical Q0600 Annita Brod, MD   6 each at 08/27/15 0600  . cloZAPine (CLOZARIL) tablet 100 mg  100 mg Oral 2 times per day Rise Patience, MD   100 mg at 08/28/15 0847  . cloZAPine (CLOZARIL) tablet 400 mg  400 mg Oral QHS Rise Patience, MD   400 mg at 08/27/15  2138  . dicyclomine (BENTYL) tablet 20 mg  20 mg Oral BID PRN Rise Patience, MD      . donepezil (ARICEPT) tablet 5 mg  5 mg Oral QHS Rise Patience, MD   5 mg at 08/27/15 2138  . gabapentin (NEURONTIN) capsule 300 mg  300 mg Oral TID Karmen Bongo, MD   300 mg at 08/28/15 0846  . guaiFENesin (ROBITUSSIN) 100 MG/5ML solution 200 mg  10 mL Oral Q6H PRN Rise Patience, MD      . haloperidol (HALDOL) tablet 5 mg  5 mg Oral QHS Rise Patience, MD   5 mg at 08/27/15 2139  . heparin injection 5,000 Units  5,000 Units Subcutaneous Q8H Ripudeep K Rai, MD      . hydrALAZINE (APRESOLINE) injection 10 mg  10 mg Intravenous Q4H PRN Rise Patience, MD      . HYDROmorphone (DILAUDID) injection 1 mg  1 mg Intravenous Q2H PRN Newt Minion, MD      . insulin aspart (novoLOG) injection 0-9 Units  0-9 Units Subcutaneous TID WC Rise Patience, MD   2 Units at 08/28/15 [phone]  . ipratropium-albuterol (DUONEB) 0.5-2.5 (3) MG/3ML nebulizer solution 3 mL  3 mL Nebulization Q4H PRN Karmen Bongo, MD      . lactated ringers infusion   Intravenous Continuous Oleta Mouse, MD   Stopped at 08/27/15 1217  . lactulose (CHRONULAC) 10 GM/15ML solution 30 g  30 g Oral BID Rise Patience, MD   30 g at 08/28/15 0846  . methocarbamol (ROBAXIN) tablet 500 mg  500 mg Oral Q6H PRN Newt Minion, MD       Or  . methocarbamol (ROBAXIN) 500 mg in dextrose 5 % 50 mL IVPB  500 mg Intravenous Q6H PRN Newt Minion, MD      . metoCLOPramide (REGLAN) tablet 5-10 mg  5-10 mg Oral Q8H PRN Newt Minion, MD       Or  . metoCLOPramide (REGLAN) injection 5-10 mg  5-10 mg Intravenous Q8H PRN Newt Minion, MD      . metoprolol tartrate (LOPRESSOR) tablet 25 mg  25 mg Oral BID Rise Patience, MD   25 mg at 08/28/15 0846  . mupirocin ointment (BACTROBAN) 2 % 1 application  1 application Nasal BID Annita Brod, MD   1 application at 62/95/28 [phone]  . ondansetron (ZOFRAN) tablet 4 mg  4 mg Oral Q6H PRN  Rise Patience, MD       Or  . ondansetron [facility]) injection 4 mg  4 mg Intravenous Q6H PRN Rise Patience, MD      . ondansetron [facility]) tablet 4 mg  4 mg Oral Q6H PRN Newt Minion, MD       Or  .  ondansetron (ZOFRAN) injection 4 mg  4 mg Intravenous Q6H PRN Newt Minion, MD      . oxyCODONE (Oxy IR/ROXICODONE) immediate release tablet 5-10 mg  5-10 mg Oral Q3H PRN Newt Minion, MD   5 mg at 08/27/15 2145  . oxyCODONE-acetaminophen (PERCOCET/ROXICET) 5-325 MG per tablet 1 tablet  1 tablet Oral Q6H PRN Gardiner Barefoot, NP   1 tablet at 08/27/15 0529  . pantoprazole (PROTONIX) EC tablet 40 mg  40 mg Oral Daily Rise Patience, MD   40 mg at 08/28/15 0846  . piperacillin-tazobactam (ZOSYN) IVPB 3.375 g  3.375 g Intravenous Q8H Rebecka Apley, RPH   3.375 g at 08/28/15 0846  . simvastatin (ZOCOR) tablet 5 mg  5 mg Oral QHS Rise Patience, MD   5 mg at 08/27/15 2138  . traMADol (ULTRAM) tablet 50 mg  50 mg Oral Q6H PRN Gardiner Barefoot, NP      . trihexyphenidyl (ARTANE) tablet 5 mg  5 mg Oral BID WC Rise Patience, MD   5 mg at 08/28/15 0847  . vancomycin (VANCOCIN) 1,500 mg in sodium chloride 0.9 % 500 mL IVPB  1,500 mg Intravenous Q12H Wynell Balloon, RPH   1,500 mg at 08/28/15 4327     Discharge Medications: Please see discharge summary for a list of discharge medications.  Relevant Imaging Results:  Relevant Lab Results:   Additional Information SSN: [id number]  Benard Halsted, LCSWA

## 2015-08-28 NOTE — H&P (View-Only) (Signed)
Physical Medicine and Rehabilitation Admission H&P    Chief Complaint  Patient presents with  . R-BKA    HPI:  Craig Bentley is a 60 y.o. 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 He was admitted on 08/21/15 with lethargy and hypotension felt to be related to fentanyl--improved with narcan. He was started on IVF and IV antibiotics due to sepsis and acute renal failure. Right foot not felt to be salvageable per Dr. Bridgett Larsson and Dr. Sharol Given. 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 Sharol Given. Therapy evaluations done today and patient felt to be good CIR candidate.     Review of Systems  HENT: Negative for hearing loss.   Eyes: Negative for blurred vision.  Respiratory: Negative for cough and shortness of breath.   Cardiovascular: Negative for chest pain, palpitations and leg swelling.  Gastrointestinal: Negative for heartburn, abdominal pain and diarrhea.  Genitourinary: Negative for urgency.  Musculoskeletal: Negative for myalgias, back pain and joint pain.  Skin: Negative for itching and rash.  Neurological: Positive for weakness. Negative for dizziness, speech change and headaches.  Psychiatric/Behavioral: Negative for depression. The patient is not nervous/anxious.       Past Medical History  Diagnosis Date  . Hypertension   . Diabetes mellitus   . Schizophrenia, schizo-affective ([place])   . COPD (chronic obstructive pulmonary disease) ([place])   . Constipation   . Fecal impaction ([place])   . Pneumonia   . Anxiety     Past Surgical History  Procedure Laterality Date  . Peripheral vascular catheterization N/A 04/07/2015    Procedure: Abdominal Aortogram;  Surgeon: Angelia Mould, MD;  Location: [place] [facility];  Service: Cardiovascular;  Laterality: N/A;  . Aorta - bilateral  femoral artery bypass graft Bilateral 04/23/2015    Procedure: AORTOBIFEMORAL BYPASS GRAFT;  Surgeon: Conrad Arbuckle, MD;  Location: [place];  Service: Vascular;  Laterality: Bilateral;  . Femoral-tibial bypass graft Right 04/23/2015    Procedure: Right Common Femoral to Posterior Tibial Bypass with Composite Vein Graft. ;  Surgeon: Conrad Seymour, MD;  Location: [place];  Service: Vascular;  Laterality: Right;  . Amputation Right 04/23/2015    Procedure: Right Fifth Toe Amputation;  Surgeon: Conrad Clever, MD;  Location: [place];  Service: Vascular;  Laterality: Right;  . Femoral-femoral bypass graft Left 04/23/2015    Procedure:  Left Common Femoral Artery to Superficial Femoral Artery Bypass Graft with Vein;  Surgeon: Conrad [place], MD;  Location: [place];  Service: Vascular;  Laterality: Left;  . Amputation Right 08/27/2015    Procedure: RIGHT BELOW KNEE AMPUTATION ;  Surgeon: Newt Minion, MD;  Location: [place];  Service: Orthopedics;  Laterality: Right;    Family History  Problem Relation Age of Onset  . Mental illness Mother     Social History:  Disabled. Independent with cane PTA. He reports that he has been smoking Cigarettes--1 PPD.  He has a 75 pack-year smoking history. He has never used smokeless tobacco. He reports that he does not drink alcohol or use illicit drugs.    Allergies: No Known Allergies    Medications Prior to Admission  Medication Sig Dispense Refill  . acetaminophen (TYLENOL) 325 MG tablet Take 650 mg by mouth every 6 (six) hours as needed for mild pain, fever  or headache.     . albuterol (PROAIR HFA) 108 (90 Base) MCG/ACT inhaler Inhale 2 puffs into the lungs every 6 (six) hours as needed for shortness of breath (cough).    Marland Kitchen albuterol (PROVENTIL) (2.5 MG/3ML) 0.083% nebulizer solution Take 3 mLs (2.5 mg total) by nebulization every 4 (four) hours as needed for wheezing. 75 mL 12  . dicyclomine (BENTYL) 20 MG tablet Take 20 mg by mouth 2 (two) times daily as needed for spasms.    Marland Kitchen  donepezil (ARICEPT) 5 MG tablet Take 5 mg by mouth at bedtime.    . gabapentin (NEURONTIN) 300 MG capsule Take 300 mg by mouth 3 (three) times daily.    Marland Kitchen guaifenesin (ROBITUSSIN) 100 MG/5ML syrup Take 200 mg by mouth 4 (four) times daily as needed for cough.    . hydrochlorothiazide (HYDRODIURIL) 25 MG tablet Take 25 mg by mouth daily.    Marland Kitchen ibuprofen (ADVIL,MOTRIN) 200 MG tablet Take 400 mg by mouth 2 (two) times daily as needed (pain).     Marland Kitchen ipratropium-albuterol (DUONEB) 0.5-2.5 (3) MG/3ML SOLN Take 3 mLs by nebulization 3 (three) times daily. 360 mL 1  . lactulose (CHRONULAC) 10 GM/15ML solution Take 30 g by mouth 2 (two) times daily.    Marland Kitchen lisinopril (PRINIVIL,ZESTRIL) 20 MG tablet Take 20 mg by mouth daily.    . metFORMIN (GLUCOPHAGE) 500 MG tablet Take 500 mg by mouth 2 (two) times daily with a meal.    . metoprolol tartrate (LOPRESSOR) 25 MG tablet Take 25 mg by mouth 2 (two) times daily.    . Multiple Vitamin (MULITIVITAMIN WITH MINERALS) TABS Take 1 tablet by mouth daily.    . pantoprazole (PROTONIX) 40 MG tablet Take 1 tablet (40 mg total) by mouth daily. 30 tablet 1  . polyethylene glycol (MIRALAX / GLYCOLAX) packet Take 17 g by mouth daily as needed (constipation). Mix in 8 oz liquid and drink    . senna (SENOKOT) 8.6 MG TABS Take 1 tablet by mouth 2 (two) times daily.    Marland Kitchen senna-docusate (SENOKOT-S) 8.6-50 MG tablet Take 1 tablet by mouth at bedtime as needed for mild constipation. 30 tablet 1  . simvastatin (ZOCOR) 5 MG tablet Take 5 mg by mouth at bedtime.    . [EXPIRED] sulfamethoxazole-trimethoprim (BACTRIM DS,SEPTRA DS) 800-160 MG tablet Take 1 tablet by mouth 2 (two) times daily. 14 tablet 0  . [DISCONTINUED] cloZAPine (CLOZARIL) 100 MG tablet Take 100-400 mg by mouth 3 (three) times daily. Take 1 tablet (100 mg) by mouth daily at 8am and 4pm, take 4 tablets (400 mg) at bedtime    . [DISCONTINUED] doxycycline (VIBRA-TABS) 100 MG tablet Take 100 mg by mouth 2 (two) times daily.      . [DISCONTINUED] haloperidol (HALDOL) 5 MG tablet Take 5 mg by mouth at bedtime.    . [DISCONTINUED] trihexyphenidyl (ARTANE) 5 MG tablet Take 5 mg by mouth 2 (two) times daily with a meal.    . aspirin EC 81 MG tablet Take 1 tablet (81 mg total) by mouth daily. (Patient not taking: Reported on 08/20/2015) 30 tablet 0  . fentaNYL (DURAGESIC) 50 MCG/HR Place 1 patch (50 mcg total) onto the skin every 3 (three) days. (Patient not taking: Reported on 08/20/2015) 5 patch 0  . [DISCONTINUED] oxyCODONE-acetaminophen (PERCOCET/ROXICET) 5-325 MG tablet Take 1 tablet by mouth every 4 (four) hours as needed for moderate pain. (Patient not taking: Reported on 08/20/2015) 30 tablet 0    Home: Home Living Family/patient expects  to be discharged to:: Assisted living Home Equipment: Walker - 4 wheels, Shower seat Additional Comments: ramp to enter   Functional History: Prior Function Level of Independence: Needs assistance Gait / Transfers Assistance Needed: uses a walker with a seat ADL's / Homemaking Assistance Needed: reports he can dress himself and has assistance for showering  Functional Status:  Mobility: Bed Mobility Overal bed mobility: Needs Assistance Bed Mobility: Supine to Sit, Sit to Supine Supine to sit: Min assist Sit to supine: Min guard General bed mobility comments: stability assist and cues for technique Transfers Overall transfer level: Needs assistance Equipment used: Rolling walker (2 wheeled) Transfers: Sit to/from Stand Sit to Stand: Min assist General transfer comment: stability assist and cues for safe hand placement Ambulation/Gait General Gait Details: pt deferred    ADL:    Cognition: Cognition Overall Cognitive Status: Within Functional Limits for tasks assessed Orientation Level: Oriented X4 Cognition Arousal/Alertness: Awake/alert, Lethargic Behavior During Therapy: WFL for tasks assessed/performed Overall Cognitive Status: Within Functional Limits for  tasks assessed   Blood pressure 118/82, pulse 92, temperature 99.4 F (37.4 C), temperature source Oral, resp. rate 16, height 5' 11.5" (1.816 m), weight 69.6 kg (153 lb 7 oz), SpO2 93 %. Physical Exam  Nursing note and vitals reviewed. Constitutional: He is oriented to person, place, and time. He appears well-developed and well-nourished.  HENT:  Head: Normocephalic and atraumatic.  Mouth/Throat: Oropharynx is clear and moist.  Eyes: Conjunctivae are normal. Pupils are equal, round, and reactive to light. No scleral icterus.  Neck: Normal range of motion. Neck supple.  Cardiovascular: Normal rate and regular rhythm.   Respiratory: Effort normal and breath sounds normal. No stridor. He exhibits no tenderness.  GI: Soft. Bowel sounds are normal. He exhibits no distension. There is no tenderness.  Musculoskeletal: He exhibits no edema.  R-BKA with VAC in place.   Neurological: He is alert and oriented to person, place, and time.  Oral dyskinesias noted. Able to follow basic commands without difficulty. UE 4+ to 5/5. RLE 2+ to 3/5 HF, LLW: 3/5 HF, 4/5 KE and ADF/PF  Skin: Skin is warm and dry.  Psychiatric: He has a normal mood and affect. His behavior is normal.    Results for orders placed or performed during the hospital encounter of 08/21/15 (from the past 48 hour(s))  Glucose, capillary     Status: Abnormal   Collection Time: 08/26/15  5:48 PM  Result Value Ref Range   Glucose-Capillary 134 (H) 65 - 99 mg/dL  Vancomycin, trough     Status: None   Collection Time: 08/26/15  6:13 PM  Result Value Ref Range   Vancomycin Tr 15 15 - 20 ug/mL  Glucose, capillary     Status: Abnormal   Collection Time: 08/26/15  9:41 PM  Result Value Ref Range   Glucose-Capillary 144 (H) 65 - 99 mg/dL  Basic metabolic panel     Status: Abnormal   Collection Time: 08/27/15  6:05 AM  Result Value Ref Range   Sodium 140 135 - 145 mmol/L   Potassium 3.8 3.5 - 5.1 mmol/L   Chloride 105 101 - 111  mmol/L   CO2 28 22 - 32 mmol/L   Glucose, Bld 115 (H) 65 - 99 mg/dL   BUN 10 6 - 20 mg/dL   Creatinine, Ser 0.71 0.61 - 1.24 mg/dL   Calcium 9.0 8.9 - 10.3 mg/dL   GFR calc non Af Amer >60 >60 mL/min   GFR calc Af Amer >60 >  60 mL/min    Comment: (NOTE) The eGFR has been calculated using the CKD EPI equation. This calculation has not been validated in all clinical situations. eGFR's persistently <60 mL/min signify possible Chronic Kidney Disease.    Anion gap 7 5 - 15  Glucose, capillary     Status: None   Collection Time: 08/27/15 10:24 AM  Result Value Ref Range   Glucose-Capillary 94 65 - 99 mg/dL  Glucose, capillary     Status: Abnormal   Collection Time: 08/27/15 12:28 PM  Result Value Ref Range   Glucose-Capillary 157 (H) 65 - 99 mg/dL  Glucose, capillary     Status: Abnormal   Collection Time: 08/27/15  5:15 PM  Result Value Ref Range   Glucose-Capillary 124 (H) 65 - 99 mg/dL  Glucose, capillary     Status: Abnormal   Collection Time: 08/27/15  9:04 PM  Result Value Ref Range   Glucose-Capillary 137 (H) 65 - 99 mg/dL  Glucose, capillary     Status: Abnormal   Collection Time: 08/28/15  7:32 AM  Result Value Ref Range   Glucose-Capillary 195 (H) 65 - 99 mg/dL  Basic metabolic panel     Status: Abnormal   Collection Time: 08/28/15 10:37 AM  Result Value Ref Range   Sodium 138 135 - 145 mmol/L   Potassium 4.0 3.5 - 5.1 mmol/L   Chloride 104 101 - 111 mmol/L   CO2 26 22 - 32 mmol/L   Glucose, Bld 153 (H) 65 - 99 mg/dL   BUN 9 6 - 20 mg/dL   Creatinine, Ser 0.61 0.61 - 1.24 mg/dL   Calcium 9.1 8.9 - 10.3 mg/dL   GFR calc non Af Amer >60 >60 mL/min   GFR calc Af Amer >60 >60 mL/min    Comment: (NOTE) The eGFR has been calculated using the CKD EPI equation. This calculation has not been validated in all clinical situations. eGFR's persistently <60 mL/min signify possible Chronic Kidney Disease.    Anion gap 8 5 - 15  CBC with Differential/Platelet     Status:  Abnormal   Collection Time: 08/28/15 10:37 AM  Result Value Ref Range   WBC 13.2 (H) 4.0 - 10.5 K/uL   RBC 3.70 (L) 4.22 - 5.81 MIL/uL   Hemoglobin 9.6 (L) 13.0 - 17.0 g/dL   HCT 31.7 (L) 39.0 - 52.0 %   MCV 85.7 78.0 - 100.0 fL   MCH 25.9 (L) 26.0 - 34.0 pg   MCHC 30.3 30.0 - 36.0 g/dL   RDW 14.7 11.5 - 15.5 %   Platelets 360 150 - 400 K/uL   Neutrophils Relative % 81 %   Neutro Abs 10.7 (H) 1.7 - 7.7 K/uL   Lymphocytes Relative 12 %   Lymphs Abs 1.6 0.7 - 4.0 K/uL   Monocytes Relative 7 %   Monocytes Absolute 0.9 0.1 - 1.0 K/uL   Eosinophils Relative 0 %   Eosinophils Absolute 0.0 0.0 - 0.7 K/uL   Basophils Relative 0 %   Basophils Absolute 0.0 0.0 - 0.1 K/uL  CBC     Status: Abnormal   Collection Time: 08/28/15 10:37 AM  Result Value Ref Range   WBC 13.9 (H) 4.0 - 10.5 K/uL   RBC 3.67 (L) 4.22 - 5.81 MIL/uL   Hemoglobin 9.6 (L) 13.0 - 17.0 g/dL   HCT 31.5 (L) 39.0 - 52.0 %   MCV 85.8 78.0 - 100.0 fL   MCH 26.2 26.0 - 34.0 pg   MCHC  30.5 30.0 - 36.0 g/dL   RDW 14.6 11.5 - 15.5 %   Platelets 383 150 - 400 K/uL  Glucose, capillary     Status: Abnormal   Collection Time: 08/28/15 11:55 AM  Result Value Ref Range   Glucose-Capillary 154 (H) 65 - 99 mg/dL   No results found.     Medical Problem List and Plan: 1.  Functional and mobility deficits secondary to PAD, s/p right BKA  -begin CIR therapies 2.  DVT Prophylaxis/Anticoagulation: lovenox 36m sq daily 3. Pain Management: pt reports good pain control at present.   -tramadol/oxycodone [id number] prn.   -robaxin [id number] muscle spasms.   -gabapentin for peripheral neuropathic pain/pt denies phantom limb pain at present  4. Mood: pt in good spirits. Mood appears stable   -haldol, aricept, clozapine 5. Neuropsych: This patient is capable of making decisions on his own behalf. 6. Skin/Wound Care: vac to wound approx 7 days, shrinker 7. Fluids/Electrolytes/Nutrition: encourage po, check admission labs 8. Acute Renal  Failure: Cr down to 0.61 after being septic  -check labs in AM 9. 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 Essential HTN--metoprolol 59m1md 11. DM2---diet controlled. cbg's have been generally in 100's.    -check cbg's AC and HS. SSI as appropriate 12. Acute encephalopathy--resolved    Post Admission Physician Evaluation: 1. Functional deficits secondary  to right BKA. 2. Patient is admitted to receive collaborative, interdisciplinary care between the physiatrist, rehab nursing staff, and therapy team. 3. Patient's level of medical complexity and substantial therapy needs in context of that medical necessity cannot be provided at a lesser intensity of care such as a SNF. 4. Patient has experienced substantial functional loss from his/her baseline which was documented above under the "Functional History" and "Functional Status" headings.  Judging by the patient's diagnosis, physical exam, and functional history, the patient has potential for functional progress which will result in measurable gains while on inpatient rehab.  These gains will be of substantial and practical use upon discharge  in facilitating mobility and self-care at the household level. 5. Physiatrist will provide 24 hour management of medical needs as well as oversight of the therapy plan/treatment and provide guidance as appropriate regarding the interaction of the two. 6. 24 hour rehab nursing will assist with bladder management, bowel management, safety, skin/wound care, disease management, medication administration, pain management and patient education  and help integrate therapy concepts, techniques,education, etc. 7. PT will assess and treat for/with: Lower extremity strength, range of motion, stamina, balance, functional mobility, safety, adaptive techniques and equipment, pain mgt, pre-prosthetic education.   Goals are: mod I at w/c level. 8. OT will assess and treat for/with: ADL's, functional mobility, safety, upper extremity strength,  adaptive techniques and equipment, pre-prosthetic education, pain mgt, ego support, community reintegration.   Goals are: mod I to supervision/min assist. Therapy may not yet proceed with showering this patient. 9. SLP will assess and treat for/with: n/a.  Goals are: n/a. 10. Case Management and Social Worker will assess and treat for psychological issues and discharge planning. 11. Team conference will be held weekly to assess progress toward goals and to determine barriers to discharge. 12. Patient will receive at least 3 hours of therapy per day at least 5 days per week. 13. ELOS: 11-15 days       14. Prognosis:  excellent    ZachMeredith Staggers, FAAPDelmarsical Medicine & Rehabilitation 08/28/2015   08/28/2015

## 2015-08-28 NOTE — Progress Notes (Signed)
Pt admitted to unit at 1545. RN reviewed rehab booklet, schedule and safety plan with pt. Pt verbally understands protocol. Cane from home at bedside. Wound VAC in place no drainage present. Call bell with in reach. SRx3.

## 2015-08-28 NOTE — Care Management Note (Signed)
Case Management Note  Patient Details  Name: Craig Bentley MRN: [id number] Date of Birth: 1955/09/29  Subjective/Objective:                 Patient admitted from [facility]. Waiting PASSR level2, PT note, CBC prior to DC.   Action/Plan:  DC to SNF as facilitated by CSW today.   Expected Discharge Date:                  Expected Discharge Plan:  Skilled Nursing Facility  In-House Referral:  Clinical Social Work  Discharge planning Services  CM Consult  Post Acute Care Choice:    Choice offered to:     DME Arranged:    DME Agency:     HH Arranged:    [place] Agency:     Status of Service:  Completed, signed off  If discussed at [name], dates discussed:    Additional Comments:  Carles Collet, RN 08/28/2015, 1:04 PM

## 2015-08-28 NOTE — Interval H&P Note (Signed)
Craig Bentley was admitted today to Inpatient Rehabilitation with the diagnosis of right BKA.  The patient's history has been reviewed, patient examined, and there is no change in status.  Patient continues to be appropriate for intensive inpatient rehabilitation.  I have reviewed the patient's chart and labs.  Questions were answered to the patient's satisfaction. The PAPE has been reviewed and assessment remains appropriate.  SWARTZ,ZACHARY T 08/28/2015, 4:20 PM

## 2015-08-28 NOTE — H&P (Signed)
Physical Medicine and Rehabilitation Admission H&P    Chief Complaint  Patient presents with  . R-BKA    HPI:  Craig Bentley is a 60 y.o. 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 He was admitted on 08/21/15 with lethargy and hypotension felt to be related to fentanyl--improved with narcan. He was started on IVF and IV antibiotics due to sepsis and acute renal failure. Right foot not felt to be salvageable per Dr. Bridgett Larsson and Dr. Sharol Given. 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 Sharol Given. Therapy evaluations done today and patient felt to be good CIR candidate.     Review of Systems  HENT: Negative for hearing loss.   Eyes: Negative for blurred vision.  Respiratory: Negative for cough and shortness of breath.   Cardiovascular: Negative for chest pain, palpitations and leg swelling.  Gastrointestinal: Negative for heartburn, abdominal pain and diarrhea.  Genitourinary: Negative for urgency.  Musculoskeletal: Negative for myalgias, back pain and joint pain.  Skin: Negative for itching and rash.  Neurological: Positive for weakness. Negative for dizziness, speech change and headaches.  Psychiatric/Behavioral: Negative for depression. The patient is not nervous/anxious.       Past Medical History  Diagnosis Date  . Hypertension   . Diabetes mellitus   . Schizophrenia, schizo-affective ([place])   . COPD (chronic obstructive pulmonary disease) ([place])   . Constipation   . Fecal impaction ([place])   . Pneumonia   . Anxiety     Past Surgical History  Procedure Laterality Date  . Peripheral vascular catheterization N/A 04/07/2015    Procedure: Abdominal Aortogram;  Surgeon: Angelia Mould, MD;  Location: [facility];  Service: Cardiovascular;  Laterality: N/A;  . Aorta - bilateral  femoral artery bypass graft Bilateral 04/23/2015    Procedure: AORTOBIFEMORAL BYPASS GRAFT;  Surgeon: Conrad [place], MD;  Location: [facility];  Service: Vascular;  Laterality: Bilateral;  . Femoral-tibial bypass graft Right 04/23/2015    Procedure: Right Common Femoral to Posterior Tibial Bypass with Composite Vein Graft. ;  Surgeon: Conrad Clifton, MD;  Location: [place];  Service: Vascular;  Laterality: Right;  . Amputation Right 04/23/2015    Procedure: Right Fifth Toe Amputation;  Surgeon: Conrad Bellefonte, MD;  Location: [facility];  Service: Vascular;  Laterality: Right;  . Femoral-femoral bypass graft Left 04/23/2015    Procedure:  Left Common Femoral Artery to Superficial Femoral Artery Bypass Graft with Vein;  Surgeon: Conrad [place], MD;  Location: [place];  Service: Vascular;  Laterality: Left;  . Amputation Right 08/27/2015    Procedure: RIGHT BELOW KNEE AMPUTATION ;  Surgeon: Newt Minion, MD;  Location: [facility];  Service: Orthopedics;  Laterality: Right;    Family History  Problem Relation Age of Onset  . Mental illness Mother     Social History:  Disabled. Independent with cane PTA. He reports that he has been smoking Cigarettes--1 PPD.  He has a 75 pack-year smoking history. He has never used smokeless tobacco. He reports that he does not drink alcohol or use illicit drugs.    Allergies: No Known Allergies    Medications Prior to Admission  Medication Sig Dispense Refill  . acetaminophen (TYLENOL) 325 MG tablet Take 650 mg by mouth every 6 (six) hours as needed for mild pain, fever  or headache.     . albuterol (PROAIR HFA) 108 (90 Base) MCG/ACT inhaler Inhale 2 puffs into the lungs every 6 (six) hours as needed for shortness of breath (cough).    Marland Kitchen albuterol (PROVENTIL) (2.5 MG/3ML) 0.083% nebulizer solution Take 3 mLs (2.5 mg total) by nebulization every 4 (four) hours as needed for wheezing. 75 mL 12  . dicyclomine (BENTYL) 20 MG tablet Take 20 mg by mouth 2 (two) times daily as needed for spasms.    Marland Kitchen  donepezil (ARICEPT) 5 MG tablet Take 5 mg by mouth at bedtime.    . gabapentin (NEURONTIN) 300 MG capsule Take 300 mg by mouth 3 (three) times daily.    Marland Kitchen guaifenesin (ROBITUSSIN) 100 MG/5ML syrup Take 200 mg by mouth 4 (four) times daily as needed for cough.    . hydrochlorothiazide (HYDRODIURIL) 25 MG tablet Take 25 mg by mouth daily.    Marland Kitchen ibuprofen (ADVIL,MOTRIN) 200 MG tablet Take 400 mg by mouth 2 (two) times daily as needed (pain).     Marland Kitchen ipratropium-albuterol (DUONEB) 0.5-2.5 (3) MG/3ML SOLN Take 3 mLs by nebulization 3 (three) times daily. 360 mL 1  . lactulose (CHRONULAC) 10 GM/15ML solution Take 30 g by mouth 2 (two) times daily.    Marland Kitchen lisinopril (PRINIVIL,ZESTRIL) 20 MG tablet Take 20 mg by mouth daily.    . metFORMIN (GLUCOPHAGE) 500 MG tablet Take 500 mg by mouth 2 (two) times daily with a meal.    . metoprolol tartrate (LOPRESSOR) 25 MG tablet Take 25 mg by mouth 2 (two) times daily.    . Multiple Vitamin (MULITIVITAMIN WITH MINERALS) TABS Take 1 tablet by mouth daily.    . pantoprazole (PROTONIX) 40 MG tablet Take 1 tablet (40 mg total) by mouth daily. 30 tablet 1  . polyethylene glycol (MIRALAX / GLYCOLAX) packet Take 17 g by mouth daily as needed (constipation). Mix in 8 oz liquid and drink    . senna (SENOKOT) 8.6 MG TABS Take 1 tablet by mouth 2 (two) times daily.    Marland Kitchen senna-docusate (SENOKOT-S) 8.6-50 MG tablet Take 1 tablet by mouth at bedtime as needed for mild constipation. 30 tablet 1  . simvastatin (ZOCOR) 5 MG tablet Take 5 mg by mouth at bedtime.    . [EXPIRED] sulfamethoxazole-trimethoprim (BACTRIM DS,SEPTRA DS) 800-160 MG tablet Take 1 tablet by mouth 2 (two) times daily. 14 tablet 0  . [DISCONTINUED] cloZAPine (CLOZARIL) 100 MG tablet Take 100-400 mg by mouth 3 (three) times daily. Take 1 tablet (100 mg) by mouth daily at 8am and 4pm, take 4 tablets (400 mg) at bedtime    . [DISCONTINUED] doxycycline (VIBRA-TABS) 100 MG tablet Take 100 mg by mouth 2 (two) times daily.      . [DISCONTINUED] haloperidol (HALDOL) 5 MG tablet Take 5 mg by mouth at bedtime.    . [DISCONTINUED] trihexyphenidyl (ARTANE) 5 MG tablet Take 5 mg by mouth 2 (two) times daily with a meal.    . aspirin EC 81 MG tablet Take 1 tablet (81 mg total) by mouth daily. (Patient not taking: Reported on 08/20/2015) 30 tablet 0  . fentaNYL (DURAGESIC) 50 MCG/HR Place 1 patch (50 mcg total) onto the skin every 3 (three) days. (Patient not taking: Reported on 08/20/2015) 5 patch 0  . [DISCONTINUED] oxyCODONE-acetaminophen (PERCOCET/ROXICET) 5-325 MG tablet Take 1 tablet by mouth every 4 (four) hours as needed for moderate pain. (Patient not taking: Reported on 08/20/2015) 30 tablet 0    Home: Home Living Family/patient expects  to be discharged to:: Assisted living Home Equipment: Walker - 4 wheels, Shower seat Additional Comments: ramp to enter   Functional History: Prior Function Level of Independence: Needs assistance Gait / Transfers Assistance Needed: uses a walker with a seat ADL's / Homemaking Assistance Needed: reports he can dress himself and has assistance for showering  Functional Status:  Mobility: Bed Mobility Overal bed mobility: Needs Assistance Bed Mobility: Supine to Sit, Sit to Supine Supine to sit: Min assist Sit to supine: Min guard General bed mobility comments: stability assist and cues for technique Transfers Overall transfer level: Needs assistance Equipment used: Rolling walker (2 wheeled) Transfers: Sit to/from Stand Sit to Stand: Min assist General transfer comment: stability assist and cues for safe hand placement Ambulation/Gait General Gait Details: pt deferred    ADL:    Cognition: Cognition Overall Cognitive Status: Within Functional Limits for tasks assessed Orientation Level: Oriented X4 Cognition Arousal/Alertness: Awake/alert, Lethargic Behavior During Therapy: WFL for tasks assessed/performed Overall Cognitive Status: Within Functional Limits for  tasks assessed   Blood pressure 118/82, pulse 92, temperature 99.4 F (37.4 C), temperature source Oral, resp. rate 16, height 5' 11.5" (1.816 m), weight 69.6 kg (153 lb 7 oz), SpO2 93 %. Physical Exam  Nursing note and vitals reviewed. Constitutional: He is oriented to person, place, and time. He appears well-developed and well-nourished.  HENT:  Head: Normocephalic and atraumatic.  Mouth/Throat: Oropharynx is clear and moist.  Eyes: Conjunctivae are normal. Pupils are equal, round, and reactive to light. No scleral icterus.  Neck: Normal range of motion. Neck supple.  Cardiovascular: Normal rate and regular rhythm.   Respiratory: Effort normal and breath sounds normal. No stridor. He exhibits no tenderness.  GI: Soft. Bowel sounds are normal. He exhibits no distension. There is no tenderness.  Musculoskeletal: He exhibits no edema.  R-BKA with VAC in place.   Neurological: He is alert and oriented to person, place, and time.  Oral dyskinesias noted. Able to follow basic commands without difficulty. UE 4+ to 5/5. RLE 2+ to 3/5 HF, LLW: 3/5 HF, 4/5 KE and ADF/PF  Skin: Skin is warm and dry.  Psychiatric: He has a normal mood and affect. His behavior is normal.    Results for orders placed or performed during the hospital encounter of 08/21/15 (from the past 48 hour(s))  Glucose, capillary     Status: Abnormal   Collection Time: 08/26/15  5:48 PM  Result Value Ref Range   Glucose-Capillary 134 (H) 65 - 99 mg/dL  Vancomycin, trough     Status: None   Collection Time: 08/26/15  6:13 PM  Result Value Ref Range   Vancomycin Tr 15 15 - 20 ug/mL  Glucose, capillary     Status: Abnormal   Collection Time: 08/26/15  9:41 PM  Result Value Ref Range   Glucose-Capillary 144 (H) 65 - 99 mg/dL  Basic metabolic panel     Status: Abnormal   Collection Time: 08/27/15  6:05 AM  Result Value Ref Range   Sodium 140 135 - 145 mmol/L   Potassium 3.8 3.5 - 5.1 mmol/L   Chloride 105 101 - 111  mmol/L   CO2 28 22 - 32 mmol/L   Glucose, Bld 115 (H) 65 - 99 mg/dL   BUN 10 6 - 20 mg/dL   Creatinine, Ser 0.71 0.61 - 1.24 mg/dL   Calcium 9.0 8.9 - 10.3 mg/dL   GFR calc non Af Amer >60 >60 mL/min   GFR calc Af Amer >60 >  60 mL/min    Comment: (NOTE) The eGFR has been calculated using the CKD EPI equation. This calculation has not been validated in all clinical situations. eGFR's persistently <60 mL/min signify possible Chronic Kidney Disease.    Anion gap 7 5 - 15  Glucose, capillary     Status: None   Collection Time: 08/27/15 10:24 AM  Result Value Ref Range   Glucose-Capillary 94 65 - 99 mg/dL  Glucose, capillary     Status: Abnormal   Collection Time: 08/27/15 12:28 PM  Result Value Ref Range   Glucose-Capillary 157 (H) 65 - 99 mg/dL  Glucose, capillary     Status: Abnormal   Collection Time: 08/27/15  5:15 PM  Result Value Ref Range   Glucose-Capillary 124 (H) 65 - 99 mg/dL  Glucose, capillary     Status: Abnormal   Collection Time: 08/27/15  9:04 PM  Result Value Ref Range   Glucose-Capillary 137 (H) 65 - 99 mg/dL  Glucose, capillary     Status: Abnormal   Collection Time: 08/28/15  7:32 AM  Result Value Ref Range   Glucose-Capillary 195 (H) 65 - 99 mg/dL  Basic metabolic panel     Status: Abnormal   Collection Time: 08/28/15 10:37 AM  Result Value Ref Range   Sodium 138 135 - 145 mmol/L   Potassium 4.0 3.5 - 5.1 mmol/L   Chloride 104 101 - 111 mmol/L   CO2 26 22 - 32 mmol/L   Glucose, Bld 153 (H) 65 - 99 mg/dL   BUN 9 6 - 20 mg/dL   Creatinine, Ser 0.61 0.61 - 1.24 mg/dL   Calcium 9.1 8.9 - 10.3 mg/dL   GFR calc non Af Amer >60 >60 mL/min   GFR calc Af Amer >60 >60 mL/min    Comment: (NOTE) The eGFR has been calculated using the CKD EPI equation. This calculation has not been validated in all clinical situations. eGFR's persistently <60 mL/min signify possible Chronic Kidney Disease.    Anion gap 8 5 - 15  CBC with Differential/Platelet     Status:  Abnormal   Collection Time: 08/28/15 10:37 AM  Result Value Ref Range   WBC 13.2 (H) 4.0 - 10.5 K/uL   RBC 3.70 (L) 4.22 - 5.81 MIL/uL   Hemoglobin 9.6 (L) 13.0 - 17.0 g/dL   HCT 31.7 (L) 39.0 - 52.0 %   MCV 85.7 78.0 - 100.0 fL   MCH 25.9 (L) 26.0 - 34.0 pg   MCHC 30.3 30.0 - 36.0 g/dL   RDW 14.7 11.5 - 15.5 %   Platelets 360 150 - 400 K/uL   Neutrophils Relative % 81 %   Neutro Abs 10.7 (H) 1.7 - 7.7 K/uL   Lymphocytes Relative 12 %   Lymphs Abs 1.6 0.7 - 4.0 K/uL   Monocytes Relative 7 %   Monocytes Absolute 0.9 0.1 - 1.0 K/uL   Eosinophils Relative 0 %   Eosinophils Absolute 0.0 0.0 - 0.7 K/uL   Basophils Relative 0 %   Basophils Absolute 0.0 0.0 - 0.1 K/uL  CBC     Status: Abnormal   Collection Time: 08/28/15 10:37 AM  Result Value Ref Range   WBC 13.9 (H) 4.0 - 10.5 K/uL   RBC 3.67 (L) 4.22 - 5.81 MIL/uL   Hemoglobin 9.6 (L) 13.0 - 17.0 g/dL   HCT 31.5 (L) 39.0 - 52.0 %   MCV 85.8 78.0 - 100.0 fL   MCH 26.2 26.0 - 34.0 pg   MCHC  30.5 30.0 - 36.0 g/dL   RDW 14.6 11.5 - 15.5 %   Platelets 383 150 - 400 K/uL  Glucose, capillary     Status: Abnormal   Collection Time: 08/28/15 11:55 AM  Result Value Ref Range   Glucose-Capillary 154 (H) 65 - 99 mg/dL   No results found.     Medical Problem List and Plan: 1.  Functional and mobility deficits secondary to PAD, s/p right BKA  -begin CIR therapies 2.  DVT Prophylaxis/Anticoagulation: lovenox 57m sq daily 3. Pain Management: pt reports good pain control at present.   -tramadol/oxycodone [id number] prn.   -robaxin [id number] muscle spasms.   -gabapentin for peripheral neuropathic pain/pt denies phantom limb pain at present  4. Mood: pt in good spirits. Mood appears stable   -haldol, aricept, clozapine 5. Neuropsych: This patient is capable of making decisions on his own behalf. 6. Skin/Wound Care: vac to wound approx 7 days, shrinker 7. Fluids/Electrolytes/Nutrition: encourage po, check admission labs 8. Acute Renal  Failure: Cr down to 0.61 after being septic  -check labs in AM 9. 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 Essential HTN--metoprolol [id number] 11. DM2---diet controlled. cbg's have been generally in 100's.    -check cbg's AC and HS. SSI as appropriate 12. Acute encephalopathy--resolved    Post Admission Physician Evaluation: 1. Functional deficits secondary  to right BKA. 2. Patient is admitted to receive collaborative, interdisciplinary care between the physiatrist, rehab nursing staff, and therapy team. 3. Patient's level of medical complexity and substantial therapy needs in context of that medical necessity cannot be provided at a lesser intensity of care such as a SNF. 4. Patient has experienced substantial functional loss from his/her baseline which was documented above under the "Functional History" and "Functional Status" headings.  Judging by the patient's diagnosis, physical exam, and functional history, the patient has potential for functional progress which will result in measurable gains while on inpatient rehab.  These gains will be of substantial and practical use upon discharge  in facilitating mobility and self-care at the household level. 5. Physiatrist will provide 24 hour management of medical needs as well as oversight of the therapy plan/treatment and provide guidance as appropriate regarding the interaction of the two. 6. 24 hour rehab nursing will assist with bladder management, bowel management, safety, skin/wound care, disease management, medication administration, pain management and patient education  and help integrate therapy concepts, techniques,education, etc. 7. PT will assess and treat for/with: Lower extremity strength, range of motion, stamina, balance, functional mobility, safety, adaptive techniques and equipment, pain mgt, pre-prosthetic education.   Goals are: mod I at w/c level. 8. OT will assess and treat for/with: ADL's, functional mobility, safety, upper extremity strength,  adaptive techniques and equipment, pre-prosthetic education, pain mgt, ego support, community reintegration.   Goals are: mod I to supervision/min assist. Therapy may not yet proceed with showering this patient. 9. SLP will assess and treat for/with: n/a.  Goals are: n/a. 10. Case Management and Social Worker will assess and treat for psychological issues and discharge planning. 11. Team conference will be held weekly to assess progress toward goals and to determine barriers to discharge. 12. Patient will receive at least 3 hours of therapy per day at least 5 days per week. 13. ELOS: 11-15 days       14. Prognosis:  excellent    ZachMeredith Staggers, FAAPLaguna Hillssical Medicine & Rehabilitation 08/28/2015   08/28/2015

## 2015-08-28 NOTE — Progress Notes (Signed)
Meredith Staggers, MD Physician Signed Physical Medicine and Rehabilitation Consult Note 08/28/2015 8:19 AM  Related encounter: ED to Hosp-Admission (Current) from 08/21/2015 in [place] [facility]        Physical Medicine and Rehabilitation Consult   Reason for Consult: R-BKA Referring Physician: Dr. Sharol Given   HPI: Craig Bentley is a 60 y.o. 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 He was admitted on 08/21/15 with lethargy and hypotension felt to be related to fentanyl--improved with narcan. He was started on IVF and IV antibiotics due to sepsis and acute renal failure. Right foot not felt to be salvageable per Dr. Bridgett Larsson and Dr. Sharol Given. 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 Sharol Given. Therapy evaluations to be done today. CIR consulted in anticipation of extensive rehab needs.    Review of Systems  Constitutional: Negative for fever and chills.  HENT: Negative for hearing loss.  Eyes: Negative for blurred vision and double vision.  Respiratory: Positive for cough. Negative for shortness of breath.  Cardiovascular: Negative for chest pain, palpitations and leg swelling.  Gastrointestinal: Negative for heartburn, nausea, abdominal pain and diarrhea.  Genitourinary: Negative for dysuria.  Musculoskeletal: Negative for myalgias, back pain and joint pain.  Skin: Negative for itching and rash.  Neurological: Negative for headaches.  Psychiatric/Behavioral: The patient does not have insomnia.      Past Medical History  Diagnosis Date  . Hypertension   . Diabetes mellitus   . Schizophrenia, schizo-affective ([place])   . COPD (chronic obstructive pulmonary disease) ([place])   . Constipation   . Fecal impaction ([place])   .  Pneumonia   . Anxiety     Past Surgical History  Procedure Laterality Date  . Peripheral vascular catheterization N/A 04/07/2015    Procedure: Abdominal Aortogram; Surgeon: Angelia Mould, MD; Location: [facility]; Service: Cardiovascular; Laterality: N/A;  . Aorta - bilateral femoral artery bypass graft Bilateral 04/23/2015    Procedure: AORTOBIFEMORAL BYPASS GRAFT; Surgeon: Conrad [place], MD; Location: [place]; Service: Vascular; Laterality: Bilateral;  . Femoral-tibial bypass graft Right 04/23/2015    Procedure: Right Common Femoral to Posterior Tibial Bypass with Composite Vein Graft. ; Surgeon: Conrad [place], MD; Location: [place]; Service: Vascular; Laterality: Right;  . Amputation Right 04/23/2015    Procedure: Right Fifth Toe Amputation; Surgeon: Conrad Manele, MD; Location[facility]; Service: Vascular; Laterality: Right;  . Femoral-femoral bypass graft Left 04/23/2015    Procedure: Left Common Femoral Artery to Superficial Femoral Artery Bypass Graft with Vein; Surgeon: Conrad [place], MD; Location: [facility] OR; Service: Vascular; Laterality: Left;    Family History  Problem Relation Age of Onset  . Mental illness Mother     Social History: Lives at [place]. Was independent with cane PTA. Family checks on him frequently. Had [facility] for wound care PTA. He reports that he has been smoking Cigarettes--1 PPD. He has a 75 pack-year smoking history. He has never used smokeless tobacco. He reports that he does not drink alcohol or use illicit drugs.    Allergies: No Known Allergies    Medications Prior to Admission  Medication Sig Dispense Refill  . acetaminophen (TYLENOL) 325 MG tablet Take 650 mg by mouth every 6 (six) hours as needed for mild  pain, fever or headache.     . albuterol (PROAIR HFA) 108 (90 Base) MCG/ACT inhaler Inhale 2 puffs into the lungs every 6 (six) hours as needed for  shortness of breath (cough).    Marland Kitchen albuterol (PROVENTIL) (2.5 MG/3ML) 0.083% nebulizer solution Take 3 mLs (2.5 mg total) by nebulization every 4 (four) hours as needed for wheezing. 75 mL 12  . cloZAPine (CLOZARIL) 100 MG tablet Take 100-400 mg by mouth 3 (three) times daily. Take 1 tablet (100 mg) by mouth daily at 8am and 4pm, take 4 tablets (400 mg) at bedtime    . dicyclomine (BENTYL) 20 MG tablet Take 20 mg by mouth 2 (two) times daily as needed for spasms.    Marland Kitchen donepezil (ARICEPT) 5 MG tablet Take 5 mg by mouth at bedtime.    Marland Kitchen doxycycline (VIBRA-TABS) 100 MG tablet Take 100 mg by mouth 2 (two) times daily.    Marland Kitchen gabapentin (NEURONTIN) 300 MG capsule Take 300 mg by mouth 3 (three) times daily.    Marland Kitchen guaifenesin (ROBITUSSIN) 100 MG/5ML syrup Take 200 mg by mouth 4 (four) times daily as needed for cough.    . haloperidol (HALDOL) 5 MG tablet Take 5 mg by mouth at bedtime.    . hydrochlorothiazide (HYDRODIURIL) 25 MG tablet Take 25 mg by mouth daily.    Marland Kitchen ibuprofen (ADVIL,MOTRIN) 200 MG tablet Take 400 mg by mouth 2 (two) times daily as needed (pain).     Marland Kitchen ipratropium-albuterol (DUONEB) 0.5-2.5 (3) MG/3ML SOLN Take 3 mLs by nebulization 3 (three) times daily. 360 mL 1  . lactulose (CHRONULAC) 10 GM/15ML solution Take 30 g by mouth 2 (two) times daily.    Marland Kitchen lisinopril (PRINIVIL,ZESTRIL) 20 MG tablet Take 20 mg by mouth daily.    . metFORMIN (GLUCOPHAGE) 500 MG tablet Take 500 mg by mouth 2 (two) times daily with a meal.    . metoprolol tartrate (LOPRESSOR) 25 MG tablet Take 25 mg by mouth 2 (two) times daily.    . Multiple Vitamin (MULITIVITAMIN WITH MINERALS) TABS Take 1 tablet by mouth daily.    . pantoprazole (PROTONIX) 40 MG tablet Take 1 tablet (40 mg total) by mouth daily. 30 tablet 1  . polyethylene glycol (MIRALAX / GLYCOLAX) packet Take 17 g by mouth daily as needed (constipation). Mix in 8 oz liquid  and drink    . senna (SENOKOT) 8.6 MG TABS Take 1 tablet by mouth 2 (two) times daily.    Marland Kitchen senna-docusate (SENOKOT-S) 8.6-50 MG tablet Take 1 tablet by mouth at bedtime as needed for mild constipation. 30 tablet 1  . simvastatin (ZOCOR) 5 MG tablet Take 5 mg by mouth at bedtime.    . [EXPIRED] sulfamethoxazole-trimethoprim (BACTRIM DS,SEPTRA DS) 800-160 MG tablet Take 1 tablet by mouth 2 (two) times daily. 14 tablet 0  . trihexyphenidyl (ARTANE) 5 MG tablet Take 5 mg by mouth 2 (two) times daily with a meal.    . aspirin EC 81 MG tablet Take 1 tablet (81 mg total) by mouth daily. (Patient not taking: Reported on 08/20/2015) 30 tablet 0  . fentaNYL (DURAGESIC) 50 MCG/HR Place 1 patch (50 mcg total) onto the skin every 3 (three) days. (Patient not taking: Reported on 08/20/2015) 5 patch 0  . oxyCODONE-acetaminophen (PERCOCET/ROXICET) 5-325 MG tablet Take 1 tablet by mouth every 4 (four) hours as needed for moderate pain. (Patient not taking: Reported on 08/20/2015) 30 tablet 0    Home: Home Living Family/patient expects to be discharged to::  Assisted living  Functional History:   Functional Status:  Mobility:          ADL:    Cognition: Cognition Orientation Level: Oriented X4    Blood pressure 118/82, pulse 92, temperature 99.4 F (37.4 C), temperature source Oral, resp. rate 16, height 5' 11.5" (1.816 m), weight 69.6 kg (153 lb 7 oz), SpO2 93 %. Physical Exam  Nursing note and vitals reviewed. Constitutional: He is oriented to person, place, and time. He appears well-developed and well-nourished.  HENT:  Head: Normocephalic and atraumatic.  Mouth/Throat: Oropharynx is clear and moist.  Eyes: Conjunctivae are normal. Pupils are equal, round, and reactive to light. No scleral icterus.  Neck: Normal range of motion. Neck supple.  Cardiovascular: Normal rate and regular rhythm.  No murmur heard. Respiratory: Breath sounds normal.  No stridor. He has no wheezes.  GI: Bowel sounds are normal. He exhibits no distension. There is no tenderness.  Musculoskeletal: He exhibits no edema.  R-BKA with compressive sock and VAC in place. LLE with healing bypass incision and multiple healed abrasions.  Neurological: He is alert and oriented to person, place, and time.  Mild dysarthria. Able to follow basic commands without difficulty. Tardive dyskinesias --smacking movements of lips noted. Moves BUE and LLE without difficulty. No dysesthesias R-BKA -HF/KE 3+/5. BUE/LLE 4/5 proximal to distal. Sensation intact.  Skin: Skin is warm and dry. He is not diaphoretic.     Lab Results Last 24 Hours    Results for orders placed or performed during the hospital encounter of 08/21/15 (from the past 24 hour(s))  Glucose, capillary Status: None   Collection Time: 08/27/15 10:24 AM  Result Value Ref Range   Glucose-Capillary 94 65 - 99 mg/dL  Glucose, capillary Status: Abnormal   Collection Time: 08/27/15 12:28 PM  Result Value Ref Range   Glucose-Capillary 157 (H) 65 - 99 mg/dL  Glucose, capillary Status: Abnormal   Collection Time: 08/27/15 5:15 PM  Result Value Ref Range   Glucose-Capillary 124 (H) 65 - 99 mg/dL  Glucose, capillary Status: Abnormal   Collection Time: 08/27/15 9:04 PM  Result Value Ref Range   Glucose-Capillary 137 (H) 65 - 99 mg/dL  Glucose, capillary Status: Abnormal   Collection Time: 08/28/15 7:32 AM  Result Value Ref Range   Glucose-Capillary 195 (H) 65 - 99 mg/dL      Imaging Results (Last 48 hours)    No results found.    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 Does the need for close, 24 hr/day medical supervision in concert with the patient's rehab needs make it unreasonable for this patient to be served in a less intensive setting? Yes 2. Co-Morbidities requiring  supervision/potential complications: ARF/COPD, DM2, schizophrenia, copd 3. Due to bladder management, bowel management, safety, skin/wound care, disease management, medication administration, pain management and patient education, does the patient require 24 hr/day rehab nursing? Yes 4. Does the patient require coordinated care of a physician, rehab nurse, PT (1-2 hrs/day, 5 days/week) and OT (1-2 hrs/day, 5 days/week) to address physical and functional deficits in the context of the above medical diagnosis(es)? Yes Addressing deficits in the following areas: balance, endurance, locomotion, strength, transferring, bowel/bladder control, bathing, dressing, feeding, grooming, toileting and psychosocial support 5. Can the patient actively participate in an intensive therapy program of at least 3 hrs of therapy per day at least 5 days per week? Yes 6. The potential for patient to make measurable gains while on inpatient rehab is excellent  7. Anticipated functional outcomes upon discharge from inpatient rehab are modified independent and supervision with PT, modified independent and supervision/occasional min assist? with OT, n/a with SLP. 8. Estimated rehab length of stay to reach the above functional goals is: potentially 12-15 days 9. Does the patient have adequate social supports and living environment to accommodate these discharge functional goals? Potentially 10. Anticipated D/C setting: Home 11. Anticipated post D/C treatments: HH therapy and Outpatient therapy 12. Overall Rehab/Functional Prognosis: excellent  RECOMMENDATIONS: This patient's condition is appropriate for continued rehabilitative care in the following setting: CIR Patient has agreed to participate in recommended program. Yes Note that insurance prior authorization may be required for reimbursement for recommended care.  Comment: Need to make sure his ALF could provide for projected supervision goals (I would think it could).  Rehab Admissions Coordinator to follow up.  Thanks,  Meredith Staggers, MD, Mellody Drown     08/28/2015       Revision History     Date/Time User Provider Type Action   08/28/2015 11:31 AM Meredith Staggers, MD Physician Sign   08/28/2015 8:32 AM Bary Leriche, PA-C Physician Assistant Share   View Details Report       Routing History     Date/Time From To Method   08/28/2015 11:31 AM Meredith Staggers, MD Mabeline Caras, NP Fax

## 2015-08-28 NOTE — Discharge Summary (Addendum)
Physician Discharge Summary   Patient ID: Craig Bentley MRN: [id number] DOB/AGE: 1955/09/09 60 y.o.  Admit date: 08/21/2015 Discharge date: 08/28/2015  Primary Care Physician:  Vinnie Langton, NP  Discharge Diagnoses:    . 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 Schizophrenia (HCC) . PVD (peripheral vascular disease) (HCC) . Essential hypertension . COPD (chronic obstructive pulmonary disease) (HCC) . Diabetes mellitus with neurologic complication, without long-term current use of insulin (HCC) . ARF (acute renal failure) (HCC) resolved  . Acute encephalopathy resolved  . Diabetic osteomyelitis [facility])  Consults:  Orthopedics, Dr. Lajoyce Corners Vascular surgery  Recommendations for Outpatient Follow-up:  1. Please repeat CBC/BMET at next visit 2. Follow-up with Dr. Lajoyce Corners in 2 weeks 3.  Neg Press Wound Therapy / Incisional    Complete by:  As directed  Discontinue dressing and wound VAC in 1 week. If the wound VAC begins alarming disc continue back at that time and remove dressing. Continue to wear the stump shrinker around-the-clock directly against the surgical incision.        DIET: Carb modified diet    Allergies:  No Known Allergies   DISCHARGE MEDICATIONS: Current Discharge Medication List    START taking these medications   Details  traMADol (ULTRAM) 50 MG tablet Take 1 tablet (50 mg total) by mouth every 6 (six) hours as needed for moderate pain or severe pain (or Headache unrelieved by tylenol). Qty: 20 tablet, Refills: 0      CONTINUE these medications which have CHANGED   Details  cloZAPine (CLOZARIL) 100 MG tablet Take 1-4 tablets (100-400 mg total) by mouth 3 (three) times daily. Take 1 tablet (100 mg) by mouth daily at 8am and 4pm, take 4 tablets (400 mg) at bedtime Qty: 20 tablet, Refills: 0    doxycycline (VIBRA-TABS) 100 MG tablet Take 1 tablet (100 mg total) by mouth 2 (two) times daily. X  2 weeks    haloperidol (HALDOL) 5 MG  tablet Take 1 tablet (5 mg total) by mouth at bedtime. Qty: 10 tablet, Refills: 0    oxyCODONE-acetaminophen (PERCOCET/ROXICET) 5-325 MG tablet Take 1 tablet by mouth every 6 (six) hours as needed for moderate pain or severe pain. Qty: 10 tablet, Refills: 0    trihexyphenidyl (ARTANE) 5 MG tablet Take 1 tablet (5 mg total) by mouth 2 (two) times daily with a meal. Qty: 20 tablet, Refills: 0      CONTINUE these medications which have NOT CHANGED   Details  acetaminophen (TYLENOL) 325 MG tablet Take 650 mg by mouth every 6 (six) hours as needed for mild pain, fever or headache.     albuterol (PROAIR HFA) 108 (90 Base) MCG/ACT inhaler Inhale 2 puffs into the lungs every 6 (six) hours as needed for shortness of breath (cough).    albuterol (PROVENTIL) (2.5 MG/3ML) 0.083% nebulizer solution Take 3 mLs (2.5 mg total) by nebulization every 4 (four) hours as needed for wheezing. Qty: 75 mL, Refills: 12    dicyclomine (BENTYL) 20 MG tablet Take 20 mg by mouth 2 (two) times daily as needed for spasms.    donepezil (ARICEPT) 5 MG tablet Take 5 mg by mouth at bedtime.    gabapentin (NEURONTIN) 300 MG capsule Take 300 mg by mouth 3 (three) times daily.    guaifenesin (ROBITUSSIN) 100 MG/5ML syrup Take 200 mg by mouth 4 (four) times daily as needed for cough.    hydrochlorothiazide (HYDRODIURIL) 25 MG tablet Take 25 mg by mouth daily.  ibuprofen (ADVIL,MOTRIN) 200 MG tablet Take 400 mg by mouth 2 (two) times daily as needed (pain).     ipratropium-albuterol (DUONEB) 0.5-2.5 (3) MG/3ML SOLN Take 3 mLs by nebulization 3 (three) times daily. Qty: 360 mL, Refills: 1    lactulose (CHRONULAC) 10 GM/15ML solution Take 30 g by mouth 2 (two) times daily.    lisinopril (PRINIVIL,ZESTRIL) 20 MG tablet Take 20 mg by mouth daily.    metFORMIN (GLUCOPHAGE) 500 MG tablet Take 500 mg by mouth 2 (two) times daily with a meal.    metoprolol tartrate (LOPRESSOR) 25 MG tablet Take 25 mg by mouth 2 (two)  times daily.    Multiple Vitamin (MULITIVITAMIN WITH MINERALS) TABS Take 1 tablet by mouth daily.    pantoprazole (PROTONIX) 40 MG tablet Take 1 tablet (40 mg total) by mouth daily. Qty: 30 tablet, Refills: 1    polyethylene glycol (MIRALAX / GLYCOLAX) packet Take 17 g by mouth daily as needed (constipation). Mix in 8 oz liquid and drink    senna (SENOKOT) 8.6 MG TABS Take 1 tablet by mouth 2 (two) times daily.    senna-docusate (SENOKOT-S) 8.6-50 MG tablet Take 1 tablet by mouth at bedtime as needed for mild constipation. Qty: 30 tablet, Refills: 1    simvastatin (ZOCOR) 5 MG tablet Take 5 mg by mouth at bedtime.    aspirin EC 81 MG tablet Take 1 tablet (81 mg total) by mouth daily. Qty: 30 tablet, Refills: 0      STOP taking these medications     sulfamethoxazole-trimethoprim (BACTRIM DS,SEPTRA DS) 800-160 MG tablet      fentaNYL (DURAGESIC) 50 MCG/HR          Brief H and P: For complete details please refer to admission H and P, but in brief 61 y.o. male with past medical history significant for peripheral vascular disease status post aorto femoral bypass and right foot fifth toe amputation in March 2017, hypertension, diabetes mellitus, hyperlipidemia and COPD. Patient was brought from his living facility to ER because of more confusion felt to be because of fentanyl patch which was subsequently discontinued. In ER, patient was found to be hypotensive, febrile with Tmax 102F, tachycardic in addition to having leukocytosis and x-ray findings of right foot possible osteomyelitis. Patient was seen by vascular surgery as well as orthopedic surgery.  Hospital Course:  Right foot MRSA diabetic osteomyelitis: Postop day #1, wound cultures positive for MRSA - Patient met SIRS criteria for sepsis on admission given fever, tachycardia and hypotension as well as source being infection seen on plain films. Blood cultures negative, hence sepsis ruled out - Vascular surgery, orthopedic  surgery was consulted. - Wound culture positive for MRSA, blood cultures negative so far. Transitioned IV vancomycin and Zosyn to oral doxycycline for 2 weeks - postop day #1, H&H stable. Patient is stable from orthopedic standpoint to be discharged to skilled nursing facility for rehabilitation. Patient was also evaluated by CIR and patient was accepted to inpatient rehab    Essential hypertension:  - Stable. On metoprolol 25 twice a day.   COPD (chronic obstructive pulmonary disease) (HCC): - Stable, no wheezing. Breathing comfortably on room air.   Diabetes mellitus with neurologic complication, without long-term current use of insulin (HCC): - CBG stable, under 180. Continue Neurontin.   PVD (peripheral vascular disease) (HCC) status post aortofemoral bypass:- R CFA to PTA with composite graft, L CFA To SFA BPG w/ ips reversed GSV, R 5th toe amputation (Date: 04/25/15) - Vasc Surgery  was consulted   Schizophrenia [facility]): Stable. Patient on Aricept, Haldol and clozapine.   ARF (acute renal failure) (HCC)/acute kidney injury:  - Stable. Creatinine on admission at 1.51, now normalized with IV fluids. Secondary to initial sepsis.   Acute encephalopathy:  - Likely in the setting of osteomyelitis. Has since resolved   Day of Discharge BP 118/82 mmHg  Pulse 92  Temp(Src) 99.4 F (37.4 C) (Oral)  Resp 16  Ht 5' 11.5" (1.816 m)  Wt 69.6 kg (153 lb 7 oz)  BMI 21.10 kg/m2  SpO2 93%  Physical Exam: General: Alert and awake oriented x3 not in any acute distress. HEENT: anicteric sclera, pupils reactive to light and accommodation CVS: S1-S2 clear no murmur rubs or gallops Chest: clear to auscultation bilaterally, no wheezing rales or rhonchi Abdomen: soft nontender, nondistended, normal bowel sounds Extremities: Right BKA Neuro: Cranial nerves II-XII intact, no focal neurological deficits   The results of significant diagnostics from this hospitalization (including  imaging, microbiology, ancillary and laboratory) are listed below for reference.    LAB RESULTS: Basic Metabolic Panel:  Recent Labs Lab 08/27/15 0605 08/28/15 1037  NA 140 138  K 3.8 4.0  CL 105 104  CO2 28 26  GLUCOSE 115* 153*  BUN 10 9  CREATININE 0.71 0.61  CALCIUM 9.0 9.1   Liver Function Tests:  Recent Labs Lab 08/21/15 1735 08/22/15 0312  AST 24 25  ALT 21 25  ALKPHOS 48 55  BILITOT 0.6 0.7  PROT 5.3* 6.1*  ALBUMIN 2.0* 2.2*   No results for input(s): LIPASE, AMYLASE in the last 168 hours.  Recent Labs Lab 08/22/15 0413  AMMONIA 32   CBC:  Recent Labs Lab 08/23/15 1507 08/28/15 1037  WBC 7.0 13.9*  13.2*  NEUTROABS  --  10.7*  HGB 9.6* 9.6*  9.6*  HCT 31.0* 31.5*  31.7*  MCV 84.0 85.8  85.7  PLT 305 383  360   Cardiac Enzymes: No results for input(s): CKTOTAL, CKMB, CKMBINDEX, TROPONINI in the last 168 hours. BNP: Invalid input(s): POCBNP CBG:  Recent Labs Lab 08/28/15 0732 08/28/15 1155  GLUCAP 195* 154*    Significant Diagnostic Studies:  Ct Head Wo Contrast  08/21/2015  CLINICAL DATA:  Acute mental status change. EXAM: CT HEAD WITHOUT CONTRAST TECHNIQUE: Contiguous axial images were obtained from the base of the skull through the vertex without intravenous contrast. COMPARISON:  CT of the brain June 10, 2009 FINDINGS: Paranasal sinuses, mastoid air cells, and bones are normal. Mild soft tissue swelling over the posterior calvarium near the vertex. Recommend clinical correlation. Extracranial soft tissues are otherwise within normal limits. No subdural, epidural, or subarachnoid hemorrhage. The cerebellum, brainstem, and basal cisterns are within normal limits. Ventricles and sulci are unremarkable. No acute cortical ischemia or infarct. No mass, mass effect, or midline shift. IMPRESSION: No acute intracranial process. Electronically Signed   By: Gerome Sam III M.D   On: 08/21/2015 23:07   Dg Chest Port 1 View  08/21/2015   CLINICAL DATA:  Pt arrives from [facility] via [place]. Pt was [facility] at [facility] yesterday for generalized weakness, transported out today for same. Pt was hypotensive on scene and remains that way upon arrival. Diaphoretic. EXAM: PORTABLE CHEST 1 VIEW COMPARISON:  04/24/2015 FINDINGS: Heart size is within normal limits. There is minimal density at the left lung base which may represent residual atelectasis or scarring. Overall the appearance is improved since the prior study. The right lung is clear. No pulmonary edema.  IMPRESSION: 1. Minimal left lower lobe density, which may represent chronic atelectasis or infiltrate. 2. Is hard to entirely exclude a recurrent left lower lobe infectious process. Electronically Signed   By: Norva Pavlov M.D.   On: 08/21/2015 16:15   Dg Foot Complete Right  08/21/2015  CLINICAL DATA:  RIGHT foot pain for an unknown period of time, unresponsive, diabetes mellitus, hypertension, peripheral vascular disease EXAM: RIGHT FOOT COMPLETE - 3+ VIEW COMPARISON:  04/02/2015 FINDINGS: Marked osseous demineralization. Joint spaces preserved. Dressing artifacts at the amputation bed from fifth toe amputation. Bone destruction of the fifth metatarsal head consistent with osteomyelitis. No fracture, dislocation, or additional bone destruction. Scattered soft tissue swelling RIGHT foot along the lateral and dorsal margins. IMPRESSION: Prior amputation of the fifth toe with destruction of the fifth metatarsal head consistent with osteomyelitis. Osseous demineralization. Electronically Signed   By: Ulyses Southward M.D.   On: 08/21/2015 17:12    2D ECHO:   Disposition and Follow-up: Discharge Instructions    Diet Carb Modified    Complete by:  As directed      Increase activity slowly    Complete by:  As directed      Neg Press Wound Therapy / Incisional    Complete by:  As directed   Discontinue dressing and wound VAC in 1 week. If the wound VAC begins alarming disc continue back at that time  and remove dressing. Continue to wear the stump shrinker around-the-clock directly against the surgical incision.            DISPOSITION: inpatient rehab   DISCHARGE FOLLOW-UP Follow-up Information    Follow up with DUDA,MARCUS V, MD. Schedule an appointment as soon as possible for a visit in 2 weeks.   Specialty:  Orthopedic Surgery   Why:  for hospital follow-up   Contact information:   [address] [place] [address] [phone]       Follow up with Vinnie Langton, NP. Schedule an appointment as soon as possible for a visit in 2 weeks.   Specialty:  Nurse Practitioner   Why:  for hospital follow-up   Contact information:   [address] [place] [address] [phone]        Time spent on Discharge:   Signed:   Peace Jost M.D. [facility] 08/28/2015, 12:34 PM Pager: [phone]

## 2015-08-28 NOTE — PMR Pre-admission (Signed)
PMR Admission Coordinator Pre-Admission Assessment  Patient: Craig Bentley is an 60 y.o., male MRN: [id number] DOB: [birth year] Height: 5' 11.5" (181.6 cm) Weight: 69.6 kg (153 lb 7 oz)              Insurance Information HMO:     PPO:       PCP:       IPA:       80/20:       OTHER:   PRIMARY:  Medicaid [employer]      Policy#: [id number]      Subscriber: Janora Norlander CM Name:        Phone#:       Fax#:   Pre-Cert#:        Employer: Disabled Benefits:  Phone #: [phone]     Name: Automated Eff. Date: Eligible on 08/28/15 with coverage code [id number]     Deduct:        Out of Pocket Max:        Life Max:   CIR:        SNF:   Outpatient:       Co-Pay:   Home Health:        Co-Pay:   DME:       Co-Pay:   Providers:    Emergency Contact Information Contact Information    Name Relation Home Work Mobile   [place] Sister [phone]       Current Medical History  Patient Admitting Diagnosis:  R BKA  History of Present Illness: A 60 y.o. 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 He was admitted on 08/21/15 with lethargy and hypotension felt to be related to fentanyl--improved with narcan. He was started on IVF and IV antibiotics due to sepsis and acute renal failure. Right foot not felt to be salvageable per Dr. Bridgett Larsson and Dr. Sharol Given. 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 Sharol Given. Therapy evaluations done today and patient felt to be good CIR candidate.     Past Medical History  Past Medical History  Diagnosis Date  . Hypertension   . Diabetes mellitus   . Schizophrenia, schizo-affective ([place])   . COPD (chronic obstructive pulmonary disease) ([place])   . Constipation   . Fecal impaction ([place])   . Pneumonia   . Anxiety     Family History  family history includes Mental illness  in his mother.  Prior Rehab/Hospitalizations: Was receiving HHPT at [facility]  Has the patient had major surgery during 100 days prior to admission? No  Current Medications   Current facility-administered medications:  .  0.9 %  sodium chloride infusion, , Intravenous, Continuous, Newt Minion, MD .  acetaminophen (TYLENOL) tablet 650 mg, 650 mg, Oral, Q6H PRN, 650 mg at 08/26/15 1831 **OR** acetaminophen (TYLENOL) suppository 650 mg, 650 mg, Rectal, Q6H PRN, Rise Patience, MD .  acetaminophen (TYLENOL) tablet 650 mg, 650 mg, Oral, Q6H PRN **OR** acetaminophen (TYLENOL) suppository 650 mg, 650 mg, Rectal, Q6H PRN, Newt Minion, MD .  Chlorhexidine Gluconate Cloth 2 % PADS 6 each, 6 each, Topical, Q0600, Annita Brod, MD, 6 each at 08/27/15 0600 .  cloZAPine (CLOZARIL) tablet 100 mg, 100 mg, Oral, 2 times per day, Rise Patience, MD, 100 mg at 08/28/15 0847 .  cloZAPine (CLOZARIL) tablet 400  mg, 400 mg, Oral, QHS, Rise Patience, MD, 400 mg at 08/27/15 2138 .  dicyclomine (BENTYL) tablet 20 mg, 20 mg, Oral, BID PRN, Rise Patience, MD .  donepezil (ARICEPT) tablet 5 mg, 5 mg, Oral, QHS, Rise Patience, MD, 5 mg at 08/27/15 2138 .  doxycycline (VIBRA-TABS) tablet 100 mg, 100 mg, Oral, Q12H, Ripudeep K Rai, MD, 100 mg at 08/28/15 1242 .  gabapentin (NEURONTIN) capsule 300 mg, 300 mg, Oral, TID, Karmen Bongo, MD, 300 mg at 08/28/15 0846 .  guaiFENesin (ROBITUSSIN) 100 MG/5ML solution 200 mg, 10 mL, Oral, Q6H PRN, Rise Patience, MD .  haloperidol (HALDOL) tablet 5 mg, 5 mg, Oral, QHS, Rise Patience, MD, 5 mg at 08/27/15 2139 .  heparin injection 5,000 Units, 5,000 Units, Subcutaneous, Q8H, Ripudeep K Rai, MD .  hydrALAZINE (APRESOLINE) injection 10 mg, 10 mg, Intravenous, Q4H PRN, Rise Patience, MD .  HYDROmorphone (DILAUDID) injection 1 mg, 1 mg, Intravenous, Q2H PRN, Newt Minion, MD .  insulin aspart (novoLOG) injection 0-9 Units, 0-9  Units, Subcutaneous, TID WC, Rise Patience, MD, 2 Units at 08/28/15 1242 .  ipratropium-albuterol (DUONEB) 0.5-2.5 (3) MG/3ML nebulizer solution 3 mL, 3 mL, Nebulization, Q4H PRN, Karmen Bongo, MD .  lactated ringers infusion, , Intravenous, Continuous, Oleta Mouse, MD, Stopped at 08/27/15 1217 .  lactulose (CHRONULAC) 10 GM/15ML solution 30 g, 30 g, Oral, BID, Rise Patience, MD, 30 g at 08/28/15 0846 .  methocarbamol (ROBAXIN) tablet 500 mg, 500 mg, Oral, Q6H PRN **OR** methocarbamol (ROBAXIN) 500 mg in dextrose 5 % 50 mL IVPB, 500 mg, Intravenous, Q6H PRN, Newt Minion, MD .  metoCLOPramide (REGLAN) tablet 5-10 mg, 5-10 mg, Oral, Q8H PRN **OR** metoCLOPramide (REGLAN) injection 5-10 mg, 5-10 mg, Intravenous, Q8H PRN, Newt Minion, MD .  metoprolol tartrate (LOPRESSOR) tablet 25 mg, 25 mg, Oral, BID, Rise Patience, MD, 25 mg at 08/28/15 0846 .  mupirocin ointment (BACTROBAN) 2 % 1 application, 1 application, Nasal, BID, Annita Brod, MD, 1 application at 08/15/74 0847 .  ondansetron (ZOFRAN) tablet 4 mg, 4 mg, Oral, Q6H PRN **OR** ondansetron (ZOFRAN) injection 4 mg, 4 mg, Intravenous, Q6H PRN, Rise Patience, MD .  ondansetron (ZOFRAN) tablet 4 mg, 4 mg, Oral, Q6H PRN **OR** ondansetron (ZOFRAN) injection 4 mg, 4 mg, Intravenous, Q6H PRN, Newt Minion, MD .  oxyCODONE (Oxy IR/ROXICODONE) immediate release tablet 5-10 mg, 5-10 mg, Oral, Q3H PRN, Newt Minion, MD, 5 mg at 08/27/15 2145 .  oxyCODONE-acetaminophen (PERCOCET/ROXICET) 5-325 MG per tablet 1 tablet, 1 tablet, Oral, Q6H PRN, Gardiner Barefoot, NP, 1 tablet at 08/27/15 0529 .  pantoprazole (PROTONIX) EC tablet 40 mg, 40 mg, Oral, Daily, Rise Patience, MD, 40 mg at 08/28/15 0846 .  simvastatin (ZOCOR) tablet 5 mg, 5 mg, Oral, QHS, Rise Patience, MD, 5 mg at 08/27/15 2138 .  traMADol (ULTRAM) tablet 50 mg, 50 mg, Oral, Q6H PRN, Gardiner Barefoot, NP .  trihexyphenidyl (ARTANE) tablet 5  mg, 5 mg, Oral, BID WC, Rise Patience, MD, 5 mg at 08/28/15 0847  Patients Current Diet: Diet Carb Modified Fluid consistency:: Thin; Room service appropriate?: Yes Diet Carb Modified  Precautions / Restrictions Precautions Precautions: Fall Restrictions Weight Bearing Restrictions: No   Has the patient had 2 or more falls or a fall with injury in the past year?No  Prior Activity Level Household: Has lived at [facility] [place] for 25 years  Home  Assistive Devices / Equipment Home Assistive Devices/Equipment: Cane (specify quad or straight) (straight) Home Equipment: Walker - 4 wheels, Shower seat  Prior Device Use: Indicate devices/aids used by the patient prior to current illness, exacerbation or injury? Walker and [employer]  Prior Functional Level Prior Function Level of Independence: Needs assistance Gait / Transfers Assistance Needed: uses a walker with a seat ADL's / Homemaking Assistance Needed: reports he can dress himself and has assistance for showering  Self Care: Did the patient need help bathing, dressing, using the toilet or eating?  Needed some help  Indoor Mobility: Did the patient need assistance with walking from room to room (with or without device)? Independent  Stairs: Did the patient need assistance with internal or external stairs (with or without device)? Needed some help  Functional Cognition: Did the patient need help planning regular tasks such as shopping or remembering to take medications? Needed some help  Current Functional Level Cognition  Overall Cognitive Status: Within Functional Limits for tasks assessed Orientation Level: Oriented X4    Extremity Assessment (includes Sensation/Coordination)  Upper Extremity Assessment: Overall WFL for tasks assessed  Lower Extremity Assessment: RLE deficits/detail, LLE deficits/detail RLE Deficits / Details: weak but moves in all planes and against gravity with minimal resistance LLE Deficits / Details:  weak, but functional to stand    ADLs       Mobility  Overal bed mobility: Needs Assistance Bed Mobility: Supine to Sit, Sit to Supine Supine to sit: Min assist Sit to supine: Min guard General bed mobility comments: stability assist and cues for technique    Transfers  Overall transfer level: Needs assistance Equipment used: Rolling walker (2 wheeled) Transfers: Sit to/from Stand Sit to Stand: Min assist General transfer comment: stability assist and cues for safe hand placement    Ambulation / Gait / Stairs / Wheelchair Mobility  Ambulation/Gait General Gait Details: pt deferred    Posture / Balance Dynamic Sitting Balance Sitting balance - Comments: accepted moderate challenge without UE's Balance Overall balance assessment: Needs assistance Sitting balance-Leahy Scale: Good Sitting balance - Comments: accepted moderate challenge without UE's Standing balance support: Bilateral upper extremity supported Standing balance-Leahy Scale: Poor Standing balance comment: reliant on UE's    Special needs/care consideration BiPAP/CPAP No CPM No Continuous Drip IV KVO Dialysis No         Life Vest No Oxygen No Special Bed No Trach Size No Wound Vac (area) Yes, right BKA stump area      Skin Has a new right BKA incision                             Bowel mgmt: Last BM 08/25/2015 Bladder mgmt: Voiding WDL Diabetic mgmt: Yes     Previous Vivian Name: [place]: Yes Type of Home Care Services: Home PT (pt states that PT sees him in the [place]) Additional Comments: ramp to enter  Discharge Living Setting Plans for Discharge Living Setting: Other (Comment) (Resides at [facility]F) Type of Home at Discharge: [place] Name at Discharge: [facility] Discharge Home Access: Ramped entrance Does the patient have any problems obtaining your medications?: No  Social/Family/Support  Systems Anticipated Caregiver: ALF  Ability/Limitations of Caregiver: ALF helps with bathing, but does not help with toileting needs Caregiver Availability: Intermittent Discharge Plan Discussed with Primary Caregiver: No (No answer from ALF at [phone]) Does Caregiver/Family have  Issues with Lodging/Transportation while Pt is in Rehab?: No  Goals/Additional Needs Patient/Family Goal for Rehab: PT mod I and supervision, OT mod I to min assist goals Expected length of stay: 12-15 days Cultural Considerations: None Dietary Needs: Carb mod, med calorie, thin liquids Equipment Needs: TBD Pt/Family Agrees to Admission and willing to participate: Yes Program Orientation Provided & Reviewed with Pt/Caregiver Including Roles  & Responsibilities: Yes  Decrease burden of Care through IP rehab admission: N/A  Possible need for SNF placement upon discharge: Not planned  Patient Condition: This patient's condition remains as documented in the consult dated 08/28/15, in which the Rehabilitation Physician determined and documented that the patient's condition is appropriate for intensive rehabilitative care in an inpatient rehabilitation facility pending ALF ability to provide some supervision.  [facility] assists with bathing, but not with toileting needs.  These areas have been addressed. I did discuss the above with Dr. Naaman Plummer.  Will admit to inpatient rehab today.  Preadmission Screen Completed By:  Retta Diones, 08/28/2015 2:23 PM ______________________________________________________________________   Discussed status with Dr. Naaman Plummer on 08/28/15 at 1423 and received telephone approval for admission today.  Admission Coordinator:  Retta Diones, time1423/Date07/06/17

## 2015-08-28 NOTE — Progress Notes (Signed)
Patient admitting to CIR. CSW signing off.  Craig Bentley LCSWA [phone]  

## 2015-08-28 NOTE — Evaluation (Signed)
Physical Therapy Evaluation Patient Details Name: Craig Bentley MRN: [id number] DOB: Sep 30, 1955 Today's Date: 08/28/2015   History of Present Illness  pt is a 60 y/o male with h/o of HTN DM Schizophrenia, COPD with recent R 5th toe amp along with bypass grafting, now presents with dry gangrenous changes to the Right foot.  Pt s/p R transtibial amputation 7/5.  Clinical Impression  Pt heading immenently to SNF.  Initiated amputee education and basic mobility.  Hopefully patient will get some nursing education and mobility.    Follow Up Recommendations SNF    Equipment Recommendations       Recommendations for Other Services       Precautions / Restrictions Precautions Precautions: Fall Required Braces or Orthoses:  (Shrinker sock applied R)      Mobility  Bed Mobility Overal bed mobility: Needs Assistance Bed Mobility: Supine to Sit;Sit to Supine     Supine to sit: Min assist Sit to supine: Min guard   General bed mobility comments: stability assist and cues for technique  Transfers Overall transfer level: Needs assistance Equipment used: Rolling walker (2 wheeled) Transfers: Sit to/from Stand Sit to Stand: Min assist         General transfer comment: stability assist and cues for safe hand placement  Ambulation/Gait             General Gait Details: pt deferred  Stairs            Wheelchair Mobility    Modified Rankin (Stroke Patients Only)       Balance Overall balance assessment: Needs assistance   Sitting balance-Leahy Scale: Good Sitting balance - Comments: accepted moderate challenge without UE's   Standing balance support: Bilateral upper extremity supported Standing balance-Leahy Scale: Poor Standing balance comment: reliant on UE's                             Pertinent Vitals/Pain Pain Assessment: Faces Pain Score: 6  Faces Pain Scale: Hurts even more Pain Location: R stump incision Pain Descriptors / Indicators:  Sore;Sharp Pain Intervention(s): Limited activity within patient's tolerance;Monitored during session;Repositioned    Home Living Family/patient expects to be discharged to:: Assisted living               Home Equipment: Walker - 4 wheels;Shower seat Additional Comments: ramp to enter    Prior Function Level of Independence: Needs assistance   Gait / Transfers Assistance Needed: uses a walker with a seat  ADL's / Homemaking Assistance Needed: reports he can dress himself and has assistance for showering        Hand Dominance   Dominant Hand: Right    Extremity/Trunk Assessment   Upper Extremity Assessment: Overall WFL for tasks assessed           Lower Extremity Assessment: RLE deficits/detail;LLE deficits/detail RLE Deficits / Details: weak but moves in all planes and against gravity with minimal resistance LLE Deficits / Details: weak, but functional to stand     Communication   Communication: No difficulties  Cognition Arousal/Alertness: Awake/alert;Lethargic Behavior During Therapy: WFL for tasks assessed/performed Overall Cognitive Status: Within Functional Limits for tasks assessed                      General Comments General comments (skin integrity, edema, etc.): Initiated amputee instruction with exercises, positioning and general progression    Exercises Amputee Exercises Gluteal Sets: AROM;10 reps;Supine Hip Extension: AROM;Right;5 reps  Hip ABduction/ADduction: AROM;Right;10 reps;Supine Hip Flexion/Marching: AROM;Strengthening;Right;10 reps;Supine Knee Flexion: AROM;Right;10 reps;Supine      Assessment/Plan    PT Assessment Patient needs continued PT services  PT Diagnosis Acute pain;Generalized weakness   PT Problem List Decreased strength;Decreased activity tolerance;Decreased mobility;Decreased knowledge of use of DME;Pain;Decreased balance  PT Treatment Interventions Gait training;DME instruction;Functional mobility  training;Therapeutic activities   PT Goals (Current goals can be found in the Care Plan section) Acute Rehab PT Goals PT Goal Formulation: All assessment and education complete, DC therapy Potential to Achieve Goals: Good    Frequency     Barriers to discharge        Co-evaluation               End of Session   Activity Tolerance: Patient tolerated treatment well Patient left: in bed;with call bell/phone within reach Nurse Communication: Mobility status         Time: 5366-4403 PT Time Calculation (min) (ACUTE ONLY): 19 min   Charges:   PT Evaluation $PT Eval Moderate Complexity: 1 Procedure     PT G Codes:        Walden Statz, Eliseo Gum 08/28/2015, 1:52 PM

## 2015-08-28 NOTE — Progress Notes (Signed)
Patient ID: Craig Bentley, male   DOB: [birth year], 60 y.o.   MRN: [id number] Postoperative day 1 transtibial amputation. The wound VAC is dry without drainage. The compression stocking is fitting well. Patient has no complaints. He states he's ready for discharge to skilled care.

## 2015-08-28 NOTE — Progress Notes (Signed)
CSW spoke with patient and family at bedside. Patient is in agreement to go to SNF for rehab. CSW explained that patient would not be able to smoke at a [facility] facility. Patient expressed understanding and would like to go for 30 days.  Percell Locus Breonna Gafford LCSWA [phone]

## 2015-08-29 ENCOUNTER — Inpatient Hospital Stay (HOSPITAL_COMMUNITY): Payer: Medicaid Other

## 2015-08-29 ENCOUNTER — Inpatient Hospital Stay (HOSPITAL_COMMUNITY): Payer: Medicaid Other | Admitting: Occupational Therapy

## 2015-08-29 ENCOUNTER — Inpatient Hospital Stay (HOSPITAL_COMMUNITY): Payer: Self-pay | Admitting: Physical Therapy

## 2015-08-29 DIAGNOSIS — I10 Essential (primary) hypertension: Secondary | ICD-10-CM

## 2015-08-29 DIAGNOSIS — Z89511 Acquired absence of right leg below knee: Secondary | ICD-10-CM

## 2015-08-29 DIAGNOSIS — R509 Fever, unspecified: Secondary | ICD-10-CM | POA: Insufficient documentation

## 2015-08-29 DIAGNOSIS — D72829 Elevated white blood cell count, unspecified: Secondary | ICD-10-CM | POA: Insufficient documentation

## 2015-08-29 DIAGNOSIS — E0840 Diabetes mellitus due to underlying condition with diabetic neuropathy, unspecified: Secondary | ICD-10-CM

## 2015-08-29 DIAGNOSIS — R6510 Systemic inflammatory response syndrome (SIRS) of non-infectious origin without acute organ dysfunction: Secondary | ICD-10-CM | POA: Insufficient documentation

## 2015-08-29 LAB — GLUCOSE, CAPILLARY
Glucose-Capillary: 127 mg/dL — ABNORMAL HIGH (ref 65–99)
Glucose-Capillary: 125 mg/dL — ABNORMAL HIGH (ref 65–99)
Glucose-Capillary: 105 mg/dL — ABNORMAL HIGH (ref 65–99)
Glucose-Capillary: 126 mg/dL — ABNORMAL HIGH (ref 65–99)
Glucose-Capillary: 121 mg/dL — ABNORMAL HIGH (ref 65–99)

## 2015-08-29 LAB — CBC WITH DIFFERENTIAL/PLATELET
BASOS ABS: 0 10*3/uL (ref 0.0–0.1)
BASOS PCT: 0 %
EOS ABS: 0 10*3/uL (ref 0.0–0.7)
Eosinophils Relative: 0 %
HEMATOCRIT: 29.8 % — AB (ref 39.0–52.0)
Hemoglobin: 9.2 g/dL — ABNORMAL LOW (ref 13.0–17.0)
Lymphocytes Relative: 19 %
Lymphs Abs: 2.4 10*3/uL (ref 0.7–4.0)
MCH: 26.4 pg (ref 26.0–34.0)
MCHC: 30.9 g/dL (ref 30.0–36.0)
MCV: 85.4 fL (ref 78.0–100.0)
MONO ABS: 0.9 10*3/uL (ref 0.1–1.0)
Monocytes Relative: 7 %
NEUTROS ABS: 9.6 10*3/uL — AB (ref 1.7–7.7)
Neutrophils Relative %: 74 %
PLATELETS: 358 10*3/uL (ref 150–400)
RBC: 3.49 MIL/uL — ABNORMAL LOW (ref 4.22–5.81)
RDW: 14.9 % (ref 11.5–15.5)
WBC: 12.9 10*3/uL — ABNORMAL HIGH (ref 4.0–10.5)

## 2015-08-29 LAB — COMPREHENSIVE METABOLIC PANEL
ALBUMIN: 2.4 g/dL — AB (ref 3.5–5.0)
ALT: 19 U/L (ref 17–63)
ANION GAP: 7 (ref 5–15)
AST: 20 U/L (ref 15–41)
Alkaline Phosphatase: 51 U/L (ref 38–126)
BUN: 9 mg/dL (ref 6–20)
CHLORIDE: 103 mmol/L (ref 101–111)
CO2: 29 mmol/L (ref 22–32)
Calcium: 9.2 mg/dL (ref 8.9–10.3)
Creatinine, Ser: 0.55 mg/dL — ABNORMAL LOW (ref 0.61–1.24)
GFR calc Af Amer: >60 mL/min (ref >60)
GFR calc non Af Amer: >60 mL/min (ref >60)
GLUCOSE: 115 mg/dL — AB (ref 65–99)
POTASSIUM: 4 mmol/L (ref 3.5–5.1)
SODIUM: 139 mmol/L (ref 135–145)
Total Bilirubin: 0.4 mg/dL (ref 0.3–1.2)
Total Protein: 6.3 g/dL — ABNORMAL LOW (ref 6.5–8.1)

## 2015-08-29 LAB — URINALYSIS, ROUTINE W REFLEX MICROSCOPIC
Bilirubin Urine: NEGATIVE
GLUCOSE, UA: NEGATIVE mg/dL
Hgb urine dipstick: NEGATIVE
Ketones, ur: NEGATIVE mg/dL
NITRITE: NEGATIVE
Protein, ur: NEGATIVE mg/dL
SPECIFIC GRAVITY, URINE: 1.017 (ref 1.005–1.030)
pH: 6 (ref 5.0–8.0)

## 2015-08-29 LAB — URINE MICROSCOPIC-ADD ON: RBC / HPF: NONE SEEN RBC/hpf (ref 0–5)

## 2015-08-29 NOTE — Evaluation (Signed)
Physical Therapy Assessment and Plan  Patient Details  Name: Craig Bentley MRN: [id number] Date of Birth: 11-18-1955  PT Diagnosis: Abnormality of gait, Difficulty walking, Muscle weakness and Pain in L residual limb.  Rehab Potential: Good ELOS: 7-10 days.    Today's Date: 08/29/2015 PT Individual Time: 801-915 AND 1520-1615 Calculated individual treatment time: 74 min AND 55 min       Problem List:  Patient Active Problem List   Diagnosis Date Noted  . Status post below knee amputation of right lower extremity ([place]) 08/28/2015  . 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[place]) 08/23/2015  . Sepsis ([place]) 08/21/2015  . ARF (acute renal failure) ([place]) 08/21/2015  . Acute encephalopathy 08/21/2015  . Post-operative state   . Respiratory failure ([place])   . Surgery, elective   . Anxiety   . Schizophrenia ([place])   . Atherosclerosis of extremity with gangrene ([place])   . Hyperglycemia   . Preoperative cardiovascular examination 04/11/2015  . PVD (peripheral vascular disease) ([place]) 04/10/2015  . Absolute anemia   . Diabetic foot infection ([place]) 04/02/2015  . Right foot pain 04/02/2015  . Neutrophilic leukocytosis 12/45/8099  . Esophageal abnormality   . HCAP (healthcare-associated pneumonia)   . COPD exacerbation ([place])   . Dyspnea   . Respiratory distress   . Sepsis due to pneumonia ([place]) 02/14/2015  . Acute respiratory failure with hypoxia ([place]) 02/13/2015  . Diabetes mellitus with neurologic complication, without long-term current use of insulin ([place]) 07/20/2011  . Essential hypertension   . COPD (chronic obstructive pulmonary disease) ([facility])     Past Medical History:  Past Medical History  Diagnosis Date  . Hypertension   . Diabetes mellitus   . Schizophrenia, schizo-affective ([place])   . COPD (chronic obstructive pulmonary disease) ([place])   . Constipation   . Fecal impaction ([place])   . Pneumonia   . Anxiety    Past Surgical History:  Past Surgical History  Procedure Laterality Date  .  Peripheral vascular catheterization N/A 04/07/2015    Procedure: Abdominal Aortogram;  Surgeon: Angelia Mould, MD;  Location: [facility];  Service: Cardiovascular;  Laterality: N/A;  . Aorta - bilateral femoral artery bypass graft Bilateral 04/23/2015    Procedure: AORTOBIFEMORAL BYPASS GRAFT;  Surgeon: Conrad Las Palmas II, MD;  Location: [facility];  Service: Vascular;  Laterality: Bilateral;  . Femoral-tibial bypass graft Right 04/23/2015    Procedure: Right Common Femoral to Posterior Tibial Bypass with Composite Vein Graft. ;  Surgeon: Conrad [place], MD;  Location: [facility];  Service: Vascular;  Laterality: Right;  . Amputation Right 04/23/2015    Procedure: Right Fifth Toe Amputation;  Surgeon: Conrad Daggett, MD;  Location: [facility];  Service: Vascular;  Laterality: Right;  . Femoral-femoral bypass graft Left 04/23/2015    Procedure:  Left Common Femoral Artery to Superficial Femoral Artery Bypass Graft with Vein;  Surgeon: Conrad [place]thouse, MD;  Location: [facility];  Service: Vascular;  Laterality: Left;  . Amputation Right 08/27/2015    Procedure: RIGHT BELOW KNEE AMPUTATION ;  Surgeon: Newt Minion, MD;  Location: [facility];  Service: Orthopedics;  Laterality: Right;    Assessment & Plan Clinical Impression: Patient is a 60 y.o. 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 He was admitted on 08/21/15 with lethargy and hypotension felt to be  related to fentanyl--improved with narcan. He was started on IVF and IV antibiotics due to sepsis and acute renal failure. Right foot not felt to be salvageable per Dr. Bridgett Larsson and Dr. Sharol Given. 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 Sharol Given.Patient transferred to CIR on 08/28/2015 .   Patient currently requires min with mobility secondary to muscle weakness, decreased cardiorespiratoy  endurance, decreased awareness, decreased problem solving and decreased safety awareness and decreased sitting balance, decreased standing balance and decreased postural control.  Prior to hospitalization, patient was modified independent  with mobility and lived with Other (Comment) in a Assisted living home.  Home access is  Ramped entrance.  Patient will benefit from skilled PT intervention to maximize safe functional mobility, minimize fall risk and decrease caregiver burden for planned discharge home with 24 hour supervision.  Anticipate patient will benefit from follow up [place] at discharge.  PT - End of Session Activity Tolerance: Tolerates 10 - 20 min activity with multiple rests Endurance Deficit: Yes PT Assessment Rehab Potential (ACUTE/IP ONLY): Good PT Patient demonstrates impairments in the following area(s): Endurance;Motor;Pain;Safety;Skin Integrity PT Transfers Functional Problem(s): Bed Mobility;Car;Bed to Chair;Floor;Furniture PT Locomotion Functional Problem(s): Ambulation;Wheelchair Mobility;Stairs PT Plan PT Intensity: Minimum of 1-2 x/day ,45 to 90 minutes PT Frequency: 5 out of 7 days PT Duration Estimated Length of Stay: 7-10 days.  PT Treatment/Interventions: Ambulation/gait training;Balance/vestibular training;Cognitive remediation/compensation;Discharge planning;Disease management/prevention;DME/adaptive equipment instruction;Functional electrical stimulation;Functional mobility training;Neuromuscular re-education;Splinting/orthotics;Pain management;Patient/family education;Psychosocial support;Skin care/wound management;Stair training;Therapeutic Activities;Therapeutic Exercise;UE/LE Strength taining/ROM;UE/LE Coordination activities;Visual/perceptual remediation/compensation;Wheelchair propulsion/positioning;Community reintegration PT Transfers Anticipated Outcome(s): Supervision A squat pivot.  PT Locomotion Anticipated Outcome(s): WC for community mobility. supervision  for short distances with LRAD.  PT Recommendation Follow Up Recommendations: Home health PT Patient destination: Assisted Living Equipment Recommended: Rolling walker with 5" wheels;Sliding board;Wheelchair (measurements);Wheelchair cushion (measurements)   Skilled Therapeutic Intervention  Session 1: Patient received supine, asleep in bed and aroused easily. PT performed Eval and initiated treatment intervention; see below for results. PT instructed patient in stand pivot transfer x 2 throughout treatment to various seat heights with min cues for AD management and improved use of BUE. Patient able to complete with min A form PT but is noted to have impulsivity with stand to sit at end of transfers. Squat pivot transfer completed as listed below with min A from PT x 4 throughout therapy and mod cues for technique and improved UE positioning. Patient also instructed in car transfer with min- mod A from PT using stand pivot technique and mod cues for AD management. See below for gait training.    Session 2: Patient received asleep in bed, but agreeable to PT. Supine>sit transfer with supervision A and min use of bed rails as well as min cues for improved posture once in sitting position. Squat pivot transfer to [facility] with min A from PT with mod cues for improved UE placement and increased control of hips/head relationship.   WC mobility performed for 1108f with Supervision A and min cues for improved coordination of BUE and obstacle navigaiton.  Following gait training, patient reports need to use restroom and is returned to room with total A for time management.   Patient instructed by PT in toilet transfer with min A and heavy use of grab bars in bathroom. PT was required to adjust clothing prior to toileting and manage brief. Patient performed sit<>stand x 5 throughout bowel movement with min A and heavy use of grab bars for PT to perform perineal hygiene. Following toileting[facility] using grab bars and min A from PT.   Squat pivot performed to bed from [facility] and sit>supine with supervision A from PT.  Patient left supine in bed with call bell within reach.    PT Evaluation Precautions/Restrictions Precautions Precautions: Fall Restrictions Weight Bearing Restrictions: Yes RLE Weight Bearing: Non weight bearing General Chart Reviewed: Yes Additional Pertinent History: T2DM, COPD, schizophrenia, tobacco use, PAD s/p right aorto-bifemoral by pass and right femoral to posterior tibial, left femoral to SFA bypass with right 5th toe amputation 04/25/2015 Family/Caregiver Present: No Vital Signs Pain Pain Assessment Pain Assessment: 0-10 Pain Score: 4  Pain Type: Surgical pain Pain Location: Leg Pain Orientation: Right Pain Descriptors / Indicators: Burning Pain Onset: Gradual Patients Stated Pain Goal: 3 Pain Intervention(s): Medication (See eMAR) Home Living/Prior Functioning Home Living Type of Home: Assisted living Home Access: Ramped entrance Home Layout: One level Bathroom Accessibility: Yes Additional Comments: lives at assited living facility.   Lives With: Other (Comment) Prior Function Level of Independence: Requires assistive device for independence;Needs assistance with homemaking;Needs assistance with ADLs  Able to Take Stairs?: No Driving: No Vision/Perception     Cognition Orientation Level: Oriented X4 Memory: Appears intact Awareness: Appears intact Problem Solving: Impaired Safety/Judgment: Impaired Comments: mild safety defictis with mild impulsivity at Eval.  Sensation Sensation Light Touch: Appears Intact Hot/Cold: Appears Intact Proprioception: Appears Intact Coordination Gross Motor Movements are Fluid and Coordinated: Yes Fine Motor Movements are Fluid and Coordinated: Not tested Motor  Motor Motor: Within Functional Limits Motor - Skilled Clinical Observations: generalized weakness   Mobility Bed  Mobility Bed Mobility: Rolling Right;Rolling Left;Supine to Sit;Sit to Supine Rolling Right: 5: Supervision Rolling Right Details: Verbal cues for technique Rolling Left: 5: Supervision Rolling Left Details: Verbal cues for technique Supine to Sit: 5: Supervision Supine to Sit Details: Verbal cues for technique Sit to Supine: 5: Supervision Sit to Supine - Details: Verbal cues for technique;Verbal cues for precautions/safety Transfers Transfers: Yes Sit to Stand: 4: Min assist Sit to Stand Details: Verbal cues for technique;Verbal cues for precautions/safety;Verbal cues for safe use of DME/AE Stand Pivot Transfers: 4: Min assist Stand Pivot Transfer Details: Verbal cues for sequencing;Verbal cues for technique;Verbal cues for precautions/safety;Tactile cues for placement Squat Pivot Transfers: 4: Min Risk manager Details: Verbal cues for safe use of DME/AE;Verbal cues for technique;Verbal cues for precautions/safety;Tactile cues for placement Locomotion  Ambulation Ambulation: Yes Ambulation/Gait Assistance: 4: Min assist Ambulation Distance (Feet): 6 Feet Assistive device: Rolling walker Ambulation/Gait Assistance Details: Verbal cues for safe use of DME/AE;Verbal cues for technique;Verbal cues for precautions/safety;Verbal cues for gait pattern;Tactile cues for weight shifting Gait Gait: Yes Gait Pattern: Step-to pattern Stairs / Additional Locomotion Stairs: No Wheelchair Mobility Wheelchair Mobility: Yes Wheelchair Assistance: 5: Careers information officer: Both upper extremities Wheelchair Parts Management: Needs assistance Distance: 165f  Trunk/Postural Assessment  Cervical Assessment Cervical Assessment: Within FWater engineerThoracic Assessment: Within Functional Limits Lumbar Assessment Lumbar Assessment: Within Functional Limits Postural Control Postural Control: Deficits on evaluation  Balance Static Sitting  Balance Static Sitting - Balance Support: No upper extremity supported Static Sitting - Level of Assistance: 6: Modified independent (Device/Increase time) Dynamic Sitting Balance Dynamic Sitting - Balance Support: No upper extremity supported Dynamic Sitting - Level of Assistance: 4: Min aInsurance risk surveyorStanding - Balance Support: Bilateral upper extremity supported Static Standing - Level of Assistance: 5: Stand by assistance Dynamic Standing Balance Dynamic Standing - Balance Support: Bilateral upper extremity  supported Dynamic Standing - Level of Assistance: 4: Min assist Extremity Assessment      RLE Assessment RLE Assessment: Exceptions to [facility] RLE AROM (degrees) RLE Overall AROM Comments: lacking ~ 5 degrees full knee extension, WFL hip ROM RLE Strength RLE Overall Strength Comments: 4-/5 hip flexion, abduction and adduction. grossly 3+/5 knee flexion/extension due to pain.  LLE Assessment LLE Assessment: Exceptions to WFL LLE AROM (degrees) LLE Overall AROM Comments: WFL for all motion tested.  LLE Strength LLE Overall Strength Comments: 4/5 for all hip and knee MMT.  mild difficulty assessing full strength due to mild difficulty processing movement for MMT.    See Function Navigator for Current Functional Status.   Refer to Care Plan for Long Term Goals  Recommendations for other services: None  Discharge Criteria: Patient will be discharged from PT if patient refuses treatment 3 consecutive times without medical reason, if treatment goals not met, if there is a change in medical status, if patient makes no progress towards goals or if patient is discharged from hospital.  The above assessment, treatment plan, treatment alternatives and goals were discussed and mutually agreed upon: by patient  Lorie Phenix 08/29/2015, 10:50 AM

## 2015-08-29 NOTE — Progress Notes (Signed)
Social Work Assessment and Plan  Patient Details  Name: Craig Bentley MRN: [id number] Date of Birth: June 16, 1955  Today's Date: 08/29/2015  Problem List:  Patient Active Problem List   Diagnosis Date Noted  . Fever   . SIRS (systemic inflammatory response syndrome) (HCC)   . Leukocytosis   . Status post below knee amputation of right lower extremity (HCC) 08/28/2015  . 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HCC) 08/23/2015  . Sepsis (HCC) 08/21/2015  . ARF (acute renal failure) (HCC) 08/21/2015  . Acute encephalopathy 08/21/2015  . Post-operative state   . Respiratory failure (HCC)   . Surgery, elective   . Anxiety   . Schizophrenia (HCC)   . Atherosclerosis of extremity with gangrene (HCC)   . Hyperglycemia   . Preoperative cardiovascular examination 04/11/2015  . PVD (peripheral vascular disease) (HCC) 04/10/2015  . Absolute anemia   . Diabetic foot infection (HCC) 04/02/2015  . Right foot pain 04/02/2015  . Neutrophilic leukocytosis 04/02/2015  . Esophageal abnormality   . HCAP (healthcare-associated pneumonia)   . COPD exacerbation (HCC)   . Dyspnea   . Respiratory distress   . Sepsis due to pneumonia (HCC) 02/14/2015  . Acute respiratory failure with hypoxia (HCC) 02/13/2015  . Diabetes mellitus with neurologic complication, without long-term current use of insulin (HCC) 07/20/2011  . Essential hypertension   . COPD (chronic obstructive pulmonary disease) (HCC)    Past Medical History:  Past Medical History  Diagnosis Date  . Hypertension   . Diabetes mellitus   . Schizophrenia, schizo-affective (HCC)   . COPD (chronic obstructive pulmonary disease) (HCC)   . Constipation   . Fecal impaction (HCC)   . Pneumonia   . Anxiety    Past Surgical History:  Past Surgical History  Procedure Laterality Date  . Peripheral vascular catheterization N/A 04/07/2015    Procedure: Abdominal Aortogram;  Surgeon: Chuck Hint, MD;  Location: [facility] INVASIVE CV LAB;  Service:  Cardiovascular;  Laterality: N/A;  . Aorta - bilateral femoral artery bypass graft Bilateral 04/23/2015    Procedure: AORTOBIFEMORAL BYPASS GRAFT;  Surgeon: Fransisco Hertz, MD;  Location: [facility] OR;  Service: Vascular;  Laterality: Bilateral;  . Femoral-tibial bypass graft Right 04/23/2015    Procedure: Right Common Femoral to Posterior Tibial Bypass with Composite Vein Graft. ;  Surgeon: Fransisco Hertz, MD;  Location: [facility] OR;  Service: Vascular;  Laterality: Right;  . Amputation Right 04/23/2015    Procedure: Right Fifth Toe Amputation;  Surgeon: Fransisco Hertz, MD;  Location: [facility] OR;  Service: Vascular;  Laterality: Right;  . Femoral-femoral bypass graft Left 04/23/2015    Procedure:  Left Common Femoral Artery to Superficial Femoral Artery Bypass Graft with Vein;  Surgeon: Fransisco Hertz, MD;  Location: [facility] OR;  Service: Vascular;  Laterality: Left;  . Amputation Right 08/27/2015    Procedure: RIGHT BELOW KNEE AMPUTATION ;  Surgeon: Nadara Mustard, MD;  Location: [facility] OR;  Service: Orthopedics;  Laterality: Right;   Social History:  reports that he has been smoking Cigarettes.  He has a 75 pack-year smoking history. He has never used smokeless tobacco. He reports that he does not drink alcohol or use illicit drugs.  Family / Support Systems Marital Status: Single Patient Roles: Other (Comment) (brother) Other Supports: [name] - sister - [phone] - although number is not working on 08-29-15 Anticipated Caregiver: [facility]  [phone] Ability/Limitations of Caregiver: Per Admissions Coordinator, ALF helps with pt's bathing, but not his toileting needs.  CSW attempted to call ALF to see what they can assist pt with and received no answer.  Will continue to reach out to them. Caregiver Availability: Intermittent Family Dynamics: unknown, since sister cannot be reached  Social History Preferred language: English Religion: Non-Denominational Employment Status: Disabled Date  Retired/Disabled/Unemployed: unknown Marine scientist Issues: none reported Guardian/Conservator: MD has determined that pt is capable of making his own decisions.  It is unknown at the time of this writing if he has anyone who assists with his finances.   Abuse/Neglect Physical Abuse: Denies Verbal Abuse: Denies Sexual Abuse: Denies Exploitation of patient/patient's resources: Denies Self-Neglect: Denies  Emotional Status Pt's affect, behavior and adjustment status: Pt reports feeling okay emotionally with new amputation.  CSW to continue to assess for this, as pt was sleepy at the time of visit. Recent Psychosocial Issues: none reported, besides current hospitalization Psychiatric History: Pt with schizophrenia/schizo-affective disorder.  He reports having some anxiety in the hospital and due to amputation, but overall prior to this hospitalizaiton, he feels his medications are working. Substance Abuse History: none reported  Patient / Family Perceptions, Expectations & Goals Pt/Family understanding of illness & functional limitations: Pt reports an understanding of his condition, but due to his being in and out of sleep, CSW will continue to assess this with pt. Premorbid pt/family roles/activities: Pt has resided at the ALF for 25 years.  Not sure of his hobbies/activities - CSW to f/u on this later. Anticipated changes in roles/activities/participation: Pt to resume activities/participation as he is able to. Pt/family expectations/goals: Pt was too tired to put this into words, but expressed understanding and appreciation of being on Rehab.  Community [employer] Agencies: None Premorbid Home Care/DME Agencies: Other (Comment) (Pt lives at [facility] and was having PT at facility.) Transportation available at discharge: ALF?  CSW to confirm this once pt can talk with ALF. Resource referrals recommended: Neuropsychology, Support group (specify)  Discharge  Planning Living Arrangements: Non-relatives/Friends Support Systems: Other relatives (sister) Type of Residence: Assisted living Insurance Resources: Medicaid (specify county) Medical sales representative) Financial Resources: SSD Financial Screen Referred: No Money Management: Patient Does the patient have any problems obtaining your medications?: No Home Management: none - pt lives at ALF Patient/Family Preliminary Plans: Pt plans to return to ALF at d/c, pending they can provide the care pt will need with new amputation. Barriers to Discharge: Self care Social Work Anticipated Follow Up Needs: HH/OP, ALF/IL, Support Group Expected length of stay: 12-15 days  Clinical Impression CSW met with pt to introduce self and role of CSW, as well as to complete assessment.  Pt was very sleepy during visit and would wake to answer some questions before drifting back off to sleep.  Pt gave CSW permission to call his sister and ALF, but sister's number was not in working order and ALF did not answer the telephone despite multiple lengthy attempts.  CSW will continue to try to reach both again at another time.  Pt told CSW that he's been at [facility] for 25 years and that he thinks they can meet his needs at d/c.  CSW will still need to confirm this with them.  Pt acknowledged that he was here for rehab after an amputation and that he understood that CSW would follow him while he's on Rehab.  Pt reported being in pretty good spirits post amputation, but admitted to some anxiety while he's been in the hospital.  CSW will refer pt for neuropsychology for coping and will  continue to follow and assist as needed, following up with ALF and sister as well.  Trevonte Ashkar, [place] 08/29/2015, 11:52 AM

## 2015-08-29 NOTE — IPOC Note (Signed)
Overall Plan of Care [facility]) Patient Details Name: Craig Bentley MRN: [id number] DOB: 30-May-1955  Admitting Diagnosis: r bka  Hospital Problems: Principal Problem:   Status post below knee amputation of right lower extremity ([place]) Active Problems:   Essential hypertension   COPD (chronic obstructive pulmonary disease) (HCC)   Diabetes mellitus with neurologic complication, without long-term current use of insulin (HCC)   ARF (acute renal failure) (HCC)   Fever   SIRS (systemic inflammatory response syndrome) (HCC)   Leukocytosis     Functional Problem List: Nursing Bladder, Bowel, Endurance, Medication Management, Perception, Pain, Safety, Sensory, Skin Integrity  PT Endurance, Motor, Pain, Safety, Skin Integrity  OT Balance, Cognition, Endurance, Motor, Pain, Safety  SLP    TR         Basic ADL's: OT Bathing, Dressing, Toileting     Advanced  ADL's: OT       Transfers: PT Bed Mobility, Car, Bed to Chair, Floor, [employer]  OT Toilet     Locomotion: PT Ambulation, Emergency planning/management officer, Stairs     Additional Impairments: OT None  SLP        TR      Anticipated Outcomes Item Anticipated Outcome  Self Feeding I  Swallowing      Basic self-care  mod I w/c level or bed level  Toileting  mod I    Bathroom Transfers supervision to toilet  Bowel/Bladder  Mod I  Transfers  Supervision A squat pivot.   Locomotion  WC for community mobility. supervision for short distances with LRAD.   Communication     Cognition     Pain  3 or less  Safety/Judgment  Min assist   Therapy Plan: PT Intensity: Minimum of 1-2 x/day ,45 to 90 minutes PT Frequency: 5 out of 7 days PT Duration Estimated Length of Stay: 7-10 days.  OT Intensity: Minimum of 1-2 x/day, 45 to 90 minutes OT Frequency: 5 out of 7 days OT Duration/Estimated Length of Stay: 10-12 days         Team Interventions: Nursing Interventions Patient/Family Education, Medication Management, Bladder  Management, Bowel Management, Disease Management/Prevention, Pain Management, Discharge Planning, Skin Care/Wound Management  PT interventions Ambulation/gait training, Balance/vestibular training, Cognitive remediation/compensation, Discharge planning, Disease management/prevention, DME/adaptive equipment instruction, Functional electrical stimulation, Functional mobility training, Neuromuscular re-education, Splinting/orthotics, Pain management, Patient/family education, Psychosocial support, Skin care/wound management, Stair training, Therapeutic Activities, Therapeutic Exercise, UE/LE Strength taining/ROM, UE/LE Coordination activities, Visual/perceptual remediation/compensation, Wheelchair propulsion/positioning, Community reintegration  OT Interventions Training and development officer, Engineer, drilling, Cognitive remediation/compensation, Functional mobility training, Patient/family education, Self Care/advanced ADL retraining, Therapeutic Exercise, UE/LE Strength taining/ROM, Therapeutic Activities  SLP Interventions    TR Interventions    SW/CM Interventions Discharge Planning, Psychosocial Support, Patient/Family Education    Team Discharge Planning: Destination: PT-Assisted Living ,OT- Assisted Living , SLP-  Projected Follow-up: PT-Home health PT, OT-  Other (comment) (to be determined), SLP-  Projected Equipment Needs: PT-Rolling walker with 5" wheels, Sliding board, Wheelchair (measurements), Wheelchair cushion (measurements), OT- To be determined, SLP-  Equipment Details: PT- , OT-  Patient/family involved in discharge planning: PT- Patient,  OT-Patient, SLP-   MD ELOS: 8-12 days. Medical Rehab Prognosis:  Good Assessment:  60 y.o. male with history of T2DM, COPD, schizophrenia, tobacco use, PAD s/p right aorto-bifemoral by pass and right femoral to posterior tibial, left femoral to SFA bypass with right 5th toe amputation 04/25/2015 with dehiscence of wound and gangrenous  changes. He was admitted on 08/21/15 with lethargy and hypotension felt to  be related to fentanyl--improved with narcan. He was started on IVF and IV antibiotics due to sepsis and acute renal failure. Right foot not felt to be salvageable per Dr. Bridgett Larsson and Dr. Sharol Given. He underwent R-BKA on 08/27/15 by Dr. Sharol Given. Pt with resulting functional deficits in gait, of weakness, and endurance.  Will set goals for Mod I at wheelchair level with therapies.   See Team Conference Notes for weekly updates to the plan of care

## 2015-08-29 NOTE — Progress Notes (Signed)
[facility]ICAL [facility]ICINE & REHABILITATION     PROGRESS NOTE  Subjective/Complaints:  Pt laying in bed this AM.  He states he slept well overnight.  He appears slightly confused.    ROS: Denies CP, SOB, N/V/D.  Objective: Vital Signs: Blood pressure 125/58, pulse 88, temperature 100.5 F (38.1 C), temperature source Oral, resp. rate 24, height '5\' 11"'$  (1.803 m), weight 70.716 kg (155 lb 14.4 oz), SpO2 92 %. No results found.  Recent Labs  08/28/15 1037 08/29/15 0544  WBC 13.9*  13.2* 12.9*  HGB 9.6*  9.6* 9.2*  HCT 31.5*  31.7* 29.8*  PLT 383  360 358    Recent Labs  08/28/15 1037 08/29/15 0544  NA 138 139  K 4.0 4.0  CL 104 103  GLUCOSE 153* 115*  BUN 9 9  CREATININE 0.61 0.55*  CALCIUM 9.1 9.2   CBG (last 3)   Recent Labs  08/28/15 2117 08/29/15 0442 08/29/15 0708  GLUCAP 153* 127* 125*    Wt Readings from Last 3 Encounters:  08/29/15 70.716 kg (155 lb 14.4 oz)  08/28/15 69.6 kg (153 lb 7 oz)  05/16/15 78.926 kg (174 lb)    Physical Exam:  BP 125/58 mmHg  Pulse 88  Temp(Src) 100.5 F (38.1 C) (Oral)  Resp 24  Ht '5\' 11"'$  (1.803 m)  Wt 70.716 kg (155 lb 14.4 oz)  BMI 21.75 kg/m2  SpO2 92% Constitutional: He appears well-developed and well-nourished.  HENT: Normocephalic and atraumatic.  Eyes: Conjunctivae and EOM are normal. No scleral icterus.  Cardiovascular: Normal rate and regular rhythm.  Respiratory: Effort normal and breath sounds normal. No stridor. He exhibits no tenderness.  GI: Soft. Bowel sounds are normal. He exhibits no distension. There is no tenderness.  Musculoskeletal: He exhibits no edema. R-BKA with VAC in place.  Neurological: He is alert and oriented x2.  Able to follow basic commands without difficulty.  Motor: 4/5 throughout B/l and LLE: RLE 3/5 HF Skin: Skin is warm and dry. VAC in place Psychiatric: He has a normal mood and affect. His behavior is normal.   Assessment/Plan: 1. Functional deficits secondary  to PAD, s/p right BKA which require 3+ hours per day of interdisciplinary therapy in a comprehensive inpatient rehab setting. Physiatrist is providing close team supervision and 24 hour management of active medical problems listed below. Physiatrist and rehab team continue to assess barriers to discharge/monitor patient progress toward functional and medical goals.  Function:  Bathing Bathing position      Bathing parts      Bathing assist        Upper Body Dressing/Undressing Upper body dressing                    Upper body assist        Lower Body Dressing/Undressing Lower body dressing                                  Lower body assist        Toileting Toileting          Toileting assist     Transfers Chair/bed transfer             Locomotion Ambulation           Wheelchair          Cognition Comprehension Comprehension assist level: Follows basic conversation/direction with no assist  Expression Expression assist  level: Expresses basic needs/ideas: With extra time/assistive device  Social Interaction Social Interaction assist level: Interacts appropriately with others with medication or extra time (anti-anxiety, antidepressant).  Problem Solving Problem solving assist level: Solves basic problems with no assist  Memory Memory assist level: Recognizes or recalls 90% of the time/requires cueing < 10% of the time    Medical Problem List and Plan: 1. Functional and mobility deficits secondary to PAD, s/p right BKA on 7/5 Begin CIR  2. DVT Prophylaxis/Anticoagulation: lovenox '40mg'$  sq daily 3. Pain Management: pt reports good pain control at present.  tramadol/oxycodone '5mg'$  q4-6 prn.  robaxin '500mg'$  prn muscle spasms.  gabapentin for peripheral neuropathic pain 4. Mood:  -haldol, aricept, clozapine 5. Neuropsych: This patient is capable of making decisions on his  own behalf. 6. Skin/Wound Care: Vac to wound approx 7 days (~7/12), shrinker 7. Fluids/Electrolytes/Nutrition: encourage po, check admission labs 8. Acute Renal Failure:  Resolved: Cr 0.55 on 7/7 9. 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 Essential HTN - metoprolol '25mg'$  bid 11. DM2---diet controlled.  Cont to monitor, will consider medications if necessary  check cbg's AC and HS. SSI as appropriate 12. Acute encephalopathy--?resolved 13. Fever  With leukocytosis  Will order UA/Ucx, CXR  Repeat labs tomorrow  LOS (Days) 1 A FACE TO FACE EVALUATION WAS PERFORMED  Craig Bentley 08/29/2015 11:33 AM

## 2015-08-29 NOTE — Evaluation (Signed)
Occupational Therapy Assessment and Plan  Patient Details  Name: Craig Bentley MRN: [id number] Date of Birth: 12-Oct-1955  OT Diagnosis: acute pain, cognitive deficits and muscle weakness (generalized) Rehab Potential: Rehab Potential (ACUTE ONLY): Good ELOS: 10-12 days   Today's Date: 08/29/2015 OT Individual Time: 3536-1443 OT Individual Time Calculation (min): 65 min  and Today's Date: 08/29/2015 OT Missed Time: 10 Minutes Missed Time Reason: Patient fatigue    Problem List:  Patient Active Problem List   Diagnosis Date Noted  . Fever   . SIRS (systemic inflammatory response syndrome) (HCC)   . Leukocytosis   . Status post below knee amputation of right lower extremity ([place]) 08/28/2015  . 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[place]) 08/23/2015  . Sepsis ([place]) 08/21/2015  . ARF (acute renal failure) ([place]) 08/21/2015  . Acute encephalopathy 08/21/2015  . Post-operative state   . Respiratory failure ([place])   . Surgery, elective   . Anxiety   . Schizophrenia ([place])   . Atherosclerosis of extremity with gangrene ([place])   . Hyperglycemia   . Preoperative cardiovascular examination 04/11/2015  . PVD (peripheral vascular disease) ([place]) 04/10/2015  . Absolute anemia   . Diabetic foot infection ([place]) 04/02/2015  . Right foot pain 04/02/2015  . Neutrophilic leukocytosis 15/40/0867  . Esophageal abnormality   . HCAP (healthcare-associated pneumonia)   . COPD exacerbation ([place])   . Dyspnea   . Respiratory distress   . Sepsis due to pneumonia ([place]) 02/14/2015  . Acute respiratory failure with hypoxia ([place]) 02/13/2015  . Diabetes mellitus with neurologic complication, without long-term current use of insulin ([place]) 07/20/2011  . Essential hypertension   . COPD (chronic obstructive pulmonary disease) (HCC)     Past Medical History:  Past Medical History  Diagnosis Date  . Hypertension   . Diabetes mellitus   . Schizophrenia, schizo-affective ([place])   . COPD (chronic obstructive pulmonary disease)  ([place])   . Constipation   . Fecal impaction ([place])   . Pneumonia   . Anxiety    Past Surgical History:  Past Surgical History  Procedure Laterality Date  . Peripheral vascular catheterization N/A 04/07/2015    Procedure: Abdominal Aortogram;  Surgeon: Angelia Mould, MD;  Location: [facility];  Service: Cardiovascular;  Laterality: N/A;  . Aorta - bilateral femoral artery bypass graft Bilateral 04/23/2015    Procedure: AORTOBIFEMORAL BYPASS GRAFT;  Surgeon: Conrad Oyens, MD;  Location: [facility];  Service: Vascular;  Laterality: Bilateral;  . Femoral-tibial bypass graft Right 04/23/2015    Procedure: Right Common Femoral to Posterior Tibial Bypass with Composite Vein Graft. ;  Surgeon: Conrad Tamaroa, MD;  Location: [facility];  Service: Vascular;  Laterality: Right;  . Amputation Right 04/23/2015    Procedure: Right Fifth Toe Amputation;  Surgeon: Conrad Narrowsburg, MD;  Location: [facility];  Service: Vascular;  Laterality: Right;  . Femoral-femoral bypass graft Left 04/23/2015    Procedure:  Left Common Femoral Artery to Superficial Femoral Artery Bypass Graft with Vein;  Surgeon: Conrad Yukon, MD;  Location: [facility];  Service: Vascular;  Laterality: Left;  . Amputation Right 08/27/2015    Procedure: RIGHT BELOW KNEE AMPUTATION ;  Surgeon: Newt Minion, MD;  Location: [facility];  Service: Orthopedics;  Laterality: Right;    Assessment & Plan Clinical Impression:  Craig Bentley is a 60 y.o. male with history of T2DM, COPD, schizophrenia, tobacco use, PAD s/p right aorto-bifemoral by pass and right femoral to posterior tibial, left femoral to SFA bypass with  right 5th toe amputation 04/25/2015 with dehiscence of wound and gangrenous changes. He was admitted on 08/21/15 with lethargy and hypotension felt to be related to fentanyl--improved with narcan. He was started on IVF and IV antibiotics due to sepsis and acute renal failure. Right foot not felt to be salvageable per Dr. Bridgett Larsson and Dr. Sharol Given. He underwent L-BKA on  08/27/15 by Dr. Sharol Given. Therapy evaluations done today and patient felt to be good CIR candidate.    Patient transferred to CIR on 08/28/2015 .    Patient currently requires min with basic self-care skills secondary to muscle weakness, decreased cardiorespiratoy endurance, decreased problem solving and decreased safety awareness and decreased standing balance and decreased balance strategies.  Prior to hospitalization, patient could complete basis ADLs with supervision to mod I.  Patient will benefit from skilled intervention to increase independence with basic self-care skills prior to discharge to ALF.  Anticipate patient will require intermittent supervision and further OT services to be determined..  OT - End of Session Activity Tolerance: Tolerates 10 - 20 min activity with multiple rests Endurance Deficit: Yes OT Assessment Rehab Potential (ACUTE ONLY): Good Barriers to Discharge: Decreased caregiver support Barriers to Discharge Comments: pt will not have Elias-Fela Solis OT Patient demonstrates impairments in the following area(s): Balance;Cognition;Endurance;Motor;Pain;Safety OT Basic ADL's Functional Problem(s): Bathing;Dressing;Toileting OT Transfers Functional Problem(s): Toilet OT Additional Impairment(s): None OT Plan OT Intensity: Minimum of 1-2 x/day, 45 to 90 minutes OT Frequency: 5 out of 7 days OT Duration/Estimated Length of Stay: 10-12 days OT Treatment/Interventions: Balance/vestibular training;DME/adaptive equipment instruction;Cognitive remediation/compensation;Functional mobility training;Patient/family education;Self Care/advanced ADL retraining;Therapeutic Exercise;UE/LE Strength taining/ROM;Therapeutic Activities OT Self Feeding Anticipated Outcome(s): I OT Basic Self-Care Anticipated Outcome(s): mod I w/c level or bed level OT Toileting Anticipated Outcome(s): mod I  OT Bathroom Transfers Anticipated Outcome(s): supervision to toilet OT Recommendation Patient destination:  Assisted Living Follow Up Recommendations: Other (comment) (to be determined) Equipment Recommended: To be determined   Skilled Therapeutic Intervention Pt seen for initial evaluation, explanation of OT, review of pt's goals and basic ADL retraining.  Pt very fatigued, dozing off frequently. He continually requested to go back to bed. Pt's session cut short by 10 min to allow pt to go back to bed to rest. Pt worked on transfers, sit to stand skills and balance.  Pt in bed with bed alarm set and all needs met.  OT Evaluation Precautions/Restrictions  Precautions Precautions: Fall Restrictions RLE Weight Bearing: Non weight bearing General OT Amount of Missed Time: 10 Minutes   Pain Pain Assessment Pain Assessment: No/denies pain Home Living/Prior Functioning Home Living Living Arrangements: Non-relatives/Friends Type of Home: Assisted living Home Access: Ramped entrance Home Layout: One level Bathroom Shower/Tub: Other (comment) (pt would perform sponge bath at sink) Bathroom Accessibility: Yes Additional Comments: lives at assited living facility.   Lives With: Other (Comment) Prior Function Level of Independence: Requires assistive device for independence, Needs assistance with homemaking, Needs assistance with ADLs  Able to Take Stairs?: No Driving: No ADL ADL ADL Comments: Refer to functional navigator Vision/Perception  Vision- History Baseline Vision/History: No visual deficits Patient Visual Report: No change from baseline Vision- Assessment Vision Assessment?: No apparent visual deficits  Cognition Arousal/Alertness: Lethargic Orientation Level: Person;Place;Situation Person: Oriented Place: Oriented Situation: Oriented Year: 2017 Month: July Day of Week: Correct Memory: Appears intact Immediate Memory Recall: Sock;Blue;Bed Memory Recall: Sock;Blue;Bed Memory Recall Sock: Without Cue Memory Recall Blue: Without Cue Memory Recall Bed: Without  Cue Awareness: Appears intact Problem Solving: Impaired Problem Solving  Impairment: Verbal basic;Functional basic Safety/Judgment: Impaired Comments: mild safety defictis with mild impulsivity at Eval.  Sensation Sensation Light Touch: Appears Intact Stereognosis: Appears Intact Hot/Cold: Appears Intact Proprioception: Appears Intact Coordination Gross Motor Movements are Fluid and Coordinated: Yes Fine Motor Movements are Fluid and Coordinated: Yes (slow, but functional) Finger Nose Finger Test: 8x in 10 sec for BUE Motor  Motor Motor: Within Functional Limits Motor - Skilled Clinical Observations: generalized weakness  Mobility  Bed Mobility Bed Mobility: Rolling Right;Rolling Left;Supine to Sit;Sit to Supine Rolling Right: 5: Supervision Rolling Right Details: Verbal cues for technique Rolling Left: 5: Supervision Rolling Left Details: Verbal cues for technique Supine to Sit: 5: Supervision Supine to Sit Details: Verbal cues for technique Sit to Supine: 5: Supervision Sit to Supine - Details: Verbal cues for technique;Verbal cues for precautions/safety Transfers Sit to Stand: 4: Min assist Sit to Stand Details: Verbal cues for technique;Verbal cues for precautions/safety;Verbal cues for safe use of DME/AE  Trunk/Postural Assessment  Cervical Assessment Cervical Assessment: Within Functional Limits Thoracic Assessment Thoracic Assessment: Within Functional Limits Lumbar Assessment Lumbar Assessment: Within Functional Limits Postural Control Postural Control: Deficits on evaluation Protective Responses: delayed  Balance Static Sitting Balance Static Sitting - Balance Support: No upper extremity supported Static Sitting - Level of Assistance: 6: Modified independent (Device/Increase time) Dynamic Sitting Balance Dynamic Sitting - Balance Support: No upper extremity supported Dynamic Sitting - Level of Assistance: 4: Min Insurance risk surveyor  Standing - Balance Support: Bilateral upper extremity supported Static Standing - Level of Assistance: 5: Stand by assistance Static Standing - Comment/# of Minutes: less than 30 seconds Dynamic Standing Balance Dynamic Standing - Balance Support: Bilateral upper extremity supported Dynamic Standing - Level of Assistance: 4: Min assist Extremity/Trunk Assessment RUE Assessment RUE Assessment: Exceptions to [facility] RUE AROM (degrees) Right Shoulder Flexion: 90 Degrees RUE Strength RUE Overall Strength Comments: PROM WFL, shoulder 3-/5, distally 4-/5 LUE Assessment LUE Assessment: Exceptions to [facility] LUE AROM (degrees) Left Shoulder Flexion: 90 Degrees LUE Strength LUE Overall Strength Comments: PROM WFL, shoulder 3-/5, distally 4-/5   See Function Navigator for Current Functional Status.   Refer to Care Plan for Long Term Goals  Recommendations for other services: None  Discharge Criteria: Patient will be discharged from OT if patient refuses treatment 3 consecutive times without medical reason, if treatment goals not met, if there is a change in medical status, if patient makes no progress towards goals or if patient is discharged from hospital.  The above assessment, treatment plan, treatment alternatives and goals were discussed and mutually agreed upon: by patient  [facility] 08/29/2015, 12:49 PM

## 2015-08-29 NOTE — Progress Notes (Signed)
Retta Diones, RN Rehab Admission Coordinator Signed Physical Medicine and Rehabilitation PMR Pre-admission 08/28/2015 2:09 PM  Related encounter: ED to Hosp-Admission (Discharged) from 08/21/2015 in [place] Collapse All   PMR Admission Coordinator Pre-Admission Assessment  Patient: Craig Bentley is an 60 y.o., male MRN: [id number] DOB: 22-Apr-1955 Height: 5' 11.5" (181.6 cm) Weight: 69.6 kg (153 lb 7 oz)  Insurance Information HMO: PPO: PCP: IPA: 80/20: OTHER:  PRIMARY: Medicaid [place] Access Policy#: [id number] o Subscriber: Janora Norlander CM Name: Phone#: Fax#:  Pre-Cert#: Employer: Disabled Benefits: Phone #: [phone] Name: Automated Eff. Date: Eligible on 08/28/15 with coverage code [id number] Deduct: Out of Pocket Max: Life Max:  CIR: SNF:  Outpatient: Co-Pay:  Home Health: Co-Pay:  DME: Co-Pay:  Providers:   Emergency Contact Information Contact Information    Name Relation Home Work Mobile   [place] Sister [phone]       Current Medical History  Patient Admitting Diagnosis: R BKA  History of Present Illness: A 60 y.o. 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 He was admitted on 08/21/15 with lethargy and hypotension felt to be related to fentanyl--improved with narcan. He was started on IVF and IV antibiotics due to sepsis and acute renal failure. Right foot not felt to be salvageable per Dr. Bridgett Larsson and Dr. Sharol Given. He underwent L-BKA on 08/27/15 by  Dr. Sharol Given. Therapy evaluations done today and patient felt to be good CIR candidate.   Past Medical History  Past Medical History  Diagnosis Date  . Hypertension   . Diabetes mellitus   . Schizophrenia, schizo-affective ([place])   . COPD (chronic obstructive pulmonary disease) ([place])   . Constipation   . Fecal impaction ([place])   . Pneumonia   . Anxiety     Family History  family history includes Mental illness in his mother.  Prior Rehab/Hospitalizations: Was receiving HHPT at [facility]  Has the patient had major surgery during 100 days prior to admission? No  Current Medications   Current facility-administered medications:  . 0.9 % sodium chloride infusion, , Intravenous, Continuous, Newt Minion, MD . acetaminophen (TYLENOL) tablet 650 mg, 650 mg, Oral, Q6H PRN, 650 mg at 08/26/15 1831 **OR** acetaminophen (TYLENOL) suppository 650 mg, 650 mg, Rectal, Q6H PRN, Rise Patience, MD . acetaminophen (TYLENOL) tablet 650 mg, 650 mg, Oral, Q6H PRN **OR** acetaminophen (TYLENOL) suppository 650 mg, 650 mg, Rectal, Q6H PRN, Newt Minion, MD . Chlorhexidine Gluconate Cloth 2 % PADS 6 each, 6 each, Topical, Q0600, Annita Brod, MD, 6 each at 08/27/15 0600 . cloZAPine (CLOZARIL) tablet 100 mg, 100 mg, Oral, 2 times per day, Rise Patience, MD, 100 mg at 08/28/15 0847 . cloZAPine (CLOZARIL) tablet 400 mg, 400 mg, Oral, QHS, Rise Patience, MD, 400 mg at 08/27/15 2138 . dicyclomine (BENTYL) tablet 20 mg, 20 mg, Oral, BID PRN, Rise Patience, MD . donepezil (ARICEPT) tablet 5 mg, 5 mg, Oral, QHS, Rise Patience, MD, 5 mg at 08/27/15 2138 . doxycycline (VIBRA-TABS) tablet 100 mg, 100 mg, Oral, Q12H, Ripudeep K Rai, MD, 100 mg at 08/28/15 1242 . gabapentin (NEURONTIN) capsule 300 mg, 300 mg, Oral, TID, Karmen Bongo, MD, 300 mg at 08/28/15 0846 . guaiFENesin (ROBITUSSIN) 100 MG/5ML solution 200 mg, 10 mL, Oral,  Q6H PRN,  Rise Patience, MD . haloperidol (HALDOL) tablet 5 mg, 5 mg, Oral, QHS, Rise Patience, MD, 5 mg at 08/27/15 2139 . heparin injection 5,000 Units, 5,000 Units, Subcutaneous, Q8H, Ripudeep K Rai, MD . hydrALAZINE (APRESOLINE) injection 10 mg, 10 mg, Intravenous, Q4H PRN, Rise Patience, MD . HYDROmorphone (DILAUDID) injection 1 mg, 1 mg, Intravenous, Q2H PRN, Newt Minion, MD . insulin aspart (novoLOG) injection 0-9 Units, 0-9 Units, Subcutaneous, TID WC, Rise Patience, MD, 2 Units at 08/28/15 1242 . ipratropium-albuterol (DUONEB) 0.5-2.5 (3) MG/3ML nebulizer solution 3 mL, 3 mL, Nebulization, Q4H PRN, Karmen Bongo, MD . lactated ringers infusion, , Intravenous, Continuous, Oleta Mouse, MD, Stopped at 08/27/15 1217 . lactulose (CHRONULAC) 10 GM/15ML solution 30 g, 30 g, Oral, BID, Rise Patience, MD, 30 g at 08/28/15 0846 . methocarbamol (ROBAXIN) tablet 500 mg, 500 mg, Oral, Q6H PRN **OR** methocarbamol (ROBAXIN) 500 mg in dextrose 5 % 50 mL IVPB, 500 mg, Intravenous, Q6H PRN, Newt Minion, MD . metoCLOPramide (REGLAN) tablet 5-10 mg, 5-10 mg, Oral, Q8H PRN **OR** metoCLOPramide (REGLAN) injection 5-10 mg, 5-10 mg, Intravenous, Q8H PRN, Newt Minion, MD . metoprolol tartrate (LOPRESSOR) tablet 25 mg, 25 mg, Oral, BID, Rise Patience, MD, 25 mg at 08/28/15 0846 . mupirocin ointment (BACTROBAN) 2 % 1 application, 1 application, Nasal, BID, Annita Brod, MD, 1 application at 64/68/03 0847 . ondansetron (ZOFRAN) tablet 4 mg, 4 mg, Oral, Q6H PRN **OR** ondansetron (ZOFRAN) injection 4 mg, 4 mg, Intravenous, Q6H PRN, Rise Patience, MD . ondansetron (ZOFRAN) tablet 4 mg, 4 mg, Oral, Q6H PRN **OR** ondansetron (ZOFRAN) injection 4 mg, 4 mg, Intravenous, Q6H PRN, Newt Minion, MD . oxyCODONE (Oxy IR/ROXICODONE) immediate release tablet 5-10 mg, 5-10 mg, Oral, Q3H PRN, Newt Minion, MD, 5 mg at 08/27/15 2145 . oxyCODONE-acetaminophen  (PERCOCET/ROXICET) 5-325 MG per tablet 1 tablet, 1 tablet, Oral, Q6H PRN, Gardiner Barefoot, NP, 1 tablet at 08/27/15 0529 . pantoprazole (PROTONIX) EC tablet 40 mg, 40 mg, Oral, Daily, Rise Patience, MD, 40 mg at 08/28/15 0846 . simvastatin (ZOCOR) tablet 5 mg, 5 mg, Oral, QHS, Rise Patience, MD, 5 mg at 08/27/15 2138 . traMADol (ULTRAM) tablet 50 mg, 50 mg, Oral, Q6H PRN, Gardiner Barefoot, NP . trihexyphenidyl (ARTANE) tablet 5 mg, 5 mg, Oral, BID WC, Rise Patience, MD, 5 mg at 08/28/15 0847  Patients Current Diet: Diet Carb Modified Fluid consistency:: Thin; Room service appropriate?: Yes Diet Carb Modified  Precautions / Restrictions Precautions Precautions: Fall Restrictions Weight Bearing Restrictions: No   Has the patient had 2 or more falls or a fall with injury in the past year?No  Prior Activity Level Household: Has lived at [facility] [place] for 25 years  Development worker, international aid / [place] Devices/Equipment: Radio producer (specify quad or straight) (straight) Home Equipment: Walker - 4 wheels, Shower seat  Prior Device Use: Indicate devices/aids used by the patient prior to current illness, exacerbation or injury? Walker and [employer]  Prior Functional Level Prior Function Level of Independence: Needs assistance Gait / Transfers Assistance Needed: uses a walker with a seat ADL's / Homemaking Assistance Needed: reports he can dress himself and has assistance for showering  Self Care: Did the patient need help bathing, dressing, using the toilet or eating? Needed some help  Indoor Mobility: Did the patient need assistance with walking from room to room (with or without device)? Independent  Stairs: Did the patient need  assistance with internal or external stairs (with or without device)? Needed some help  Functional Cognition: Did the patient need help planning regular tasks such as shopping or remembering to take medications? Needed some  help  Current Functional Level Cognition  Overall Cognitive Status: Within Functional Limits for tasks assessed Orientation Level: Oriented X4   Extremity Assessment (includes Sensation/Coordination)  Upper Extremity Assessment: Overall WFL for tasks assessed  Lower Extremity Assessment: RLE deficits/detail, LLE deficits/detail RLE Deficits / Details: weak but moves in all planes and against gravity with minimal resistance LLE Deficits / Details: weak, but functional to stand    ADLs       Mobility  Overal bed mobility: Needs Assistance Bed Mobility: Supine to Sit, Sit to Supine Supine to sit: Min assist Sit to supine: Min guard General bed mobility comments: stability assist and cues for technique    Transfers  Overall transfer level: Needs assistance Equipment used: Rolling walker (2 wheeled) Transfers: Sit to/from Stand Sit to Stand: Min assist General transfer comment: stability assist and cues for safe hand placement    Ambulation / Gait / Stairs / Wheelchair Mobility  Ambulation/Gait General Gait Details: pt deferred    Posture / Balance Dynamic Sitting Balance Sitting balance - Comments: accepted moderate challenge without UE's Balance Overall balance assessment: Needs assistance Sitting balance-Leahy Scale: Good Sitting balance - Comments: accepted moderate challenge without UE's Standing balance support: Bilateral upper extremity supported Standing balance-Leahy Scale: Poor Standing balance comment: reliant on UE's    Special needs/care consideration BiPAP/CPAP No CPM No Continuous Drip IV KVO Dialysis No  Life Vest No Oxygen No Special Bed No Trach Size No Wound Vac (area) Yes, right BKA stump area  Skin Has a new right BKA incision  Bowel mgmt: Last BM 08/25/2015 Bladder mgmt: Voiding WDL Diabetic mgmt: Yes     Previous [place] Name: [place]: Yes Type of Home Care Services: Home PT (pt states that PT sees him in the Asisted Living Facility) Additional Comments: ramp to enter  Discharge Living Setting Plans for Discharge Living Setting: Other (Comment) (Resides at [facility] ALF) Type of Home at Discharge: [place] Name at Discharge: [facility] Discharge Home Access: Ramped entrance Does the patient have any problems obtaining your medications?: No  Social/Family/Support Systems Anticipated Caregiver: ALF  Ability/Limitations of Caregiver: ALF helps with bathing, but does not help with toileting needs Caregiver Availability: Intermittent Discharge Plan Discussed with Primary Caregiver: No (No answer from ALF at [phone]) Does Caregiver/Family have Issues with Lodging/Transportation while Pt is in Rehab?: No  Goals/Additional Needs Patient/Family Goal for Rehab: PT mod I and supervision, OT mod I to min assist goals Expected length of stay: 12-15 days Cultural Considerations: None Dietary Needs: Carb mod, med calorie, thin liquids Equipment Needs: TBD Pt/Family Agrees to Admission and willing to participate: Yes Program Orientation Provided & Reviewed with Pt/Caregiver Including Roles & Responsibilities: Yes  Decrease burden of Care through IP rehab admission: N/A  Possible need for SNF placement upon discharge: Not planned  Patient Condition: This patient's condition remains as documented in the consult dated 08/28/15, in which the Rehabilitation Physician determined and documented that the patient's condition is appropriate for intensive rehabilitative care in an inpatient rehabilitation facility pending ALF ability to provide some supervision. [facility] assists with bathing, but not with toileting needs. These areas have been addressed. I did discuss the above with Dr. Naaman Plummer. Will admit to inpatient rehab  today.  Preadmission Screen Completed By: Retta Diones, 08/28/2015  2:23 PM ______________________________________________________________________  Discussed status with Dr. Naaman Plummer on 08/28/15 at 1423 and received telephone approval for admission today.  Admission Coordinator: Retta Diones, time1423/Date07/06/17          Cosigned by: Meredith Staggers, MD at 08/28/2015 2:50 PM  Revision History     Date/Time User Provider Type Action   08/28/2015 2:50 PM Meredith Staggers, MD Physician Cosign   08/28/2015 2:24 PM Retta Diones, RN Rehab Admission Coordinator Sign

## 2015-08-30 ENCOUNTER — Inpatient Hospital Stay (HOSPITAL_COMMUNITY): Payer: Medicaid Other | Admitting: Occupational Therapy

## 2015-08-30 ENCOUNTER — Inpatient Hospital Stay (HOSPITAL_COMMUNITY): Payer: Self-pay | Admitting: Physical Therapy

## 2015-08-30 DIAGNOSIS — D62 Acute posthemorrhagic anemia: Secondary | ICD-10-CM | POA: Insufficient documentation

## 2015-08-30 LAB — BASIC METABOLIC PANEL
ANION GAP: 7 (ref 5–15)
BUN: 9 mg/dL (ref 6–20)
CALCIUM: 9.2 mg/dL (ref 8.9–10.3)
CO2: 31 mmol/L (ref 22–32)
Chloride: 101 mmol/L (ref 101–111)
Creatinine, Ser: 0.63 mg/dL (ref 0.61–1.24)
Glucose, Bld: 126 mg/dL — ABNORMAL HIGH (ref 65–99)
POTASSIUM: 3.9 mmol/L (ref 3.5–5.1)
SODIUM: 139 mmol/L (ref 135–145)

## 2015-08-30 LAB — CBC WITH DIFFERENTIAL/PLATELET
BASOS ABS: 0 10*3/uL (ref 0.0–0.1)
BASOS PCT: 0 %
Eosinophils Absolute: 0 10*3/uL (ref 0.0–0.7)
Eosinophils Relative: 0 %
HEMATOCRIT: 30.9 % — AB (ref 39.0–52.0)
HEMOGLOBIN: 9.4 g/dL — AB (ref 13.0–17.0)
LYMPHS PCT: 19 %
Lymphs Abs: 2.1 10*3/uL (ref 0.7–4.0)
MCH: 26.3 pg (ref 26.0–34.0)
MCHC: 30.4 g/dL (ref 30.0–36.0)
MCV: 86.3 fL (ref 78.0–100.0)
MONO ABS: 0.7 10*3/uL (ref 0.1–1.0)
Monocytes Relative: 6 %
NEUTROS ABS: 8.5 10*3/uL — AB (ref 1.7–7.7)
NEUTROS PCT: 75 %
Platelets: 404 10*3/uL — ABNORMAL HIGH (ref 150–400)
RBC: 3.58 MIL/uL — AB (ref 4.22–5.81)
RDW: 14.8 % (ref 11.5–15.5)
WBC: 11.3 10*3/uL — AB (ref 4.0–10.5)

## 2015-08-30 LAB — GLUCOSE, CAPILLARY
Glucose-Capillary: 112 mg/dL — ABNORMAL HIGH (ref 65–99)
Glucose-Capillary: 160 mg/dL — ABNORMAL HIGH (ref 65–99)
Glucose-Capillary: 127 mg/dL — ABNORMAL HIGH (ref 65–99)
Glucose-Capillary: 141 mg/dL — ABNORMAL HIGH (ref 65–99)

## 2015-08-30 NOTE — Progress Notes (Signed)
Occupational Therapy Session Note  Patient Details  Name: Craig Bentley MRN: [id number] Date of Birth: 04/07/1955  Today's Date: 08/30/2015 OT Individual Time: 9470-9628 and 3662-9476 OT Individual Time Calculation (min): 59 min and 29 min    Short Term Goals: Week 1:  OT Short Term Goal 1 (Week 1): Pt will transfer to toilet with squat pivot with S. OT Short Term Goal 2 (Week 1): Pt will be able to tolerate standing for at least 2 minutes to increase Independence with toileting. OT Short Term Goal 3 (Week 1): Pt will dress with set up from bed level.  Skilled Therapeutic Interventions/Progress Updates:  Session 1: Upon entering the room, pt seated in wheelchair with no c/o pain this session. Pt agreeable to bathing and dressing. Pt performed self care tasks while seated in wheelchair at sink. Pt required min verbal cues for sequencing and initiation of task. Pt declined washing peri area and buttocks. Pt standing from wheelchair with min A and required min A for balance during LB clothing management. UB self care performed while seated in wheelchair with set up A. Pt needing total A to don L sock. When engaged in self care tasks, OT began education regarding energy conservation. Pt verbalized understanding and education to continue. Grooming performed with set up A while seated in wheelchair. Pt remained in wheelchair at end of session with call bell and all needed items within reach.   Session 2: Upon entering the room, NT assisting pt back to bed. Pt with no c/o pain but reports fatigue. Pt agreeable to therapeutic exercise from bed level. OT demonstrated B UE strengthening exercises with use of orange, level 2 theraband. Pt returned demonstrations of 3 sets of 10 bicep curls, chest pulls, and alternating punches. Pt needing rest breaks secondary to fatigue with exercise. Pt remaining supine with bed alarm activated and call bell within reach upon exiting the room.   Therapy  Documentation Precautions:  Precautions Precautions: Fall Restrictions Weight Bearing Restrictions: Yes RLE Weight Bearing: Non weight bearing Vital Signs: Therapy Vitals Pulse Rate: 92 BP: (!) 114/56 mmHg Pain: Pain Assessment Pain Score: 5  ADL: ADL ADL Comments: Refer to functional navigator  See Function Navigator for Current Functional Status.   Therapy/Group: Individual Therapy  Phineas Semen 08/30/2015, 12:42 PM

## 2015-08-30 NOTE — Progress Notes (Signed)
Physical Therapy Session Note  Patient Details  Name: Craig Bentley MRN: [id number] Date of Birth: 1955-05-12  Today's Date: 08/30/2015 PT Individual Time: 0800-0900 0938-1829 PT Individual Time Calculation (min): 60 min 39 min   Short Term Goals: Week 1:  PT Short Term Goal 1 (Week 1): STG =LTG due to ELOS.   Skilled Therapeutic Interventions/Progress Updates:    Patient received suine in bed and agreeable to PT.  Patient performed supine to sit with supervision A from PT with slight  Use of bead features and min cues from PT for improved LE positioning to prevent WB through R LE. Squat pivot transfer performed to [facility] from bed with min A from PT and min cues for improved UE positioning to allow successful transfer.   WC mobiltiy for 175f and 1544fwith supervision from PT and cues for improved use of BUE to prevent verr L and R.   Patient performed squat pivot transfer to bed with min A from PT and mod cues for improved technique. As well as sit<>supine with supervision A and min cues for improved use of BUE.  BKA therex Bridge with push through thigh on bolster with 3 second hold x 10  SAQ RLE x 12  glute sets BLE x 12  SLR BLE x 10 sidelying hip extension R LE.  Supine hip abduction x 12 BLE   LAQ x 10 BLE  Cues for improved technique and increased control of eccentric movements as well as improved ROM as tolerated with emphasis on Knee extension in the RLE  Gait training for 10 ft with min-Mod A and RW. Mod cues for improved push through BUE and to keep COM closer to RW.   PT noted indentation on R limb distal to knee from Wound vac cord under residual limb sock. Sock removed and RN made aware.   Session 2.   Bed mobility, for supine to sit with Supervision A from PT and min cues of bed rails with cues to reduce use of rails to simulate return to home. Close Su[place]rovided with Squat pivot x 1 with min cues for safety and positioning of BUE and LLE. .  WC mobility on tile  for [id number] carpet 100ftx26f controlled environments. Min cues for floor transitions and increased use of R UE to maintain straight trajectory.   Left sitting in WC with call bell in reach at end of session.    Therapy Documentation Precautions:  Precautions Precautions: Fall Restrictions Weight Bearing Restrictions: Yes RLE Weight Bearing: Non weight bearing    Pain: Pain Assessment Pain Assessment: 0-10 Pain Score: 9  Pain Location: Leg Pain Orientation: Right Pain Descriptors / Indicators: Aching Pain Onset: Gradual Pain Intervention(s): Medication (See eMAR)      See Function Navigator for Current Functional Status.   Therapy/Group: Individual Therapy  AustinLorie [id number], 9:01 AM

## 2015-08-30 NOTE — Progress Notes (Signed)
[place] PHYSICAL MEDICINE & REHABILITATION     PROGRESS NOTE  Subjective/Complaints:  Pt laying in bed this AM. He states he slept well.  He appears less confused, but notes that he is still sleepy. He had a good first day of therapies yesterday.   ROS: Denies CP, SOB, N/V/D.  Objective: Vital Signs: Blood pressure 127/65, pulse 85, temperature 98.6 F (37 C), temperature source Oral, resp. rate 16, height '5\' 11"'$  (1.803 m), weight 69.8 kg (153 lb 14.1 oz), SpO2 97 %. Dg Chest 2 View  08/29/2015  CLINICAL DATA:  Fever, productive cough EXAM: CHEST  2 VIEW COMPARISON:  08/21/2015 FINDINGS: Mild patchy left lower lobe opacity, overlying the spine on the lateral view, suspicious for pneumonia. The heart is normal in size. Visualized osseous structures are within normal limits. IMPRESSION: Left lower lobe opacity, suspicious for pneumonia. Electronically Signed   By: Julian Hy M.D.   On: 08/29/2015 19:19    Recent Labs  08/29/15 0544 08/30/15 0602  WBC 12.9* 11.3*  HGB 9.2* 9.4*  HCT 29.8* 30.9*  PLT 358 404*    Recent Labs  08/29/15 0544 08/30/15 0602  NA 139 139  K 4.0 3.9  CL 103 101  GLUCOSE 115* 126*  BUN 9 9  CREATININE 0.55* 0.63  CALCIUM 9.2 9.2   CBG (last 3)   Recent Labs  08/29/15 1731 08/29/15 2105 08/30/15 0655  GLUCAP 126* 121* 112*    Wt Readings from Last 3 Encounters:  08/30/15 69.8 kg (153 lb 14.1 oz)  08/28/15 69.6 kg (153 lb 7 oz)  05/16/15 78.926 kg (174 lb)    Physical Exam:  BP 127/65 mmHg  Pulse 85  Temp(Src) 98.6 F (37 C) (Oral)  Resp 16  Ht '5\' 11"'$  (1.803 m)  Wt 69.8 kg (153 lb 14.1 oz)  BMI 21.47 kg/m2  SpO2 97% Constitutional: He appears well-developed and well-nourished.  HENT: Normocephalic and atraumatic.  Eyes: Conjunctivae and EOM are normal. No scleral icterus.  Cardiovascular: Normal rate and regular rhythm.  Respiratory: Effort normal and breath sounds normal. No stridor. He exhibits no tenderness.  GI:  Soft. Bowel sounds are normal. He exhibits no distension. There is no tenderness.  Musculoskeletal: He exhibits no edema. R-BKA with VAC in place.  Neurological: He is alert and oriented x3 with cues and time.  Able to follow basic commands without difficulty.  Motor: 4/5 throughout B/l and LLE: RLE 3/5 HF Skin: Skin is warm and dry. VAC in place, suctioning without leaks. Psychiatric: He has a normal mood and affect. His behavior is normal.   Assessment/Plan: 1. Functional deficits secondary to PAD, s/p right BKA which require 3+ hours per day of interdisciplinary therapy in a comprehensive inpatient rehab setting. Physiatrist is providing close team supervision and 24 hour management of active medical problems listed below. Physiatrist and rehab team continue to assess barriers to discharge/monitor patient progress toward functional and medical goals.  Function:  Bathing Bathing position   Position: Sitting EOB  Bathing parts Body parts bathed by patient: Right arm, Left arm, Chest, Abdomen, Right upper leg, Left upper leg, Front perineal area Body parts bathed by helper: Left lower leg, Back, Buttocks  Bathing assist        Upper Body Dressing/Undressing Upper body dressing   What is the patient wearing?: Hospital gown                Upper body assist        Lower Body  Dressing/Undressing Lower body dressing   What is the patient wearing?: Hospital Gown                              Lower body assist        Toileting Toileting   Toileting steps completed by patient: Adjust clothing after toileting Toileting steps completed by helper: Adjust clothing prior to toileting, Performs perineal hygiene Toileting Assistive Devices: Grab bar or rail  Toileting assist Assist level: Touching or steadying assistance (Pt.75%)   Transfers Chair/bed transfer   Chair/bed transfer method: Squat pivot Chair/bed transfer assist level: Touching or steadying assistance  (Pt > 75%) Chair/bed transfer assistive device: Bedrails, Armrests     Locomotion Ambulation     Max distance: 6 ft Assist level: Moderate assist (Pt 50 - 74%)   Wheelchair   Type: Manual Max wheelchair distance: 127f Assist Level: Supervision or verbal cues  Cognition Comprehension Comprehension assist level: Follows complex conversation/direction with extra time/assistive device  Expression Expression assist level: Expresses basic 75 - 89% of the time/requires cueing 10 - 24% of the time. Needs helper to occlude trach/needs to repeat words.  Social Interaction Social Interaction assist level: Interacts appropriately with others with medication or extra time (anti-anxiety, antidepressant).  Problem Solving Problem solving assist level: Solves basic 75 - 89% of the time/requires cueing 10 - 24% of the time  Memory Memory assist level: Recognizes or recalls 75 - 89% of the time/requires cueing 10 - 24% of the time    Medical Problem List and Plan: 1. Functional and mobility deficits secondary to PAD, s/p right BKA on 7/5  Cont CIR  2. DVT Prophylaxis/Anticoagulation: lovenox '40mg'$  sq daily 3. Pain Management: pt reports good pain control at present.  tramadol/oxycodone '5mg'$  q4-6 prn.  robaxin '500mg'$  prn muscle spasms.  gabapentin for peripheral neuropathic pain 4. Mood:  -haldol, aricept, clozapine 5. Neuropsych: This patient is capable of making decisions on his own behalf. 6. Skin/Wound Care: Vac to wound approx 7 days (~7/12), shrinker 7. Fluids/Electrolytes/Nutrition: encourage po, check admission labs 8. Acute Renal Failure:  Resolved on 7/7 9. 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 Essential HTN - metoprolol '25mg'$  bid 11. DM2---diet controlled.  Cont to monitor, will consider medications if necessary  check cbg's AC and HS. SSI as appropriate 12. Acute encephalopathy--?resolved 13. Fever: Afebrile x24 hours  Leukocytosis  trending down  UA with some bacteria, Ucx pending  CXR 7/7 reviewed, suggesting PNA  Clinically and objectively doing better, will cont to monitor for time being and consider abx if necessary  Will encourage IS 14. ABLA  Hb 9.4 on 7/8  Cont to monitor  LOS (Days) 2 A FACE TO FACE EVALUATION WAS PERFORMED  Ankit ALorie Phenix7/09/2015 8:48 AM

## 2015-08-31 LAB — GLUCOSE, CAPILLARY
Glucose-Capillary: 182 mg/dL — ABNORMAL HIGH (ref 65–99)
Glucose-Capillary: 104 mg/dL — ABNORMAL HIGH (ref 65–99)
Glucose-Capillary: 156 mg/dL — ABNORMAL HIGH (ref 65–99)
Glucose-Capillary: 134 mg/dL — ABNORMAL HIGH (ref 65–99)

## 2015-08-31 LAB — URINE CULTURE

## 2015-08-31 NOTE — Progress Notes (Addendum)
[place] PHYSICAL MEDICINE & REHABILITATION     PROGRESS NOTE  Subjective/Complaints:  Pt seen laying in bed.  He requests pain medications for incisional pain, but states it is not that bad.   ROS: Denies CP, SOB, N/V/D.  Objective: Vital Signs: Blood pressure 126/67, pulse 86, temperature 98.9 F (37.2 C), temperature source Oral, resp. rate 17, height '5\' 11"'$  (1.803 m), weight 69 kg (152 lb 1.9 oz), SpO2 100 %. Dg Chest 2 View  08/29/2015  CLINICAL DATA:  Fever, productive cough EXAM: CHEST  2 VIEW COMPARISON:  08/21/2015 FINDINGS: Mild patchy left lower lobe opacity, overlying the spine on the lateral view, suspicious for pneumonia. The heart is normal in size. Visualized osseous structures are within normal limits. IMPRESSION: Left lower lobe opacity, suspicious for pneumonia. Electronically Signed   By: Julian Hy M.D.   On: 08/29/2015 19:19    Recent Labs  08/29/15 0544 08/30/15 0602  WBC 12.9* 11.3*  HGB 9.2* 9.4*  HCT 29.8* 30.9*  PLT 358 404*    Recent Labs  08/29/15 0544 08/30/15 0602  NA 139 139  K 4.0 3.9  CL 103 101  GLUCOSE 115* 126*  BUN 9 9  CREATININE 0.55* 0.63  CALCIUM 9.2 9.2   CBG (last 3)   Recent Labs  08/30/15 1628 08/30/15 2119 08/31/15 0653  GLUCAP 127* 141* 182*    Wt Readings from Last 3 Encounters:  08/31/15 69 kg (152 lb 1.9 oz)  08/28/15 69.6 kg (153 lb 7 oz)  05/16/15 78.926 kg (174 lb)    Physical Exam:  BP 126/67 mmHg  Pulse 86  Temp(Src) 98.9 F (37.2 C) (Oral)  Resp 17  Ht '5\' 11"'$  (1.803 m)  Wt 69 kg (152 lb 1.9 oz)  BMI 21.23 kg/m2  SpO2 100% Constitutional: He appears well-developed and well-nourished.  HENT: Normocephalic and atraumatic.  Eyes: Conjunctivae and EOM are normal. No scleral icterus.  Cardiovascular: Normal rate and regular rhythm.  Respiratory: Slightly decrease breath sounds on left. Effort normal. No stridor. He exhibits no tenderness.  GI: Soft. Bowel sounds are normal. He exhibits  no distension. There is no tenderness.  Musculoskeletal: He exhibits no edema. R-BKA with VAC in place.  Neurological: He is alert and oriented x3 with cues and time.  Able to follow basic commands without difficulty.  Motor: 4/5 throughout B/l and LLE: RLE 3/5 HF Skin: Skin is warm and dry. VAC in place, suctioning without leaks. Psychiatric: He has a normal mood and affect. His behavior is normal.   Assessment/Plan: 1. Functional deficits secondary to PAD, s/p right BKA which require 3+ hours per day of interdisciplinary therapy in a comprehensive inpatient rehab setting. Physiatrist is providing close team supervision and 24 hour management of active medical problems listed below. Physiatrist and rehab team continue to assess barriers to discharge/monitor patient progress toward functional and medical goals.  Function:  Bathing Bathing position   Position: Wheelchair/chair at sink  Bathing parts Body parts bathed by patient: Right arm, Left arm, Chest, Abdomen, Right upper leg, Left upper leg Body parts bathed by helper: Left lower leg, Back  Bathing assist Assist Level: Touching or steadying assistance(Pt > 75%)      Upper Body Dressing/Undressing Upper body dressing   What is the patient wearing?: Pull over shirt/dress     Pull over shirt/dress - Perfomed by patient: Thread/unthread right sleeve, Thread/unthread left sleeve, Put head through opening, Pull shirt over trunk  Upper body assist Assist Level: Set up      Lower Body Dressing/Undressing Lower body dressing   What is the patient wearing?: Pants, Non-skid slipper socks     Pants- Performed by patient: Thread/unthread left pants leg, Pull pants up/down Pants- Performed by helper: Thread/unthread right pants leg   Non-skid slipper socks- Performed by helper: Don/doff left sock                  Lower body assist Assist for lower body dressing: Touching or steadying assistance (Pt > 75%)       Toileting Toileting   Toileting steps completed by patient: Adjust clothing after toileting Toileting steps completed by helper: Adjust clothing prior to toileting, Performs perineal hygiene Toileting Assistive Devices: Grab bar or rail  Toileting assist Assist level: Touching or steadying assistance (Pt.75%)   Transfers Chair/bed transfer   Chair/bed transfer method: Squat pivot Chair/bed transfer assist level: Supervision or verbal cues Chair/bed transfer assistive device: Armrests     Locomotion Ambulation     Max distance: 65f Assist level: Touching or steadying assistance (Pt > 75%)   Wheelchair   Type: Manual Max wheelchair distance: 150 Assist Level: Supervision or verbal cues  Cognition Comprehension Comprehension assist level: Follows complex conversation/direction with extra time/assistive device  Expression Expression assist level: Expresses basic 75 - 89% of the time/requires cueing 10 - 24% of the time. Needs helper to occlude trach/needs to repeat words.  Social Interaction Social Interaction assist level: Interacts appropriately 75 - 89% of the time - Needs redirection for appropriate language or to initiate interaction.  Problem Solving Problem solving assist level: Solves basic 75 - 89% of the time/requires cueing 10 - 24% of the time  Memory Memory assist level: Recognizes or recalls 75 - 89% of the time/requires cueing 10 - 24% of the time    Medical Problem List and Plan: 1. Functional and mobility deficits secondary to PAD, s/p right BKA on 7/5  Cont CIR  2. DVT Prophylaxis/Anticoagulation: lovenox '40mg'$  sq daily 3. Pain Management: pt reports good pain control at present.  tramadol/oxycodone '5mg'$  q4-6 prn.  robaxin '500mg'$  prn muscle spasms.  gabapentin for peripheral neuropathic pain 4. Mood:  -haldol, aricept, clozapine 5. Neuropsych: This patient is capable of making decisions on his own  behalf. 6. Skin/Wound Care: Vac to wound approx 7 days (~7/12), shrinker 7. Fluids/Electrolytes/Nutrition: encourage po, check admission labs 8. Acute Renal Failure:  Resolved on 7/7 9. 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 Essential HTN - metoprolol '25mg'$  bid 11. DM2---diet controlled.  Cont to monitor, will consider medications if necessary  check cbg's AC and HS. SSI as appropriate 12. Acute encephalopathy--?resolved 13. Fever: Afebrile x48 hours  Leukocytosis trending down  UA with some bacteria, Ucx remains pending  CXR 7/7 reviewed, suggesting PNA  Clinically and objectively doing better, will cont to monitor for time being and consider abx if necessary  Encourage IS 14. ABLA  Hb 9.4 on 7/8  Cont to monitor  LOS (Days) 3 A FACE TO FACE EVALUATION WAS PERFORMED  Ankit ALorie Phenix7/10/2015 7:56 AM

## 2015-09-01 ENCOUNTER — Inpatient Hospital Stay (HOSPITAL_COMMUNITY): Payer: Medicaid Other | Admitting: Occupational Therapy

## 2015-09-01 ENCOUNTER — Encounter (HOSPITAL_COMMUNITY): Payer: Self-pay

## 2015-09-01 ENCOUNTER — Inpatient Hospital Stay (HOSPITAL_COMMUNITY): Payer: Self-pay | Admitting: Occupational Therapy

## 2015-09-01 ENCOUNTER — Inpatient Hospital Stay (HOSPITAL_COMMUNITY): Payer: Medicaid Other | Admitting: Physical Therapy

## 2015-09-01 LAB — GLUCOSE, CAPILLARY
GLUCOSE-CAPILLARY: 122 mg/dL — AB (ref 65–99)
GLUCOSE-CAPILLARY: 94 mg/dL (ref 65–99)
GLUCOSE-CAPILLARY: 152 mg/dL — AB (ref 65–99)
Glucose-Capillary: 116 mg/dL — ABNORMAL HIGH (ref 65–99)

## 2015-09-01 NOTE — Progress Notes (Signed)
Physical Therapy Session Note  Patient Details  Name: Craig Bentley MRN: [id number] Date of Birth: 1955/07/08  Today's Date: 09/01/2015 PT Individual Time: 0092-3300 and 7622-6333 PT Individual Time Calculation (min): 40 min and 71 min  Short Term Goals: Week 1:  PT Short Term Goal 1 (Week 1): STG =LTG due to ELOS.   Skilled Therapeutic Interventions/Progress Updates:    Treatment 1: Pt received in w/c & agreeable to PT, denying c/o pain. Educated pt on phantom pain & need to gently rub residual limb for desensitization purposes, to reduce phantom sensation. Session focused on gait training & w/c mobility. In gym, gait training completed 13 ft + 13 ft + 13 with RW & min A. PT educated & demonstrated to pt to push up with BUE to allow LLE to advance forwards instead of hopping through LLE but pt with fair/poor demonstration. Pt also constantly looking down during ambulation even with cuing to look up. W/c mobility completed with pt pushing with BUE x 210 ft + 210 with supervision for linear path, min A for turns, and minimal verbal cuing throughout for obstacle avoidance. Pt performed 2x10 quad sets RLE and therapist encouraged pt to perform exercise throughout the day with pt voicing understanding. At end of session pt left sitting in w/c in room with all needs within reach.  Treatment 2: Pt received in w/c & agreeable to PT, denying c/o pain. Session focused on w/c mobility, endurance training, & pt education. Pt able to propel w/c off of unit>outside with multiple rest breaks 2/2 fatigue & cuing to back on to elevator; pt able to propel w/c with supervision overall. Back on unit pt educated on w/c pushups & need to perform pressure relieving techniques every 20 minutes. Pt able to return demonstrate seated w/c pushups. Utilized BUE recumbent bike for BUE strengthening & endurance training; level 3 x 10 minutes with 2 rest breaks 2/2 fatigue. Pt navigated w/c through cones with supervision successfully  avoiding cones on last trial. At end of session pt returned to room & was able to navigate in small spaces in room with supervision. Pt left in w/c with all needs within reach.   Therapy Documentation Precautions:  Precautions Precautions: Fall Precaution Comments: wound vac Restrictions Weight Bearing Restrictions: Yes RLE Weight Bearing: Non weight bearing  Pain: Pain Assessment Pain Assessment: No/denies pain   See Function Navigator for Current Functional Status.   Therapy/Group: Individual Therapy  Waunita Schooner 09/01/2015, 1:30 PM

## 2015-09-01 NOTE — Progress Notes (Signed)
Occupational Therapy Session Note  Patient Details  Name: Craig Bentley MRN: [id number] Date of Birth: 1955-07-29  Today's Date: 09/01/2015 OT Individual Time: 3112-1624 OT Individual Time Calculation (min): 30 min    Short Term Goals: Week 1:  OT Short Term Goal 1 (Week 1): Pt will transfer to toilet with squat pivot with S. OT Short Term Goal 2 (Week 1): Pt will be able to tolerate standing for at least 2 minutes to increase Independence with toileting. OT Short Term Goal 3 (Week 1): Pt will dress with set up from bed level.  Skilled Therapeutic Interventions/Progress Updates:    Pt completed sit to stand transitions and functional transfers using the RW.  Min assist for initial sit to stand and mobility but as pt fatigued he required mod assist to maintain balance and take adequate hops.  Increased trunk flexion noted as well as pt pushing the walker too far out in front of him.  Completed transfer to the 3:1 over the toilet for simulated toileting with mod assist, ambulating into and out of the bathroom approximately 36f.  Finished session with 2 sets of tricep extension exercises for each UE for 20 repetitions, with min instructional cueing for technique.  Pt left in wheelchair at the side of the bed with call button and phone in reach.  Encouraged pt to continue more tricep exercises later as he watched TV.    Therapy Documentation Precautions:  Precautions Precautions: Fall Precaution Comments: wound vac Restrictions Weight Bearing Restrictions: Yes RLE Weight Bearing: Non weight bearing  Pain: Pain Assessment Pain Assessment: 0-10 Pain Score: 5  Pain Type: Acute pain Pain Location: Leg Pain Orientation: Right Pain Descriptors / Indicators: Aching;Sore Pain Onset: On-going Patients Stated Pain Goal: 3 Pain Intervention(s): Repositioned;Emotional support Multiple Pain Sites: No ADL: See Function Navigator for Current Functional Status.   Therapy/Group: Individual  Therapy  Shaundrea Carrigg OTR/L 09/01/2015, 12:39 PM

## 2015-09-01 NOTE — Progress Notes (Addendum)
[place] PHYSICAL MEDICINE & REHABILITATION     PROGRESS NOTE  Subjective/Complaints:  Pt laying in bed.  He slept well overnight.  He doesn't have any complaints this AM.    ROS: Denies CP, SOB, N/V/D.  Objective: Vital Signs: Blood pressure 120/50, pulse 80, temperature 99.3 F (37.4 C), temperature source Oral, resp. rate 16, height '5\' 11"'$  (1.803 m), weight 70.5 kg (155 lb 6.8 oz), SpO2 99 %. No results found.  Recent Labs  08/30/15 0602  WBC 11.3*  HGB 9.4*  HCT 30.9*  PLT 404*    Recent Labs  08/30/15 0602  NA 139  K 3.9  CL 101  GLUCOSE 126*  BUN 9  CREATININE 0.63  CALCIUM 9.2   CBG (last 3)   Recent Labs  08/31/15 1626 08/31/15 2116 09/01/15 0640  GLUCAP 156* 134* 122*    Wt Readings from Last 3 Encounters:  09/01/15 70.5 kg (155 lb 6.8 oz)  08/28/15 69.6 kg (153 lb 7 oz)  05/16/15 78.926 kg (174 lb)    Physical Exam:  BP 120/50 mmHg  Pulse 80  Temp(Src) 99.3 F (37.4 C) (Oral)  Resp 16  Ht '5\' 11"'$  (1.803 m)  Wt 70.5 kg (155 lb 6.8 oz)  BMI 21.69 kg/m2  SpO2 99% Constitutional: He appears well-developed and well-nourished.  HENT: Normocephalic and atraumatic.  Eyes: Conjunctivae and EOM are normal. No scleral icterus.  Cardiovascular: Normal rate and regular rhythm.  Respiratory: Bilateral clear to ausculation. Effort normal. No stridor. He exhibits no tenderness.  GI: Soft. Bowel sounds are normal. He exhibits no distension. There is no tenderness.  Musculoskeletal: He exhibits no edema. R-BKA with VAC in place.  Neurological: He is alert and oriented x2.  Able to follow basic commands without difficulty.  Motor: 4/5 throughout B/l and LLE: RLE 3/5 HF Skin: Skin is warm and dry. VAC in place, suctioning without leaks. Psychiatric: He has a normal mood and affect. His behavior is normal.   Assessment/Plan: 1. Functional deficits secondary to PAD, s/p right BKA which require 3+ hours per day of interdisciplinary therapy in a  comprehensive inpatient rehab setting. Physiatrist is providing close team supervision and 24 hour management of active medical problems listed below. Physiatrist and rehab team continue to assess barriers to discharge/monitor patient progress toward functional and medical goals.  Function:  Bathing Bathing position   Position: Wheelchair/chair at sink  Bathing parts Body parts bathed by patient: Right arm, Left arm, Chest, Abdomen, Right upper leg, Left upper leg Body parts bathed by helper: Left lower leg, Back  Bathing assist Assist Level: Touching or steadying assistance(Pt > 75%)      Upper Body Dressing/Undressing Upper body dressing   What is the patient wearing?: Pull over shirt/dress     Pull over shirt/dress - Perfomed by patient: Thread/unthread right sleeve, Thread/unthread left sleeve, Put head through opening, Pull shirt over trunk          Upper body assist Assist Level: Set up      Lower Body Dressing/Undressing Lower body dressing   What is the patient wearing?: Pants, Non-skid slipper socks     Pants- Performed by patient: Thread/unthread left pants leg, Pull pants up/down Pants- Performed by helper: Thread/unthread right pants leg   Non-skid slipper socks- Performed by helper: Don/doff left sock                  Lower body assist Assist for lower body dressing: Touching or steadying assistance (Pt >  75%)      Toileting Toileting   Toileting steps completed by patient: Adjust clothing after toileting Toileting steps completed by helper: Adjust clothing prior to toileting, Performs perineal hygiene Toileting Assistive Devices: Grab bar or rail  Toileting assist Assist level: Touching or steadying assistance (Pt.75%)   Transfers Chair/bed transfer   Chair/bed transfer method: Squat pivot Chair/bed transfer assist level: Touching or steadying assistance (Pt > 75%) Chair/bed transfer assistive device: Armrests     Locomotion Ambulation      Max distance: 15 Assist level: Touching or steadying assistance (Pt > 75%)   Wheelchair   Type: Manual Max wheelchair distance: 200 Assist Level: Supervision or verbal cues  Cognition Comprehension Comprehension assist level: Follows complex conversation/direction with extra time/assistive device  Expression Expression assist level: Expresses basic 75 - 89% of the time/requires cueing 10 - 24% of the time. Needs helper to occlude trach/needs to repeat words.  Social Interaction Social Interaction assist level: Interacts appropriately 90% of the time - Needs monitoring or encouragement for participation or interaction.  Problem Solving Problem solving assist level: Solves basic 75 - 89% of the time/requires cueing 10 - 24% of the time  Memory Memory assist level: Recognizes or recalls 75 - 89% of the time/requires cueing 10 - 24% of the time    Medical Problem List and Plan: 1. Functional and mobility deficits secondary to PAD, s/p right BKA on 7/5  Cont CIR  2. DVT Prophylaxis/Anticoagulation: lovenox '40mg'$  sq daily 3. Pain Management: pt reports good pain control at present.  tramadol/oxycodone '5mg'$  q4-6 prn.  robaxin '500mg'$  prn muscle spasms.  gabapentin for peripheral neuropathic pain 4. Mood:  -haldol, aricept, clozapine 5. Neuropsych: This patient is capable of making decisions on his own behalf. 6. Skin/Wound Care: Vac to wound approx 7 days (~7/12), shrinker 7. Fluids/Electrolytes/Nutrition: encourage po, check admission labs 8. Acute Renal Failure:  Resolved on 7/7 9. 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 Essential HTN - metoprolol '25mg'$  bid 11. DM2---diet controlled.  Cont to monitor, will consider medications if necessary  check cbg's AC and HS. SSI as appropriate 12. Acute encephalopathy--?resolved 13. Fever: Afebrile x48 hours  Leukocytosis trending down  UA with some bacteria, Ucx insignificant growth  CXR 7/7 reviewed,  suggesting PNA  Clinically stable, will cont to monitor for time being and consider abx if necessary  Encourage IS  Will order labs for tomorrow 14. ABLA  Hb 9.4 on 7/8  Cont to monitor  Will order labs for tomorrow  LOS (Days) 4 A FACE TO FACE EVALUATION WAS PERFORMED  Ankit Lorie Phenix 09/01/2015 8:56 AM

## 2015-09-01 NOTE — Progress Notes (Signed)
Physical Therapy Session Note  Patient Details  Name: Craig Bentley MRN: [id number] Date of Birth: 06-15-1955  Today's Date: 09/01/2015 PT Individual Time: 0800-0900 PT Individual Time Calculation (min): 60 min   Short Term Goals: Week 1:  PT Short Term Goal 1 (Week 1): STG =LTG due to ELOS.   Skilled Therapeutic Interventions/Progress Updates:    Pt received supine in bed, c/o pain in R residual limb as below and agreeable to treatment. Supine >sit with mod I and HOB elevated. Transfer bed >w/c with min guard. Seated at sink pt performed face washing and brushing teeth with setupA. W/c propulsion 2x200' with BUEs for strengthening and endurance. Gait with RW x15' with min guard; required mod cueing for safety as pt moving RW at the same time as hopping with LLE, as well as impulsively sitting sideways on table prior to completing turn and without reaching back with UE. Supine/seated LE strengthening exercises with pt given handout from previous session and instructed to perform as he would if he were alone; required max verbal/tactile cues for technique and attention to counting reps/sets. Transfer w/c <>nustep with S. Nustep with BUE/LLE x8 min total on level 4 with average 45 steps/min; requires several rest breaks due to fatigue. Pt attempted to don B leg rests; required increased time and able to don L leg rest and totalA for RLE. Returned to room with w/c propulsion as above; remained seated in w/c at completion of session, all needs in reach.   Therapy Documentation Precautions:  Precautions Precautions: Fall Restrictions Weight Bearing Restrictions: Yes RLE Weight Bearing: Non weight bearing Pain: Pain Assessment Pain Assessment: 0-10 Pain Score: 6  Pain Type: Surgical pain Pain Location: Leg Pain Orientation: Right Pain Descriptors / Indicators: Aching;Sore Pain Onset: On-going Patients Stated Pain Goal: 3 Pain Intervention(s): Other (Comment) (pre-medicated) Multiple Pain  Sites: No   See Function Navigator for Current Functional Status.   Therapy/Group: Individual Therapy  [name] 09/01/2015, 9:07 AM

## 2015-09-02 ENCOUNTER — Inpatient Hospital Stay (HOSPITAL_COMMUNITY): Payer: Self-pay | Admitting: Occupational Therapy

## 2015-09-02 ENCOUNTER — Inpatient Hospital Stay (HOSPITAL_COMMUNITY): Payer: Medicaid Other | Admitting: Physical Therapy

## 2015-09-02 ENCOUNTER — Inpatient Hospital Stay (HOSPITAL_COMMUNITY): Payer: Medicaid Other | Admitting: *Deleted

## 2015-09-02 ENCOUNTER — Inpatient Hospital Stay (HOSPITAL_COMMUNITY): Payer: Medicaid Other

## 2015-09-02 LAB — CBC WITH DIFFERENTIAL/PLATELET
BASOS PCT: 0 %
Basophils Absolute: 0 10*3/uL (ref 0.0–0.1)
Eosinophils Absolute: 0 10*3/uL (ref 0.0–0.7)
Eosinophils Relative: 0 %
HEMATOCRIT: 29.2 % — AB (ref 39.0–52.0)
Hemoglobin: 9 g/dL — ABNORMAL LOW (ref 13.0–17.0)
Lymphocytes Relative: 20 %
Lymphs Abs: 2.3 10*3/uL (ref 0.7–4.0)
MCH: 26.2 pg (ref 26.0–34.0)
MCHC: 30.8 g/dL (ref 30.0–36.0)
MCV: 85.1 fL (ref 78.0–100.0)
MONO ABS: 0.7 10*3/uL (ref 0.1–1.0)
MONOS PCT: 6 %
NEUTROS ABS: 8.8 10*3/uL — AB (ref 1.7–7.7)
Neutrophils Relative %: 74 %
Platelets: 408 10*3/uL — ABNORMAL HIGH (ref 150–400)
RBC: 3.43 MIL/uL — ABNORMAL LOW (ref 4.22–5.81)
RDW: 14.8 % (ref 11.5–15.5)
WBC: 11.9 10*3/uL — ABNORMAL HIGH (ref 4.0–10.5)

## 2015-09-02 LAB — GLUCOSE, CAPILLARY
Glucose-Capillary: 137 mg/dL — ABNORMAL HIGH (ref 65–99)
Glucose-Capillary: 144 mg/dL — ABNORMAL HIGH (ref 65–99)

## 2015-09-02 LAB — BASIC METABOLIC PANEL WITH GFR
Anion gap: 6 (ref 5–15)
BUN: 10 mg/dL (ref 6–20)
CO2: 29 mmol/L (ref 22–32)
Calcium: 9 mg/dL (ref 8.9–10.3)
Chloride: 104 mmol/L (ref 101–111)
Creatinine, Ser: 0.57 mg/dL — ABNORMAL LOW (ref 0.61–1.24)
GFR calc Af Amer: >60 mL/min
GFR calc non Af Amer: >60 mL/min
Glucose, Bld: 134 mg/dL — ABNORMAL HIGH (ref 65–99)
Potassium: 3.7 mmol/L (ref 3.5–5.1)
Sodium: 139 mmol/L (ref 135–145)

## 2015-09-02 NOTE — Progress Notes (Addendum)
Physical Therapy Session Note  Patient Details  Name: Craig Bentley MRN: [id number] Date of Birth: August 13, 1955  Today's Date: 09/02/2015 PT Individual Time: 1304-1402 PT Individual Time Calculation (min): 58 min   Short Term Goals: Week 1:  PT Short Term Goal 1 (Week 1): STG =LTG due to ELOS.   Skilled Therapeutic Interventions/Progress Updates:    Pt received sitting on EOB finishing lunch & agreeable to PT. Pt noted 6-7/10 R residual limb pain but denied pain medication as he reports it makes him sleepy. Discussed home visit later during week with OT & pt agreeable. Pt requested to work on w/c mobility during session therefore pt propelled w/c off of unit & outside. Pt propelled over w/c ramp connected to parking deck & required min A throughout to control descent & to propel w/c up ramp. Pt required less rest breaks today during task but cuing for anterior weight shift when ascending ramp. At end of session pt left in w/c in room with all needs within reach.   Therapy Documentation Precautions:  Precautions Precautions: Fall Precaution Comments: wound vac Restrictions Weight Bearing Restrictions: Yes RLE Weight Bearing: Non weight bearing  Pain: Pain Assessment Pain Assessment: 0-10 Pain Score: 6  Pain Location:  (residual limb) Pain Orientation: Right Pain Onset: With Activity Pain Intervention(s):  (denied pain medication)   See Function Navigator for Current Functional Status.   Therapy/Group: Individual Therapy  Waunita Schooner 09/02/2015, 5:24 PM

## 2015-09-02 NOTE — Plan of Care (Signed)
Problem: RH Ambulation Goal: LTG Patient will ambulate in controlled environment (PT) LTG: Patient will ambulate in a controlled environment, # of feet with assistance (PT).  40 ft with LRAD; downgrade 2/2 slow progress Goal: LTG Patient will ambulate in home environment (PT) LTG: Patient will ambulate in home environment, # of feet with assistance (PT).  Outcome: Not Applicable Date Met:  57/89/78 D/c 2/2 slow progress & decreased safety

## 2015-09-02 NOTE — Progress Notes (Signed)
Physical Therapy Session Note  Patient Details  Name: Craig Bentley MRN: [id number] Date of Birth: 1956-02-13  Today's Date: 09/02/2015 PT Individual Time: 1520-1620 PT Individual Time Calculation (min): 60 min   Short Term Goals: Week 1:  PT Short Term Goal 1 (Week 1): STG =LTG due to ELOS.  Week 2:     Skilled Therapeutic Interventions/Progress Updates:  Tx focused on therex for strengthening and ROM as well as functional mobility training. Pt up in [facility], ready for PT. Bed locked in LE ext position for optimal alignment.   Pt instructed in amputee therex program with handout for instruction and demonstration provided. Pt performed the following in supine, sitting, and staning with rest breaks prn due to fatigue.  Seated glute sets, LAQ with 5 sec holds, marching 2x10 Standing R hip ext, ABD, and flexion; L heel toe raises, L mini-squats.  Supine L hip ext in sidelying, glute sets, SAQ with 5 sec hold.   UE arm bike x6 min for strengtheningl  Pt performed sit<>stands and squat-pivot with Min-guard A and bed mobility with S. Pt neeed safety cues throughout as well as cues for WC parts management. Pt instructed in repeated leg rest management, but no carry-over evident.   Pt performed WC propulsion in controlled setting 2x150' with distant S.   Pt left up in [facility] with all needs in reach.     Therapy Documentation Precautions:  Precautions Precautions: Fall Precaution Comments: wound vac Restrictions Weight Bearing Restrictions: Yes RLE Weight Bearing: Non weight bearing General:   Vital Signs: Therapy Vitals Temp: 98.8 F (37.1 C) Temp Source: Oral Pulse Rate: 89 Resp: 16 BP: (!) 117/59 mmHg Patient Position (if appropriate): Sitting Oxygen Therapy SpO2: 100 % O2 Device: Not Delivered Pain: Pain Assessment Pain Assessment: 0-10 Pain Score: 6  Pain Location:  (residual limb) Pain Orientation: Right Pain Onset: With Activity Pain Intervention(s):  (denied pain  medication)   See Function Navigator for Current Functional Status.   Therapy/Group: Individual Therapy  Soundra Pilon 09/02/2015, 3:39 PM

## 2015-09-02 NOTE — Progress Notes (Addendum)
PHYSICAL MEDICINE & REHABILITATION     PROGRESS NOTE  Subjective/Complaints:  Pt sitting up in his chair.  He is more oriented today and makes jokes, unclear if they are intentional.   ROS: Denies CP, SOB, N/V/D.  Objective: Vital Signs: Blood pressure 133/60, pulse 80, temperature 99.4 F (37.4 C), temperature source Oral, resp. rate 18, height '5\' 11"'$  (1.803 m), weight 70.5 kg (155 lb 6.8 oz), SpO2 97 %. No results found.  Recent Labs  09/02/15 0644  WBC 11.9*  HGB 9.0*  HCT 29.2*  PLT 408*    Recent Labs  09/02/15 0644  NA 139  K 3.7  CL 104  GLUCOSE 134*  BUN 10  CREATININE 0.57*  CALCIUM 9.0   CBG (last 3)   Recent Labs  09/01/15 1714 09/01/15 2039 09/02/15 0636  GLUCAP 116* 152* 137*    Wt Readings from Last 3 Encounters:  09/02/15 70.5 kg (155 lb 6.8 oz)  08/28/15 69.6 kg (153 lb 7 oz)  05/16/15 78.926 kg (174 lb)    Physical Exam:  BP 133/60 mmHg  Pulse 80  Temp(Src) 99.4 F (37.4 C) (Oral)  Resp 18  Ht '5\' 11"'$  (1.803 m)  Wt 70.5 kg (155 lb 6.8 oz)  BMI 21.69 kg/m2  SpO2 97% Constitutional: He appears well-developed and well-nourished.  HENT: Normocephalic and atraumatic.  Eyes: Conjunctivae and EOM are normal. No scleral icterus.  Cardiovascular: Normal rate and regular rhythm.  Respiratory: Bilateral clear to ausculation. Effort normal. No stridor. He exhibits no tenderness.  GI: Soft. Bowel sounds are normal. He exhibits no distension. There is no tenderness.  Musculoskeletal: He exhibits no edema. R-BKA with VAC in place.  Neurological: He is alert and oriented x3.  Able to follow basic commands without difficulty.  Motor: 4+/5 throughout B/l and LLE: RLE 4+/5 HF Skin: Skin is warm and dry. VAC in place, suctioning without leaks.  Dry skin to sacral area, no breakdown Psychiatric: He has a normal mood and affect. His behavior is normal.   Assessment/Plan: 1. Functional deficits secondary to PAD, s/p right BKA which  require 3+ hours per day of interdisciplinary therapy in a comprehensive inpatient rehab setting. Physiatrist is providing close team supervision and 24 hour management of active medical problems listed below. Physiatrist and rehab team continue to assess barriers to discharge/monitor patient progress toward functional and medical goals.  Function:  Bathing Bathing position   Position: Wheelchair/chair at sink  Bathing parts Body parts bathed by patient: Right arm, Left arm, Chest, Abdomen, Right upper leg, Left upper leg Body parts bathed by helper: Left lower leg, Back  Bathing assist Assist Level: Touching or steadying assistance(Pt > 75%)      Upper Body Dressing/Undressing Upper body dressing   What is the patient wearing?: Pull over shirt/dress     Pull over shirt/dress - Perfomed by patient: Thread/unthread right sleeve, Thread/unthread left sleeve, Put head through opening, Pull shirt over trunk          Upper body assist Assist Level: Set up      Lower Body Dressing/Undressing Lower body dressing   What is the patient wearing?: Pants, Non-skid slipper socks     Pants- Performed by patient: Thread/unthread left pants leg, Pull pants up/down Pants- Performed by helper: Thread/unthread right pants leg   Non-skid slipper socks- Performed by helper: Don/doff left sock                  Lower body assist Assist  for lower body dressing: Touching or steadying assistance (Pt > 75%)      Toileting Toileting   Toileting steps completed by patient: Adjust clothing prior to toileting, Performs perineal hygiene, Adjust clothing after toileting Toileting steps completed by helper: Adjust clothing prior to toileting, Performs perineal hygiene, Adjust clothing after toileting (per Braulio Bosch, NT report) Toileting Assistive Devices: Grab bar or rail  Toileting assist Assist level: Touching or steadying assistance (Pt.75%)   Transfers Chair/bed transfer   Chair/bed  transfer method: Squat pivot Chair/bed transfer assist level: Touching or steadying assistance (Pt > 75%) Chair/bed transfer assistive device: Armrests     Locomotion Ambulation     Max distance: 13 ft  Assist level: Touching or steadying assistance (Pt > 75%)   Wheelchair   Type: Manual Max wheelchair distance: 200 ft  Assist Level: Supervision or verbal cues  Cognition Comprehension Comprehension assist level: Follows basic conversation/direction with no assist  Expression Expression assist level: Expresses basic needs/ideas: With no assist  Social Interaction Social Interaction assist level: Interacts appropriately with others with medication or extra time (anti-anxiety, antidepressant).  Problem Solving Problem solving assist level: Solves basic 90% of the time/requires cueing < 10% of the time  Memory Memory assist level: Recognizes or recalls 75 - 89% of the time/requires cueing 10 - 24% of the time    Medical Problem List and Plan: 1. Functional and mobility deficits secondary to PAD, s/p right BKA on 7/5  Cont CIR  2. DVT Prophylaxis/Anticoagulation: lovenox '40mg'$  sq daily 3. Pain Management: pt reports good pain control at present.  tramadol/oxycodone '5mg'$  q4-6 prn.  robaxin '500mg'$  prn muscle spasms.  gabapentin for peripheral neuropathic pain 4. Mood:  -haldol, aricept, clozapine 5. Neuropsych: This patient is capable of making decisions on his own behalf. 6. Skin/Wound Care: Vac to wound approx 7 days (~7/12), shrinker  Dry skin to sacrum - moisture cream 7. Fluids/Electrolytes/Nutrition: encourage po, check admission labs 8. Acute Renal Failure:  Resolved on 7/7 9. 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 Essential HTN - metoprolol '25mg'$  bid 11. DM2---diet controlled.  Cont to monitor, will consider medications if necessary  check cbg's AC and HS. SSI as appropriate 12. Acute encephalopathy--?resolved 13. Fever: Afebrile  x72 hours, however, low grade temps  WBCs 11.9 on 7/11  UA with some bacteria, Ucx insignificant growth  CXR 7/7 reviewed, suggesting PNA, repeat CXR ordered  Will consider abx based on CXR  Encourage IS 14. ABLA  Hb 9.0 on 7/11  Cont to monitor  LOS (Days) 5 A FACE TO FACE EVALUATION WAS PERFORMED  Leronda Lewers Lorie Phenix 09/02/2015 9:25 AM

## 2015-09-02 NOTE — Progress Notes (Signed)
Occupational Therapy Session Note  Patient Details  Name: Craig Bentley MRN: [id number] Date of Birth: 06/09/1955  Today's Date: 09/02/2015 OT Individual Time: 4497-5300 OT Individual Time Calculation (min): 73 min     Skilled Therapeutic Interventions/Progress Updates:    Pt today was in bed prior to skilled OT arrival. Pt reported already having washed up this morning. Pt reported having 6\10 pain. Nursing was notified and pt received medication at start of session. Pt was agreeable to complete LB dressing w/c level. Pt completed supine to sit with supervision for cues on proper hand placement and scooting forward prior to transfer. Squat pivot transfer from bed to w/c completed with Min A. Pt was able to thread bilateral LEs into pant legs and don left sock. Min A required for steadying assistance while standing to pull pants over hips. Pt completed grooming/oral care/hand washing w/c level at sink with setup. In gym, pt engaged in a static standing activity with RW and unilateral support. Visual biofeedback was provided with vcs to raise postural awareness with pt correcting left leaning posture with cuing. Longest standing time out of 2 trials 2 minutes 55 seconds. Pt required Min A for task for balance support. Pt reported wanting to go outside for a part of tx, therefore pt was signed out and taken outside. Pt completed bilateral UE therapeutic exercise at edge of chair with instruction on proper technique and pacing to increase UB strength for functional transfers and tasks. Pt returned to room, wound vac was plugged back in. Pt was left in w/c with all needs within reach.  Pt continues to benefit from balance remediation outside of bathing/dressing tx.     Therapy Documentation Precautions:  Precautions Precautions: Fall Precaution Comments: wound vac Restrictions Weight Bearing Restrictions: Yes RLE Weight Bearing: Non weight bearing General:   Vital Signs:   Pain: Pain  Assessment Pain Assessment: 0-10 Pain Score: 6  Pain Type: Surgical pain;Phantom pain Pain Location: Leg Pain Orientation: Right Pain Descriptors / Indicators: Aching;Sore Pain Frequency: Intermittent Pain Onset: Gradual Patients Stated Pain Goal: 3 Pain Intervention(s): Medication (See eMAR) ADL: ADL ADL Comments: Refer to functional navigator Exercises:  Orange tband x10 reps 2 sets; tricep punches, horizontal abduction, and bicep curls     See Function Navigator for Current Functional Status.   Therapy/Group: Individual Therapy  Ivelis Norgard A Hilton Saephan 09/02/2015, 10:41 AM

## 2015-09-02 NOTE — Consult Note (Signed)
NEUROBEHAVIORAL STATUS EXAM - [facility]   MEDICAL NECESSITY:  Craig Bentley was seen on the [facility] for a neurobehavioral status exam owing to the patient's diagnosis of BKA, and to assist in treatment planning during admission.   Records indicate that Craig Bentley is a "60 y.o. 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 He was admitted on 08/21/15 with lethargy and hypotension felt to be related to fentanyl--improved with narcan. He was started on IVF and IV antibiotics due to sepsis and acute renal failure. Right foot not felt to be salvageable per Dr. Bridgett Larsson and Dr. Sharol Given. 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 Sharol Given. Therapy evaluations done today and patient felt to be good CIR candidate."  During today's visit, Craig Bentley denied experiencing any cognitive difficulties for any reason. He has noticed increased sedation and slurred speech that he attributes to medication side effects.   From an emotional standpoint, Craig Bentley was purportedly diagnosed with Schizophrenia around age 77 or 61. At that time he was suffering from auditory and visual hallucinations, and he also had suicide attempts. These symptoms are reportedly well-controlled via medications. These are monitored and prescribed by a psychiatrist at [facility]. He mentioned that he also participates in counseling but he was unable to elaborate on where this takes place, but he eluded that it may occur at the "rest home" where his currently resides.   He described his current mood as "better" as he was feeling somewhat distressed at being unable to perform his daily routines since being hospitalized.  No adjustment issues endorsed. Suicidal/homicidal ideation, plan or  intent was denied. No manic or hypomanic episodes were reported. No major behavioral or personality changes were endorsed.   Craig Bentley feels that he is making progress in therapy, though excessive daytime sleepiness has reportedly caused him to miss some of his therapies. This has not been verified by the rehab staff. He described the rehab staff as "good" and he had no complaints. He presently resides at "[facility]" with a roommate. He is happy with this situation. He was unable to elaborate on whether this is an assisted living situation.   PROCEDURES: [2 units 40981] Diagnostic clinical interview  Review of available records Montreal Cognitive Assessment (short-form)  MENTAL STATUS: Craig Bentley mental status exam score of 11/22 is below the suggested cutoff used to indicate frank cognitive impairment and possible dementia. Immediate learning was reduced and delayed free recall was severely impaired; recognition was not beneficial.  Simple attention and working memory were intact, though response inhibition was impaired. Letter fluency and processing speed were impaired, as was abstraction and repetition. Patient was generally oriented to person, place, time, and situation.    IMPRESSION: Overall, Craig Bentley denied experiencing any cognitive difficulties, though brief screen indicated severe cognitive deficits that would be considered at the level of dementia. Emotionally, he has a longstanding history of treatment for Schizophrenia and he said that his mental health symptoms have been generally well-controlled. And though he was feeling somewhat distressed lately these symptoms have purportedly abated.   Craig Bentley performance is provisionally consistent with a diagnosis of Major Neurocognitive Disorder (i.e., dementia). Given the limited information available, my preliminary thoughts on etiology is that longstanding Schizophrenia could be causing the cognitive issues, though multiple  other etiologies need to be ruled out. As such, a standard dementia evaluation would be valuable to include metabolic lab work and neuroimaging. Neurology could also be consulted. I do not feel that neuropsychology needs to formally follow-up with Craig Bentley for the remainder of this admission unless requested by the patient or rehab staff.   DIAGNOSES:  Major Neurocognitive Disorder (i.e., dementia), etiology unclear Schizophrenia    Rutha Bouchard, Psy.D., ABN Board-Certified Clinical Neuropsychologist

## 2015-09-03 ENCOUNTER — Inpatient Hospital Stay (HOSPITAL_COMMUNITY): Payer: Self-pay | Admitting: Occupational Therapy

## 2015-09-03 ENCOUNTER — Inpatient Hospital Stay (HOSPITAL_COMMUNITY): Payer: Self-pay | Admitting: Physical Therapy

## 2015-09-03 ENCOUNTER — Inpatient Hospital Stay (HOSPITAL_COMMUNITY): Payer: Medicaid Other | Admitting: Physical Therapy

## 2015-09-03 DIAGNOSIS — R918 Other nonspecific abnormal finding of lung field: Secondary | ICD-10-CM

## 2015-09-03 LAB — GLUCOSE, CAPILLARY
GLUCOSE-CAPILLARY: 110 mg/dL — AB (ref 65–99)
Glucose-Capillary: 145 mg/dL — ABNORMAL HIGH (ref 65–99)
Glucose-Capillary: 170 mg/dL — ABNORMAL HIGH (ref 65–99)

## 2015-09-03 NOTE — Progress Notes (Signed)
Occupational Therapy Session Note  Patient Details  Name: Craig Bentley MRN: [id number] Date of Birth: 23-Jan-1956  Today's Date: 09/03/2015 OT Individual Time: 3646-8032 OT Individual Time Calculation (min): 45 min    Short Term Goals: Week 1:  OT Short Term Goal 1 (Week 1): Pt will transfer to toilet with squat pivot with S. OT Short Term Goal 2 (Week 1): Pt will be able to tolerate standing for at least 2 minutes to increase Independence with toileting. OT Short Term Goal 3 (Week 1): Pt will dress with set up from bed level.  Skilled Therapeutic Interventions/Progress Updates:    1:1 Pt declined bathing and dressing today. Focused on stand pivot transfers to the toilet. PT able to perform transfers with grab bar with supervision and A for proper setup of the w/c (removal of Le rests and put on brakes).  Steady A for balance during clothing management. Pt able to tolerate standing long enough to complete tasks. Pt with dry skin around bottom and saccum requiring A to apply cream. Pt continues to require A for ace wrapping of LE. Pt able to perform lower body dressing with extra time with supervision. Pt performed w/c mobility in an obstacle in the hallway and propelling self around the unit with brief rest breaks - addressing coordination, UB strengthening and endurance.   Therapy Documentation Precautions:  Precautions Precautions: Fall Precaution Comments: wound vac Restrictions Weight Bearing Restrictions: Yes RLE Weight Bearing: Non weight bearing   Pain: No reports of pain ADL: ADL ADL Comments: Refer to functional navigator  See Function Navigator for Current Functional Status.   Therapy/Group: Individual Therapy  Willeen Cass [facility] 09/03/2015, 8:13 PM

## 2015-09-03 NOTE — Progress Notes (Signed)
Inpatient [place] Individual Statement of Services  Patient Name:  Craig Bentley  Date:  09/03/2015  Welcome to the [facility].  Our goal is to provide you with an individualized program based on your diagnosis and situation, designed to meet your specific needs.  With this comprehensive rehabilitation program, you will be expected to participate in at least 3 hours of rehabilitation therapies Monday-Friday, with modified therapy programming on the weekends.  Your rehabilitation program will include the following services:  Physical Therapy (PT), Occupational Therapy (OT), 24 hour per day rehabilitation nursing, Therapeutic Recreaction (TR), Neuropsychology, Case Management (Social Worker), Rehabilitation Medicine, Nutrition Services and Pharmacy Services  Weekly team conferences will be held on Wednesdays to discuss your progress.  Your Social Worker will talk with you frequently to get your input and to update you on team discussions.  Team conferences with you and your family in attendance may also be held.  Expected length of stay:  10 to 12 days  Overall anticipated outcome:  Modified independent from the wheelchair; Supervision for bed to chair transfers; ambulation; toilet transfers.  Minimal assistance for car transfers.  Depending on your progress and recovery, your program may change. Your Social Worker will coordinate services and will keep you informed of any changes. Your Social Worker's name and contact numbers are listed  below.  The following services may also be recommended but are not provided by the [facility] will be made to provide these services after discharge if needed.  Arrangements include referral to agencies that provide these services.  Your insurance has been verified to be:  Medicaid Your primary  doctor is:  Dr. Mabeline Caras  Pertinent information will be shared with your doctor and your insurance company.  Social Worker:  Alfonse Alpers, LCSW  [phone] or [phone]  Information discussed with and copy given to patient by: Trey Sailors, 09/03/2015, 1:28 PM

## 2015-09-03 NOTE — Progress Notes (Signed)
Physical Therapy Session Note  Patient Details  Name: Craig Bentley MRN: [id number] Date of Birth: Oct 16, 1955  Today's Date: 09/03/2015 PT Individual Time: 3276-1470 PT Individual Time Calculation (min): 73 min   Short Term Goals: Week 1:  PT Short Term Goal 1 (Week 1): STG =LTG due to ELOS.   Skilled Therapeutic Interventions/Progress Updates:    Pt received in bed & agreeable to PT, noting 4/10 pain in RLE with movement but denying pain medication. Pt able to roll in bed with supervision & bed rails, and transfer supine>sitting with supervision & bed rails. PT provided total A for re-wrapping RLE residual limb. Pt able to remove leg rests & lock w/c brakes to assist with w/c set up with supervision and cuing. Pt completed squat pivot bed>w/c with steady A. Pt requested to go outside & propelled w/c off of unit with supervision, successfully backing onto elevator without cuing or assistance, for BUE strengthening & endurance training. While outside, reviewed need for pt to perform w/c pushups every 20 minutes for pressure relieving; pt unable to recall education from day prior. Pt performed 2x10 w/c pushups. Back on unit pt completed gait training 10 ft + 10 ft with RW & min A. Pt continues to look down during activity & hop on LLE instead of pushing with BUE's to unload LLE. Pt unable to correct with maximum cuing. Pt also impulsive with stand>sit transfers as he will attempt to sit before being close enough to surface thus requiring maximum multimodal cuing for safety. Provided pt with HEP & pt performed the following RLE exercises: long arc quads, glute sets, isometric hip extension & sidelying hip extensions. Pt required multimodal cuing for technique for each exercise, but able to transfer supine<>sitting & roll on mat with supervision. Throughout session PT provided supervision & education for w/c parts management; pt continues to require supervision for sequencing & technique for w/c set up for  transfers and steady/supervision A for squat pivot transfers. At end of session pt left in w/c in room with all needs within reach.   Therapy Documentation Precautions:  Precautions Precautions: Fall Precaution Comments: wound vac Restrictions Weight Bearing Restrictions: Yes RLE Weight Bearing: Non weight bearing   See Function Navigator for Current Functional Status.   Therapy/Group: Individual Therapy  Waunita Schooner 09/03/2015, 5:12 PM

## 2015-09-03 NOTE — Progress Notes (Signed)
Social Work Patient ID: Taim Wurm, male   DOB: [birth year], 60 y.o.   MRN: [id number]  CSW spoke with pt to update him on team conference.  Pt is pleased to learn he would be going home on 09-09-15.  Team plans to do a home evaluation with pt to see if he will be safe in his environment.  CSW spoke to Wells Guiles, Librarian, academic, at [facility] and they will be expecting team tomorrow.  She explained that they can provide supervision to pt for toileting and bathing up to total care.  They can also provide transportation home on 09-09-15 once CSW calls them for d/c.  CSW will order DME and notify [facility] of pt's d/c.

## 2015-09-03 NOTE — Progress Notes (Signed)
[facility] PHYSICAL MEDICINE & REHABILITATION     PROGRESS NOTE  Subjective/Complaints:  Pt laying in bed.  He slept well overnight.  He asks if I am comfortable with the discharge date that has been set. Per nursing, VAC removed yesterday due to malfunction and lack of drainage.   ROS: Denies CP, SOB, N/V/D.  Objective: Vital Signs: Blood pressure 132/62, pulse 90, temperature 98.2 F (36.8 C), temperature source Oral, resp. rate 16, height '5\' 11"'$  (1.803 m), weight 70.7 kg (155 lb 13.8 oz), SpO2 99 %. Dg Chest Port 1 View  09/02/2015  CLINICAL DATA:  Leukocytosis. History of hypertension, diabetes and smoking. EXAM: PORTABLE CHEST 1 VIEW COMPARISON:  08/29/2015 FINDINGS: Interstitial type opacities noted in the left lower lung are without change from the prior study. There are no new areas of lung opacification. No pleural effusion or pneumothorax. Heart, mediastinum and hila are unremarkable. Bony thorax is intact. IMPRESSION: 1. Left lower lung opacity noted on prior studies without significant change. This is likely chronic. It is similar to the appearance of the chest radiograph dated 09/16/2013. Remainder of the lungs is clear. Electronically Signed   By: Lajean Manes M.D.   On: 09/02/2015 11:23    Recent Labs  09/02/15 0644  WBC 11.9*  HGB 9.0*  HCT 29.2*  PLT 408*    Recent Labs  09/02/15 0644  NA 139  K 3.7  CL 104  GLUCOSE 134*  BUN 10  CREATININE 0.57*  CALCIUM 9.0   CBG (last 3)   Recent Labs  09/01/15 2039 09/02/15 0636 09/02/15 1134  GLUCAP 152* 137* 144*    Wt Readings from Last 3 Encounters:  09/03/15 70.7 kg (155 lb 13.8 oz)  08/28/15 69.6 kg (153 lb 7 oz)  05/16/15 78.926 kg (174 lb)    Physical Exam:  BP 132/62 mmHg  Pulse 90  Temp(Src) 98.2 F (36.8 C) (Oral)  Resp 16  Ht '5\' 11"'$  (1.803 m)  Wt 70.7 kg (155 lb 13.8 oz)  BMI 21.75 kg/m2  SpO2 99% Constitutional: He appears well-developed and well-nourished.  HENT: Normocephalic and  atraumatic.  Eyes: Conjunctivae and EOM are normal. No scleral icterus.  Cardiovascular: Normal rate and regular rhythm.  Respiratory: Bilateral clear to ausculation. Effort normal. No stridor. He exhibits no tenderness.  GI: Soft. Bowel sounds are normal. He exhibits no distension. There is no tenderness.  Musculoskeletal: He exhibits edema. R-BKA.  Neurological: He is alert and oriented x3.  Able to follow basic commands without difficulty.  Motor: 4+/5 throughout B/l and LLE: RLE 4+/5 HF Skin: Skin is warm and dry.   Staples with central area of dehiscence and mild drainage  Dry skin to sacral area, no breakdown Psychiatric: He has a normal mood and affect. His behavior is normal.   Assessment/Plan: 1. Functional deficits secondary to PAD, s/p right BKA which require 3+ hours per day of interdisciplinary therapy in a comprehensive inpatient rehab setting. Physiatrist is providing close team supervision and 24 hour management of active medical problems listed below. Physiatrist and rehab team continue to assess barriers to discharge/monitor patient progress toward functional and medical goals.  Function:  Bathing Bathing position   Position: Wheelchair/chair at sink  Bathing parts Body parts bathed by patient: Right arm, Left arm, Chest, Abdomen, Right upper leg, Left upper leg Body parts bathed by helper: Left lower leg, Back  Bathing assist Assist Level: Touching or steadying assistance(Pt > 75%)      Upper Body Dressing/Undressing Upper  body dressing   What is the patient wearing?: Pull over shirt/dress     Pull over shirt/dress - Perfomed by patient: Thread/unthread right sleeve, Thread/unthread left sleeve, Put head through opening, Pull shirt over trunk          Upper body assist Assist Level: Set up      Lower Body Dressing/Undressing Lower body dressing   What is the patient wearing?: Pants, Non-skid slipper socks     Pants- Performed by patient:  Thread/unthread right pants leg, Thread/unthread left pants leg, Pull pants up/down, Fasten/unfasten pants Pants- Performed by helper: Thread/unthread right pants leg Non-skid slipper socks- Performed by patient: Don/doff left sock Non-skid slipper socks- Performed by helper: Don/doff left sock                  Lower body assist Assist for lower body dressing: Touching or steadying assistance (Pt > 75%)      Toileting Toileting   Toileting steps completed by patient: Adjust clothing prior to toileting, Performs perineal hygiene, Adjust clothing after toileting Toileting steps completed by helper: Adjust clothing prior to toileting, Performs perineal hygiene, Adjust clothing after toileting (per Braulio Bosch, NT report) Toileting Assistive Devices: Grab bar or Designer, multimedia Assist level: Touching or steadying assistance (Pt.75%)   Transfers Chair/bed transfer   Chair/bed transfer method: Squat pivot Chair/bed transfer assist level: Touching or steadying assistance (Pt > 75%) Chair/bed transfer assistive device: Armrests     Locomotion Ambulation Ambulation activity did not occur: Refused   Max distance: 13 ft  Assist level: Touching or steadying assistance (Pt > 75%)   Wheelchair   Type: Manual (supervision for controlled environment; min A for ramp) Max wheelchair distance: 300 ft Assist Level: Touching or steadying assistance (Pt > 75%), Supervision or verbal cues  Cognition Comprehension Comprehension assist level: Follows basic conversation/direction with no assist  Expression Expression assist level: Expresses basic needs/ideas: With no assist  Social Interaction Social Interaction assist level: Interacts appropriately with others with medication or extra time (anti-anxiety, antidepressant).  Problem Solving Problem solving assist level: Solves basic 90% of the time/requires cueing < 10% of the time  Memory Memory assist level: Recognizes or recalls 75 -  89% of the time/requires cueing 10 - 24% of the time    Medical Problem List and Plan: 1. Functional and mobility deficits secondary to PAD, s/p right BKA on 7/5  Cont CIR  2. DVT Prophylaxis/Anticoagulation: lovenox '40mg'$  sq daily 3. Pain Management: pt reports good pain control at present.  tramadol/oxycodone '5mg'$  q4-6 prn.  robaxin '500mg'$  prn muscle spasms.  gabapentin for peripheral neuropathic pain 4. Mood:  -haldol, aricept, clozapine 5. Neuropsych: This patient is capable of making decisions on his own behalf. 6. Skin/Wound Care: Vac d/ced on 7/11 to amputation site 7. Fluids/Electrolytes/Nutrition: encourage po, check admission labs 8. Acute Renal Failure:  Resolved on 7/7 9. 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 Essential HTN - metoprolol '25mg'$  bid 11. DM2---diet controlled.  Cont to monitor, will consider medications if necessary  check cbg's AC and HS. SSI as appropriate 12. Acute encephalopathy--?resolved 13. Fever: Afebrile  WBCs 11.9 on 7/11  UA with some bacteria, Ucx insignificant growth  CXR 7/7 reviewed, suggesting PNA, repeat CXR reviewed with stable opacity, likely chronic  Encourage IS 14. ABLA  Hb 9.0 on 7/11  Cont to monitor  LOS (Days) 6 A FACE TO FACE EVALUATION WAS PERFORMED  Quavis Klutz Lorie Phenix 09/03/2015 8:57 AM

## 2015-09-03 NOTE — Progress Notes (Signed)
   09/03/15 1725  What Happened  Was fall witnessed? No  Was patient injured? No  Patient found on floor  Found by Staff-comment Dorian Heckle, NT)  Stated prior activity to/from bed, chair, or stretcher  Follow Up  MD notified Algis Liming  Time MD notified [phone]  Family notified Yes-comment  Time family notified [phone]  Additional tests No  Simple treatment Ice  Progress note created (see row info) Yes  Adult Fall Risk Assessment  Risk Factor Category (scoring not indicated) Fall has occurred during this admission (document High fall risk)  Patient's Fall Risk High Fall Risk (>13 points)  Adult Fall Risk Interventions  Required Bundle Interventions *See Row Information* High fall risk - low, moderate, and high requirements implemented  Additional Interventions Individualized elimination schedule;Lap belt while in chair/wheelchair;Use of appropriate toileting equipment (bedpan, BSC, etc.)  Screening for Fall Injury Risk  Risk For Fall Injury- See Row Information  F  Vitals  Temp 98.6 F (37 C)  Temp Source Oral  BP 140/64 mmHg  BP Location Right Arm  BP Method Automatic  Patient Position (if appropriate) Sitting  Pulse Rate 90  Pulse Rate Source Dinamap  Resp 18  Oxygen Therapy  SpO2 100 %  O2 Device Room Air  PCA/Epidural/Spinal Assessment  Respiratory Pattern Regular;Unlabored  Neurological  Neuro (WDL) X  Level of Consciousness Alert  Orientation Level Oriented X4  Cognition Appropriate at baseline  Speech Clear (mumbles at time r/t no teeth)  Neuro Symptoms None  Musculoskeletal  Musculoskeletal (WDL) X  Assistive Device Wheelchair  Generalized Weakness Yes  Weight Bearing Restrictions Yes  RLE Weight Bearing NWB  Integumentary  Integumentary (WDL) X  RN Assisting with Skin Assessment on Admission [name], RN  Skin Color Appropriate for ethnicity  Skin Condition Dry;Flaky  Skin Integrity Amputation;MSAD;Surgical Incision (see LDA)  Amputation Location  Leg  Amputation Location Orientation Right  Amputation Intervention Other (Comment)  Moisture Associated Skin Damage Location Sacrum  Moisture Associated Skin Damage Orientation Mid  Moisture Associated Skin Damage Intervention Other (Comment)

## 2015-09-03 NOTE — Progress Notes (Signed)
Physical Therapy Session Note  Patient Details  Name: Craig Bentley MRN: [id number] Date of Birth: [birth year]  Today's Date: 09/03/2015 PT Individual Time: 0908-1018 PT Individual Time Calculation (min): 70 min   Short Term Goals: Week 1:  PT Short Term Goal 1 (Week 1): STG =LTG due to ELOS.   Skilled Therapeutic Interventions/Progress Updates:     Patient received supine in bed and agreeable to PT.  Patient performed WC mobility on uneven sidewalk for 161f, and hadicap access ramp x 75 ft. PT provided supervision A with mod cues for improved control of WC to accommodate for unlevel surface and proper forward lean to prevent posterior tipping on ramp. WC mobility also perform on carpeted surface for 753fx 2 and tile floor in controlled environment for [id number] cues from PT on carpet and tile floor for obstacle navigation and improve seated posture.  Squat pivot transfers to various seat heights with supervision A from PT x 8 throughout treatment with mod cues for improved UE positioning.    Therex on mat table Supine SAQ BLE x10,  SLR BLE x10,  Sidelying hip abduction x 10  Bridge with press through R LE and L thigh. x10  Mod cues for improved technique including decreased speed to enhance strengthening aspects of movement and improve control.   Patient returned to room and [facility].    Therapy Documentation Precautions:  Precautions Precautions: Fall Precaution Comments: wound vac Restrictions Weight Bearing Restrictions: Yes RLE Weight Bearing: Non weight bearing   See Function Navigator for Current Functional Status.   Therapy/Group: Individual Therapy  AustiLorie Phenix/2017, 12:15 PM

## 2015-09-03 NOTE — Patient Care Conference (Signed)
Inpatient RehabilitationTeam Conference and Plan of Care Update Date: 09/02/2015   Time: 10:30 AM    Patient Name: Craig Bentley      Medical Record Number: [id number]  Date of Birth: 1955/09/26 Sex: Male         Room/Bed: 4W03C/4W03C-01 Payor Info: Payor: MEDICAID [place] / Plan: MEDICAID [place] ACCESS / Product Type: *No Product type* /    Admitting Diagnosis: r bka  Admit Date/Time:  08/28/2015  3:46 PM Admission Comments: No comment available   Primary Diagnosis:  Status post below knee amputation of right lower extremity (HCC) Principal Problem: Status post below knee amputation of right lower extremity [facility])  Patient Active Problem List   Diagnosis Date Noted  . Opacity of lung on imaging study   . Acute blood loss anemia   . Fever   . SIRS (systemic inflammatory response syndrome) (HCC)   . Leukocytosis   . Status post below knee amputation of right lower extremity (HCC) 08/28/2015  . 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HCC) 08/23/2015  . Sepsis (HCC) 08/21/2015  . ARF (acute renal failure) (HCC) 08/21/2015  . Acute encephalopathy 08/21/2015  . Post-operative state   . Respiratory failure (HCC)   . Surgery, elective   . Anxiety   . Schizophrenia (HCC)   . Atherosclerosis of extremity with gangrene (HCC)   . Hyperglycemia   . Preoperative cardiovascular examination 04/11/2015  . PVD (peripheral vascular disease) (HCC) 04/10/2015  . Absolute anemia   . Diabetic foot infection (HCC) 04/02/2015  . Right foot pain 04/02/2015  . Neutrophilic leukocytosis 04/02/2015  . Esophageal abnormality   . HCAP (healthcare-associated pneumonia)   . COPD exacerbation (HCC)   . Dyspnea   . Respiratory distress   . Sepsis due to pneumonia (HCC) 02/14/2015  . Acute respiratory failure with hypoxia (HCC) 02/13/2015  . Diabetes mellitus with neurologic complication, without long-term current use of insulin (HCC) 07/20/2011  . Essential hypertension   . COPD (chronic obstructive pulmonary disease) [facility])      Expected Discharge Date: Expected Discharge Date: 09/09/15  Team Members Present: Physician leading conference: Dr. Maryla Morrow Social Worker Present: Staci Acosta, LCSW Nurse Present: Carmie End, RN PT Present: Aleda Grana, PT OT Present: Primitivo Gauze, OT     Current Status/Progress Goal Weekly Team Focus  Medical   Functional and mobility deficits secondary to PAD, s/p right BKA on 7/5  Improve fevers, leukocytosis, ABLA  See above   Bowel/Bladder   continent using urinal, incontinent @ times, last bm 7/11  continent, timed toiletting  monitor b&b pattern   Swallow/Nutrition/ Hydration             ADL's   min  A with LB dressing and toileting, touching assisting with transfers  mod I with basic ADLs, supervision with dynamic standing balance, Supervision with toilet transfers  ADL retraining, functional mobility training, strengthening, pt education   Mobility   min A for ambulation for 10-15 ft with RW, min/mod A for sit<>stand transfers, supervision for w/c mobility  min A car transfer, mod I bed mobility, supervision bed<>chair transfer, supervision standing balance, ambulate 50 ft with LRAD & supervision, mod I w/c mobility  endurance, pt education, w/c mobility, ambulation   Communication             Safety/Cognition/ Behavioral Observations            Pain   ultram q6 Prn, Oxy IR q3 prn, occassionaly will ask for pain med  minimal pain  Skin   R stump, Lfoot callus (eucerin), peeling skin to perineum  remain free of breakdown & infection  monitor     Rehab Goals Patient on target to meet rehab goals: Yes Rehab Goals Revised: none *See Care Plan and progress notes for long and short-term goals.  Barriers to Discharge: Leukocytosis, low grade fevers, ABLA    Possible Resolutions to Barriers:  Follow labs, CXR    Discharge Planning/Teaching Needs:  Pt wants to return to [facility] at d/c.  Therapists to do a home eval to make sure pt is safe in  his environment/set-up at ALF.  Lurena Joiner, supervisor at [facility], stated that they can provide more assistance to pt as needed, up to total care.  none - pt at ALF.   Team Discussion:  Pt with anemia and low grade temperatures.  MD may start antibiotics for possible pneumonia.  Pt is not clearing his foot when hopping with PT, so will recommend modified independent w/c level.  Pt with OT is min assist with balance and wanting to do a home eval/outing.  Per RN, wound VAC should be d/c'd on 09-03-15.  Revisions to Treatment Plan:  none   Continued Need for Acute Rehabilitation Level of Care: The patient requires daily medical management by a physician with specialized training in physical medicine and rehabilitation for the following conditions: Daily direction of a multidisciplinary physical rehabilitation program to ensure safe treatment while eliciting the highest outcome that is of practical value to the patient.: Yes Daily medical management of patient stability for increased activity during participation in an intensive rehabilitation regime.: Yes Daily analysis of laboratory values and/or radiology reports with any subsequent need for medication adjustment of medical intervention for : Post surgical problems;Wound care problems;Other  Dymphna Wadley, [place] 09/03/2015, 1:54 PM

## 2015-09-04 ENCOUNTER — Inpatient Hospital Stay (HOSPITAL_COMMUNITY): Payer: Self-pay | Admitting: Physical Therapy

## 2015-09-04 ENCOUNTER — Inpatient Hospital Stay (HOSPITAL_COMMUNITY): Payer: Medicaid Other | Admitting: *Deleted

## 2015-09-04 ENCOUNTER — Inpatient Hospital Stay (HOSPITAL_COMMUNITY): Payer: Self-pay | Admitting: Occupational Therapy

## 2015-09-04 DIAGNOSIS — T8131XA Disruption of external operation (surgical) wound, not elsewhere classified, initial encounter: Secondary | ICD-10-CM

## 2015-09-04 DIAGNOSIS — R509 Fever, unspecified: Secondary | ICD-10-CM | POA: Insufficient documentation

## 2015-09-04 LAB — GLUCOSE, CAPILLARY
GLUCOSE-CAPILLARY: 148 mg/dL — AB (ref 65–99)
GLUCOSE-CAPILLARY: 107 mg/dL — AB (ref 65–99)
Glucose-Capillary: 180 mg/dL — ABNORMAL HIGH (ref 65–99)
Glucose-Capillary: 115 mg/dL — ABNORMAL HIGH (ref 65–99)
Glucose-Capillary: 120 mg/dL — ABNORMAL HIGH (ref 65–99)

## 2015-09-04 LAB — CBC WITH DIFFERENTIAL/PLATELET
BASOS ABS: 0 10*3/uL (ref 0.0–0.1)
BASOS PCT: 0 %
EOS ABS: 0 10*3/uL (ref 0.0–0.7)
EOS PCT: 0 %
HCT: 32.2 % — ABNORMAL LOW (ref 39.0–52.0)
Hemoglobin: 9.7 g/dL — ABNORMAL LOW (ref 13.0–17.0)
LYMPHS PCT: 20 %
Lymphs Abs: 2.5 10*3/uL (ref 0.7–4.0)
MCH: 26 pg (ref 26.0–34.0)
MCHC: 30.1 g/dL (ref 30.0–36.0)
MCV: 86.3 fL (ref 78.0–100.0)
MONO ABS: 0.6 10*3/uL (ref 0.1–1.0)
Monocytes Relative: 5 %
Neutro Abs: 9.2 10*3/uL — ABNORMAL HIGH (ref 1.7–7.7)
Neutrophils Relative %: 75 %
PLATELETS: 450 10*3/uL — AB (ref 150–400)
RBC: 3.73 MIL/uL — ABNORMAL LOW (ref 4.22–5.81)
RDW: 14.9 % (ref 11.5–15.5)
WBC: 12.3 10*3/uL — AB (ref 4.0–10.5)

## 2015-09-04 LAB — BASIC METABOLIC PANEL
Anion gap: 9 (ref 5–15)
BUN: 9 mg/dL (ref 6–20)
CALCIUM: 9.3 mg/dL (ref 8.9–10.3)
CO2: 30 mmol/L (ref 22–32)
CREATININE: 0.66 mg/dL (ref 0.61–1.24)
Chloride: 98 mmol/L — ABNORMAL LOW (ref 101–111)
GFR calc Af Amer: >60 mL/min (ref >60)
GLUCOSE: 207 mg/dL — AB (ref 65–99)
Potassium: 3.6 mmol/L (ref 3.5–5.1)
SODIUM: 137 mmol/L (ref 135–145)

## 2015-09-04 MED ORDER — MUPIROCIN 2 % EX OINT
TOPICAL_OINTMENT | Freq: Every day | CUTANEOUS | Status: DC
  Administered 2015-09-05 – 2015-09-09 (×5): via TOPICAL
  Filled 2015-09-04: qty 22

## 2015-09-04 MED ORDER — CEPHALEXIN 250 MG PO CAPS
250.0000 mg | ORAL_CAPSULE | Freq: Three times a day (TID) | ORAL | Status: DC
  Administered 2015-09-04 – 2015-09-09 (×16): 250 mg via ORAL
  Filled 2015-09-04 (×16): qty 1

## 2015-09-04 NOTE — Progress Notes (Signed)
Patient ID: Craig Bentley, male   DOB: Sep 29, 1955, 60 y.o.   MRN: [id number] I came by to see [name] right BKA stump.  He is 8 days post surgery by Dr. Sharol Given.  I examined his stump and the end looks like there is a slight wound dehiscence.  There is no evidence of infection, but I think oral antibiotics are ok.  He does need local wound care to the end of his stump with a very small, thin layer of bactroban ointment daily followed by a new dry dressing.  Will likely heal better once a stump shrinker is applied.

## 2015-09-04 NOTE — Progress Notes (Signed)
[facility] PHYSICAL MEDICINE & REHABILITATION     PROGRESS NOTE  Subjective/Complaints:  Pt laying in bed. He asks if he will be leaving soon.    ROS: Denies CP, SOB, N/V/D.  Objective: Vital Signs: Blood pressure 113/57, pulse 86, temperature 99 F (37.2 C), temperature source Oral, resp. rate 18, height '5\' 11"'$  (1.803 m), weight 70.7 kg (155 lb 13.8 oz), SpO2 97 %. Dg Chest Port 1 View  09/02/2015  CLINICAL DATA:  Leukocytosis. History of hypertension, diabetes and smoking. EXAM: PORTABLE CHEST 1 VIEW COMPARISON:  08/29/2015 FINDINGS: Interstitial type opacities noted in the left lower lung are without change from the prior study. There are no new areas of lung opacification. No pleural effusion or pneumothorax. Heart, mediastinum and hila are unremarkable. Bony thorax is intact. IMPRESSION: 1. Left lower lung opacity noted on prior studies without significant change. This is likely chronic. It is similar to the appearance of the chest radiograph dated 09/16/2013. Remainder of the lungs is clear. Electronically Signed   By: Lajean Manes M.D.   On: 09/02/2015 11:23    Recent Labs  09/02/15 0644 09/04/15 0847  WBC 11.9* 12.3*  HGB 9.0* 9.7*  HCT 29.2* 32.2*  PLT 408* 450*    Recent Labs  09/02/15 0644 09/04/15 0847  NA 139 137  K 3.7 3.6  CL 104 98*  GLUCOSE 134* 207*  BUN 10 9  CREATININE 0.57* 0.66  CALCIUM 9.0 9.3   CBG (last 3)   Recent Labs  09/02/15 1641 09/02/15 2100 09/03/15 0639  GLUCAP 145* 170* 110*    Wt Readings from Last 3 Encounters:  09/03/15 70.7 kg (155 lb 13.8 oz)  08/28/15 69.6 kg (153 lb 7 oz)  05/16/15 78.926 kg (174 lb)    Physical Exam:  BP 113/57 mmHg  Pulse 86  Temp(Src) 99 F (37.2 C) (Oral)  Resp 18  Ht '5\' 11"'$  (1.803 m)  Wt 70.7 kg (155 lb 13.8 oz)  BMI 21.75 kg/m2  SpO2 97% Constitutional: He appears well-developed and well-nourished.  HENT: Normocephalic and atraumatic.  Eyes: Conjunctivae and EOM are normal. No scleral  icterus.  Cardiovascular: Normal rate and regular rhythm.  Respiratory: Bilateral clear to ausculation. Effort normal. No stridor. He exhibits no tenderness.  GI: Soft. Bowel sounds are normal. He exhibits no distension. There is no tenderness.  Musculoskeletal: He exhibits edema. R-BKA.  Neurological: He is alert and oriented x3.  Able to follow basic commands without difficulty.  Motor: 4+/5 throughout B/l and LLE: RLE 4+/5 HF Skin: Skin is warm and dry.   Staples with central area of dehiscence (slightly increased) and mild drainage  Dry skin to sacral area, no breakdown Psychiatric: He has a normal mood and affect. His behavior is normal.   Assessment/Plan: 1. Functional deficits secondary to PAD, s/p right BKA which require 3+ hours per day of interdisciplinary therapy in a comprehensive inpatient rehab setting. Physiatrist is providing close team supervision and 24 hour management of active medical problems listed below. Physiatrist and rehab team continue to assess barriers to discharge/monitor patient progress toward functional and medical goals.  Function:  Bathing Bathing position   Position: Wheelchair/chair at sink  Bathing parts Body parts bathed by patient: Right arm, Left arm, Chest, Abdomen, Right upper leg, Left upper leg Body parts bathed by helper: Left lower leg, Back  Bathing assist Assist Level: Touching or steadying assistance(Pt > 75%)      Upper Body Dressing/Undressing Upper body dressing   What is  the patient wearing?: Pull over shirt/dress     Pull over shirt/dress - Perfomed by patient: Thread/unthread right sleeve, Thread/unthread left sleeve, Put head through opening, Pull shirt over trunk          Upper body assist Assist Level: Set up      Lower Body Dressing/Undressing Lower body dressing   What is the patient wearing?: Pants, Non-skid slipper socks     Pants- Performed by patient: Thread/unthread right pants leg, Thread/unthread left  pants leg, Pull pants up/down, Fasten/unfasten pants Pants- Performed by helper: Thread/unthread right pants leg Non-skid slipper socks- Performed by patient: Don/doff left sock Non-skid slipper socks- Performed by helper: Don/doff left sock                  Lower body assist Assist for lower body dressing: Touching or steadying assistance (Pt > 75%)      Toileting Toileting   Toileting steps completed by patient: Adjust clothing prior to toileting Toileting steps completed by helper: Performs perineal hygiene, Adjust clothing after toileting Toileting Assistive Devices: Grab bar or rail  Toileting assist Assist level: Touching or steadying assistance (Pt.75%)   Transfers Chair/bed transfer   Chair/bed transfer method: Squat pivot Chair/bed transfer assist level: Touching or steadying assistance (Pt > 75%) Chair/bed transfer assistive device: Armrests, Medical sales representative Ambulation activity did not occur: Refused   Max distance: 10 ft Assist level: Touching or steadying assistance (Pt > 75%)   Wheelchair   Type: Manual Max wheelchair distance: 300 ft Assist Level: Supervision or verbal cues  Cognition Comprehension Comprehension assist level: Follows basic conversation/direction with no assist  Expression Expression assist level: Expresses basic needs/ideas: With no assist  Social Interaction Social Interaction assist level: Interacts appropriately with others with medication or extra time (anti-anxiety, antidepressant).  Problem Solving Problem solving assist level: Solves basic 90% of the time/requires cueing < 10% of the time  Memory Memory assist level: Recognizes or recalls 75 - 89% of the time/requires cueing 10 - 24% of the time    Medical Problem List and Plan: 1. Functional and mobility deficits secondary to PAD, s/p right BKA on 7/5  Cont CIR  2. DVT Prophylaxis/Anticoagulation: lovenox '40mg'$  sq daily 3. Pain Management: pt reports good pain  control at present.  tramadol/oxycodone '5mg'$  q4-6 prn.  robaxin '500mg'$  prn muscle spasms.  gabapentin for peripheral neuropathic pain 4. Mood:  -haldol, aricept, clozapine 5. Neuropsych: This patient is capable of making decisions on his own behalf. 6. Skin/Wound Care: Vac d/ced on 7/11 to amputation site.   Wound dehiscence, will contact surgery.  7. Fluids/Electrolytes/Nutrition: encourage po, check admission labs 8. Acute Renal Failure:  Resolved on 7/7 9. 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 Essential HTN - metoprolol '25mg'$  bid 11. DM2---diet controlled.  Cont to monitor, will consider medications if necessary  check cbg's AC and HS. SSI as appropriate 12. Acute encephalopathy--?resolved 13. Fever: Afebrile at present  WBCs 12.3 on 7/13  UA with some bacteria, Ucx insignificant growth  CXR 7/7 reviewed, suggesting PNA, repeat CXR reviewed with stable opacity, likely chronic  Encourage IS 14. ABLA  Hb 9.7 on 7/13  Cont to monitor  LOS (Days) 7 A FACE TO FACE EVALUATION WAS PERFORMED  Jashan Cotten Lorie Phenix 09/04/2015 10:40 AM

## 2015-09-04 NOTE — Progress Notes (Signed)
Physical Therapy Session Note  Patient Details  Name: Craig Bentley MRN: [id number] Date of Birth: 1955-07-08  Today's Date: 09/04/2015 PT Individual Time: 0801-0845 PT Individual Time Calculation (min): 44 min   Short Term Goals: Week 1:  PT Short Term Goal 1 (Week 1): STG =LTG due to ELOS.   Skilled Therapeutic Interventions/Progress Updates:    Patient received EOB and agreeable to PT. PT re-wrapped Residual limb.  Stand pivot transfer with min A from PT. Multi modal cues for WC postitioning and increased push through BUE  WC mobillity for 159f with supervision A from PT with min cues to maintain straight path and improved coordination of BUE.   Gait training in Parallel bars for 185fx 2 with mod A on first bout and min A on second bout from PT. Max cues for improved posture and UE positioning to allow proper swing-to gait pattern. Patient demonstrated significantly improved technique following cues from PT.  PT also instructed patient in gait training with RW for 15 ft with mod A and mod multimodal cues for improved posture and decreased knee flexion prior to each step to improve mechanical advantage of UE and allow increased swing-to step length.   Patient performed squat pivot to [facility] supervision A and min cues for WC parts management.   Patient returned to room and left sitting in [facility] bell in reach.     Therapy Documentation Precautions:  Precautions Precautions: Fall Precaution Comments: wound vac Restrictions Weight Bearing Restrictions: Yes RLE Weight Bearing: Non weight bearing General:   Vital Signs: Therapy Vitals Temp: 99 F (37.2 C) Temp Source: Oral Pulse Rate: 86 Resp: 18 BP: (!) 113/57 mmHg Patient Position (if appropriate): Sitting Oxygen Therapy SpO2: 97 % O2 Device: Not Delivered Pain: Pain Assessment Pain Assessment: No/denies pain   See Function Navigator for Current Functional Status.   Therapy/Group: Individual  Therapy  AuLorie Phenix/13/2017, 1:28 PM

## 2015-09-04 NOTE — Progress Notes (Signed)
Recreational Therapy Session Note  Patient Details  Name: Craig Bentley MRN: [id number] Date of Birth: 11-10-1955 Today's Date: 09/04/2015  Pain: no c/o Skilled Therapeutic Interventions/Progress Updates: Referred for community outing to [place] to assist in discharge planning.  Pt agreeable to this and scheduled for outing today.  Education provided to facility staff on pts current level of function, potential modifications, AE needs & overall safety. Staff stated understanding and will be moving pt to a different room where bathroom and shower area that better accommodate his needs. Therapy/Group: [employer]    Ardit Danh 09/04/2015, 3:22 PM

## 2015-09-04 NOTE — Progress Notes (Signed)
Occupational Therapy Session Note  Patient Details  Name: Craig Bentley MRN: [id number] Date of Birth: [birth year]  Today's Date: 09/04/2015 OT Individual Time: 1030-1200 OT Individual Time Calculation (min): 90 min    Short Term Goals: Week 1:  OT Short Term Goal 1 (Week 1): Pt will transfer to toilet with squat pivot with S. OT Short Term Goal 2 (Week 1): Pt will be able to tolerate standing for at least 2 minutes to increase Independence with toileting. OT Short Term Goal 3 (Week 1): Pt will dress with set up from bed level.      Skilled Therapeutic Interventions/Progress Updates:     Pt seen this session for a community outing/ home evaluation at pt's place of residence, [facility]. Pt taken in rehab van with w/c lift to determine if pt will be safe at a mod I level or if he will need S.  The eval was also to provide education to staff on pt's current needs (skin condition, wrapping limb prior to shower, level of S/A needed) and to determine DME needs.  The ALF has a somewhat steep ramp into the home from the outside social area and several steep ramps from dining area to the rooms. Pt will need A to go up/down as it is challenging to controls speed of w/c. His current room's bathroom was not accessible nor was the hallway shower.  The staff was able to identify a more convienent room on another wing.   The pt practiced bed transfers with verbal cues to move amputee pad out of the way, to position his w/c close enough, and to lock his breaks. He was asked to demonstrate how he would get into bed and needed to be stopped as he did not do these things, with cuing he was able to follow through.  His main caregiver, Wells Guiles, was present and stated that the staff would provide him with A. In the new bathroom, pt was able to squat pivot with close S using a L grab bar. Pt will need a BSC to go over the toilet. Caregiver observed this transfer. In shower room, tub bench adjusted to allow pt to  complete squat pivot. Shower with zero entry  Bottom.   Pt will need S in the ALF. In inpt rehab, pt will need to continue to work on safe transfers, preparing w/c for transfers, and standing balance with L grab bar for clothing management. Pt taken back to inpt rehab and in room with all needs met.  Therapy Documentation Precautions:  Precautions Precautions: Fall Precaution Comments: wound vac Restrictions Weight Bearing Restrictions: Yes RLE Weight Bearing: Non weight bearing    Vital Signs: Therapy Vitals Temp: 99 F (37.2 C) Temp Source: Oral Pulse Rate: 86 Resp: 18 BP: (!) 113/57 mmHg Patient Position (if appropriate): Sitting Oxygen Therapy SpO2: 97 % O2 Device: Not Delivered Pain: Pain Assessment Pain Assessment: No/denies pain ADL: ADL ADL Comments: Refer to functional navigator  See Function Navigator for Current Functional Status.   Therapy/Group: Individual Therapy  [place] 09/04/2015, 12:42 PM

## 2015-09-04 NOTE — Progress Notes (Signed)
Occupational Therapy Session Note  Patient Details  Name: Julious Langlois MRN: [id number] Date of Birth: 07/07/1955  Today's Date: 09/04/2015 OT Individual Time: 9470-9628 OT Individual Time Calculation (min): 60 min    Short Term Goals: Week 1:  OT Short Term Goal 1 (Week 1): Pt will transfer to toilet with squat pivot with S. OT Short Term Goal 2 (Week 1): Pt will be able to tolerate standing for at least 2 minutes to increase Independence with toileting. OT Short Term Goal 3 (Week 1): Pt will dress with set up from bed level.  Skilled Therapeutic Interventions/Progress Updates:    1:1 self care retraining at shower level. Focus on squat pivot transfer in and out of zero entry shower with grab bar.  Pt bathed with residual limb wrapped.  Pt required steadying A and A to wash bottom in standing. Pt able to obtain clothing at w/c level with one vc to initiate. Pt performed sit to stand with RW for clothing management with supervision and A just so pants will not fall down to the floor.   Therapy Documentation Precautions:  Precautions Precautions: Fall Precaution Comments: wound vac Restrictions Weight Bearing Restrictions: Yes RLE Weight Bearing: Non weight bearing  Pain: No c/o pain  ADL: ADL ADL Comments: Refer to functional navigator  See Function Navigator for Current Functional Status.   Therapy/Group: Individual Therapy  Willeen Cass [facility] 09/04/2015, 12:35 PM

## 2015-09-05 ENCOUNTER — Inpatient Hospital Stay (HOSPITAL_COMMUNITY): Payer: Self-pay | Admitting: *Deleted

## 2015-09-05 ENCOUNTER — Inpatient Hospital Stay (HOSPITAL_COMMUNITY): Payer: Self-pay | Admitting: Occupational Therapy

## 2015-09-05 ENCOUNTER — Inpatient Hospital Stay (HOSPITAL_COMMUNITY): Payer: Self-pay | Admitting: Physical Therapy

## 2015-09-05 DIAGNOSIS — T8131XD Disruption of external operation (surgical) wound, not elsewhere classified, subsequent encounter: Secondary | ICD-10-CM

## 2015-09-05 LAB — GLUCOSE, CAPILLARY
Glucose-Capillary: 194 mg/dL — ABNORMAL HIGH (ref 65–99)
Glucose-Capillary: 122 mg/dL — ABNORMAL HIGH (ref 65–99)
Glucose-Capillary: 145 mg/dL — ABNORMAL HIGH (ref 65–99)
Glucose-Capillary: 148 mg/dL — ABNORMAL HIGH (ref 65–99)

## 2015-09-05 NOTE — Progress Notes (Signed)
Physical Therapy Session Note  Patient Details  Name: Craig Bentley MRN: [id number] Date of Birth: [birth year]  Today's Date: 09/05/2015 PT Concurrent Time: 0910-1030 PT Concurrent Time Calculation (min): 80 min  Short Term Goals: Week 1:  PT Short Term Goal 1 (Week 1): STG =LTG due to ELOS.  Week 2:     Skilled Therapeutic Interventions/Progress Updates:    Patient received sitting EOB with RN present for administration of medication.  Following medication, RN re-wrapped Residual limb. Patient performed upper body and lower body dressing with min-mod A from PT for lower body clothing magamenet. Patient performed sit<>stand to pull pants to waist with assist from therapist.  Squat pivot transfers performed x 8 throughout treatment to and from various seat heights with supervision A from PT with min cues for WC parts management to lift arm rest out of way prior to transfer to increased reach to allow room and safe and effective movement without repositioning UE mid transfer.   Patient performed WC mobility for 162f x 4 with supervision A and min cues to improve equal force of push to maintain straight trajectory.   Patient performed supine therex including SLR, hip abduction and SAQ on BLE. X 10 for each exercise with min cues to prevent compensation from trunk. Patient noted to have had incontinent bladder episode and returned to room for personal hygiene. Patient doffed and donned pant and underpants with lateral lean L and R x 8 each direction. Patient was able to perform perineal hygiene with supervision A with Lateral Leans x 4 each direction.   Patient returned to gym and performed gait trianing for 159fx2 with min A from PT. Mod cues for improved push through BUE to allow swing-to gait pattern and improved posture. Patient noted to have significantly improved technique following cues from PT on second bout.   Patient returned to room and left sitting in [facility] call bell within reach.    Therapy Documentation Precautions:  Precautions Precautions: Fall Precaution Comments: wound vac Restrictions Weight Bearing Restrictions: Yes RLE Weight Bearing: Non weight bearing General: PT Amount of Missed Time (min): 10 Minutes PT Missed Treatment Reason: Nursing care Vital Signs: Therapy Vitals Temp: 98.8 F (37.1 C) Temp Source: Oral Pulse Rate: 88 Resp: 16 BP: 135/62 mmHg Patient Position (if appropriate): Sitting Oxygen Therapy SpO2: 100 % O2 Device: Not Delivered   See Function Navigator for Current Functional Status.   Therapy/Group: Individual Therapy  AuLorie Phenix/14/2017, 5:32 PM

## 2015-09-05 NOTE — Progress Notes (Signed)
Occupational Therapy Session Note  Patient Details  Name: Craig Bentley MRN: [id number] Date of Birth: 1955-12-08  Today's Date: 09/05/2015 OT Individual Time: 1102-1201 OT Individual Time Calculation (min): 59 min    Skilled Therapeutic Interventions/Progress Updates:    Pt participated in skilled OT tx focusing on improving w/c and functional mobility safety. Pt reported having already washed up yesterday and declined to complete again. Pt donned clean shirt with self setup with one cue to initiate w/c level. Pt put away clean clothes in drawers with RW and steadying assistance with 1 small LOB while backing up to w/c with therapist correction. Pt was provided education on proper RW placement to prevent LOB with pt verbalized understanding. Due to pt desire to go outside, the remainder of tx took place outside on ground floor. Pt participated in therapeutic activity of item retrieval on ground with emphasis on locking w/c when stopping to complete functional tasks. Pt teach back was utilized and pt was educated on therapeutic benefit of task "so I will lock the brakes when I pick up socks from the floor." In room pt required mod cues for locking brakes and outside pt required min cues. Will assess carryover at later session. Pt completed UE exercises with use of orange theraband and individualized HEP. Pt completed exercises at [facility] and Mod A for carryover of proper technique in order to increase upper body strength for self care and functional transfers. Pt continues to benefit from balance remediation, especially when backing up to w/c. Pt left in room with all needs within reach for lunch. No c/o pain.   Therapy Documentation Precautions:  Precautions Precautions: Fall Precaution Comments: wound vac Restrictions Weight Bearing Restrictions: Yes RLE Weight Bearing: Non weight bearing General:   Vital Signs: Therapy Vitals Temp: 98.8 F (37.1 C) Temp Source: Oral Pulse Rate: 88 Resp:  16 BP: 135/62 mmHg Patient Position (if appropriate): Sitting Oxygen Therapy SpO2: 100 % O2 Device: Not Delivered   ADL: ADL ADL Comments: Refer to functional navigator Exercises:   Other Treatments:    See Function Navigator for Current Functional Status.   Therapy/Group: Individual Therapy  Ladonna Vanorder A Adlynn Lowenstein 09/05/2015, 4:11 PM

## 2015-09-05 NOTE — Progress Notes (Signed)
Craig Bentley PHYSICAL MEDICINE & REHABILITATION     PROGRESS NOTE  Subjective/Complaints:  Pt laying in bed.  He asks, "I'm going to be okay, right".  He states he was seen by Ortho yesterday as well.   ROS: Denies CP, SOB, N/V/D.  Objective: Vital Signs: Blood pressure 131/66, pulse 84, temperature 98.4 F (36.9 C), temperature source Oral, resp. rate 18, height '5\' 11"'$  (1.803 m), weight 70.5 kg (155 lb 6.8 oz), SpO2 98 %. No results found.  Recent Labs  09/04/15 0847  WBC 12.3*  HGB 9.7*  HCT 32.2*  PLT 450*    Recent Labs  09/04/15 0847  NA 137  K 3.6  CL 98*  GLUCOSE 207*  BUN 9  CREATININE 0.66  CALCIUM 9.3   CBG (last 3)   Recent Labs  09/03/15 2107 09/04/15 0650 09/04/15 1210  GLUCAP 148* 120* 107*    Wt Readings from Last 3 Encounters:  09/05/15 70.5 kg (155 lb 6.8 oz)  08/28/15 69.6 kg (153 lb 7 oz)  05/16/15 78.926 kg (174 lb)    Physical Exam:  BP 131/66 mmHg  Pulse 84  Temp(Src) 98.4 F (36.9 C) (Oral)  Resp 18  Ht '5\' 11"'$  (1.803 m)  Wt 70.5 kg (155 lb 6.8 oz)  BMI 21.69 kg/m2  SpO2 98% Constitutional: He appears well-developed and well-nourished.  HENT: Normocephalic and atraumatic.  Eyes: Conjunctivae and EOM are normal. No scleral icterus.  Cardiovascular: Normal rate and regular rhythm.  Respiratory: Bilateral clear to ausculation. Effort normal. No stridor. He exhibits no tenderness.  GI: Soft. Bowel sounds are normal. He exhibits no distension. There is no tenderness.  Musculoskeletal: He exhibits edema. R-BKA.  Neurological: He is alert and oriented x3.  Able to follow basic commands without difficulty.  Motor: 4+/5 throughout B/l and LLE: RLE 4+/5 HF Skin: Skin is warm and dry.   Staples with central area of dehiscence (stable) and mild drainage (decreased)  Dry skin to sacral area, no breakdown Psychiatric: He has a normal mood and affect. His behavior is normal.   Assessment/Plan: 1. Functional deficits secondary to  PAD, s/p right BKA which require 3+ hours per day of interdisciplinary therapy in a comprehensive inpatient rehab setting. Physiatrist is providing close team supervision and 24 hour management of active medical problems listed below. Physiatrist and rehab team continue to assess barriers to discharge/monitor patient progress toward functional and medical goals.  Function:  Bathing Bathing position   Position: Shower  Bathing parts Body parts bathed by patient: Right arm, Left arm, Chest, Abdomen, Right upper leg, Left upper leg, Front perineal area Body parts bathed by helper: Buttocks, Back  Bathing assist Assist Level: Touching or steadying assistance(Pt > 75%)      Upper Body Dressing/Undressing Upper body dressing   What is the patient wearing?: Pull over shirt/dress     Pull over shirt/dress - Perfomed by patient: Thread/unthread right sleeve, Thread/unthread left sleeve, Put head through opening, Pull shirt over trunk          Upper body assist Assist Level: More than reasonable time      Lower Body Dressing/Undressing Lower body dressing   What is the patient wearing?: Pants, Non-skid slipper socks     Pants- Performed by patient: Thread/unthread right pants leg, Thread/unthread left pants leg, Pull pants up/down Pants- Performed by helper: Thread/unthread right pants leg Non-skid slipper socks- Performed by patient: Don/doff right sock Non-skid slipper socks- Performed by helper: Don/doff left sock  Lower body assist Assist for lower body dressing: Supervision or verbal cues      Toileting Toileting   Toileting steps completed by patient: Adjust clothing prior to toileting Toileting steps completed by helper: Performs perineal hygiene, Adjust clothing after toileting Toileting Assistive Devices: Grab bar or rail  Toileting assist Assist level: Touching or steadying assistance (Pt.75%)   Transfers Chair/bed transfer   Chair/bed transfer  method: Squat pivot Chair/bed transfer assist level: Supervision or verbal cues Chair/bed transfer assistive device: Armrests     Locomotion Ambulation Ambulation activity did not occur: Refused   Max distance: 15f Assist level: Moderate assist (Pt 50 - 74%)   Wheelchair   Type: Manual Max wheelchair distance: 1523fAssist Level: Supervision or verbal cues  Cognition Comprehension Comprehension assist level: Follows basic conversation/direction with no assist  Expression Expression assist level: Expresses basic needs/ideas: With no assist  Social Interaction Social Interaction assist level: Interacts appropriately with others with medication or extra time (anti-anxiety, antidepressant).  Problem Solving Problem solving assist level: Solves basic 90% of the time/requires cueing < 10% of the time  Memory Memory assist level: Recognizes or recalls 75 - 89% of the time/requires cueing 10 - 24% of the time    Medical Problem List and Plan: 1. Functional and mobility deficits secondary to PAD, s/p right BKA on 7/5  Cont CIR  2. DVT Prophylaxis/Anticoagulation: lovenox '40mg'$  sq daily 3. Pain Management: pt reports good pain control at present.  tramadol/oxycodone '5mg'$  q4-6 prn.  robaxin '500mg'$  prn muscle spasms.  gabapentin for peripheral neuropathic pain 4. Mood:  -haldol, aricept, clozapine 5. Neuropsych: This patient is capable of making decisions on his own behalf. 6. Skin/Wound Care: Vac d/ced on 7/11 to amputation site.   Wound dehiscence, Ortho evaluated, appreciate recs. Cont abx and local wound care. 7. Fluids/Electrolytes/Nutrition: encourage po, check admission labs 8. Acute Renal Failure:  Resolved on 7/7 9. 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 Essential HTN - metoprolol '25mg'$  bid 11. DM2---diet controlled.  Cont to monitor, will consider medications if necessary  check cbg's AC and HS. SSI as appropriate 12. Acute  encephalopathy--?resolved 13. Fever: Afebrile at present  WBCs 12.3 on 7/13  UA with some bacteria, Ucx insignificant growth  CXR 7/7 reviewed, suggesting PNA, repeat CXR reviewed with stable opacity, likely chronic  Encourage IS 14. ABLA  Hb 9.7 on 7/13  Cont to monitor  LOS (Days) 8 A FACE TO FACE EVALUATION WAS PERFORMED  Craig Bentley/14/2017 10:09 AM

## 2015-09-05 NOTE — Progress Notes (Signed)
Physical Therapy Session Note  Patient Details  Name: Craig Bentley MRN: [id number] Date of Birth: [birth year]  Today's Date: 09/05/2015 PT Individual Time: 1530-1615 PT Individual Time Calculation (min): 45 min   Short Term Goals: Week 1:  PT Short Term Goal 1 (Week 1): STG =LTG due to ELOS.   Skilled Therapeutic Interventions/Progress Updates:   Patient received sitting in [facility] and agreeable to PT. Patient performed WC mobility for 194f, 2069f 25099fnd 75 ft in controlled environment with supervision A from PT with min cues for improved obstacle navigation and equal force with push through BUE to prevent veer to L.  Patient also instructed in [facility] training in simulated community environment including uneven cement sidewalk and handicap access rampx [id number] parking garage with supervision A. PT provided min-mod cues for imrpoved forward lean on ramp and uneven surface to prevent posterior tipping in WC. Patient was able maintain forward lean up entire ramp.   Patient peformed seated therex including LAQ with BLE, marches and press ups with push from arm rests. X 10 for all exercises with min cues for improve ROM and increased forward weight shift with press ups.   Therapy Documentation Precautions:  Precautions Precautions: Fall Precaution Comments: wound vac Restrictions Weight Bearing Restrictions: Yes RLE Weight Bearing: Non weight bearing General: PT Amount of Missed Time (min): 10 Minutes PT Missed Treatment Reason: Nursing care Vital Signs:    See Function Navigator for Current Functional Status.   Therapy/Group: Individual Therapy  AustLorie Phenix4/2017, 5:48 PM

## 2015-09-06 ENCOUNTER — Inpatient Hospital Stay (HOSPITAL_COMMUNITY): Payer: Self-pay | Admitting: Occupational Therapy

## 2015-09-06 NOTE — Progress Notes (Signed)
Patient ID: Praneel Haisley, male   DOB: [birth year], 60 y.o.   MRN: [id number]   09/06/15.  [facility] PHYSICAL MEDICINE & REHABILITATION     PROGRESS NOTE  60 year old patient who is admitted for CIR with Functional and mobility deficits secondary to PAD, s/p right BKA on 7/5  Past Medical History  Diagnosis Date  . Hypertension   . Diabetes mellitus   . Schizophrenia, schizo-affective ([place])   . COPD (chronic obstructive pulmonary disease) ([place])   . Constipation   . Fecal impaction ([place])   . Pneumonia   . Anxiety      Subjective/Complaints:  Feels well.  Please with his progress.  No new concerns or complaints  ROS: Denies CP, SOB, N/V/D.  Objective: Vital Signs: Blood pressure 170/77, pulse 79, temperature 98.4 F (36.9 C), temperature source Oral, resp. rate 18, height '5\' 11"'$  (1.803 m), weight 155 lb 13.8 oz (70.7 kg), SpO2 100 %. No results found.  Recent Labs  09/04/15 0847  WBC 12.3*  HGB 9.7*  HCT 32.2*  PLT 450*    Recent Labs  09/04/15 0847  NA 137  K 3.6  CL 98*  GLUCOSE 207*  BUN 9  CREATININE 0.66  CALCIUM 9.3   CBG (last 3)   Recent Labs  09/04/15 2100 09/05/15 0706 09/05/15 1150  GLUCAP 122* 145* 148*   Lab Results  Component Value Date   HGBA1C 6.3* 04/28/2015    Wt Readings from Last 3 Encounters:  09/06/15 155 lb 13.8 oz (70.7 kg)  08/28/15 153 lb 7 oz (69.6 kg)  05/16/15 174 lb (78.926 kg)    BP Readings from Last 3 Encounters:  09/06/15 170/77  08/28/15 118/82  08/20/15 126/103    Physical Exam:  BP 170/77 mmHg  Pulse 79  Temp(Src) 98.4 F (36.9 C) (Oral)  Resp 18  Ht '5\' 11"'$  (1.803 m)  Wt 155 lb 13.8 oz (70.7 kg)  BMI 21.75 kg/m2  SpO2 100% Constitutional: He appears well-developed and well-nourished.  HENT: Normocephalic and atraumatic.  Eyes: Conjunctivae and EOM are normal. No scleral icterus.  Cardiovascular: Normal rate and regular rhythm.  Respiratory: Bilateral clear to ausculation. Effort normal. No  stridor. He exhibits no tenderness.  GI: Soft. Bowel sounds are normal. He exhibits no distension. There is no tenderness. Abdominal scar  Musculoskeletal: He exhibits edema. R-BKA.  Neurological: He is alert and oriented x3.  Able to follow basic commands without difficulty.  Motor: 4+/5 throughout B/l and LLE: RLE 4+/5 HF Skin: Skin is warm and dry.    Medical Problem List and Plan: 1. Functional and mobility deficits secondary to PAD, s/p right BKA on 7/5  Cont CIR  2. DVT Prophylaxis/Anticoagulation: lovenox '40mg'$  sq daily 3. Pain Management: pt reports good pain control at present.  tramadol/oxycodone '5mg'$  q4-6 prn.  robaxin '500mg'$  prn muscle spasms.  gabapentin for peripheral neuropathic pain 4. Mood:  -haldol, aricept, clozapine  5.  Skin/Wound Care: Vac d/ced on 7/11 to amputation site.   Wound dehiscence, Ortho evaluated, appreciate recs. Cont abx and local wound care.   6. Essential HTN - metoprolol '25mg'$  bid 7.  Diabetes mellitus.Cont to monitor, will consider medications if necessary  check cbg's AC and HS. SSI as appropriate  8. Fever: Afebrile at present  WBCs 12.3 on 7/13  UA with some bacteria, Ucx insignificant growth  CXR 7/7 reviewed, suggesting PNA, repeat CXR reviewed with stable opacity, likely chronic  Encourage IS 9. ABLA  Hb 9.7 on 7/13  Cont  to monitor  LOS (Days) 9 A FACE TO FACE EVALUATION WAS PERFORMED  Nyoka Cowden 09/06/2015 10:52 AM

## 2015-09-06 NOTE — Plan of Care (Signed)
Problem: RH BOWEL ELIMINATION Goal: RH STG MANAGE BOWEL WITH ASSISTANCE STG Manage Bowel with min Assistance.  Outcome: Not Progressing LBM 7/12- Patient taking lactulose this morning

## 2015-09-06 NOTE — Progress Notes (Signed)
Occupational Therapy Session Note  Patient Details  Name: Etan Vasudevan MRN: [id number] Date of Birth: 09-17-1955  Today's Date: 09/06/2015 OT Individual Time: 1130-1200 OT Individual Time Calculation (min): 30 min    Skilled Therapeutic Interventions/Progress Updates:    Pt participated in skilled OT session focusing on safe tub bench transfers and w/c safety. Pt was taken to therapy apartment bathroom and was provided visual demonstration of proper tub bench transfer with grab bars on left side as per d/c setup. Pt was able to complete two transfers with RW to bench with steadying assistance and mod vcs on safe hand placement and sequencing. Pt completed simulated LB bathing post transfer with use of grab bars for unilateral support and close supervision for safety. Pt was instructed on energy conservation techniques with verbalized understanding. Pt continues to require mod vcs for safe hand placement during sit to stands/stands to sits and benefits from additional training. Pt was able to teach back education provided during last therapy session regarding w/c safety. Pt left in room with all needs within reach at end of session. All equipment sanitized with purple wipes.   Therapy Documentation Precautions:  Precautions Precautions: Fall Precaution Comments: wound vac Restrictions Weight Bearing Restrictions: Yes RLE Weight Bearing: Non weight bearing  Pain: Pt reported pain to be manageable during session with rest breaks for massage/tapping    ADL: ADL ADL Comments: Refer to functional navigator     See Function Navigator for Current Functional Status.   Therapy/Group: Individual Therapy  Juvia Aerts A Terah Robey 09/06/2015, 12:30 PM

## 2015-09-07 ENCOUNTER — Inpatient Hospital Stay (HOSPITAL_COMMUNITY): Payer: Self-pay | Admitting: Physical Therapy

## 2015-09-07 ENCOUNTER — Inpatient Hospital Stay (HOSPITAL_COMMUNITY): Payer: Self-pay | Admitting: Occupational Therapy

## 2015-09-07 NOTE — Progress Notes (Signed)
Patient ID: Craig Bentley, male   DOB: 1956/01/15, 60 y.o.   MRN: [id number]   Patient ID: Craig Bentley, male   DOB: Jul 31, 1955, 60 y.o.   MRN: [id number]   09/07/15.  [facility] PHYSICAL MEDICINE & REHABILITATION     PROGRESS NOTE  60 year old patient who is admitted for CIR with Functional and mobility deficits secondary to PAD, s/p right BKA on 7/5  Past Medical History  Diagnosis Date  . Hypertension   . Diabetes mellitus   . Schizophrenia, schizo-affective ([place])   . COPD (chronic obstructive pulmonary disease) ([place])   . Constipation   . Fecal impaction ([place])   . Pneumonia   . Anxiety      Subjective/Complaints:  Feels well.  Please with his progress.  No new concerns or complaints.   Excited about possible discharge on Tuesday  ROS: Denies CP, SOB, N/V/D.  Objective: Vital Signs: Blood pressure 138/65, pulse 85, temperature 99 F (37.2 C), temperature source Oral, resp. rate 18, height '5\' 11"'$  (1.803 m), weight 155 lb 10.3 oz (70.6 kg), SpO2 98 %. No results found. No results for input(s): WBC, HGB, HCT, PLT in the last 72 hours. No results for input(s): NA, K, CL, GLUCOSE, BUN, CREATININE, CALCIUM in the last 72 hours.  Invalid input(s): CO CBG (last 3)   Recent Labs  09/04/15 2100 09/05/15 0706 09/05/15 1150  GLUCAP 122* 145* 148*   Lab Results  Component Value Date   HGBA1C 6.3* 04/28/2015    Wt Readings from Last 3 Encounters:  09/07/15 155 lb 10.3 oz (70.6 kg)  08/28/15 153 lb 7 oz (69.6 kg)  05/16/15 174 lb (78.926 kg)    BP Readings from Last 3 Encounters:  09/07/15 138/65  08/28/15 118/82  08/20/15 126/103    Physical Exam:  BP 138/65 mmHg  Pulse 85  Temp(Src) 99 F (37.2 C) (Oral)  Resp 18  Ht '5\' 11"'$  (1.803 m)  Wt 155 lb 10.3 oz (70.6 kg)  BMI 21.72 kg/m2  SpO2 98% Constitutional: He appears well-developed and well-nourished.  HENT: Normocephalic and atraumatic.  Eyes: Conjunctivae and EOM are normal. No scleral icterus.   Cardiovascular: Normal rate and regular rhythm.  Respiratory: Bilateral clear to ausculation. Effort normal. No stridor. He exhibits no tenderness.  GI: Soft. Bowel sounds are normal. He exhibits no distension. There is no tenderness. Abdominal scar  Musculoskeletal: He exhibits edema. R-BKA.  Neurological: He is alert and oriented x3.  Able to follow basic commands without difficulty.  Motor: 4+/5 throughout B/l and LLE: RLE 4+/5 HF Skin: Skin is warm and dry.    Medical Problem List and Plan: 1. Functional and mobility deficits secondary to PAD, s/p right BKA on 7/5  Cont CIR  2. DVT Prophylaxis/Anticoagulation: lovenox '40mg'$  sq daily 3. Pain Management: pt reports good pain control at present.  tramadol/oxycodone '5mg'$  q4-6 prn.  robaxin '500mg'$  prn muscle spasms.  gabapentin for peripheral neuropathic pain 4. Mood:  -haldol, aricept, clozapine  5.  Skin/Wound Care: Vac d/ced on 7/11 to amputation site.   Wound dehiscence, Ortho evaluated, appreciate recs. Cont abx and local wound care.   6. Essential HTN - metoprolol '25mg'$  bid 7.  Diabetes mellitus.Cont to monitor, will consider medications if necessary  check cbg's AC and HS. SSI as appropriate  8. Fever: Afebrile at present  WBCs 12.3 on 7/13  UA with some bacteria, Ucx insignificant growth  CXR 7/7 reviewed, suggesting PNA, repeat CXR reviewed with stable opacity, likely chronic  Encourage IS  9. ABLA  Hb 9.7 on 7/13  Cont to monitor  LOS (Days) 10 A FACE TO FACE EVALUATION WAS PERFORMED  Nyoka Cowden 09/07/2015 9:39 AM

## 2015-09-07 NOTE — Progress Notes (Signed)
Occupational Therapy Session Note  Patient Details  Name: Craig Bentley MRN: [id number] Date of Birth: Feb 01, 1956  Today's Date: 09/07/2015 OT Individual Time: 1101-1202 OT Individual Time Calculation (min): 61 min    Skilled Therapeutic Interventions/Progress Updates:    Pt participated in morning ADL routine in shower today with setup. Residual limb and IV wrapped for safety. Pt completed transfer with supervision from w/c to [facility] in shower. UB/LB bathing completed with close supervision while standing with cues for straightening left leg and taking recovery periods to regain standing posture. Pt provided encouragement for thoroughness with pericare. Pt returned to w/c after shower and completed UB/LB dressing with supervision. Pt still exhibits impulsivity with sit to stand/stand to sit and often forgets w/c safety and hand placement. Pt was provided education on importance of carryover of safety education at time of discharge. Pt is able to recall safety techniques, but carryover of education occurs approximately 70% of time. Pt verbalized understanding. Pt retrieved leg rests on floor exhibiting safety awareness and reattached to chair with cues for adaptive tactile technique. Wound observed on lower right thigh with nursing made aware. Pt reported no pain in reddened area/wound.   Pt left in w/c at end of session and all needs within reach.   Therapy Documentation Precautions:  Precautions Precautions: Fall Precaution Comments: wound vac Restrictions Weight Bearing Restrictions: Yes RLE Weight Bearing: Non weight bearing General:   Vital Signs: Therapy Vitals Temp: 98.3 F (36.8 C) Temp Source: Oral Pulse Rate: 85 Resp: 18 BP: 123/63 mmHg Patient Position (if appropriate): Sitting Oxygen Therapy SpO2: 100 % O2 Device: Not Delivered Pain: No c/o pain during session  Pain Assessment Pain Assessment: 0-10 Pain Score: 6  Pain Type: Acute pain Pain Location: Leg Pain  Orientation: Right Pain Descriptors / Indicators: Aching Pain Onset: On-going Patients Stated Pain Goal: 2 Pain Intervention(s): Repositioned;Distraction Multiple Pain Sites: No  ADL ADL Comments: Refer to functional navigator  :    See Function Navigator for Current Functional Status.   Therapy/Group: Individual Therapy  [name] A Merie Wulf 09/07/2015, 3:33 PM

## 2015-09-07 NOTE — Progress Notes (Signed)
Physical Therapy Session Note  Patient Details  Name: Craig Bentley MRN: [id number] Date of Birth: 03-10-1955  Today's Date: 09/07/2015 PT Individual Time: 1330-1400 PT Individual Time Calculation (min): 30 min   Short Term Goals: Week 1:  PT Short Term Goal 1 (Week 1): STG =LTG due to ELOS.   Skilled Therapeutic Interventions/Progress Updates:    Pt received seated in w/c, c/o pain as below in R residual limb and agreeable to treatment. Propels self to sink and washes hands with mod I, increased time. Requires setupA for use of urinal, then washes hands again as above. Pt requesting to go outside. W/c propulsion indoors/outdoors on level and unlevel surfaces including small incline/decline with S overall and cues for safety on uneven terrain. Transfer w/c <>bench outside with S, min verbal cues for setup of w/c prior to transfer with pt initially locking chair with 2 ft gap between chair and bench. While resting on bench, pt performed 2 sets 10 reps LAQ and hip flexion marching. Returned to w/c as above and propelled into building with S. Remained seated in w/c in family room per pt request; alerted RN to pt position.   Therapy Documentation Precautions:  Precautions Precautions: Fall Precaution Comments: wound vac Restrictions Weight Bearing Restrictions: Yes RLE Weight Bearing: Non weight bearing Pain: Pain Assessment Pain Assessment: 0-10 Pain Score: 6  Pain Type: Acute pain Pain Location: Leg Pain Orientation: Right Pain Descriptors / Indicators: Aching Pain Onset: On-going Patients Stated Pain Goal: 2 Pain Intervention(s): Repositioned;Distraction Multiple Pain Sites: No   See Function Navigator for Current Functional Status.   Therapy/Group: Individual Therapy  [name] 09/07/2015, 3:10 PM

## 2015-09-08 ENCOUNTER — Inpatient Hospital Stay (HOSPITAL_COMMUNITY): Payer: Self-pay | Admitting: Occupational Therapy

## 2015-09-08 ENCOUNTER — Inpatient Hospital Stay (HOSPITAL_COMMUNITY): Payer: Medicaid Other | Admitting: Physical Therapy

## 2015-09-08 LAB — GLUCOSE, CAPILLARY
GLUCOSE-CAPILLARY: 151 mg/dL — AB (ref 65–99)
GLUCOSE-CAPILLARY: 126 mg/dL — AB (ref 65–99)
GLUCOSE-CAPILLARY: 157 mg/dL — AB (ref 65–99)
Glucose-Capillary: 125 mg/dL — ABNORMAL HIGH (ref 65–99)
Glucose-Capillary: 159 mg/dL — ABNORMAL HIGH (ref 65–99)
Glucose-Capillary: 112 mg/dL — ABNORMAL HIGH (ref 65–99)
Glucose-Capillary: 134 mg/dL — ABNORMAL HIGH (ref 65–99)
Glucose-Capillary: 159 mg/dL — ABNORMAL HIGH (ref 65–99)
Glucose-Capillary: 125 mg/dL — ABNORMAL HIGH (ref 65–99)
Glucose-Capillary: 118 mg/dL — ABNORMAL HIGH (ref 65–99)
Glucose-Capillary: 148 mg/dL — ABNORMAL HIGH (ref 65–99)
Glucose-Capillary: 154 mg/dL — ABNORMAL HIGH (ref 65–99)

## 2015-09-08 MED ORDER — POLYETHYLENE GLYCOL 3350 17 G PO PACK: 17.0000 g | PACK | Freq: Every day | ORAL | Status: DC

## 2015-09-08 MED ORDER — OXYCODONE HCL 5 MG PO TABS
5.0000 mg | ORAL_TABLET | Freq: Four times a day (QID) | ORAL | Status: DC | PRN
  Administered 2015-09-08 – 2015-09-09 (×2): 5 mg via ORAL
  Filled 2015-09-08 (×2): qty 1

## 2015-09-08 NOTE — Plan of Care (Signed)
Problem: RH Balance Goal: LTG Patient will maintain dynamic standing balance (PT) LTG: Patient will maintain dynamic standing balance with assistance during mobility activities (PT)  Outcome: Completed/Met Date Met:  09/08/15 Static standing with RW  Problem: RH Bed to Chair Transfers Goal: LTG Patient will perform bed/chair transfers w/assist (PT) LTG: Patient will perform bed/chair transfers with assistance, with/without cues (PT).  Outcome: Completed/Met Date Met:  09/08/15 Squat pivot     Problem: RH Car Transfers Goal: LTG Patient will perform car transfers with assist (PT) LTG: Patient will perform car transfers with assistance (PT).  Outcome: Completed/Met Date Met:  09/08/15 Squat pivot  Problem: RH Ambulation Goal: LTG Patient will ambulate in controlled environment (PT) LTG: Patient will ambulate in a controlled environment, # of feet with assistance (PT).  Outcome: Not Met (add Reason) 30 ft with RW; assistance for balance  Problem: RH Wheelchair Mobility Goal: LTG Patient will propel w/c in controlled environment (PT) LTG: Patient will propel wheelchair in controlled environment, # of feet with assist (PT)  Outcome: Completed/Met Date Met:  09/08/15 150 ft  Goal: LTG Patient will propel w/c in home environment (PT) LTG: Patient will propel wheelchair in home environment, # of feet with assistance (PT).  Outcome: Not Met (add Reason) Supervision for safety awareness

## 2015-09-08 NOTE — Progress Notes (Signed)
[facility]     PROGRESS NOTE  Subjective/Complaints:  Pt resting in bed.  He is doing well and asks how my family is doing.   ROS: Denies CP, SOB, N/V/D.  Objective: Vital Signs: Blood pressure 146/63, pulse 83, temperature 98.9 F (37.2 C), temperature source Oral, resp. rate 18, height '5\' 11"'$  (1.803 m), weight 71.2 kg (156 lb 15.5 oz), SpO2 100 %. No results found. No results for input(s): WBC, HGB, HCT, PLT in the last 72 hours. No results for input(s): NA, K, CL, GLUCOSE, BUN, CREATININE, CALCIUM in the last 72 hours.  Invalid input(s): CO CBG (last 3)   Recent Labs  09/05/15 1150  GLUCAP 148*    Wt Readings from Last 3 Encounters:  09/08/15 71.2 kg (156 lb 15.5 oz)  08/28/15 69.6 kg (153 lb 7 oz)  05/16/15 78.926 kg (174 lb)    Physical Exam:  BP 146/63 mmHg  Pulse 83  Temp(Src) 98.9 F (37.2 C) (Oral)  Resp 18  Ht '5\' 11"'$  (1.803 m)  Wt 71.2 kg (156 lb 15.5 oz)  BMI 21.90 kg/m2  SpO2 100% Constitutional: He appears well-developed and well-nourished.  HENT: Normocephalic and atraumatic.  Eyes: Conjunctivae and EOM are normal. No scleral icterus.  Cardiovascular: Normal rate and regular rhythm.  Respiratory: Bilateral clear to ausculation. Effort normal. No stridor. He exhibits no tenderness.  GI: Soft. Bowel sounds are normal. He exhibits no distension. There is no tenderness.  Musculoskeletal: He exhibits mild edema. R-BKA.  Neurological: He is alert and oriented x3.  Able to follow basic commands without difficulty.  Motor: 4+/5 throughout B/l and LLE: RLE 4+/5 HF Skin: Skin is warm and dry.   Staples with central area of dehiscence (stable) without drainage  Dry skin to sacral area, no breakdown Psychiatric: He has a normal mood and affect. His behavior is normal.   Assessment/Plan: 1. Functional deficits secondary to PAD, s/p right BKA which require 3+ hours per day of interdisciplinary therapy in a comprehensive  inpatient rehab setting. Physiatrist is providing close team supervision and 24 hour management of active medical problems listed below. Physiatrist and rehab team continue to assess barriers to discharge/monitor patient progress toward functional and medical goals.  Function:  Bathing Bathing position   Position: Shower  Bathing parts Body parts bathed by patient: Right arm, Left arm, Chest, Abdomen, Front perineal area, Buttocks, Right upper leg, Left upper leg, Right lower leg, Left lower leg, Back Body parts bathed by helper: Buttocks, Back  Bathing assist Assist Level: Supervision or verbal cues      Upper Body Dressing/Undressing Upper body dressing   What is the patient wearing?: Button up shirt     Pull over shirt/dress - Perfomed by patient: Thread/unthread right sleeve, Thread/unthread left sleeve, Put head through opening, Pull shirt over trunk          Upper body assist Assist Level: More than reasonable time   Set up : To obtain clothing/put away  Lower Body Dressing/Undressing Lower body dressing   What is the patient wearing?: Underwear, Pants, Non-skid slipper socks Underwear - Performed by patient: Thread/unthread right underwear leg, Thread/unthread left underwear leg, Pull underwear up/down   Pants- Performed by patient: Thread/unthread right pants leg, Thread/unthread left pants leg, Pull pants up/down Pants- Performed by helper: Thread/unthread right pants leg Non-skid slipper socks- Performed by patient: Don/doff right sock Non-skid slipper socks- Performed by helper: Don/doff left sock  Lower body assist Assist for lower body dressing: Supervision or verbal cues      Toileting Toileting   Toileting steps completed by patient: Adjust clothing prior to toileting Toileting steps completed by helper: Performs perineal hygiene, Adjust clothing after toileting Toileting Assistive Devices: Grab bar or rail  Toileting assist Assist  level: Touching or steadying assistance (Pt.75%)   Transfers Chair/bed transfer   Chair/bed transfer method: Squat pivot Chair/bed transfer assist level: Supervision or verbal cues Chair/bed transfer assistive device: Armrests     Locomotion Ambulation Ambulation activity did not occur: Refused   Max distance: 15 Assist level: Touching or steadying assistance (Pt > 75%)   Wheelchair   Type: Manual Max wheelchair distance: 200 Assist Level: Supervision or verbal cues  Cognition Comprehension Comprehension assist level: Follows basic conversation/direction with no assist  Expression Expression assist level: Expresses basic needs/ideas: With no assist  Social Interaction Social Interaction assist level: Interacts appropriately with others with medication or extra time (anti-anxiety, antidepressant).  Problem Solving Problem solving assist level: Solves basic 90% of the time/requires cueing < 10% of the time  Memory Memory assist level: Recognizes or recalls 75 - 89% of the time/requires cueing 10 - 24% of the time    Medical Problem List and Plan: 1. Functional and mobility deficits secondary to PAD, s/p right BKA on 7/5  Cont CIR  2. DVT Prophylaxis/Anticoagulation: lovenox '40mg'$  sq daily 3. Pain Management: pt reports good pain control at present.  tramadol/oxycodone '5mg'$  q4-6 prn.  robaxin '500mg'$  prn muscle spasms.  gabapentin for peripheral neuropathic pain 4. Mood:  -haldol, aricept, clozapine 5. Neuropsych: This patient is capable of making decisions on his own behalf. 6. Skin/Wound Care: Vac d/ced on 7/11 to amputation site.   Wound dehiscence, Ortho evaluated, appreciate recs. Cont abx and local wound care. 7. Fluids/Electrolytes/Nutrition: encourage po, check admission labs 8. Acute Renal Failure:  Resolved on 7/7 9. 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 Essential HTN - metoprolol '25mg'$  bid 11. DM2---diet controlled.  Cont  to monitor, will consider medications if necessary  SSI as appropriate 12. Acute encephalopathy--?resolved 13. Fever: Afebrile at present  WBCs 12.3 on 7/13  UA with some bacteria, Ucx insignificant growth  CXR 7/7 reviewed, suggesting PNA, repeat CXR reviewed with stable opacity, likely chronic  Encourage IS 14. ABLA  Hb 9.7 on 7/13  Cont to monitor  LOS (Days) 11 A FACE TO FACE EVALUATION WAS PERFORMED  Ankit Lorie Phenix 09/08/2015 9:28 AM

## 2015-09-08 NOTE — Discharge Summary (Signed)
Physician Discharge Summary  Patient ID: Craig Bentley MRN: [id number] DOB/AGE: 1955/11/09 60 y.o.  Admit date: 08/28/2015 Discharge date: 09/09/2015  Discharge Diagnoses:  Principal Problem:   Status post below knee amputation of right lower extremity ([place]) Active Problems:   Essential hypertension   COPD (chronic obstructive pulmonary disease) (HCC)   Diabetes mellitus with neurologic complication, without long-term current use of insulin (HCC)   ARF (acute renal failure) (HCC)   Fever   SIRS (systemic inflammatory response syndrome) (HCC)   Leukocytosis   Acute blood loss anemia   Opacity of lung on imaging study   Low grade fever   Surgical wound dehiscence   Discharged Condition: Stable.   Significant Diagnostic Studies:  Dg Chest 2 View  08/29/2015  CLINICAL DATA:  Fever, productive cough EXAM: CHEST  2 VIEW COMPARISON:  08/21/2015 FINDINGS: Mild patchy left lower lobe opacity, overlying the spine on the lateral view, suspicious for pneumonia. The heart is normal in size. Visualized osseous structures are within normal limits. IMPRESSION: Left lower lobe opacity, suspicious for pneumonia. Electronically Signed   By: Julian Hy M.D.   On: 08/29/2015 19:19    Dg Chest Port 1 View  09/02/2015  CLINICAL DATA:  Leukocytosis. History of hypertension, diabetes and smoking. EXAM: PORTABLE CHEST 1 VIEW COMPARISON:  08/29/2015 FINDINGS: Interstitial type opacities noted in the left lower lung are without change from the prior study. There are no new areas of lung opacification. No pleural effusion or pneumothorax. Heart, mediastinum and hila are unremarkable. Bony thorax is intact. IMPRESSION: 1. Left lower lung opacity noted on prior studies without significant change. This is likely chronic. It is similar to the appearance of the chest radiograph dated 09/16/2013. Remainder of the lungs is clear. Electronically Signed   By: Lajean Manes M.D.   On: 09/02/2015 11:23    Labs:   Basic Metabolic Panel:  Recent Labs Lab 09/04/15 0847 09/09/15 0715  NA 137 140  K 3.6 4.0  CL 98* 102  CO2 30 31  GLUCOSE 207* 131*  BUN 9 11  CREATININE 0.66 0.80  CALCIUM 9.3 9.5    CBC:  Recent Labs Lab 09/04/15 0847 09/09/15 0715  WBC 12.3* 10.8*  NEUTROABS 9.2* 7.4  HGB 9.7* 9.6*  HCT 32.2* 31.8*  MCV 86.3 86.4  PLT 450* 368    CBG:  Recent Labs Lab 09/08/15 0642 09/08/15 1128 09/08/15 1702 09/08/15 2028 09/09/15 0623  GLUCAP 126* 157* 149* 174* 103*    Brief HPI:   Craig Bentley is a 60 y.o. 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 He was admitted on 08/21/15 with lethargy and hypotension felt to be related to fentanyl--improved with narcan. He was started on IVF and IV antibiotics due to sepsis and acute renal failure. Right foot not felt to be salvageable per Dr. Bridgett Larsson and Dr. Sharol Given. 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 Sharol Given. Therapy evaluations done today and patient felt to be good CIR candidate.   Hospital Course: Craig Bentley was admitted to rehab 08/28/2015 for inpatient therapies to consist of PT and OT at least three hours five days a week. Past admission physiatrist, therapy team and rehab RN have worked together to provide customized collaborative inpatient rehab. He was maintained on doxycyline for two weeks to complete antibiotic regimen as per  recommendations. Blood pressures were monitored bid and have been reasonably stable. He has been afebrile during his stay. Follow up CBC shows improvement in leucocytosis to 10.8 and ABLA is stable. Diabetes has been monitored with ac/hs cbg checks and elevated blood sugars were managed with SSI. Po intake has improved and BS were ranging in 120- 150 range in the last 24 hours therefore metformin was resumed  prior to discharge. His pain has been reasonably controlled and oxycodone was discontinued at discharge. He is to continue to use ultram prn for severe pain.     He continued to have mild edema R-BKA after VAC was discontinued. He has had area of central dehiscence along stapled incision line.  Ortho was consulted for input and recommended local care with bactroban and dry dressing. He was started on po keflex for 5 days and wound has been stable without drainage. He has been educated on tobacco cessation to help with wound healing and was willing to try nicotine patch to help with compliance. His activity tolerance and safety awareness have improved and he is currently independent at wheelchair level in controlled environment. He will continue to receive follow up HHPT and [place] at facility after discharge.    Rehab course: During patient's stay in rehab weekly team conferences were held to monitor patient's progress, set goals and discuss barriers to discharge. At admission, patient required min assist with basic self care tasks and mobility. He has had improvement in activity tolerance, balance, postural control, as well as ability to compensate for deficits.  He is able to complete ADL tasks with supervision. He is able to perform squat pivot transfers at modified independent level. He is able to propel his wheelchair for 200' at modified independent level and  requires supervision for wheelchair mobility in community setting.   He is able to ambulate 77' with RW and min-guard assist.     Disposition: Assisted  Living Facility  Diet: Diabetic.   Special Instructions: 1. Wash incision with soap and water. Pat dry. Apply small amount of Bactroban ointment and cover with dry dressing and then ace wrap for compression. .  2. Contact MD if you develop any problems with your incision/wound--redness, swelling, increase in pain, drainage or if you develop fever or chills.  3. NO SMOKING.  4. Start  nicotine patch 21 mg daily for 4 weeks, then taper to 14 mg X 4 weeks then taper to  7 mg daily for 4 weeks. Then stop.      Medication List    STOP taking these medications        hydrochlorothiazide 25 MG tablet  Commonly known as:  HYDRODIURIL     lisinopril 20 MG tablet  Commonly known as:  PRINIVIL,ZESTRIL     oxyCODONE-acetaminophen 5-325 MG tablet  Commonly known as:  PERCOCET/ROXICET     senna 8.6 MG Tabs tablet  Commonly known as:  SENOKOT      TAKE these medications        acetaminophen 325 MG tablet  Commonly known as:  TYLENOL  Take 650 mg by mouth every 6 (six) hours as needed for mild pain, fever or headache.     aspirin EC 81 MG tablet  Take 1 tablet (81 mg total) by mouth daily.     cloZAPine 100 MG tablet  Commonly known as:  CLOZARIL  Take 1-4 tablets (100-400 mg total) by mouth 3 (three) times daily. Take 1 tablet (100 mg) by mouth daily at 8am and  4pm, take 4 tablets (400 mg) at bedtime     dicyclomine 20 MG tablet  Commonly known as:  BENTYL  Take 20 mg by mouth 2 (two) times daily as needed for spasms.     donepezil 5 MG tablet  Commonly known as:  ARICEPT  Take 5 mg by mouth at bedtime.     doxycycline 100 MG tablet  Commonly known as:  VIBRA-TABS  Take 1 tablet (100 mg total) by mouth 2 (two) times daily. X  2 weeks     gabapentin 300 MG capsule  Commonly known as:  NEURONTIN  Take 300 mg by mouth 3 (three) times daily.     guaifenesin 100 MG/5ML syrup  Commonly known as:  ROBITUSSIN  Take 200 mg by mouth 4 (four) times daily as needed for cough.     haloperidol 5 MG tablet  Commonly known as:  HALDOL  Take 1 tablet (5 mg total) by mouth at bedtime.     hydrocerin Crea  Apply 301 application topically 2 (two) times daily.     ibuprofen 200 MG tablet  Commonly known as:  ADVIL,MOTRIN  Take 400 mg by mouth 2 (two) times daily as needed (pain).     ipratropium-albuterol 0.5-2.5 (3) MG/3ML Soln  Commonly known as:  DUONEB  Take 3  mLs by nebulization 3 (three) times daily.     lactulose 10 GM/15ML solution  Commonly known as:  CHRONULAC  Take 30 g by mouth 2 (two) times daily.     metFORMIN 500 MG tablet  Commonly known as:  GLUCOPHAGE  Take 500 mg by mouth 2 (two) times daily with a meal.     metoprolol tartrate 25 MG tablet  Commonly known as:  LOPRESSOR  Take 25 mg by mouth 2 (two) times daily.     multivitamin with minerals Tabs tablet  Take 1 tablet by mouth daily.     mupirocin ointment 2 %  Commonly known as:  BACTROBAN  Apply topically daily.     pantoprazole 40 MG tablet  Commonly known as:  PROTONIX  Take 1 tablet (40 mg total) by mouth daily.     polyethylene glycol packet  Commonly known as:  MIRALAX / GLYCOLAX  Take 17 g by mouth daily as needed (constipation). Mix in 8 oz liquid and drink     PROAIR HFA 108 (90 Base) MCG/ACT inhaler  Generic drug:  albuterol  Inhale 2 puffs into the lungs every 6 (six) hours as needed for shortness of breath (cough).     senna-docusate 8.6-50 MG tablet  Commonly known as:  Senokot-S  Take 2 tablets by mouth at bedtime.     simvastatin 5 MG tablet  Commonly known as:  ZOCOR  Take 5 mg by mouth at bedtime.     traMADol 50 MG tablet--Rx # 20 pills.  Commonly known as:  ULTRAM  Take 1 tablet (50 mg total) by mouth every 6 (six) hours as needed for severe pain (or  unrelieved by tylenol).     trihexyphenidyl 5 MG tablet  Commonly known as:  ARTANE  Take 1 tablet (5 mg total) by mouth 2 (two) times daily with a meal.       Follow-up Information    Follow up with Ankit Lorie Phenix, MD.   Specialty:  Physical Medicine and Rehabilitation   Why:  office will call you with follow up appointment.    Contact information:   [place]  [place] [address] [phone]       Follow up with Newt Minion, MD On 09/15/2015.   Specialty:  Orthopedic Surgery   Why:  Be there  at 10:00 am follow appointment.   Contact information:   [place] [address] [phone]       Follow up with Mabeline Caras, NP On 09/09/2015.   Specialty:  Nurse Practitioner   Why:  [facility] will make post-hospital f/u appointment for patient.   Contact information:   [place] [address] [phone]       Signed: Bary Leriche 09/09/2015, 11:38 AM

## 2015-09-08 NOTE — Progress Notes (Signed)
Physical Therapy Discharge Summary   Patient Details  Name: Craig Bentley MRN: [id number] Date of Birth: 08/31/1955  Today's Date: 09/08/2015 PT Individual Time: 0903-1001 and 8101-7510 PT Individual Time Calculation (min): 58 min and 70 min   Patient has met 6 of 8 long term goals due to improved activity tolerance, improved balance, improved postural control, increased strength and improved awareness.  Patient to discharge at a wheelchair level Supervision in community, but Mod I in controlled environment.   Patient is discharging to an ALF where staff can provide supervision & the assistance pt requires.   Reasons goals not met: pt requires supervision for safety   Recommendation:  Patient does not qualify for additional therapy services following d/c from CIR. Pt has been given LE HEP sheet.  Equipment: w/c, amputee support pad, RW, 3-in-1 BSC  Reasons for discharge: treatment goals met  Patient/family agrees with progress made and goals achieved: Yes  Skilled PT treatment: Treatment 1:  Pt received in w/c & agreeable to PT, denying c/o pain. Pt able to propel w/c x 200 ft in controlled environment with Mod I, but requires supervision for w/c set up for transfers. Pt requires cuing for sequencing of w/c set up & parts management. Pt able to complete sit<>stand transfers with steady A & squat pivot with supervision. Gait training x 30 ft with RW & min A; pt initially able to clear LLE but as activity continues pt with forward flexed posture and decreased LLE foot clearance. Pt required cuing for upright posture, to square up to w/c & reach back with BUE before transferring stand>sit. Pt able to complete bed mobility (rolling L<>R, supine<>sit) with Mod I and car transfer with supervision A via squat pivot. Pt able to negotiate 4 steps (3") with B rails & min A but cuing for safety as pt attempted to hop down sideways before turning around. Pt able to recall HEP and location of sheet. At end  of session pt left sitting in w/c in room with all needs within reach.   Treatment 2: Pt received in w/c in family room & agreeable to PT. Pt requested to go outside; pt able to propel w/c off of unit & outside requiring cuing x 1 occasion to back onto elevator instead of entering forwards. Educated pt to push with BUE, let go of wheels, reach back & hold wheels to push again as pt was creating friction between hands & wheels, slowing himself down between pushes. Pt able to propel over even & uneven surfaces, and indoor ramp, with supervision A. Educated pt on need to perform pressure relieving techniques & did so by performing w/c pushups 5 reps x 2; educated pt on need to perform these every 20 minutes throughout day. Discussed pt's need for assistance with all functional mobility (transfers w/c<>bed, etc.) to ensure safety with transfers & pt voiced understanding. Back in gym pt performed standing RLE hip extension & abduction exercises with steady A and multimodal cuing for posture & proper technique. Pt also required maximum cuing to reach back for w/c with BUE prior to sitting as pt is impulsive with transfers. At end of session pt left sitting in w/c in family room with RN aware of pt's location.   PT Discharge Precautions/Restrictions Precautions Precaution Comments: R BKA Restrictions Weight Bearing Restrictions: Yes RLE Weight Bearing: Non weight bearing  Pain Pain Assessment Pain Assessment: No/denies pain Pain Score: 0-No pain  Vision/Perception  Per pt report he does not wear glasses & reports  no changes in baseline vision.  Cognition Orientation Level: Oriented to person;Oriented to place;Oriented to time;Oriented to situation Safety/Judgment: Impaired (will require intermittent supervision at d/c)   Sensation Sensation Light Touch: Appears Intact (BLE; does report numbness & tingling in RLE) Proprioception: Appears Intact (LLE) Coordination Gross Motor Movements are Fluid  and Coordinated: Yes  Mobility Bed Mobility Bed Mobility: Rolling Left;Rolling Right;Sit to Supine;Supine to Sit Rolling Right: 6: Modified independent (Device/Increase time) Rolling Left: 6: Modified independent (Device/Increase time) Supine to Sit: 6: Modified independent (Device/Increase time) Sit to Supine: 6: Modified independent (Device/Increase time) Transfers Transfers: Yes Sit to Stand: 5: Supervision Squat Pivot Transfers: 5: Supervision  Locomotion  Ambulation Ambulation: Yes Ambulation/Gait Assistance: 4: Min guard Ambulation Distance (Feet): 30 Feet Assistive device: Rolling walker Ambulation/Gait Assistance Details: Verbal cues for sequencing;Verbal cues for technique Gait Gait: Yes Gait Pattern: Step-to pattern (decreased gait speed) Stairs / Additional Locomotion Stairs: Yes Stairs Assistance: 4: Min assist Stair Management Technique: Two rails Number of Stairs: 4 Height of Stairs: 3 (inches) Wheelchair Mobility Wheelchair Mobility: Yes Wheelchair Assistance: 6: Modified independent (Device/Increase time) (in controlled environment, supervision for community) Environmental health practitioner: Both upper extremities Wheelchair Parts Management: Supervision/cueing Distance: 300   Balance Balance Balance Assessed: Yes Theatre stage manager Standing - Balance Support: Bilateral upper extremity supported Static Standing - Level of Assistance: 4: Min assist;5: Stand by assistance  Extremity Assessment  RLE AROM (degrees) RLE Overall AROM Comments:  (WFL) RLE Strength Right Hip Flexion: 3+/5 Right Knee Flexion: 3+/5 (testing limited by pain with pressure) Right Knee Extension: 3+/5 (testing limited by pain with pressure) LLE Assessment LLE Assessment: Exceptions to WFL LLE AROM (degrees) Overall AROM Left Lower Extremity: Within functional limits for tasks assessed LLE Strength Left Hip Flexion: 4-/5 Left Knee Flexion: 4/5 Left Knee Extension:  4-/5 Left Ankle Dorsiflexion: 4-/5   See Function Navigator for Current Functional Status.  Craig Bentley 09/08/2015, 9:10 AM

## 2015-09-08 NOTE — Progress Notes (Signed)
Occupational Therapy Discharge Summary  Patient Details  Name: Craig Bentley MRN: [id number] Date of Birth: Jun 15, 1955  Today's Date: 09/08/2015 OT Individual Time:  - 1324-4010 Treatment Time: 61 minutes      Patient has met 8 of 8 long term goals due to improved activity tolerance, improved balance, postural control, ability to compensate for deficits and improved coordination.  Patient to discharge at overall Supervision level.  Patient's discharge location is to an ALF which will provide the pt with 24/7 supervision for basic self care completion.    Recommendation:  No follow up OT recommendations at this time.  Equipment: Bedside commode  Reasons for discharge: discharge from hospital  Patient/family agrees with progress made and goals achieved: Yes  Skilled OT Intervention:  Patient was lying in bed at start of tx to wait for nursing to wrap residual limb. While nursing was preparing to enter room, pt was agreeable to complete BADLs at sink. Pt completed supine to sit and functional squat pivot transfer from bed to w/c with supervision. At start of session, pt was reminded of safety techniques during ADL completion to promote carryover during grad day. Pt gathered necessary ADL items w/c level demonstrating good w/c safety with supervision. Pt completed UB/LB bathing (including base of residual limb per nursing recommendation) with supervision. Pt completed UB dressing with Mod I and LB dressing with supervision for instruction on carryover of energy conservation techniques to promote increased thoroughness with pericare. Pt completed 2 squat pivot toilet transfers to [facility] over toilet with close supervision. Squat pivot completed to [facility] in zero entry shower with use of grab bars and armrests with pt requiring min vcs for proper w/c placement. Pt was provided education on goal achievement and overall progress from time of eval with verbalized understanding and feelings of accomplishment.  Pt left in w/c at end of session with all needs within reach.     OT Discharge Precautions/Restrictions  Precautions Precautions: Fall Restrictions Weight Bearing Restrictions: Yes RLE Weight Bearing: Non weight bearing (for R LE) General   Vital Signs  Pain Pain Assessment Pain Assessment: 0-10 Pain Score: 0-No pain Pain Type: Acute pain;Surgical pain;Phantom pain Pain Location: Leg Pain Orientation: Right Pain Descriptors / Indicators: Numbness;Pins and needles;Tingling Pain Frequency: Intermittent Pain Onset: Gradual Patients Stated Pain Goal: 1 Pain Intervention(s): Medication (See eMAR) ADL ADL Eating: Modified independent Where Assessed-Eating: Wheelchair Grooming: Modified independent Where Assessed-Grooming: Sitting at sink Upper Body Bathing: Supervision/safety Where Assessed-Upper Body Bathing: Sitting at sink Lower Body Bathing: Supervision/safety Where Assessed-Lower Body Bathing: Sitting at sink Upper Body Dressing: Modified independent (Device) Where Assessed-Upper Body Dressing: Wheelchair Lower Body Dressing: Supervision/safety Where Assessed-Lower Body Dressing: Wheelchair Toileting: Supervision/safety Where Assessed-Toileting: Teacher, adult education: Close supervision Toilet Transfer Method: Ambulance person: Raised toilet seat Tub/Shower Transfer: Close supervison Web designer Method: Conservation officer, historic buildings: Emergency planning/management officer, Grab bars ADL Comments: Refer to functional navigator Vision/Perception  Vision- History Patient Visual Report: No change from baseline Vision- Assessment Vision Assessment?: No apparent visual deficits  Cognition Overall Cognitive Status: Within Functional Limits for tasks assessed Arousal/Alertness: Awake/alert Orientation Level: Oriented to person;Oriented to place;Oriented to time;Oriented to situation Attention: Sustained Sustained Attention: Appears intact Memory: Appears  intact Awareness: Appears intact Problem Solving: Impaired Safety/Judgment: Impaired Sensation Sensation Light Touch: Appears Intact Stereognosis: Appears Intact Hot/Cold: Appears Intact Proprioception: Appears Intact Coordination Gross Motor Movements are Fluid and Coordinated: Yes Fine Motor Movements are Fluid and Coordinated: Yes Motor  Motor Motor: Within Functional Limits  Mobility  Transfers Sit to Stand: 5: Supervision Sit to Stand Details: Verbal cues for technique;Verbal cues for sequencing;Verbal cues for precautions/safety  Trunk/Postural Assessment  Cervical Assessment Cervical Assessment: Within Functional Limits Thoracic Assessment Thoracic Assessment: Within Functional Limits Lumbar Assessment Lumbar Assessment: Within Functional Limits Postural Control Postural Control: Within Functional Limits  Balance Balance Balance Assessed: Yes Dynamic Sitting Balance Dynamic Sitting - Balance Support: No upper extremity supported Dynamic Sitting - Level of Assistance: 6: Modified independent (Device/Increase time) Static Standing Balance Static Standing - Balance Support: Right upper extremity supported Static Standing - Level of Assistance: 5: Stand by assistance Dynamic Standing Balance Dynamic Standing - Balance Support: Bilateral upper extremity supported Dynamic Standing - Level of Assistance: 5: Stand by assistance Extremity/Trunk Assessment RUE Assessment RUE Assessment: Within Functional Limits LUE Assessment LUE Assessment: Within Functional Limits   See Function Navigator for Current Functional Status.  Devontae Casasola A Sadrac Zeoli 09/08/2015, 4:31 PM

## 2015-09-09 LAB — CBC WITH DIFFERENTIAL/PLATELET
Basophils Absolute: 0 10*3/uL (ref 0.0–0.1)
Basophils Relative: 0 %
Eosinophils Absolute: 0 10*3/uL (ref 0.0–0.7)
Eosinophils Relative: 0 %
HEMATOCRIT: 31.8 % — AB (ref 39.0–52.0)
HEMOGLOBIN: 9.6 g/dL — AB (ref 13.0–17.0)
LYMPHS ABS: 2.7 10*3/uL (ref 0.7–4.0)
Lymphocytes Relative: 25 %
MCH: 26.1 pg (ref 26.0–34.0)
MCHC: 30.2 g/dL (ref 30.0–36.0)
MCV: 86.4 fL (ref 78.0–100.0)
MONOS PCT: 7 %
Monocytes Absolute: 0.7 10*3/uL (ref 0.1–1.0)
NEUTROS ABS: 7.4 10*3/uL (ref 1.7–7.7)
NEUTROS PCT: 68 %
Platelets: 368 10*3/uL (ref 150–400)
RBC: 3.68 MIL/uL — ABNORMAL LOW (ref 4.22–5.81)
RDW: 15.3 % (ref 11.5–15.5)
WBC: 10.8 10*3/uL — ABNORMAL HIGH (ref 4.0–10.5)

## 2015-09-09 LAB — GLUCOSE, CAPILLARY
Glucose-Capillary: 149 mg/dL — ABNORMAL HIGH (ref 65–99)
Glucose-Capillary: 174 mg/dL — ABNORMAL HIGH (ref 65–99)
Glucose-Capillary: 103 mg/dL — ABNORMAL HIGH (ref 65–99)

## 2015-09-09 LAB — BASIC METABOLIC PANEL
ANION GAP: 7 (ref 5–15)
BUN: 11 mg/dL (ref 6–20)
CHLORIDE: 102 mmol/L (ref 101–111)
CO2: 31 mmol/L (ref 22–32)
Calcium: 9.5 mg/dL (ref 8.9–10.3)
Creatinine, Ser: 0.8 mg/dL (ref 0.61–1.24)
GFR calc non Af Amer: >60 mL/min (ref >60)
Glucose, Bld: 131 mg/dL — ABNORMAL HIGH (ref 65–99)
Potassium: 4 mmol/L (ref 3.5–5.1)
Sodium: 140 mmol/L (ref 135–145)

## 2015-09-09 MED ORDER — NICOTINE 21 MG/24HR TD PT24: 21.0000 mg | MEDICATED_PATCH | Freq: Every day | TRANSDERMAL | Status: DC

## 2015-09-09 MED ORDER — NICOTINE 21 MG/24HR TD PT24
21.0000 mg | MEDICATED_PATCH | Freq: Every day | TRANSDERMAL | Status: DC
  Administered 2015-09-09: 21 mg via TRANSDERMAL
  Filled 2015-09-09: qty 1

## 2015-09-09 MED ORDER — MUPIROCIN 2 % EX OINT: TOPICAL_OINTMENT | Freq: Every day | CUTANEOUS | Status: DC

## 2015-09-09 MED ORDER — HYDROCERIN EX CREA: 113.0000 "application " | TOPICAL_CREAM | Freq: Two times a day (BID) | CUTANEOUS | Status: DC

## 2015-09-09 MED ORDER — TRAMADOL HCL 50 MG PO TABS: 50.0000 mg | ORAL_TABLET | Freq: Four times a day (QID) | ORAL | Status: DC | PRN

## 2015-09-09 MED ORDER — DOXYCYCLINE HYCLATE 100 MG PO TABS
100.0000 mg | ORAL_TABLET | Freq: Two times a day (BID) | ORAL | Status: DC
Start: 2015-09-09 — End: 2016-03-24

## 2015-09-09 MED ORDER — SENNOSIDES-DOCUSATE SODIUM 8.6-50 MG PO TABS: 2.0000 | ORAL_TABLET | Freq: Every day | ORAL | Status: AC

## 2015-09-09 NOTE — Progress Notes (Signed)
Patient discharged to [facility]. Report called.

## 2015-09-09 NOTE — Discharge Instructions (Signed)
Inpatient Rehab Discharge Instructions  Craig Bentley Discharge date and time: 09/09/15   Activities/Precautions/ Functional Status: Activity: activity as tolerated Diet: diabetic diet Wound Care: Wash with soap and water. Pat dry. Apply dry compressive dressing. Contact MD if you develop any problems with your incision/wound--redness, swelling, increase in pain, drainage or if you develop fever or chills.   Functional status:  ___ No restrictions     ___ Walk up steps independently _X__ 24/7 supervision/assistance   ___ Walk up steps with assistance ___ Intermittent supervision/assistance  ___ Bathe/dress independently ___ Walk with walker     _X__ Bathe/dress with assistance ___ Walk Independently    ___ Shower independently _X__ Walk with assistance    ___ Shower with assistance _X__ No alcohol     ___ Return to work/school ________  Special Instructions: 1. Check blood sugars twice a day.  2. Absolutely no smoking or other tobacco products--till will cause problems with wound healing and breakdown.    My questions have been answered and I understand these instructions. I will adhere to these goals and the provided educational materials after my discharge from the hospital.  Patient/Caregiver Signature _______________________________ Date __________  Clinician Signature _______________________________________ Date __________  Please bring this form and your medication list with you to all your follow-up doctor's appointments.

## 2015-09-09 NOTE — NC FL2 (Signed)
[place] MEDICAID FL2 LEVEL OF CARE SCREENING TOOL     IDENTIFICATION  Patient Name: Craig Bentley Birthdate: 16-Mar-1955 Sex: male Admission Date (Current Location): 08/28/2015  [place] and [place] Number:  Haynes Bast [id number] O Facility and Address:  [facility] [place], [address] [place], [place] [address]      Provider Number: [id number]  Attending Physician Name and Address:  Ankit Karis Juba, MD  Relative Name and Phone Number:  Jaizon Graffeo - sister - [phone]    Current Level of Care: Hospital Recommended Level of Care: Assisted Living Facility Prior Approval Number:    Date Approved/Denied:   PASRR Number: [id number]  Discharge Plan: Domiciliary (Rest home)    Current Diagnoses: Patient Active Problem List   Diagnosis Date Noted  . Low grade fever   . Surgical wound dehiscence   . Opacity of lung on imaging study   . Acute blood loss anemia   . Fever   . SIRS (systemic inflammatory response syndrome) (HCC)   . Leukocytosis   . Status post below knee amputation of right lower extremity (HCC) 08/28/2015  . 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HCC) 08/23/2015  . Sepsis (HCC) 08/21/2015  . ARF (acute renal failure) (HCC) 08/21/2015  . Acute encephalopathy 08/21/2015  . Post-operative state   . Respiratory failure (HCC)   . Surgery, elective   . Anxiety   . Schizophrenia (HCC)   . Atherosclerosis of extremity with gangrene (HCC)   . Hyperglycemia   . Preoperative cardiovascular examination 04/11/2015  . PVD (peripheral vascular disease) (HCC) 04/10/2015  . Absolute anemia   . Diabetic foot infection (HCC) 04/02/2015  . Right foot pain 04/02/2015  . Neutrophilic leukocytosis 04/02/2015  . Esophageal abnormality   . HCAP (healthcare-associated pneumonia)   . COPD exacerbation (HCC)   . Dyspnea   . Respiratory distress   . Sepsis due to pneumonia (HCC) 02/14/2015  . Acute respiratory failure with hypoxia (HCC) 02/13/2015  . Diabetes  mellitus with neurologic complication, without long-term current use of insulin (HCC) 07/20/2011  . Essential hypertension   . COPD (chronic obstructive pulmonary disease) (HCC)     Orientation RESPIRATION BLADDER Height & Weight     Self, Time, Situation, Place  Normal Continent Weight: 70.1 kg (154 lb 8.7 oz) Height:  5\' 11"  (180.3 cm)  BEHAVIORAL SYMPTOMS/MOOD NEUROLOGICAL BOWEL NUTRITION STATUS      Continent Diet (carb modified)  AMBULATORY STATUS COMMUNICATION OF NEEDS Skin   Limited Assist Verbally Surgical wounds (home health to monitor, clean, and dress amputation site wound - home health agency notified and orders faxed)                       Personal Care Assistance Level of Assistance  Bathing, Dressing (supervision) Bathing Assistance: Limited assistance Feeding assistance: Independent Dressing Assistance: Limited assistance     Functional Limitations Info    Sight Info: Adequate        SPECIAL CARE FACTORS FREQUENCY  PT (By licensed PT)     PT Frequency: 3X/week             Contractures Contractures Info: Not present    Additional Factors Info    Code Status Info: full Allergies Info: NKA Psychotropic Info: Haldol Insulin Sliding Scale Info: metformin - no sliding scale       Current Medications (09/09/2015):  This is the current hospital active medication list Current Facility-Administered Medications  Medication Dose Route Frequency Provider Last Rate Last Dose  .  acetaminophen (TYLENOL) tablet 650 mg  650 mg Oral Q6H PRN Jacquelynn Cree, PA-C   650 mg at 08/29/15 0449   Or  . acetaminophen (TYLENOL) suppository 650 mg  650 mg Rectal Q6H PRN Jacquelynn Cree, PA-C      . alum & mag hydroxide-simeth (MAALOX/MYLANTA) 200-200-20 MG/5ML suspension 30 mL  30 mL Oral Q4H PRN Jacquelynn Cree, PA-C      . bisacodyl (DULCOLAX) EC tablet 5 mg  5 mg Oral Daily PRN Jacquelynn Cree, PA-C   5 mg at 09/06/15 1329  . cephALEXin (KEFLEX) capsule 250 mg  250 mg  Oral Q8H Pamela S Love, PA-C   250 mg at 09/09/15 0454  . cloZAPine (CLOZARIL) tablet 100 mg  100 mg Oral 2 times per day Jacquelynn Cree, PA-C   100 mg at 09/09/15 0804  . cloZAPine (CLOZARIL) tablet 400 mg  400 mg Oral QHS Evlyn Kanner Love, PA-C   400 mg at 09/08/15 2226  . dicyclomine (BENTYL) tablet 20 mg  20 mg Oral BID PRN Jacquelynn Cree, PA-C      . diphenhydrAMINE (BENADRYL) 12.5 MG/5ML elixir 12.5-25 mg  12.5-25 mg Oral Q6H PRN Jacquelynn Cree, PA-C      . donepezil (ARICEPT) tablet 5 mg  5 mg Oral QHS Evlyn Kanner Love, PA-C   5 mg at 09/08/15 2230  . doxycycline (VIBRA-TABS) tablet 100 mg  100 mg Oral Q12H Evlyn Kanner Love, PA-C   100 mg at 09/09/15 0865  . enoxaparin (LOVENOX) injection 40 mg  40 mg Subcutaneous Q24H Pamela S Love, PA-C   40 mg at 09/08/15 1807  . gabapentin (NEURONTIN) capsule 300 mg  300 mg Oral TID Jacquelynn Cree, PA-C   300 mg at 09/09/15 7846  . guaiFENesin-dextromethorphan (ROBITUSSIN DM) 100-10 MG/5ML syrup 5-10 mL  5-10 mL Oral Q6H PRN Jacquelynn Cree, PA-C      . haloperidol (HALDOL) tablet 5 mg  5 mg Oral QHS Pamela S Love, PA-C   5 mg at 09/08/15 2225  . hydrocerin (EUCERIN) cream   Topical BID Evlyn Kanner Love, PA-C      . insulin aspart (novoLOG) injection 0-9 Units  0-9 Units Subcutaneous TID WC Jacquelynn Cree, PA-C   1 Units at 09/08/15 1806  . ipratropium-albuterol (DUONEB) 0.5-2.5 (3) MG/3ML nebulizer solution 3 mL  3 mL Nebulization Q4H PRN Evlyn Kanner Love, PA-C      . lactulose (CHRONULAC) 10 GM/15ML solution 30 g  30 g Oral BID Evlyn Kanner Love, PA-C   30 g at 09/09/15 0805  . methocarbamol (ROBAXIN) tablet 500 mg  500 mg Oral Q6H PRN Jacquelynn Cree, PA-C      . metoprolol tartrate (LOPRESSOR) tablet 25 mg  25 mg Oral BID Jacquelynn Cree, PA-C   25 mg at 09/09/15 9629  . mupirocin ointment (BACTROBAN) 2 %   Topical Daily Kathryne Hitch, MD      . nicotine (NICODERM CQ - dosed in mg/24 hours) patch 21 mg  21 mg Transdermal Daily Pamela S Love, PA-C      . ondansetron  [facility]) tablet 4 mg  4 mg Oral Q6H PRN Jacquelynn Cree, PA-C       Or  . ondansetron [facility]) injection 4 mg  4 mg Intravenous Q6H PRN Evlyn Kanner Love, PA-C      . oxyCODONE (Oxy IR/ROXICODONE) immediate release tablet 5 mg  5 mg Oral Q6H PRN Jacquelynn Cree,  PA-C   5 mg at 09/09/15 0454  . pantoprazole (PROTONIX) EC tablet 40 mg  40 mg Oral Daily Jacquelynn Cree, PA-C   40 mg at 09/09/15 0804  . senna-docusate (Senokot-S) tablet 2 tablet  2 tablet Oral QHS Jacquelynn Cree, PA-C   2 tablet at 09/08/15 2225  . simvastatin (ZOCOR) tablet 5 mg  5 mg Oral QHS Evlyn Kanner Love, PA-C   5 mg at 09/08/15 2226  . sodium phosphate (FLEET) 7-19 GM/118ML enema 1 enema  1 enema Rectal Once PRN Jacquelynn Cree, PA-C      . traMADol Janean Sark) tablet 50 mg  50 mg Oral Q6H PRN Evlyn Kanner Love, PA-C   50 mg at 09/09/15 0810  . traZODone (DESYREL) tablet 25-50 mg  25-50 mg Oral QHS PRN Jacquelynn Cree, PA-C   50 mg at 09/07/15 2054  . trihexyphenidyl (ARTANE) tablet 5 mg  5 mg Oral BID WC Evlyn Kanner Love, PA-C   5 mg at 09/09/15 0803     Discharge Medications: Please see discharge summary for a list of discharge medications.  Relevant Imaging Results:  Relevant Lab Results:   Additional Information SSN: [id number]  Hanna Aultman, [place], LCSW

## 2015-09-09 NOTE — Progress Notes (Signed)
[facility]     PROGRESS NOTE  Subjective/Complaints:  Pt resting in bed.  He is very thankful for his care and ready for discharge.   ROS: Denies CP, SOB, N/V/D.  Objective: Vital Signs: Blood pressure 137/67, pulse 88, temperature 99.1 F (37.3 C), temperature source Oral, resp. rate 18, height '5\' 11"'$  (1.803 m), weight 70.1 kg (154 lb 8.7 oz), SpO2 96 %. No results found.  Recent Labs  09/09/15 0715  WBC 10.8*  HGB 9.6*  HCT 31.8*  PLT 368    Recent Labs  09/09/15 0715  NA 140  K 4.0  CL 102  GLUCOSE 131*  BUN 11  CREATININE 0.80  CALCIUM 9.5   CBG (last 3)   Recent Labs  09/07/15 2057 09/08/15 0642 09/08/15 1128  GLUCAP 154* 126* 157*    Wt Readings from Last 3 Encounters:  09/09/15 70.1 kg (154 lb 8.7 oz)  08/28/15 69.6 kg (153 lb 7 oz)  05/16/15 78.926 kg (174 lb)    Physical Exam:  BP 137/67 mmHg  Pulse 88  Temp(Src) 99.1 F (37.3 C) (Oral)  Resp 18  Ht '5\' 11"'$  (1.803 m)  Wt 70.1 kg (154 lb 8.7 oz)  BMI 21.56 kg/m2  SpO2 96% Constitutional: He appears well-developed and well-nourished.  HENT: Normocephalic and atraumatic.  Eyes: Conjunctivae and EOM are normal. No scleral icterus.  Cardiovascular: Normal rate and regular rhythm.  Respiratory: Bilateral clear to ausculation. Effort normal. No stridor. He exhibits no tenderness.  GI: Soft. Bowel sounds are normal. He exhibits no distension. There is no tenderness.  Musculoskeletal: He exhibits mild edema. R-BKA.  Neurological: He is alert and oriented x3.  Able to follow basic commands without difficulty.  Motor: 4+-5/5 throughout B/l and LLE: RLE 4+-5/5 HF Skin: Skin is warm and dry.   Staples with central area of dehiscence (stable) without drainage  Dry skin to sacral area, no breakdown Psychiatric: He has a normal mood and affect. His behavior is normal.   Assessment/Plan: 1. Functional deficits secondary to PAD, s/p right BKA which require 3+  hours per day of interdisciplinary therapy in a comprehensive inpatient rehab setting. Physiatrist is providing close team supervision and 24 hour management of active medical problems listed below. Physiatrist and rehab team continue to assess barriers to discharge/monitor patient progress toward functional and medical goals.  Function:  Bathing Bathing position   Position: Wheelchair/chair at sink  Bathing parts Body parts bathed by patient: Right arm, Left arm, Chest, Abdomen, Front perineal area, Buttocks, Right upper leg, Left upper leg, Left lower leg, Back Body parts bathed by helper: Buttocks, Back  Bathing assist Assist Level: Supervision or verbal cues      Upper Body Dressing/Undressing Upper body dressing   What is the patient wearing?: Button up shirt     Pull over shirt/dress - Perfomed by patient: Thread/unthread right sleeve, Thread/unthread left sleeve, Put head through opening, Pull shirt over trunk          Upper body assist Assist Level: More than reasonable time   Set up : To obtain clothing/put away  Lower Body Dressing/Undressing Lower body dressing   What is the patient wearing?: Underwear, Pants, Non-skid slipper socks Underwear - Performed by patient: Thread/unthread right underwear leg, Thread/unthread left underwear leg, Pull underwear up/down   Pants- Performed by patient: Thread/unthread right pants leg, Thread/unthread left pants leg, Pull pants up/down Pants- Performed by helper: Thread/unthread right pants leg Non-skid slipper socks- Performed by  patient: Don/doff left sock Non-skid slipper socks- Performed by helper: Don/doff left sock                  Lower body assist Assist for lower body dressing: Supervision or verbal cues      Toileting Toileting   Toileting steps completed by patient: Adjust clothing prior to toileting Toileting steps completed by helper: Performs perineal hygiene, Adjust clothing after toileting Toileting  Assistive Devices: Grab bar or rail  Toileting assist Assist level: Touching or steadying assistance (Pt.75%)   Transfers Chair/bed transfer   Chair/bed transfer method: Squat pivot Chair/bed transfer assist level: Supervision or verbal cues Chair/bed transfer assistive device: Armrests     Locomotion Ambulation Ambulation activity did not occur: Refused   Max distance: 30 ft  Assist level: Touching or steadying assistance (Pt > 75%)   Wheelchair   Type: Manual Max wheelchair distance: 200 ft Assist Level: Supervision or verbal cues  Cognition Comprehension Comprehension assist level: Follows basic conversation/direction with no assist  Expression Expression assist level: Expresses basic needs/ideas: With no assist  Social Interaction Social Interaction assist level: Interacts appropriately with others with medication or extra time (anti-anxiety, antidepressant).  Problem Solving Problem solving assist level: Solves basic 90% of the time/requires cueing < 10% of the time  Memory Memory assist level: Recognizes or recalls 75 - 89% of the time/requires cueing 10 - 24% of the time    Medical Problem List and Plan: 1. Functional and mobility deficits secondary to PAD, s/p right BKA on 7/5  D/c today  Will see patient for transitional care management in 1-2 weeks. 2. DVT Prophylaxis/Anticoagulation: lovenox '40mg'$  sq daily 3. Pain Management: pt reports good pain control at present.  tramadol/oxycodone '5mg'$  q4-6 prn.  robaxin '500mg'$  prn muscle spasms.  gabapentin for peripheral neuropathic pain 4. Mood:  -haldol, aricept, clozapine 5. Neuropsych: This patient is capable of making decisions on his own behalf. 6. Skin/Wound Care: Vac d/ced on 7/11 to amputation site.   Wound dehiscence, Ortho evaluated, appreciate recs. Will d/c abx and local wound care. 7. Fluids/Electrolytes/Nutrition: encourage po, check admission labs 8.  Acute Renal Failure:  Resolved on 7/7 9. 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 Essential HTN - metoprolol '25mg'$  bid 11. DM2---diet controlled.  Cont to monitor, will consider medications if necessary  SSI as appropriate 12. Acute encephalopathy--?resolved 13. Fever: Afebrile at present  WBCs 10.8 on 7/18 (improved)  UA with some bacteria, Ucx insignificant growth  CXR 7/7 reviewed, suggesting PNA, repeat CXR reviewed with stable opacity, likely chronic  Encourage IS 14. ABLA  Hb 9.6 on 7/18  Cont to monitor  LOS (Days) 12 A FACE TO FACE EVALUATION WAS PERFORMED  Gerritt Galentine Lorie Phenix 09/09/2015 9:02 AM

## 2015-09-09 NOTE — Progress Notes (Signed)
Social Work Discharge Note  The overall goal for the admission was met for:   Discharge location: Yes - return to ALF where pt resides  Length of Stay: Yes - 12 days  Discharge activity level: Yes - supervision with ADLs; modified independent in w/c at other times  Home/community participation: Yes - at [facility] provided included: MD, RD, PT, OT, RN, Pharmacy, Neuropsych and SW  Financial Services: Medicaid  Follow-up services arranged: Home Health: RN, DME: [phone]" lightweight w/c with right amputee support pad and basic cushion; 3-in-1 commode, and rolling walker and Patient/Family request agency HH: [facility] - was following pt PTA, DME: [place]  Comments (or additional information):  CSW has spoken with ALF Supervisor, Wells Guiles, and they are expecting pt back today.  They will transport pt in their facility's [facility] with pt's belongings and DME.  Home Health to do daily dressings for pt and they are aware of pt's return to ALF with orders sent via fax.  Sister has not been able to be reached during pt's entire stay due to non-working telephone number given, despite multiple attempts by CSW.  Patient/Family verbalized understanding of follow-up arrangements: Yes  Individual responsible for coordination of the follow-up plan: pt with support from ALF staff and [facility]  Confirmed correct DME delivered: Trey Sailors 09/09/2015    Rokhaya Quinn, Silvestre Mesi

## 2015-09-10 LAB — GLUCOSE, CAPILLARY: Glucose-Capillary: 143 mg/dL — ABNORMAL HIGH (ref 65–99)

## 2015-09-11 ENCOUNTER — Telehealth: Payer: Self-pay | Admitting: *Deleted

## 2015-09-11 NOTE — Telephone Encounter (Addendum)
Mr Mahaney is in ALF : [place]     09/11/15 2:05 Attempted to reach for 1st contact for transitional care call.  No answer at the number listed for [facility].  No voicemail available. 09/11/15 3:33 pm 2nd attempt to reach [facility].  No answer or voicemail 09/12/15 1:35  Pm  Contact made with [facility].  Mr Kloosterman was in shower.  Asked for call back to our office.  I gave the appointment date, time and address to staff. Transitional Care Questions   Questions for our staff to ask patients on Transitional care 48 hour phone call:   1. Are you/is patient experiencing any problems since coming home? Are there any questions regarding any aspect of care? 2. Are there any questions regarding medications administration/dosing? Are meds being taken as prescribed? Patient should review meds with caller to confirm 3. Have there been any falls? 4. Has Home Health been to the house and/or have they contacted you? If not, have you tried to contact them? Can we help you contact them? 5. Are bowels and bladder emptying properly? Are there any unexpected incontinence issues? If applicable, is patient following bowel/bladder programs? 6. Any fevers, problems with breathing, unexpected pain? 7. Are there any skin problems or new areas of breakdown? 8. Has the patient/family member arranged specialty MD follow up (ie cardiology/neurology/renal/surgical/etc)?  Can we help arrange? 9. Does the patient need any other services or support that we can help arrange? 10. Are caregivers following through as expected in assisting the patient? 11. Has the patient quit smoking, drinking alcohol, or using drugs as recommended?  Appointment with Dr Delice Lesch on 09/18/15 @ 2:20 pm arrive by 2 pm

## 2015-09-18 ENCOUNTER — Encounter: Payer: Self-pay | Admitting: Physical Medicine & Rehabilitation

## 2015-09-21 ENCOUNTER — Encounter (HOSPITAL_COMMUNITY): Payer: Self-pay | Admitting: Emergency Medicine

## 2015-09-21 ENCOUNTER — Emergency Department (HOSPITAL_COMMUNITY)
Admission: EM | Admit: 2015-09-21 | Discharge: 2015-09-22 | Disposition: A | Discharge status: Home / Self Care | Payer: Medicaid Other | Attending: Emergency Medicine | Admitting: Emergency Medicine

## 2015-09-21 DIAGNOSIS — Z7984 Long term (current) use of oral hypoglycemic drugs: Secondary | ICD-10-CM | POA: Insufficient documentation

## 2015-09-21 DIAGNOSIS — Y9289 Other specified places as the place of occurrence of the external cause: Secondary | ICD-10-CM | POA: Insufficient documentation

## 2015-09-21 DIAGNOSIS — Z7982 Long term (current) use of aspirin: Secondary | ICD-10-CM | POA: Diagnosis not present

## 2015-09-21 DIAGNOSIS — Z951 Presence of aortocoronary bypass graft: Secondary | ICD-10-CM | POA: Insufficient documentation

## 2015-09-21 DIAGNOSIS — Z89511 Acquired absence of right leg below knee: Secondary | ICD-10-CM | POA: Insufficient documentation

## 2015-09-21 DIAGNOSIS — T8781 Dehiscence of amputation stump: Secondary | ICD-10-CM | POA: Diagnosis not present

## 2015-09-21 DIAGNOSIS — E119 Type 2 diabetes mellitus without complications: Secondary | ICD-10-CM | POA: Insufficient documentation

## 2015-09-21 DIAGNOSIS — Z79899 Other long term (current) drug therapy: Secondary | ICD-10-CM | POA: Insufficient documentation

## 2015-09-21 DIAGNOSIS — I10 Essential (primary) hypertension: Secondary | ICD-10-CM | POA: Insufficient documentation

## 2015-09-21 DIAGNOSIS — Y9389 Activity, other specified: Secondary | ICD-10-CM | POA: Diagnosis not present

## 2015-09-21 DIAGNOSIS — F17210 Nicotine dependence, cigarettes, uncomplicated: Secondary | ICD-10-CM | POA: Insufficient documentation

## 2015-09-21 DIAGNOSIS — Y999 Unspecified external cause status: Secondary | ICD-10-CM | POA: Diagnosis not present

## 2015-09-21 DIAGNOSIS — Y828 Other medical devices associated with adverse incidents: Secondary | ICD-10-CM | POA: Diagnosis not present

## 2015-09-21 DIAGNOSIS — J441 Chronic obstructive pulmonary disease with (acute) exacerbation: Secondary | ICD-10-CM | POA: Diagnosis not present

## 2015-09-21 DIAGNOSIS — S81012A Laceration without foreign body, left knee, initial encounter: Secondary | ICD-10-CM | POA: Diagnosis present

## 2015-09-21 DIAGNOSIS — W19XXXA Unspecified fall, initial encounter: Secondary | ICD-10-CM

## 2015-09-21 NOTE — ED Triage Notes (Addendum)
Per EMS pt sent from [facility] for evaluation of Right BKA wound that incision opened after landing on leg during transfer from toilet to wheelchair. Bleeding was controlled prior to EMS arrival. Pt reported having BKA 1 month ago. No active bleeding at this time.

## 2015-09-21 NOTE — ED Notes (Signed)
[facility] called for transport.  

## 2015-09-21 NOTE — ED Notes (Signed)
Bed: RA30 Expected date:  Expected time:  Means of arrival:  Comments: BKA x 1 month ago/fell caused site to bleed

## 2015-09-21 NOTE — ED Provider Notes (Signed)
[facility] DEPT Provider Note   CSN: [id number] Arrival date & time: 09/21/15  2130  First Provider Contact:  None    By signing my name below, I, Craig Bentley, attest that this documentation has been prepared under the direction and in the presence of [name], NP.  Electronically Signed: Julien Nordmann, ED Scribe. 09/21/15. 10:09 PM.    History   Chief Complaint Chief Complaint  Patient presents with  . Laceration    The history is provided by the patient and the EMS personnel. No language interpreter was used.   HPI Comments: Craig Bentley is a 60 y.o. male brought in by ambulance with a PMhx of DM, fecal impaction, HTN and pneumonia, who presents to the Emergency Department presenting for evaluation of a wound dehiscence to right lower stump onset this evening. According to EMS, pt had a right knee amputation to the knee one month ago. They state he was transferring from the toilet to the wheelchair at [facility] when he fell and landed on his right stump. There was mild blood noted. Bleeding is currently controlled. He has no other complaints.  Past Medical History:  Diagnosis Date  . Anxiety   . Constipation   . COPD (chronic obstructive pulmonary disease) ([place])   . Diabetes mellitus   . Fecal impaction ([place])   . Hypertension   . Pneumonia   . Schizophrenia, schizo-affective [facility])     Patient Active Problem List   Diagnosis Date Noted  . Low grade fever   . Surgical wound dehiscence   . Opacity of lung on imaging study   . Acute blood loss anemia   . Fever   . SIRS (systemic inflammatory response syndrome) (HCC)   . Leukocytosis   . Status post below knee amputation of right lower extremity ([place]) 08/28/2015  . 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[place]) 08/23/2015  . Sepsis ([place]) 08/21/2015  . ARF (acute renal failure) ([place]) 08/21/2015  . Acute encephalopathy 08/21/2015  . Post-operative state   . Respiratory failure ([place])   . Surgery, elective   . Anxiety   .  Schizophrenia ([place])   . Atherosclerosis of extremity with gangrene ([place])   . Hyperglycemia   . Preoperative cardiovascular examination 04/11/2015  . PVD (peripheral vascular disease) ([place]) 04/10/2015  . Absolute anemia   . Diabetic foot infection ([place]) 04/02/2015  . Right foot pain 04/02/2015  . Neutrophilic leukocytosis 97/67/3419  . Esophageal abnormality   . HCAP (healthcare-associated pneumonia)   . COPD exacerbation ([place])   . Dyspnea   . Respiratory distress   . Sepsis due to pneumonia ([place]) 02/14/2015  . Acute respiratory failure with hypoxia ([place]) 02/13/2015  . Diabetes mellitus with neurologic complication, without long-term current use of insulin ([place]) 07/20/2011  . Essential hypertension   . COPD (chronic obstructive pulmonary disease) ([place])     Past Surgical History:  Procedure Laterality Date  . AMPUTATION Right 04/23/2015   Procedure: Right Fifth Toe Amputation;  Surgeon: Conrad [place], MD;  Location: [place];  Service: Vascular;  Laterality: Right;  . AMPUTATION Right 08/27/2015   Procedure: RIGHT BELOW KNEE AMPUTATION ;  Surgeon: Newt Minion, MD;  Location: [facility];  Service: Orthopedics;  Laterality: Right;  . AORTA - BILATERAL FEMORAL ARTERY BYPASS GRAFT Bilateral 04/23/2015   Procedure: AORTOBIFEMORAL BYPASS GRAFT;  Surgeon: Conrad , MD;  Location: [place];  Service: Vascular;  Laterality: Bilateral;  . FEMORAL-FEMORAL BYPASS GRAFT Left 04/23/2015   Procedure:  Left Common Femoral Artery to  Superficial Femoral Artery Bypass Graft with Vein;  Surgeon: Conrad Hooker, MD;  Location: [place];  Service: Vascular;  Laterality: Left;  . FEMORAL-TIBIAL BYPASS GRAFT Right 04/23/2015   Procedure: Right Common Femoral to Posterior Tibial Bypass with Composite Vein Graft. ;  Surgeon: Conrad [place], MD;  Location: [place];  Service: Vascular;  Laterality: Right;  . PERIPHERAL VASCULAR CATHETERIZATION N/A 04/07/2015   Procedure: Abdominal Aortogram;  Surgeon: Angelia Mould, MD;   Location: [place] [facility];  Service: Cardiovascular;  Laterality: N/A;       Home Medications    Prior to Admission medications   Medication Sig Start Date End Date Taking? Authorizing Provider  acetaminophen (TYLENOL) 325 MG tablet Take 650 mg by mouth every 6 (six) hours as needed for mild pain, fever or headache.     Historical Provider, MD  albuterol (PROAIR HFA) 108 (90 Base) MCG/ACT inhaler Inhale 2 puffs into the lungs every 6 (six) hours as needed for shortness of breath (cough).    Historical Provider, MD  aspirin EC 81 MG tablet Take 1 tablet (81 mg total) by mouth daily. 05/01/15   Alvia Grove, PA-C  cloZAPine (CLOZARIL) 100 MG tablet Take 1-4 tablets (100-400 mg total) by mouth 3 (three) times daily. Take 1 tablet (100 mg) by mouth daily at 8am and 4pm, take 4 tablets (400 mg) at bedtime 08/28/15   Ripudeep Krystal Eaton, MD  dicyclomine (BENTYL) 20 MG tablet Take 20 mg by mouth 2 (two) times daily as needed for spasms.    Historical Provider, MD  donepezil (ARICEPT) 5 MG tablet Take 5 mg by mouth at bedtime.    Historical Provider, MD  doxycycline (VIBRA-TABS) 100 MG tablet Take 1 tablet (100 mg total) by mouth 2 (two) times daily. X  2 weeks 09/09/15   Ivan Anchors Love, PA-C  gabapentin (NEURONTIN) 300 MG capsule Take 300 mg by mouth 3 (three) times daily.    Historical Provider, MD  guaifenesin (ROBITUSSIN) 100 MG/5ML syrup Take 200 mg by mouth 4 (four) times daily as needed for cough.    Historical Provider, MD  haloperidol (HALDOL) 5 MG tablet Take 1 tablet (5 mg total) by mouth at bedtime. 08/28/15   Ripudeep Krystal Eaton, MD  hydrocerin (EUCERIN) CREA Apply 604 application topically 2 (two) times daily. 09/09/15   Bary Leriche, PA-C  ibuprofen (ADVIL,MOTRIN) 200 MG tablet Take 400 mg by mouth 2 (two) times daily as needed (pain).     Historical Provider, MD  ipratropium-albuterol (DUONEB) 0.5-2.5 (3) MG/3ML SOLN Take 3 mLs by nebulization 3 (three) times daily. 02/19/15   Theodis Blaze, MD    lactulose (CHRONULAC) 10 GM/15ML solution Take 30 g by mouth 2 (two) times daily.    Historical Provider, MD  metFORMIN (GLUCOPHAGE) 500 MG tablet Take 500 mg by mouth 2 (two) times daily with a meal.    Historical Provider, MD  metoprolol tartrate (LOPRESSOR) 25 MG tablet Take 25 mg by mouth 2 (two) times daily.    Historical Provider, MD  Multiple Vitamin (MULITIVITAMIN WITH MINERALS) TABS Take 1 tablet by mouth daily.    Historical Provider, MD  mupirocin ointment (BACTROBAN) 2 % Apply topically daily. 09/09/15   Bary Leriche, PA-C  nicotine (NICODERM CQ - DOSED IN MG/24 HOURS) 21 mg/24hr patch Place 1 patch (21 mg total) onto the skin daily. 09/09/15   Bary Leriche, PA-C  pantoprazole (PROTONIX) 40 MG tablet Take 1 tablet (40 mg total)  by mouth daily. 02/20/15   Theodis Blaze, MD  polyethylene glycol (MIRALAX / Floria Raveling) packet Take 17 g by mouth daily as needed (constipation). Mix in 8 oz liquid and drink    Historical Provider, MD  senna-docusate (SENOKOT-S) 8.6-50 MG tablet Take 2 tablets by mouth at bedtime. 09/09/15   Ivan Anchors Love, PA-C  simvastatin (ZOCOR) 5 MG tablet Take 5 mg by mouth at bedtime.    Historical Provider, MD  traMADol (ULTRAM) 50 MG tablet Take 1 tablet (50 mg total) by mouth every 6 (six) hours as needed for severe pain (or  unrelieved by tylenol). 09/09/15   Bary Leriche, PA-C  trihexyphenidyl (ARTANE) 5 MG tablet Take 1 tablet (5 mg total) by mouth 2 (two) times daily with a meal. 08/28/15   Ripudeep Krystal Eaton, MD    Family History Family History  Problem Relation Age of Onset  . Mental illness Mother     Social History Social History  Substance Use Topics  . Smoking status: Current Every Day Smoker    Packs/day: 1.50    Years: 50.00    Types: Cigarettes  . Smokeless tobacco: Never Used  . Alcohol use No     Allergies   Review of patient's allergies indicates no known allergies.   Review of Systems Review of Systems  Skin: Positive for wound.      Physical Exam Updated Vital Signs BP 153/80 (BP Location: Left Arm)   Pulse 87   Temp 99 F (37.2 C) (Oral)   Resp 16   Ht '5\' 7"'$  (1.702 m)   Wt 77.1 kg   SpO2 100%   BMI 26.63 kg/m   Physical Exam  Constitutional: He is oriented to person, place, and time. He appears well-developed and well-nourished.  HENT:  Head: Normocephalic.  Eyes: EOM are normal.  Neck: Normal range of motion.  Pulmonary/Chest: Effort normal.  Abdominal: He exhibits no distension.  Musculoskeletal: Normal range of motion.  Lateral aspect of wound approx 1 cm superfical opening approx 3 cm from end of suture line of right stump.  Neurological: He is alert and oriented to person, place, and time.  Psychiatric: He has a normal mood and affect.  Nursing note and vitals reviewed.    ED Treatments / Results  DIAGNOSTIC STUDIES: Oxygen Saturation is 100% on RA, normal by my interpretation.  COORDINATION OF CARE:  10:02 PM Discussed treatment plan with pt at bedside and pt agreed to plan.  Labs (all labs ordered are listed, but only abnormal results are displayed) Labs Reviewed - No data to display  EKG  EKG Interpretation None       Radiology No results found.  Procedures Procedures (including critical care time)  Medications Ordered in ED Medications - No data to display   Initial Impression / Assessment and Plan / ED Course  I have reviewed the triage vital signs and the nursing notes.  Pertinent labs & imaging results that were available during my care of the patient were reviewed by me and considered in my medical decision making (see chart for details).  Clinical Course    superficial dehiscence of small portion of surgical wound  Has been cleaned and dressed   Final Clinical Impressions(s) / ED Diagnoses   Final diagnoses:  Dehiscence of amputation stump ([facility])  Fall, initial encounter   I personally performed the services described in this documentation, which was  scribed in my presence. The recorded information has been reviewed and is accurate.  New Prescriptions New Prescriptions   No medications on file     [name], NP 09/21/15 [place], MD 09/22/15 [phone]

## 2015-09-21 NOTE — Discharge Instructions (Signed)
You have a small area of your surgical wound that has opened after the fall watch it carefully  wash with soap and water daily and apply a small amount of antibiotic ointment daily  keep covered until healed.  Make appointment with PCP as needed

## 2015-09-21 NOTE — ED Notes (Addendum)
Pt's left knee has a small laceration on the top part of the knee.  No bleeding at this time.  Wound flushed with NaCl.  Pt denies any pain.

## 2015-09-21 NOTE — ED Notes (Signed)
Pt continues to be resting in bed in no acute distress.  Pt responsive to question and states he is not in pain unless his knee is handled.  Report called to nursing facility. Pt is waiting for transport to arrive.

## 2015-09-22 NOTE — ED Notes (Signed)
Pt left via [facility] back to [facility].

## 2015-09-26 ENCOUNTER — Encounter: Payer: Self-pay | Admitting: Physical Medicine & Rehabilitation

## 2015-09-26 ENCOUNTER — Encounter: Payer: Medicaid Other | Attending: Physical Medicine & Rehabilitation | Admitting: Physical Medicine & Rehabilitation

## 2015-09-26 VITALS — BP 106/79 | HR 70

## 2015-09-26 DIAGNOSIS — Z5189 Encounter for other specified aftercare: Secondary | ICD-10-CM | POA: Insufficient documentation

## 2015-09-26 DIAGNOSIS — T8130XA Disruption of wound, unspecified, initial encounter: Secondary | ICD-10-CM | POA: Diagnosis not present

## 2015-09-26 DIAGNOSIS — J449 Chronic obstructive pulmonary disease, unspecified: Secondary | ICD-10-CM | POA: Insufficient documentation

## 2015-09-26 DIAGNOSIS — E1152 Type 2 diabetes mellitus with diabetic peripheral angiopathy with gangrene: Secondary | ICD-10-CM | POA: Diagnosis not present

## 2015-09-26 DIAGNOSIS — F209 Schizophrenia, unspecified: Secondary | ICD-10-CM | POA: Diagnosis not present

## 2015-09-26 DIAGNOSIS — R269 Unspecified abnormalities of gait and mobility: Secondary | ICD-10-CM | POA: Diagnosis not present

## 2015-09-26 DIAGNOSIS — Z89511 Acquired absence of right leg below knee: Secondary | ICD-10-CM | POA: Diagnosis not present

## 2015-09-26 DIAGNOSIS — Z716 Tobacco abuse counseling: Secondary | ICD-10-CM

## 2015-09-26 DIAGNOSIS — Z720 Tobacco use: Secondary | ICD-10-CM

## 2015-09-26 NOTE — Progress Notes (Addendum)
Subjective:    Patient ID: Craig Bentley, male    DOB: 01-Jul-1955, 60 y.o.   MRN: [id number]  HPI  60 y.o. male with history of T2DM, COPD, schizophrenia, tobacco use, PAD s/p right aorto-bifemoral by pass and right femoral to posterior tibial, left femoral to SFA bypass with right 5th toe amputation 04/25/2015 with dehiscence of wound and gangrenous changes presents for hospital follow up for right BKA.   Since discharge, he has seen Dr. Sharol Given.  He has prescription for prosthetic.  He denies fevers.  Denies pain.  His drainage has resolved.  He is washing his stump.  He is smoking 15 cig/day.  He saw PCP.  Overall, he is doing well.   Pain Inventory Average Pain 2 Pain Right Now 2 My pain is dull  In the last 24 hours, has pain interfered with the following? General activity 0 Relation with others 0 Enjoyment of life 0 What TIME of day is your pain at its worst? daytime Sleep (in general) Good  Pain is worse with: unsure Pain improves with: rest Relief from Meds: 10  Mobility ability to climb steps?  no do you drive?  no use a wheelchair  Function not employed: date last employed . I need assistance with the following:  bathing Do you have any goals in this area?  no  Neuro/Psych trouble walking anxiety  Prior Studies hospital f/u  Physicians involved in your care hospital f/u   Family History  Problem Relation Age of Onset  . Mental illness Mother    Social History   Social History  . Marital status: Single    Spouse name: N/A  . Number of children: N/A  . Years of education: N/A   Social History Main Topics  . Smoking status: Current Every Day Smoker    Packs/day: 1.50    Years: 50.00    Types: Cigarettes  . Smokeless tobacco: Never Used  . Alcohol use No  . Drug use: No  . Sexual activity: No   Other Topics Concern  . Not on file   Social History Narrative  . No narrative on file   Past Surgical History:  Procedure Laterality Date  .  AMPUTATION Right 04/23/2015   Procedure: Right Fifth Toe Amputation;  Surgeon: Conrad [place], MD;  Location: [facility];  Service: Vascular;  Laterality: Right;  . AMPUTATION Right 08/27/2015   Procedure: RIGHT BELOW KNEE AMPUTATION ;  Surgeon: Newt Minion, MD;  Location: [place];  Service: Orthopedics;  Laterality: Right;  . AORTA - BILATERAL FEMORAL ARTERY BYPASS GRAFT Bilateral 04/23/2015   Procedure: AORTOBIFEMORAL BYPASS GRAFT;  Surgeon: Conrad [place], MD;  Location: [place];  Service: Vascular;  Laterality: Bilateral;  . FEMORAL-FEMORAL BYPASS GRAFT Left 04/23/2015   Procedure:  Left Common Femoral Artery to Superficial Femoral Artery Bypass Graft with Vein;  Surgeon: Conrad [place], MD;  Location: [place];  Service: Vascular;  Laterality: Left;  . FEMORAL-TIBIAL BYPASS GRAFT Right 04/23/2015   Procedure: Right Common Femoral to Posterior Tibial Bypass with Composite Vein Graft. ;  Surgeon: Conrad , MD;  Location: [place];  Service: Vascular;  Laterality: Right;  . PERIPHERAL VASCULAR CATHETERIZATION N/A 04/07/2015   Procedure: Abdominal Aortogram;  Surgeon: Angelia Mould, MD;  Location: [place] [facility];  Service: Cardiovascular;  Laterality: N/A;   Past Medical History:  Diagnosis Date  . Anxiety   . Constipation   . COPD (chronic obstructive pulmonary disease) ([place])   .  Diabetes mellitus   . Fecal impaction ([place])   . Hypertension   . Pneumonia   . Schizophrenia, schizo-affective ([place])    BP 106/79 (BP Location: Left Arm, Patient Position: Sitting, Cuff Size: Large)   Pulse 70   SpO2 95%   Opioid Risk Score:   Fall Risk Score:  `1  Depression screen PHQ 2/9  No flowsheet data found.  Review of Systems  Musculoskeletal: Positive for gait problem.  Psychiatric/Behavioral: The patient is nervous/anxious.   All other systems reviewed and are negative.     Objective:   Physical Exam Constitutional: He appears well-developed and well-nourished.  HENT: Normocephalic and atraumatic.   Eyes: Conjunctivae and EOM are normal. No scleral icterus.  Cardiovascular: Normal rate and regular rhythm.  Respiratory: Bilateral clear to ausculation. Effort normal. No stridor. He exhibits no tenderness.  GI: Soft. Bowel sounds are normal. He exhibits no distension. There is no tenderness.  Musculoskeletal: He exhibits no edema. R-BKA.  Neurological: He is alert and oriented x3.  Able to follow basic commands without difficulty.  Motor:5/5 throughout B/l and LLE: RLE 5/5 HF Skin: Skin is warm and dry. Incision with dried drainage, healing.  Psychiatric: He has a normal mood and affect. His behavior is normal.     Assessment & Plan:  60 y.o. male with history of T2DM, COPD, schizophrenia, tobacco use, PAD s/p right aorto-bifemoral by pass and right femoral to posterior tibial, left femoral to SFA bypass with right 5th toe amputation 04/25/2015 with dehiscence of wound and gangrenous changes presents for hospital follow up for right BKA.    1. S/p right BKA on 7/5  Therapies to start after prosthesis  Cont local wound care  Cont follow up with Dr. Marylene Land on skin checks with prosthesis  2. Abnormality of gait  Cont wheelchair for safety  Therapies after prosthesis  3. Tobacco abuse  Counseled on smoking cessation, currently smokes 15/day

## 2015-12-12 ENCOUNTER — Ambulatory Visit (INDEPENDENT_AMBULATORY_CARE_PROVIDER_SITE_OTHER): Payer: Self-pay | Admitting: Orthopedic Surgery

## 2015-12-15 ENCOUNTER — Encounter (INDEPENDENT_AMBULATORY_CARE_PROVIDER_SITE_OTHER): Payer: Self-pay | Admitting: Orthopedic Surgery

## 2015-12-15 ENCOUNTER — Ambulatory Visit (INDEPENDENT_AMBULATORY_CARE_PROVIDER_SITE_OTHER): Payer: Medicaid Other | Admitting: Orthopedic Surgery

## 2015-12-15 DIAGNOSIS — Z89511 Acquired absence of right leg below knee: Secondary | ICD-10-CM | POA: Diagnosis not present

## 2015-12-15 NOTE — Progress Notes (Deleted)
Office Visit Note   Patient: Craig Bentley           Date of Birth: 1955-04-01           MRN: [id number] Visit Date: 12/15/2015              Requested by: Mabeline Caras, NP [place], [place] [address] PCP: Mabeline Caras, NP   Assessment & Plan: Visit Diagnoses: No diagnosis found.  Plan: ***  Follow-Up Instructions: No Follow-up on file.   Orders:  No orders of the defined types were placed in this encounter.  No orders of the defined types were placed in this encounter.     Procedures: No procedures performed   Clinical Data: No additional findings.   Subjective: Chief Complaint  Patient presents with  . Right Knee - Routine Post Op  . Follow-up    Right transtibial amputation on 08/27/15    HPI  Review of Systems   Objective: Vital Signs: There were no vitals taken for this visit.  Physical Exam  Ortho Exam  Specialty Comments:  No specialty comments available.  Imaging: No results found.   PMFS History: Patient Active Problem List   Diagnosis Date Noted  . Low grade fever   . Surgical wound dehiscence   . Opacity of lung on imaging study   . Acute blood loss anemia   . Fever   . SIRS (systemic inflammatory response syndrome) (HCC)   . Leukocytosis   . Status post below knee amputation of right lower extremity ([place]) 08/28/2015  . 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[place]) 08/23/2015  . Sepsis ([place]) 08/21/2015  . ARF (acute renal failure) ([place]) 08/21/2015  . Acute encephalopathy 08/21/2015  . Post-operative state   . Respiratory failure ([place])   . Surgery, elective   . Anxiety   . Schizophrenia ([place])   . Atherosclerosis of extremity with gangrene ([place])   . Hyperglycemia   . Preoperative cardiovascular examination 04/11/2015  . PVD (peripheral vascular disease) ([place]) 04/10/2015  . Absolute anemia   . Diabetic foot infection ([place]) 04/02/2015  . Right foot pain 04/02/2015  . Neutrophilic leukocytosis 70/17/7939  . Esophageal  abnormality   . HCAP (healthcare-associated pneumonia)   . COPD exacerbation ([place])   . Dyspnea   . Respiratory distress   . Sepsis due to pneumonia ([place]) 02/14/2015  . Acute respiratory failure with hypoxia ([place]) 02/13/2015  . Diabetes mellitus with neurologic complication, without long-term current use of insulin ([place]) 07/20/2011  . Essential hypertension   . COPD (chronic obstructive pulmonary disease) (HCC)    Past Medical History:  Diagnosis Date  . Anxiety   . Constipation   . COPD (chronic obstructive pulmonary disease) ([place])   . Diabetes mellitus   . Fecal impaction ([place])   . Hypertension   . Pneumonia   . Schizophrenia, schizo-affective ([place])     Family History  Problem Relation Age of Onset  . Mental illness Mother     Past Surgical History:  Procedure Laterality Date  . AMPUTATION Right 04/23/2015   Procedure: Right Fifth Toe Amputation;  Surgeon: Conrad [place], MD;  Location: [facility];  Service: Vascular;  Laterality: Right;  . AMPUTATION Right 08/27/2015   Procedure: RIGHT BELOW KNEE AMPUTATION ;  Surgeon: Newt Minion, MD;  Location: [place];  Service: Orthopedics;  Laterality: Right;  . AORTA - BILATERAL FEMORAL ARTERY BYPASS GRAFT Bilateral 04/23/2015   Procedure: AORTOBIFEMORAL BYPASS GRAFT;  Surgeon: Conrad [place], MD;  Location: [place];  Service: Vascular;  Laterality: Bilateral;  . FEMORAL-FEMORAL BYPASS GRAFT Left 04/23/2015   Procedure:  Left Common Femoral Artery to Superficial Femoral Artery Bypass Graft with Vein;  Surgeon: Conrad [place], MD;  Location: [place];  Service: Vascular;  Laterality: Left;  . FEMORAL-TIBIAL BYPASS GRAFT Right 04/23/2015   Procedure: Right Common Femoral to Posterior Tibial Bypass with Composite Vein Graft. ;  Surgeon: Conrad [place], MD;  Location: [place];  Service: Vascular;  Laterality: Right;  . PERIPHERAL VASCULAR CATHETERIZATION N/A 04/07/2015   Procedure: Abdominal Aortogram;  Surgeon: Angelia Mould, MD;  Location: [place] [facility];   Service: Cardiovascular;  Laterality: N/A;   Social History   Occupational History  . Not on file.   Social History Main Topics  . Smoking status: Current Every Day Smoker    Packs/day: 1.50    Years: 50.00    Types: Cigarettes  . Smokeless tobacco: Never Used  . Alcohol use No  . Drug use: No  . Sexual activity: No

## 2015-12-15 NOTE — Progress Notes (Signed)
Wound Care Note   Patient: Craig Bentley           Date of Birth: 1955-04-03           MRN: [id number]             PCP: Mabeline Caras, NP Visit Date: 12/15/2015   Assessment & Plan: Visit Diagnoses:  1. Acquired absence of right leg below knee (HCC)     Plan: follow-up in 2 months.   Follow-Up Instructions: Return in about 2 months (around 02/14/2016).  Orders:  No orders of the defined types were placed in this encounter.  No orders of the defined types were placed in this encounter.     Procedures: No notes on file   Clinical Data: No additional findings.   No images are attached to the encounter.   Subjective: Chief Complaint  Patient presents with  . Right Knee - Routine Post Op  . Follow-up    Right transtibial amputation on 08/27/15    Patient is here today for 4 week follow up right transtibial amputation he had on 08/27/15. Patient resides at [facility], he comes today not accompanied by anyone else. He is wearing a stump shrinker today without a dressing. He had previous lateral wound on residual limb with 100% eschar.  There is no drainage. He states he has been fitted for his prosthetic leg from [place] and will obtain this on Thursday and they will set him up for gait training at that time. Patient denies any pain today and overall is doing well.    Review of Systems  Miscellaneous:  -Home Health Care: no at a SNF  -Physical Therapy: pending prosthetic leg fabrication  -Out of Work?:na  -Worker's Compensation Case?: no    Objective: Vital Signs: There were no vitals taken for this visit.  Physical Exam: on examination patient is alert oriented no adenopathy well-dressed normal affect normal respiratory effort he is ambulating in a wheelchair. Examination of the right transtibial amputation the wound is completely healed there is no cellulitis no drainage no signs of infection.  Specialty Comments: No specialty comments available.   PMFS  History: Patient Active Problem List   Diagnosis Date Noted  . Low grade fever   . Surgical wound dehiscence   . Opacity of lung on imaging study   . Acute blood loss anemia   . Fever   . SIRS (systemic inflammatory response syndrome) (HCC)   . Leukocytosis   . Status post below knee amputation of right lower extremity ([place]) 08/28/2015  . 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[place]) 08/23/2015  . Sepsis ([place]) 08/21/2015  . ARF (acute renal failure) ([place]) 08/21/2015  . Acute encephalopathy 08/21/2015  . Post-operative state   . Respiratory failure ([place])   . Surgery, elective   . Anxiety   . Schizophrenia ([place])   . Atherosclerosis of extremity with gangrene ([place])   . Hyperglycemia   . Preoperative cardiovascular examination 04/11/2015  . PVD (peripheral vascular disease) ([place]) 04/10/2015  . Absolute anemia   . Diabetic foot infection ([place]) 04/02/2015  . Right foot pain 04/02/2015  . Neutrophilic leukocytosis 95/10/3265  . Esophageal abnormality   . HCAP (healthcare-associated pneumonia)   . COPD exacerbation ([place])   . Dyspnea   . Respiratory distress   . Sepsis due to pneumonia ([place]) 02/14/2015  . Acute respiratory failure with hypoxia ([place]) 02/13/2015  . Diabetes mellitus with neurologic complication, without long-term current use of insulin ([place]) 07/20/2011  . Essential hypertension   .  COPD (chronic obstructive pulmonary disease) (HCC)    Past Medical History:  Diagnosis Date  . Anxiety   . Constipation   . COPD (chronic obstructive pulmonary disease) ([place])   . Diabetes mellitus   . Fecal impaction ([place])   . Hypertension   . Pneumonia   . Schizophrenia, schizo-affective ([place])     Family History  Problem Relation Age of Onset  . Mental illness Mother    Past Surgical History:  Procedure Laterality Date  . AMPUTATION Right 04/23/2015   Procedure: Right Fifth Toe Amputation;  Surgeon: Conrad Ravena, MD;  Location: [place];  Service: Vascular;  Laterality: Right;  . AMPUTATION Right  08/27/2015   Procedure: RIGHT BELOW KNEE AMPUTATION ;  Surgeon: Newt Minion, MD;  Location: [place];  Service: Orthopedics;  Laterality: Right;  . AORTA - BILATERAL FEMORAL ARTERY BYPASS GRAFT Bilateral 04/23/2015   Procedure: AORTOBIFEMORAL BYPASS GRAFT;  Surgeon: Conrad De Soto, MD;  Location: [place];  Service: Vascular;  Laterality: Bilateral;  . FEMORAL-FEMORAL BYPASS GRAFT Left 04/23/2015   Procedure:  Left Common Femoral Artery to Superficial Femoral Artery Bypass Graft with Vein;  Surgeon: Conrad P[place] North, MD;  Location: [place];  Service: Vascular;  Laterality: Left;  . FEMORAL-TIBIAL BYPASS GRAFT Right 04/23/2015   Procedure: Right Common Femoral to Posterior Tibial Bypass with Composite Vein Graft. ;  Surgeon: Conrad [place], MD;  Location: [place];  Service: Vascular;  Laterality: Right;  . PERIPHERAL VASCULAR CATHETERIZATION N/A 04/07/2015   Procedure: Abdominal Aortogram;  Surgeon: Angelia Mould, MD;  Location: [place] [facility];  Service: Cardiovascular;  Laterality: N/A;   Social History   Occupational History  . Not on file.   Social History Main Topics  . Smoking status: Current Every Day Smoker    Packs/day: 1.50    Years: 50.00    Types: Cigarettes  . Smokeless tobacco: Never Used  . Alcohol use No  . Drug use: No  . Sexual activity: No

## 2015-12-31 ENCOUNTER — Encounter: Payer: Medicaid Other | Admitting: Registered Nurse

## 2016-01-01 ENCOUNTER — Encounter: Payer: Medicaid Other | Attending: Physical Medicine & Rehabilitation | Admitting: Registered Nurse

## 2016-01-01 ENCOUNTER — Encounter: Payer: Self-pay | Admitting: Registered Nurse

## 2016-01-01 VITALS — BP 151/71 | HR 69 | Resp 14

## 2016-01-01 DIAGNOSIS — T8130XA Disruption of wound, unspecified, initial encounter: Secondary | ICD-10-CM | POA: Diagnosis not present

## 2016-01-01 DIAGNOSIS — E1152 Type 2 diabetes mellitus with diabetic peripheral angiopathy with gangrene: Secondary | ICD-10-CM | POA: Insufficient documentation

## 2016-01-01 DIAGNOSIS — Z5189 Encounter for other specified aftercare: Secondary | ICD-10-CM | POA: Diagnosis present

## 2016-01-01 DIAGNOSIS — R269 Unspecified abnormalities of gait and mobility: Secondary | ICD-10-CM

## 2016-01-01 DIAGNOSIS — X58XXXA Exposure to other specified factors, initial encounter: Secondary | ICD-10-CM | POA: Diagnosis not present

## 2016-01-01 DIAGNOSIS — J449 Chronic obstructive pulmonary disease, unspecified: Secondary | ICD-10-CM | POA: Diagnosis not present

## 2016-01-01 DIAGNOSIS — Z720 Tobacco use: Secondary | ICD-10-CM

## 2016-01-01 DIAGNOSIS — F209 Schizophrenia, unspecified: Secondary | ICD-10-CM | POA: Insufficient documentation

## 2016-01-01 DIAGNOSIS — Z89511 Acquired absence of right leg below knee: Secondary | ICD-10-CM

## 2016-01-01 NOTE — Progress Notes (Signed)
Subjective:    Patient ID: [name], male    DOB: [birth year], 60 y.o.   MRN: [id number]  HPI: Mr, Craig Bentley is a 60 year old male who returns for follow up appointment for right BKA. States he has received his prosthetic leg and hasn't started his prosthetic training with therapy, he has been staying at [facility] care and he will be moving to [facility] in a few weeks. After the  move his prosthetic training will be initiated he states.  He rates his pain 0.   Educated on smoking cessation he states at times he smokes 11/2 pack a day. He arrived in wheelchair.  Pain Inventory Average Pain 0 Pain Right Now 0 My pain is no pain  In the last 24 hours, has pain interfered with the following? General activity 0 Relation with others 0 Enjoyment of life 0 What TIME of day is your pain at its worst? no pain Sleep (in general) Good  Pain is worse with: no pain Pain improves with: no pain Relief from Meds: no pain  Mobility ability to climb steps?  no do you drive?  no use a wheelchair transfers alone  Function Do you have any goals in this area?  yes  Neuro/Psych No problems in this area  Prior Studies Any changes since last visit?  no  Physicians involved in your care Any changes since last visit?  no   Family History  Problem Relation Age of Onset  . Mental illness Mother    Social History   Social History  . Marital status: Single    Spouse name: N/A  . Number of children: N/A  . Years of education: N/A   Social History Main Topics  . Smoking status: Current Every Day Smoker    Packs/day: 1.50    Years: 50.00    Types: Cigarettes  . Smokeless tobacco: Never Used  . Alcohol use No  . Drug use: No  . Sexual activity: No   Other Topics Concern  . None   Social History Narrative  . None   Past Surgical History:  Procedure Laterality Date  . AMPUTATION Right 04/23/2015   Procedure: Right Fifth Toe Amputation;  Surgeon: Conrad Allport, MD;  Location: [place];  Service: Vascular;  Laterality: Right;  . AMPUTATION Right 08/27/2015   Procedure: RIGHT BELOW KNEE AMPUTATION ;  Surgeon: Newt Minion, MD;  Location: [place];  Service: Orthopedics;  Laterality: Right;  . AORTA - BILATERAL FEMORAL ARTERY BYPASS GRAFT Bilateral 04/23/2015   Procedure: AORTOBIFEMORAL BYPASS GRAFT;  Surgeon: Conrad Allenwood, MD;  Location: [place];  Service: Vascular;  Laterality: Bilateral;  . FEMORAL-FEMORAL BYPASS GRAFT Left 04/23/2015   Procedure:  Left Common Femoral Artery to Superficial Femoral Artery Bypass Graft with Vein;  Surgeon: Conrad Fernando Salinas, MD;  Location: [place];  Service: Vascular;  Laterality: Left;  . FEMORAL-TIBIAL BYPASS GRAFT Right 04/23/2015   Procedure: Right Common Femoral to Posterior Tibial Bypass with Composite Vein Graft. ;  Surgeon: Conrad McIntosh, MD;  Location: [place];  Service: Vascular;  Laterality: Right;  . PERIPHERAL VASCULAR CATHETERIZATION N/A 04/07/2015   Procedure: Abdominal Aortogram;  Surgeon: Angelia Mould, MD;  Location: [place] [facility];  Service: Cardiovascular;  Laterality: N/A;   Past Medical History:  Diagnosis Date  . Anxiety   . Constipation   . COPD (chronic obstructive pulmonary disease) ([place])   . Diabetes mellitus   . Fecal impaction ([place])   .  Hypertension   . Pneumonia   . Schizophrenia, schizo-affective ([place])    BP (!) 151/71 (BP Location: Left Arm, Patient Position: Sitting, Cuff Size: Large)   Pulse 69   Resp 14   SpO2 95%   Opioid Risk Score:   Fall Risk Score:  `1  Depression screen PHQ 2/9  No flowsheet data found.  Review of Systems  Constitutional: Negative.   HENT: Negative.   Eyes: Negative.   Respiratory: Negative.   Cardiovascular: Negative.   Gastrointestinal: Negative.   Endocrine: Negative.   Genitourinary: Negative.   Musculoskeletal: Negative.   Skin: Negative.   Allergic/Immunologic: Negative.   Neurological: Negative.   Hematological: Negative.   Psychiatric/Behavioral: Negative.         Objective:   Physical Exam  Constitutional: He is oriented to person, place, and time. He appears well-developed and well-nourished.  HENT:  Head: Normocephalic and atraumatic.  Neck: Normal range of motion. Neck supple.  Cardiovascular: Normal rate and regular rhythm.   Pulmonary/Chest: Effort normal and breath sounds normal.  Musculoskeletal:  Normal Muscle Bulk and Muscle Testing Reveals: Upper Extremities: Full ROM and Muscle Strength 5/5 Lower Extremities: Right: BKA/ wearing shrinker Left: Full ROM and Muscle Strength 5/5 Arrived in wheelchair  Neurological: He is alert and oriented to person, place, and time.  Skin: Skin is warm and dry.  Psychiatric: He has a normal mood and affect.  Nursing note and vitals reviewed.         Assessment & Plan:  1. S/p right BKA on 7/5: Dr. Sharol Given Following Therapy will start when he is moved to [facility]. He has his Prosthetic Leg, he states. 2. Abnormality of gait: Prosthetic Training with Physical Therapy Pending.    Continue wheelchair for safety at this time.  3. Tobacco abuse: Educated on Smoking Cessation              20 minutes of face to face patient care time was spent during this visit. All questions were encouraged and answered.    F/U in 3 months with Dr. Posey Pronto

## 2016-02-19 ENCOUNTER — Ambulatory Visit (INDEPENDENT_AMBULATORY_CARE_PROVIDER_SITE_OTHER): Payer: Medicaid Other | Admitting: Orthopedic Surgery

## 2016-03-24 ENCOUNTER — Emergency Department (HOSPITAL_COMMUNITY): Payer: Medicaid Other

## 2016-03-24 ENCOUNTER — Emergency Department (HOSPITAL_COMMUNITY)
Admission: EM | Admit: 2016-03-24 | Discharge: 2016-03-24 | Disposition: A | Discharge status: Home / Self Care | Payer: Medicaid Other | Attending: Emergency Medicine | Admitting: Emergency Medicine

## 2016-03-24 DIAGNOSIS — T43505A Adverse effect of unspecified antipsychotics and neuroleptics, initial encounter: Secondary | ICD-10-CM | POA: Insufficient documentation

## 2016-03-24 DIAGNOSIS — Z794 Long term (current) use of insulin: Secondary | ICD-10-CM | POA: Insufficient documentation

## 2016-03-24 DIAGNOSIS — Y829 Unspecified medical devices associated with adverse incidents: Secondary | ICD-10-CM | POA: Insufficient documentation

## 2016-03-24 DIAGNOSIS — T887XXA Unspecified adverse effect of drug or medicament, initial encounter: Secondary | ICD-10-CM | POA: Diagnosis not present

## 2016-03-24 DIAGNOSIS — J449 Chronic obstructive pulmonary disease, unspecified: Secondary | ICD-10-CM | POA: Insufficient documentation

## 2016-03-24 DIAGNOSIS — G2401 Drug induced subacute dyskinesia: Secondary | ICD-10-CM | POA: Diagnosis not present

## 2016-03-24 DIAGNOSIS — Z79899 Other long term (current) drug therapy: Secondary | ICD-10-CM | POA: Insufficient documentation

## 2016-03-24 DIAGNOSIS — E119 Type 2 diabetes mellitus without complications: Secondary | ICD-10-CM | POA: Insufficient documentation

## 2016-03-24 DIAGNOSIS — I10 Essential (primary) hypertension: Secondary | ICD-10-CM | POA: Insufficient documentation

## 2016-03-24 DIAGNOSIS — F17210 Nicotine dependence, cigarettes, uncomplicated: Secondary | ICD-10-CM | POA: Diagnosis not present

## 2016-03-24 DIAGNOSIS — Z7982 Long term (current) use of aspirin: Secondary | ICD-10-CM | POA: Diagnosis not present

## 2016-03-24 DIAGNOSIS — R531 Weakness: Secondary | ICD-10-CM | POA: Diagnosis present

## 2016-03-24 LAB — CBC WITH DIFFERENTIAL/PLATELET
Basophils Absolute: 0 10*3/uL (ref 0.0–0.1)
Basophils Relative: 0 %
Eosinophils Absolute: 0 10*3/uL (ref 0.0–0.7)
Eosinophils Relative: 0 %
HCT: 37.4 % — ABNORMAL LOW (ref 39.0–52.0)
Hemoglobin: 11.9 g/dL — ABNORMAL LOW (ref 13.0–17.0)
Lymphocytes Relative: 30 %
Lymphs Abs: 2.6 10*3/uL (ref 0.7–4.0)
MCH: 27.4 pg (ref 26.0–34.0)
MCHC: 31.8 g/dL (ref 30.0–36.0)
MCV: 86.2 fL (ref 78.0–100.0)
Monocytes Absolute: 0.5 10*3/uL (ref 0.1–1.0)
Monocytes Relative: 6 %
Neutro Abs: 5.6 10*3/uL (ref 1.7–7.7)
Neutrophils Relative %: 64 %
Platelets: 217 10*3/uL (ref 150–400)
RBC: 4.34 MIL/uL (ref 4.22–5.81)
RDW: 13.3 % (ref 11.5–15.5)
WBC: 8.7 10*3/uL (ref 4.0–10.5)

## 2016-03-24 LAB — URINALYSIS, ROUTINE W REFLEX MICROSCOPIC
Bilirubin Urine: NEGATIVE
Glucose, UA: NEGATIVE mg/dL
Hgb urine dipstick: NEGATIVE
Ketones, ur: NEGATIVE mg/dL
Leukocytes, UA: NEGATIVE
Nitrite: NEGATIVE
Protein, ur: NEGATIVE mg/dL
Specific Gravity, Urine: 1.024 (ref 1.005–1.030)
pH: 5 (ref 5.0–8.0)

## 2016-03-24 LAB — COMPREHENSIVE METABOLIC PANEL
ALBUMIN: 3.7 g/dL (ref 3.5–5.0)
ALT: 17 U/L (ref 17–63)
ANION GAP: 9 (ref 5–15)
AST: 20 U/L (ref 15–41)
Alkaline Phosphatase: 60 U/L (ref 38–126)
BILIRUBIN TOTAL: 0.5 mg/dL (ref 0.3–1.2)
BUN: 15 mg/dL (ref 6–20)
CHLORIDE: 105 mmol/L (ref 101–111)
CO2: 28 mmol/L (ref 22–32)
Calcium: 9.1 mg/dL (ref 8.9–10.3)
Creatinine, Ser: 0.85 mg/dL (ref 0.61–1.24)
GFR calc Af Amer: >60 mL/min (ref >60)
GLUCOSE: 130 mg/dL — AB (ref 65–99)
POTASSIUM: 3.2 mmol/L — AB (ref 3.5–5.1)
Sodium: 142 mmol/L (ref 135–145)
TOTAL PROTEIN: 7.1 g/dL (ref 6.5–8.1)

## 2016-03-24 NOTE — ED Notes (Signed)
Bed: WA02 Expected date:  Expected time:  Means of arrival:  Comments: EMS seizure

## 2016-03-24 NOTE — Discharge Instructions (Signed)
His abnormal mouth movements and changes is related to his anti-psychotic medications. He may need to dicontinue his haldol. The rest of his work-up and labs today are reassuring. Please have him see the psychiatrist regarding medication changes and management.

## 2016-03-24 NOTE — ED Notes (Signed)
Bed: [facility] Expected date:  Expected time:  Means of arrival:  Comments: 

## 2016-03-24 NOTE — ED Notes (Signed)
[facility] has been notified of transport
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
## 2016-03-24 NOTE — ED Triage Notes (Signed)
Pt reports "I feel like I'm dying." Pt asked why does he feel like he is dying, pt replies "Because I'm tired." Pt denies all pain at this time. Pt denies N/V/D also at this time.

## 2016-03-28 ENCOUNTER — Emergency Department (HOSPITAL_COMMUNITY): Payer: Medicaid Other

## 2016-03-28 ENCOUNTER — Encounter (HOSPITAL_COMMUNITY): Payer: Self-pay | Admitting: Nurse Practitioner

## 2016-03-28 ENCOUNTER — Emergency Department (HOSPITAL_COMMUNITY)
Admission: EM | Admit: 2016-03-28 | Discharge: 2016-03-28 | Disposition: A | Discharge status: Home / Self Care | Payer: Medicaid Other | Attending: Emergency Medicine | Admitting: Emergency Medicine

## 2016-03-28 DIAGNOSIS — R440 Auditory hallucinations: Secondary | ICD-10-CM | POA: Insufficient documentation

## 2016-03-28 DIAGNOSIS — F17210 Nicotine dependence, cigarettes, uncomplicated: Secondary | ICD-10-CM | POA: Diagnosis not present

## 2016-03-28 DIAGNOSIS — R110 Nausea: Secondary | ICD-10-CM

## 2016-03-28 DIAGNOSIS — I10 Essential (primary) hypertension: Secondary | ICD-10-CM | POA: Insufficient documentation

## 2016-03-28 DIAGNOSIS — Z7982 Long term (current) use of aspirin: Secondary | ICD-10-CM | POA: Diagnosis not present

## 2016-03-28 DIAGNOSIS — J449 Chronic obstructive pulmonary disease, unspecified: Secondary | ICD-10-CM | POA: Diagnosis not present

## 2016-03-28 DIAGNOSIS — E119 Type 2 diabetes mellitus without complications: Secondary | ICD-10-CM | POA: Insufficient documentation

## 2016-03-28 DIAGNOSIS — Z79899 Other long term (current) drug therapy: Secondary | ICD-10-CM | POA: Diagnosis not present

## 2016-03-28 DIAGNOSIS — R441 Visual hallucinations: Secondary | ICD-10-CM | POA: Insufficient documentation

## 2016-03-28 LAB — URINALYSIS, ROUTINE W REFLEX MICROSCOPIC
BILIRUBIN URINE: NEGATIVE
Bacteria, UA: NONE SEEN
Glucose, UA: NEGATIVE mg/dL
HGB URINE DIPSTICK: NEGATIVE
Ketones, ur: 5 mg/dL — AB
LEUKOCYTES UA: NEGATIVE
NITRITE: NEGATIVE
PH: 5 (ref 5.0–8.0)
Protein, ur: NEGATIVE mg/dL
SPECIFIC GRAVITY, URINE: 1.021 (ref 1.005–1.030)

## 2016-03-28 LAB — COMPREHENSIVE METABOLIC PANEL
ALBUMIN: 4 g/dL (ref 3.5–5.0)
ALT: 17 U/L (ref 17–63)
ANION GAP: 8 (ref 5–15)
AST: 21 U/L (ref 15–41)
Alkaline Phosphatase: 62 U/L (ref 38–126)
BILIRUBIN TOTAL: 0.6 mg/dL (ref 0.3–1.2)
BUN: 12 mg/dL (ref 6–20)
CO2: 30 mmol/L (ref 22–32)
Calcium: 9.1 mg/dL (ref 8.9–10.3)
Chloride: 102 mmol/L (ref 101–111)
Creatinine, Ser: 0.75 mg/dL (ref 0.61–1.24)
GFR calc Af Amer: >60 mL/min (ref >60)
GLUCOSE: 101 mg/dL — AB (ref 65–99)
POTASSIUM: 3.3 mmol/L — AB (ref 3.5–5.1)
Sodium: 140 mmol/L (ref 135–145)
TOTAL PROTEIN: 7 g/dL (ref 6.5–8.1)

## 2016-03-28 LAB — CBC WITH DIFFERENTIAL/PLATELET
BASOS PCT: 0 %
Basophils Absolute: 0 10*3/uL (ref 0.0–0.1)
EOS PCT: 0 %
Eosinophils Absolute: 0 10*3/uL (ref 0.0–0.7)
HEMATOCRIT: 39.1 % (ref 39.0–52.0)
Hemoglobin: 12.3 g/dL — ABNORMAL LOW (ref 13.0–17.0)
Lymphocytes Relative: 33 %
Lymphs Abs: 3 10*3/uL (ref 0.7–4.0)
MCH: 26.6 pg (ref 26.0–34.0)
MCHC: 31.5 g/dL (ref 30.0–36.0)
MCV: 84.6 fL (ref 78.0–100.0)
MONO ABS: 0.5 10*3/uL (ref 0.1–1.0)
MONOS PCT: 6 %
NEUTROS ABS: 5.7 10*3/uL (ref 1.7–7.7)
Neutrophils Relative %: 61 %
PLATELETS: 226 10*3/uL (ref 150–400)
RBC: 4.62 MIL/uL (ref 4.22–5.81)
RDW: 13.1 % (ref 11.5–15.5)
WBC: 9.3 10*3/uL (ref 4.0–10.5)

## 2016-03-28 LAB — LIPASE, BLOOD: LIPASE: 25 U/L (ref 11–51)

## 2016-03-28 MED ORDER — SODIUM CHLORIDE 0.9 % IV BOLUS (SEPSIS)
1000.0000 mL | Freq: Once | INTRAVENOUS | Status: AC
  Administered 2016-03-28: 1000 mL via INTRAVENOUS

## 2016-03-28 MED ORDER — ONDANSETRON HCL 4 MG/2ML IJ SOLN
4.0000 mg | Freq: Once | INTRAMUSCULAR | Status: AC
  Administered 2016-03-28: 4 mg via INTRAVENOUS
  Filled 2016-03-28: qty 2

## 2016-03-28 MED ORDER — ONDANSETRON HCL 4 MG PO TABS: 4.0000 mg | ORAL_TABLET | Freq: Four times a day (QID) | ORAL | 0 refills | Status: DC

## 2016-03-28 NOTE — ED Notes (Signed)
PTAR present to transport pt back to home

## 2016-03-28 NOTE — ED Provider Notes (Signed)
[facility] DEPT Provider Note   CSN: [id number] Arrival date & time: 03/28/16  3710     History   Chief Complaint Chief Complaint  Patient presents with  . Hallucinations    auditory & visual  . Pain    somatic    HPI Craig Bentley is a 61 y.o. male.  HPI Patient presented to the emergency room with complaints of nausea. Patient states that his stomach did not feel well today. He felt nauseated.  He did not have an appetite because of the discomfort. He's had some generalized discomfort. Denies any vomiting or diarrhea. He denies any trouble with any chest pain or shortness of breath.  Because of not feeling well he called EMS to be brought into the emergency room to be evaluated. Patient has history of schizophrenia. He has some chronic issues with intermittent auditory and visual hallucinations. He continues to have these but this is not a new issue for him. He denies any suicidal or homicidal ideation. He denies any command hallucinations. Past Medical History:  Diagnosis Date  . Anxiety   . Constipation   . COPD (chronic obstructive pulmonary disease) ([place])   . Diabetes mellitus   . Fecal impaction ([place])   . Hypertension   . Pneumonia   . Schizophrenia, schizo-affective [facility])     Patient Active Problem List   Diagnosis Date Noted  . Low grade fever   . Surgical wound dehiscence   . Opacity of lung on imaging study   . Acute blood loss anemia   . Fever   . SIRS (systemic inflammatory response syndrome) (H[facility])   . Leukocytosis   . Status post below knee amputation of right lower extremity ([place]) 08/28/2015  . 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[place]) 08/23/2015  . Sepsis ([place]) 08/21/2015  . ARF (acute renal failure) ([place]) 08/21/2015  . Acute encephalopathy 08/21/2015  . Post-operative state   . Respiratory failure ([place])   . Surgery, elective   . Anxiety   . Schizophrenia ([place])   . Atherosclerosis of extremity with gangrene ([place])   . Hyperglycemia   . Preoperative  cardiovascular examination 04/11/2015  . PVD (peripheral vascular disease) ([place]) 04/10/2015  . Absolute anemia   . Diabetic foot infection ([place]) 04/02/2015  . Right foot pain 04/02/2015  . Neutrophilic leukocytosis 62/69/4854  . Esophageal abnormality   . HCAP (healthcare-associated pneumonia)   . COPD exacerbation ([place])   . Dyspnea   . Respiratory distress   . Sepsis due to pneumonia ([place]) 02/14/2015  . Acute respiratory failure with hypoxia ([place]) 02/13/2015  . Diabetes mellitus with neurologic complication, without long-term current use of insulin ([place]) 07/20/2011  . Essential hypertension   . COPD (chronic obstructive pulmonary disease) ([place])     Past Surgical History:  Procedure Laterality Date  . AMPUTATION Right 04/23/2015   Procedure: Right Fifth Toe Amputation;  Surgeon: Conrad Triadelphia, MD;  Location: [facility];  Service: Vascular;  Laterality: Right;  . AMPUTATION Right 08/27/2015   Procedure: RIGHT BELOW KNEE AMPUTATION ;  Surgeon: Newt Minion, MD;  Location: [facility];  Service: Orthopedics;  Laterality: Right;  . AORTA - BILATERAL FEMORAL ARTERY BYPASS GRAFT Bilateral 04/23/2015   Procedure: AORTOBIFEMORAL BYPASS GRAFT;  Surgeon: Conrad Waubay, MD;  Location: [facility];  Service: Vascular;  Laterality: Bilateral;  . FEMORAL-FEMORAL BYPASS GRAFT Left 04/23/2015   Procedure:  Left Common Femoral Artery to Superficial Femoral Artery Bypass Graft with Vein;  Surgeon: Conrad , MD;  Location: [facility];  Service: Vascular;  Laterality: Left;  . FEMORAL-TIBIAL BYPASS GRAFT Right 04/23/2015   Procedure: Right Common Femoral to Posterior Tibial Bypass with Composite Vein Graft. ;  Surgeon: Conrad Loa, MD;  Location: [facility];  Service: Vascular;  Laterality: Right;  . PERIPHERAL VASCULAR CATHETERIZATION N/A 04/07/2015   Procedure: Abdominal Aortogram;  Surgeon: Angelia Mould, MD;  Location: [facility];  Service: Cardiovascular;  Laterality: N/A;       Home Medications    Prior to  Admission medications   Medication Sig Start Date End Date Taking? Authorizing Provider  acetaminophen (TYLENOL) 325 MG tablet Take 650 mg by mouth every 6 (six) hours as needed for mild pain, fever or headache.    Yes Historical Provider, MD  albuterol (PROAIR HFA) 108 (90 Base) MCG/ACT inhaler Inhale 2 puffs into the lungs every 6 (six) hours as needed for shortness of breath (cough).   Yes Historical Provider, MD  aspirin EC 81 MG tablet Take 1 tablet (81 mg total) by mouth daily. 05/01/15  Yes Alvia Grove, PA-C  cloZAPine (CLOZARIL) 100 MG tablet Take 1-4 tablets (100-400 mg total) by mouth 3 (three) times daily. Take 1 tablet (100 mg) by mouth daily at 8am and 4pm, take 4 tablets (400 mg) at bedtime Patient taking differently: Take 100 mg by mouth 2 (two) times daily.  08/28/15  Yes Ripudeep K Rai, MD  donepezil (ARICEPT) 5 MG tablet Take 5 mg by mouth at bedtime.   Yes Historical Provider, MD  Emollient [facility] EX) Apply 1 application topically daily.   Yes Historical Provider, MD  gabapentin (NEURONTIN) 300 MG capsule Take 300 mg by mouth 3 (three) times daily.   Yes Historical Provider, MD  guaifenesin (ROBITUSSIN) 100 MG/5ML syrup Take 200 mg by mouth 4 (four) times daily as needed for cough.   Yes Historical Provider, MD  haloperidol (HALDOL) 5 MG tablet Take 1 tablet (5 mg total) by mouth at bedtime. 08/28/15  Yes Ripudeep Krystal Eaton, MD  lactulose (CHRONULAC) 10 GM/15ML solution Take 30 g by mouth 2 (two) times daily.   Yes Historical Provider, MD  metoprolol tartrate (LOPRESSOR) 25 MG tablet Take 25 mg by mouth 2 (two) times daily.   Yes Historical Provider, MD  Multiple Vitamin (MULITIVITAMIN WITH MINERALS) TABS Take 1 tablet by mouth daily.   Yes Historical Provider, MD  pantoprazole (PROTONIX) 40 MG tablet Take 1 tablet (40 mg total) by mouth daily. 02/20/15  Yes Theodis Blaze, MD  polyethylene glycol [facility]) packet Take 17 g by mouth daily as needed (constipation). Mix in 8  oz liquid and drink   Yes Historical Provider, MD  senna-docusate (SENOKOT-S) 8.6-50 MG tablet Take 2 tablets by mouth at bedtime. 09/09/15  Yes Ivan Anchors Love, PA-C  simvastatin (ZOCOR) 5 MG tablet Take 5 mg by mouth at bedtime.   Yes Historical Provider, MD  trihexyphenidyl (ARTANE) 5 MG tablet Take 1 tablet (5 mg total) by mouth 2 (two) times daily with a meal. 08/28/15  Yes Ripudeep K Rai, MD  hydrocerin (EUCERIN) CREA Apply 573 application topically 2 (two) times daily. Patient not taking: Reported on 03/28/2016 09/09/15   Ivan Anchors Love, PA-C  ipratropium-albuterol (DUONEB) 0.5-2.5 (3) MG/3ML SOLN Take 3 mLs by nebulization 3 (three) times daily. Patient not taking: Reported on 03/24/2016 02/19/15   Theodis Blaze, MD  mupirocin ointment (BACTROBAN) 2 % Apply topically daily. Patient not taking: Reported on 03/24/2016 09/09/15   Bary Leriche,  PA-C  nicotine (NICODERM CQ - DOSED IN MG/24 HOURS) 21 mg/24hr patch Place 1 patch (21 mg total) onto the skin daily. Patient not taking: Reported on 03/24/2016 09/09/15   Ivan Anchors Love, PA-C  ondansetron (ZOFRAN) 4 MG tablet Take 1 tablet (4 mg total) by mouth every 6 (six) hours. 03/28/16   Dorie Rank, MD    Family History Family History  Problem Relation Age of Onset  . Mental illness Mother     Social History Social History  Substance Use Topics  . Smoking status: Current Every Day Smoker    Packs/day: 1.50    Years: 50.00    Types: Cigarettes  . Smokeless tobacco: Never Used  . Alcohol use No     Allergies   Patient has no known allergies.   Review of Systems Review of Systems  All other systems reviewed and are negative.    Physical Exam Updated Vital Signs BP 162/83 (BP Location: Right Arm)   Pulse (!) 55   Temp 98.3 F (36.8 C) (Oral)   Resp 18   Ht '5\' 11"'$  (1.803 m)   Wt 72.6 kg   SpO2 96%   BMI 22.32 kg/m   Physical Exam  Constitutional: He appears well-developed and well-nourished. No distress.  HENT:  Head:  Normocephalic and atraumatic.  Right Ear: External ear normal.  Left Ear: External ear normal.  Eyes: Conjunctivae are normal. Right eye exhibits no discharge. Left eye exhibits no discharge. No scleral icterus.  Neck: Neck supple. No tracheal deviation present.  Cardiovascular: Normal rate, regular rhythm and intact distal pulses.   Pulmonary/Chest: Effort normal and breath sounds normal. No stridor. No respiratory distress. He has no wheezes. He has no rales.  Abdominal: Soft. Bowel sounds are normal. He exhibits no distension. There is no tenderness. There is no rebound and no guarding.  Musculoskeletal: He exhibits no edema or tenderness.  Neurological: He is alert. He has normal strength. No cranial nerve deficit (no facial droop, extraocular movements intact, no slurred speech) or sensory deficit. He exhibits normal muscle tone. He displays no seizure activity. Coordination normal.  Skin: Skin is warm and dry. No rash noted.  Psychiatric: He has a normal mood and affect.  Nursing note and vitals reviewed.    ED Treatments / Results  Labs (all labs ordered are listed, but only abnormal results are displayed) Labs Reviewed  CBC WITH DIFFERENTIAL/PLATELET - Abnormal; Notable for the following:       Result Value   Hemoglobin 12.3 (*)    All other components within normal limits  COMPREHENSIVE METABOLIC PANEL - Abnormal; Notable for the following:    Potassium 3.3 (*)    Glucose, Bld 101 (*)    All other components within normal limits  URINALYSIS, ROUTINE W REFLEX MICROSCOPIC - Abnormal; Notable for the following:    Ketones, ur 5 (*)    Squamous Epithelial / LPF 0-5 (*)    All other components within normal limits  LIPASE, BLOOD    EKG  EKG Interpretation  Date/Time:  Sunday March 28 2016 08:44:28 EST Ventricular Rate:  58 PR Interval:    QRS Duration: 98 QT Interval:  450 QTC Calculation: 442 R Axis:   76 Text Interpretation:  Sinus rhythm No significant change  since last tracing Confirmed by Tamela Elsayed  MD-J, Erikah Thumm ([id number]) on 03/28/2016 8:50:12 AM       Radiology Dg Chest 2 View  Result Date: 03/28/2016 CLINICAL DATA:  Cough. EXAM: CHEST  2 VIEW  COMPARISON:  Radiographs of March 24, 2016. FINDINGS: The heart size and mediastinal contours are within normal limits. No pneumothorax or pleural effusion is noted. Atherosclerosis of thoracic aorta is noted. Both lungs are clear. The visualized skeletal structures are unremarkable. IMPRESSION: No active cardiopulmonary disease.  Aortic atherosclerosis. Electronically Signed   By: Marijo Conception, M.D.   On: 03/28/2016 09:03    Procedures Procedures (including critical care time)  Medications Ordered in ED Medications  sodium chloride 0.9 % bolus 1,000 mL (0 mLs Intravenous Stopped 03/28/16 0935)  ondansetron (ZOFRAN) injection 4 mg (4 mg Intravenous Given 03/28/16 0835)     Initial Impression / Assessment and Plan / ED Course  I have reviewed the triage vital signs and the nursing notes.  Pertinent labs & imaging results that were available during my care of the patient were reviewed by me and considered in my medical decision making (see chart for details).   patient's laboratory tests are reassuring. No evidence to suggest hepatitis, pancreatitis, dehydration or urinary tract infection. Patient was treated with IV fluids and antinausea medications.  His symptoms improved significantly. Patient was able to eat a sandwich and drink some coffee. He feels well enough to go home.  Patient mentions some issues with hallucinations.   This is a chronic issue for him. He does not appear to be psychotic or delusional at this time. No intent to harm himself or anyone else. I do not think He requires any acute psychiatric intervention  Final Clinical Impressions(s) / ED Diagnoses   Final diagnoses:  Nausea    New Prescriptions New Prescriptions   ONDANSETRON (ZOFRAN) 4 MG TABLET    Take 1 tablet (4 mg total) by  mouth every 6 (six) hours.     Dorie Rank, MD 03/28/16 1210

## 2016-03-28 NOTE — ED Notes (Signed)
Pt stated that he wants to know plan of care.  I told him I would inform the nurse.

## 2016-03-28 NOTE — ED Triage Notes (Signed)
Patient presents to [facility] for complaints of auditory and visual hallucinations as well as general somatic pain complaints. Appears these complaints are chronic and recurrent for patient. Pt says he has not slept in 9 days. Currently resides at [facility], [address], [place], [place]. This is believed to be assisted living and pt is fairly new to facility. Hx of schizophrenia. Pt med list provided to EMS.   Vitals BP162/78, HR 52, RR 18, O2 98% on RA, CBG 138.

## 2016-03-28 NOTE — Discharge Instructions (Signed)
Follow-up with your doctor next week if her symptoms persist, return as needed for fever, pain, worsening symptoms

## 2016-03-28 NOTE — ED Notes (Signed)
[facility] called  

## 2016-03-28 NOTE — ED Notes (Signed)
Report called to Lora Paula, RN at [facility].

## 2016-03-28 NOTE — ED Notes (Signed)
Bed: WHALD Expected date:  Expected time:  Means of arrival:  Comments: 

## 2016-03-28 NOTE — ED Notes (Signed)
Bed: UV22 Expected date: 03/28/16 Expected time: 7:35 AM Means of arrival: Ambulance Comments: Sick vs Med Clearance

## 2016-04-02 ENCOUNTER — Encounter: Payer: Medicaid Other | Attending: Physical Medicine & Rehabilitation | Admitting: Physical Medicine & Rehabilitation

## 2016-04-02 DIAGNOSIS — Z89511 Acquired absence of right leg below knee: Secondary | ICD-10-CM | POA: Insufficient documentation

## 2016-04-02 DIAGNOSIS — T8130XA Disruption of wound, unspecified, initial encounter: Secondary | ICD-10-CM | POA: Insufficient documentation

## 2016-04-02 DIAGNOSIS — Z5189 Encounter for other specified aftercare: Secondary | ICD-10-CM | POA: Insufficient documentation

## 2016-04-02 DIAGNOSIS — E1152 Type 2 diabetes mellitus with diabetic peripheral angiopathy with gangrene: Secondary | ICD-10-CM | POA: Insufficient documentation

## 2016-04-02 DIAGNOSIS — R269 Unspecified abnormalities of gait and mobility: Secondary | ICD-10-CM | POA: Insufficient documentation

## 2016-04-02 DIAGNOSIS — J449 Chronic obstructive pulmonary disease, unspecified: Secondary | ICD-10-CM | POA: Insufficient documentation

## 2016-04-02 DIAGNOSIS — F209 Schizophrenia, unspecified: Secondary | ICD-10-CM | POA: Insufficient documentation

## 2016-04-25 ENCOUNTER — Emergency Department (HOSPITAL_COMMUNITY)
Admission: EM | Admit: 2016-04-25 | Discharge: 2016-04-27 | Disposition: A | Discharge status: Home / Self Care | Payer: Medicaid Other | Attending: Emergency Medicine | Admitting: Emergency Medicine

## 2016-04-25 ENCOUNTER — Encounter (HOSPITAL_COMMUNITY): Payer: Self-pay | Admitting: Nurse Practitioner

## 2016-04-25 DIAGNOSIS — Z7984 Long term (current) use of oral hypoglycemic drugs: Secondary | ICD-10-CM | POA: Insufficient documentation

## 2016-04-25 DIAGNOSIS — F17210 Nicotine dependence, cigarettes, uncomplicated: Secondary | ICD-10-CM | POA: Diagnosis not present

## 2016-04-25 DIAGNOSIS — R440 Auditory hallucinations: Secondary | ICD-10-CM | POA: Insufficient documentation

## 2016-04-25 DIAGNOSIS — Z7982 Long term (current) use of aspirin: Secondary | ICD-10-CM | POA: Diagnosis not present

## 2016-04-25 DIAGNOSIS — F4325 Adjustment disorder with mixed disturbance of emotions and conduct: Secondary | ICD-10-CM | POA: Diagnosis not present

## 2016-04-25 DIAGNOSIS — I10 Essential (primary) hypertension: Secondary | ICD-10-CM | POA: Insufficient documentation

## 2016-04-25 DIAGNOSIS — Z79899 Other long term (current) drug therapy: Secondary | ICD-10-CM | POA: Insufficient documentation

## 2016-04-25 DIAGNOSIS — J449 Chronic obstructive pulmonary disease, unspecified: Secondary | ICD-10-CM | POA: Insufficient documentation

## 2016-04-25 DIAGNOSIS — Z818 Family history of other mental and behavioral disorders: Secondary | ICD-10-CM | POA: Diagnosis not present

## 2016-04-25 DIAGNOSIS — R45851 Suicidal ideations: Secondary | ICD-10-CM

## 2016-04-25 DIAGNOSIS — E119 Type 2 diabetes mellitus without complications: Secondary | ICD-10-CM | POA: Diagnosis not present

## 2016-04-25 DIAGNOSIS — F200 Paranoid schizophrenia: Secondary | ICD-10-CM | POA: Diagnosis not present

## 2016-04-25 DIAGNOSIS — F209 Schizophrenia, unspecified: Secondary | ICD-10-CM

## 2016-04-25 DIAGNOSIS — R443 Hallucinations, unspecified: Secondary | ICD-10-CM

## 2016-04-25 LAB — CBC
HCT: 38.6 % — ABNORMAL LOW (ref 39.0–52.0)
Hemoglobin: 12.3 g/dL — ABNORMAL LOW (ref 13.0–17.0)
MCH: 27.2 pg (ref 26.0–34.0)
MCHC: 31.9 g/dL (ref 30.0–36.0)
MCV: 85.2 fL (ref 78.0–100.0)
PLATELETS: 267 10*3/uL (ref 150–400)
RBC: 4.53 MIL/uL (ref 4.22–5.81)
RDW: 13.1 % (ref 11.5–15.5)
WBC: 9.5 10*3/uL (ref 4.0–10.5)

## 2016-04-25 LAB — COMPREHENSIVE METABOLIC PANEL
ALT: 21 U/L (ref 17–63)
AST: 19 U/L (ref 15–41)
Albumin: 4.1 g/dL (ref 3.5–5.0)
Alkaline Phosphatase: 80 U/L (ref 38–126)
Anion gap: 6 (ref 5–15)
BILIRUBIN TOTAL: 0.5 mg/dL (ref 0.3–1.2)
BUN: 15 mg/dL (ref 6–20)
CO2: 31 mmol/L (ref 22–32)
Calcium: 9.5 mg/dL (ref 8.9–10.3)
Chloride: 104 mmol/L (ref 101–111)
Creatinine, Ser: 0.83 mg/dL (ref 0.61–1.24)
Glucose, Bld: 79 mg/dL (ref 65–99)
POTASSIUM: 3.8 mmol/L (ref 3.5–5.1)
Sodium: 141 mmol/L (ref 135–145)
TOTAL PROTEIN: 7.3 g/dL (ref 6.5–8.1)

## 2016-04-25 LAB — ACETAMINOPHEN LEVEL: Acetaminophen (Tylenol), Serum: <10 ug/mL — ABNORMAL LOW (ref 10–30)

## 2016-04-25 LAB — ETHANOL

## 2016-04-25 LAB — SALICYLATE LEVEL

## 2016-04-25 MED ORDER — NICOTINE 21 MG/24HR TD PT24: 21.0000 mg | MEDICATED_PATCH | Freq: Every day | TRANSDERMAL | Status: DC | PRN

## 2016-04-25 MED ORDER — ASPIRIN EC 81 MG PO TBEC
81.0000 mg | DELAYED_RELEASE_TABLET | Freq: Every day | ORAL | Status: DC
  Administered 2016-04-26 – 2016-04-27 (×2): 81 mg via ORAL
  Filled 2016-04-25 (×2): qty 1

## 2016-04-25 MED ORDER — METFORMIN HCL 500 MG PO TABS
500.0000 mg | ORAL_TABLET | Freq: Two times a day (BID) | ORAL | Status: DC
  Administered 2016-04-26 – 2016-04-27 (×3): 500 mg via ORAL
  Filled 2016-04-25 (×3): qty 1

## 2016-04-25 MED ORDER — ACETAMINOPHEN 325 MG PO TABS: 650.0000 mg | ORAL_TABLET | ORAL | Status: DC | PRN

## 2016-04-25 MED ORDER — DONEPEZIL HCL 5 MG PO TABS
5.0000 mg | ORAL_TABLET | Freq: Every day | ORAL | Status: DC
  Administered 2016-04-26 (×2): 5 mg via ORAL
  Filled 2016-04-25 (×2): qty 1

## 2016-04-25 MED ORDER — METOPROLOL TARTRATE 25 MG PO TABS
25.0000 mg | ORAL_TABLET | Freq: Two times a day (BID) | ORAL | Status: DC
  Administered 2016-04-26 – 2016-04-27 (×4): 25 mg via ORAL
  Filled 2016-04-25 (×4): qty 1

## 2016-04-25 MED ORDER — ONDANSETRON HCL 4 MG PO TABS: 4.0000 mg | ORAL_TABLET | Freq: Three times a day (TID) | ORAL | Status: DC | PRN

## 2016-04-25 MED ORDER — LISINOPRIL 5 MG PO TABS
5.0000 mg | ORAL_TABLET | Freq: Every day | ORAL | Status: DC
  Administered 2016-04-26 – 2016-04-27 (×2): 5 mg via ORAL
  Filled 2016-04-25 (×2): qty 1

## 2016-04-25 MED ORDER — HALOPERIDOL 5 MG PO TABS
5.0000 mg | ORAL_TABLET | Freq: Every day | ORAL | Status: DC
  Administered 2016-04-26 (×2): 5 mg via ORAL
  Filled 2016-04-25 (×2): qty 1

## 2016-04-25 MED ORDER — ZOLPIDEM TARTRATE 5 MG PO TABS: 5.0000 mg | ORAL_TABLET | Freq: Every evening | ORAL | Status: DC | PRN

## 2016-04-25 MED ORDER — GABAPENTIN 300 MG PO CAPS
300.0000 mg | ORAL_CAPSULE | Freq: Three times a day (TID) | ORAL | Status: DC
  Administered 2016-04-26 – 2016-04-27 (×5): 300 mg via ORAL
  Filled 2016-04-25 (×5): qty 1

## 2016-04-25 MED ORDER — ALPRAZOLAM 0.5 MG PO TABS
0.5000 mg | ORAL_TABLET | Freq: Three times a day (TID) | ORAL | Status: DC
  Administered 2016-04-26: 0.5 mg via ORAL
  Filled 2016-04-25: qty 1

## 2016-04-25 MED ORDER — TRIHEXYPHENIDYL HCL 5 MG PO TABS
5.0000 mg | ORAL_TABLET | Freq: Two times a day (BID) | ORAL | Status: DC
  Administered 2016-04-26 – 2016-04-27 (×4): 5 mg via ORAL
  Filled 2016-04-25 (×4): qty 1

## 2016-04-25 MED ORDER — CLOZAPINE 100 MG PO TABS
100.0000 mg | ORAL_TABLET | ORAL | Status: DC
  Administered 2016-04-26 – 2016-04-27 (×3): 100 mg via ORAL
  Filled 2016-04-25 (×5): qty 1

## 2016-04-25 MED ORDER — ALUM & MAG HYDROXIDE-SIMETH 200-200-20 MG/5ML PO SUSP: 30.0000 mL | ORAL | Status: DC | PRN

## 2016-04-25 MED ORDER — ALBUTEROL SULFATE HFA 108 (90 BASE) MCG/ACT IN AERS: 2.0000 | INHALATION_SPRAY | Freq: Four times a day (QID) | RESPIRATORY_TRACT | Status: DC | PRN

## 2016-04-25 MED ORDER — IBUPROFEN 200 MG PO TABS: 600.0000 mg | ORAL_TABLET | Freq: Three times a day (TID) | ORAL | Status: DC | PRN

## 2016-04-25 NOTE — ED Provider Notes (Signed)
[facility] DEPT Provider Note   CSN: [id number] Arrival date & time: 04/25/16  2152     History   Chief Complaint Chief Complaint  Patient presents with  . Medical Clearance  . IVC  . Psychiatric Evaluation    HPI Craig Bentley is a 61 y.o. male.  The patient lives in an assisted living facility, where he has become more combative than usual.  He states that he has been hallucinating and hears voices that tell him to "cut my heart out".  He feels like it is time for him to die.  He admits to having schizophrenia, and states "I need to be in a institution."  He denies other recent problems including fevers chills vomiting or chest pain.  There are no other known modifying factors.  HPI  Past Medical History:  Diagnosis Date  . Anxiety   . Constipation   . COPD (chronic obstructive pulmonary disease) ([place])   . Diabetes mellitus   . Fecal impaction ([place])   . Hypertension   . Pneumonia   . Schizophrenia, schizo-affective [facility])     Patient Active Problem List   Diagnosis Date Noted  . Low grade fever   . Surgical wound dehiscence   . Opacity of lung on imaging study   . Acute blood loss anemia   . Fever   . SIRS (systemic inflammatory response syndrome) (HCC)   . Leukocytosis   . Status post below knee amputation of right lower extremity ([place]) 08/28/2015  . 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[place]) 08/23/2015  . Sepsis ([place]) 08/21/2015  . ARF (acute renal failure) ([place]) 08/21/2015  . Acute encephalopathy 08/21/2015  . Post-operative state   . Respiratory failure ([place])   . Surgery, elective   . Anxiety   . Schizophrenia ([place])   . Atherosclerosis of extremity with gangrene ([place])   . Hyperglycemia   . Preoperative cardiovascular examination 04/11/2015  . PVD (peripheral vascular disease) ([place]) 04/10/2015  . Absolute anemia   . Diabetic foot infection ([place]) 04/02/2015  . Right foot pain 04/02/2015  . Neutrophilic leukocytosis 62/69/4854  . Esophageal abnormality   . HCAP  (healthcare-associated pneumonia)   . COPD exacerbation ()   . Dyspnea   . Respiratory distress   . Sepsis due to pneumonia ([place]) 02/14/2015  . Acute respiratory failure with hypoxia ([place]) 02/13/2015  . Diabetes mellitus with neurologic complication, without long-term current use of insulin ([place]) 07/20/2011  . Essential hypertension   . COPD (chronic obstructive pulmonary disease) ([place])     Past Surgical History:  Procedure Laterality Date  . AMPUTATION Right 04/23/2015   Procedure: Right Fifth Toe Amputation;  Surgeon: Conrad Glacier, MD;  Location: [place];  Service: Vascular;  Laterality: Right;  . AMPUTATION Right 08/27/2015   Procedure: RIGHT BELOW KNEE AMPUTATION ;  Surgeon: Newt Minion, MD;  Location: [place];  Service: Orthopedics;  Laterality: Right;  . AORTA - BILATERAL FEMORAL ARTERY BYPASS GRAFT Bilateral 04/23/2015   Procedure: AORTOBIFEMORAL BYPASS GRAFT;  Surgeon: Conrad Fernley, MD;  Location: [place];  Service: Vascular;  Laterality: Bilateral;  . FEMORAL-FEMORAL BYPASS GRAFT Left 04/23/2015   Procedure:  Left Common Femoral Artery to Superficial Femoral Artery Bypass Graft with Vein;  Surgeon: Conrad Union, MD;  Location: [place];  Service: Vascular;  Laterality: Left;  . FEMORAL-TIBIAL BYPASS GRAFT Right 04/23/2015   Procedure: Right Common Femoral to Posterior Tibial Bypass with Composite Vein Graft. ;  Surgeon: Conrad , MD;  Location: [place];  Service: Vascular;  Laterality: Right;  . PERIPHERAL VASCULAR CATHETERIZATION N/A 04/07/2015   Procedure: Abdominal Aortogram;  Surgeon: Angelia Mould, MD;  Location: [place] CV [facility];  Service: Cardiovascular;  Laterality: N/A;       Home Medications    Prior to Admission medications   Medication Sig Start Date End Date Taking? Authorizing Provider  acetaminophen (TYLENOL) 325 MG tablet Take 650 mg by mouth every 6 (six) hours as needed for mild pain, fever or headache.    Yes Historical Provider, MD  albuterol (PROAIR HFA)  108 (90 Base) MCG/ACT inhaler Inhale 2 puffs into the lungs every 6 (six) hours as needed for shortness of breath (cough).   Yes Historical Provider, MD  ALPRAZolam Duanne Moron) 0.5 MG tablet Take 0.5 mg by mouth 3 (three) times daily.   Yes Historical Provider, MD  gabapentin (NEURONTIN) 300 MG capsule Take 300 mg by mouth 3 (three) times daily.   Yes Historical Provider, MD  guaifenesin (ROBITUSSIN) 100 MG/5ML syrup Take 200 mg by mouth 4 (four) times daily as needed for cough.   Yes Historical Provider, MD  lactulose (CHRONULAC) 10 GM/15ML solution Take 30 g by mouth 2 (two) times daily.   Yes Historical Provider, MD  metFORMIN (GLUCOPHAGE) 500 MG tablet Take 500 mg by mouth 2 (two) times daily with a meal.   Yes Historical Provider, MD  metoprolol tartrate (LOPRESSOR) 25 MG tablet Take 25 mg by mouth 2 (two) times daily.   Yes Historical Provider, MD  Multiple Vitamin (MULITIVITAMIN WITH MINERALS) TABS Take 1 tablet by mouth daily.   Yes Historical Provider, MD  polyethylene glycol (MIRALAX / GLYCOLAX) packet Take 17 g by mouth daily as needed (constipation). Mix in 8 oz liquid and drink   Yes Historical Provider, MD  senna-docusate (SENOKOT-S) 8.6-50 MG tablet Take 2 tablets by mouth at bedtime. 09/09/15  Yes Ivan Anchors Love, PA-C  simvastatin (ZOCOR) 5 MG tablet Take 5 mg by mouth at bedtime.   Yes Historical Provider, MD  Skin Protectants, Misc. (MINERIN) CREA Apply 1 application topically 2 (two) times daily.   Yes Historical Provider, MD  trihexyphenidyl (ARTANE) 2 MG tablet Take 5 mg by mouth 2 (two) times daily.   Yes Historical Provider, MD  aspirin EC 81 MG tablet Take 1 tablet (81 mg total) by mouth daily. 05/01/15   Alvia Grove, PA-C  cloZAPine (CLOZARIL) 100 MG tablet Take 1-4 tablets (100-400 mg total) by mouth 3 (three) times daily. Take 1 tablet (100 mg) by mouth daily at 8am and 4pm, take 4 tablets (400 mg) at bedtime Patient taking differently: Take 100 mg by mouth 2 (two) times daily.   08/28/15   Ripudeep Krystal Eaton, MD  donepezil (ARICEPT) 5 MG tablet Take 5 mg by mouth at bedtime.    Historical Provider, MD  haloperidol (HALDOL) 5 MG tablet Take 1 tablet (5 mg total) by mouth at bedtime. 08/28/15   Ripudeep Krystal Eaton, MD  hydrocerin (EUCERIN) CREA Apply 789 application topically 2 (two) times daily. Patient not taking: Reported on 03/28/2016 09/09/15   Ivan Anchors Love, PA-C  ipratropium-albuterol (DUONEB) 0.5-2.5 (3) MG/3ML SOLN Take 3 mLs by nebulization 3 (three) times daily. Patient not taking: Reported on 03/24/2016 02/19/15   Theodis Blaze, MD  mupirocin ointment (BACTROBAN) 2 % Apply topically daily. Patient not taking: Reported on 03/24/2016 09/09/15   Ivan Anchors Love, PA-C  nicotine (NICODERM CQ - DOSED IN MG/24 HOURS) 21 mg/24hr patch Place 1 patch (21  mg total) onto the skin daily. Patient not taking: Reported on 03/24/2016 09/09/15   Ivan Anchors Love, PA-C  ondansetron (ZOFRAN) 4 MG tablet Take 1 tablet (4 mg total) by mouth every 6 (six) hours. 03/28/16   Dorie Rank, MD  pantoprazole (PROTONIX) 40 MG tablet Take 1 tablet (40 mg total) by mouth daily. 02/20/15   Theodis Blaze, MD  trihexyphenidyl (ARTANE) 5 MG tablet Take 1 tablet (5 mg total) by mouth 2 (two) times daily with a meal. Patient not taking: Reported on 04/25/2016 08/28/15   Ripudeep Krystal Eaton, MD    Family History Family History  Problem Relation Age of Onset  . Mental illness Mother     Social History Social History  Substance Use Topics  . Smoking status: Current Every Day Smoker    Packs/day: 1.50    Years: 50.00    Types: Cigarettes  . Smokeless tobacco: Never Used  . Alcohol use No     Allergies   Patient has no known allergies.   Review of Systems Review of Systems  All other systems reviewed and are negative.    Physical Exam Updated Vital Signs BP (!) 116/53 (BP Location: Right Arm)   Pulse (!) 54   Temp 98.3 F (36.8 C) (Oral)   Resp 16   Ht '5\' 11"'$  (1.803 m)   Wt 160 lb (72.6 kg)   SpO2 99%    BMI 22.32 kg/m   Physical Exam  Constitutional: He appears well-developed and well-nourished. No distress.  He is pleasant and cooperative  HENT:  Head: Normocephalic and atraumatic.  Right Ear: External ear normal.  Left Ear: External ear normal.  Edentulous  Eyes: Conjunctivae and EOM are normal. Pupils are equal, round, and reactive to light.  Neck: Normal range of motion and phonation normal. Neck supple.  Cardiovascular: Normal rate, regular rhythm and normal heart sounds.   Pulmonary/Chest: Effort normal and breath sounds normal. He exhibits no bony tenderness.  Abdominal: Soft. There is no tenderness.  Musculoskeletal: Normal range of motion.  Right leg BKA.  Neurological: He is alert. No cranial nerve deficit or sensory deficit. He exhibits normal muscle tone. Coordination normal.  Skin: Skin is warm, dry and intact.  Psychiatric: He has a normal mood and affect. His behavior is normal.  Pressured speech, flight of ideas.  Stated audio hallucination.  Nursing note and vitals reviewed.    ED Treatments / Results  Labs (all labs ordered are listed, but only abnormal results are displayed) Labs Reviewed  ACETAMINOPHEN LEVEL - Abnormal; Notable for the following:       Result Value   Acetaminophen (Tylenol), Serum <10 (*)    All other components within normal limits  CBC - Abnormal; Notable for the following:    Hemoglobin 12.3 (*)    HCT 38.6 (*)    All other components within normal limits  COMPREHENSIVE METABOLIC PANEL  ETHANOL  SALICYLATE LEVEL  RAPID URINE DRUG SCREEN, HOSP PERFORMED    EKG  EKG Interpretation  Date/Time:  Sunday April 25 2016 22:45:58 EST Ventricular Rate:  54 PR Interval:    QRS Duration: 93 QT Interval:  433 QTC Calculation: 411 R Axis:   69 Text Interpretation:  Sinus rhythm Minimal ST elevation, inferior leads Since last tracing No significant change was found Confirmed by Eulis Foster  MD, Marcie Shearon ([id number]) on 04/25/2016 11:14:17 PM        Radiology No results found.  Procedures Procedures (including critical care time)  Medications Ordered  in ED Medications - No data to display   Initial Impression / Assessment and Plan / ED Course  I have reviewed the triage vital signs and the nursing notes.  Pertinent labs & imaging results that were available during my care of the patient were reviewed by me and considered in my medical decision making (see chart for details).  Clinical Course as of Apr 25 2341  Sun Apr 25, 2016  2341 He is medically cleared for treatment by psychiatry  [EW]    Clinical Course User Index [EW] Daleen Bo, MD    Medications - No data to display  Patient Vitals for the past 24 hrs:  BP Temp Temp src Pulse Resp SpO2 Height Weight  04/25/16 2205 - - - - - 99 % - -  04/25/16 2203 - - - - - - '5\' 11"'$  (1.803 m) 160 lb (72.6 kg)  04/25/16 2202 (!) 116/53 98.3 F (36.8 C) Oral (!) 54 16 98 % - -  04/25/16 2201 - - - - - 94 % - -    TTS consult   Final Clinical Impressions(s) / ED Diagnoses   Final diagnoses:  Schizophrenia, unspecified type ([place])  Suicidal ideation  Hallucination    Chronic schizophrenia, apparently worsened with combative behavior and audio hallucinations, command to kill self.  Patient has decompensated and will require evaluation by psychiatry possible medication adjustment vs. Admission  Nursing Notes Reviewed/ Care Coordinated Applicable Imaging Reviewed Interpretation of Laboratory Data incorporated into ED treatment  Plan: As per TTS in conjunction with coming provider team  New Prescriptions New Prescriptions   No medications on file     Daleen Bo, MD 04/25/16 2343

## 2016-04-25 NOTE — ED Notes (Signed)
MADE 1ST REQUEST FOR URINE SAMPLE,PATIENT UNABLE TO PROVIDE ONE AT THIS TIME.

## 2016-04-25 NOTE — ED Notes (Signed)
Patient continues to talk nonstop. Often stopping as if he is listening to a voice and then responding. Holding a conversation alone. States "I am going to kill myself let's figure out how. It's time for this Nigger to leave this earth!" He is calm and cooperative when prompted and able to answer questions appropriately. A&O x4. After following commands or answering questions he continues with nonstop repetitive talking.

## 2016-04-25 NOTE — ED Triage Notes (Signed)
Pt brought in via [facility] and [facility] from [facility] facility for complaints of auditory hallucinations and combativeness at the facility. Pt states he has not slept in 3 days and voices continue telling him to kill himself. He has a hx of schizophrenia. Med [facility] to [facility]. Patient has repetitive disassociated talking.

## 2016-04-26 LAB — URINALYSIS, ROUTINE W REFLEX MICROSCOPIC
Bilirubin Urine: NEGATIVE
Glucose, UA: NEGATIVE mg/dL
HGB URINE DIPSTICK: NEGATIVE
Ketones, ur: NEGATIVE mg/dL
Leukocytes, UA: NEGATIVE
NITRITE: NEGATIVE
Protein, ur: NEGATIVE mg/dL
SPECIFIC GRAVITY, URINE: 1.008 (ref 1.005–1.030)
pH: 5 (ref 5.0–8.0)

## 2016-04-26 LAB — DIFFERENTIAL
BASOS ABS: 0 10*3/uL (ref 0.0–0.1)
Basophils Relative: 0 %
Eosinophils Absolute: 0 10*3/uL (ref 0.0–0.7)
Eosinophils Relative: 0 %
LYMPHS ABS: 3.3 10*3/uL (ref 0.7–4.0)
LYMPHS PCT: 35 %
Monocytes Absolute: 0.6 10*3/uL (ref 0.1–1.0)
Monocytes Relative: 6 %
NEUTROS PCT: 59 %
Neutro Abs: 5.6 10*3/uL (ref 1.7–7.7)

## 2016-04-26 LAB — CBG MONITORING, ED: GLUCOSE-CAPILLARY: 84 mg/dL (ref 65–99)

## 2016-04-26 MED ORDER — CLOZAPINE 100 MG PO TABS
400.0000 mg | ORAL_TABLET | Freq: Every day | ORAL | Status: DC
  Administered 2016-04-26 (×2): 400 mg via ORAL
  Filled 2016-04-26 (×2): qty 4

## 2016-04-26 NOTE — BH Assessment (Signed)
[facility] Assessment Progress Note  Per Corena Pilgrim, MD, this pt requires psychiatric hospitalization at this time.  Pt presents under IVC initiated by his caregiver and upheld by Dr Darleene Cleaver.  The following facilities have been contacted to seek placement for this pt, with results as noted:  Beds available, information sent, decision pending:  [facility]   Declined:  [facility] (due to medical acuity)   At capacity:  [facility], [place] Triage Specialist [phone]

## 2016-04-26 NOTE — Progress Notes (Signed)
Patient from [employer] [facility].
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
## 2016-04-26 NOTE — ED Notes (Signed)
Per TTS, pt. Is recommended for inpatient treatment.

## 2016-04-26 NOTE — ED Notes (Signed)
Patient resting comfortably in bed. Respirations normal and unlabored. Will administer medications when patient wakes up.

## 2016-04-27 DIAGNOSIS — F4325 Adjustment disorder with mixed disturbance of emotions and conduct: Secondary | ICD-10-CM

## 2016-04-27 DIAGNOSIS — Z818 Family history of other mental and behavioral disorders: Secondary | ICD-10-CM

## 2016-04-27 DIAGNOSIS — F200 Paranoid schizophrenia: Secondary | ICD-10-CM | POA: Diagnosis not present

## 2016-04-27 DIAGNOSIS — F17210 Nicotine dependence, cigarettes, uncomplicated: Secondary | ICD-10-CM | POA: Diagnosis not present

## 2016-04-27 DIAGNOSIS — Z79899 Other long term (current) drug therapy: Secondary | ICD-10-CM

## 2016-04-27 LAB — CBG MONITORING, ED: Glucose-Capillary: 129 mg/dL — ABNORMAL HIGH (ref 65–99)

## 2016-04-27 NOTE — Progress Notes (Signed)
CSW informed [facility] patient has been Paramedic cleared at this time. [facility] requested copy of patients AVS with NP signature on it. CSW faxed needed information to facility. CSW update patient regarding discharge back to facility. CSW will contact [facility] for transportation.   Kingsley Spittle, LCSWA Clinical Social Worker [phone]
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
## 2016-04-27 NOTE — BH Assessment (Signed)
[facility] Assessment Progress Note  Per Corena Pilgrim, MD, this pt does not require psychiatric hospitalization at this time.  Pt presents under IVC initiated by his facility caregiver, which Dr Darleene Cleaver has rescinded.  Pt is to be discharged from [facility] and is to return to facility.  Marius Ditch, LCSW, will be asked to contact the facility to arrange for pt's return.  Pt's nurse has been notified.  Jalene Mullet, [place] Triage Specialist [phone]
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
## 2016-10-05 ENCOUNTER — Encounter: Payer: Self-pay | Admitting: Podiatry

## 2016-10-05 ENCOUNTER — Ambulatory Visit (INDEPENDENT_AMBULATORY_CARE_PROVIDER_SITE_OTHER): Payer: Medicaid Other | Admitting: Podiatry

## 2016-10-05 VITALS — BP 143/88 | HR 95

## 2016-10-05 DIAGNOSIS — B351 Tinea unguium: Secondary | ICD-10-CM

## 2016-10-05 DIAGNOSIS — M79675 Pain in left toe(s): Secondary | ICD-10-CM | POA: Diagnosis not present

## 2016-10-05 DIAGNOSIS — E1159 Type 2 diabetes mellitus with other circulatory complications: Secondary | ICD-10-CM

## 2016-10-05 NOTE — Progress Notes (Signed)
   Subjective:    Patient ID: Craig Bentley, male    DOB: [birth year], 61 y.o.   MRN: [id number]  HPI This patient presents the office with chief complaint of long thick painful toenails on his left foot.  He says these nails are painful walking and wearing his shoes.  This patient presents having had a below-knee amputation of his right leg.  This was performed due to a diabetic foot infection in his right foot in 2017.  This patient is a diabetic on gabapentin.  He presents the office today for an evaluation and treatment of his left foot   Review of Systems  All other systems reviewed and are negative.      Objective:   Physical Exam GENERAL APPEARANCE: Alert, conversant. Appropriately groomed. No acute distress.  VASCULAR: Pedal pulses are barely   palpable at  [facility] and PT left foot. Capillary refill time is immediate to all digits,  Normal temperature gradient.   NEUROLOGIC: sensation is normal to 5.07 monofilament at 5/5 sites left foot.[place] touch is intact left foot. Muscle strength normal.  MUSCULOSKELETAL: acceptable muscle strength, tone and stability bilateral.  Intrinsic muscluature intact bilateral.  Rectus appearance of foot and digits noted bilateral.  NAILS  thick disfigured discolored nails with subungual debris noted, left foot.  No evidence of bacterial infection or drainage DERMATOLOGIC: skin color, texture, and turgor are within normal limits.  No preulcerative lesions or ulcers  are seen, no interdigital maceration noted.  No open lesions present. . No drainage noted.         Assessment & Plan:  Onychomycosis left foot.  Diabetes with angiopathy left foot  Initial exam  de bride mycotic nails left foot.  RTC 3 months   Gardiner Barefoot DPM

## 2017-01-05 ENCOUNTER — Ambulatory Visit: Payer: Medicaid Other | Admitting: Podiatry

## 2017-08-09 ENCOUNTER — Emergency Department (HOSPITAL_COMMUNITY): Payer: Medicaid Other

## 2017-08-09 ENCOUNTER — Emergency Department (HOSPITAL_COMMUNITY)
Admission: EM | Admit: 2017-08-09 | Discharge: 2017-08-10 | Disposition: A | Discharge status: Home / Self Care | Payer: Medicaid Other | Attending: Emergency Medicine | Admitting: Emergency Medicine

## 2017-08-09 ENCOUNTER — Encounter (HOSPITAL_COMMUNITY): Payer: Self-pay | Admitting: Emergency Medicine

## 2017-08-09 DIAGNOSIS — J449 Chronic obstructive pulmonary disease, unspecified: Secondary | ICD-10-CM | POA: Diagnosis not present

## 2017-08-09 DIAGNOSIS — R45851 Suicidal ideations: Secondary | ICD-10-CM | POA: Insufficient documentation

## 2017-08-09 DIAGNOSIS — Z79899 Other long term (current) drug therapy: Secondary | ICD-10-CM | POA: Insufficient documentation

## 2017-08-09 DIAGNOSIS — F259 Schizoaffective disorder, unspecified: Secondary | ICD-10-CM | POA: Insufficient documentation

## 2017-08-09 DIAGNOSIS — F4325 Adjustment disorder with mixed disturbance of emotions and conduct: Secondary | ICD-10-CM | POA: Diagnosis not present

## 2017-08-09 DIAGNOSIS — Z7982 Long term (current) use of aspirin: Secondary | ICD-10-CM | POA: Insufficient documentation

## 2017-08-09 DIAGNOSIS — F17210 Nicotine dependence, cigarettes, uncomplicated: Secondary | ICD-10-CM | POA: Diagnosis not present

## 2017-08-09 DIAGNOSIS — R0789 Other chest pain: Secondary | ICD-10-CM | POA: Insufficient documentation

## 2017-08-09 DIAGNOSIS — Z7984 Long term (current) use of oral hypoglycemic drugs: Secondary | ICD-10-CM | POA: Insufficient documentation

## 2017-08-09 DIAGNOSIS — I10 Essential (primary) hypertension: Secondary | ICD-10-CM | POA: Insufficient documentation

## 2017-08-09 DIAGNOSIS — R079 Chest pain, unspecified: Secondary | ICD-10-CM | POA: Diagnosis present

## 2017-08-09 DIAGNOSIS — E119 Type 2 diabetes mellitus without complications: Secondary | ICD-10-CM | POA: Diagnosis not present

## 2017-08-09 LAB — I-STAT TROPONIN, ED
Troponin i, poc: 0.01 ng/mL (ref 0.00–0.08)
Troponin i, poc: 0 ng/mL (ref 0.00–0.08)

## 2017-08-09 LAB — BASIC METABOLIC PANEL
Anion gap: 10 (ref 5–15)
BUN: 9 mg/dL (ref 6–20)
CHLORIDE: 100 mmol/L — AB (ref 101–111)
CO2: 28 mmol/L (ref 22–32)
CREATININE: 0.7 mg/dL (ref 0.61–1.24)
Calcium: 9.3 mg/dL (ref 8.9–10.3)
GFR calc Af Amer: >60 mL/min (ref >60)
GFR calc non Af Amer: >60 mL/min (ref >60)
Glucose, Bld: 329 mg/dL — ABNORMAL HIGH (ref 65–99)
Potassium: 4 mmol/L (ref 3.5–5.1)
SODIUM: 138 mmol/L (ref 135–145)

## 2017-08-09 LAB — CBC
HEMATOCRIT: 42.4 % (ref 39.0–52.0)
Hemoglobin: 12.4 g/dL — ABNORMAL LOW (ref 13.0–17.0)
MCH: 25.3 pg — ABNORMAL LOW (ref 26.0–34.0)
MCHC: 29.2 g/dL — ABNORMAL LOW (ref 30.0–36.0)
MCV: 86.5 fL (ref 78.0–100.0)
PLATELETS: 263 10*3/uL (ref 150–400)
RBC: 4.9 MIL/uL (ref 4.22–5.81)
RDW: 12.9 % (ref 11.5–15.5)
WBC: 8.9 10*3/uL (ref 4.0–10.5)

## 2017-08-09 LAB — SALICYLATE LEVEL

## 2017-08-09 LAB — ETHANOL

## 2017-08-09 LAB — ACETAMINOPHEN LEVEL: Acetaminophen (Tylenol), Serum: <10 ug/mL — ABNORMAL LOW (ref 10–30)

## 2017-08-09 MED ORDER — TETANUS-DIPHTH-ACELL PERTUSSIS 5-2.5-18.5 LF-MCG/0.5 IM SUSP
0.5000 mL | Freq: Once | INTRAMUSCULAR | Status: AC
  Administered 2017-08-10: 0.5 mL via INTRAMUSCULAR
  Filled 2017-08-09: qty 0.5

## 2017-08-09 NOTE — ED Notes (Signed)
Pt in room .... Called for sitter VS documented

## 2017-08-09 NOTE — ED Provider Notes (Signed)
[facility] EMERGENCY DEPARTMENT Provider Note   CSN: [id number] Arrival date & time: 08/09/17  1803     History   Chief Complaint Chief Complaint  Patient presents with  . Chest Pain  . Suicidal    HPI Craig Bentley is a 62 y.o. male with history of anxiety, schizophrenia, COPD, diabetes mellitus, constipation presents for evaluation of left-sided chest pain which occurred earlier today.  Tells me that the left side of his chest hurts because he was scratching at it "removing a police badge that was in my heart".  he tells me "I took a test and I got in the [employer] ".  He is unable to describe his pain.  Denies any plan and states "I am not sure how I would kill myself ".  He does endorse auditory and visual hallucinations.  He states that he feels that there is a "clot in my right eye "and voices which tell him what to do.  Denies homicidal ideations.  He also tells me that he has been feeling suicidal ever since he got on the ambulance. Denies shortness of breath, fevers, abdominal pain, nausea, vomiting, or any other complaints at this time.   The history is provided by the patient.    Past Medical History:  Diagnosis Date  . Anxiety   . Constipation   . COPD (chronic obstructive pulmonary disease) ([place])   . Diabetes mellitus   . Fecal impaction ([place])   . Hypertension   . Pneumonia   . Schizophrenia, schizo-affective [facility])     Patient Active Problem List   Diagnosis Date Noted  . 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 Low grade fever   . Surgical wound dehiscence   . Opacity of lung on imaging study   . Acute blood loss anemia   . Fever   . SIRS (systemic inflammatory response syndrome) (HCC)   . Leukocytosis   . Status post below knee amputation of right lower extremity ([place]) 08/28/2015  . 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[place]) 08/23/2015  . Sepsis ([place]) 08/21/2015  . ARF (acute renal failure) ([place]) 08/21/2015   . Acute encephalopathy 08/21/2015  . Post-operative state   . Respiratory failure ([place])   . Surgery, elective   . Anxiety   . Paranoid schizophrenia ([place])   . Atherosclerosis of extremity with gangrene ([place])   . Hyperglycemia   . Preoperative cardiovascular examination 04/11/2015  . PVD (peripheral vascular disease) ([place]) 04/10/2015  . Absolute anemia   . Diabetic foot infection ([place]) 04/02/2015  . Right foot pain 04/02/2015  . Neutrophilic leukocytosis 02/58/5277  . Esophageal abnormality   . HCAP (healthcare-associated pneumonia)   . COPD exacerbation ([place])   . Dyspnea   . Respiratory distress   . Sepsis due to pneumonia ([place]) 02/14/2015  . Acute respiratory failure with hypoxia ([place]) 02/13/2015  . Diabetes mellitus with neurologic complication, without long-term current use of insulin ([place]) 07/20/2011  . Essential hypertension   . COPD (chronic obstructive pulmonary disease) ([place])     Past Surgical History:  Procedure Laterality Date  . AMPUTATION Right 04/23/2015   Procedure: Right Fifth Toe Amputation;  Surgeon: Conrad [place] Beach, MD;  Location: [facility];  Service: Vascular;  Laterality: Right;  . AMPUTATION Right 08/27/2015   Procedure: RIGHT BELOW KNEE AMPUTATION ;  Surgeon: Newt Minion, MD;  Location: [facility];  Service: Orthopedics;  Laterality: Right;  . AORTA - BILATERAL FEMORAL ARTERY BYPASS GRAFT Bilateral 04/23/2015  Procedure: AORTOBIFEMORAL BYPASS GRAFT;  Surgeon: Conrad Rye Brook, MD;  Location: [facility];  Service: Vascular;  Laterality: Bilateral;  . FEMORAL-FEMORAL BYPASS GRAFT Left 04/23/2015   Procedure:  Left Common Femoral Artery to Superficial Femoral Artery Bypass Graft with Vein;  Surgeon: Conrad Johnson Siding, MD;  Location: [facility];  Service: Vascular;  Laterality: Left;  . FEMORAL-TIBIAL BYPASS GRAFT Right 04/23/2015   Procedure: Right Common Femoral to Posterior Tibial Bypass with Composite Vein Graft. ;  Surgeon: Conrad Fruitvale, MD;  Location: [facility];  Service: Vascular;  Laterality:  Right;  . PERIPHERAL VASCULAR CATHETERIZATION N/A 04/07/2015   Procedure: Abdominal Aortogram;  Surgeon: Angelia Mould, MD;  Location: [facility];  Service: Cardiovascular;  Laterality: N/A;        Home Medications    Prior to Admission medications   Medication Sig Start Date End Date Taking? Authorizing Provider  acetaminophen (TYLENOL) 325 MG tablet Take 650 mg by mouth every 6 (six) hours as needed for mild pain, fever or headache.    Yes [provider]  aspirin EC 81 MG tablet Take 1 tablet (81 mg total) by mouth daily. 05/01/15  Yes Virgina Jock A, PA-C  clonazePAM (KLONOPIN) 1 MG tablet Take 1 mg by mouth 3 (three) times daily as needed for anxiety.   Yes [provider]  cloZAPine (CLOZARIL) 100 MG tablet Take 1-4 tablets (100-400 mg total) by mouth 3 (three) times daily. Take 1 tablet (100 mg) by mouth daily at 8am and 4pm, take 4 tablets (400 mg) at bedtime Patient taking differently: Take 200 mg by mouth 2 (two) times daily.  08/28/15  Yes Rai, Ripudeep K, MD  donepezil (ARICEPT) 5 MG tablet Take 5 mg by mouth at bedtime.   Yes [provider]  gabapentin (NEURONTIN) 300 MG capsule Take 300 mg by mouth 3 (three) times daily.   Yes [provider]  haloperidol (HALDOL) 5 MG tablet Take 1 tablet (5 mg total) by mouth at bedtime. Patient taking differently: Take 5 mg by mouth 2 (two) times daily.  08/28/15  Yes Rai, Ripudeep K, MD  lactulose (CHRONULAC) 10 GM/15ML solution Take 30 g by mouth 2 (two) times daily.   Yes [provider]  lisinopril (PRINIVIL,ZESTRIL) 5 MG tablet Take 5 mg by mouth daily.   Yes [provider]  metFORMIN (GLUCOPHAGE) 500 MG tablet Take 500 mg by mouth daily with breakfast.    Yes [provider]  metoprolol tartrate (LOPRESSOR) 25 MG tablet Take 25 mg by mouth 2 (two) times daily.   Yes [provider]  Multiple Vitamins-Minerals (CERTA-VITE PO) Take 1 tablet by mouth  daily.   Yes [provider]  pantoprazole (PROTONIX) 40 MG tablet Take 1 tablet (40 mg total) by mouth daily. 02/20/15  Yes Theodis Blaze, MD  senna-docusate (SENOKOT-S) 8.6-50 MG tablet Take 2 tablets by mouth at bedtime. 09/09/15  Yes Love, Ivan Anchors, PA-C  simvastatin (ZOCOR) 5 MG tablet Take 5 mg by mouth at bedtime.   Yes [provider]  trihexyphenidyl (ARTANE) 5 MG tablet Take 5 mg by mouth 2 (two) times daily.    Yes [provider]  hydrocerin (EUCERIN) CREA Apply 892 application topically 2 (two) times daily. Patient not taking: Reported on 08/09/2017 09/09/15   Love, Ivan Anchors, PA-C  ipratropium-albuterol (DUONEB) 0.5-2.5 (3) MG/3ML SOLN Take 3 mLs by nebulization 3 (three) times daily. Patient not taking: Reported on 08/09/2017 02/19/15   Mart Piggs  M, MD  mupirocin ointment (BACTROBAN) 2 % Apply topically daily. Patient not taking: Reported on 08/09/2017 09/09/15   Love, Ivan Anchors, PA-C  nicotine (NICODERM CQ - DOSED IN MG/24 HOURS) 21 mg/24hr patch Place 1 patch (21 mg total) onto the skin daily. Patient not taking: Reported on 08/09/2017 09/09/15   Love, Ivan Anchors, PA-C  ondansetron (ZOFRAN) 4 MG tablet Take 1 tablet (4 mg total) by mouth every 6 (six) hours. Patient not taking: Reported on 08/09/2017 03/28/16   Dorie Rank, MD  trihexyphenidyl (ARTANE) 5 MG tablet Take 1 tablet (5 mg total) by mouth 2 (two) times daily with a meal. Patient not taking: Reported on 08/09/2017 08/28/15   Mendel Corning, MD    Family History Family History  Problem Relation Age of Onset  . Mental illness Mother     Social History Social History   Tobacco Use  . Smoking status: Current Every Day Smoker    Packs/day: 1.50    Years: 50.00    Pack years: 75.00    Types: Cigarettes  . Smokeless tobacco: Never Used  Substance Use Topics  . Alcohol use: No    Alcohol/week: 0.0 oz  . Drug use: No     Allergies   Patient has no known allergies.   Review of  Systems Review of Systems  Constitutional: Negative for chills and fever.  Respiratory: Negative for shortness of breath.   Cardiovascular: Positive for chest pain.  Gastrointestinal: Negative for abdominal pain, nausea and vomiting.  Skin: Positive for wound.  All other systems reviewed and are negative.    Physical Exam Updated Vital Signs BP 114/67   Pulse (!) 57   Temp 98.6 F (37 C) (Oral)   Resp 20   SpO2 93%   Physical Exam  Constitutional: He appears well-developed and well-nourished. No distress.  HENT:  Head: Normocephalic and atraumatic.  Eyes: Conjunctivae are normal. Right eye exhibits no discharge. Left eye exhibits no discharge.  Neck: Normal range of motion. Neck supple. No JVD present. No tracheal deviation present.  Cardiovascular: Normal rate and regular rhythm.  Pulses:      Carotid pulses are 2+ on the right side, and 2+ on the left side.      Radial pulses are 2+ on the right side, and 2+ on the left side.       Dorsalis pedis pulses are 2+ on the right side, and 2+ on the left side.       Posterior tibial pulses are 2+ on the right side, and 2+ on the left side.  No lower extremity edema, status post right BKA.  Pulmonary/Chest: Effort normal and breath sounds normal. No accessory muscle usage. No respiratory distress.  Equal rise and fall of chest, no increased work of breathing.  Speaking in full sentences without difficulty.  Superficial scratches noted to the left anterior chest wall, no tenderness, no active bleeding.  No erythema or induration noted.  Abdominal: Soft. Bowel sounds are normal. There is no tenderness. There is no rebound and no guarding.  Large midline surgical scar noted, well-healed.  Murphy sign absent, Rovsing's absent, no CVA tenderness  Musculoskeletal: He exhibits no edema.       Left lower leg: He exhibits no tenderness and no edema.  Neurological: He is alert.  Skin: Skin is warm and dry. No erythema.  Psychiatric: His  affect is blunt. He expresses suicidal ideation. He expresses no homicidal ideation. He expresses no suicidal plans and no homicidal  plans.  Nursing note and vitals reviewed.    ED Treatments / Results  Labs (all labs ordered are listed, but only abnormal results are displayed) Labs Reviewed  BASIC METABOLIC PANEL - Abnormal; Notable for the following components:      Result Value   Chloride 100 (*)    Glucose, Bld 329 (*)    All other components within normal limits  CBC - Abnormal; Notable for the following components:   Hemoglobin 12.4 (*)    MCH 25.3 (*)    MCHC 29.2 (*)    All other components within normal limits  ACETAMINOPHEN LEVEL - Abnormal; Notable for the following components:   Acetaminophen (Tylenol), Serum <10 (*)    All other components within normal limits  ETHANOL  SALICYLATE LEVEL  RAPID URINE DRUG SCREEN, HOSP PERFORMED  I-STAT TROPONIN, ED  I-STAT TROPONIN, ED    EKG EKG Interpretation  Date/Time:  Tuesday August 09 2017 18:10:01 EDT Ventricular Rate:  89 PR Interval:  152 QRS Duration: 84 QT Interval:  366 QTC Calculation: 445 R Axis:   18 Text Interpretation:  Normal sinus rhythm Normal ECG No significant change since last tracing Confirmed by Wandra Arthurs [phone]) on 08/09/2017 9:40:43 PM   Radiology Dg Chest 2 View  Result Date: 08/09/2017 CLINICAL DATA:  Left-sided chest pain and congestion x1 day. EXAM: CHEST - 2 VIEW COMPARISON:  03/28/2016 FINDINGS: Heart size and mediastinal contours are stable. There is mild aortic atherosclerosis without aneurysmal dilatation. Chronic stable mild interstitial prominence without alveolar consolidation, CHF, effusion or pneumothorax. No acute osseous abnormality. IMPRESSION: No active cardiopulmonary disease.  Aortic atherosclerosis. Electronically Signed   By: Ashley Royalty M.D.   On: 08/09/2017 19:05    Procedures Procedures (including critical care time)  Medications Ordered in ED Medications  Tdap  (BOOSTRIX) injection 0.5 mL (has no administration in time range)     Initial Impression / Assessment and Plan / ED Course  I have reviewed the triage vital signs and the nursing notes.  Pertinent labs & imaging results that were available during my care of the patient were reviewed by me and considered in my medical decision making (see chart for details).    Patient with superficial scratches to the left side of the chest that were self-inflicted in an attempt to "remove a police bad" that the patient believed was adhered to his heart.  He is afebrile, vital signs are stable.  He is nontoxic in appearance.  Serial troponins are negative, EKG shows no acute changes from last tracing.  Chest x-ray shows no active cardiopulmonary disease.  I doubt acute cardiopulmonary abnormality including ACS/MI, PE, pneumonia, pericarditis, or myocarditis.  Remainder of lab work reviewed by me shows hyperglycemia and mild anemia but no acute abnormalities.  Tetanus was updated.  He endorses suicidal ideation, and auditory and visual hallucinations.  He is medically clear for TTS evaluation at this time.  Of note, patient is here voluntarily and may require IVC if he attempts to leave prior to assessment.   Final Clinical Impressions(s) / ED Diagnoses   Final diagnoses:  Atypical chest pain  Suicidal ideation    ED Discharge Orders    None       Renita Papa, PA-C 08/10/17 0035    Drenda Freeze, MD 08/10/17 [phone]

## 2017-08-09 NOTE — ED Notes (Signed)
Pt wanded by security. 

## 2017-08-09 NOTE — ED Notes (Signed)
Pt in room and alert Waiting for sitter

## 2017-08-09 NOTE — ED Notes (Signed)
Patient transported to X-ray 

## 2017-08-09 NOTE — ED Notes (Signed)
Changed pt into maroon scrubs. NT placed pt's belongings in locker

## 2017-08-09 NOTE — ED Triage Notes (Signed)
Pt arrives via EMS from [facility]. Complains of left sided cp. EMS reports that pt has scratch marks on left side of chest. Pt told EMS that he had to remove a "police badge" from his chest- pt reports he had it in there from when he followed the president. Pt has hx of schizophrenia.  BP 108/62, HR96

## 2017-08-09 NOTE — ED Provider Notes (Signed)
Patient placed in Quick Look pathway, seen and evaluated   Chief Complaint: chest pain  HPI:   Patient presents for L sided chest pain that began today. Patient is a poor historian. He states the pain began after he scratched himself in that area. Pain went away but comes back on its own. Reports feeling tired. Told triage nurse that he followed the president because he was in the police. History of schizophrenia. Denies SOB, prior MI, DVT, PE. Also endorses SI and auditory hallucinations.  ROS: chest pain  Physical Exam:   Gen: No distress  Neuro: Awake and Alert  Skin: Warm    Focused Exam: RRR, lungs CTAB. Slow to respond.   Initiation of care has begun. The patient has been counseled on the process, plan, and necessity for staying for the completion/evaluation, and the remainder of the medical screening examination    Delia Heady, PA-C 08/09/17 1821    Macarthur Critchley, MD 08/09/17 2354

## 2017-08-09 NOTE — ED Notes (Signed)
Pt informed RN that he has suicidal thoughts. He denies a plan at this time. States he hears voices and "sees things" at times.

## 2017-08-10 ENCOUNTER — Other Ambulatory Visit: Payer: Self-pay

## 2017-08-10 LAB — RAPID URINE DRUG SCREEN, HOSP PERFORMED
Amphetamines: NOT DETECTED
Benzodiazepines: NOT DETECTED
COCAINE: NOT DETECTED
Opiates: NOT DETECTED
TETRAHYDROCANNABINOL: NOT DETECTED

## 2017-08-10 LAB — CBG MONITORING, ED
GLUCOSE-CAPILLARY: 307 mg/dL — AB (ref 65–99)
Glucose-Capillary: 383 mg/dL — ABNORMAL HIGH (ref 65–99)
Glucose-Capillary: 350 mg/dL — ABNORMAL HIGH (ref 65–99)

## 2017-08-10 MED ORDER — SIMVASTATIN 5 MG PO TABS
5.0000 mg | ORAL_TABLET | Freq: Every day | ORAL | Status: DC
  Filled 2017-08-10: qty 1

## 2017-08-10 MED ORDER — ACETAMINOPHEN 325 MG PO TABS: 650.0000 mg | ORAL_TABLET | Freq: Four times a day (QID) | ORAL | Status: DC | PRN

## 2017-08-10 MED ORDER — ACETAMINOPHEN 325 MG PO TABS: 650.0000 mg | ORAL_TABLET | ORAL | Status: DC | PRN

## 2017-08-10 MED ORDER — HALOPERIDOL 1 MG PO TABS: 5.0000 mg | ORAL_TABLET | Freq: Two times a day (BID) | ORAL | Status: DC

## 2017-08-10 MED ORDER — SENNOSIDES-DOCUSATE SODIUM 8.6-50 MG PO TABS
2.0000 | ORAL_TABLET | Freq: Every day | ORAL | Status: DC
  Filled 2017-08-10: qty 2

## 2017-08-10 MED ORDER — DONEPEZIL HCL 5 MG PO TABS: 5.0000 mg | ORAL_TABLET | Freq: Every day | ORAL | Status: DC

## 2017-08-10 MED ORDER — ZOLPIDEM TARTRATE 5 MG PO TABS: 5.0000 mg | ORAL_TABLET | Freq: Every evening | ORAL | Status: DC | PRN

## 2017-08-10 MED ORDER — METOPROLOL TARTRATE 25 MG PO TABS
25.0000 mg | ORAL_TABLET | Freq: Two times a day (BID) | ORAL | Status: DC
  Administered 2017-08-10: 25 mg via ORAL
  Filled 2017-08-10: qty 1

## 2017-08-10 MED ORDER — PANTOPRAZOLE SODIUM 40 MG PO TBEC
40.0000 mg | DELAYED_RELEASE_TABLET | Freq: Every day | ORAL | Status: DC
  Administered 2017-08-10: 40 mg via ORAL
  Filled 2017-08-10: qty 1

## 2017-08-10 MED ORDER — ASPIRIN EC 81 MG PO TBEC
81.0000 mg | DELAYED_RELEASE_TABLET | Freq: Every day | ORAL | Status: DC
  Administered 2017-08-10: 81 mg via ORAL
  Filled 2017-08-10: qty 1

## 2017-08-10 MED ORDER — TRIHEXYPHENIDYL HCL 5 MG PO TABS
5.0000 mg | ORAL_TABLET | Freq: Two times a day (BID) | ORAL | Status: DC
  Administered 2017-08-10: 5 mg via ORAL
  Filled 2017-08-10 (×2): qty 1

## 2017-08-10 MED ORDER — CLOZAPINE 100 MG PO TABS
200.0000 mg | ORAL_TABLET | Freq: Two times a day (BID) | ORAL | Status: DC
  Administered 2017-08-10: 200 mg via ORAL
  Filled 2017-08-10: qty 2

## 2017-08-10 MED ORDER — ADULT MULTIVITAMIN LIQUID CH
5.0000 mL | Freq: Every day | ORAL | Status: DC
  Administered 2017-08-10: 5 mL via ORAL
  Filled 2017-08-10: qty 15

## 2017-08-10 MED ORDER — LACTULOSE 10 GM/15ML PO SOLN
30.0000 g | Freq: Two times a day (BID) | ORAL | Status: DC
  Administered 2017-08-10: 30 g via ORAL
  Filled 2017-08-10 (×2): qty 60

## 2017-08-10 MED ORDER — CLONAZEPAM 0.5 MG PO TABS: 1.0000 mg | ORAL_TABLET | Freq: Three times a day (TID) | ORAL | Status: DC | PRN

## 2017-08-10 MED ORDER — ALUM & MAG HYDROXIDE-SIMETH 200-200-20 MG/5ML PO SUSP: 30.0000 mL | Freq: Four times a day (QID) | ORAL | Status: DC | PRN

## 2017-08-10 MED ORDER — ONDANSETRON HCL 4 MG PO TABS: 4.0000 mg | ORAL_TABLET | Freq: Three times a day (TID) | ORAL | Status: DC | PRN

## 2017-08-10 MED ORDER — LISINOPRIL 2.5 MG PO TABS
5.0000 mg | ORAL_TABLET | Freq: Every day | ORAL | Status: DC
  Administered 2017-08-10: 5 mg via ORAL
  Filled 2017-08-10: qty 2

## 2017-08-10 MED ORDER — HALOPERIDOL 1 MG PO TABS
5.0000 mg | ORAL_TABLET | Freq: Two times a day (BID) | ORAL | Status: DC
  Filled 2017-08-10 (×2): qty 5

## 2017-08-10 MED ORDER — GABAPENTIN 300 MG PO CAPS
300.0000 mg | ORAL_CAPSULE | Freq: Three times a day (TID) | ORAL | Status: DC
  Administered 2017-08-10 (×2): 300 mg via ORAL
  Filled 2017-08-10 (×2): qty 1

## 2017-08-10 MED ORDER — HYDROXYZINE HCL 25 MG PO TABS: 25.0000 mg | ORAL_TABLET | Freq: Three times a day (TID) | ORAL | Status: DC | PRN

## 2017-08-10 MED ORDER — HALOPERIDOL 5 MG PO TABS
5.0000 mg | ORAL_TABLET | Freq: Two times a day (BID) | ORAL | Status: DC
  Administered 2017-08-10: 5 mg via ORAL
  Filled 2017-08-10 (×3): qty 1

## 2017-08-10 MED ORDER — METFORMIN HCL 500 MG PO TABS
500.0000 mg | ORAL_TABLET | Freq: Every day | ORAL | Status: DC
  Administered 2017-08-10: 500 mg via ORAL
  Filled 2017-08-10: qty 1

## 2017-08-10 NOTE — ED Notes (Signed)
TTS at bedside. 

## 2017-08-10 NOTE — Progress Notes (Signed)
Pt. meets criteria for geriatric inpatient treatment per Atkins, Lindon Romp, NP.  Referred out to the following hospitals:  [facility]  [facility]  [facility]       Disposition CSW will continue to follow for placement.  Areatha Keas. Judi Cong, MSW, [place] Disposition Clinical Social Work [phone] (cell) [phone] (office)

## 2017-08-10 NOTE — Progress Notes (Addendum)
Per Waylan Boga, NP, Pt has been psychiatrically cleared. CSW spoke with Hayley @[facility] , who is aware and who has contacted the [facility] center where pt resides to request staff come pick him up from the ED.   Audree Camel, LCSW, [place] Disposition CSW [facility]/TTS [phone]  7:25: Pt is being taken home by non-emergency ambulance services. CSW notified pt's ALF ([facility]) manger, [place].

## 2017-08-10 NOTE — Progress Notes (Signed)
62 yo male who presented to the ED with chest pain and suicidal ideations.  His medications were restarted and today, he denies suicidal/homicidal ideations, hallucinations, and substance abuse.  Agreeable to return to his facility.  Calm and cooperative, watching television.  Stable to return to his facility.  Waylan Boga, PMHNP

## 2017-08-10 NOTE — [facility] Suicide Risk Assessment (Signed)
Suicide Risk Assessment  Discharge Assessment   [facility] Discharge Suicide Risk Assessment   Principal Problem: Adjustment disorder with mixed disturbance of emotions and conduct Discharge Diagnoses:  Patient Active Problem List   Diagnosis Date Noted  . Adjustment disorder with mixed disturbance of emotions and conduct [F43.25] 04/27/2016    Priority: High  . Low grade fever [R50.9]   . Surgical wound dehiscence [T81.31XA]   . Opacity of lung on imaging study [R91.8]   . Acute blood loss anemia [D62]   . Fever [R50.9]   . SIRS (systemic inflammatory response syndrome) (HCC) [R65.10]   . Leukocytosis [D72.829]   . Status post below knee amputation of right lower extremity ([place]) [Z89.511] 08/28/2015  . 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[place]) [E11.69, M86.9] 08/23/2015  . Sepsis ([place]) [A41.9] 08/21/2015  . ARF (acute renal failure) ([place]) [N17.9] 08/21/2015  . Acute encephalopathy [G93.40] 08/21/2015  . Post-operative state [Z98.890]   . Respiratory failure ([place]) [J96.90]   . Surgery, elective [Z41.9]   . Anxiety [F41.9]   . Paranoid schizophrenia ([place]) [F20.0]   . Atherosclerosis of extremity with gangrene ([place]) [I70.269]   . Hyperglycemia [R73.9]   . Preoperative cardiovascular examination [Z01.810] 04/11/2015  . PVD (peripheral vascular disease) ([place]) [I73.9] 04/10/2015  . Absolute anemia [D64.9]   . Diabetic foot infection ([place]) [Z16.967, L08.9] 04/02/2015  . Right foot pain [M79.671] 04/02/2015  . Neutrophilic leukocytosis [E93.8] 04/02/2015  . Esophageal abnormality [K22.9]   . HCAP (healthcare-associated pneumonia) [J18.9]   . COPD exacerbation ([place]) [J44.1]   . Dyspnea [R06.00]   . Respiratory distress [R06.03]   . Sepsis due to pneumonia ([place]) [J18.9, A41.9] 02/14/2015  . Acute respiratory failure with hypoxia ([place]) [J96.00] 02/13/2015  . Diabetes mellitus with neurologic complication, without long-term current use of insulin ([place]) [E11.49] 07/20/2011  . Essential hypertension [I10]    . COPD (chronic obstructive pulmonary disease) ([place]) [J44.9]     Total Time spent with patient: 45 minutes  Musculoskeletal: Strength & Muscle Tone: within normal limits Gait & Station: normal Patient leans: N/A  Psychiatric Specialty Exam:   Blood pressure (!) 114/55, pulse 80, temperature 98.6 F (37 C), temperature source Oral, resp. rate 15, SpO2 93 %.There is no height or weight on file to calculate BMI.  General Appearance: Casual  Eye Contact::  Good  Speech:  Normal Rate409  Volume:  Normal  Mood:  Euthymic  Affect:  Congruent  Thought Process:  Coherent and Descriptions of Associations: Intact  Orientation:  Full (Time, Place, and Person)  Thought Content:  WDL and Logical  Suicidal Thoughts:  No  Homicidal Thoughts:  No  Memory:  Immediate;   Fair Recent;   Fair Remote;   Fair  Judgement:  Fair  Insight:  Fair  Psychomotor Activity:  Normal  Concentration:  Good  Recall:  [place] of Knowledge:Fair  Language: Good  Akathisia:  No  Handed:  Right  AIMS (if indicated):     Assets:  Housing Leisure Time Resilience Social Support  Sleep:     Cognition: WNL  ADL's:  Intact   Mental Status Per Nursing Assessment::   On Admission:   62 yo male who presented to the ED with chest pain and suicidal ideations.  His medications were restarted and today, he denies suicidal/homicidal ideations, hallucinations, and substance abuse.  Agreeable to return to his facility.  Calm and cooperative, watching television.  Stable to return to his facility.  Demographic Factors:  Male  Loss Factors: NA  Historical  Factors: NA  Risk Reduction Factors:   Sense of responsibility to family, Positive social support and Positive therapeutic relationship  Continued Clinical Symptoms:  None  Cognitive Features That Contribute To Risk:  None    Suicide Risk:  Minimal: No identifiable suicidal ideation.  Patients presenting with no risk factors but with morbid  ruminations; may be classified as minimal risk based on the severity of the depressive symptoms    Plan Of Care/Follow-up recommendations:  Activity:  as tolerated Diet:  heart healthy diet  LORD, JAMISON, NP 08/10/2017, 4:45 PM

## 2017-08-10 NOTE — ED Notes (Signed)
TTS consult completed

## 2017-08-10 NOTE — Progress Notes (Addendum)
Shirlee Limerick, [facility]  BH called and requested copy of pt's EKG and an update of his CBG level.  Called and spoke with [facility] ED Nurse and asked for a new CBG level,  CSW will continue to follow for placement.  Areatha Keas. Judi Cong, MSW, [place] Disposition Clinical Social Work [phone] (cell) [phone] (office)'  Faxed EKG results and new CBG (level increased to 383 which will probably prohibit him from being picked up by [facility].  Areatha Keas. Judi Cong, MSW, [place] Disposition Clinical Social Work [phone] (cell) [phone] (office)

## 2017-08-10 NOTE — ED Notes (Signed)
Left voicemail with [facility] to call back to inquire about patient's residence.

## 2017-08-10 NOTE — BH Assessment (Signed)
Tele Assessment Note   Patient Name: Teshaun Olarte MRN: [id number] Referring Physician: Drenda Freeze, MD Location of Patient: [facility] Location of Provider: [place]  Ryan Ogborn is an 62 y.o. male who was brought to [facility] via EMS after complaining of "chest pains" to his facility ([place]).  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  I want to work for [employer].  I like President Eulas [employer]."  [facility] asked pt if he heard voices?  Pt responded "I hear voices all the time in my head other people can't hear.  I been in and out of insane asylum my whole life."  Pt denies HI but admits having suicidal thoughts.  Pt states "yeah I want to hurt myself but I ain't got nothing to do it with."  Pt denies SA.  Patient was wearing scrubs and appeared appropriately groomed.  Pt was alert throughout the assessment.  Patient made fair eye contact and had abnormal psychomotor activity.  Patient spoke in a mummled voice without pressured speech.  Pt expressed feeling depressed.  Pt's affect appeared euthymic/normal mood and incongruent with stated mood. Pt's thought process was circumstantial and tangential .  Pt presented with partial insight and judgement.  Pt did not appear to be responding to internal stimuli.  Disposition: Case discuss with [facility] provider, Lindon Romp, NP who recommends gero-psych inpatient treatment for the patient.  TTS will be looking for placement.  [facility] informed ED provider, Dr Bland Span and pt's nurse, Wilhelmenia Blase, RN of the recommended disposition.   Diagnosis: Schizophrenia,   Past Medical History:  Past Medical History:  Diagnosis Date  . Anxiety   . Constipation   . COPD (chronic obstructive pulmonary disease) ([place])   . Diabetes mellitus   . Fecal impaction ([place])   . Hypertension   . Pneumonia   . Schizophrenia,  schizo-affective ([place])     Past Surgical History:  Procedure Laterality Date  . AMPUTATION Right 04/23/2015   Procedure: Right Fifth Toe Amputation;  Surgeon: Conrad Branch, MD;  Location: [place];  Service: Vascular;  Laterality: Right;  . AMPUTATION Right 08/27/2015   Procedure: RIGHT BELOW KNEE AMPUTATION ;  Surgeon: Newt Minion, MD;  Location: [place];  Service: Orthopedics;  Laterality: Right;  . AORTA - BILATERAL FEMORAL ARTERY BYPASS GRAFT Bilateral 04/23/2015   Procedure: AORTOBIFEMORAL BYPASS GRAFT;  Surgeon: Conrad Christine, MD;  Location: [place];  Service: Vascular;  Laterality: Bilateral;  . FEMORAL-FEMORAL BYPASS GRAFT Left 04/23/2015   Procedure:  Left Common Femoral Artery to Superficial Femoral Artery Bypass Graft with Vein;  Surgeon: Conrad Hudson, MD;  Location: [place];  Service: Vascular;  Laterality: Left;  . FEMORAL-TIBIAL BYPASS GRAFT Right 04/23/2015   Procedure: Right Common Femoral to Posterior Tibial Bypass with Composite Vein Graft. ;  Surgeon: Conrad Faith, MD;  Location: [place];  Service: Vascular;  Laterality: Right;  . PERIPHERAL VASCULAR CATHETERIZATION N/A 04/07/2015   Procedure: Abdominal Aortogram;  Surgeon: Angelia Mould, MD;  Location: [place] CV [facility];  Service: Cardiovascular;  Laterality: N/A;    Family History:  Family History  Problem Relation Age of Onset  . Mental illness Mother     Social History:  reports that he has been smoking cigarettes.  He has a 75.00 pack-year smoking history. He has never used smokeless tobacco. He reports that  he does not drink alcohol or use drugs.  Additional Social History:  Alcohol / Drug Use Pain Medications: See MARs Prescriptions: See MARs Over the Counter: See MARs History of alcohol / drug use?: Yes(pt reports doing drugs 40 yrs ago) Longest period of sobriety (when/how long): 40 yrs per patient  CIWA: CIWA-Ar BP: 114/67 Pulse Rate: (!) 57 COWS:    Allergies: No Known Allergies  Home Medications:  (Not in  a hospital admission)  OB/GYN Status:  No LMP for male patient.  General Assessment Data Location of Assessment: [facility] ED TTS Assessment: In system Is this a Tele or Face-to-Face Assessment?: Tele Assessment Is this an Initial Assessment or a Re-assessment for this encounter?: Initial Assessment Marital status: Single Is patient pregnant?: No Pregnancy Status: No Living Arrangements: Other (Comment)(Retirement community) Can pt return to current living arrangement?: Yes Admission Status: Voluntary Is patient capable of signing voluntary admission?: No Referral Source: Self/Family/Friend Insurance type: [place] Medicaid     Crisis Care Plan Living Arrangements: Other (Comment)(Retirement community) Legal Guardian: Other relative(Pt reports his sister Kenney Houseman is his guardian) Name of Psychiatrist: unknown by pt Name of Therapist: unknown by pt  Education Status Is patient currently in school?: No Is the patient employed, unemployed or receiving disability?: Receiving disability income  Risk to self with the past 6 months Suicidal Ideation: Yes-Currently Present Has patient been a risk to self within the past 6 months prior to admission? : Yes Suicidal Intent: No Has patient had any suicidal intent within the past 6 months prior to admission? : No Is patient at risk for suicide?: No Suicidal Plan?: No Has patient had any suicidal plan within the past 6 months prior to admission? : No Access to Means: No Previous Attempts/Gestures: No Triggers for Past Attempts: Unknown Intentional Self Injurious Behavior: None(pt was not able to articulate) Family Suicide History: Unknown Persecutory voices/beliefs?: Yes Depression: Yes Depression Symptoms: Feeling worthless/self pity, Feeling angry/irritable Substance abuse history and/or treatment for substance abuse?: No Suicide prevention information given to non-admitted patients: Not applicable  Risk to Others within the past 6  months Homicidal Ideation: No Does patient have any lifetime risk of violence toward others beyond the six months prior to admission? : No Thoughts of Harm to Others: No Current Homicidal Intent: No Current Homicidal Plan: No Access to Homicidal Means: No History of harm to others?: No Assessment of Violence: None Noted Does patient have access to weapons?: No Criminal Charges Pending?: No Does patient have a court date: No Is patient on probation?: No  Psychosis Hallucinations: Visual, Auditory Delusions: Persecutory, Grandiose  Mental Status Report Appearance/Hygiene: In scrubs Eye Contact: Fair Motor Activity: Unremarkable Speech: Slurred Level of Consciousness: Alert Affect: Depressed Anxiety Level: None Thought Processes: Circumstantial Judgement: Partial Orientation: Unable to assess, Person Obsessive Compulsive Thoughts/Behaviors: Minimal  Cognitive Functioning Concentration: Unable to Assess Memory: Unable to Assess Is patient DD?: Unknown Insight: Poor Impulse Control: Poor Have you had any weight changes? : Loss Sleep: Unable to Assess Vegetative Symptoms: Unable to Assess  ADLScreening [facility] Assessment Services) Patient's cognitive ability adequate to safely complete daily activities?: Yes Patient able to express need for assistance with ADLs?: No Independently performs ADLs?: No  Prior Inpatient Therapy Prior Inpatient Therapy: Yes Prior Therapy Dates: multiple Prior Therapy Facilty/Provider(s): multiple  Prior Outpatient Therapy Prior Outpatient Therapy: No Does patient have an ACCT team?: Unknown Does patient have Intensive In-House Services?  : Unknown Does patient have [facility] services? : Unknown Does patient have P4CC services?: Unknown  ADL Screening (condition at time of admission) Patient's cognitive ability adequate to safely complete daily activities?: Yes Is the patient deaf or have difficulty hearing?: No Does the patient have  difficulty seeing, even when wearing glasses/contacts?: No Does the patient have difficulty concentrating, remembering, or making decisions?: Yes Patient able to express need for assistance with ADLs?: No Does the patient have difficulty dressing or bathing?: Yes Independently performs ADLs?: No Communication: Independent Dressing (OT): Needs assistance Is this a change from baseline?: Pre-admission baseline Grooming: Needs assistance Is this a change from baseline?: Pre-admission baseline Feeding: Independent Bathing: Dependent Is this a change from baseline?: Pre-admission baseline Toileting: Dependent Is this a change from baseline?: Pre-admission baseline In/Out Bed: Dependent Is this a change from baseline?: Pre-admission baseline Walks in Home: Needs assistance Is this a change from baseline?: Pre-admission baseline Does the patient have difficulty walking or climbing stairs?: Yes Weakness of Legs: Both Weakness of Arms/Hands: Both       Abuse/Neglect Assessment (Assessment to be complete while patient is alone) Abuse/Neglect Assessment Can Be Completed: Yes Physical Abuse: Denies Verbal Abuse: Denies Sexual Abuse: Denies Exploitation of patient/patient's resources: Denies Self-Neglect: Denies Values / Beliefs Cultural Requests During Hospitalization: None Spiritual Requests During Hospitalization: None Consults Spiritual Care Consult Needed: No Social Work Consult Needed: No Regulatory affairs officer (For Healthcare) Does Patient Have a Medical Advance Directive?: Yes Does patient want to make changes to medical advance directive?: No - Patient declined Copy of [place] in Chart?: No - copy requested          Disposition: Case discuss with [facility] provider, Lindon Romp, NP who recommends gero-psych inpatient treatment for the patient.  TTS will be looking for placement.  [facility] informed ED provider, Dr Bland Span and pt's nurse, Wilhelmenia Blase, RN of the recommended  disposition.  Disposition Initial Assessment Completed for this Encounter: Yes  This service was provided via telemedicine using a 2-way, interactive audio and video technology.  Names of all persons participating in this telemedicine service and their role in this encounter. Name: Janora Norlander Role: Patient  Name: Sylvester Harder, MS, LPC Role: Therapist  Name: Lindon Romp, NP Role: [facility] Provider  Name:  Role:     [place] 08/10/2017 2:33 AM

## 2017-08-10 NOTE — ED Notes (Signed)
Pt alert watching TV, sitter @ bedside

## 2017-08-10 NOTE — ED Notes (Signed)
edp aware of blood sugar. Plan is to continue metformin as scheduled

## 2017-08-10 NOTE — Progress Notes (Signed)
Inpatient Diabetes Program Recommendations  AACE/ADA: New Consensus Statement on Inpatient Glycemic Control (2015)  Target Ranges:  Prepandial:   less than 140 mg/dL      Peak postprandial:   less than 180 mg/dL (1-2 hours)      Critically ill patients:  140 - 180 mg/dL   Lab Results  Component Value Date   GLUCAP 383 (H) 08/10/2017   HGBA1C 6.3 (H) 04/28/2015    Review of Glycemic Control  Diabetes history: DM2 Outpatient Diabetes medications: metformin 500 mg QAM Current orders for Inpatient glycemic control: metformin 500 mg QAM Last HgbA1C on 04/27/2017 - 6.3%. Needs updating.  Inpatient Diabetes Program Recommendations:     Add Novolog 0-9 units tidwc Need updated HgbA1C. CHO mod med diet.  Will follow.  Thank you. Lorenda Peck, RD, LDN, CDE Inpatient Diabetes Coordinator [phone]

## 2017-08-10 NOTE — ED Notes (Signed)
EDP aware of blood sugar readings. EDP advising to continue to monitor and administer metformin as scheduled

## 2017-08-31 IMAGING — MR MR FOOT*R* WO/W CM
5 of 9 series · 19 of 40 positions shown · IV contrast (multihance)
Comparison: None.

CLINICAL DATA: Diabetic foot infection.  Fifth digit is black.

EXAM:
MRI OF THE RIGHT FOREFOOT WITHOUT AND WITH CONTRAST
TECHNIQUE: Multiplanar, multisequence MR imaging was performed both before and
after administration of intravenous contrast.
CONTRAST:  16mL MULTIHANCE GADOBENATE DIMEGLUMINE 529 MG/ML IV SOLN

[Series 3: T1 · coronal · 4.0mm · 0.29mm/px · 5 of 30 slices shown (1 of 2)]
[im 1/30]
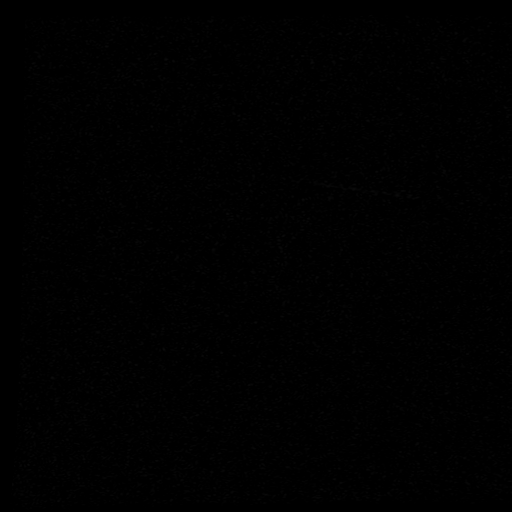
[im 8/30]
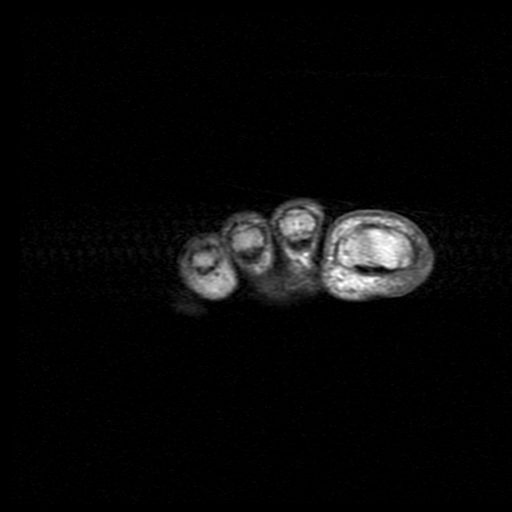
[im 15/30]
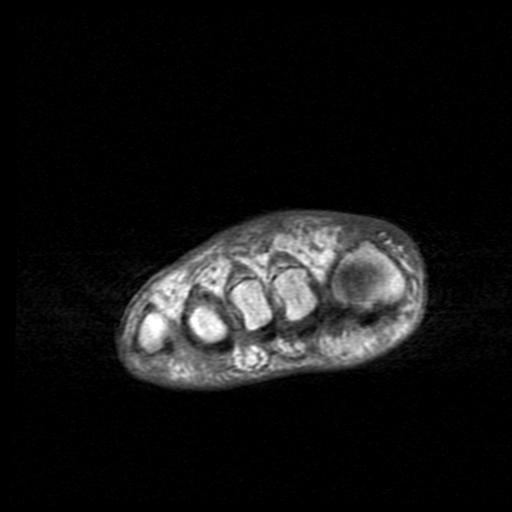
[im 22/30]
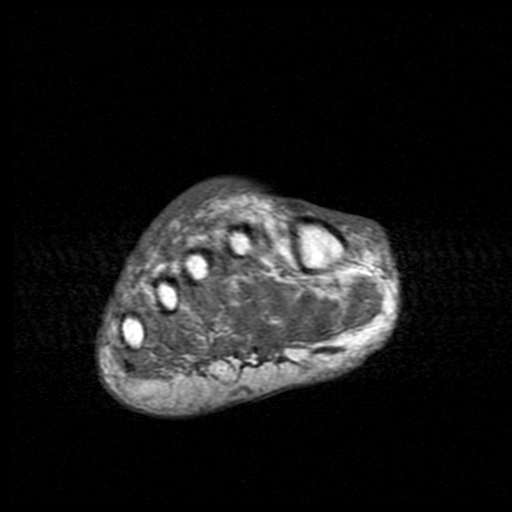
[im 30/30]
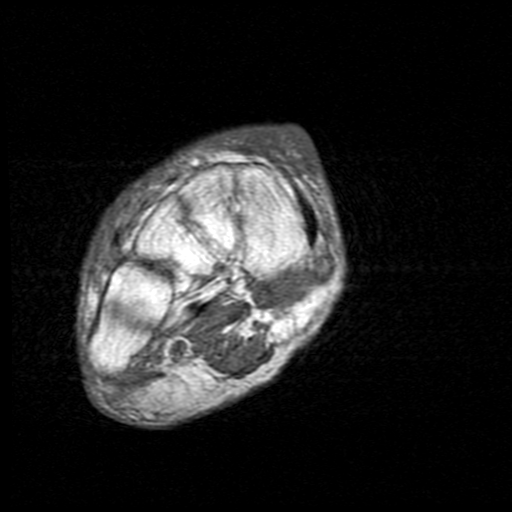

[Series 6: T1 · axial · 4.0mm · 0.31mm/px · z∈[-117,-41]mm · 3 of 18 slices shown (2 of 2)]
[im 1/18]
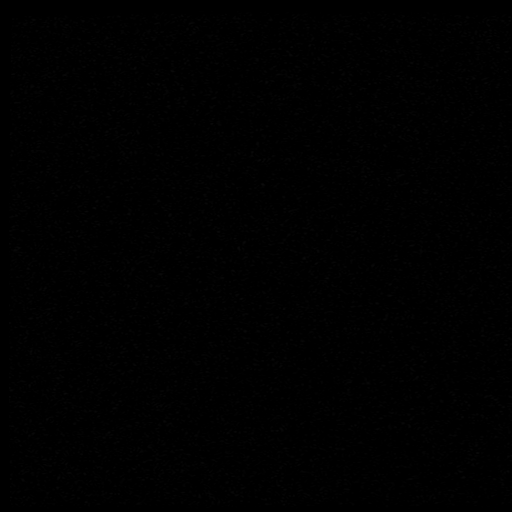
[im 9/18]
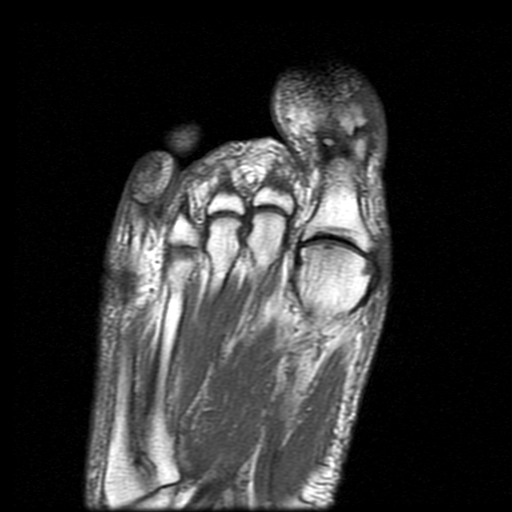
[im 18/18]
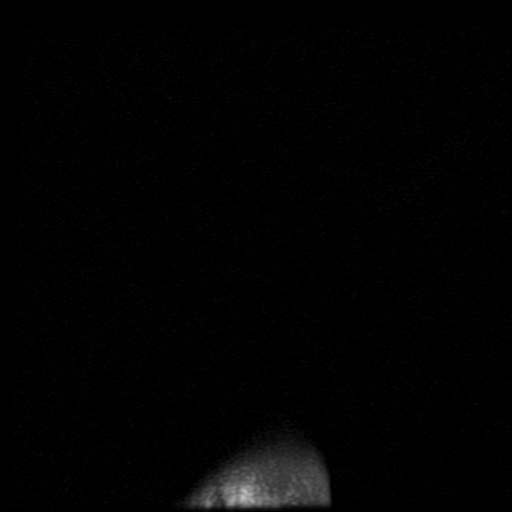

[Series 9: T1 fat-sat post-contrast · coronal · 4.0mm · 0.29mm/px · 6 of 30 slices shown]
[im 1/30]
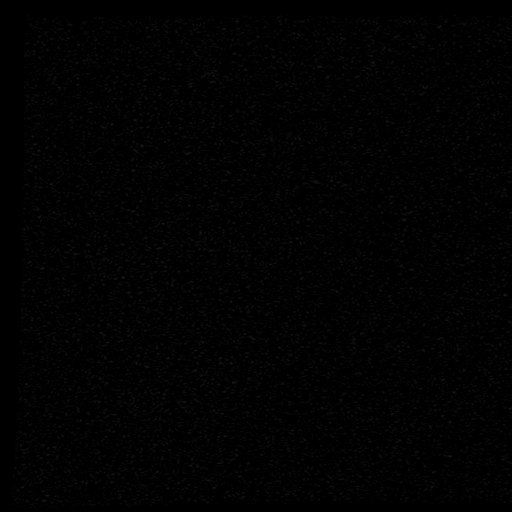
[im 6/30]
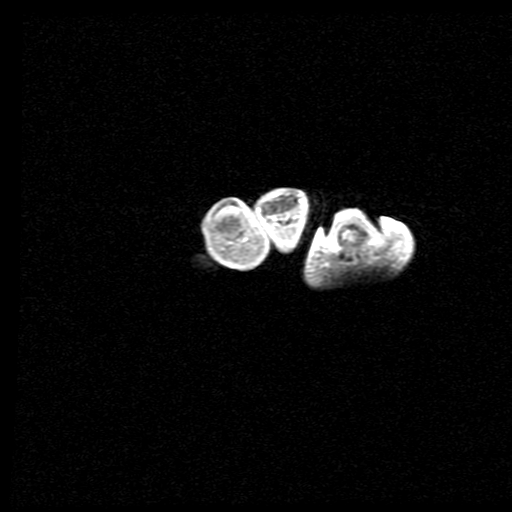
[im 12/30]
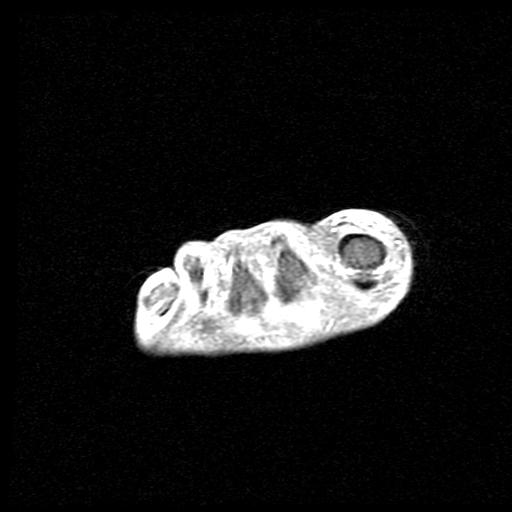
[im 18/30]
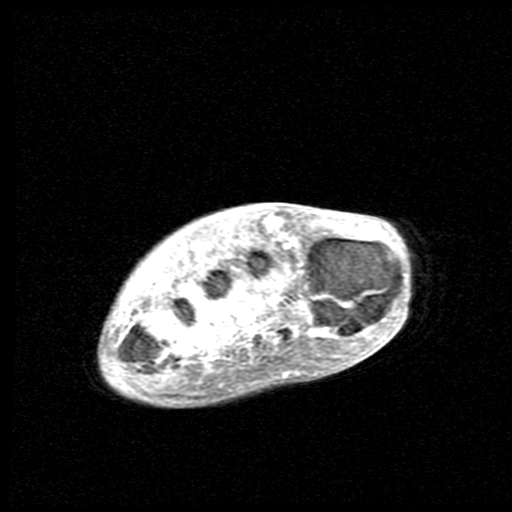
[im 24/30]
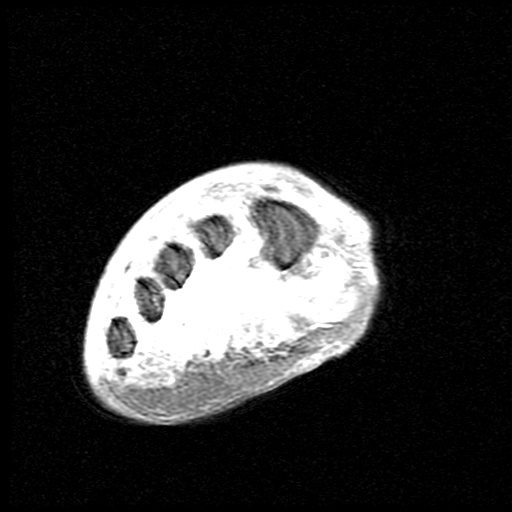
[im 30/30]
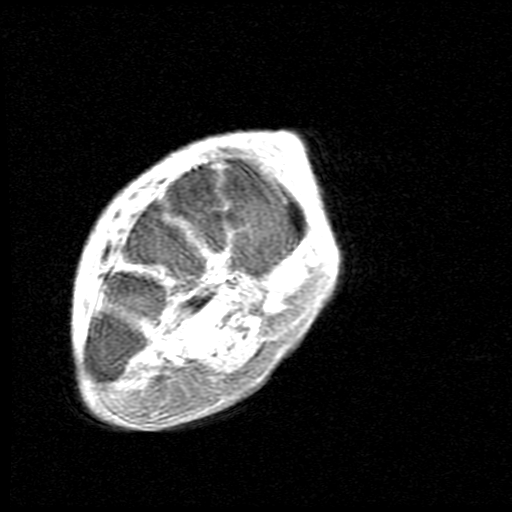

[Series 10: T1 post-contrast · axial · 4.0mm · 0.31mm/px · z∈[-117,-41]mm · 3 of 18 slices shown (1 of 2)]
[im 1/18]
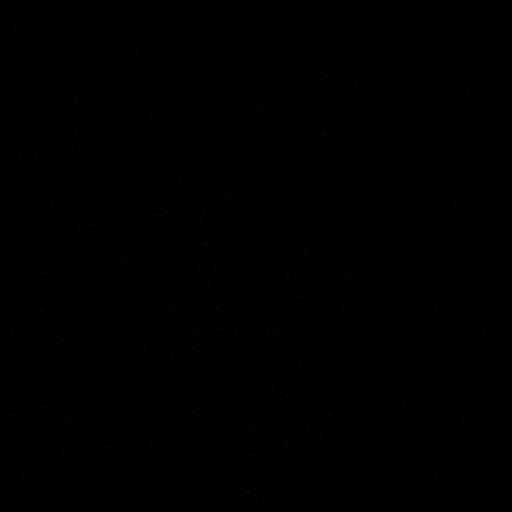
[im 9/18]
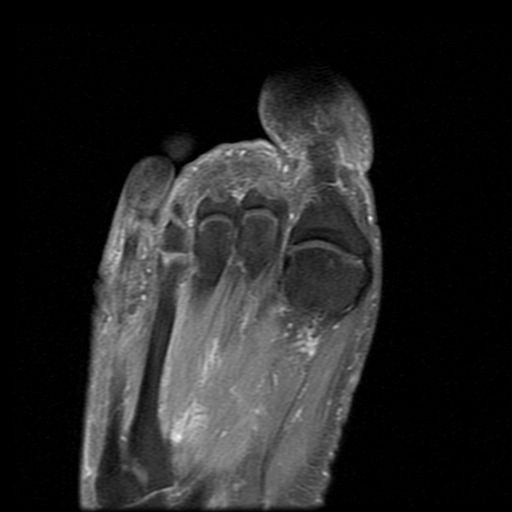
[im 18/18]
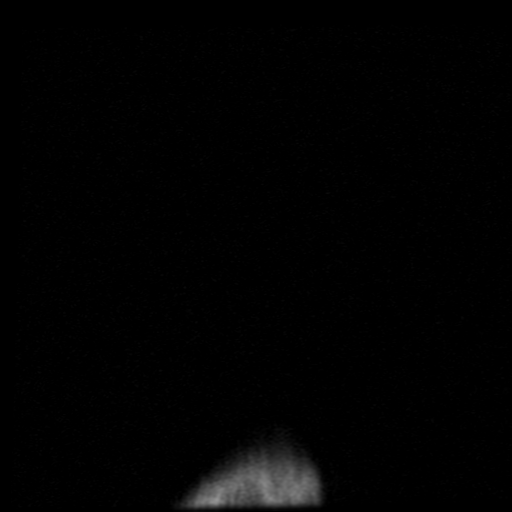

[Series 11: T1 post-contrast · sagittal · 4.0mm · 0.29mm/px · 2 of 21 slices shown (2 of 2)]
[im 1/21]
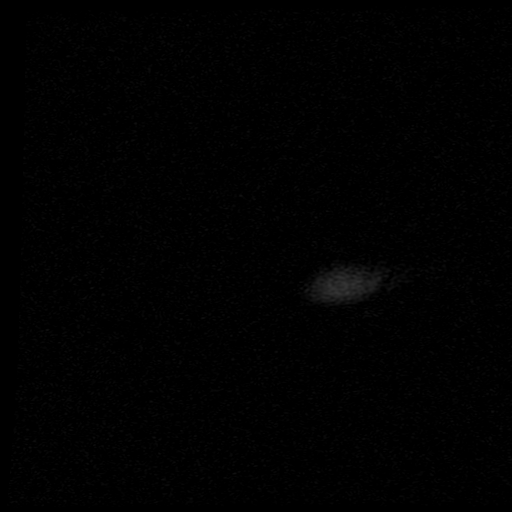
[im 7/21]
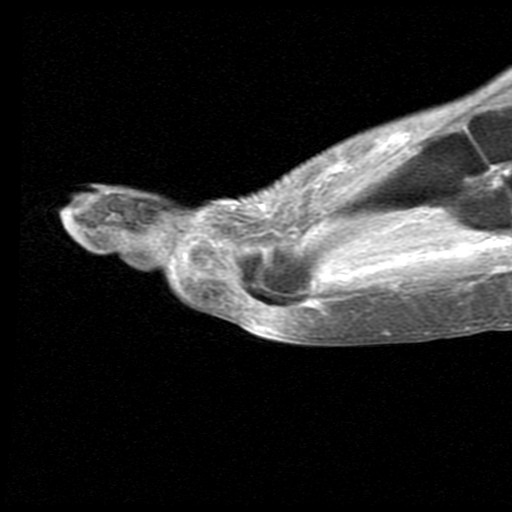

[19 of 40 positions shown; findings below may reference images not displayed]

FINDINGS: Minimal marrow edema in the first distal phalanx without cortical
destruction.

No other marrow signal abnormality. No acute fracture dislocation.
No bone destruction or periosteal reaction.

Mild osteoarthritis of the first MTP joint.

There is no soft tissue fluid collection or hematoma. There is mild
generalized soft tissue edema along the dorsal aspect of the midfoot
with mild enhancement which may reflect reactive edema versus mild
cellulitis. The muscles are normal. The visualized flexor and
extensor tendons are intact.
IMPRESSION: 1. No evidence of osteomyelitis of the right foot.

## 2018-01-27 ENCOUNTER — Emergency Department (HOSPITAL_COMMUNITY): Payer: Medicaid Other

## 2018-01-27 ENCOUNTER — Other Ambulatory Visit: Payer: Self-pay

## 2018-01-27 ENCOUNTER — Encounter (HOSPITAL_COMMUNITY): Payer: Self-pay | Admitting: Radiology

## 2018-01-27 ENCOUNTER — Emergency Department (HOSPITAL_COMMUNITY)
Admission: EM | Admit: 2018-01-27 | Discharge: 2018-01-27 | Disposition: A | Discharge status: Home / Self Care | Payer: Medicaid Other | Attending: Emergency Medicine | Admitting: Emergency Medicine

## 2018-01-27 DIAGNOSIS — Z7982 Long term (current) use of aspirin: Secondary | ICD-10-CM | POA: Diagnosis not present

## 2018-01-27 DIAGNOSIS — J449 Chronic obstructive pulmonary disease, unspecified: Secondary | ICD-10-CM | POA: Diagnosis not present

## 2018-01-27 DIAGNOSIS — F259 Schizoaffective disorder, unspecified: Secondary | ICD-10-CM | POA: Insufficient documentation

## 2018-01-27 DIAGNOSIS — Z7984 Long term (current) use of oral hypoglycemic drugs: Secondary | ICD-10-CM | POA: Diagnosis not present

## 2018-01-27 DIAGNOSIS — F250 Schizoaffective disorder, bipolar type: Secondary | ICD-10-CM

## 2018-01-27 DIAGNOSIS — F17210 Nicotine dependence, cigarettes, uncomplicated: Secondary | ICD-10-CM | POA: Insufficient documentation

## 2018-01-27 DIAGNOSIS — R4182 Altered mental status, unspecified: Secondary | ICD-10-CM | POA: Diagnosis present

## 2018-01-27 DIAGNOSIS — E119 Type 2 diabetes mellitus without complications: Secondary | ICD-10-CM | POA: Diagnosis not present

## 2018-01-27 LAB — URINALYSIS, ROUTINE W REFLEX MICROSCOPIC
Bilirubin Urine: NEGATIVE
Glucose, UA: NEGATIVE mg/dL
Hgb urine dipstick: NEGATIVE
Ketones, ur: NEGATIVE mg/dL
Leukocytes, UA: NEGATIVE
Nitrite: NEGATIVE
PROTEIN: NEGATIVE mg/dL
SPECIFIC GRAVITY, URINE: 1.012 (ref 1.005–1.030)
pH: 5 (ref 5.0–8.0)

## 2018-01-27 LAB — CBC
HEMATOCRIT: 38 % — AB (ref 39.0–52.0)
HEMOGLOBIN: 11.1 g/dL — AB (ref 13.0–17.0)
MCH: 25.5 pg — AB (ref 26.0–34.0)
MCHC: 29.2 g/dL — AB (ref 30.0–36.0)
MCV: 87.4 fL (ref 80.0–100.0)
Platelets: 348 10*3/uL (ref 150–400)
RBC: 4.35 MIL/uL (ref 4.22–5.81)
RDW: 14.4 % (ref 11.5–15.5)
WBC: 10.7 10*3/uL — AB (ref 4.0–10.5)
nRBC: 0.2 % (ref 0.0–0.2)

## 2018-01-27 LAB — COMPREHENSIVE METABOLIC PANEL
ALBUMIN: 3.8 g/dL (ref 3.5–5.0)
ALT: 17 U/L (ref 0–44)
ANION GAP: 12 (ref 5–15)
AST: 18 U/L (ref 15–41)
Alkaline Phosphatase: 77 U/L (ref 38–126)
BILIRUBIN TOTAL: 0.5 mg/dL (ref 0.3–1.2)
BUN: 11 mg/dL (ref 8–23)
CHLORIDE: 102 mmol/L (ref 98–111)
CO2: 27 mmol/L (ref 22–32)
Calcium: 9.3 mg/dL (ref 8.9–10.3)
Creatinine, Ser: 0.6 mg/dL — ABNORMAL LOW (ref 0.61–1.24)
GFR calc Af Amer: >60 mL/min (ref >60)
GFR calc non Af Amer: >60 mL/min (ref >60)
GLUCOSE: 203 mg/dL — AB (ref 70–99)
POTASSIUM: 4 mmol/L (ref 3.5–5.1)
Sodium: 141 mmol/L (ref 135–145)
TOTAL PROTEIN: 7.5 g/dL (ref 6.5–8.1)

## 2018-01-27 LAB — CBG MONITORING, ED: GLUCOSE-CAPILLARY: 204 mg/dL — AB (ref 70–99)

## 2018-01-27 NOTE — BH Assessment (Signed)
[facility] Assessment Progress Note Case was staffed with Romilda Garret FNP who psychiatrically cleared patient. Social work will arrange transport back to facility via [facility] due to facilty transport Lucianne Lei has gone for the day, once medical clearance is complete.
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
## 2018-01-27 NOTE — Progress Notes (Addendum)
Patient ID: Berwyn Bigley, male   DOB: 01-16-1956, 62 y.o.   MRN: [id number]  Pt was seen and case discussed with treatment team. Pt was sent here from [facility] via [employer]. TTS contacted the facility and was told that [employer] told them the patient needed to be taken to the emergency room for evaluation. Pt is calm and cooperative and is not stating suicidal or homicidal ideations. He has not been aggressive in the [facility]. He did hand the TTS counselor a blank address change form and stated he wants to go to [place] to live. Pt is psychiatrically clear. Social work will arrange transport back to facility via [place] due to facilty transport Lucianne Lei has gone for the day,  once medical clearance is complete.   Ethelene Hal, NP-C 01-27-2018      1517  Patient's chart reviewed and case discussed with the physician extender and developed treatment plan. Reviewed the information documented and agree with the treatment plan.  Buford Dresser, DO 01/29/18 8:38 PM

## 2018-01-27 NOTE — ED Notes (Signed)
Bed: KS08 Expected date:  Expected time:  Means of arrival:  Comments: EMS-[facility]

## 2018-01-27 NOTE — Discharge Instructions (Addendum)
Please return for any problem.  Follow-up with your regular care provider as instructed. °

## 2018-01-27 NOTE — BH Assessment (Signed)
[facility] Assessment Progress Note  Per Jinny Blossom, FNP, this pt does not require psychiatric hospitalization at this time.  Pt is psychiatrically cleared at this time.  No referrals are indicated for behavioral health follow up.  Pt's nurse has been notified.  Jalene Mullet, [place] Triage Specialist [phone]

## 2018-01-27 NOTE — ED Notes (Signed)
PTAR notified of pt need for transport.
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
## 2018-06-16 ENCOUNTER — Observation Stay (HOSPITAL_COMMUNITY): Payer: Medicaid Other

## 2018-06-16 ENCOUNTER — Emergency Department (HOSPITAL_COMMUNITY): Payer: Medicaid Other

## 2018-06-16 ENCOUNTER — Encounter (HOSPITAL_COMMUNITY): Payer: Self-pay | Admitting: Student

## 2018-06-16 ENCOUNTER — Other Ambulatory Visit: Payer: Self-pay

## 2018-06-16 ENCOUNTER — Inpatient Hospital Stay (HOSPITAL_COMMUNITY)
Admission: EM | Admit: 2018-06-16 | Discharge: 2018-06-20 | DRG: 054 | Disposition: A | Discharge status: Hospice | Payer: Medicaid Other | Source: Skilled Nursing Facility | Attending: Internal Medicine | Admitting: Internal Medicine

## 2018-06-16 DIAGNOSIS — C3432 Malignant neoplasm of lower lobe, left bronchus or lung: Secondary | ICD-10-CM | POA: Diagnosis present

## 2018-06-16 DIAGNOSIS — G936 Cerebral edema: Secondary | ICD-10-CM | POA: Diagnosis present

## 2018-06-16 DIAGNOSIS — Z20828 Contact with and (suspected) exposure to other viral communicable diseases: Secondary | ICD-10-CM | POA: Diagnosis present

## 2018-06-16 DIAGNOSIS — E1149 Type 2 diabetes mellitus with other diabetic neurological complication: Secondary | ICD-10-CM | POA: Diagnosis present

## 2018-06-16 DIAGNOSIS — Z66 Do not resuscitate: Secondary | ICD-10-CM

## 2018-06-16 DIAGNOSIS — I700 Atherosclerosis of aorta: Secondary | ICD-10-CM | POA: Diagnosis present

## 2018-06-16 DIAGNOSIS — Z7982 Long term (current) use of aspirin: Secondary | ICD-10-CM

## 2018-06-16 DIAGNOSIS — Z95828 Presence of other vascular implants and grafts: Secondary | ICD-10-CM

## 2018-06-16 DIAGNOSIS — N390 Urinary tract infection, site not specified: Secondary | ICD-10-CM | POA: Diagnosis present

## 2018-06-16 DIAGNOSIS — Z515 Encounter for palliative care: Secondary | ICD-10-CM

## 2018-06-16 DIAGNOSIS — J189 Pneumonia, unspecified organism: Secondary | ICD-10-CM | POA: Diagnosis present

## 2018-06-16 DIAGNOSIS — C7931 Secondary malignant neoplasm of brain: Secondary | ICD-10-CM | POA: Diagnosis not present

## 2018-06-16 DIAGNOSIS — I10 Essential (primary) hypertension: Secondary | ICD-10-CM | POA: Diagnosis present

## 2018-06-16 DIAGNOSIS — B961 Klebsiella pneumoniae [K. pneumoniae] as the cause of diseases classified elsewhere: Secondary | ICD-10-CM | POA: Diagnosis present

## 2018-06-16 DIAGNOSIS — Z79899 Other long term (current) drug therapy: Secondary | ICD-10-CM

## 2018-06-16 DIAGNOSIS — Z89511 Acquired absence of right leg below knee: Secondary | ICD-10-CM

## 2018-06-16 DIAGNOSIS — F419 Anxiety disorder, unspecified: Secondary | ICD-10-CM | POA: Diagnosis present

## 2018-06-16 DIAGNOSIS — E278 Other specified disorders of adrenal gland: Secondary | ICD-10-CM | POA: Diagnosis present

## 2018-06-16 DIAGNOSIS — K5900 Constipation, unspecified: Secondary | ICD-10-CM | POA: Diagnosis present

## 2018-06-16 DIAGNOSIS — F200 Paranoid schizophrenia: Secondary | ICD-10-CM | POA: Diagnosis present

## 2018-06-16 DIAGNOSIS — D649 Anemia, unspecified: Secondary | ICD-10-CM | POA: Diagnosis present

## 2018-06-16 DIAGNOSIS — F0390 Unspecified dementia without behavioral disturbance: Secondary | ICD-10-CM | POA: Diagnosis present

## 2018-06-16 DIAGNOSIS — F17210 Nicotine dependence, cigarettes, uncomplicated: Secondary | ICD-10-CM | POA: Diagnosis present

## 2018-06-16 DIAGNOSIS — Z7984 Long term (current) use of oral hypoglycemic drugs: Secondary | ICD-10-CM

## 2018-06-16 DIAGNOSIS — E1151 Type 2 diabetes mellitus with diabetic peripheral angiopathy without gangrene: Secondary | ICD-10-CM | POA: Diagnosis present

## 2018-06-16 DIAGNOSIS — E785 Hyperlipidemia, unspecified: Secondary | ICD-10-CM | POA: Diagnosis present

## 2018-06-16 DIAGNOSIS — E1165 Type 2 diabetes mellitus with hyperglycemia: Secondary | ICD-10-CM | POA: Diagnosis present

## 2018-06-16 DIAGNOSIS — J449 Chronic obstructive pulmonary disease, unspecified: Secondary | ICD-10-CM | POA: Diagnosis present

## 2018-06-16 DIAGNOSIS — Z818 Family history of other mental and behavioral disorders: Secondary | ICD-10-CM

## 2018-06-16 LAB — CBC WITH DIFFERENTIAL/PLATELET
Abs Immature Granulocytes: 0.04 10*3/uL (ref 0.00–0.07)
Basophils Absolute: 0 10*3/uL (ref 0.0–0.1)
Basophils Relative: 0 %
Eosinophils Absolute: 0 10*3/uL (ref 0.0–0.5)
Eosinophils Relative: 0 %
HCT: 32.4 % — ABNORMAL LOW (ref 39.0–52.0)
Hemoglobin: 9.4 g/dL — ABNORMAL LOW (ref 13.0–17.0)
Immature Granulocytes: 0 %
Lymphocytes Relative: 22 %
Lymphs Abs: 2.3 10*3/uL (ref 0.7–4.0)
MCH: 23.7 pg — ABNORMAL LOW (ref 26.0–34.0)
MCHC: 29 g/dL — ABNORMAL LOW (ref 30.0–36.0)
MCV: 81.8 fL (ref 80.0–100.0)
Monocytes Absolute: 0.6 10*3/uL (ref 0.1–1.0)
Monocytes Relative: 5 %
Neutro Abs: 7.4 10*3/uL (ref 1.7–7.7)
Neutrophils Relative %: 73 %
Platelets: 409 10*3/uL — ABNORMAL HIGH (ref 150–400)
RBC: 3.96 MIL/uL — ABNORMAL LOW (ref 4.22–5.81)
RDW: 15.9 % — ABNORMAL HIGH (ref 11.5–15.5)
WBC: 10.2 10*3/uL (ref 4.0–10.5)
nRBC: 0 % (ref 0.0–0.2)

## 2018-06-16 LAB — COMPREHENSIVE METABOLIC PANEL
ALT: 14 U/L (ref 0–44)
AST: 18 U/L (ref 15–41)
Albumin: 3 g/dL — ABNORMAL LOW (ref 3.5–5.0)
Alkaline Phosphatase: 59 U/L (ref 38–126)
Anion gap: 15 (ref 5–15)
BUN: 10 mg/dL (ref 8–23)
CO2: 26 mmol/L (ref 22–32)
Calcium: 9.4 mg/dL (ref 8.9–10.3)
Chloride: 99 mmol/L (ref 98–111)
Creatinine, Ser: 0.61 mg/dL (ref 0.61–1.24)
GFR calc Af Amer: >60 mL/min (ref >60)
GFR calc non Af Amer: >60 mL/min (ref >60)
Glucose, Bld: 151 mg/dL — ABNORMAL HIGH (ref 70–99)
Potassium: 3.9 mmol/L (ref 3.5–5.1)
Sodium: 140 mmol/L (ref 135–145)
Total Bilirubin: 0.4 mg/dL (ref 0.3–1.2)
Total Protein: 6.9 g/dL (ref 6.5–8.1)

## 2018-06-16 LAB — URINALYSIS, ROUTINE W REFLEX MICROSCOPIC
Bilirubin Urine: NEGATIVE
Glucose, UA: NEGATIVE mg/dL
Ketones, ur: NEGATIVE mg/dL
Nitrite: NEGATIVE
Protein, ur: NEGATIVE mg/dL
Specific Gravity, Urine: 1.017 (ref 1.005–1.030)
pH: 5 (ref 5.0–8.0)

## 2018-06-16 LAB — SARS CORONAVIRUS 2 BY RT PCR (HOSPITAL ORDER, PERFORMED IN ~~LOC~~ HOSPITAL LAB): SARS Coronavirus 2: NEGATIVE

## 2018-06-16 LAB — POC OCCULT BLOOD, ED: Fecal Occult Bld: NEGATIVE

## 2018-06-16 LAB — CBG MONITORING, ED: Glucose-Capillary: 152 mg/dL — ABNORMAL HIGH (ref 70–99)

## 2018-06-16 MED ORDER — METOPROLOL TARTRATE 25 MG PO TABS
25.0000 mg | ORAL_TABLET | Freq: Two times a day (BID) | ORAL | Status: DC
  Administered 2018-06-17 – 2018-06-20 (×8): 25 mg via ORAL
  Filled 2018-06-16 (×8): qty 1

## 2018-06-16 MED ORDER — IOHEXOL 300 MG/ML  SOLN
100.0000 mL | Freq: Once | INTRAMUSCULAR | Status: AC | PRN
  Administered 2018-06-16: 100 mL via INTRAVENOUS

## 2018-06-16 MED ORDER — GABAPENTIN 300 MG PO CAPS
300.0000 mg | ORAL_CAPSULE | Freq: Three times a day (TID) | ORAL | Status: DC
  Administered 2018-06-17 – 2018-06-20 (×10): 300 mg via ORAL
  Filled 2018-06-16 (×10): qty 1

## 2018-06-16 MED ORDER — CLONAZEPAM 1 MG PO TABS
1.0000 mg | ORAL_TABLET | Freq: Three times a day (TID) | ORAL | Status: DC
  Administered 2018-06-17 – 2018-06-20 (×10): 1 mg via ORAL
  Filled 2018-06-16 (×7): qty 1
  Filled 2018-06-16: qty 2
  Filled 2018-06-16 (×2): qty 1

## 2018-06-16 MED ORDER — SIMVASTATIN 5 MG PO TABS
5.0000 mg | ORAL_TABLET | Freq: Every day | ORAL | Status: DC
  Administered 2018-06-17 – 2018-06-18 (×3): 5 mg via ORAL
  Filled 2018-06-16 (×3): qty 1

## 2018-06-16 MED ORDER — INSULIN ASPART 100 UNIT/ML ~~LOC~~ SOLN
0.0000 [IU] | SUBCUTANEOUS | Status: DC
  Administered 2018-06-17: 3 [IU] via SUBCUTANEOUS
  Administered 2018-06-17: 2 [IU] via SUBCUTANEOUS
  Administered 2018-06-17: 3 [IU] via SUBCUTANEOUS
  Administered 2018-06-17: 1 [IU] via SUBCUTANEOUS
  Administered 2018-06-17: 2 [IU] via SUBCUTANEOUS
  Administered 2018-06-18: 7 [IU] via SUBCUTANEOUS
  Administered 2018-06-18: 5 [IU] via SUBCUTANEOUS
  Administered 2018-06-18: 01:00:00 2 [IU] via SUBCUTANEOUS
  Administered 2018-06-18: 5 [IU] via SUBCUTANEOUS
  Administered 2018-06-18 (×2): 3 [IU] via SUBCUTANEOUS
  Administered 2018-06-19 (×2): 5 [IU] via SUBCUTANEOUS
  Administered 2018-06-19 (×2): 2 [IU] via SUBCUTANEOUS
  Administered 2018-06-19 (×2): 3 [IU] via SUBCUTANEOUS
  Administered 2018-06-20 (×2): 2 [IU] via SUBCUTANEOUS
  Administered 2018-06-20: 01:00:00 5 [IU] via SUBCUTANEOUS
  Administered 2018-06-20: 05:00:00 1 [IU] via SUBCUTANEOUS

## 2018-06-16 MED ORDER — PANTOPRAZOLE SODIUM 40 MG PO TBEC
40.0000 mg | DELAYED_RELEASE_TABLET | Freq: Every day | ORAL | Status: DC
  Administered 2018-06-17 – 2018-06-20 (×4): 40 mg via ORAL
  Filled 2018-06-16 (×4): qty 1

## 2018-06-16 MED ORDER — TRIHEXYPHENIDYL HCL 5 MG PO TABS
5.0000 mg | ORAL_TABLET | Freq: Two times a day (BID) | ORAL | Status: DC
  Administered 2018-06-17 – 2018-06-20 (×8): 5 mg via ORAL
  Filled 2018-06-16 (×9): qty 1

## 2018-06-16 MED ORDER — GADOBUTROL 1 MMOL/ML IV SOLN
7.0000 mL | Freq: Once | INTRAVENOUS | Status: AC | PRN
  Administered 2018-06-16: 7 mL via INTRAVENOUS

## 2018-06-16 MED ORDER — CLONAZEPAM 1 MG PO TABS: 1.0000 mg | ORAL_TABLET | Freq: Three times a day (TID) | ORAL | Status: DC | PRN

## 2018-06-16 MED ORDER — SENNOSIDES-DOCUSATE SODIUM 8.6-50 MG PO TABS
2.0000 | ORAL_TABLET | Freq: Every day | ORAL | Status: DC
  Administered 2018-06-17 – 2018-06-19 (×4): 2 via ORAL
  Filled 2018-06-16 (×4): qty 2

## 2018-06-16 MED ORDER — ACETAMINOPHEN 325 MG PO TABS: 650.0000 mg | ORAL_TABLET | Freq: Four times a day (QID) | ORAL | Status: DC | PRN

## 2018-06-16 MED ORDER — DOXYCYCLINE HYCLATE 100 MG PO TABS
100.0000 mg | ORAL_TABLET | Freq: Once | ORAL | Status: AC
  Administered 2018-06-16: 15:00:00 100 mg via ORAL
  Filled 2018-06-16: qty 1

## 2018-06-16 MED ORDER — SODIUM CHLORIDE 0.9 % IV SOLN
1.0000 g | INTRAVENOUS | Status: DC
  Filled 2018-06-16: qty 10

## 2018-06-16 MED ORDER — SODIUM CHLORIDE 0.9 % IV SOLN
INTRAVENOUS | Status: AC
  Administered 2018-06-17 (×2): via INTRAVENOUS

## 2018-06-16 MED ORDER — DONEPEZIL HCL 10 MG PO TABS
10.0000 mg | ORAL_TABLET | Freq: Every day | ORAL | Status: DC
  Administered 2018-06-17 – 2018-06-18 (×3): 10 mg via ORAL
  Filled 2018-06-16: qty 2
  Filled 2018-06-16 (×2): qty 1

## 2018-06-16 MED ORDER — ONDANSETRON HCL 4 MG/2ML IJ SOLN: 4.0000 mg | Freq: Four times a day (QID) | INTRAMUSCULAR | Status: DC | PRN

## 2018-06-16 MED ORDER — SODIUM CHLORIDE 0.9 % IV SOLN
500.0000 mg | INTRAVENOUS | Status: DC
  Filled 2018-06-16: qty 500

## 2018-06-16 MED ORDER — ACETAMINOPHEN 650 MG RE SUPP: 650.0000 mg | Freq: Four times a day (QID) | RECTAL | Status: DC | PRN

## 2018-06-16 MED ORDER — CLOZAPINE 100 MG PO TABS
200.0000 mg | ORAL_TABLET | Freq: Two times a day (BID) | ORAL | Status: DC
  Administered 2018-06-17 – 2018-06-20 (×8): 200 mg via ORAL
  Filled 2018-06-16 (×9): qty 2

## 2018-06-16 MED ORDER — LACTULOSE 10 GM/15ML PO SOLN
30.0000 g | Freq: Two times a day (BID) | ORAL | Status: DC
  Administered 2018-06-17 – 2018-06-20 (×8): 30 g via ORAL
  Filled 2018-06-16 (×8): qty 45

## 2018-06-16 MED ORDER — LISINOPRIL 5 MG PO TABS
5.0000 mg | ORAL_TABLET | Freq: Every day | ORAL | Status: DC
  Administered 2018-06-17 – 2018-06-20 (×4): 5 mg via ORAL
  Filled 2018-06-16 (×4): qty 1

## 2018-06-16 MED ORDER — HALOPERIDOL 5 MG PO TABS
5.0000 mg | ORAL_TABLET | Freq: Two times a day (BID) | ORAL | Status: DC
  Administered 2018-06-17 – 2018-06-20 (×7): 5 mg via ORAL
  Filled 2018-06-16 (×8): qty 1

## 2018-06-16 MED ORDER — ONDANSETRON HCL 4 MG PO TABS: 4.0000 mg | ORAL_TABLET | Freq: Four times a day (QID) | ORAL | Status: DC | PRN

## 2018-06-16 NOTE — ED Triage Notes (Addendum)
Patient presents to the ED by EMS from [facility] center with c/o AMS by staff reporting that he was "not acting normal." LKW 12 noon yesterday 06/15/18. He feels weak and fatigued with no other complaints. Baseline is garbled speech with drooling.

## 2018-06-16 NOTE — ED Provider Notes (Addendum)
Care assumed from S. Petrucelli PA-C.  Please see her full H&P.  In short,  Craig Bentley is a 63 y.o. male presents for generalized weakness x 1 day. Pt lives at [facility] and the previous provider spoke to the staff and was informed that pt seems to be fatigued w/ generalized weakness & decreased activity. He also had a fall out of bed without LOC. His last known normal is 12 pm yesterday, over 24 hours ago.    Physical Exam  BP 100/60 (BP Location: Right Arm)   Pulse 89   Temp 99.1 F (37.3 C) (Oral)   Resp (!) 26   Ht 5\' 8"  (1.727 m)   Wt 72.6 kg   SpO2 91%   BMI 24.33 kg/m   Physical Exam  Constitutional: well-developed, well-nourished, no apparent distress.  Patient has right BKA. HENT: normocephalic, atraumatic. no cervical adenopathy Cardiovascular: normal rate and rhythm, distal pulses intact Pulmonary/Chest: effort normal; SPO2 was 100% on room air.  He is frequently coughing.  Lung sounds diminished on left side. No wheeze, rales, rhonchi appreciated. Abdominal: soft and nontender Musculoskeletal: full ROM, no edema GU: Chaperone Daioosha RN present for exam. Digital Rectal Exam reveals sphincter with good tone. No external hemorrhoids. No masses or fissures. Stool color is brown with no overt blood. No gross melena. Neurological: alert with goal directed thinking Skin: warm and dry, no rash, no diaphoresis Psychiatric: Patient is oriented to self, location and year.  Cranial nerves III through XII grossly intact.  Symmetric grip strength 5/5 of upper extremities.  Able to move and lift bilateral legs.   ED Course/Procedures    Results for orders placed or performed during the hospital encounter of 06/16/18 (from the past 24 hour(s))  CBC with Differential     Status: Abnormal   Collection Time: 06/16/18  1:24 PM  Result Value Ref Range   WBC 10.2 4.0 - 10.5 K/uL   RBC 3.96 (L) 4.22 - 5.81 MIL/uL   Hemoglobin 9.4 (L) 13.0 - 17.0 g/dL   HCT 32.4 (L)  39.0 - 52.0 %   MCV 81.8 80.0 - 100.0 fL   MCH 23.7 (L) 26.0 - 34.0 pg   MCHC 29.0 (L) 30.0 - 36.0 g/dL   RDW 15.9 (H) 11.5 - 15.5 %   Platelets 409 (H) 150 - 400 K/uL   nRBC 0.0 0.0 - 0.2 %   Neutrophils Relative % 73 %   Neutro Abs 7.4 1.7 - 7.7 K/uL   Lymphocytes Relative 22 %   Lymphs Abs 2.3 0.7 - 4.0 K/uL   Monocytes Relative 5 %   Monocytes Absolute 0.6 0.1 - 1.0 K/uL   Eosinophils Relative 0 %   Eosinophils Absolute 0.0 0.0 - 0.5 K/uL   Basophils Relative 0 %   Basophils Absolute 0.0 0.0 - 0.1 K/uL   Immature Granulocytes 0 %   Abs Immature Granulocytes 0.04 0.00 - 0.07 K/uL  Comprehensive metabolic panel     Status: Abnormal   Collection Time: 06/16/18  1:24 PM  Result Value Ref Range   Sodium 140 135 - 145 mmol/L   Potassium 3.9 3.5 - 5.1 mmol/L   Chloride 99 98 - 111 mmol/L   CO2 26 22 - 32 mmol/L   Glucose, Bld 151 (H) 70 - 99 mg/dL   BUN 10 8 - 23 mg/dL   Creatinine, Ser 0.61 0.61 - 1.24 mg/dL   Calcium 9.4 8.9 - 10.3 mg/dL   Total Protein 6.9 6.5 -  8.1 g/dL   Albumin 3.0 (L) 3.5 - 5.0 g/dL   AST 18 15 - 41 U/L   ALT 14 0 - 44 U/L   Alkaline Phosphatase 59 38 - 126 U/L   Total Bilirubin 0.4 0.3 - 1.2 mg/dL   GFR calc non Af Amer >60 >60 mL/min   GFR calc Af Amer >60 >60 mL/min   Anion gap 15 5 - 15  POC CBG, ED     Status: Abnormal   Collection Time: 06/16/18  1:30 PM  Result Value Ref Range   Glucose-Capillary 152 (H) 70 - 99 mg/dL  Urinalysis, Routine w reflex microscopic     Status: Abnormal   Collection Time: 06/16/18  4:47 PM  Result Value Ref Range   Color, Urine YELLOW YELLOW   APPearance HAZY (A) CLEAR   Specific Gravity, Urine 1.017 1.005 - 1.030   pH 5.0 5.0 - 8.0   Glucose, UA NEGATIVE NEGATIVE mg/dL   Hgb urine dipstick SMALL (A) NEGATIVE   Bilirubin Urine NEGATIVE NEGATIVE   Ketones, ur NEGATIVE NEGATIVE mg/dL   Protein, ur NEGATIVE NEGATIVE mg/dL   Nitrite NEGATIVE NEGATIVE   Leukocytes,Ua SMALL (A) NEGATIVE   RBC / HPF 0-5 0 - 5  RBC/hpf   WBC, UA 6-10 0 - 5 WBC/hpf   Bacteria, UA MANY (A) NONE SEEN   Squamous Epithelial / LPF 0-5 0 - 5   Mucus PRESENT     PORTABLE CHEST 1 VIEW COMPARISON: August 09, 2017 FINDINGS:There is consolidation in a portion of the medial left base. There is a minimal left pleural effusion. Lungs elsewhere are clear. Heart size and pulmonary vascularity are normal. No adenopathy. There is aortic atherosclerosis. No bone lesions. IMPRESSION: Medial left base consolidation, likely pneumonia. Small left pleural effusion. Lungs elsewhere clear. Stable cardiac silhouette. Aortic Atherosclerosis (ICD10-I70.0). Followup PA and lateral chest radiographs recommended in 3-4 weeks following trial of antibiotic therapy to ensure resolution and exclude underlying malignancy. Electronically Signed By: Lowella Grip III M.D. On: 06/16/2018 13:25    CT HEAD WITHOUT CONTRAST TECHNIQUE: Contiguous axial images were obtained from the base of the skull through the vertex without intravenous contrast. COMPARISON: 01/27/2018 FINDINGS: Brain: Extensive vasogenic edema in the left temporal lobe, left occipital and left parietal lobes. There is some sulci effacement in the left cerebrum particularly along the posterior aspect. No evidence for acute hemorrhage or midline shift. Negative for hydronephrosis. Mass lesion is not confidently identified but could be associated with the vasogenic edema. Mild effacement of the left frontal horn. Vascular: No hyperdense vessel or unexpected calcification. Skull: Normal. Negative for fracture or focal lesion. Sinuses/Orbits: No acute finding. Other: None. IMPRESSION: 1. Vasogenic edema involving the left temporal lobe, left occipital lobe and left parietal lobe. Findings raise concern for an underlying lesion such as infection or neoplasm. Recommend further characterization with an MRI, with and without contrast. 2. Mild sulci effacement in the left cerebral hemisphere without  significant mass effect. 3. Negative for acute hemorrhage. No significant midline shift. These results were called by telephone at the time of interpretation on 06/16/2018 at 4:07 pm to [facility] , who verbally acknowledged these results. Electronically SignedBy: Markus Daft M.D. On: 06/16/2018 16:09     MRI HEAD WITHOUT AND WITH CONTRAST TECHNIQUE: Multiplanar, multiecho pulse sequences of the brain and surrounding structures were obtained without and with intravenous contrast. CONTRAST: Gadavist 7 mL. COMPARISON: CT head earlier today. FINDINGS:Brain: There is a large intra-axial posterior LEFT temporal mass, 23 x  27 x 19 mm. Extensive vasogenic edema. No uncal herniation. No midline shift. The lesion is T2 hypointense, T1 hyperintense, marked susceptibility on gradient sequence, and avid postcontrast enhancement. Some areas of central heterogeneity could represent Necrosis. A second lesion, 5 mm, is identified, LEFT inferior frontal lobe near the gyrus rectus. Lesion is too small to accurately characterize except as that of a probable metastasis. Generalized atrophy. Minor white matter disease. Vascular: Normal flow voids. Skull and upper cervical spine: Normal marrow signal. Sinuses/Orbits: Negative. Other: None. IMPRESSION:Two separate enhancing lesions in the brain, with the dominant lesion in the LEFT posterior temporal lobe, and a subcentimeter lesion in the LEFT frontal lobe, are strongly suggestive of intracranial metastatic disease, unknown primary. Moderate vasogenic edema, but no uncal herniation or midline shift. Imaging characteristics of the larger lesion, with T1 shortening, T2 shortening, and susceptibility on gradient sequence suggest either a metastatic hemorrhagic neoplasm or metastatic melanoma. Electronically Signed By: Staci Righter M.D. On: 06/16/2018 18:47    MDM    63 year old male presenting with generalized weakness x1 day according to staff at [place] where he lives.  His workup was been started by previous provider and so far has been significant for hemoglobin of 9.4, rectal exam pending.  Hyperglycemia of 151 without an elevated anion gap.  No significant electrolyte disturbance.  Chest x-ray showed a likely pneumonia with medial left base consolidation.  Patient was given first dose of doxycycline.  Given patient's fall CT head was ordered and showed vasogenic edema involving left temporal lobe, left occipital lobe and left parietal lobe.  There is concern for underlying lesion such as neoplasm or infection, MRI recommended for further evaluation.Marland Kitchen   MRI brain shows two separate enhancing lesions in the brain. Radiologist reports they are strongly suggestive of intracranial metastatic disease. Metastatic hemorrhagic neoplasm vs metastatic melanoma. Also shows moderate vasogenic edema, but no uncal herniation or midline shift.  UA has small leukocytes, many bacteria, only 6-10 WBC, so ? UTI, culture sent. Rectal exam is without gross melena and fecal occult negative.  This case was discussed with Dr. Ashok Cordia who agrees with plan to admit. 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 Spoke with Dr. Hal Hope with hospitalist service who agrees to assume care of patient and bring into the hospital for further evaluation and management.  Coronavirus swab is pending and once resulted hospitalist will decide where to admit patient.   This note was prepared with assistance of Systems analyst. Occasional wrong-word or sound-a-like substitutions may have occurred due to the inherent limitations of voice recognition software.    Cherre Robins, PA-C 06/16/18 2225    Flint Melter 06/16/18 2234    Lajean Saver, MD 06/16/18 2240

## 2018-06-16 NOTE — ED Notes (Signed)
Pt unable to urinate at this time.  

## 2018-06-16 NOTE — ED Notes (Signed)
Pt transported to MRI 

## 2018-06-16 NOTE — H&P (Addendum)
History and Physical    Craig Bentley PTW:[id number] DOB: 09-Apr-1955 DOA: 06/16/2018  PCP: Megan Mans, NP  Patient coming from: Group home.  Chief Complaint: Weakness and fall.  HPI: Craig Bentley is a 63 y.o. male with history of schizophrenia, dementia, COPD, diabetes mellitus, peripheral vascular disease, hypertension was brought to the ER from group home after patient was found to be weak and not himself for last 24 hours.  Per the report obtained by the ER physician patient had a fall also.  No complaints of any chest pain shortness of breath nausea vomiting or diarrhea.  ED Course: In the ER patient appeared nonfocal.  Mildly confused.  At times hallucinating.  MRI of the brain shows 2 lesions concerning for metastatic lesions in the brain.  On-call neurosurgeon was consulted and admitted for further management of further metastatic work-up and weakness.  Lab works show creatinine of 0.6 glucose 151 AST 18 ALT 14 hemoglobin is 9.4 platelets 409.  Chest x-ray was showing infiltrates concerning for pneumonia for which patient was started on empiric antibiotics and COVID-19 was ordered which was negative.  Review of Systems: As per HPI, rest all negative.   Past Medical History:  Diagnosis Date   Anxiety    Constipation    COPD (chronic obstructive pulmonary disease) (HCC)    Diabetes mellitus    Fecal impaction ([place])    Hypertension    Pneumonia    Schizophrenia, schizo-affective ([place])     Past Surgical History:  Procedure Laterality Date   AMPUTATION Right 04/23/2015   Procedure: Right Fifth Toe Amputation;  Surgeon: Conrad Heckscherville, MD;  Location: [place];  Service: Vascular;  Laterality: Right;   AMPUTATION Right 08/27/2015   Procedure: RIGHT BELOW KNEE AMPUTATION ;  Surgeon: Newt Minion, MD;  Location: [place];  Service: Orthopedics;  Laterality: Right;   AORTA - BILATERAL FEMORAL ARTERY BYPASS GRAFT Bilateral 04/23/2015   Procedure: AORTOBIFEMORAL BYPASS GRAFT;   Surgeon: Conrad Bigfoot, MD;  Location: [place];  Service: Vascular;  Laterality: Bilateral;   FEMORAL-FEMORAL BYPASS GRAFT Left 04/23/2015   Procedure:  Left Common Femoral Artery to Superficial Femoral Artery Bypass Graft with Vein;  Surgeon: Conrad St. Meinrad, MD;  Location: [place];  Service: Vascular;  Laterality: Left;   FEMORAL-TIBIAL BYPASS GRAFT Right 04/23/2015   Procedure: Right Common Femoral to Posterior Tibial Bypass with Composite Vein Graft. ;  Surgeon: Conrad Laurel, MD;  Location: [facility];  Service: Vascular;  Laterality: Right;   PERIPHERAL VASCULAR CATHETERIZATION N/A 04/07/2015   Procedure: Abdominal Aortogram;  Surgeon: Angelia Mould, MD;  Location: [facility];  Service: Cardiovascular;  Laterality: N/A;     reports that he has been smoking cigarettes. He has a 75.00 pack-year smoking history. He has never used smokeless tobacco. He reports that he does not drink alcohol or use drugs.  No Known Allergies  Family History  Problem Relation Age of Onset   Mental illness Mother     Prior to Admission medications   Medication Sig Start Date End Date Taking? Authorizing Provider  acetaminophen (TYLENOL) 325 MG tablet Take 650 mg by mouth every 6 (six) hours as needed for mild pain, fever or headache.     [provider]  aspirin EC 81 MG tablet Take 1 tablet (81 mg total) by mouth daily. 05/01/15   Alvia Grove, PA-C  clonazePAM (KLONOPIN) 1 MG tablet Take 1 mg by mouth 3 (three) times daily as needed  for anxiety.    [provider]  cloZAPine (CLOZARIL) 100 MG tablet Take 1-4 tablets (100-400 mg total) by mouth 3 (three) times daily. Take 1 tablet (100 mg) by mouth daily at 8am and 4pm, take 4 tablets (400 mg) at bedtime Patient taking differently: Take 200 mg by mouth 2 (two) times daily.  08/28/15   Rai, Ripudeep K, MD  donepezil (ARICEPT) 5 MG tablet Take 5 mg by mouth at bedtime.    [provider]  gabapentin (NEURONTIN) 300 MG capsule Take  300 mg by mouth 3 (three) times daily.    [provider]  haloperidol (HALDOL) 5 MG tablet Take 1 tablet (5 mg total) by mouth at bedtime. Patient taking differently: Take 5 mg by mouth 2 (two) times daily.  08/28/15   Rai, Ripudeep K, MD  lactulose (CHRONULAC) 10 GM/15ML solution Take 30 g by mouth 2 (two) times daily.    [provider]  lisinopril (PRINIVIL,ZESTRIL) 5 MG tablet Take 5 mg by mouth daily.    [provider]  metFORMIN (GLUCOPHAGE) 500 MG tablet Take 500 mg by mouth daily with breakfast.     [provider]  metoprolol tartrate (LOPRESSOR) 25 MG tablet Take 25 mg by mouth 2 (two) times daily.    [provider]  Multiple Vitamins-Minerals (CERTA-VITE PO) Take 1 tablet by mouth daily.    [provider]  pantoprazole (PROTONIX) 40 MG tablet Take 1 tablet (40 mg total) by mouth daily. 02/20/15   Theodis Blaze, MD  senna-docusate (SENOKOT-S) 8.6-50 MG tablet Take 2 tablets by mouth at bedtime. 09/09/15   Love, Ivan Anchors, PA-C  simvastatin (ZOCOR) 5 MG tablet Take 5 mg by mouth at bedtime.    [provider]  trihexyphenidyl (ARTANE) 5 MG tablet Take 5 mg by mouth 2 (two) times daily.     [provider]    Physical Exam: Vitals:   06/16/18 2045 06/16/18 2100 06/16/18 2115 06/16/18 2130  BP: (!) 120/57 (!) 111/58 (!) 122/54 106/80  Pulse: 78 79 79 90  Resp: (!) 23 (!) 24 (!) 22 (!) 21  Temp:      TempSrc:      SpO2: 93% 94% 93% 96%  Weight:      Height:          Constitutional: Moderately built and nourished. Vitals:   06/16/18 2045 06/16/18 2100 06/16/18 2115 06/16/18 2130  BP: (!) 120/57 (!) 111/58 (!) 122/54 106/80  Pulse: 78 79 79 90  Resp: (!) 23 (!) 24 (!) 22 (!) 21  Temp:      TempSrc:      SpO2: 93% 94% 93% 96%  Weight:      Height:       Eyes: Anicteric no pallor. ENMT: No discharge from the ears eyes nose or mouth. Neck: No mass felt.  No neck rigidity. Respiratory: No rhonchi or  crepitations. Cardiovascular: S1-S2 heard. Abdomen: Soft nontender bowel sounds present. Musculoskeletal: No edema.  No joint effusion. Skin: No rash. Neurologic: Alert awake oriented to his name follows commands moves all extremities. Psychiatric: Oriented to his name.   Labs on Admission: I have personally reviewed following labs and imaging studies  CBC: Recent Labs  Lab 06/16/18 1324  WBC 10.2  NEUTROABS 7.4  HGB 9.4*  HCT 32.4*  MCV 81.8  PLT 366*   Basic Metabolic Panel: Recent Labs  Lab 06/16/18 1324  NA 140  K 3.9  CL 99  CO2 26  GLUCOSE 151*  BUN 10  CREATININE 0.61  CALCIUM 9.4   GFR: Estimated Creatinine Clearance: 91.4 mL/min (by C-G formula based on SCr of 0.61 mg/dL). Liver Function Tests: Recent Labs  Lab 06/16/18 1324  AST 18  ALT 14  ALKPHOS 59  BILITOT 0.4  PROT 6.9  ALBUMIN 3.0*   No results for input(s): LIPASE, AMYLASE in the last 168 hours. No results for input(s): AMMONIA in the last 168 hours. Coagulation Profile: No results for input(s): INR, PROTIME in the last 168 hours. Cardiac Enzymes: No results for input(s): CKTOTAL, CKMB, CKMBINDEX, TROPONINI in the last 168 hours. BNP (last 3 results) No results for input(s): PROBNP in the last 8760 hours. HbA1C: No results for input(s): HGBA1C in the last 72 hours. CBG: Recent Labs  Lab 06/16/18 1330  GLUCAP 152*   Lipid Profile: No results for input(s): CHOL, HDL, LDLCALC, TRIG, CHOLHDL, LDLDIRECT in the last 72 hours. Thyroid Function Tests: No results for input(s): TSH, T4TOTAL, FREET4, T3FREE, THYROIDAB in the last 72 hours. Anemia Panel: No results for input(s): VITAMINB12, FOLATE, FERRITIN, TIBC, IRON, RETICCTPCT in the last 72 hours. Urine analysis:    Component Value Date/Time   COLORURINE YELLOW 06/16/2018 1647   APPEARANCEUR HAZY (A) 06/16/2018 1647   LABSPEC 1.017 06/16/2018 1647   PHURINE 5.0 06/16/2018 1647   GLUCOSEU NEGATIVE 06/16/2018 1647   HGBUR SMALL (A)  06/16/2018 1647   BILIRUBINUR NEGATIVE 06/16/2018 1647   KETONESUR NEGATIVE 06/16/2018 1647   PROTEINUR NEGATIVE 06/16/2018 1647   UROBILINOGEN 1.0 09/16/2013 2243   NITRITE NEGATIVE 06/16/2018 1647   LEUKOCYTESUR SMALL (A) 06/16/2018 1647   Sepsis Labs: @LABRCNTIP (procalcitonin:4,lacticidven:4) ) Recent Results (from the past 240 hour(s))  SARS Coronavirus [facility] order, Performed in [place] hospital lab)     Status: None   Collection Time: 06/16/18  7:57 PM  Result Value Ref Range Status   SARS Coronavirus 2 NEGATIVE NEGATIVE Final    Comment: (NOTE) If result is NEGATIVE SARS-CoV-2 target nucleic acids are NOT DETECTED. The SARS-CoV-2 RNA is generally detectable in upper and lower  respiratory specimens during the acute phase of infection. The lowest  concentration of SARS-CoV-2 viral copies this assay can detect is 250  copies / mL. A negative result does not preclude SARS-CoV-2 infection  and should not be used as the sole basis for treatment or other  patient management decisions.  A negative result may occur with  improper specimen collection / handling, submission of specimen other  than nasopharyngeal swab, presence of viral mutation(s) within the  areas targeted by this assay, and inadequate number of viral copies  (<250 copies / mL). A negative result must be combined with clinical  observations, patient history, and epidemiological information. If result is POSITIVE SARS-CoV-2 target nucleic acids are DETECTED. The SARS-CoV-2 RNA is generally detectable in upper and lower  respiratory specimens dur ing the acute phase of infection.  Positive  results are indicative of active infection with SARS-CoV-2.  Clinical  correlation with patient history and other diagnostic information is  necessary to determine patient infection status.  Positive results do  not rule out bacterial infection or co-infection with other viruses. If result is PRESUMPTIVE  POSTIVE SARS-CoV-2 nucleic acids MAY BE PRESENT.   A presumptive positive result was obtained on the submitted specimen  and confirmed on repeat testing.  While 2019 novel coronavirus  (SARS-CoV-2) nucleic acids may be present in the submitted sample  additional confirmatory testing may be necessary for epidemiological  and / or clinical management purposes  to differentiate between  SARS-CoV-2 and other Sarbecovirus currently known to infect humans.  If clinically indicated additional testing with an alternate test  methodology [phone]) is advised. The SARS-CoV-2 RNA is generally  detectable in upper and lower respiratory sp ecimens during the acute  phase of infection. The expected result is Negative. Fact Sheet for Patients:  StrictlyIdeas.no Fact Sheet for Healthcare Providers: BankingDealers.co.za This test is not yet approved or cleared by the [place] FDA and has been authorized for detection and/or diagnosis of SARS-CoV-2 by FDA under an Emergency Use Authorization (EUA).  This EUA will remain in effect (meaning this test can be used) for the duration of the COVID-19 declaration under Section 564(b)(1) of the Act, 21 U.S.C. section 360bbb-3(b)(1), unless the authorization is terminated or revoked sooner. Performed at [facility], [place] [address] [place], [place] [address]      Radiological Exams on Admission: Ct Head Wo Contrast  Result Date: 06/16/2018 CLINICAL DATA:  63 year old with encephalopathy. Altered mental status. EXAM: CT HEAD WITHOUT CONTRAST TECHNIQUE: Contiguous axial images were obtained from the base of the skull through the vertex without intravenous contrast. COMPARISON:  01/27/2018 FINDINGS: Brain: Extensive vasogenic edema in the left temporal lobe, left occipital and left parietal lobes. There is some sulci effacement in the left cerebrum particularly along the posterior aspect. No evidence for  acute hemorrhage or midline shift. Negative for hydronephrosis. Mass lesion is not confidently identified but could be associated with the vasogenic edema. Mild effacement of the left frontal horn. Vascular: No hyperdense vessel or unexpected calcification. Skull: Normal. Negative for fracture or focal lesion. Sinuses/Orbits: No acute finding. Other: None. IMPRESSION: 1. Vasogenic edema involving the left temporal lobe, left occipital lobe and left parietal lobe. Findings raise concern for an underlying lesion such as infection or neoplasm. Recommend further characterization with an MRI, with and without contrast. 2. Mild sulci effacement in the left cerebral hemisphere without significant mass effect. 3. Negative for acute hemorrhage.  No significant midline shift. These results were called by telephone at the time of interpretation on 06/16/2018 at 4:07 pm to [facility] , who verbally acknowledged these results. Electronically Signed   By: Markus Daft M.D.   On: 06/16/2018 16:09   Mr Brain W And Wo Contrast  Result Date: 06/16/2018 CLINICAL DATA:  Encephalopathy with altered mental status. EXAM: MRI HEAD WITHOUT AND WITH CONTRAST TECHNIQUE: Multiplanar, multiecho pulse sequences of the brain and surrounding structures were obtained without and with intravenous contrast. CONTRAST:  Gadavist 7 mL. COMPARISON:  CT head earlier today. FINDINGS: Brain: There is a large intra-axial posterior LEFT temporal mass, 23 x 27 x 19 mm. Extensive vasogenic edema. No uncal herniation. No midline shift. The lesion is T2 hypointense, T1 hyperintense, marked susceptibility on gradient sequence, and avid postcontrast enhancement. Some areas of central heterogeneity could represent necrosis. A second lesion, 5 mm, is identified, LEFT inferior frontal lobe near the gyrus rectus. Lesion is too small to accurately characterize except as that of a probable metastasis. Generalized atrophy.  Minor white matter disease. Vascular:  Normal flow voids. Skull and upper cervical spine: Normal marrow signal. Sinuses/Orbits: Negative. Other: None. IMPRESSION: Two separate enhancing lesions in the brain, with the dominant lesion in the LEFT posterior temporal lobe, and a subcentimeter lesion in the LEFT frontal lobe, are strongly suggestive of intracranial metastatic disease, unknown primary. Moderate vasogenic edema, but no uncal herniation or midline shift. Imaging characteristics of the  larger lesion, with T1 shortening, T2 shortening, and susceptibility on gradient sequence suggest either a metastatic hemorrhagic neoplasm or metastatic melanoma. Electronically Signed   By: Staci Righter M.D.   On: 06/16/2018 18:47   Dg Chest Portable 1 View  Result Date: 06/16/2018 CLINICAL DATA:  Altered mental status. EXAM: PORTABLE CHEST 1 VIEW COMPARISON:  August 09, 2017 FINDINGS: There is consolidation in a portion of the medial left base. There is a minimal left pleural effusion. Lungs elsewhere are clear. Heart size and pulmonary vascularity are normal. No adenopathy. There is aortic atherosclerosis. No bone lesions. IMPRESSION: Medial left base consolidation, likely pneumonia. Small left pleural effusion. Lungs elsewhere clear. Stable cardiac silhouette. Aortic Atherosclerosis (ICD10-I70.0). Followup PA and lateral chest radiographs recommended in 3-4 weeks following trial of antibiotic therapy to ensure resolution and exclude underlying malignancy. Electronically Signed   By: Lowella Grip III M.D.   On: 06/16/2018 13:25    EKG: Independently reviewed.  Normal sinus rhythm.  Assessment/Plan Principal Problem:   Brain metastasis (HCC) Active Problems:   COPD (chronic obstructive pulmonary disease) (HCC)   Paranoid schizophrenia ([place])   CAP (community acquired pneumonia)    1. Metastatic brain lesions -I have ordered CT of the chest and abdomen for further checking for primary lesion.  Neurosurgery on-call has been consulted.  Since  there is some edema and metastatic lesion to the brain neurosurgery recommended Decadron which has been ordered.  Note that patient is diabetic follow CBGs closely. 2. Diabetes mellitus type 2 we will keep patient on sliding scale coverage.  Note that patient is on Decadron. 3. Paranoid schizophrenia on Haldol Clozaril and trihexyphenidyl. 4. Anxiety on Klonopin. 5. Dementia on Aricept. 6. Hypertension on lisinopril and metoprolol. 7. Anemia appears to be chronic follow CBC. 8. Possible pneumonia for which patient is on empiric antibiotics.  COVID-19 was negative.  Follow CT chest which has been ordered to further study on for the primary lesion. 9. COPD -not actively wheezing. 10. Hyperlipidemia on statins. 11. History of peripheral vascular disease.   DVT prophylaxis: SCDs.  Since metastatic lesion in the brain was showing possible hemorrhagic. Code Status: Full code. Family Communication: No family at the bedside. Disposition Plan: To be determined. Consults called: Neurosurgery. Admission status: Observation.   Rise Patience MD [facility] Pager [phone].  If 7PM-7AM, please contact night-coverage [email or web address] Password [facility]  06/16/2018, 9:37 PM

## 2018-06-16 NOTE — ED Provider Notes (Signed)
[facility] Provider Note   CSN: [id number] Arrival date & time: 06/16/18  1214  History   Chief Complaint Chief Complaint  Patient presents with   Weakness    HPI Turner Baillie is a 63 y.o. male with a hx of COPD, DM, HTN, PVD, prior BKA of the RLE, and schizophrenia who presents to the emergency department from [facility] facility for generalized weakness & confusion that began yesterday. Last known normal 12PM yesterday (>24 hours prior). History provided by patient & care facility staff. History somewhat limited secondary to AMS.   Upon arrival to the ER patient states staff was concerned about him, he notes he has been feeling tired with some degree of generalized weakness. No alleviating/aggravating factors. Denies pain in any location (head, chest, abdomen), numbness, focal weakness, change in vision, fever, vomiting, diarrhea, or problems urinating. Patient seems confused at times, but has been able to answer majority of questions and follow commands. I called and spoke with Wells Guiles from [employer]- she states that yesterday Mr. Tinnel seemed to be fatigued w/ generalized weakness & decreased activity, he was eating fairly slowly, and he did have a fall out of bed without LOC. They have noted some confusion with odd questions about oranges, and mentioning having killed someone. No alleviating/aggravating factors staff has noted. It was queried that it was psychiatric related. Sent to ER for further assessment. He does have garbled speech at baseline- no change.      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   Low grade fever    Surgical wound dehiscence    Opacity of lung on imaging study    Acute blood loss anemia    Fever    SIRS (systemic inflammatory response syndrome) (HCC)    Leukocytosis    Status post below knee amputation of right lower extremity ([place]) 08/28/2015   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[employer]) 08/23/2015   Sepsis ([place]) 08/21/2015   ARF (acute renal failure) ([place]) 08/21/2015   Acute encephalopathy 08/21/2015   Post-operative state    Respiratory failure ([place])    Surgery, elective    Anxiety    Paranoid schizophrenia ([place])    Atherosclerosis of extremity with gangrene ([place])    Hyperglycemia    Preoperative cardiovascular examination 04/11/2015   PVD (peripheral vascular disease) ([place]) 04/10/2015   Absolute anemia    Diabetic foot infection ([place]) 04/02/2015   Right foot pain 10/62/6948   Neutrophilic leukocytosis 54/62/7035   Esophageal abnormality    HCAP (healthcare-associated pneumonia)    COPD exacerbation (HCC)    Dyspnea    Respiratory distress    Sepsis due to pneumonia ([place]) 02/14/2015   Acute respiratory failure with hypoxia ([place]) 02/13/2015   Diabetes mellitus with neurologic complication, without long-term current use of insulin ([place]) 07/20/2011   Essential hypertension    COPD (chronic obstructive pulmonary disease) ([place])     Past Surgical History:  Procedure Laterality Date   AMPUTATION Right 04/23/2015   Procedure: Right Fifth Toe Amputation;  Surgeon: Conrad [place], MD;  Location: [facility];  Service: Vascular;  Laterality: Right;   AMPUTATION Right 08/27/2015   Procedure: RIGHT BELOW KNEE AMPUTATION ;  Surgeon: Newt Minion, MD;  Location: [facility];  Service: Orthopedics;  Laterality: Right;   AORTA - BILATERAL FEMORAL ARTERY BYPASS GRAFT Bilateral 04/23/2015   Procedure: AORTOBIFEMORAL BYPASS GRAFT;  Surgeon: Conrad [place], MD;  Location: [facility];  Service: Vascular;  Laterality: Bilateral;    FEMORAL-FEMORAL BYPASS GRAFT Left 04/23/2015   Procedure:  Left Common Femoral Artery to Superficial Femoral Artery Bypass Graft with Vein;  Surgeon: Conrad [place], MD;  Location: [facility];  Service: Vascular;  Laterality: Left;   FEMORAL-TIBIAL BYPASS GRAFT Right 04/23/2015   Procedure: Right Common Femoral to Posterior Tibial Bypass with Composite Vein Graft. ;  Surgeon: Conrad , MD;  Location: [facility];  Service: Vascular;  Laterality: Right;   PERIPHERAL VASCULAR CATHETERIZATION N/A 04/07/2015   Procedure: Abdominal Aortogram;  Surgeon: Angelia Mould, MD;  Location: [facility];  Service: Cardiovascular;  Laterality: N/A;        Home Medications    Prior to Admission medications   Medication Sig Start Date End Date Taking? Authorizing Provider  acetaminophen (TYLENOL) 325 MG tablet Take 650 mg by mouth every 6 (six) hours as needed for mild pain, fever or headache.     [provider]  aspirin EC 81 MG tablet Take 1 tablet (81 mg total) by mouth daily. 05/01/15   Alvia Grove, PA-C  clonazePAM (KLONOPIN) 1 MG tablet Take 1 mg by mouth 3 (three) times daily as needed for anxiety.    [provider]  cloZAPine (CLOZARIL) 100 MG tablet Take 1-4 tablets (100-400 mg total) by mouth 3 (three) times daily. Take 1 tablet (100 mg) by mouth daily at 8am and 4pm, take 4 tablets (400 mg) at bedtime Patient taking differently: Take 200 mg by mouth 2 (two) times daily.  08/28/15   Rai, Ripudeep K, MD  donepezil (ARICEPT) 5 MG tablet Take 5 mg by mouth at bedtime.    [provider]  gabapentin (NEURONTIN) 300 MG capsule Take 300 mg by mouth 3 (three) times daily.    [provider]  haloperidol (HALDOL) 5 MG tablet Take 1 tablet (5 mg total) by mouth at bedtime. Patient taking differently: Take 5 mg by mouth 2 (two) times daily.  08/28/15   Rai, Ripudeep K, MD  lactulose (CHRONULAC) 10 GM/15ML solution Take 30 g by mouth 2 (two) times daily.    [provider]  lisinopril (PRINIVIL,ZESTRIL) 5 MG tablet Take 5 mg by mouth daily.    [provider]  metFORMIN (GLUCOPHAGE) 500 MG tablet Take 500 mg by mouth daily with breakfast.     [provider]  metoprolol tartrate (LOPRESSOR) 25 MG tablet Take 25 mg by mouth 2 (two) times daily.    [provider]  Multiple Vitamins-Minerals (CERTA-VITE PO) Take 1 tablet by mouth daily.    [provider]  pantoprazole (PROTONIX) 40 MG tablet Take 1 tablet (40 mg total) by mouth daily. 02/20/15   Theodis Blaze, MD  senna-docusate (SENOKOT-S) 8.6-50 MG tablet Take 2 tablets by mouth at bedtime. 09/09/15   Love, Ivan Anchors, PA-C  simvastatin (ZOCOR) 5 MG tablet Take 5 mg by mouth at bedtime.    [provider]  trihexyphenidyl (ARTANE) 5 MG tablet Take 5 mg by mouth 2 (two) times daily.     [provider]    Family History Family History  Problem Relation Age of Onset   Mental illness Mother     Social History Social History   Tobacco Use   Smoking status: Current Every Day Smoker    Packs/day: 1.50    Years: 50.00    Pack years: 75.00    Types: Cigarettes   Smokeless tobacco: Never Used  Substance Use Topics   Alcohol use: No    Alcohol/week: 0.0 standard drinks   Drug use: No     Allergies   Patient has no known allergies.   Review of Systems Review of Systems  Unable to perform ROS: Mental status change   Physical Exam Updated Vital Signs BP 125/63    Pulse 82    Temp 99.1 F (37.3 C) (Oral)    Resp 16    SpO2 96% Comment: Simultaneous filing. User may not have seen previous data.  Physical Exam Vitals signs and nursing note reviewed.  Constitutional:      General: He is not in acute distress.    Appearance: He is well-developed. He is not toxic-appearing.  HENT:     Head: Normocephalic and atraumatic. No raccoon eyes or Battle's sign.     Right Ear: No hemotympanum.     Left Ear: No hemotympanum.     Nose:  Nose normal.  Eyes:     General:        Right eye: No discharge.        Left eye: No discharge.     Extraocular Movements: Extraocular movements intact.     Conjunctiva/sclera: Conjunctivae normal.     Pupils: Pupils are equal, round, and reactive to light.  Neck:     Musculoskeletal: Normal range of motion and neck supple. No neck rigidity.     Comments: No midline cervical tenderness.  Cardiovascular:     Rate and Rhythm: Normal rate and regular rhythm.  Pulmonary:     Effort: Pulmonary effort is normal. No respiratory distress.     Breath sounds: Normal breath sounds. No wheezing, rhonchi or rales.     Comments: Frequent wet cough throughout exam.  Abdominal:     General: There is no distension.     Palpations: Abdomen is soft.     Tenderness: There is no abdominal tenderness. There is no guarding or rebound.  Musculoskeletal:     Comments: R BKA noted. Moving all extremities. No palpable bony tenderness. NO midline spinal tenderness.   Skin:    General: Skin is warm and dry.     Findings: No rash.  Neurological:     General: No focal deficit present.     Mental Status: He is alert.     Comments: Oriented to person, city, and year. Disoriented to state & month. No obvious cranial nerve deficits. Sensation grossly intact x 4. 5/5 symmetric grip strength. Able to lift bilateral legs off of the stretcher without difficulty. Negative pronator drift.  Psychiatric:        Behavior: Behavior normal.    ED Treatments / Results  Labs (all labs ordered are listed, but only abnormal results are displayed) Labs Reviewed  CBC WITH DIFFERENTIAL/PLATELET - Abnormal; Notable for the following components:      Result Value   RBC 3.96 (*)    Hemoglobin 9.4 (*)    HCT 32.4 (*)    MCH 23.7 (*)    MCHC 29.0 (*)    RDW 15.9 (*)    Platelets 409 (*)    All other components within normal limits  COMPREHENSIVE METABOLIC PANEL - Abnormal; Notable for the following components:   Glucose, Bld  151 (*)    Albumin 3.0 (*)    All other components within normal limits  CBG MONITORING, ED - Abnormal; Notable for the following components:   Glucose-Capillary 152 (*)    All other components within normal limits  POC OCCULT BLOOD, ED    EKG EKG Interpretation  Date/Time:  Friday June 16 2018 12:21:32 EDT Ventricular Rate:  83 PR Interval:    QRS Duration: 91 QT Interval:  387 QTC Calculation: 455 R Axis:   56 Text Interpretation:  Sinus rhythm since last tracing no significant change Confirmed by Noemi Chapel [phone]) on 06/16/2018 12:25:59 PM   Radiology Ct Head Wo Contrast  Result Date: 06/16/2018 CLINICAL DATA:  63 year old with encephalopathy. Altered mental status. EXAM: CT HEAD WITHOUT CONTRAST TECHNIQUE: Contiguous axial images were obtained from the base of the skull through the vertex without intravenous contrast. COMPARISON:  01/27/2018 FINDINGS: Brain: Extensive vasogenic edema in the left temporal lobe, left occipital and left parietal lobes. There is some sulci effacement in the left cerebrum particularly along the posterior aspect. No evidence for acute hemorrhage or midline shift. Negative for hydronephrosis. Mass lesion is not confidently identified but could be associated with the vasogenic edema. Mild effacement of the left frontal horn. Vascular: No hyperdense vessel or unexpected calcification. Skull: Normal. Negative for fracture or focal lesion. Sinuses/Orbits: No acute finding. Other: None. IMPRESSION: 1. Vasogenic edema involving the left temporal lobe, left occipital lobe and left parietal lobe. Findings raise concern for an underlying lesion such as infection or neoplasm. Recommend further characterization with an MRI, with and without contrast. 2. Mild sulci effacement in the left cerebral hemisphere without significant mass effect. 3. Negative for acute hemorrhage.  No significant midline shift. These results were called by telephone at the time of  interpretation on 06/16/2018 at 4:07 pm to [facility] , who verbally acknowledged these results. Electronically Signed   By: Markus Daft M.D.   On: 06/16/2018 16:09   Dg Chest Portable 1 View  Result Date: 06/16/2018 CLINICAL DATA:  Altered mental status. EXAM: PORTABLE CHEST 1 VIEW COMPARISON:  August 09, 2017 FINDINGS: There is consolidation in a portion of the medial left base. There is a minimal left pleural effusion. Lungs elsewhere are clear. Heart size and pulmonary vascularity are normal. No adenopathy. There is aortic atherosclerosis. No bone lesions. IMPRESSION: Medial left base consolidation, likely pneumonia. Small left pleural effusion. Lungs elsewhere clear. Stable cardiac silhouette. Aortic Atherosclerosis (ICD10-I70.0). Followup PA and lateral chest radiographs recommended in 3-4 weeks following trial of antibiotic therapy to ensure resolution and exclude underlying malignancy. Electronically Signed   By: Lowella Grip III M.D.   On: 06/16/2018 13:25    Procedures Procedures (including critical care time)  Medications Ordered in ED Medications - No data to display   Initial Impression / Assessment and Plan / ED Course  I have reviewed the triage vital signs and the nursing notes.  Pertinent labs & imaging results that were available during my care of the patient were reviewed by me and considered in my medical decision making (see chart for details).   Patient presents to the ED from [facility] facility for generalized weakness and some confusion. Nontoxic appearing, initial vitals WNL. On exam he becomes mildly confused w/ some questions, but is able to communicate and answer if re-stated. Speech is garbled some, but this is apparently not an acute  change. No focal neuro deficits noted, > 24 hours since onset, therefore not code stroke, additionally low suspicion for ischemic CVA. He is not tachycardic, his lungs are clear, his abdomen is nontender. He  has been noted to have a wet cough throughout exam. There is mention of a fall from the nursing care staff. NO evidence of serious head/neck/back injury on exam, no focal deficits or bony tenderness noted. Plan for CT head. Will obtain basic labs & CXR as well.   Work-up reviewed:  CBC: No leukcoytosis. Hgb 9.4 decreased from prior 11.1 on record plan for fecal occult.  CMP: Hyperglycemia @ 151 without gap elevation or acidosis. No significant electrolyte disturbance.  CXR: IMPRESSION: Medial left base consolidation, likely pneumonia. Small left pleural effusion. Lungs elsewhere clear. Stable cardiac silhouette. Aortic Atherosclerosis (ICD10-I70.0). Followup PA and lateral chest radiographs recommended in 3-4 weeks following trial of antibiotic therapy to ensure resolution and exclude underlying malignancy  Suspect pneumonia as underlying cause of his cough that has been observed in the ER, this may be contributing to his weakness and mild confusion as well. Afebrile in the ER. Maintain his SpO2. Will administer first dose of doxycycline in the ED. Fecal occult secondary to anemia & CT head secondary to AMS w/ recent fall pending.   Patient care transitioned to [facility] PA-C at change of shift pending above results. If results are re-assuring and no significant clinical change in patient status anticipate discharge back to his facility on Doxycycline.   16:07: CONSULT: Discussed w/ radiologist Dr. Anselm Pancoast- CT head w/ vasogenic edema involving the left temporal lobe, left occipital lobe and left parietal lobe. Findings raise concern for an underlying lesion such as infection or neoplasm. Recommend further characterization with an MRI, with and without contrast.   MRI of the brain w/wo contrast ordered. Alb[place]ze PA-C aware of CT & follow up on MRI.   Findings and plan of care discussed with supervising physician Dr. Sabra Heck who is in agreement.   Final Clinical Impressions(s) / ED Diagnoses    Final diagnoses:  None    ED Discharge Orders    None       Amaryllis Dyke, PA-C 06/19/18 1000    Noemi Chapel, MD 06/19/18 1125

## 2018-06-16 NOTE — ED Notes (Signed)
ED TO INPATIENT HANDOFF REPORT  ED Nurse Name and Phone #: Odesser Tourangeau [phone]  S Name/Age/Gender Craig Bentley 63 y.o. male Room/Bed: 009C/009C  Code Status   Code Status: Prior  Home/SNF/Other [place]  Is this baseline? Yes   Triage Complete: Triage complete  Chief Complaint gen weakness  Triage Note Patient presents to the ED by EMS from [facility] center with c/o AMS by staff reporting that he was "not acting normal." LKW 12 noon yesterday 06/15/18. He feels weak and fatigued with no other complaints. Baseline is garbled speech with drooling.     Allergies No Known Allergies  Level of Care/Admitting Diagnosis ED Disposition    ED Disposition Condition Comment   Admit  Hospital Area: [place] [[id number]]  Level of Care: Progressive [102]  I expect the patient will be discharged within 24 hours: No (not a candidate for 5C-Observation unit)  Covid Evaluation: N/A  Diagnosis: Brain metastasis [facility]) [[id number]]  Admitting Physician: Rise Patience [phone]  Attending Physician: Rise Patience [email or web address]  PT Class (Do Not Modify): Observation [104]  PT Acc Code (Do Not Modify): Observation [10022]       B Medical/Surgery History Past Medical History:  Diagnosis Date  . Anxiety   . Constipation   . COPD (chronic obstructive pulmonary disease) ([place])   . Diabetes mellitus   . Fecal impaction ([place])   . Hypertension   . Pneumonia   . Schizophrenia, schizo-affective ([place])    Past Surgical History:  Procedure Laterality Date  . AMPUTATION Right 04/23/2015   Procedure: Right Fifth Toe Amputation;  Surgeon: Conrad Foresthill, MD;  Location: [place];  Service: Vascular;  Laterality: Right;  . AMPUTATION Right 08/27/2015   Procedure: RIGHT BELOW KNEE AMPUTATION ;  Surgeon: Newt Minion, MD;  Location: [place];  Service: Orthopedics;  Laterality: Right;  . AORTA - BILATERAL FEMORAL ARTERY BYPASS GRAFT Bilateral 04/23/2015   Procedure:  AORTOBIFEMORAL BYPASS GRAFT;  Surgeon: Conrad Wilton, MD;  Location: [place];  Service: Vascular;  Laterality: Bilateral;  . FEMORAL-FEMORAL BYPASS GRAFT Left 04/23/2015   Procedure:  Left Common Femoral Artery to Superficial Femoral Artery Bypass Graft with Vein;  Surgeon: Conrad Cumberland, MD;  Location: [place];  Service: Vascular;  Laterality: Left;  . FEMORAL-TIBIAL BYPASS GRAFT Right 04/23/2015   Procedure: Right Common Femoral to Posterior Tibial Bypass with Composite Vein Graft. ;  Surgeon: Conrad Keansburg, MD;  Location: [place];  Service: Vascular;  Laterality: Right;  . PERIPHERAL VASCULAR CATHETERIZATION N/A 04/07/2015   Procedure: Abdominal Aortogram;  Surgeon: Angelia Mould, MD;  Location: [place] CV [facility];  Service: Cardiovascular;  Laterality: N/A;     A IV Location/Drains/Wounds Patient Lines/Drains/Airways Status   Active Line/Drains/Airways    Name:   Placement date:   Placement time:   Site:   Days:   Peripheral IV 06/16/18 Left;Anterior Forearm   06/16/18    1444    Forearm   less than 1   External Urinary Catheter   08/22/15    2030    -   1029   Incision (Closed) 04/23/15 Groin Right   04/23/15    1928     1150   Incision (Closed) 04/23/15 Groin Left   04/23/15    1928     1150   Incision (Closed) 04/23/15 Leg Right   04/23/15    1928     1150   Incision (Closed) 04/23/15 Leg Left  04/23/15    1928     1150   Incision (Closed) 04/23/15 Abdomen Other (Comment)   04/23/15    1928     1150   Incision (Closed) 04/23/15 Foot Right   04/23/15    1954     1150   Incision (Closed) 08/27/15 Leg Right   08/27/15    1039     1024          Intake/Output Last 24 hours No intake or output data in the 24 hours ending 06/16/18 2133  Labs/Imaging Results for orders placed or performed during the hospital encounter of 06/16/18 (from the past 48 hour(s))  CBC with Differential     Status: Abnormal   Collection Time: 06/16/18  1:24 PM  Result Value Ref Range   WBC 10.2 4.0 - 10.5 K/uL    RBC 3.96 (L) 4.22 - 5.81 MIL/uL   Hemoglobin 9.4 (L) 13.0 - 17.0 g/dL   HCT 32.4 (L) 39.0 - 52.0 %   MCV 81.8 80.0 - 100.0 fL   MCH 23.7 (L) 26.0 - 34.0 pg   MCHC 29.0 (L) 30.0 - 36.0 g/dL   RDW 15.9 (H) 11.5 - 15.5 %   Platelets 409 (H) 150 - 400 K/uL   nRBC 0.0 0.0 - 0.2 %   Neutrophils Relative % 73 %   Neutro Abs 7.4 1.7 - 7.7 K/uL   Lymphocytes Relative 22 %   Lymphs Abs 2.3 0.7 - 4.0 K/uL   Monocytes Relative 5 %   Monocytes Absolute 0.6 0.1 - 1.0 K/uL   Eosinophils Relative 0 %   Eosinophils Absolute 0.0 0.0 - 0.5 K/uL   Basophils Relative 0 %   Basophils Absolute 0.0 0.0 - 0.1 K/uL   Immature Granulocytes 0 %   Abs Immature Granulocytes 0.04 0.00 - 0.07 K/uL    Comment: Performed at [facility], [address], [place], [place] [address]  Comprehensive metabolic panel     Status: Abnormal   Collection Time: 06/16/18  1:24 PM  Result Value Ref Range   Sodium 140 135 - 145 mmol/L   Potassium 3.9 3.5 - 5.1 mmol/L   Chloride 99 98 - 111 mmol/L   CO2 26 22 - 32 mmol/L   Glucose, Bld 151 (H) 70 - 99 mg/dL   BUN 10 8 - 23 mg/dL   Creatinine, Ser 0.61 0.61 - 1.24 mg/dL   Calcium 9.4 8.9 - 10.3 mg/dL   Total Protein 6.9 6.5 - 8.1 g/dL   Albumin 3.0 (L) 3.5 - 5.0 g/dL   AST 18 15 - 41 U/L   ALT 14 0 - 44 U/L   Alkaline Phosphatase 59 38 - 126 U/L   Total Bilirubin 0.4 0.3 - 1.2 mg/dL   GFR calc non Af Amer >60 >60 mL/min   GFR calc Af Amer >60 >60 mL/min   Anion gap 15 5 - 15    Comment: Performed at [facility] [address] [place], [place] [address]  POC CBG, ED     Status: Abnormal   Collection Time: 06/16/18  1:30 PM  Result Value Ref Range   Glucose-Capillary 152 (H) 70 - 99 mg/dL  Urinalysis, Routine w reflex microscopic     Status: Abnormal   Collection Time: 06/16/18  4:47 PM  Result Value Ref Range   Color, Urine YELLOW YELLOW   APPearance HAZY (A) CLEAR   Specific Gravity, Urine 1.017 1.005 - 1.030   pH 5.0  5.0 - 8.0   Glucose, UA  NEGATIVE NEGATIVE mg/dL   Hgb urine dipstick SMALL (A) NEGATIVE   Bilirubin Urine NEGATIVE NEGATIVE   Ketones, ur NEGATIVE NEGATIVE mg/dL   Protein, ur NEGATIVE NEGATIVE mg/dL   Nitrite NEGATIVE NEGATIVE   Leukocytes,Ua SMALL (A) NEGATIVE   RBC / HPF 0-5 0 - 5 RBC/hpf   WBC, UA 6-10 0 - 5 WBC/hpf   Bacteria, UA MANY (A) NONE SEEN   Squamous Epithelial / LPF 0-5 0 - 5   Mucus PRESENT     Comment: Performed at [facility], [place] [address], [place], [place] [address]  POC occult blood, ED     Status: None   Collection Time: 06/16/18  6:53 PM  Result Value Ref Range   Fecal Occult Bld NEGATIVE NEGATIVE  SARS Coronavirus 2 [facility] order, Performed in [place] hospital lab)     Status: None   Collection Time: 06/16/18  7:57 PM  Result Value Ref Range   SARS Coronavirus 2 NEGATIVE NEGATIVE    Comment: (NOTE) If result is NEGATIVE SARS-CoV-2 target nucleic acids are NOT DETECTED. The SARS-CoV-2 RNA is generally detectable in upper and lower  respiratory specimens during the acute phase of infection. The lowest  concentration of SARS-CoV-2 viral copies this assay can detect is 250  copies / mL. A negative result does not preclude SARS-CoV-2 infection  and should not be used as the sole basis for treatment or other  patient management decisions.  A negative result may occur with  improper specimen collection / handling, submission of specimen other  than nasopharyngeal swab, presence of viral mutation(s) within the  areas targeted by this assay, and inadequate number of viral copies  (<250 copies / mL). A negative result must be combined with clinical  observations, patient history, and epidemiological information. If result is POSITIVE SARS-CoV-2 target nucleic acids are DETECTED. The SARS-CoV-2 RNA is generally detectable in upper and lower  respiratory specimens dur ing the acute phase of infection.  Positive  results are indicative of active infection with SARS-CoV-2.   Clinical  correlation with patient history and other diagnostic information is  necessary to determine patient infection status.  Positive results do  not rule out bacterial infection or co-infection with other viruses. If result is PRESUMPTIVE POSTIVE SARS-CoV-2 nucleic acids MAY BE PRESENT.   A presumptive positive result was obtained on the submitted specimen  and confirmed on repeat testing.  While 2019 novel coronavirus  (SARS-CoV-2) nucleic acids may be present in the submitted sample  additional confirmatory testing may be necessary for epidemiological  and / or clinical management purposes  to differentiate between  SARS-CoV-2 and other Sarbecovirus currently known to infect humans.  If clinically indicated additional testing with an alternate test  methodology [phone]) is advised. The SARS-CoV-2 RNA is generally  detectable in upper and lower respiratory sp ecimens during the acute  phase of infection. The expected result is Negative. Fact Sheet for Patients:  StrictlyIdeas.no Fact Sheet for Healthcare Providers: BankingDealers.co.za This test is not yet approved or cleared by the [place] FDA and has been authorized for detection and/or diagnosis of SARS-CoV-2 by FDA under an Emergency Use Authorization (EUA).  This EUA will remain in effect (meaning this test can be used) for the duration of the COVID-19 declaration under Section 564(b)(1) of the Act, 21 U.S.C. section 360bbb-3(b)(1), unless the authorization is terminated or revoked sooner. Performed at [facility], [place],  [place] [address]    Ct Head Wo Contrast  Result Date: 06/16/2018 CLINICAL DATA:  63 year old with encephalopathy. Altered mental status. EXAM: CT HEAD WITHOUT CONTRAST TECHNIQUE: Contiguous axial images were obtained from the base of the skull through the vertex without intravenous contrast. COMPARISON:  01/27/2018 FINDINGS:  Brain: Extensive vasogenic edema in the left temporal lobe, left occipital and left parietal lobes. There is some sulci effacement in the left cerebrum particularly along the posterior aspect. No evidence for acute hemorrhage or midline shift. Negative for hydronephrosis. Mass lesion is not confidently identified but could be associated with the vasogenic edema. Mild effacement of the left frontal horn. Vascular: No hyperdense vessel or unexpected calcification. Skull: Normal. Negative for fracture or focal lesion. Sinuses/Orbits: No acute finding. Other: None. IMPRESSION: 1. Vasogenic edema involving the left temporal lobe, left occipital lobe and left parietal lobe. Findings raise concern for an underlying lesion such as infection or neoplasm. Recommend further characterization with an MRI, with and without contrast. 2. Mild sulci effacement in the left cerebral hemisphere without significant mass effect. 3. Negative for acute hemorrhage.  No significant midline shift. These results were called by telephone at the time of interpretation on 06/16/2018 at 4:07 pm to [facility] , who verbally acknowledged these results. Electronically Signed   By: Markus Daft M.D.   On: 06/16/2018 16:09   Mr Brain W And Wo Contrast  Result Date: 06/16/2018 CLINICAL DATA:  Encephalopathy with altered mental status. EXAM: MRI HEAD WITHOUT AND WITH CONTRAST TECHNIQUE: Multiplanar, multiecho pulse sequences of the brain and surrounding structures were obtained without and with intravenous contrast. CONTRAST:  Gadavist 7 mL. COMPARISON:  CT head earlier today. FINDINGS: Brain: There is a large intra-axial posterior LEFT temporal mass, 23 x 27 x 19 mm. Extensive vasogenic edema. No uncal herniation. No midline shift. The lesion is T2 hypointense, T1 hyperintense, marked susceptibility on gradient sequence, and avid postcontrast enhancement. Some areas of central heterogeneity could represent necrosis. A second lesion, 5 mm, is  identified, LEFT inferior frontal lobe near the gyrus rectus. Lesion is too small to accurately characterize except as that of a probable metastasis. Generalized atrophy.  Minor white matter disease. Vascular: Normal flow voids. Skull and upper cervical spine: Normal marrow signal. Sinuses/Orbits: Negative. Other: None. IMPRESSION: Two separate enhancing lesions in the brain, with the dominant lesion in the LEFT posterior temporal lobe, and a subcentimeter lesion in the LEFT frontal lobe, are strongly suggestive of intracranial metastatic disease, unknown primary. Moderate vasogenic edema, but no uncal herniation or midline shift. Imaging characteristics of the larger lesion, with T1 shortening, T2 shortening, and susceptibility on gradient sequence suggest either a metastatic hemorrhagic neoplasm or metastatic melanoma. Electronically Signed   By: Staci Righter M.D.   On: 06/16/2018 18:47   Dg Chest Portable 1 View  Result Date: 06/16/2018 CLINICAL DATA:  Altered mental status. EXAM: PORTABLE CHEST 1 VIEW COMPARISON:  August 09, 2017 FINDINGS: There is consolidation in a portion of the medial left base. There is a minimal left pleural effusion. Lungs elsewhere are clear. Heart size and pulmonary vascularity are normal. No adenopathy. There is aortic atherosclerosis. No bone lesions. IMPRESSION: Medial left base consolidation, likely pneumonia. Small left pleural effusion. Lungs elsewhere clear. Stable cardiac silhouette. Aortic Atherosclerosis (ICD10-I70.0). Followup PA and lateral chest radiographs recommended in 3-4 weeks following trial of antibiotic therapy to ensure resolution and exclude underlying malignancy. Electronically Signed   By: Lowella Grip III M.D.   On: 06/16/2018 13:25  Pending Labs Unresulted Labs (From admission, onward)    Start     Ordered   06/16/18 2123  Respiratory Panel by PCR  (Respiratory virus panel with precautions)  Once,   R     06/16/18 2122   06/16/18 1906  Urine  culture  ONCE - STAT,   STAT     06/16/18 1905          Vitals/Pain Today's Vitals   06/16/18 1501 06/16/18 1630 06/16/18 1645 06/16/18 1700  BP: 100/60 (!) 143/100 (!) 154/81 (!) 147/77  Pulse: 89 100 (!) 102 95  Resp: (!) 26 20 17 13   Temp:      TempSrc:      SpO2: 91% 98% 95% 100%  Weight:      Height:      PainSc:        Isolation Precautions Droplet precaution  Medications Medications  doxycycline (VIBRA-TABS) tablet 100 mg (100 mg Oral Given 06/16/18 1459)  gadobutrol (GADAVIST) 1 MMOL/ML injection 7 mL (7 mLs Intravenous Contrast Given 06/16/18 1816)    Mobility walks with device High fall risk   Focused Assessments       Neuro Assessment: Exceptions to WDL      R Recommendations: See Admitting Provider Note  Report given to:   Additional Notes:

## 2018-06-16 NOTE — ED Notes (Signed)
Pt verbalizes understanding of needing UA sample.

## 2018-06-16 NOTE — Progress Notes (Signed)
Patient ID: Craig Bentley, male   DOB: 01-25-1956, 63 y.o.   MRN: [id number] We were asked to review the imaging on this 63 year old gentleman from a group home with multiple medical problems who presented to the emergency department "not acting right" for a day.  MRI shows 2 enhancing lesions, 1 in the dominant posterior deep temporal lobe and one in the inferior frontal lobe on the left.  This is most consistent with metastatic disease.  I would recommend admission to the medical service for metastatic work-up.  Neither lesion is overly accessible for surgery.  The left inferior frontal lesion is subcentimeter and the left temporal lesion is not on the surface and is in the posterior dominant temporal lobe.  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  Recommend metastatic work-up and then hopefully achieving diagnosis through biopsy of potential primary lesion if found.  Recommend medical and radiation oncology consults.  There is some edema around the left temporal lesion and therefore Decadron may be helpful if medical service feels that is appropriate.  Dosing per the medical service.

## 2018-06-16 NOTE — ED Notes (Signed)
Attempted IV start. Could not gain access. Putting in for IV consult.

## 2018-06-17 DIAGNOSIS — F17210 Nicotine dependence, cigarettes, uncomplicated: Secondary | ICD-10-CM | POA: Diagnosis present

## 2018-06-17 DIAGNOSIS — I10 Essential (primary) hypertension: Secondary | ICD-10-CM | POA: Diagnosis present

## 2018-06-17 DIAGNOSIS — D649 Anemia, unspecified: Secondary | ICD-10-CM | POA: Diagnosis present

## 2018-06-17 DIAGNOSIS — Z515 Encounter for palliative care: Secondary | ICD-10-CM | POA: Diagnosis not present

## 2018-06-17 DIAGNOSIS — K5900 Constipation, unspecified: Secondary | ICD-10-CM | POA: Diagnosis present

## 2018-06-17 DIAGNOSIS — F0390 Unspecified dementia without behavioral disturbance: Secondary | ICD-10-CM | POA: Diagnosis present

## 2018-06-17 DIAGNOSIS — B961 Klebsiella pneumoniae [K. pneumoniae] as the cause of diseases classified elsewhere: Secondary | ICD-10-CM | POA: Diagnosis present

## 2018-06-17 DIAGNOSIS — G936 Cerebral edema: Secondary | ICD-10-CM | POA: Diagnosis present

## 2018-06-17 DIAGNOSIS — Z66 Do not resuscitate: Secondary | ICD-10-CM | POA: Diagnosis not present

## 2018-06-17 DIAGNOSIS — R4182 Altered mental status, unspecified: Secondary | ICD-10-CM | POA: Diagnosis not present

## 2018-06-17 DIAGNOSIS — C7931 Secondary malignant neoplasm of brain: Principal | ICD-10-CM | POA: Diagnosis present

## 2018-06-17 DIAGNOSIS — E1149 Type 2 diabetes mellitus with other diabetic neurological complication: Secondary | ICD-10-CM | POA: Diagnosis present

## 2018-06-17 DIAGNOSIS — Z95828 Presence of other vascular implants and grafts: Secondary | ICD-10-CM | POA: Diagnosis not present

## 2018-06-17 DIAGNOSIS — C3432 Malignant neoplasm of lower lobe, left bronchus or lung: Secondary | ICD-10-CM | POA: Diagnosis present

## 2018-06-17 DIAGNOSIS — E785 Hyperlipidemia, unspecified: Secondary | ICD-10-CM | POA: Diagnosis present

## 2018-06-17 DIAGNOSIS — F200 Paranoid schizophrenia: Secondary | ICD-10-CM | POA: Diagnosis not present

## 2018-06-17 DIAGNOSIS — E1165 Type 2 diabetes mellitus with hyperglycemia: Secondary | ICD-10-CM | POA: Diagnosis present

## 2018-06-17 DIAGNOSIS — Z20828 Contact with and (suspected) exposure to other viral communicable diseases: Secondary | ICD-10-CM | POA: Diagnosis present

## 2018-06-17 DIAGNOSIS — E1151 Type 2 diabetes mellitus with diabetic peripheral angiopathy without gangrene: Secondary | ICD-10-CM | POA: Diagnosis present

## 2018-06-17 DIAGNOSIS — F419 Anxiety disorder, unspecified: Secondary | ICD-10-CM | POA: Diagnosis present

## 2018-06-17 DIAGNOSIS — E278 Other specified disorders of adrenal gland: Secondary | ICD-10-CM | POA: Diagnosis present

## 2018-06-17 DIAGNOSIS — Z89511 Acquired absence of right leg below knee: Secondary | ICD-10-CM | POA: Diagnosis not present

## 2018-06-17 DIAGNOSIS — I700 Atherosclerosis of aorta: Secondary | ICD-10-CM | POA: Diagnosis present

## 2018-06-17 DIAGNOSIS — J189 Pneumonia, unspecified organism: Secondary | ICD-10-CM | POA: Diagnosis not present

## 2018-06-17 DIAGNOSIS — N390 Urinary tract infection, site not specified: Secondary | ICD-10-CM | POA: Diagnosis present

## 2018-06-17 DIAGNOSIS — J449 Chronic obstructive pulmonary disease, unspecified: Secondary | ICD-10-CM | POA: Diagnosis not present

## 2018-06-17 LAB — GLUCOSE, CAPILLARY
Glucose-Capillary: 120 mg/dL — ABNORMAL HIGH (ref 70–99)
Glucose-Capillary: 121 mg/dL — ABNORMAL HIGH (ref 70–99)
Glucose-Capillary: 155 mg/dL — ABNORMAL HIGH (ref 70–99)
Glucose-Capillary: 158 mg/dL — ABNORMAL HIGH (ref 70–99)
Glucose-Capillary: 204 mg/dL — ABNORMAL HIGH (ref 70–99)
Glucose-Capillary: 236 mg/dL — ABNORMAL HIGH (ref 70–99)
Glucose-Capillary: 230 mg/dL — ABNORMAL HIGH (ref 70–99)

## 2018-06-17 LAB — CBC WITH DIFFERENTIAL/PLATELET
Abs Immature Granulocytes: 0.04 10*3/uL (ref 0.00–0.07)
Basophils Absolute: 0 10*3/uL (ref 0.0–0.1)
Basophils Relative: 0 %
Eosinophils Absolute: 0 10*3/uL (ref 0.0–0.5)
Eosinophils Relative: 0 %
HCT: 31.2 % — ABNORMAL LOW (ref 39.0–52.0)
Hemoglobin: 9.1 g/dL — ABNORMAL LOW (ref 13.0–17.0)
Immature Granulocytes: 0 %
Lymphocytes Relative: 21 %
Lymphs Abs: 2.1 10*3/uL (ref 0.7–4.0)
MCH: 23.4 pg — ABNORMAL LOW (ref 26.0–34.0)
MCHC: 29.2 g/dL — ABNORMAL LOW (ref 30.0–36.0)
MCV: 80.2 fL (ref 80.0–100.0)
Monocytes Absolute: 0.8 10*3/uL (ref 0.1–1.0)
Monocytes Relative: 8 %
Neutro Abs: 7.3 10*3/uL (ref 1.7–7.7)
Neutrophils Relative %: 71 %
Platelets: 381 10*3/uL (ref 150–400)
RBC: 3.89 MIL/uL — ABNORMAL LOW (ref 4.22–5.81)
RDW: 15.9 % — ABNORMAL HIGH (ref 11.5–15.5)
WBC: 10.2 10*3/uL (ref 4.0–10.5)
nRBC: 0 % (ref 0.0–0.2)

## 2018-06-17 LAB — RESPIRATORY PANEL BY PCR

## 2018-06-17 LAB — BASIC METABOLIC PANEL
Anion gap: 10 (ref 5–15)
BUN: 12 mg/dL (ref 8–23)
CO2: 29 mmol/L (ref 22–32)
Calcium: 9.3 mg/dL (ref 8.9–10.3)
Chloride: 102 mmol/L (ref 98–111)
Creatinine, Ser: 0.72 mg/dL (ref 0.61–1.24)
GFR calc Af Amer: >60 mL/min (ref >60)
GFR calc non Af Amer: >60 mL/min (ref >60)
Glucose, Bld: 116 mg/dL — ABNORMAL HIGH (ref 70–99)
Potassium: 3.6 mmol/L (ref 3.5–5.1)
Sodium: 141 mmol/L (ref 135–145)

## 2018-06-17 LAB — STREP PNEUMONIAE URINARY ANTIGEN: Strep Pneumo Urinary Antigen: NEGATIVE

## 2018-06-17 LAB — MRSA PCR SCREENING: MRSA by PCR: NEGATIVE

## 2018-06-17 LAB — HEPATIC FUNCTION PANEL
ALT: 12 U/L (ref 0–44)
AST: 13 U/L — ABNORMAL LOW (ref 15–41)
Albumin: 2.7 g/dL — ABNORMAL LOW (ref 3.5–5.0)
Alkaline Phosphatase: 58 U/L (ref 38–126)
Bilirubin, Direct: <0.1 mg/dL (ref 0.0–0.2)
Total Bilirubin: 0.5 mg/dL (ref 0.3–1.2)
Total Protein: 6.4 g/dL — ABNORMAL LOW (ref 6.5–8.1)

## 2018-06-17 LAB — HIV ANTIBODY (ROUTINE TESTING W REFLEX): HIV Screen 4th Generation wRfx: NONREACTIVE

## 2018-06-17 MED ORDER — DEXAMETHASONE SODIUM PHOSPHATE 10 MG/ML IJ SOLN
4.0000 mg | Freq: Four times a day (QID) | INTRAMUSCULAR | Status: DC
  Administered 2018-06-17 – 2018-06-19 (×8): 4 mg via INTRAVENOUS
  Filled 2018-06-17 (×8): qty 1

## 2018-06-17 NOTE — Consult Note (Signed)
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 In the ED exam showed normal vitals but frequent nonproductive cough. He had drooling and garbled speech but that was reported as his baseline per [facility] staff. CXR suggested possible LLL PNA and non contrast head CT showed left brain edema. Accordingly brain MRI was obtained, which shows a 2.7 cm left temporal mass and a second 0.5 cm left frontal mass, consistent with metastases. CT scans of the C/A/P showed a 6.9 LLL mass consistent with a lung primary. There were bilateral adrenal masses but no liver or bone involvement.  We were concerned re further evaluation and management. .  INTERVAL HISTORY: I met with Mr Simkin in his hospital room 06/17/2018. No family was present.  REVIEW OF SYSTEMS: Patient tells me he "has been coughing." Admits to ongoing tobacco abuse. Denies h/a, visual changes, N/V. Feels weak "all over" but unable to elicit focal weakness symptoms. ROS otherwise noncontributory  PAST MEDICAL HISTORY: Past Medical History:  Diagnosis Date   Anxiety    Constipation    COPD (chronic obstructive pulmonary disease) (HCC)    Diabetes mellitus    Fecal impaction ([place])    Hypertension    Pneumonia    Schizophrenia, schizo-affective ([place])     PAST SURGICAL HISTORY: Past Surgical History:  Procedure Laterality Date   AMPUTATION Right 04/23/2015   Procedure: Right Fifth Toe Amputation;  Surgeon: Conrad Orestes, MD;  Location: [place];  Service:  Vascular;  Laterality: Right;   AMPUTATION Right 08/27/2015   Procedure: RIGHT BELOW KNEE AMPUTATION ;  Surgeon: Newt Minion, MD;  Location: [place];  Service: Orthopedics;  Laterality: Right;   AORTA - BILATERAL FEMORAL ARTERY BYPASS GRAFT Bilateral 04/23/2015   Procedure: AORTOBIFEMORAL BYPASS GRAFT;  Surgeon: Conrad Tavernier, MD;  Location: [place];  Service: Vascular;  Laterality: Bilateral;   FEMORAL-FEMORAL BYPASS GRAFT Left 04/23/2015   Procedure:  Left Common Femoral Artery to Superficial Femoral Artery Bypass Graft with Vein;  Surgeon: Conrad Bellflower, MD;  Location: [place];  Service: Vascular;  Laterality: Left;   FEMORAL-TIBIAL BYPASS GRAFT Right 04/23/2015   Procedure: Right Common Femoral to Posterior Tibial Bypass with Composite Vein Graft. ;  Surgeon: Conrad Chaseburg, MD;  Location: [place];  Service: Vascular;  Laterality: Right;   PERIPHERAL VASCULAR CATHETERIZATION N/A 04/07/2015   Procedure: Abdominal Aortogram;  Surgeon: Angelia Mould, MD;  Location: [facility];  Service: Cardiovascular;  Laterality: N/A;    FAMILY HISTORY Family History  Problem Relation Age of Onset   Mental illness Mother      SOCIAL HISTORY:  Patient tells me he never married and has no children. He has brothers and sisters but "we don't get along." Asked who helps him out when he needs help he says "I help myself."-- He is very insistent that "the man in the sky" has told him he needs to move to "a different place," apparently someone's home, but I was not abe to clarify this further.  ADVANCED DIRECTIVES: he is currently a full code   HEALTH MAINTENANCE: Social History   Tobacco Use   Smoking status: Current Every Day Smoker    Packs/day: 1.50    Years: 50.00    Pack years: 75.00    Types: Cigarettes   Smokeless tobacco: Never Used  Substance Use Topics   Alcohol use: No    Alcohol/week: 0.0 standard drinks   Drug use: No      No Known Allergies  Current  Facility-Administered Medications  Medication Dose Route Frequency Provider Last Rate Last Dose   0.9 %  sodium chloride infusion   Intravenous Continuous Rise Patience, MD 75 mL/hr at 06/17/18 0257     acetaminophen (TYLENOL) tablet 650 mg  650 mg Oral Q6H PRN Rise Patience, MD       Or   acetaminophen (TYLENOL) suppository 650 mg  650 mg Rectal Q6H PRN Rise Patience, MD       azithromycin (ZITHROMAX) 500 mg in sodium chloride 0.9 % 250 mL IVPB  500 mg Intravenous Q24H Rise Patience, MD       cefTRIAXone (ROCEPHIN) 1 g in sodium chloride 0.9 % 100 mL IVPB  1 g Intravenous Q24H Rise Patience, MD       clonazePAM [facility] [place]) tablet 1 mg  1 mg Oral TID Rise Patience, MD   1 mg at 06/17/18 1024   cloZAPine (CLOZARIL) tablet 200 mg  200 mg Oral BID Rise Patience, MD   200 mg at 06/17/18 1025   dexamethasone (DECADRON) injection 4 mg  4 mg Intravenous Q6H Rise Patience, MD   4 mg at 06/17/18 1236   donepezil (ARICEPT) tablet 10 mg  10 mg Oral QHS Rise Patience, MD   10 mg at 06/17/18 0003   gabapentin (NEURONTIN) capsule 300 mg  300 mg Oral TID Rise Patience, MD   300 mg at 06/17/18 1024   haloperidol (HALDOL) tablet 5 mg  5 mg Oral BID Rise Patience, MD   5 mg at 06/17/18 1025   insulin aspart (novoLOG) injection 0-9 Units  0-9 Units Subcutaneous Q4H Rise Patience, MD   2 Units at 06/17/18 1237   lactulose (CHRONULAC) 10 GM/15ML solution 30 g  30 g Oral BID Rise Patience, MD   30 g at 06/17/18 1024   lisinopril (ZESTRIL) tablet 5 mg  5 mg Oral Daily Rise Patience, MD   5 mg at 06/17/18 1024   metoprolol tartrate (LOPRESSOR) tablet 25 mg  25 mg Oral BID Rise Patience, MD   25 mg at 06/17/18 1024   ondansetron (ZOFRAN) tablet 4 mg  4 mg Oral Q6H PRN Rise Patience, MD       Or   ondansetron [facility]) injection 4 mg  4 mg Intravenous Q6H PRN Rise Patience, MD        pantoprazole (PROTONIX) EC tablet 40 mg  40 mg Oral Daily Rise Patience, MD   40 mg at 06/17/18 1024   senna-docusate (Senokot-S) tablet 2 tablet  2 tablet Oral QHS Rise Patience, MD   2 tablet at 06/17/18 0004   simvastatin (ZOCOR) tablet 5 mg  5 mg Oral QHS Rise Patience, MD   5 mg at 06/17/18 0004   trihexyphenidyl (ARTANE) tablet 5 mg  5 mg Oral BID Rise Patience, MD   5 mg at 06/17/18 1025    OBJECTIVE: middle aged [place] American  man who appears older than stated age, examined in bed  COVID distancing  precautions preserved  Orientation: location is "[place];" President is "Eulas Post," on second try "Trump, Sharyn Dross;" Year is "2022"  Vitals:   06/17/18 1300 06/17/18 1547  BP:  (!) 149/71  Pulse:  84  Resp:  (!) 24  Temp: 99.3 F (37.4 C) 99.2 F (37.3 C)  SpO2:  91%     Body mass index is 25.85 kg/m.   Wt Readings from Last 3 Encounters:  06/17/18 169 lb 15.6 oz (77.1 kg)  04/25/16 160 lb (72.6 kg)  03/28/16 160 lb (72.6 kg)      ECOG FS:3 - Symptomatic, >50% confined to bed  LAB RESULTS:  CMP     Component Value Date/Time   NA 141 06/17/2018 0440   K 3.6 06/17/2018 0440   CL 102 06/17/2018 0440   CO2 29 06/17/2018 0440   GLUCOSE 116 (H) 06/17/2018 0440   BUN 12 06/17/2018 0440   CREATININE 0.72 06/17/2018 0440   CALCIUM 9.3 06/17/2018 0440   PROT 6.4 (L) 06/17/2018 0440   ALBUMIN 2.7 (L) 06/17/2018 0440   AST 13 (L) 06/17/2018 0440   ALT 12 06/17/2018 0440   ALKPHOS 58 06/17/2018 0440   BILITOT 0.5 06/17/2018 0440   GFRNONAA >60 06/17/2018 0440   GFRAA >60 06/17/2018 0440    No results found for: TOTALPROTELP, ALBUMINELP, A1GS, A2GS, BETS, BETA2SER, GAMS, MSPIKE, SPEI  No results found for: KPAFRELGTCHN, LAMBDASER, KAPLAMBRATIO  Lab Results  Component Value Date   WBC 10.2 06/17/2018   NEUTROABS 7.3 06/17/2018   HGB 9.1 (L) 06/17/2018   HCT 31.2 (L) 06/17/2018   MCV 80.2 06/17/2018   PLT 381 06/17/2018     _0 @  No results found for: LABCA2  No components found for: DXIPJA250  No results for input(s): INR in the last 168 hours.  No results found for: LABCA2  No results found for: NLZ767  No results found for: HAL937  No results found for: TKW409  No results found for: CA2729  No components found for: HGQUANT  No results found for: CEA1 / No results found for: CEA1   No results found for: AFPTUMOR  No results found for: CHROMOGRNA  No results found for: PSA1  Admission on 06/16/2018  Component Date Value Ref Range Status   Glucose-Capillary 06/16/2018 152* 70 - 99 mg/dL Final   WBC 06/16/2018 10.2  4.0 - 10.5 K/uL Final   RBC 06/16/2018 3.96* 4.22 - 5.81 MIL/uL Final   Hemoglobin 06/16/2018 9.4* 13.0 - 17.0 g/dL Final   HCT 06/16/2018 32.4* 39.0 - 52.0 % Final   MCV 06/16/2018 81.8  80.0 - 100.0 fL Final   MCH 06/16/2018 23.7* 26.0 - 34.0 pg Final   MCHC 06/16/2018 29.0* 30.0 - 36.0 g/dL Final   RDW 06/16/2018 15.9* 11.5 - 15.5 % Final   Platelets 06/16/2018 409* 150 - 400 K/uL Final   nRBC 06/16/2018 0.0  0.0 - 0.2 % Final   Neutrophils Relative % 06/16/2018 73  % Final   Neutro Abs 06/16/2018 7.4  1.7 - 7.7 K/uL Final   Lymphocytes Relative 06/16/2018 22  % Final   Lymphs Abs 06/16/2018 2.3  0.7 - 4.0 K/uL Final   Monocytes Relative 06/16/2018 5  % Final   Monocytes Absolute 06/16/2018 0.6  0.1 - 1.0 K/uL Final   Eosinophils Relative 06/16/2018 0  % Final   Eosinophils Absolute 06/16/2018 0.0  0.0 - 0.5 K/uL Final  Basophils Relative 06/16/2018 0  % Final   Basophils Absolute 06/16/2018 0.0  0.0 - 0.1 K/uL Final   Immature Granulocytes 06/16/2018 0  % Final   Abs Immature Granulocytes 06/16/2018 0.04  0.00 - 0.07 K/uL Final   Performed at [facility], [place] [address], [place], [place] [address]   Sodium 06/16/2018 140  135 - 145 mmol/L Final   Potassium 06/16/2018 3.9  3.5 - 5.1 mmol/L Final   Chloride 06/16/2018  99  98 - 111 mmol/L Final   CO2 06/16/2018 26  22 - 32 mmol/L Final   Glucose, Bld 06/16/2018 151* 70 - 99 mg/dL Final   BUN 06/16/2018 10  8 - 23 mg/dL Final   Creatinine, Ser 06/16/2018 0.61  0.61 - 1.24 mg/dL Final   Calcium 06/16/2018 9.4  8.9 - 10.3 mg/dL Final   Total Protein 06/16/2018 6.9  6.5 - 8.1 g/dL Final   Albumin 06/16/2018 3.0* 3.5 - 5.0 g/dL Final   AST 06/16/2018 18  15 - 41 U/L Final   ALT 06/16/2018 14  0 - 44 U/L Final   Alkaline Phosphatase 06/16/2018 59  38 - 126 U/L Final   Total Bilirubin 06/16/2018 0.4  0.3 - 1.2 mg/dL Final   GFR calc non Af Amer 06/16/2018 >60  >60 mL/min Final   GFR calc Af Amer 06/16/2018 >60  >60 mL/min Final   Anion gap 06/16/2018 15  5 - 15 Final   Performed at [facility], [place] [address], [place], [place] [address]   Fecal Occult Bld 06/16/2018 NEGATIVE  NEGATIVE Final   Color, Urine 06/16/2018 YELLOW  YELLOW Final   APPearance 06/16/2018 HAZY* CLEAR Final   Specific Gravity, Urine 06/16/2018 1.017  1.005 - 1.030 Final   pH 06/16/2018 5.0  5.0 - 8.0 Final   Glucose, UA 06/16/2018 NEGATIVE  NEGATIVE mg/dL Final   Hgb urine dipstick 06/16/2018 SMALL* NEGATIVE Final   Bilirubin Urine 06/16/2018 NEGATIVE  NEGATIVE Final   Ketones, ur 06/16/2018 NEGATIVE  NEGATIVE mg/dL Final   Protein, ur 06/16/2018 NEGATIVE  NEGATIVE mg/dL Final   Nitrite 06/16/2018 NEGATIVE  NEGATIVE Final   Leukocytes,Ua 06/16/2018 SMALL* NEGATIVE Final   RBC / HPF 06/16/2018 0-5  0 - 5 RBC/hpf Final   WBC, UA 06/16/2018 6-10  0 - 5 WBC/hpf Final   Bacteria, UA 06/16/2018 MANY* NONE SEEN Final   Squamous Epithelial / LPF 06/16/2018 0-5  0 - 5 Final   Mucus 06/16/2018 PRESENT   Final   Performed at [facility], [place] [address] [place], [place] [address]   Specimen Description 06/16/2018 URINE, RANDOM   Final   Special Requests 06/16/2018    Final                   Value:NONE Performed at [facility], [place] [address], [place], [place] [address]    Culture 06/16/2018 >=100,000 COLONIES/mL GRAM NEGATIVE RODS*  Final   Report Status 06/16/2018 PENDING   Incomplete   SARS Coronavirus 2 06/16/2018 NEGATIVE  NEGATIVE Final   Comment: (NOTE) If result is NEGATIVE SARS-CoV-2 target nucleic acids are NOT DETECTED. The SARS-CoV-2 RNA is generally detectable in upper and lower  respiratory specimens during the acute phase of infection. The lowest  concentration of SARS-CoV-2 viral copies this assay can detect is 250  copies / mL. A negative result does not preclude SARS-CoV-2 infection  and should not be used as the sole basis for  treatment or other  patient management decisions.  A negative result may occur with  improper specimen collection / handling, submission of specimen other  than nasopharyngeal swab, presence of viral mutation(s) within the  areas targeted by this assay, and inadequate number of viral copies  (<250 copies / mL). A negative result must be combined with clinical  observations, patient history, and epidemiological information. If result is POSITIVE SARS-CoV-2 target nucleic acids are DETECTED. The SARS-CoV-2 RNA is generally detectable in upper and lower  respiratory specimens dur                          ing the acute phase of infection.  Positive  results are indicative of active infection with SARS-CoV-2.  Clinical  correlation with patient history and other diagnostic information is  necessary to determine patient infection status.  Positive results do  not rule out bacterial infection or co-infection with other viruses. If result is PRESUMPTIVE POSTIVE SARS-CoV-2 nucleic acids MAY BE PRESENT.   A presumptive positive result was obtained on the submitted specimen  and confirmed on repeat testing.  While 2019 novel coronavirus  (SARS-CoV-2) nucleic acids may be present in the submitted sample  additional confirmatory testing may be necessary for epidemiological  and / or  clinical management purposes  to differentiate between  SARS-CoV-2 and other Sarbecovirus currently known to infect humans.  If clinically indicated additional testing with an alternate test  methodology [phone]) is advised. The SARS-CoV-2 RNA is generally  detectable in upper and lower respiratory sp                          ecimens during the acute  phase of infection. The expected result is Negative. Fact Sheet for Patients:  StrictlyIdeas.no Fact Sheet for Healthcare Providers: BankingDealers.co.za This test is not yet approved or cleared by the [place] FDA and has been authorized for detection and/or diagnosis of SARS-CoV-2 by FDA under an Emergency Use Authorization (EUA).  This EUA will remain in effect (meaning this test can be used) for the duration of the COVID-19 declaration under Section 564(b)(1) of the Act, 21 U.S.C. section 360bbb-3(b)(1), unless the authorization is terminated or revoked sooner. Performed at [facility], [place] [address] [place], [place] [address]    Adenovirus 06/16/2018 NOT DETECTED  NOT DETECTED Final   Coronavirus 229E 06/16/2018 NOT DETECTED  NOT DETECTED Final   Comment: (NOTE) The Coronavirus on the Respiratory Panel, DOES NOT test for the novel  Coronavirus (2019 nCoV)    Coronavirus HKU1 06/16/2018 NOT DETECTED  NOT DETECTED Final   Coronavirus NL63 06/16/2018 NOT DETECTED  NOT DETECTED Final   Coronavirus OC43 06/16/2018 NOT DETECTED  NOT DETECTED Final   Metapneumovirus 06/16/2018 NOT DETECTED  NOT DETECTED Final   Rhinovirus / Enterovirus 06/16/2018 NOT DETECTED  NOT DETECTED Final   Influenza A 06/16/2018 NOT DETECTED  NOT DETECTED Final   Influenza B 06/16/2018 NOT DETECTED  NOT DETECTED Final   Parainfluenza Virus 1 06/16/2018 NOT DETECTED  NOT DETECTED Final   Parainfluenza Virus 2 06/16/2018 NOT DETECTED  NOT DETECTED Final   Parainfluenza Virus 3 06/16/2018 NOT  DETECTED  NOT DETECTED Final   Parainfluenza Virus 4 06/16/2018 NOT DETECTED  NOT DETECTED Final   Respiratory Syncytial Virus 06/16/2018 NOT DETECTED  NOT DETECTED Final   Bordetella pertussis 06/16/2018 NOT DETECTED  NOT DETECTED Final   Chlamydophila pneumoniae 06/16/2018 NOT  DETECTED  NOT DETECTED Final   Mycoplasma pneumoniae 06/16/2018 NOT DETECTED  NOT DETECTED Final   Performed at [facility], [place] [address] [place], [place] [address]   HIV Screen 4th Generation wRfx 06/17/2018 Non Reactive  Non Reactive Final   Comment: (NOTE) Performed At: [facility] [place], [place] [id number] Rush Farmer MD TU:[id number]    Sodium 06/17/2018 141  135 - 145 mmol/L Final   Potassium 06/17/2018 3.6  3.5 - 5.1 mmol/L Final   Chloride 06/17/2018 102  98 - 111 mmol/L Final   CO2 06/17/2018 29  22 - 32 mmol/L Final   Glucose, Bld 06/17/2018 116* 70 - 99 mg/dL Final   BUN 06/17/2018 12  8 - 23 mg/dL Final   Creatinine, Ser 06/17/2018 0.72  0.61 - 1.24 mg/dL Final   Calcium 06/17/2018 9.3  8.9 - 10.3 mg/dL Final   GFR calc non Af Amer 06/17/2018 >60  >60 mL/min Final   GFR calc Af Amer 06/17/2018 >60  >60 mL/min Final   Anion gap 06/17/2018 10  5 - 15 Final   Performed at [facility], [place] [address], [place], [place] [address]   Total Protein 06/17/2018 6.4* 6.5 - 8.1 g/dL Final   Albumin 06/17/2018 2.7* 3.5 - 5.0 g/dL Final   AST 06/17/2018 13* 15 - 41 U/L Final   ALT 06/17/2018 12  0 - 44 U/L Final   Alkaline Phosphatase 06/17/2018 58  38 - 126 U/L Final   Total Bilirubin 06/17/2018 0.5  0.3 - 1.2 mg/dL Final   Bilirubin, Direct 06/17/2018 <0.1  0.0 - 0.2 mg/dL Final   Indirect Bilirubin 06/17/2018 NOT CALCULATED  0.3 - 0.9 mg/dL Final   Performed at [facility] [address], [place], [place] [address]   WBC 06/17/2018 10.2  4.0 - 10.5 K/uL Final   RBC 06/17/2018 3.89* 4.22 - 5.81 MIL/uL Final   Hemoglobin  06/17/2018 9.1* 13.0 - 17.0 g/dL Final   HCT 06/17/2018 31.2* 39.0 - 52.0 % Final   MCV 06/17/2018 80.2  80.0 - 100.0 fL Final   MCH 06/17/2018 23.4* 26.0 - 34.0 pg Final   MCHC 06/17/2018 29.2* 30.0 - 36.0 g/dL Final   RDW 06/17/2018 15.9* 11.5 - 15.5 % Final   Platelets 06/17/2018 381  150 - 400 K/uL Final   nRBC 06/17/2018 0.0  0.0 - 0.2 % Final   Neutrophils Relative % 06/17/2018 71  % Final   Neutro Abs 06/17/2018 7.3  1.7 - 7.7 K/uL Final   Lymphocytes Relative 06/17/2018 21  % Final   Lymphs Abs 06/17/2018 2.1  0.7 - 4.0 K/uL Final   Monocytes Relative 06/17/2018 8  % Final   Monocytes Absolute 06/17/2018 0.8  0.1 - 1.0 K/uL Final   Eosinophils Relative 06/17/2018 0  % Final   Eosinophils Absolute 06/17/2018 0.0  0.0 - 0.5 K/uL Final   Basophils Relative 06/17/2018 0  % Final   Basophils Absolute 06/17/2018 0.0  0.0 - 0.1 K/uL Final   Immature Granulocytes 06/17/2018 0  % Final   Abs Immature Granulocytes 06/17/2018 0.04  0.00 - 0.07 K/uL Final   Performed at [facility], [place] [address] [place], [place] [address]   Specimen Description 06/16/2018 BLOOD RIGHT ANTECUBITAL   Final   Special Requests 06/16/2018 BOTTLES DRAWN AEROBIC ONLY Blood Culture results may not be optimal due to an inadequate volume of blood received in culture bottles   Final  Culture 06/16/2018    Final                   Value:NO GROWTH < 12 HOURS Performed at [facility] [address] [place], [place] [address]    Report Status 06/16/2018 PENDING   Incomplete   Specimen Description 06/16/2018 BLOOD RIGHT ANTECUBITAL   Final   Special Requests 06/16/2018 BOTTLES DRAWN AEROBIC ONLY Blood Culture results may not be optimal due to an inadequate volume of blood received in culture bottles   Final   Culture 06/16/2018    Final                   Value:NO GROWTH < 12 HOURS Performed at [facility] [address], [place], [place] [address]    Report Status  06/16/2018 PENDING   Incomplete   Strep Pneumo Urinary Antigen 06/16/2018 NEGATIVE  NEGATIVE Final   Performed at [facility], [place] [address] [place], [place] [address]   MRSA by PCR 06/16/2018 NEGATIVE  NEGATIVE Final   Comment:        The GeneXpert MRSA Assay (FDA approved for NASAL specimens only), is one component of a comprehensive MRSA colonization surveillance program. It is not intended to diagnose MRSA infection nor to guide or monitor treatment for MRSA infections. Performed at [facility], [place] [address], [place], [place] [address]    Glucose-Capillary 06/17/2018 120* 70 - 99 mg/dL Final   Glucose-Capillary 06/17/2018 121* 70 - 99 mg/dL Final   Glucose-Capillary 06/17/2018 155* 70 - 99 mg/dL Final   Glucose-Capillary 06/17/2018 158* 70 - 99 mg/dL Final   Glucose-Capillary 06/17/2018 204* 70 - 99 mg/dL Final    (this displays the last labs from the last 3 days)  No results found for: TOTALPROTELP, ALBUMINELP, A1GS, A2GS, BETS, BETA2SER, GAMS, MSPIKE, SPEI (this displays SPEP labs)  No results found for: KPAFRELGTCHN, LAMBDASER, KAPLAMBRATIO (kappa/lambda light chains)  No results found for: HGBA, HGBA2QUANT, HGBFQUANT, HGBSQUAN (Hemoglobinopathy evaluation)   No results found for: LDH  Lab Results  Component Value Date   IRON 46 04/03/2015   TIBC 259 04/03/2015   IRONPCTSAT 18 04/03/2015   (Iron and TIBC)  Lab Results  Component Value Date   FERRITIN 135 04/03/2015    Urinalysis    Component Value Date/Time   COLORURINE YELLOW 06/16/2018 1647   APPEARANCEUR HAZY (A) 06/16/2018 1647   LABSPEC 1.017 06/16/2018 1647   PHURINE 5.0 06/16/2018 1647   GLUCOSEU NEGATIVE 06/16/2018 1647   HGBUR SMALL (A) 06/16/2018 1647   BILIRUBINUR NEGATIVE 06/16/2018 1647   [place] 06/16/2018 1647   PROTEINUR NEGATIVE 06/16/2018 1647   UROBILINOGEN 1.0 09/16/2013 2243   NITRITE NEGATIVE 06/16/2018 1647   LEUKOCYTESUR SMALL (A)  06/16/2018 1647     STUDIES: Ct Head Wo Contrast  Result Date: 06/16/2018 CLINICAL DATA:  63 year old with encephalopathy. Altered mental status. EXAM: CT HEAD WITHOUT CONTRAST TECHNIQUE: Contiguous axial images were obtained from the base of the skull through the vertex without intravenous contrast. COMPARISON:  01/27/2018 FINDINGS: Brain: Extensive vasogenic edema in the left temporal lobe, left occipital and left parietal lobes. There is some sulci effacement in the left cerebrum particularly along the posterior aspect. No evidence for acute hemorrhage or midline shift. Negative for hydronephrosis. Mass lesion is not confidently identified but could be associated with the vasogenic edema. Mild effacement of the left frontal horn. Vascular: No hyperdense vessel or unexpected calcification. Skull: Normal. Negative  for fracture or focal lesion. Sinuses/Orbits: No acute finding. Other: None. IMPRESSION: 1. Vasogenic edema involving the left temporal lobe, left occipital lobe and left parietal lobe. Findings raise concern for an underlying lesion such as infection or neoplasm. Recommend further characterization with an MRI, with and without contrast. 2. Mild sulci effacement in the left cerebral hemisphere without significant mass effect. 3. Negative for acute hemorrhage.  No significant midline shift. These results were called by telephone at the time of interpretation on 06/16/2018 at 4:07 pm to [facility] , who verbally acknowledged these results. Electronically Signed   By: Markus Daft M.D.   On: 06/16/2018 16:09   Ct Chest W Contrast  Result Date: 06/16/2018 CLINICAL DATA:  Neoplasm suspected EXAM: CT CHEST, ABDOMEN, AND PELVIS WITH CONTRAST TECHNIQUE: Multidetector CT imaging of the chest, abdomen and pelvis was performed following the standard protocol during bolus administration of intravenous contrast. CONTRAST:  184m OMNIPAQUE IOHEXOL 300 MG/ML  SOLN COMPARISON:  CT abdomen and pelvis  09/25/2012 FINDINGS: CT CHEST FINDINGS Cardiovascular: Heart is normal size. Moderate scattered coronary artery calcifications and aortic calcifications. No evidence of aortic aneurysm. Mediastinum/Nodes: Mildly prominent subcarinal lymph node with a short axis diameter of 11 mm. No hilar or axillary adenopathy. Lungs/Pleura: Rounded masslike airspace opacity noted in the left lower lobe measuring up to 6.8 cm concerning for malignancy. Small left pleural effusion. Right lung clear. Scarring in the lingula. Scattered air-filled cystic areas in the lungs bilaterally. Musculoskeletal: Chest wall soft tissues are unremarkable. No acute bony abnormality. CT ABDOMEN PELVIS FINDINGS Hepatobiliary: No focal hepatic abnormality. Gallbladder unremarkable. Pancreas: No focal abnormality or ductal dilatation. Spleen: No focal abnormality.  Normal size. Adrenals/Urinary Tract: Bilateral adrenal nodules, 2.4 cm on the left and 11 mm on the right. No renal mass or hydronephrosis. Urinary bladder unremarkable. Stomach/Bowel: Moderate stool burden within the colon. Normal appendix. Stomach, large and small bowel grossly unremarkable. Vascular/Lymphatic: Aortic atherosclerosis. Patient is status post aortobifemoral bypass. The right iliofemoral limb is occluded. No adenopathy. Reproductive: No visible focal abnormality. Other: No free fluid or free air. Musculoskeletal: No acute bony abnormality. IMPRESSION: 6.8 cm mass in the medial left lower lobe concerning for primary lung cancer. This could be further evaluated with PET CT anti she would diagnosis. Small left effusion. Coronary artery disease, aortic atherosclerosis. Small bilateral adrenal nodules, nonspecific. Cannot exclude metastases. Prior aortobifemoral bypass. The right iliofemoral limb is occluded. Electronically Signed   By: KRolm BaptiseM.D.   On: 06/16/2018 23:02   Mr BJeri CosAnd Wo Contrast  Result Date: 06/16/2018 CLINICAL DATA:  Encephalopathy with altered  mental status. EXAM: MRI HEAD WITHOUT AND WITH CONTRAST TECHNIQUE: Multiplanar, multiecho pulse sequences of the brain and surrounding structures were obtained without and with intravenous contrast. CONTRAST:  Gadavist 7 mL. COMPARISON:  CT head earlier today. FINDINGS: Brain: There is a large intra-axial posterior LEFT temporal mass, 23 x 27 x 19 mm. Extensive vasogenic edema. No uncal herniation. No midline shift. The lesion is T2 hypointense, T1 hyperintense, marked susceptibility on gradient sequence, and avid postcontrast enhancement. Some areas of central heterogeneity could represent necrosis. A second lesion, 5 mm, is identified, LEFT inferior frontal lobe near the gyrus rectus. Lesion is too small to accurately characterize except as that of a probable metastasis. Generalized atrophy.  Minor white matter disease. Vascular: Normal flow voids. Skull and upper cervical spine: Normal marrow signal. Sinuses/Orbits: Negative. Other: None. IMPRESSION: Two separate enhancing lesions in the brain, with the dominant lesion in  the LEFT posterior temporal lobe, and a subcentimeter lesion in the LEFT frontal lobe, are strongly suggestive of intracranial metastatic disease, unknown primary. Moderate vasogenic edema, but no uncal herniation or midline shift. Imaging characteristics of the larger lesion, with T1 shortening, T2 shortening, and susceptibility on gradient sequence suggest either a metastatic hemorrhagic neoplasm or metastatic melanoma. Electronically Signed   By: Staci Righter M.D.   On: 06/16/2018 18:47   Ct Abdomen Pelvis W Contrast  Result Date: 06/16/2018 CLINICAL DATA:  Neoplasm suspected EXAM: CT CHEST, ABDOMEN, AND PELVIS WITH CONTRAST TECHNIQUE: Multidetector CT imaging of the chest, abdomen and pelvis was performed following the standard protocol during bolus administration of intravenous contrast. CONTRAST:  134m OMNIPAQUE IOHEXOL 300 MG/ML  SOLN COMPARISON:  CT abdomen and pelvis 09/25/2012  FINDINGS: CT CHEST FINDINGS Cardiovascular: Heart is normal size. Moderate scattered coronary artery calcifications and aortic calcifications. No evidence of aortic aneurysm. Mediastinum/Nodes: Mildly prominent subcarinal lymph node with a short axis diameter of 11 mm. No hilar or axillary adenopathy. Lungs/Pleura: Rounded masslike airspace opacity noted in the left lower lobe measuring up to 6.8 cm concerning for malignancy. Small left pleural effusion. Right lung clear. Scarring in the lingula. Scattered air-filled cystic areas in the lungs bilaterally. Musculoskeletal: Chest wall soft tissues are unremarkable. No acute bony abnormality. CT ABDOMEN PELVIS FINDINGS Hepatobiliary: No focal hepatic abnormality. Gallbladder unremarkable. Pancreas: No focal abnormality or ductal dilatation. Spleen: No focal abnormality.  Normal size. Adrenals/Urinary Tract: Bilateral adrenal nodules, 2.4 cm on the left and 11 mm on the right. No renal mass or hydronephrosis. Urinary bladder unremarkable. Stomach/Bowel: Moderate stool burden within the colon. Normal appendix. Stomach, large and small bowel grossly unremarkable. Vascular/Lymphatic: Aortic atherosclerosis. Patient is status post aortobifemoral bypass. The right iliofemoral limb is occluded. No adenopathy. Reproductive: No visible focal abnormality. Other: No free fluid or free air. Musculoskeletal: No acute bony abnormality. IMPRESSION: 6.8 cm mass in the medial left lower lobe concerning for primary lung cancer. This could be further evaluated with PET CT anti she would diagnosis. Small left effusion. Coronary artery disease, aortic atherosclerosis. Small bilateral adrenal nodules, nonspecific. Cannot exclude metastases. Prior aortobifemoral bypass. The right iliofemoral limb is occluded. Electronically Signed   By: KRolm BaptiseM.D.   On: 06/16/2018 23:02   Dg Chest Portable 1 View  Result Date: 06/16/2018 CLINICAL DATA:  Altered mental status. EXAM: PORTABLE CHEST  1 VIEW COMPARISON:  August 09, 2017 FINDINGS: There is consolidation in a portion of the medial left base. There is a minimal left pleural effusion. Lungs elsewhere are clear. Heart size and pulmonary vascularity are normal. No adenopathy. There is aortic atherosclerosis. No bone lesions. IMPRESSION: Medial left base consolidation, likely pneumonia. Small left pleural effusion. Lungs elsewhere clear. Stable cardiac silhouette. Aortic Atherosclerosis (ICD10-I70.0). Followup PA and lateral chest radiographs recommended in 3-4 weeks following trial of antibiotic therapy to ensure resolution and exclude underlying malignancy. Electronically Signed   By: WLowella GripIII M.D.   On: 06/16/2018 13:25    ELIGIBLE FOR AVAILABLE RESEARCH PROTOCOL: no  ASSESSMENT: 63y.o. 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 I explained to make sure this is right and to be able to treat him  we will need to obtain a biopsy from the lung mass--place a needle into the mass and remove a small piece to send to the laboratory.  Mr. Goya said he was not going to have any of that done, and said "just let me die." I explained that if he was not treated the cancer would probably take his life and he would be a Hospice candidate. He seemed agreeable to that, though I don't know how much Mr Crevier actually understands or to what extent he is competent to make these decisions.  On the working assumption that we are dealing with metastatic lung cancer, this is an incurable but treatable problem. Depending on the genomics of the tumor, treatment can be a targeted agent (a pill) taken daily, with generally good tolerance, and in those cases survival can extend to  years. In other cases only chemotherapy is an option with poorer QOL and shorter survival.  If Mr Tuminello agrees to treatment, he would need a needle biopsy of the lung lesion. There does not seem to be enough fluid in the L lung for thoracentesis.  If the patient opts against evaluation and treatment, his prognosis will be less than 6 months, which qualifies him for Hospice. However at this point I cannot say survival would be in the 2-3 weeks range, which would qualify him for [facility].  Accordingly I suggest: (1) psychiatric evaluation to establish competence (2) if patient is not competent a guardian will have to be established (3) if patient is competent, and refuses treatment--or if his guardian agrees with a comfort care approach--he will need a Hospice referral (4) suggest a Palliative Care consult to assist with these questions and a Chaplain consult to clarify advanced directives (5) he will need placement  I will follow peripherally. Appreciate consulting with you on this case.  Chauncey Cruel, MD   06/17/2018 4:06 PM Medical Oncology and Hematology [facility] [address] [place], [place] [address] Tel. [phone]    Fax. [phone]

## 2018-06-17 NOTE — Progress Notes (Signed)
**Note De-Identified vi Obfusction** PROGRESS NOTE    Craig Bentley  DGL:[id number] DOB: Mrch 31, 1957 DOA: 06/16/2018 PCP: Megn Mns, NP   Brief Nrrtive:  Craig Bentley is  63 y.o. mle with history of schizophreni, dementi, COPD, dibetes mellitus, peripherl vsculr disese, hypertension ws brought to the ER from group home fter ptient ws found to be wek nd not himself for lst 24 hours.  Per the report obtined by the ER physicin ptient hd  fll lso.  No complints of ny chest pin shortness of breth nuse vomiting or dirrhe.  ED Course: In the ER ptient ppered nonfocl.  Mildly confused.  At times hllucinting.  MRI of the brin shows 2 lesions concerning for metsttic lesions in the brin.  On-cll neurosurgeon ws consulted nd dmitted for further mngement of further metsttic work-up nd wekness.  Lb works show cretinine of 0.6 glucose 151 AST 18 ALT 14 hemoglobin is 9.4 pltelets 409.  Chest x-ry ws showing infiltrtes concerning for pneumoni for which ptient ws strted on empiric ntibiotics nd COVID-19 ws ordered which ws negtive.  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 Apprecite oncology consult, see 4/25 note.  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 2. Complicted by ptient's schizophreni nd dementi, he's currently stting tht he does not wnt ny procedures nd to "just let [him] die".  At the sme time he ppers to hve delusions regrding being clered of  murder chrge nd sys he's been sent wy from his group home. 3. Continue decdron 4. Neurosurgery recommended metsttic workup nd dignosis vi bx of potentil primry - recommended med/rd onc c/s 5. Will consult  psychitry to ssist with cpcity.   2. ?Cpcity: Previous notes describe tht his sister is his legl gurdin nd the ptient mentioned this s well.  Sister noted tht she ws his legl gurdin, but on further discussion, it does not sound like there is ny pperwork or tht there ws ever ny court proceeding to estblish this.  On my discussion, he hd delusions tht he hd been sent wy from his group home fter being clered of murder chrge in court.  He kept telling me tht he just wnted to be sent to "tht other plce".  Will consult psychitry to ssist Kore in estblishing his cpcity to refuse tretment in the setting of his schizophreni/dementi.  1. Of note, his sister's numbers re [phone] nd [phone].  3. Type 2 Dibetes mellitus:  SSI.  Note tht ptient is on Decdron.  4. 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 5. Anxiety on Klonopin. 6. Dementi on Aricept. 7. Hypertension on lisinopril nd metoprolol. 8. Anemi ppers to be chronic follow CBC. 9. Possible pneumoni for which ptient is on empiric ntibiotics.  COVID-19 ws negtive.  CT chest without pneumoni.  Will discontinue ntibiotics. 10. COPD -not ctively wheezing. 11. Hyperlipidemi on sttins. 12. History of peripherl vsculr disese.  DVT prophylxis: SCD Code Sttus: full  Fmily Communiction: sister Disposition Pln: pending  Consultnts:   psychitry  Procedures:   none   Antimicrobils: Anti-infectives (From dmission, onwrd)   Strt     Dose/Rte Route Frequency Ordered Stop   06/16/18 2145  cefTRIAXone (ROCEPHIN) 1 g in sodium chloride 0.9 % 100 **Note De-Identified vi Obfusction** mL IVPB  Sttus:  Discontinued     1 g 200 mL/hr over 30 Minutes Intrvenous Every 24 hours 06/16/18 2136 06/17/18 1747   06/16/18 2145  zithromycin (ZITHROMAX) 500 mg in sodium chloride 0.9 % 250 mL IVPB  Sttus:  Discontinued     500 mg 250 mL/hr over 60 Minutes Intrvenous Every 24 hours 06/16/18 2136  06/17/18 1747   06/16/18 1500  doxycycline (VIBRA-TABS) tblet 100 mg     100 mg Orl  Once 06/16/18 1451 06/16/18 1459     Subjective: Sys he cme here fter being told he couldn't return to plce he ws previously living.  Sys someone ws killed there.  He ws  suspect.  He's been clered of ll chrges but told he couldn't return. Now trying to go to nother plce.  Discussed with group home, sid tht he sys things like this from time to time.  Objective: Vitls:   06/17/18 0827 06/17/18 1242 06/17/18 1300 06/17/18 1547  BP: 126/73 (!) 148/65  (!) 149/71  Pulse: 93 93  84  Resp: (!) 27   (!) 24  Temp: 97.9 F (36.6 C) (!) 97.2 F (36.2 C) 99.3 F (37.4 C) 99.2 F (37.3 C)  TempSrc: [place]ill[place] Orl Orl Orl  SpO2: 95%   91%  Weight:      Height:        Intke/Output Summry (Lst 24 hours) t 06/17/2018 1736 Lst dt filed t 06/17/2018 1700 Gross per 24 hour  Intke 1319.22 ml  Output 1450 ml  Net -130.78 ml   Filed Weights   06/16/18 1237 06/16/18 2300 06/17/18 0500  Weight: 72.6 kg 77.1 kg 77.1 kg    Exmintion:  Generl exm: Appers clm nd comfortble  Respirtory system: Cler to usculttion. Respirtory effort norml. Crdiovsculr system: S1 & S2 herd, RRR. Gstrointestinl system: Abdomen is nondistended, soft nd nontender.  Centrl nervous system: Alert.  Confused. No focl neurologicl deficits. Extremities: moving ll extremities. Skin: No rshes, lesions or ulcers Psychitry: delusions    Dt Reviewed: I hve personlly reviewed following lbs nd imging studies  CBC: Recent Lbs  Lb 06/16/18 1324 06/17/18 0440  WBC 10.2 10.2  NEUTROABS 7.4 7.3  HGB 9.4* 9.1*  HCT 32.4* 31.2*  MCV 81.8 80.2  PLT 409* 381   Bsic Metbolic Pnel: Recent Lbs  Lb 06/16/18 1324 06/17/18 0440  NA 140 141  K 3.9 3.6  CL 99 102  CO2 26 29  GLUCOSE 151* 116*  BUN 10 12  CREATININE 0.61 0.72  CALCIUM 9.4 9.3   GFR: Estimted  Cretinine Clernce: 91.4 mL/min (by [employer] formul bsed on SCr of 0.72 mg/dL). Liver Function Tests: Recent Lbs  Lb 06/16/18 1324 06/17/18 0440  AST 18 13*  ALT 14 12  ALKPHOS 59 58  BILITOT 0.4 0.5  PROT 6.9 6.4*  ALBUMIN 3.0* 2.7*   No results for input(s): LIPASE, AMYLASE in the lst 168 hours. No results for input(s): AMMONIA in the lst 168 hours. Cogultion Profile: No results for input(s): INR, PROTIME in the lst 168 hours. Crdic Enzymes: No results for input(s): CKTOTAL, CKMB, CKMBINDEX, TROPONINI in the lst 168 hours. BNP (lst 3 results) No results for input(s): PROBNP in the lst 8760 hours. HbA1C: No results for input(s): HGBA1C in the lst 72 hours. CBG: Recent Lbs  Lb 06/17/18 0514 06/17/18 0817 06/17/18 1240 06/17/18 1549 06/17/18 1704  GLUCAP 121* 155* 158* 204* 236*   Lipid Profile: No results for input(s): CHOL, HDL, LDLCALC,  TRIG, CHOLHDL, LDLDIRECT in the last 72 hours. Thyroid Function Tests: No results for input(s): TSH, T4TOTL, FREET4, T3FREE, THYROIDB in the last 72 hours. nemia Panel: No results for input(s): VITMINB12, FOLTE, FERRITIN, TIBC, IRON, RETICCTPCT in the last 72 hours. Sepsis Labs: No results for input(s): PROCLCITON, LTICCIDVEN in the last 168 hours.  Recent Results (from the past 240 hour(s))  Urine culture     Status: bnormal (Preliminary result)   Collection Time: 06/16/18  5:11 PM  Result Value Ref Range Status   Specimen Description URINE, RNDOM  Final   Special Requests   Final    NONE Performed at [facility], [address] [place], [place] [address]    Culture >=100,000 COLONIES/mL GRM NEGTIVE RODS ()  Final   Report Status PENDING  Incomplete  SRS Coronavirus 2 [facility] order, Performed in [place] hospital lab)     Status: None   Collection Time: 06/16/18  7:57 PM  Result Value Ref Range Status   SRS Coronavirus 2 NEGTIVE NEGTIVE Final    Comment: (NOTE) If result is  NEGTIVE SRS-CoV-2 target nucleic acids are NOT DETECTED. The SRS-CoV-2 RN is generally detectable in upper and lower  respiratory specimens during the acute phase of infection. The lowest  concentration of SRS-CoV-2 viral copies this assay can detect is 250  copies / mL.  negative result does not preclude SRS-CoV-2 infection  and should not be used as the sole basis for treatment or other  patient management decisions.   negative result may occur with  improper specimen collection / handling, submission of specimen other  than nasopharyngeal swab, presence of viral mutation(s) within the  areas targeted by this assay, and inadequate number of viral copies  (<250 copies / mL).  negative result must be combined with clinical  observations, patient history, and epidemiological information. If result is POSITIVE SRS-CoV-2 target nucleic acids are DETECTED. The SRS-CoV-2 RN is generally detectable in upper and lower  respiratory specimens dur ing the acute phase of infection.  Positive  results are indicative of active infection with SRS-CoV-2.  Clinical  correlation with patient history and other diagnostic information is  necessary to determine patient infection status.  Positive results do  not rule out bacterial infection or co-infection with other viruses. If result is PRESUMPTIVE POSTIVE SRS-CoV-2 nucleic acids MY BE PRESENT.    presumptive positive result was obtained on the submitted specimen  and confirmed on repeat testing.  While 2019 novel coronavirus  (SRS-CoV-2) nucleic acids may be present in the submitted sample  additional confirmatory testing may be necessary for epidemiological  and / or clinical management purposes  to differentiate between  SRS-CoV-2 and other Sarbecovirus currently known to infect humans.  If clinically indicated additional testing with an alternate test  methodology [phone]) is advised. The SRS-CoV-2 RN is generally  detectable  in upper and lower respiratory sp ecimens during the acute  phase of infection. The expected result is Negative. Fact Sheet for Patients:  StrictlyIdeas.no Fact Sheet for Healthcare Providers: BankingDealers.co.za This test is not yet approved or cleared by the [place] FD and has been authorized for detection and/or diagnosis of SRS-CoV-2 by FD under an Emergency Use uthorization (EU).  This EU will remain in effect (meaning this test can be used) for the duration of the COVID-19 declaration under Section 564(b)(1) of the ct, 21 U.S.C. section 360bbb-3(b)(1), unless the authorization is terminated or revoked sooner. Performed at [facility], [place] **Note De-Identified vi Obfusction** [address] [place], [place] [address]   Respirtory Pnel by PCR     Sttus: None   Collection Time: 06/16/18  7:57 PM  Result Vlue Ref Rnge Sttus   denovirus NOT DETECTED NOT DETECTED Finl   Coronvirus 229E NOT DETECTED NOT DETECTED Finl    Comment: (NOTE) The Coronvirus on the Respirtory Pnel, DOES NOT test for the novel  Coronvirus (2019 nCoV)    Coronvirus HKU1 NOT DETECTED NOT DETECTED Finl   Coronvirus NL63 NOT DETECTED NOT DETECTED Finl   Coronvirus OC43 NOT DETECTED NOT DETECTED Finl   Metpneumovirus NOT DETECTED NOT DETECTED Finl   Rhinovirus / Enterovirus NOT DETECTED NOT DETECTED Finl   Influenz  NOT DETECTED NOT DETECTED Finl   Influenz B NOT DETECTED NOT DETECTED Finl   Prinfluenz Virus 1 NOT DETECTED NOT DETECTED Finl   Prinfluenz Virus 2 NOT DETECTED NOT DETECTED Finl   Prinfluenz Virus 3 NOT DETECTED NOT DETECTED Finl   Prinfluenz Virus 4 NOT DETECTED NOT DETECTED Finl   Respirtory Syncytil Virus NOT DETECTED NOT DETECTED Finl   Bordetell pertussis NOT DETECTED NOT DETECTED Finl   Chlmydophil pneumonie NOT DETECTED NOT DETECTED Finl   Mycoplsm pneumonie NOT DETECTED NOT DETECTED Finl    Comment:  Performed t [facility], [place], [place], [place] [address]  MRS PCR Screening     Sttus: None   Collection Time: 06/16/18 11:00 PM  Result Vlue Ref Rnge Sttus   MRS by PCR NEGTIVE NEGTIVE Finl    Comment:        The GeneXpert MRS ssy (FD pproved for NSL specimens only), is one component of  comprehensive MRS coloniztion surveillnce progrm. It is not intended to dignose MRS infection nor to guide or monitor tretment for MRS infections. Performed t [facility], [place] [address], [place], [place] [address]   Culture, blood (routine x 2) Cll MD if unble to obtin prior to ntibiotics being given     Sttus: None (Preliminry result)   Collection Time: 06/16/18 11:02 PM  Result Vlue Ref Rnge Sttus   Specimen Description BLOOD RIGHT NTECUBITL  Finl   Specil Requests   Finl    BOTTLES DRWN EROBIC ONLY Blood Culture results my not be optiml due to n indequte volume of blood received in culture bottles   Culture   Finl    NO GROWTH < 12 HOURS Performed t [facility] [address], [place], [place] [address]    Report Sttus PENDING  Incomplete  Culture, blood (routine x 2) Cll MD if unble to obtin prior to ntibiotics being given     Sttus: None (Preliminry result)   Collection Time: 06/16/18 11:03 PM  Result Vlue Ref Rnge Sttus   Specimen Description BLOOD RIGHT NTECUBITL  Finl   Specil Requests   Finl    BOTTLES DRWN EROBIC ONLY Blood Culture results my not be optiml due to n indequte volume of blood received in culture bottles   Culture   Finl    NO GROWTH < 12 HOURS Performed t [facility] [address], [place], [place] [address]    Report Sttus PENDING  Incomplete         Rdiology Studies: Ct Hed Wo Contrst  Result Dte: 06/16/2018 CLINICL DT:  89 yer old with encephlopthy. ltered mentl sttus. EXM: CT HED WITHOUT CONTRST TECHNIQUE: Contiguous xil  imges were obtined from the bse of the skull through the vertex without intrvenous contrst. COMPRISON: **Note De-Identified vi Obfusction** 01/27/2018 FINDINGS: Brin: Extensive vsogenic edem in the left temporl lobe, left occipitl nd left prietl lobes. There is some sulci effcement in the left cerebrum prticulrly long the posterior spect. No evidence for cute hemorrhge or midline shift. Negtive for hydronephrosis. Mss lesion is not confidently identified but could be ssocited with the vsogenic edem. Mild effcement of the left frontl horn. Vsculr: No hyperdense vessel or unexpected clcifiction. Skull: Norml. Negtive for frcture or focl lesion. Sinuses/Orbits: No cute finding. Other: None. IMPRESSION: 1. Vsogenic edem involving the left temporl lobe, left occipitl lobe nd left prietl lobe. Findings rise concern for n underlying lesion such s infection or neoplsm. Recommend further chrcteriztion with n MRI, with nd without contrst. 2. Mild sulci effcement in the left cerebrl hemisphere without significnt mss effect. 3. Negtive for cute hemorrhge.  No significnt midline shift. These results were clled by telephone t the time of interprettion on 06/16/2018 t 4:07 pm to [facility] , who verblly cknowledged these results. Electroniclly Signed   By: Mrkus Dft M.D.   On: 06/16/2018 16:09   Ct Chest W Contrst  Result Dte: 06/16/2018 CLINICAL DATA:  Neoplsm suspected EXAM: CT CHEST, ABDOMEN, AND PELVIS WITH CONTRAST TECHNIQUE: Multidetector CT imging of the chest, bdomen nd pelvis ws performed following the stndrd protocol during bolus dministrtion of intrvenous contrst. CONTRAST:  188mL OMNIPAQUE IOHEXOL 300 MG/ML  SOLN COMPARISON:  CT bdomen nd pelvis 09/25/2012 FINDINGS: CT CHEST FINDINGS Crdiovsculr: Hert is norml size. Moderte scttered coronry rtery clcifictions nd ortic clcifictions. No evidence of ortic neurysm. Medistinum/Nodes: Mildly  prominent subcrinl lymph node with  short xis dimeter of 11 mm. No hilr or xillry denopthy. Lungs/Pleur: Rounded msslike irspce opcity noted in the left lower lobe mesuring up to 6.8 cm concerning for mlignncy. Smll left pleurl effusion. Right lung cler. Scrring in the lingul. Scttered ir-filled cystic res in the lungs bilterlly. Musculoskeletl: Chest wll soft tissues re unremrkble. No cute bony bnormlity. CT ABDOMEN PELVIS FINDINGS Heptobiliry: No focl heptic bnormlity. Gllbldder unremrkble. Pncres: No focl bnormlity or ductl dilttion. Spleen: No focl bnormlity.  Norml size. Adrenls/Urinry Trct: Bilterl drenl nodules, 2.4 cm on the left nd 11 mm on the right. No renl mss or hydronephrosis. Urinry bldder unremrkble. Stomch/Bowel: Moderte stool burden within the colon. Norml ppendix. Stomch, lrge nd smll bowel grossly unremrkble. Vsculr/Lymphtic: Aortic therosclerosis. Ptient is sttus post ortobifemorl bypss. 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 No denopthy. Reproductive: No visible focl bnormlity. Other: No free fluid or free ir. Musculoskeletl: No cute bony bnormlity. IMPRESSION: 6.8 cm mss in the medil left lower lobe concerning for primry lung cncer. This could be further evluted with PET CT nti she would dignosis. Smll left effusion. Coronry rtery disese, ortic therosclerosis. Smll bilterl drenl nodules, nonspecific. Cnnot exclude metstses. Prior ortobifemorl bypss. 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 Electroniclly Signed   By: Rolm Bptise M.D.   On: 06/16/2018 23:02   Mr Jeri Cos And Wo Contrst  Result Dte: 06/16/2018 CLINICAL DATA:  Encephlopthy with ltered mentl sttus. EXAM: MRI HEAD WITHOUT AND WITH CONTRAST TECHNIQUE: Multiplnr, multiecho pulse sequences of the brin nd surrounding structures were obtined without nd with intrvenous contrst. CONTRAST:   Gdvist 7 mL. COMPARISON:  CT hed erlier tody. FINDINGS: Brin: There is  lrge intr-xil posterior LEFT temporl mss, 23 x 27 x 19 mm. Extensive vsogenic edem. No uncl hernition. No midline shift. The lesion is T2 hypointense, T1 hyperintense, mrked susceptibility on grdient sequence, nd vid postcontrst enhncement. **Note De-Identified vi Obfusction** Some res of centrl heterogeneity could represent necrosis.  second lesion, 5 mm, is identified, LEFT inferior frontl lobe ner the gyrus rectus. Lesion is too smll to ccurtely chrcterize except s tht of  probble metstsis. Generlized trophy.  Minor white mtter disese. Vsculr: Norml flow voids. Skull nd upper cervicl spine: Norml mrrow signl. Sinuses/Orbits: Negtive. Other: None. IMPRESSION: Two seprte enhncing lesions in the brin, with the dominnt lesion in the LEFT posterior temporl lobe, nd  subcentimeter lesion in the LEFT frontl lobe, re strongly suggestive of intrcrnil metsttic disese, unknown primry. Moderte vsogenic edem, but no uncl hernition or midline shift. Imging chrcteristics of the lrger lesion, with T1 shortening, T2 shortening, nd susceptibility on grdient sequence suggest either  metsttic hemorrhgic neoplsm or metsttic melnom. Electroniclly Signed   By: Stci Righter M.D.   On: 06/16/2018 18:47   Ct bdomen Pelvis W Contrst  Result Dte: 06/16/2018 CLINICL DT:  Neoplsm suspected EXM: CT CHEST, BDOMEN, ND PELVIS WITH CONTRST TECHNIQUE: Multidetector CT imging of the chest, bdomen nd pelvis ws performed following the stndrd protocol during bolus dministrtion of intrvenous contrst. CONTRST:  197mL OMNIPQUE IOHEXOL 300 MG/ML  SOLN COMPRISON:  CT bdomen nd pelvis 09/25/2012 FINDINGS: CT CHEST FINDINGS Crdiovsculr: Hert is norml size. Moderte scttered coronry rtery clcifictions nd ortic clcifictions. No evidence of ortic neurysm. Medistinum/Nodes: Mildly prominent  subcrinl lymph node with  short xis dimeter of 11 mm. No hilr or xillry denopthy. Lungs/Pleur: Rounded msslike irspce opcity noted in the left lower lobe mesuring up to 6.8 cm concerning for mlignncy. Smll left pleurl effusion. Right lung cler. Scrring in the lingul. Scttered ir-filled cystic res in the lungs bilterlly. Musculoskeletl: Chest wll soft tissues re unremrkble. No cute bony bnormlity. CT BDOMEN PELVIS FINDINGS Heptobiliry: No focl heptic bnormlity. Gllbldder unremrkble. Pncres: No focl bnormlity or ductl dilttion. Spleen: No focl bnormlity.  Norml size. drenls/Urinry Trct: Bilterl drenl nodules, 2.4 cm on the left nd 11 mm on the right. No renl mss or hydronephrosis. Urinry bldder unremrkble. Stomch/Bowel: Moderte stool burden within the colon. Norml ppendix. Stomch, lrge nd smll bowel grossly unremrkble. Vsculr/Lymphtic: ortic therosclerosis. Ptient is sttus post ortobifemorl bypss. 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 No denopthy. Reproductive: No visible focl bnormlity. Other: No free fluid or free ir. Musculoskeletl: No cute bony bnormlity. IMPRESSION: 6.8 cm mss in the medil left lower lobe concerning for primry lung cncer. This could be further evluted with PET CT nti she would dignosis. Smll left effusion. Coronry rtery disese, ortic therosclerosis. Smll bilterl drenl nodules, nonspecific. Cnnot exclude metstses. Prior ortobifemorl bypss. 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 Electroniclly Signed   By: Rolm Bptise M.D.   On: 06/16/2018 23:02   Dg Chest Portble 1 View  Result Dte: 06/16/2018 CLINICL DT:  ltered mentl sttus. EXM: PORTBLE CHEST 1 VIEW COMPRISON:  August 09, 2017 FINDINGS: There is consolidtion in  portion of the medil left bse. There is  miniml left pleurl effusion. Lungs elsewhere re cler. Hert size nd pulmonry vsculrity  re norml. No denopthy. There is ortic therosclerosis. No bone lesions. IMPRESSION: Medil left bse consolidtion, likely pneumoni. Smll left pleurl effusion. Lungs elsewhere cler. Stble crdic silhouette. ortic therosclerosis (ICD10-I70.0). Followup P nd lterl chest rdiogrphs recommended in 3-4 weeks following tril of ntibiotic therpy to ensure resolution nd exclude underlying mlignncy. Electroniclly Signed   By: Lowell Grip III M.D.   On: 06/16/2018 13:25        Scheduled Meds: . clonzePM  1  mg Oral TID  . cloZAPine  200 mg Oral BID  . dexamethasone  4 mg Intravenous Q6H  . donepezil  10 mg Oral QHS  . gabapentin  300 mg Oral TID  . haloperidol  5 mg Oral BID  . insulin aspart  0-9 Units Subcutaneous Q4H  . lactulose  30 g Oral BID  . lisinopril  5 mg Oral Daily  . metoprolol tartrate  25 mg Oral BID  . pantoprazole  40 mg Oral Daily  . senna-docusate  2 tablet Oral QHS  . simvastatin  5 mg Oral QHS  . trihexyphenidyl  5 mg Oral BID   Continuous Infusions: . sodium chloride 75 mL/hr at 06/17/18 0257  . azithromycin    . cefTRIAXone (ROCEPHIN)  IV       LOS: 0 days    Time spent: over 30 min    Fayrene Helper, MD [facility] Pager AMION  If 7PM-7AM, please contact night-coverage [email or web address] Password [facility] 06/17/2018, 5:36 PM

## 2018-06-18 DIAGNOSIS — F200 Paranoid schizophrenia: Secondary | ICD-10-CM

## 2018-06-18 DIAGNOSIS — J449 Chronic obstructive pulmonary disease, unspecified: Secondary | ICD-10-CM

## 2018-06-18 DIAGNOSIS — Z515 Encounter for palliative care: Secondary | ICD-10-CM

## 2018-06-18 LAB — GLUCOSE, CAPILLARY
Glucose-Capillary: 182 mg/dL — ABNORMAL HIGH (ref 70–99)
Glucose-Capillary: 213 mg/dL — ABNORMAL HIGH (ref 70–99)
Glucose-Capillary: 232 mg/dL — ABNORMAL HIGH (ref 70–99)
Glucose-Capillary: 258 mg/dL — ABNORMAL HIGH (ref 70–99)
Glucose-Capillary: 279 mg/dL — ABNORMAL HIGH (ref 70–99)
Glucose-Capillary: 316 mg/dL — ABNORMAL HIGH (ref 70–99)

## 2018-06-18 LAB — URINE CULTURE: Culture: >=100000 — AB

## 2018-06-18 MED ORDER — SODIUM CHLORIDE 0.9 % IV SOLN
1.0000 g | INTRAVENOUS | Status: DC
  Administered 2018-06-18 – 2018-06-19 (×2): 1 g via INTRAVENOUS
  Filled 2018-06-18 (×2): qty 10

## 2018-06-18 NOTE — Consult Note (Signed)
[facility] Consult   Reason for Consult:  ''Hx of Schizophrenia. Capacity to refuse treatment.'' Referring Physician:  Dr. Florene Glen Patient Identification: Craig Bentley MRN:  [id number] Principal Diagnosis: Brain metastasis [facility]) Diagnosis:  Principal Problem:   Brain metastasis ([place]) Active Problems:   Paranoid schizophrenia ([place])   COPD (chronic obstructive pulmonary disease) ([place])   CAP (community acquired pneumonia)   Brain metastases ([place])   Total Time spent with patient: 45 minutes  Subjective:   Craig Bentley is a 63 y.o. male patient admitted due to weakness.  HPI:  Patient is a poor historian but per chart review, he has history of Paranoid schizophrenia, dementia, COPD, diabetes mellitus, peripheral vascular disease and hypertension. He was brought to the hospital from group home due to weakness and not himself for over 24 hours. Patient is alert but extremely confused and only oriented to person but not place or time. Patient states that he refused the treatment offered to him because ''someone told me that a black dude died in a rest home and I don't to die like that.'' He says a lot of people are dying, has no idea why his doctor wants him to get treatment at the hospital instead of sending him home. He was unable to name the last 3 American president starting from the current one. He states that today's date is 05/13/1921 and unable to provide his date of birth. He scored 12/30 on mini-mental status examination. Patient clearly has no capacity to make informed medical decision.  Past Psychiatric History: as above  Risk to Self:   Risk to Others:   Prior Inpatient Therapy:   Prior Outpatient Therapy:    Past Medical History:  Past Medical History:  Diagnosis Date  . Anxiety   . Constipation   . COPD (chronic obstructive pulmonary disease) ([place])   . Diabetes mellitus   . Fecal impaction ([place])   . Hypertension   . Pneumonia   . Schizophrenia,  schizo-affective ([place])     Past Surgical History:  Procedure Laterality Date  . AMPUTATION Right 04/23/2015   Procedure: Right Fifth Toe Amputation;  Surgeon: Conrad Circle [place], MD;  Location: [place];  Service: Vascular;  Laterality: Right;  . AMPUTATION Right 08/27/2015   Procedure: RIGHT BELOW KNEE AMPUTATION ;  Surgeon: Newt Minion, MD;  Location: [place];  Service: Orthopedics;  Laterality: Right;  . AORTA - BILATERAL FEMORAL ARTERY BYPASS GRAFT Bilateral 04/23/2015   Procedure: AORTOBIFEMORAL BYPASS GRAFT;  Surgeon: Conrad Tenkiller, MD;  Location: [place];  Service: Vascular;  Laterality: Bilateral;  . FEMORAL-FEMORAL BYPASS GRAFT Left 04/23/2015   Procedure:  Left Common Femoral Artery to Superficial Femoral Artery Bypass Graft with Vein;  Surgeon: Conrad [place], MD;  Location: [facility];  Service: Vascular;  Laterality: Left;  . FEMORAL-TIBIAL BYPASS GRAFT Right 04/23/2015   Procedure: Right Common Femoral to Posterior Tibial Bypass with Composite Vein Graft. ;  Surgeon: Conrad Hopwood, MD;  Location: [facility];  Service: Vascular;  Laterality: Right;  . PERIPHERAL VASCULAR CATHETERIZATION N/A 04/07/2015   Procedure: Abdominal Aortogram;  Surgeon: Angelia Mould, MD;  Location: [facility];  Service: Cardiovascular;  Laterality: N/A;   Family History:  Family History  Problem Relation Age of Onset  . Mental illness Mother    Family Psychiatric  History:  Social History:  Social History   Substance and Sexual Activity  Alcohol Use No  . Alcohol/week: 0.0 standard drinks     Social  History   Substance and Sexual Activity  Drug Use No    Social History   Socioeconomic History  . Marital status: Single    Spouse name: Not on file  . Number of children: Not on file  . Years of education: Not on file  . Highest education level: Not on file  Occupational History  . Not on file  Social Needs  . Financial resource strain: Not on file  . Food insecurity:    Worry: Not on file    Inability:  Not on file  . Transportation needs:    Medical: Not on file    Non-medical: Not on file  Tobacco Use  . Smoking status: Current Every Day Smoker    Packs/day: 1.50    Years: 50.00    Pack years: 75.00    Types: Cigarettes  . Smokeless tobacco: Never Used  Substance and Sexual Activity  . Alcohol use: No    Alcohol/week: 0.0 standard drinks  . Drug use: No  . Sexual activity: Never  Lifestyle  . Physical activity:    Days per week: Not on file    Minutes per session: Not on file  . Stress: Not on file  Relationships  . Social connections:    Talks on phone: Not on file    Gets together: Not on file    Attends religious service: Not on file    Active member of club or organization: Not on file    Attends meetings of clubs or organizations: Not on file    Relationship status: Not on file  Other Topics Concern  . Not on file  Social History Narrative  . Not on file   Additional Social History:    Allergies:  No Known Allergies  Labs:  Results for orders placed or performed during the hospital encounter of 06/16/18 (from the past 48 hour(s))  CBC with Differential     Status: Abnormal   Collection Time: 06/16/18  1:24 PM  Result Value Ref Range   WBC 10.2 4.0 - 10.5 K/uL   RBC 3.96 (L) 4.22 - 5.81 MIL/uL   Hemoglobin 9.4 (L) 13.0 - 17.0 g/dL   HCT 32.4 (L) 39.0 - 52.0 %   MCV 81.8 80.0 - 100.0 fL   MCH 23.7 (L) 26.0 - 34.0 pg   MCHC 29.0 (L) 30.0 - 36.0 g/dL   RDW 15.9 (H) 11.5 - 15.5 %   Platelets 409 (H) 150 - 400 K/uL   nRBC 0.0 0.0 - 0.2 %   Neutrophils Relative % 73 %   Neutro Abs 7.4 1.7 - 7.7 K/uL   Lymphocytes Relative 22 %   Lymphs Abs 2.3 0.7 - 4.0 K/uL   Monocytes Relative 5 %   Monocytes Absolute 0.6 0.1 - 1.0 K/uL   Eosinophils Relative 0 %   Eosinophils Absolute 0.0 0.0 - 0.5 K/uL   Basophils Relative 0 %   Basophils Absolute 0.0 0.0 - 0.1 K/uL   Immature Granulocytes 0 %   Abs Immature Granulocytes 0.04 0.00 - 0.07 K/uL    Comment:  Performed at [facility], [address] [place], [place] [address]  Comprehensive metabolic panel     Status: Abnormal   Collection Time: 06/16/18  1:24 PM  Result Value Ref Range   Sodium 140 135 - 145 mmol/L   Potassium 3.9 3.5 - 5.1 mmol/L   Chloride 99 98 - 111 mmol/L   CO2 26 22 - 32 mmol/L  Glucose, Bld 151 (H) 70 - 99 mg/dL   BUN 10 8 - 23 mg/dL   Creatinine, Ser 0.61 0.61 - 1.24 mg/dL   Calcium 9.4 8.9 - 10.3 mg/dL   Total Protein 6.9 6.5 - 8.1 g/dL   Albumin 3.0 (L) 3.5 - 5.0 g/dL   AST 18 15 - 41 U/L   ALT 14 0 - 44 U/L   Alkaline Phosphatase 59 38 - 126 U/L   Total Bilirubin 0.4 0.3 - 1.2 mg/dL   GFR calc non Af Amer >60 >60 mL/min   GFR calc Af Amer >60 >60 mL/min   Anion gap 15 5 - 15    Comment: Performed at [facility] [address] [place], [place] [address]  POC CBG, ED     Status: Abnormal   Collection Time: 06/16/18  1:30 PM  Result Value Ref Range   Glucose-Capillary 152 (H) 70 - 99 mg/dL  Urinalysis, Routine w reflex microscopic     Status: Abnormal   Collection Time: 06/16/18  4:47 PM  Result Value Ref Range   Color, Urine YELLOW YELLOW   APPearance HAZY (A) CLEAR   Specific Gravity, Urine 1.017 1.005 - 1.030   pH 5.0 5.0 - 8.0   Glucose, UA NEGATIVE NEGATIVE mg/dL   Hgb urine dipstick SMALL (A) NEGATIVE   Bilirubin Urine NEGATIVE NEGATIVE   Ketones, ur NEGATIVE NEGATIVE mg/dL   Protein, ur NEGATIVE NEGATIVE mg/dL   Nitrite NEGATIVE NEGATIVE   Leukocytes,Ua SMALL (A) NEGATIVE   RBC / HPF 0-5 0 - 5 RBC/hpf   WBC, UA 6-10 0 - 5 WBC/hpf   Bacteria, UA MANY (A) NONE SEEN   Squamous Epithelial / LPF 0-5 0 - 5   Mucus PRESENT     Comment: Performed at [facility], [address] [place], [place] [address]  Urine culture     Status: Abnormal   Collection Time: 06/16/18  5:11 PM  Result Value Ref Range   Specimen Description URINE, RANDOM    Special Requests      NONE Performed at [facility], [place] [address],  [place], [place] [address]    Culture >=100,000 COLONIES/mL KLEBSIELLA PNEUMONIAE (A)    Report Status 06/18/2018 FINAL    Organism ID, Bacteria KLEBSIELLA PNEUMONIAE (A)       Susceptibility   Klebsiella pneumoniae - MIC*    AMPICILLIN RESISTANT Resistant     CEFAZOLIN <=4 SENSITIVE Sensitive     CEFTRIAXONE <=1 SENSITIVE Sensitive     CIPROFLOXACIN <=0.25 SENSITIVE Sensitive     GENTAMICIN <=1 SENSITIVE Sensitive     IMIPENEM <=0.25 SENSITIVE Sensitive     NITROFURANTOIN <=16 SENSITIVE Sensitive     TRIMETH/SULFA <=20 SENSITIVE Sensitive     AMPICILLIN/SULBACTAM <=2 SENSITIVE Sensitive     PIP/TAZO <=4 SENSITIVE Sensitive     Extended ESBL NEGATIVE Sensitive     * >=100,000 COLONIES/mL KLEBSIELLA PNEUMONIAE  POC occult blood, ED     Status: None   Collection Time: 06/16/18  6:53 PM  Result Value Ref Range   Fecal Occult Bld NEGATIVE NEGATIVE  SARS Coronavirus 2 [facility] order, Performed in [place] hospital lab)     Status: None   Collection Time: 06/16/18  7:57 PM  Result Value Ref Range   SARS Coronavirus 2 NEGATIVE NEGATIVE    Comment: (NOTE) If result is NEGATIVE SARS-CoV-2 target nucleic acids are NOT DETECTED. The SARS-CoV-2 RNA is generally detectable in upper and lower  respiratory specimens during the acute phase of infection. The lowest  concentration of SARS-CoV-2 viral copies this assay can detect is 250  copies / mL. A negative result does not preclude SARS-CoV-2 infection  and should not be used as the sole basis for treatment or other  patient management decisions.  A negative result may occur with  improper specimen collection / handling, submission of specimen other  than nasopharyngeal swab, presence of viral mutation(s) within the  areas targeted by this assay, and inadequate number of viral copies  (<250 copies / mL). A negative result must be combined with clinical  observations, patient history, and epidemiological information. If result is  POSITIVE SARS-CoV-2 target nucleic acids are DETECTED. The SARS-CoV-2 RNA is generally detectable in upper and lower  respiratory specimens dur ing the acute phase of infection.  Positive  results are indicative of active infection with SARS-CoV-2.  Clinical  correlation with patient history and other diagnostic information is  necessary to determine patient infection status.  Positive results do  not rule out bacterial infection or co-infection with other viruses. If result is PRESUMPTIVE POSTIVE SARS-CoV-2 nucleic acids MAY BE PRESENT.   A presumptive positive result was obtained on the submitted specimen  and confirmed on repeat testing.  While 2019 novel coronavirus  (SARS-CoV-2) nucleic acids may be present in the submitted sample  additional confirmatory testing may be necessary for epidemiological  and / or clinical management purposes  to differentiate between  SARS-CoV-2 and other Sarbecovirus currently known to infect humans.  If clinically indicated additional testing with an alternate test  methodology [phone]) is advised. The SARS-CoV-2 RNA is generally  detectable in upper and lower respiratory sp ecimens during the acute  phase of infection. The expected result is Negative. Fact Sheet for Patients:  StrictlyIdeas.no Fact Sheet for Healthcare Providers: BankingDealers.co.za This test is not yet approved or cleared by the [place] FDA and has been authorized for detection and/or diagnosis of SARS-CoV-2 by FDA under an Emergency Use Authorization (EUA).  This EUA will remain in effect (meaning this test can be used) for the duration of the COVID-19 declaration under Section 564(b)(1) of the Act, 21 U.S.C. section 360bbb-3(b)(1), unless the authorization is terminated or revoked sooner. Performed at [facility], [place] [address] [place], [place] [address]   Respiratory Panel by PCR     Status: None   Collection  Time: 06/16/18  7:57 PM  Result Value Ref Range   Adenovirus NOT DETECTED NOT DETECTED   Coronavirus 229E NOT DETECTED NOT DETECTED    Comment: (NOTE) The Coronavirus on the Respiratory Panel, DOES NOT test for the novel  Coronavirus (2019 nCoV)    Coronavirus HKU1 NOT DETECTED NOT DETECTED   Coronavirus NL63 NOT DETECTED NOT DETECTED   Coronavirus OC43 NOT DETECTED NOT DETECTED   Metapneumovirus NOT DETECTED NOT DETECTED   Rhinovirus / Enterovirus NOT DETECTED NOT DETECTED   Influenza A NOT DETECTED NOT DETECTED   Influenza B NOT DETECTED NOT DETECTED   Parainfluenza Virus 1 NOT DETECTED NOT DETECTED   Parainfluenza Virus 2 NOT DETECTED NOT DETECTED   Parainfluenza Virus 3 NOT DETECTED NOT DETECTED   Parainfluenza Virus 4 NOT DETECTED NOT DETECTED   Respiratory Syncytial Virus NOT DETECTED NOT DETECTED   Bordetella pertussis NOT DETECTED NOT DETECTED   Chlamydophila pneumoniae NOT DETECTED NOT DETECTED   Mycoplasma pneumoniae NOT DETECTED NOT DETECTED    Comment: Performed at [facility], [place],  [place] [address]  Strep pneumoniae urinary antigen     Status: None   Collection Time: 06/16/18  9:39 PM  Result Value Ref Range   Strep Pneumo Urinary Antigen NEGATIVE NEGATIVE    Comment: Performed at [facility] [address] [place], [place] [address]  MRSA PCR Screening     Status: None   Collection Time: 06/16/18 11:00 PM  Result Value Ref Range   MRSA by PCR NEGATIVE NEGATIVE    Comment:        The GeneXpert MRSA Assay (FDA approved for NASAL specimens only), is one component of a comprehensive MRSA colonization surveillance program. It is not intended to diagnose MRSA infection nor to guide or monitor treatment for MRSA infections. Performed at [facility], [place] [address], [place], [place] [address]   Culture, blood (routine x 2) Call MD if unable to obtain prior to antibiotics being given     Status: None (Preliminary result)    Collection Time: 06/16/18 11:02 PM  Result Value Ref Range   Specimen Description BLOOD RIGHT ANTECUBITAL    Special Requests      BOTTLES DRAWN AEROBIC ONLY Blood Culture results may not be optimal due to an inadequate volume of blood received in culture bottles   Culture      NO GROWTH 2 DAYS Performed at [facility] [address], [place], [place] [address]    Report Status PENDING   Culture, blood (routine x 2) Call MD if unable to obtain prior to antibiotics being given     Status: None (Preliminary result)   Collection Time: 06/16/18 11:03 PM  Result Value Ref Range   Specimen Description BLOOD RIGHT ANTECUBITAL    Special Requests      BOTTLES DRAWN AEROBIC ONLY Blood Culture results may not be optimal due to an inadequate volume of blood received in culture bottles   Culture      NO GROWTH 2 DAYS Performed at [facility] [address] [place], [place] [address]    Report Status PENDING   Glucose, capillary     Status: Abnormal   Collection Time: 06/17/18  3:11 AM  Result Value Ref Range   Glucose-Capillary 120 (H) 70 - 99 mg/dL  HIV antibody (Routine Testing)     Status: None   Collection Time: 06/17/18  4:40 AM  Result Value Ref Range   HIV Screen 4th Generation wRfx Non Reactive Non Reactive    Comment: (NOTE) Performed At: [facility] [place], [place] [id number] Rush Farmer MD TD:[id number]   Basic metabolic panel     Status: Abnormal   Collection Time: 06/17/18  4:40 AM  Result Value Ref Range   Sodium 141 135 - 145 mmol/L   Potassium 3.6 3.5 - 5.1 mmol/L   Chloride 102 98 - 111 mmol/L   CO2 29 22 - 32 mmol/L   Glucose, Bld 116 (H) 70 - 99 mg/dL   BUN 12 8 - 23 mg/dL   Creatinine, Ser 0.72 0.61 - 1.24 mg/dL   Calcium 9.3 8.9 - 10.3 mg/dL   GFR calc non Af Amer >60 >60 mL/min   GFR calc Af Amer >60 >60 mL/min   Anion gap 10 5 - 15    Comment: Performed at [facility], [place] [address], [place], [place]  [address]  Hepatic function panel     Status: Abnormal   Collection Time: 06/17/18  4:40 AM  Result Value Ref Range   Total Protein 6.4 (L) 6.5 - 8.1 g/dL   Albumin 2.7 (L) 3.5 - 5.0 g/dL   AST 13 (L) 15 - 41 U/L   ALT 12 0 - 44 U/L   Alkaline Phosphatase 58 38 - 126 U/L   Total Bilirubin 0.5 0.3 - 1.2 mg/dL   Bilirubin, Direct <0.1 0.0 - 0.2 mg/dL   Indirect Bilirubin NOT CALCULATED 0.3 - 0.9 mg/dL    Comment: Performed at [facility] [address], [place], [place] [address]  CBC WITH DIFFERENTIAL     Status: Abnormal   Collection Time: 06/17/18  4:40 AM  Result Value Ref Range   WBC 10.2 4.0 - 10.5 K/uL   RBC 3.89 (L) 4.22 - 5.81 MIL/uL   Hemoglobin 9.1 (L) 13.0 - 17.0 g/dL   HCT 31.2 (L) 39.0 - 52.0 %   MCV 80.2 80.0 - 100.0 fL   MCH 23.4 (L) 26.0 - 34.0 pg   MCHC 29.2 (L) 30.0 - 36.0 g/dL   RDW 15.9 (H) 11.5 - 15.5 %   Platelets 381 150 - 400 K/uL   nRBC 0.0 0.0 - 0.2 %   Neutrophils Relative % 71 %   Neutro Abs 7.3 1.7 - 7.7 K/uL   Lymphocytes Relative 21 %   Lymphs Abs 2.1 0.7 - 4.0 K/uL   Monocytes Relative 8 %   Monocytes Absolute 0.8 0.1 - 1.0 K/uL   Eosinophils Relative 0 %   Eosinophils Absolute 0.0 0.0 - 0.5 K/uL   Basophils Relative 0 %   Basophils Absolute 0.0 0.0 - 0.1 K/uL   Immature Granulocytes 0 %   Abs Immature Granulocytes 0.04 0.00 - 0.07 K/uL    Comment: Performed at [facility], [address] [place], [place] [address]  Glucose, capillary     Status: Abnormal   Collection Time: 06/17/18  5:14 AM  Result Value Ref Range   Glucose-Capillary 121 (H) 70 - 99 mg/dL  Glucose, capillary     Status: Abnormal   Collection Time: 06/17/18  8:17 AM  Result Value Ref Range   Glucose-Capillary 155 (H) 70 - 99 mg/dL  Glucose, capillary     Status: Abnormal   Collection Time: 06/17/18 12:40 PM  Result Value Ref Range   Glucose-Capillary 158 (H) 70 - 99 mg/dL  Glucose, capillary     Status: Abnormal   Collection Time: 06/17/18  3:49 PM   Result Value Ref Range   Glucose-Capillary 204 (H) 70 - 99 mg/dL  Glucose, capillary     Status: Abnormal   Collection Time: 06/17/18  5:04 PM  Result Value Ref Range   Glucose-Capillary 236 (H) 70 - 99 mg/dL  Glucose, capillary     Status: Abnormal   Collection Time: 06/17/18  7:51 PM  Result Value Ref Range   Glucose-Capillary 230 (H) 70 - 99 mg/dL   Comment 1 Notify RN    Comment 2 Document in Chart   Glucose, capillary     Status: Abnormal   Collection Time: 06/18/18 12:50 AM  Result Value Ref Range   Glucose-Capillary 182 (H) 70 - 99 mg/dL  Glucose, capillary     Status: Abnormal   Collection Time: 06/18/18  4:31 AM  Result Value Ref Range   Glucose-Capillary 232 (H) 70 - 99 mg/dL  Glucose, capillary     Status: Abnormal   Collection Time: 06/18/18  8:01 AM  Result Value Ref Range   Glucose-Capillary 213 (  H) 70 - 99 mg/dL   Comment 1 Notify RN    Comment 2 Document in Chart   Glucose, capillary     Status: Abnormal   Collection Time: 06/18/18 12:17 PM  Result Value Ref Range   Glucose-Capillary 258 (H) 70 - 99 mg/dL    Current Facility-Administered Medications  Medication Dose Route Frequency Provider Last Rate Last Dose  . acetaminophen (TYLENOL) tablet 650 mg  650 mg Oral Q6H PRN Rise Patience, MD       Or  . acetaminophen (TYLENOL) suppository 650 mg  650 mg Rectal Q6H PRN Rise Patience, MD      . clonazePAM Bobbye [place]) tablet 1 mg  1 mg Oral TID Rise Patience, MD   1 mg at 06/18/18 0925  . cloZAPine (CLOZARIL) tablet 200 mg  200 mg Oral BID Rise Patience, MD   200 mg at 06/18/18 0932  . dexamethasone (DECADRON) injection 4 mg  4 mg Intravenous Q6H Rise Patience, MD   4 mg at 06/18/18 1240  . donepezil (ARICEPT) tablet 10 mg  10 mg Oral QHS Rise Patience, MD   10 mg at 06/17/18 2121  . gabapentin (NEURONTIN) capsule 300 mg  300 mg Oral TID Rise Patience, MD   300 mg at 06/18/18 3244  . haloperidol (HALDOL) tablet 5 mg   5 mg Oral BID Rise Patience, MD   5 mg at 06/18/18 0932  . insulin aspart (novoLOG) injection 0-9 Units  0-9 Units Subcutaneous Q4H Rise Patience, MD   3 Units at 06/18/18 [phone]  . lactulose (CHRONULAC) 10 GM/15ML solution 30 g  30 g Oral BID Rise Patience, MD   30 g at 06/18/18 7253  . lisinopril (ZESTRIL) tablet 5 mg  5 mg Oral Daily Rise Patience, MD   5 mg at 06/18/18 6644  . metoprolol tartrate (LOPRESSOR) tablet 25 mg  25 mg Oral BID Rise Patience, MD   25 mg at 06/18/18 0347  . ondansetron (ZOFRAN) tablet 4 mg  4 mg Oral Q6H PRN Rise Patience, MD       Or  . ondansetron [facility]) injection 4 mg  4 mg Intravenous Q6H PRN Rise Patience, MD      . pantoprazole (PROTONIX) EC tablet 40 mg  40 mg Oral Daily Rise Patience, MD   40 mg at 06/18/18 4259  . senna-docusate (Senokot-S) tablet 2 tablet  2 tablet Oral QHS Rise Patience, MD   2 tablet at 06/17/18 2121  . simvastatin (ZOCOR) tablet 5 mg  5 mg Oral QHS Rise Patience, MD   5 mg at 06/17/18 2122  . trihexyphenidyl (ARTANE) tablet 5 mg  5 mg Oral BID Rise Patience, MD   5 mg at 06/18/18 0932    Musculoskeletal: Strength & Muscle Tone: not tested Gait & Station: unable to stand Patient leans: N/A  Psychiatric Specialty Exam: Physical Exam  Psychiatric: He has a normal mood and affect. Judgment normal. His speech is tangential and slurred. He is actively hallucinating. Thought content is paranoid. Cognition and memory are impaired.    Review of Systems  Psychiatric/Behavioral: Positive for hallucinations.    Blood pressure (!) 155/71, pulse 80, temperature 98.6 F (37 C), temperature source Oral, resp. rate 17, height 5' 7.99" (1.727 m), weight 77.1 kg, SpO2 96 %.Body mass index is 25.85 kg/m.  General Appearance: Casual  Eye Contact:  Fair  Speech:  Garbled and  Slurred  Volume:  Decreased  Mood:  Dysphoric  Affect:  Constricted  Thought Process:   Disorganized  Orientation:  Other:  only to person  Thought Content:  Illogical, Paranoid Ideation and Tangential  Suicidal Thoughts:  No  Homicidal Thoughts:  No  Memory:  Immediate;   Fair Recent;   Poor Remote;   Poor  Judgement:  Poor  Insight:  Lacking  Psychomotor Activity:  Psychomotor Retardation  Concentration:  Concentration: Fair and Attention Span: Fair  Recall:  Poor  Fund of Knowledge:  Poor  Language:  Fair  Akathisia:  No  Handed:  Right  AIMS (if indicated):     Assets:  Housing  ADL's:  Impaired  Cognition:  Impaired,  Moderate  Sleep:        Treatment Plan Summary: 63 year old malewithhistory of Paranoid schizophrenia, dementia, COPD, diabetes mellitus, peripheral vascular disease, hypertension who was brought to the hospital due to weakness. Psychiatric consult was called to determined capacity to refuse treatment. Based on my evaluation/assessment today ( pt scored 12/30 on mmse) and giving the fact that patient suffers from paranoid schizophrenia/moderate to severe Dementia, he has no Capacity to make an informed decision about his treatment.  Recommendation: Contact family member to determine if patient has medical power of attorney and or Legal guardian  Disposition: Psychiatric service signing off. Re-consult as needed.  Corena Pilgrim, MD 06/18/2018 1:13 PM

## 2018-06-18 NOTE — Progress Notes (Addendum)
PROGRESS NOTE    Craig Bentley  MWU:[id number] DOB: 11/22/1955 DOA: 06/16/2018 PCP: Herma Mering, NP   Brief Narrative:  Craig Bentley is Craig Bentley 63 y.o. male with history of schizophrenia, dementia, COPD, diabetes mellitus, peripheral vascular disease, hypertension was brought to the ER from group home after patient was found to be weak and not himself for last 24 hours.  Per the report obtained by the ER physician patient had Craig Bentley fall also.  No complaints of any chest pain shortness of breath nausea vomiting or diarrhea.  ED Course: In the ER patient appeared nonfocal.  Mildly confused.  At times hallucinating.  MRI of the brain shows 2 lesions concerning for metastatic lesions in the brain.  On-call neurosurgeon was consulted and admitted for further management of further metastatic work-up and weakness.  Lab works show creatinine of 0.6 glucose 151 AST 18 ALT 14 hemoglobin is 9.4 platelets 409.  Chest x-ray was showing infiltrates concerning for pneumonia for which patient was started on empiric antibiotics and COVID-19 was ordered which was negative.  Assessment & Plan:   Principal Problem:   Brain metastasis (HCC) Active Problems:   COPD (chronic obstructive pulmonary disease) (HCC)   Paranoid schizophrenia (HCC)   CAP (community acquired pneumonia)   Brain metastases (HCC)   1. Metastatic brain lesions   6.8 cm Mass in Medial L Lower Lobe concerning for primary lung cancer:   1. Appreciate oncology consult, see 4/25 note.  Likely metastatic lung cancer. 2. Appreciate psych consult, pt lacks capacity to make decisions regarding his likely malignancy.  The patient says that he wants to go home.  He does not want Craig Bentley biopsy or treatment for his cancer.  He says he wants to die at home. 3. Given lack of capacity, will discuss with sister and palliative care to help make plan going forward.   4. Neurosurgery recommended metastatic workup and diagnosis via bx of potential primary - recommended  med/rad onc c/s 5. If plan for more palliative care, will need to discuss plan for decadron going forward. 6. Appreciate Dr. Darrall Dears assistance.  2. ?Capacity:  Previous notes describe that his sister is his legal guardian and the patient mentioned this as well.  Sister noted that she was his legal guardian, but on further discussion, it does not sound like there is any paperwork or that there was ever any court proceeding to establish this.  On my discussion, he had delusions that he had been sent away from his group home after being cleared of murder charge in court.  He kept telling me that he just wanted to be sent to "that other place".   1. Appreciate psychiatry assistance, pt lacks capacity  2. Of note, his sister's numbers are [phone] and [phone] 3. Will work with palliative care and his sister to make Craig Bentley plan going forward.  3. Klebsiella UTI: will treat with ceftriaxone for now  4. Type 2 Diabetes mellitus:  SSI.  Note that patient is on Decadron.  5. Paranoid schizophrenia on Haldol Clozaril and trihexyphenidyl. 6. Anxiety on Klonopin. 7. Dementia on Aricept. 8. Hypertension on lisinopril and metoprolol. 9. Anemia appears to be chronic follow CBC. 10. Possible pneumonia for which patient is on empiric antibiotics.  COVID-19 was negative.  CT chest without pneumonia.  Will discontinue antibiotics. 11. COPD -not actively wheezing. 12. Hyperlipidemia on statins. 13. History of peripheral vascular disease.  DVT prophylaxis: SCD Code Status: full  Family Communication: sister 4/26 Disposition Plan: pending  Consultants:  psychiatry  Procedures:   none   Antimicrobials: Anti-infectives (From admission, onward)   Start     Dose/Rate Route Frequency Ordered Stop   06/16/18 2145  cefTRIAXone (ROCEPHIN) 1 g in sodium chloride 0.9 % 100 mL IVPB  Status:  Discontinued     1 g 200 mL/hr over 30 Minutes Intravenous Every 24 hours 06/16/18 2136 06/17/18 1747    06/16/18 2145  azithromycin (ZITHROMAX) 500 mg in sodium chloride 0.9 % 250 mL IVPB  Status:  Discontinued     500 mg 250 mL/hr over 60 Minutes Intravenous Every 24 hours 06/16/18 2136 06/17/18 1747   06/16/18 1500  doxycycline (VIBRA-TABS) tablet 100 mg     100 mg Oral  Once 06/16/18 1451 06/16/18 1459     Subjective: Craig Bentley&Ox1-2.   States he does not want biopsy. He wants to go home.  Understands he'll die with his cancer.  Objective: Vitals:   06/18/18 0458 06/18/18 0800 06/18/18 0815 06/18/18 0820  BP:      Pulse:  63 80 80  Resp:  (!) 24 12 17   Temp:      TempSrc:      SpO2:  96% 97% 96%  Weight: 77.1 kg     Height:        Intake/Output Summary (Last 24 hours) at 06/18/2018 1316 Last data filed at 06/18/2018 0600 Gross per 24 hour  Intake 240 ml  Output 1150 ml  Net -910 ml   Filed Weights   06/16/18 2300 06/17/18 0500 06/18/18 0458  Weight: 77.1 kg 77.1 kg 77.1 kg    Examination:  General: No acute distress. Cardiovascular: Heart sounds show Craig Bentley regular rate, and rhythm. Lungs: Clear to auscultation bilaterally Abdomen: Soft, nontender, nondistended  Neurological: Alert and oriented 1-2 (knew cone, Craig Bentley.  Thought it was March or May.  Thought president was Craig Bentley). Moves all extremities 4. Cranial nerves II through XII grossly intact. Skin: Warm and dry. No rashes or lesions. Extremities: No clubbing or cyanosis. No edema.      Data Reviewed: I have personally reviewed following labs and imaging studies  CBC: Recent Labs  Lab 06/16/18 1324 06/17/18 0440  WBC 10.2 10.2  NEUTROABS 7.4 7.3  HGB 9.4* 9.1*  HCT 32.4* 31.2*  MCV 81.8 80.2  PLT 409* 381   Basic Metabolic Panel: Recent Labs  Lab 06/16/18 1324 06/17/18 0440  NA 140 141  K 3.9 3.6  CL 99 102  CO2 26 29  GLUCOSE 151* 116*  BUN 10 12  CREATININE 0.61 0.72  CALCIUM 9.4 9.3   GFR: Estimated Creatinine Clearance: 91.4 mL/min (by C-G formula based on SCr of 0.72  mg/dL). Liver Function Tests: Recent Labs  Lab 06/16/18 1324 06/17/18 0440  AST 18 13*  ALT 14 12  ALKPHOS 59 58  BILITOT 0.4 0.5  PROT 6.9 6.4*  ALBUMIN 3.0* 2.7*   No results for input(s): LIPASE, AMYLASE in the last 168 hours. No results for input(s): AMMONIA in the last 168 hours. Coagulation Profile: No results for input(s): INR, PROTIME in the last 168 hours. Cardiac Enzymes: No results for input(s): CKTOTAL, CKMB, CKMBINDEX, TROPONINI in the last 168 hours. BNP (last 3 results) No results for input(s): PROBNP in the last 8760 hours. HbA1C: No results for input(s): HGBA1C in the last 72 hours. CBG: Recent Labs  Lab 06/17/18 1951 06/18/18 0050 06/18/18 0431 06/18/18 0801 06/18/18 1217  GLUCAP 230* 182* 232* 213* 258*   Lipid Profile: No results for input(s):  CHOL, HDL, LDLCALC, TRIG, CHOLHDL, LDLDIRECT in the last 72 hours. Thyroid Function Tests: No results for input(s): TSH, T4TOTAL, FREET4, T3FREE, THYROIDAB in the last 72 hours. Anemia Panel: No results for input(s): VITAMINB12, FOLATE, FERRITIN, TIBC, IRON, RETICCTPCT in the last 72 hours. Sepsis Labs: No results for input(s): PROCALCITON, LATICACIDVEN in the last 168 hours.  Recent Results (from the past 240 hour(s))  Urine culture     Status: Abnormal   Collection Time: 06/16/18  5:11 PM  Result Value Ref Range Status   Specimen Description URINE, RANDOM  Final   Special Requests   Final    NONE Performed at [facility], [address] [place], [place] [address]    Culture >=100,000 COLONIES/mL KLEBSIELLA PNEUMONIAE (Retta Pitcher)  Final   Report Status 06/18/2018 FINAL  Final   Organism ID, Bacteria KLEBSIELLA PNEUMONIAE (Tannya Gonet)  Final      Susceptibility   Klebsiella pneumoniae - MIC*    AMPICILLIN RESISTANT Resistant     CEFAZOLIN <=4 SENSITIVE Sensitive     CEFTRIAXONE <=1 SENSITIVE Sensitive     CIPROFLOXACIN <=0.25 SENSITIVE Sensitive     GENTAMICIN <=1 SENSITIVE Sensitive     IMIPENEM <=0.25  SENSITIVE Sensitive     NITROFURANTOIN <=16 SENSITIVE Sensitive     TRIMETH/SULFA <=20 SENSITIVE Sensitive     AMPICILLIN/SULBACTAM <=2 SENSITIVE Sensitive     PIP/TAZO <=4 SENSITIVE Sensitive     Extended ESBL NEGATIVE Sensitive     * >=100,000 COLONIES/mL KLEBSIELLA PNEUMONIAE  SARS Coronavirus 2 [facility] order, Performed in [facility] hospital lab)     Status: None   Collection Time: 06/16/18  7:57 PM  Result Value Ref Range Status   SARS Coronavirus 2 NEGATIVE NEGATIVE Final    Comment: (NOTE) If result is NEGATIVE SARS-CoV-2 target nucleic acids are NOT DETECTED. The SARS-CoV-2 RNA is generally detectable in upper and lower  respiratory specimens during the acute phase of infection. The lowest  concentration of SARS-CoV-2 viral copies this assay can detect is 250  copies / mL. Kalib Bhagat negative result does not preclude SARS-CoV-2 infection  and should not be used as the sole basis for treatment or other  patient management decisions.  Trayvion Embleton negative result may occur with  improper specimen collection / handling, submission of specimen other  than nasopharyngeal swab, presence of viral mutation(s) within the  areas targeted by this assay, and inadequate number of viral copies  (<250 copies / mL). Morrison Masser negative result must be combined with clinical  observations, patient history, and epidemiological information. If result is POSITIVE SARS-CoV-2 target nucleic acids are DETECTED. The SARS-CoV-2 RNA is generally detectable in upper and lower  respiratory specimens dur ing the acute phase of infection.  Positive  results are indicative of active infection with SARS-CoV-2.  Clinical  correlation with patient history and other diagnostic information is  necessary to determine patient infection status.  Positive results do  not rule out bacterial infection or co-infection with other viruses. If result is PRESUMPTIVE POSTIVE SARS-CoV-2 nucleic acids MAY BE PRESENT.   Alawna Graybeal presumptive positive result  was obtained on the submitted specimen  and confirmed on repeat testing.  While 2019 novel coronavirus  (SARS-CoV-2) nucleic acids may be present in the submitted sample  additional confirmatory testing may be necessary for epidemiological  and / or clinical management purposes  to differentiate between  SARS-CoV-2 and other Sarbecovirus currently known to infect humans.  If clinically indicated additional testing with an alternate test  methodology [phone])  is advised. The SARS-CoV-2 RNA is generally  detectable in upper and lower respiratory sp ecimens during the acute  phase of infection. The expected result is Negative. Fact Sheet for Patients:  BoilerBrush.com.cy Fact Sheet for Healthcare Providers: https://pope.com/ This test is not yet approved or cleared by the [place] FDA and has been authorized for detection and/or diagnosis of SARS-CoV-2 by FDA under an Emergency Use Authorization (EUA).  This EUA will remain in effect (meaning this test can be used) for the duration of the COVID-19 declaration under Section 564(b)(1) of the Act, 21 U.S.C. section 360bbb-3(b)(1), unless the authorization is terminated or revoked sooner. Performed at [facility], [address] [place], [place] [address]   Respiratory Panel by PCR     Status: None   Collection Time: 06/16/18  7:57 PM  Result Value Ref Range Status   Adenovirus NOT DETECTED NOT DETECTED Final   Coronavirus 229E NOT DETECTED NOT DETECTED Final    Comment: (NOTE) The Coronavirus on the Respiratory Panel, DOES NOT test for the novel  Coronavirus (2019 nCoV)    Coronavirus HKU1 NOT DETECTED NOT DETECTED Final   Coronavirus NL63 NOT DETECTED NOT DETECTED Final   Coronavirus OC43 NOT DETECTED NOT DETECTED Final   Metapneumovirus NOT DETECTED NOT DETECTED Final   Rhinovirus / Enterovirus NOT DETECTED NOT DETECTED Final   Influenza Danyiel Crespin NOT DETECTED NOT DETECTED Final    Influenza B NOT DETECTED NOT DETECTED Final   Parainfluenza Virus 1 NOT DETECTED NOT DETECTED Final   Parainfluenza Virus 2 NOT DETECTED NOT DETECTED Final   Parainfluenza Virus 3 NOT DETECTED NOT DETECTED Final   Parainfluenza Virus 4 NOT DETECTED NOT DETECTED Final   Respiratory Syncytial Virus NOT DETECTED NOT DETECTED Final   Bordetella pertussis NOT DETECTED NOT DETECTED Final   Chlamydophila pneumoniae NOT DETECTED NOT DETECTED Final   Mycoplasma pneumoniae NOT DETECTED NOT DETECTED Final    Comment: Performed at [facility], [address], [place], [place] [address]  MRSA PCR Screening     Status: None   Collection Time: 06/16/18 11:00 PM  Result Value Ref Range Status   MRSA by PCR NEGATIVE NEGATIVE Final    Comment:        The GeneXpert MRSA Assay (FDA approved for NASAL specimens only), is one component of [place] comprehensive MRSA colonization surveillance program. It is not intended to diagnose MRSA infection nor to guide or monitor treatment for MRSA infections. Performed at [facility], [address], [place], [place] [address]   Culture, blood (routine x 2) Call MD if unable to obtain prior to antibiotics being given     Status: None (Preliminary result)   Collection Time: 06/16/18 11:02 PM  Result Value Ref Range Status   Specimen Description BLOOD RIGHT ANTECUBITAL  Final   Special Requests   Final    BOTTLES DRAWN AEROBIC ONLY Blood Culture results may not be optimal due to an inadequate volume of blood received in culture bottles   Culture   Final    NO GROWTH 2 DAYS Performed at [facility], [address], [place], [place] [address]    Report Status PENDING  Incomplete  Culture, blood (routine x 2) Call MD if unable to obtain prior to antibiotics being given     Status: None (Preliminary result)   Collection Time: 06/16/18 11:03 PM  Result Value Ref Range Status   Specimen Description BLOOD RIGHT ANTECUBITAL  Final  Special Requests    Final    BOTTLES DRAWN AEROBIC ONLY Blood Culture results may not be optimal due to an inadequate volume of blood received in culture bottles   Culture   Final    NO GROWTH 2 DAYS Performed at [facility], [address] [place], [place] [address]    Report Status PENDING  Incomplete         Radiology Studies: Ct Head Wo Contrast  Result Date: 06/16/2018 CLINICAL DATA:  63 year old with encephalopathy. Altered mental status. EXAM: CT HEAD WITHOUT CONTRAST TECHNIQUE: Contiguous axial images were obtained from the base of the skull through the vertex without intravenous contrast. COMPARISON:  01/27/2018 FINDINGS: Brain: Extensive vasogenic edema in the left temporal lobe, left occipital and left parietal lobes. There is some sulci effacement in the left cerebrum particularly along the posterior aspect. No evidence for acute hemorrhage or midline shift. Negative for hydronephrosis. Mass lesion is not confidently identified but could be associated with the vasogenic edema. Mild effacement of the left frontal horn. Vascular: No hyperdense vessel or unexpected calcification. Skull: Normal. Negative for fracture or focal lesion. Sinuses/Orbits: No acute finding. Other: None. IMPRESSION: 1. Vasogenic edema involving the left temporal lobe, left occipital lobe and left parietal lobe. Findings raise concern for an underlying lesion such as infection or neoplasm. Recommend further characterization with an MRI, with and without contrast. 2. Mild sulci effacement in the left cerebral hemisphere without significant mass effect. 3. Negative for acute hemorrhage.  No significant midline shift. These results were called by telephone at the time of interpretation on 06/16/2018 at 4:07 pm to [facility] , who verbally acknowledged these results. Electronically Signed   By: Richarda Overlie M.D.   On: 06/16/2018 16:09   Ct Chest W Contrast  Result Date: 06/16/2018 CLINICAL DATA:  Neoplasm suspected EXAM:  CT CHEST, ABDOMEN, AND PELVIS WITH CONTRAST TECHNIQUE: Multidetector CT imaging of the chest, abdomen and pelvis was performed following the standard protocol during bolus administration of intravenous contrast. CONTRAST:  OMNIPAQUE IOHEXOL 300 MG/ML  SOLN COMPARISON:  CT abdomen and pelvis 09/25/2012 FINDINGS: CT CHEST FINDINGS Cardiovascular: Heart is normal size. Moderate scattered coronary artery calcifications and aortic calcifications. No evidence of aortic aneurysm. Mediastinum/Nodes: Mildly prominent subcarinal lymph node with Rayola Everhart short axis diameter of 11 mm. No hilar or axillary adenopathy. Lungs/Pleura: Rounded masslike airspace opacity noted in the left lower lobe measuring up to 6.8 cm concerning for malignancy. Small left pleural effusion. Right lung clear. Scarring in the lingula. Scattered air-filled cystic areas in the lungs bilaterally. Musculoskeletal: Chest wall soft tissues are unremarkable. No acute bony abnormality. CT ABDOMEN PELVIS FINDINGS Hepatobiliary: No focal hepatic abnormality. Gallbladder unremarkable. Pancreas: No focal abnormality or ductal dilatation. Spleen: No focal abnormality.  Normal size. Adrenals/Urinary Tract: Bilateral adrenal nodules, 2.4 cm on the left and 11 mm on the right. No renal mass or hydronephrosis. Urinary bladder unremarkable. Stomach/Bowel: Moderate stool burden within the colon. Normal appendix. Stomach, large and small bowel grossly unremarkable. Vascular/Lymphatic: Aortic atherosclerosis. Patient is status post aortobifemoral bypass. The right iliofemoral limb is occluded. No adenopathy. Reproductive: No visible focal abnormality. Other: No free fluid or free air. Musculoskeletal: No acute bony abnormality. IMPRESSION: 6.8 cm mass in the medial left lower lobe concerning for primary lung cancer. This could be further evaluated with PET CT anti she would diagnosis. Small left effusion. Coronary artery disease, aortic atherosclerosis. Small bilateral  adrenal nodules, nonspecific. Cannot exclude metastases. Prior aortobifemoral bypass. The  right iliofemoral limb is occluded. Electronically Signed   By: Charlett Nose M.D.   On: 06/16/2018 23:02   Mr Laqueta Jean And Wo Contrast  Result Date: 06/16/2018 CLINICAL DATA:  Encephalopathy with altered mental status. EXAM: MRI HEAD WITHOUT AND WITH CONTRAST TECHNIQUE: Multiplanar, multiecho pulse sequences of the brain and surrounding structures were obtained without and with intravenous contrast. CONTRAST:  Gadavist 7 mL. COMPARISON:  CT head earlier today. FINDINGS: Brain: There is Estephan Gallardo large intra-axial posterior LEFT temporal mass, 23 x 27 x 19 mm. Extensive vasogenic edema. No uncal herniation. No midline shift. The lesion is T2 hypointense, T1 hyperintense, marked susceptibility on gradient sequence, and avid postcontrast enhancement. Some areas of central heterogeneity could represent necrosis. Koleen Celia second lesion, 5 mm, is identified, LEFT inferior frontal lobe near the gyrus rectus. Lesion is too small to accurately characterize except as that of Xcaret Morad probable metastasis. Generalized atrophy.  Minor white matter disease. Vascular: Normal flow voids. Skull and upper cervical spine: Normal marrow signal. Sinuses/Orbits: Negative. Other: None. IMPRESSION: Two separate enhancing lesions in the brain, with the dominant lesion in the LEFT posterior temporal lobe, and Selby Foisy subcentimeter lesion in the LEFT frontal lobe, are strongly suggestive of intracranial metastatic disease, unknown primary. Moderate vasogenic edema, but no uncal herniation or midline shift. Imaging characteristics of the larger lesion, with T1 shortening, T2 shortening, and susceptibility on gradient sequence suggest either Fernado Brigante metastatic hemorrhagic neoplasm or metastatic melanoma. Electronically Signed   By: Elsie Stain M.D.   On: 06/16/2018 18:47   Ct Abdomen Pelvis W Contrast  Result Date: 06/16/2018 CLINICAL DATA:  Neoplasm suspected EXAM: CT CHEST,  ABDOMEN, AND PELVIS WITH CONTRAST TECHNIQUE: Multidetector CT imaging of the chest, abdomen and pelvis was performed following the standard protocol during bolus administration of intravenous contrast. CONTRAST:  OMNIPAQUE IOHEXOL 300 MG/ML  SOLN COMPARISON:  CT abdomen and pelvis 09/25/2012 FINDINGS: CT CHEST FINDINGS Cardiovascular: Heart is normal size. Moderate scattered coronary artery calcifications and aortic calcifications. No evidence of aortic aneurysm. Mediastinum/Nodes: Mildly prominent subcarinal lymph node with Shahira Fiske short axis diameter of 11 mm. No hilar or axillary adenopathy. Lungs/Pleura: Rounded masslike airspace opacity noted in the left lower lobe measuring up to 6.8 cm concerning for malignancy. Small left pleural effusion. Right lung clear. Scarring in the lingula. Scattered air-filled cystic areas in the lungs bilaterally. Musculoskeletal: Chest wall soft tissues are unremarkable. No acute bony abnormality. CT ABDOMEN PELVIS FINDINGS Hepatobiliary: No focal hepatic abnormality. Gallbladder unremarkable. Pancreas: No focal abnormality or ductal dilatation. Spleen: No focal abnormality.  Normal size. Adrenals/Urinary Tract: Bilateral adrenal nodules, 2.4 cm on the left and 11 mm on the right. No renal mass or hydronephrosis. Urinary bladder unremarkable. Stomach/Bowel: Moderate stool burden within the colon. Normal appendix. Stomach, large and small bowel grossly unremarkable. Vascular/Lymphatic: Aortic atherosclerosis. Patient is status post aortobifemoral bypass. The right iliofemoral limb is occluded. No adenopathy. Reproductive: No visible focal abnormality. Other: No free fluid or free air. Musculoskeletal: No acute bony abnormality. IMPRESSION: 6.8 cm mass in the medial left lower lobe concerning for primary lung cancer. This could be further evaluated with PET CT anti she would diagnosis. Small left effusion. Coronary artery disease, aortic atherosclerosis. Small bilateral adrenal  nodules, nonspecific. Cannot exclude metastases. Prior aortobifemoral bypass. The right iliofemoral limb is occluded. Electronically Signed   By: Charlett Nose M.D.   On: 06/16/2018 23:02        Scheduled Meds:  clonazePAM  1 mg Oral TID   cloZAPine  200 mg Oral BID   dexamethasone  4 mg Intravenous Q6H   donepezil  10 mg Oral QHS   gabapentin  300 mg Oral TID   haloperidol  5 mg Oral BID   insulin aspart  0-9 Units Subcutaneous Q4H   lactulose  30 g Oral BID   lisinopril  5 mg Oral Daily   metoprolol tartrate  25 mg Oral BID   pantoprazole  40 mg Oral Daily   senna-docusate  2 tablet Oral QHS   simvastatin  5 mg Oral QHS   trihexyphenidyl  5 mg Oral BID   Continuous Infusions:    LOS: 1 day    Time spent: over 30 min    Lacretia Nicks, MD [facility] Pager AMION  If 7PM-7AM, please contact night-coverage [email or web address] Password [name] 06/18/2018, 1:16 PM
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
## 2018-06-19 DIAGNOSIS — C7931 Secondary malignant neoplasm of brain: Secondary | ICD-10-CM

## 2018-06-19 DIAGNOSIS — Z515 Encounter for palliative care: Secondary | ICD-10-CM

## 2018-06-19 DIAGNOSIS — Z66 Do not resuscitate: Secondary | ICD-10-CM

## 2018-06-19 LAB — COMPREHENSIVE METABOLIC PANEL
ALT: 14 U/L (ref 0–44)
AST: 14 U/L — ABNORMAL LOW (ref 15–41)
Albumin: 2.8 g/dL — ABNORMAL LOW (ref 3.5–5.0)
Alkaline Phosphatase: 62 U/L (ref 38–126)
Anion gap: 10 (ref 5–15)
BUN: 15 mg/dL (ref 8–23)
CO2: 25 mmol/L (ref 22–32)
Calcium: 9.4 mg/dL (ref 8.9–10.3)
Chloride: 108 mmol/L (ref 98–111)
Creatinine, Ser: 0.66 mg/dL (ref 0.61–1.24)
GFR calc Af Amer: >60 mL/min (ref >60)
GFR calc non Af Amer: >60 mL/min (ref >60)
Glucose, Bld: 263 mg/dL — ABNORMAL HIGH (ref 70–99)
Potassium: 3.4 mmol/L — ABNORMAL LOW (ref 3.5–5.1)
Sodium: 143 mmol/L (ref 135–145)
Total Bilirubin: 0.1 mg/dL — ABNORMAL LOW (ref 0.3–1.2)
Total Protein: 6.7 g/dL (ref 6.5–8.1)

## 2018-06-19 LAB — GLUCOSE, CAPILLARY
Glucose-Capillary: 181 mg/dL — ABNORMAL HIGH (ref 70–99)
Glucose-Capillary: 195 mg/dL — ABNORMAL HIGH (ref 70–99)
Glucose-Capillary: 197 mg/dL — ABNORMAL HIGH (ref 70–99)
Glucose-Capillary: 205 mg/dL — ABNORMAL HIGH (ref 70–99)
Glucose-Capillary: 246 mg/dL — ABNORMAL HIGH (ref 70–99)
Glucose-Capillary: 264 mg/dL — ABNORMAL HIGH (ref 70–99)
Glucose-Capillary: 296 mg/dL — ABNORMAL HIGH (ref 70–99)

## 2018-06-19 LAB — LEGIONELLA PNEUMOPHILA SEROGP 1 UR AG: L. pneumophila Serogp 1 Ur Ag: NEGATIVE

## 2018-06-19 LAB — CBC
HCT: 32.9 % — ABNORMAL LOW (ref 39.0–52.0)
Hemoglobin: 9.5 g/dL — ABNORMAL LOW (ref 13.0–17.0)
MCH: 23.4 pg — ABNORMAL LOW (ref 26.0–34.0)
MCHC: 28.9 g/dL — ABNORMAL LOW (ref 30.0–36.0)
MCV: 81 fL (ref 80.0–100.0)
Platelets: 448 10*3/uL — ABNORMAL HIGH (ref 150–400)
RBC: 4.06 MIL/uL — ABNORMAL LOW (ref 4.22–5.81)
RDW: 15.9 % — ABNORMAL HIGH (ref 11.5–15.5)
WBC: 18.1 10*3/uL — ABNORMAL HIGH (ref 4.0–10.5)
nRBC: 0.3 % — ABNORMAL HIGH (ref 0.0–0.2)

## 2018-06-19 LAB — MAGNESIUM: Magnesium: 1.7 mg/dL (ref 1.7–2.4)

## 2018-06-19 MED ORDER — DEXAMETHASONE SODIUM PHOSPHATE 10 MG/ML IJ SOLN
4.0000 mg | Freq: Two times a day (BID) | INTRAMUSCULAR | Status: DC
  Administered 2018-06-19: 4 mg via INTRAVENOUS
  Filled 2018-06-19: qty 1

## 2018-06-19 MED ORDER — CEPHALEXIN 500 MG PO CAPS: 500.0000 mg | ORAL_CAPSULE | Freq: Four times a day (QID) | ORAL | Status: DC

## 2018-06-19 MED ORDER — INSULIN GLARGINE 100 UNIT/ML ~~LOC~~ SOLN
10.0000 [IU] | Freq: Every day | SUBCUTANEOUS | Status: DC
  Administered 2018-06-19 – 2018-06-20 (×2): 10 [IU] via SUBCUTANEOUS
  Filled 2018-06-19 (×3): qty 0.1

## 2018-06-19 NOTE — NC FL2 (Signed)
[place] LEVEL OF CARE SCREENING TOOL     IDENTIFICATION  Patient Name: Craig Bentley Birthdate: [birth year] Sex: male Admission Date (Current Location): 06/16/2018  [facility] and [place] Number:  Herbalist and Address:  [facility] [place], [place] [address] [place], [place] [address]      Provider Number: [id number]  Attending Physician Name and Address:  Elodia Florence., *  Relative Name and Phone Number:       Current Level of Care: Hospital Recommended Level of Care: Assisted Living Facility(with hospice) Prior Approval Number:    Date Approved/Denied:   PASRR Number:    Discharge Plan: Other (Comment)(ALF with hospice)    Current Diagnoses: Patient Active Problem List   Diagnosis Date Noted  . Palliative care by specialist   . 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 DNR (do not resuscitate)   . Brain metastases ([place]) 06/17/2018  . Brain metastasis ([place]) 06/16/2018  . CAP (community acquired pneumonia) 06/16/2018  . 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 Low grade fever   . Surgical wound dehiscence   . Opacity of lung on imaging study   . Acute blood loss anemia   . Fever   . SIRS (systemic inflammatory response syndrome) (HCC)   . Leukocytosis   . Status post below knee amputation of right lower extremity ([place]) 08/28/2015  . 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[place]) 08/23/2015  . Sepsis ([place]) 08/21/2015  . ARF (acute renal failure) ([place]) 08/21/2015  . Acute encephalopathy 08/21/2015  . Post-operative state   . Respiratory failure ([place])   . Surgery, elective   . Anxiety   . Paranoid schizophrenia ([place])   . Atherosclerosis of extremity with gangrene ([place])   . Hyperglycemia   . Preoperative cardiovascular examination 04/11/2015  . PVD (peripheral vascular disease) ([place]) 04/10/2015  . Absolute anemia   . Diabetic foot infection ([place]) 04/02/2015  . Right foot pain  04/02/2015  . Neutrophilic leukocytosis 05/39/7673  . Esophageal abnormality   . HCAP (healthcare-associated pneumonia)   . COPD exacerbation ([place])   . Dyspnea   . Respiratory distress   . Sepsis due to pneumonia ([place]) 02/14/2015  . Acute respiratory failure with hypoxia ([place]) 02/13/2015  . Diabetes mellitus with neurologic complication, without long-term current use of insulin ([place]) 07/20/2011  . Essential hypertension   . COPD (chronic obstructive pulmonary disease) (HCC)     Orientation RESPIRATION BLADDER Height & Weight     Self, Place  Normal Continent Weight: 170 lb 3.1 oz (77.2 kg) Height:  5' 7.99" (172.7 cm)  BEHAVIORAL SYMPTOMS/MOOD NEUROLOGICAL BOWEL NUTRITION STATUS  (None) (None) Continent Diet(Heart healthy/carb modified.)  AMBULATORY STATUS COMMUNICATION OF NEEDS Skin   Total Care Verbally Other (Comment)(Cracking.)                       Personal Care Assistance Level of Assistance              Functional Limitations Info  Sight, Speech, Hearing Sight Info: Adequate Hearing Info: Adequate Speech Info: Impaired(Slurred/Dysarthria)    SPECIAL CARE FACTORS FREQUENCY                       Contractures Contractures Info: Not present    Additional Factors Info  Code Status, Allergies, Psychotropic Code Status Info: DNR Allergies Info: NKDA Psychotropic Info: Paranoid Schizophrenia: Klonopin 1 mg PO TID, Clozaril  200 mg PO BID, Haldol 5 mg PO BID.         Current Medications (06/19/2018):  This is the current hospital active medication list Current Facility-Administered Medications  Medication Dose Route Frequency Provider Last Rate Last Dose  . acetaminophen (TYLENOL) tablet 650 mg  650 mg Oral Q6H PRN Elodia Florence., MD       Or  . acetaminophen (TYLENOL) suppository 650 mg  650 mg Rectal Q6H PRN Elodia Florence., MD      . cefTRIAXone (ROCEPHIN) 1 g in sodium chloride 0.9 % 100 mL IVPB  1 g Intravenous Q24H Elodia Florence., MD 200 mL/hr at 06/18/18 1556 1 g at 06/18/18 1556  . clonazePAM (KLONOPIN) tablet 1 mg  1 mg Oral TID Elodia Florence., MD   1 mg at 06/19/18 1020  . cloZAPine (CLOZARIL) tablet 200 mg  200 mg Oral BID Elodia Florence., MD   200 mg at 06/19/18 1021  . dexamethasone (DECADRON) injection 4 mg  4 mg Intravenous Q12H Dellinger, Marianne L, PA-C      . gabapentin (NEURONTIN) capsule 300 mg  300 mg Oral TID Elodia Florence., MD   300 mg at 06/19/18 1020  . haloperidol (HALDOL) tablet 5 mg  5 mg Oral BID Elodia Florence., MD   5 mg at 06/19/18 1021  . insulin aspart (novoLOG) injection 0-9 Units  0-9 Units Subcutaneous Q4H Elodia Florence., MD   2 Units at 06/19/18 1341  . insulin glargine (LANTUS) injection 10 Units  10 Units Subcutaneous Daily Elodia Florence., MD   10 Units at 06/19/18 [phone]  . lactulose (CHRONULAC) 10 GM/15ML solution 30 g  30 g Oral BID Elodia Florence., MD   30 g at 06/19/18 1020  . lisinopril (ZESTRIL) tablet 5 mg  5 mg Oral Daily Elodia Florence., MD   5 mg at 06/19/18 1021  . metoprolol tartrate (LOPRESSOR) tablet 25 mg  25 mg Oral BID Elodia Florence., MD   25 mg at 06/19/18 1020  . ondansetron (ZOFRAN) tablet 4 mg  4 mg Oral Q6H PRN Elodia Florence., MD       Or  . ondansetron [facility]) injection 4 mg  4 mg Intravenous Q6H PRN Elodia Florence., MD      . pantoprazole (PROTONIX) EC tablet 40 mg  40 mg Oral Daily Elodia Florence., MD   40 mg at 06/19/18 1021  . senna-docusate (Senokot-S) tablet 2 tablet  2 tablet Oral QHS Elodia Florence., MD   2 tablet at 06/18/18 2124  . trihexyphenidyl (ARTANE) tablet 5 mg  5 mg Oral BID Elodia Florence., MD   5 mg at 06/19/18 1021     Discharge Medications: Please see discharge summary for a list of discharge medications.  Relevant Imaging Results:  Relevant Lab Results:   Additional Information SS#: [id number]  Candie Chroman,  LCSW

## 2018-06-19 NOTE — Evaluation (Signed)
Occupational Therapy Evaluation Patient Details Name: Craig Bentley MRN: [id number] DOB: 1955-07-20 Today's Date: 06/19/2018    History of Present Illness Craig Bentley is a 63 y.o. male with history of schizophrenia, dementia, COPD, diabetes mellitus, peripheral vascular disease, hypertension was brought to the ER from group home after patient was found to be weak and not himself for last 24 hours. CT revealed 2 brain lesions. Suspecting metastatic lung cancer. Admitted for work up.   Clinical Impression   PT admitted with 2 brain lesions. Pt currently with functional limitiations due to the deficits listed below (see OT problem list). Pt appears to be close to baseline of care required prior to admission.  Pt will benefit from skilled OT to increase their independence and safety with adls and balance to allow discharge hospice level care in chart at this time. .     Follow Up Recommendations  No OT follow up;Supervision - Intermittent    Equipment Recommendations  None recommended by OT    Recommendations for Other Services       Precautions / Restrictions Precautions Precautions: Fall Precaution Comments: psych history, R BKA Required Braces or Orthoses: Other Brace Other Brace: hasn't use a prosthesis in 3 years due to improper fitting  Restrictions Weight Bearing Restrictions: No      Mobility Bed Mobility Overal bed mobility: Needs Assistance Bed Mobility: Supine to Sit;Rolling Rolling: Supervision   Supine to sit: Min guard        Transfers Overall transfer level: Needs assistance Equipment used: None Transfers: Lateral/Scoot Transfers          Lateral/Scoot Transfers: Min assist General transfer comment: transfer to the L side    Balance Overall balance assessment: Needs assistance Sitting-balance support: Feet unsupported;No upper extremity supported Sitting balance-Leahy Scale: Poor         Standing balance comment: doesn't stand with RW due to  fear of falling and not having R LE                           ADL either performed or assessed with clinical judgement   ADL Overall ADL's : Needs assistance/impaired             Lower Body Bathing: Supervison/ safety Lower Body Bathing Details (indicate cue type and reason): laying in bed to roll R and L  Upper Body Dressing : Supervision/safety       Toilet Transfer: Copy Details (indicate cue type and reason): transfer with lateral transfer           General ADL Comments: pt verbalized being wet to staff. pt in fact incontinence due to poor condom cath placement     Vision         Perception     Praxis      Pertinent Vitals/Pain Pain Assessment: No/denies pain     Hand Dominance Right   Extremity/Trunk Assessment Upper Extremity Assessment Upper Extremity Assessment: Overall WFL for tasks assessed       Cervical / Trunk Assessment Cervical / Trunk Assessment: Normal   Communication Communication Communication: No difficulties   Cognition Arousal/Alertness: Awake/alert Behavior During Therapy: WFL for tasks assessed/performed Overall Cognitive Status: History of cognitive impairments - at baseline                                 General Comments: has dementia and schizophrenia  General Comments       Exercises     Shoulder Instructions      Home Living Family/patient expects to be discharged to:: Group home                                 Additional Comments: has a ramp to get in, 24/7 assist, one level, primarily uses a w/c for mobility, baths at sink      Prior Functioning/Environment Level of Independence: Needs assistance  Gait / Transfers Assistance Needed: reports he hasn't walked in 3 years, uses manual w/c ADL's / Homemaking Assistance Needed: baths at sink, assist for back             OT Problem List: Decreased strength      OT  Treatment/Interventions: Self-care/ADL training;Therapeutic exercise;Energy conservation;DME and/or AE instruction;Therapeutic activities;Balance training;Patient/family education    OT Goals(Current goals can be found in the care plan section) Acute Rehab OT Goals Patient Stated Goal: go home to the place his sister wants him to go OT Goal Formulation: Patient unable to participate in goal setting Time For Goal Achievement: 07/03/18 Potential to Achieve Goals: Good  OT Frequency: Min 1X/week   Barriers to D/C:            Co-evaluation              AM-PAC OT "6 Clicks" Daily Activity     Outcome Measure Help from another person eating meals?: A Little Help from another person taking care of personal grooming?: A Little Help from another person toileting, which includes using toliet, bedpan, or urinal?: A Little Help from another person bathing (including washing, rinsing, drying)?: A Little Help from another person to put on and taking off regular upper body clothing?: A Little Help from another person to put on and taking off regular lower body clothing?: A Little 6 Click Score: 18   End of Session Nurse Communication: Mobility status;Precautions  Activity Tolerance: Patient tolerated treatment well Patient left: in bed;with call bell/phone within reach;with bed alarm set  OT Visit Diagnosis: Unsteadiness on feet (R26.81);Muscle weakness (generalized) (M62.81)                Time: 9323-5573 OT Time Calculation (min): 15 min Charges:  OT General Charges $OT Visit: 1 Visit OT Evaluation $OT Eval Moderate Complexity: 1 Mod   Jeri Modena, OTR/L  Acute Rehabilitation Services Pager: [phone] Office: [phone] .   Jeri Modena 06/19/2018, 12:15 PM

## 2018-06-19 NOTE — Progress Notes (Signed)
[place])  Received request from [place] for family interest in hospice services at [facility] following discharge. Chart being reviewed. Eligibility pending. Attempted contact with Tonya. Left voice message requesting call back. Will follow up with CSW Michiel Cowboy in am and continue to reach out to [place].   Erling Conte, [facility] Liaison [phone]

## 2018-06-19 NOTE — Progress Notes (Signed)
Daily Progress Note   Patient Name: Craig Bentley       Date: 06/19/2018 DOB: 1955/10/20  Age: 63 y.o. MRN#: [id number] Attending Physician: Elodia Florence., * Primary Care Physician: Megan Mans, NP Admit Date: 06/16/2018  Reason for Consultation/Follow-up: Establishing goals of care  Subjective: Spoke with patient who is comfortable.  He asks me again if he can go live at [facility].  He asks if he has to go to [facility].  He appears very comfortable in his room.  The RN has started music playing on You Tube for him.   I let him know the doctor and I were speaking with his family today.  He was happy about that and wanted to know what all of his siblings thought.  Dr. Florene Glen and I spoke with Shelly Bombard and Glynn Octave about Cristino Martes.  They want to honor his wishes.  No surgery.  No oncologic treatment.  They want him cared for and comfortable.  They asked about prognosis.  We responded that it is likely he has less than 6 months and there fore is eligible for hospice services.  The family gratefully accepted hospice services in helping to care for Perry.    Dr. Florene Glen brought up code status.  The siblings felt that intubation and life support are not in line with Dora's wishes.  Consequently will will change code status to DNR.   Assessment: Pleasant.  Comfortable.  Consistent in his desire to avoid surgery or oncologic therapy.   Patient Profile/HPI:  63 y.o. male  with past medical history of schitzophrenia, DM, PVD s/p bka COPD, constipation, anxiety who was admitted on 06/16/2018 with weakness and a fall. Imaging revealed 2 lesions on his brain (2.7 cm left temporal mass and 0.5 cm left frontal mass) as well as 1 lung lesion 6.9 cm.  CXR showed infiltrates suspicious for  pneumonia.  Psychiatry has seen Mr. Zerbe and deemed that he does not have capacity to make his own health care decisions.   Length of Stay: 2  Current Medications: Scheduled Meds:  . clonazePAM  1 mg Oral TID  . cloZAPine  200 mg Oral BID  . dexamethasone  4 mg Intravenous Q12H  . gabapentin  300 mg Oral TID  . haloperidol  5 mg Oral BID  .  insulin aspart  0-9 Units Subcutaneous Q4H  . insulin glargine  10 Units Subcutaneous Daily  . lactulose  30 g Oral BID  . lisinopril  5 mg Oral Daily  . metoprolol tartrate  25 mg Oral BID  . pantoprazole  40 mg Oral Daily  . senna-docusate  2 tablet Oral QHS  . trihexyphenidyl  5 mg Oral BID    Continuous Infusions: . cefTRIAXone (ROCEPHIN)  IV 1 g (06/18/18 1556)    PRN Meds: acetaminophen **OR** acetaminophen, ondansetron **OR** ondansetron (ZOFRAN) IV  Physical Exam        Very pleasant, edentulous gentleman, awake, alert, responsive CV rrr resp no distress on room air. RLE BKA   Vital Signs: BP (!) 180/75 (BP Location: Left Arm)   Pulse 73   Temp 97.8 F (36.6 C)   Resp (!) 22   Ht 5' 7.99" (1.727 m)   Wt 77.2 kg   SpO2 94%   BMI 25.88 kg/m  SpO2: SpO2: 94 % O2 Device: O2 Device: Room Air O2 Flow Rate:    Intake/output summary:   Intake/Output Summary (Last 24 hours) at 06/19/2018 1113 Last data filed at 06/19/2018 0500 Gross per 24 hour  Intake 340 ml  Output 1600 ml  Net -1260 ml   LBM: Last BM Date: 06/17/18 Baseline Weight: Weight: 72.6 kg Most recent weight: Weight: 77.2 kg       Palliative Assessment/Data:   40%      Patient Active Problem List   Diagnosis Date Noted  . Palliative care by specialist   . Brain metastases ([place]) 06/17/2018  . Brain metastasis ([place]) 06/16/2018  . CAP (community acquired pneumonia) 06/16/2018  . 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 Low grade fever   . Surgical wound dehiscence   . Opacity of lung on imaging study   .  Acute blood loss anemia   . Fever   . SIRS (systemic inflammatory response syndrome) (HCC)   . Leukocytosis   . Status post below knee amputation of right lower extremity ([place]) 08/28/2015  . 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[place]) 08/23/2015  . Sepsis ([place]) 08/21/2015  . ARF (acute renal failure) ([place]) 08/21/2015  . Acute encephalopathy 08/21/2015  . Post-operative state   . Respiratory failure ([place])   . Surgery, elective   . Anxiety   . Paranoid schizophrenia ([place])   . Atherosclerosis of extremity with gangrene ([place])   . Hyperglycemia   . Preoperative cardiovascular examination 04/11/2015  . PVD (peripheral vascular disease) ([place]) 04/10/2015  . Absolute anemia   . Diabetic foot infection ([place]) 04/02/2015  . Right foot pain 04/02/2015  . Neutrophilic leukocytosis 40/98/1191  . Esophageal abnormality   . HCAP (healthcare-associated pneumonia)   . COPD exacerbation ([place])   . Dyspnea   . Respiratory distress   . Sepsis due to pneumonia ([place]) 02/14/2015  . Acute respiratory failure with hypoxia ([place]) 02/13/2015  . Diabetes mellitus with neurologic complication, without long-term current use of insulin ([place]) 07/20/2011  . Essential hypertension   . COPD (chronic obstructive pulmonary disease) [facility])     Palliative Care Plan    Recommendations/Plan:  Change code status to DNR.  Treat the treatable with minimally invasive measures.  Otherwise focus on comfort.  Will DC statin and aricept to minimize pill burden.  Continue Haldol, clonazepam, clozaril, .  Patient calm, happy comfortable.  Family would like to engage hospice services immediately on discharge (this will assist the patient and decrease the likelihood  of readmission for discomfort).  Will need 24 hour monitoring on discharge.  Goals of Care and Additional Recommendations:  Limitations on Scope of Treatment: Avoid Hospitalization, No Chemotherapy, No Radiation and No Surgical Procedures  Code Status:  DNR  Prognosis:    < 6 months secondary to presumed lung cancer with metastases to the brain.   Discharge Planning:  To Be Determined  Care plan was discussed with Dr. Florene Glen, patient, and siblings Kenney Houseman and Chrissie Noa.  Thank you for allowing the Palliative Medicine Team to assist in the care of this patient.  Total time spent:  60 min.     Greater than 50%  of this time was spent counseling and coordinating care related to the above assessment and plan.  Florentina Jenny, PA-C Palliative Medicine  Please contact Palliative MedicineTeam phone at [phone] for questions and concerns between 7 am - 7 pm.   Please see AMION for individual provider pager numbers.

## 2018-06-19 NOTE — Evaluation (Signed)
Physical Therapy Evaluation Patient Details Name: Craig Bentley MRN: [id number] DOB: 1955-10-10 Today's Date: 06/19/2018   History of Present Illness  Craig Bentley is a 63 y.o. male with history of schizophrenia, dementia, COPD, diabetes mellitus, peripheral vascular disease, hypertension was brought to the ER from group home after patient was found to be weak and not himself for last 24 hours. CT revealed 2 brain lesions. Suspecting metastatic lung cancer. Admitted for work up.  Clinical Impression  Pt admitted with above. Pt non-ambulatory at baseline and was able to complete lateral scoot transfer to drop arm chair with minA. Pt with noted impaired sequencing and delayed processing. Unsure of medical plan as patient doesn't want treatment if the lesions are cancerous, however pt also with psych history and awaiting Psych eval for capacity to make his medical decisions. Acute PT to cont to follow to progress mobility as we see appropriate with medical plan. Pt states he isn't going back to his rest home, that he's going to a different place that his sister found (unsure what kind of facility she is discussing with him).    Follow Up Recommendations No PT follow up;Supervision/Assistance - 24 hour(pending medical plan)    Equipment Recommendations  None recommended by PT    Recommendations for Other Services       Precautions / Restrictions Precautions Precautions: Fall Precaution Comments: psych history, R BKA Required Braces or Orthoses: Other Brace Other Brace: hasn't use a prosthesis in 3 years due to improper fitting  Restrictions Weight Bearing Restrictions: No      Mobility  Bed Mobility Overal bed mobility: Needs Assistance Bed Mobility: Supine to Sit     Supine to sit: Min assist     General bed mobility comments: max directional v/c's, increased time, labored effort to elevate trunk, hob elevated  Transfers Overall transfer level: Needs assistance Equipment used:  None Transfers: Lateral/Scoot Transfers          Lateral/Scoot Transfers: Min assist General transfer comment: max directional verbal cues, min guard for safety, pt unable to process sequence requiring constant directional verbal cues, pt able to follow commands and complete transfer without physical assist, pt scooted to the L from bed into drop arm chair  Ambulation/Gait             General Gait Details: pt non-ambulatory  Stairs            Wheelchair Mobility    Modified Rankin (Stroke Patients Only)       Balance Overall balance assessment: Needs assistance Sitting-balance support: Feet unsupported;No upper extremity supported Sitting balance-Leahy Scale: Poor         Standing balance comment: doesn't stand with RW due to fear of falling and not having R LE                             Pertinent Vitals/Pain Pain Assessment: No/denies pain    Home Living Family/patient expects to be discharged to:: Group home                 Additional Comments: has a ramp to get in, 24/7 assist, one level, primarily uses a w/c for mobility, baths at sink    Prior Function Level of Independence: Needs assistance   Gait / Transfers Assistance Needed: reports he hasn't walked in 3 years, uses manual w/c  ADL's / Homemaking Assistance Needed: baths at sink, assist for back  Hand Dominance   Dominant Hand: Right    Extremity/Trunk Assessment   Upper Extremity Assessment Upper Extremity Assessment: Generalized weakness(L appears weaker than R)    Lower Extremity Assessment Lower Extremity Assessment: Generalized weakness(R LE with BKA)    Cervical / Trunk Assessment Cervical / Trunk Assessment: Normal  Communication   Communication: No difficulties  Cognition Arousal/Alertness: Awake/alert Behavior During Therapy: WFL for tasks assessed/performed Overall Cognitive Status: History of cognitive impairments - at baseline                                  General Comments: has dementia and schizophrenia      General Comments General comments (skin integrity, edema, etc.): VSS    Exercises     Assessment/Plan    PT Assessment Patient needs continued PT services  PT Problem List Decreased strength;Decreased activity tolerance;Decreased balance;Decreased mobility;Decreased range of motion;Decreased knowledge of use of DME;Decreased safety awareness;Decreased cognition       PT Treatment Interventions Stair training;DME instruction;Gait training;Functional mobility training;Therapeutic activities;Therapeutic exercise;Balance training;Neuromuscular re-education;Cognitive remediation    PT Goals (Current goals can be found in the Care Plan section)  Acute Rehab PT Goals Patient Stated Goal: go home to the place his sister wants him to go PT Goal Formulation: With patient Time For Goal Achievement: 07/03/18 Potential to Achieve Goals: Good    Frequency Min 2X/week   Barriers to discharge        Co-evaluation               AM-PAC PT "6 Clicks" Mobility  Outcome Measure Help needed turning from your back to your side while in a flat bed without using bedrails?: A Little Help needed moving from lying on your back to sitting on the side of a flat bed without using bedrails?: A Little Help needed moving to and from a bed to a chair (including a wheelchair)?: A Little Help needed standing up from a chair using your arms (e.g., wheelchair or bedside chair)?: Total Help needed to walk in hospital room?: Total Help needed climbing 3-5 steps with a railing? : Total 6 Click Score: 12    End of Session   Activity Tolerance: Patient tolerated treatment well Patient left: in chair;with call bell/phone within reach;with chair alarm set;with nursing/sitter in room Nurse Communication: Mobility status PT Visit Diagnosis: Unsteadiness on feet (R26.81);Muscle weakness (generalized) (M62.81)    Time:  1660-6301 PT Time Calculation (min) (ACUTE ONLY): 20 min   Charges:   PT Evaluation $PT Eval Moderate Complexity: 1 Mod          Kittie Plater, PT, DPT Acute Rehabilitation Services Pager #: [phone] Office #: [phone]   Berline Lopes 06/19/2018, 8:12 AM

## 2018-06-19 NOTE — Progress Notes (Signed)
PROGRESS NOTE    Craig Bentley:[id number] DOB: May 16, 1955 DOA: 06/16/2018 PCP: Craig Mering, NP   Brief Narrative:  Craig Bentley is Craig Bentley 63 y.o. male with history of schizophrenia, dementia, COPD, diabetes mellitus, peripheral vascular disease, hypertension was brought to the ER from group home after patient was found to be weak and not himself for last 24 hours.  Per the report obtained by the ER physician patient had Craig Bentley fall also.  No complaints of any chest pain shortness of breath nausea vomiting or diarrhea.  ED Course: In the ER patient appeared nonfocal.  Mildly confused.  At times hallucinating.  MRI of the brain shows 2 lesions concerning for metastatic lesions in the brain.  On-call neurosurgeon was consulted and admitted for further management of further metastatic work-up and weakness.  Lab works show creatinine of 0.6 glucose 151 AST 18 ALT 14 hemoglobin is 9.4 platelets 409.  Chest x-ray was showing infiltrates concerning for pneumonia for which patient was started on empiric antibiotics and COVID-19 was ordered which was negative.  Pt found to have metastatic cancer to brain with lung as likely primary.  Complicated by pt schizophrenia and lack of capacity.  Discussed with family on 4/27 who've decided on hospice.  Planning on d/c 4/28 with hospice.  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 Metastatic brain lesions   6.8 cm Mass in Medial L Lower Lobe concerning for primary lung cancer:   1. Appreciate oncology consult, see 4/25 note.  Likely metastatic lung cancer. 2. Appreciate psych consult, pt lacks capacity to make decisions regarding his likely malignancy.  The  patient says that he wants to go home.  He does not want Craig Bentley biopsy or treatment for his cancer.  He says he wants to die at home. 3. Given lack of capacity, discussed with pt family Craig Bentley and Craig Bentley) with palliative care today.  Had discussion with palliative care and family regarding likely diagnosis, prognosis, brother's lack of decision making capacity.  Family decided on palliative approach in light of what Craig Bentley has been saying regarding treatment.  Also thought DNR would be consistent with wishes as well.   4. Neurosurgery recommended metastatic workup and diagnosis via bx of potential primary - recommended med/rad onc c/s 5. Will need to be discharged on oral decadron.  6. Appreciate Craig Bentley assistance.  2. ?Capacity:  Pt lacks capacity to make decisions regarding above due per psych c/s on 4/26. 1. Appreciate psychiatry assistance, pt lacks capacity  2. Of note, his sister's numbers are [phone] and [phone] Craig Bentley) 3. Will work with palliative care and his sister to make Craig Bentley plan going forward.  3. Craig Bentley UTI: transition to keflex.  4. Type 2 Diabetes mellitus:  SSI.  Lantus started today.  Note that patient is on Decadron.  5. Paranoid schizophrenia on Haldol Clozaril and trihexyphenidyl. 6. Anxiety on Klonopin. 7. Dementia on Aricept. 8. Hypertension on lisinopril and metoprolol. 9. Anemia appears to be chronic follow CBC. 10. Possible pneumonia for which patient is on empiric antibiotics.  COVID-19 was negative.  CT chest without pneumonia.  Will stop abx for pna. 11. COPD -not actively wheezing. 12. Hyperlipidemia on  statins. 13. History of peripheral vascular disease.  DVT prophylaxis: SCD Code Status: full  Family Communication: sister 4/26.  Sister/brother 4/27. Disposition Plan: pending  Consultants:   psychiatry  Procedures:   none   Antimicrobials: Anti-infectives (From admission, onward)   Start     Dose/Rate Route Frequency Ordered Stop     06/18/18 1500  cefTRIAXone (ROCEPHIN) 1 g in sodium chloride 0.9 % 100 mL IVPB     1 g 200 mL/hr over 30 Minutes Intravenous Every 24 hours 06/18/18 1337     06/16/18 2145  cefTRIAXone (ROCEPHIN) 1 g in sodium chloride 0.9 % 100 mL IVPB  Status:  Discontinued     1 g 200 mL/hr over 30 Minutes Intravenous Every 24 hours 06/16/18 2136 06/17/18 1747   06/16/18 2145  azithromycin (ZITHROMAX) 500 mg in sodium chloride 0.9 % 250 mL IVPB  Status:  Discontinued     500 mg 250 mL/hr over 60 Minutes Intravenous Every 24 hours 06/16/18 2136 06/17/18 1747   06/16/18 1500  doxycycline (VIBRA-TABS) tablet 100 mg     100 mg Oral  Once 06/16/18 1451 06/16/18 1459     Subjective: Asking to go to Craig Bentley. Focused on this today. Doesn't want anything done regarding cancer. No surgery.  Objective: Vitals:   06/19/18 0429 06/19/18 0754 06/19/18 1143 06/19/18 1709  BP:  (!) 180/75 (!) 189/81 (!) 170/78  Pulse:  73 67 81  Resp:  (!) 22 20 (!) 27  Temp:  97.8 F (36.6 C) 98 F (36.7 C) 98.6 F (37 C)  TempSrc:   Oral Oral  SpO2:  94%  96%  Weight: 77.2 kg     Height:        Intake/Output Summary (Last 24 hours) at 06/19/2018 1746 Last data filed at 06/19/2018 1500 Gross per 24 hour  Intake 600 ml  Output 2125 ml  Net -1525 ml   Filed Weights   06/17/18 0500 06/18/18 0458 06/19/18 0429  Weight: 77.1 kg 77.1 kg 77.2 kg    Examination:  General: No acute distress. Cardiovascular: Heart sounds show Craig Bentley regular rate, and rhythm.  Lungs: Clear to auscultation bilaterally  Abdomen: Soft, nontender, nondistended  Neurological: Alert. Moves all extremities 4 Cranial nerves II through XII grossly intact. Skin: Warm and dry. No rashes or lesions. Extremities: No clubbing or cyanosis. No edema. Psychiatric: confused, delusions      Data Reviewed: I have personally reviewed following labs and imaging studies  CBC: Recent Labs  Lab 06/16/18 1324 06/17/18 0440 06/19/18 0412  WBC 10.2  10.2 18.1*  NEUTROABS 7.4 7.3  --   HGB 9.4* 9.1* 9.5*  HCT 32.4* 31.2* 32.9*  MCV 81.8 80.2 81.0  PLT 409* 381 448*   Basic Metabolic Panel: Recent Labs  Lab 06/16/18 1324 06/17/18 0440 06/19/18 0412  NA 140 141 143  K 3.9 3.6 3.4*  CL 99 102 108  CO2 26 29 25   GLUCOSE 151* 116* 263*  BUN 10 12 15   CREATININE 0.61 0.72 0.66  CALCIUM 9.4 9.3 9.4  MG  --   --  1.7   GFR: Estimated Creatinine Clearance: 91.4 mL/min (by C-G formula based on SCr of 0.66 mg/dL). Liver Function Tests: Recent Labs  Lab 06/16/18 1324 06/17/18 0440 06/19/18 0412  AST 18 13* 14*  ALT 14 12 14   ALKPHOS 59 58 62  BILITOT 0.4 0.5 0.1*  PROT 6.9 6.4* 6.7  ALBUMIN 3.0* 2.7* 2.8*   No results for input(s): LIPASE, AMYLASE  in the last 168 hours. No results for input(s): AMMONIA in the last 168 hours. Coagulation Profile: No results for input(s): INR, PROTIME in the last 168 hours. Cardiac Enzymes: No results for input(s): CKTOTAL, CKMB, CKMBINDEX, TROPONINI in the last 168 hours. BNP (last 3 results) No results for input(s): PROBNP in the last 8760 hours. HbA1C: No results for input(s): HGBA1C in the last 72 hours. CBG: Recent Labs  Lab 06/19/18 0424 06/19/18 0824 06/19/18 1208 06/19/18 1222 06/19/18 1634  GLUCAP 246* 197* 195* 181* 205*   Lipid Profile: No results for input(s): CHOL, HDL, LDLCALC, TRIG, CHOLHDL, LDLDIRECT in the last 72 hours. Thyroid Function Tests: No results for input(s): TSH, T4TOTAL, FREET4, T3FREE, THYROIDAB in the last 72 hours. Anemia Panel: No results for input(s): VITAMINB12, FOLATE, FERRITIN, TIBC, IRON, RETICCTPCT in the last 72 hours. Sepsis Labs: No results for input(s): PROCALCITON, LATICACIDVEN in the last 168 hours.  Recent Results (from the past 240 hour(s))  Urine culture     Status: Abnormal   Collection Time: 06/16/18  5:11 PM  Result Value Ref Range Status   Specimen Description URINE, RANDOM  Final   Special Requests   Final     NONE Performed at [facility], [address] [place], [place] [address]    Culture >=100,000 COLONIES/mL Craig Bentley PNEUMONIAE (Havyn Ramo)  Final   Report Status 06/18/2018 FINAL  Final   Organism ID, Bacteria Craig Bentley PNEUMONIAE (Montez Cuda)  Final      Susceptibility   Craig Bentley pneumoniae - MIC*    AMPICILLIN RESISTANT Resistant     CEFAZOLIN <=4 SENSITIVE Sensitive     CEFTRIAXONE <=1 SENSITIVE Sensitive     CIPROFLOXACIN <=0.25 SENSITIVE Sensitive     GENTAMICIN <=1 SENSITIVE Sensitive     IMIPENEM <=0.25 SENSITIVE Sensitive     NITROFURANTOIN <=16 SENSITIVE Sensitive     TRIMETH/SULFA <=20 SENSITIVE Sensitive     AMPICILLIN/SULBACTAM <=2 SENSITIVE Sensitive     PIP/TAZO <=4 SENSITIVE Sensitive     Extended ESBL NEGATIVE Sensitive     * >=100,000 COLONIES/mL Craig Bentley PNEUMONIAE  SARS Coronavirus 2 [facility] order, Performed in [facility] hospital lab)     Status: None   Collection Time: 06/16/18  7:57 PM  Result Value Ref Range Status   SARS Coronavirus 2 NEGATIVE NEGATIVE Final    Comment: (NOTE) If result is NEGATIVE SARS-CoV-2 target nucleic acids are NOT DETECTED. The SARS-CoV-2 RNA is generally detectable in upper and lower  respiratory specimens during the acute phase of infection. The lowest  concentration of SARS-CoV-2 viral copies this assay can detect is 250  copies / mL. Sabrina Keough negative result does not preclude SARS-CoV-2 infection  and should not be used as the sole basis for treatment or other  patient management decisions.  Eliberto Sole negative result may occur with  improper specimen collection / handling, submission of specimen other  than nasopharyngeal swab, presence of viral mutation(s) within the  areas targeted by this assay, and inadequate number of viral copies  (<250 copies / mL). Sharlee Rufino negative result must be combined with clinical  observations, patient history, and epidemiological information. If result is POSITIVE SARS-CoV-2 target nucleic acids are DETECTED. The  SARS-CoV-2 RNA is generally detectable in upper and lower  respiratory specimens dur ing the acute phase of infection.  Positive  results are indicative of active infection with SARS-CoV-2.  Clinical  correlation with patient history and other diagnostic information is  necessary to determine patient infection status.  Positive results do  not rule out bacterial infection or co-infection with other viruses. If result is PRESUMPTIVE POSTIVE SARS-CoV-2 nucleic acids MAY BE PRESENT.   Tiara Bartoli presumptive positive result was obtained on the submitted specimen  and confirmed on repeat testing.  While 2019 novel coronavirus  (SARS-CoV-2) nucleic acids may be present in the submitted sample  additional confirmatory testing may be necessary for epidemiological  and / or clinical management purposes  to differentiate between  SARS-CoV-2 and other Sarbecovirus currently known to infect humans.  If clinically indicated additional testing with an alternate test  methodology [phone]) is advised. The SARS-CoV-2 RNA is generally  detectable in upper and lower respiratory sp ecimens during the acute  phase of infection. The expected result is Negative. Fact Sheet for Patients:  BoilerBrush.com.cy Fact Sheet for Healthcare Providers: https://pope.com/ This test is not yet approved or cleared by the [place] FDA and has been authorized for detection and/or diagnosis of SARS-CoV-2 by FDA under an Emergency Use Authorization (EUA).  This EUA will remain in effect (meaning this test can be used) for the duration of the COVID-19 declaration under Section 564(b)(1) of the Act, 21 U.S.C. section 360bbb-3(b)(1), unless the authorization is terminated or revoked sooner. Performed at [facility], [address], [place], [place] [address]   Respiratory Panel by PCR     Status: None   Collection Time: 06/16/18  7:57 PM  Result Value Ref Range Status    Adenovirus NOT DETECTED NOT DETECTED Final   Coronavirus 229E NOT DETECTED NOT DETECTED Final    Comment: (NOTE) The Coronavirus on the Respiratory Panel, DOES NOT test for the novel  Coronavirus (2019 nCoV)    Coronavirus HKU1 NOT DETECTED NOT DETECTED Final   Coronavirus NL63 NOT DETECTED NOT DETECTED Final   Coronavirus OC43 NOT DETECTED NOT DETECTED Final   Metapneumovirus NOT DETECTED NOT DETECTED Final   Rhinovirus / Enterovirus NOT DETECTED NOT DETECTED Final   Influenza Julieanne Hadsall NOT DETECTED NOT DETECTED Final   Influenza B NOT DETECTED NOT DETECTED Final   Parainfluenza Virus 1 NOT DETECTED NOT DETECTED Final   Parainfluenza Virus 2 NOT DETECTED NOT DETECTED Final   Parainfluenza Virus 3 NOT DETECTED NOT DETECTED Final   Parainfluenza Virus 4 NOT DETECTED NOT DETECTED Final   Respiratory Syncytial Virus NOT DETECTED NOT DETECTED Final   Bordetella pertussis NOT DETECTED NOT DETECTED Final   Chlamydophila pneumoniae NOT DETECTED NOT DETECTED Final   Mycoplasma pneumoniae NOT DETECTED NOT DETECTED Final    Comment: Performed at [facility], [address], [place], [place] [address]  MRSA PCR Screening     Status: None   Collection Time: 06/16/18 11:00 PM  Result Value Ref Range Status   MRSA by PCR NEGATIVE NEGATIVE Final    Comment:        The GeneXpert MRSA Assay (FDA approved for NASAL specimens only), is one component of Myleigh Amara comprehensive MRSA colonization surveillance program. It is not intended to diagnose MRSA infection nor to guide or monitor treatment for MRSA infections. Performed at [facility], [address] [place], [place] [address]   Culture, blood (routine x 2) Call MD if unable to obtain prior to antibiotics being given     Status: None (Preliminary result)   Collection Time: 06/16/18 11:02 PM  Result Value Ref Range Status   Specimen Description BLOOD RIGHT ANTECUBITAL  Final   Special Requests   Final    BOTTLES DRAWN AEROBIC ONLY Blood  Culture  results may not be optimal due to an inadequate volume of blood received in culture bottles   Culture   Final    NO GROWTH 3 DAYS Performed at [facility], [address] [place], [place] [address]    Report Status PENDING  Incomplete  Culture, blood (routine x 2) Call MD if unable to obtain prior to antibiotics being given     Status: None (Preliminary result)   Collection Time: 06/16/18 11:03 PM  Result Value Ref Range Status   Specimen Description BLOOD RIGHT ANTECUBITAL  Final   Special Requests   Final    BOTTLES DRAWN AEROBIC ONLY Blood Culture results may not be optimal due to an inadequate volume of blood received in culture bottles   Culture   Final    NO GROWTH 3 DAYS Performed at [facility], [address] [place], [place] [address]    Report Status PENDING  Incomplete         Radiology Studies: No results found.      Scheduled Meds:  clonazePAM  1 mg Oral TID   cloZAPine  200 mg Oral BID   dexamethasone  4 mg Intravenous Q12H   gabapentin  300 mg Oral TID   haloperidol  5 mg Oral BID   insulin aspart  0-9 Units Subcutaneous Q4H   insulin glargine  10 Units Subcutaneous Daily   lactulose  30 g Oral BID   lisinopril  5 mg Oral Daily   metoprolol tartrate  25 mg Oral BID   pantoprazole  40 mg Oral Daily   senna-docusate  2 tablet Oral QHS   trihexyphenidyl  5 mg Oral BID   Continuous Infusions:  cefTRIAXone (ROCEPHIN)  IV 1 g (06/19/18 1513)     LOS: 2 days    Time spent: over 30 min    Lacretia Nicks, MD [facility] Pager AMION  If 7PM-7AM, please contact night-coverage [email or web address] Password [facility] 06/19/2018, 5:46 PM

## 2018-06-19 NOTE — TOC Initial Note (Signed)
Transition of Care [facility]) - Initial/Assessment Note    Patient Details  Name: Craig Bentley MRN: [id number] Date of Birth: [birth year]  Transition of [facility]) CM/SW Contact:    Alexander Mt, [place] Phone Number: 06/19/2018, 4:09 PM  Clinical Narrative:                 CSW spoke with pt sister Kenney Houseman. Introduced self, role, reason for visit. Pt sister states that she would like to return home with assistance from hospice. Pt sister provided with choice of provider and she states she has no preference, approval received to call referral into [place].   Referral called to Audrea Muscat with [facility] and she will follow up with pt family.  [facility] aware and can take pt with hospice services. All this can be arranged for d/c tomorrow pending medical stability.   Will page and update MD.  Expected Discharge Plan: [place] [facility](with hospice) Barriers to Discharge: Continued Medical Work up(Hospice Referral)   Patient Goals and CMS Choice Patient states their goals for this hospitalization and ongoing recovery are:: for him to be comfortable- pt sister CMS Medicare.gov Compare Post Acute Care list provided to:: Patient Represenative (must comment) Choice offered to / list presented to : Sibling  Expected Discharge Plan and Services Expected Discharge Plan: [place] [facility](with hospice) In-house Referral: Clinical Social Work Discharge Planning Services: Other - See comment(Hospice Care) Post Acute Care Choice: Nursing Home, Hospice Living arrangements for the past 2 months: [place]                                      Prior Living Arrangements/Services Living arrangements for the past 2 months: [place]   Patient language and need for interpreter reviewed:: Yes(no needs) Do you feel safe going back to the place where you live?: Yes      Need for Family Participation in Patient Care: Yes (Comment)(decision  making) Care giver support system in place?: Yes (comment)(pt sister; facility resident)      Activities of Daily Living Home Assistive Devices/Equipment: Wheelchair ADL Screening (condition at time of admission) Patient's cognitive ability adequate to safely complete daily activities?: No Is the patient deaf or have difficulty hearing?: No Does the patient have difficulty seeing, even when wearing glasses/contacts?: No Does the patient have difficulty concentrating, remembering, or making decisions?: Yes Patient able to express need for assistance with ADLs?: Yes Does the patient have difficulty dressing or bathing?: Yes Independently performs ADLs?: No Communication: Appropriate for developmental age Dressing (OT): Needs assistance Is this a change from baseline?: Pre-admission baseline Grooming: Needs assistance Is this a change from baseline?: Pre-admission baseline Feeding: Appropriate for developmental age Bathing: Needs assistance Is this a change from baseline?: Pre-admission baseline Toileting: Needs assistance Is this a change from baseline?: Pre-admission baseline In/Out Bed: Needs assistance Is this a change from baseline?: Pre-admission baseline Walks in Home: Needs assistance Is this a change from baseline?: Pre-admission baseline Does the patient have difficulty walking or climbing stairs?: Yes Weakness of Legs: Both Weakness of Arms/Hands: None  Permission Sought/Granted Permission sought to share information with : Facility Sport and exercise psychologist, Family Supports, Other (comment) Permission granted to share information with : No(pt disoriented)  Share Information with NAME: Lucian Baswell  Permission granted to share info w AGENCY: [facility]  Permission granted to share info w Relationship: sister  Permission  granted to share info w Contact Information:  [phone] and [phone]  Emotional Assessment Appearance:: Other (Comment  Required(spoke with family member) Attitude/Demeanor/Rapport: Unable to Assess Affect (typically observed): Unable to Assess Orientation: : Fluctuating Orientation (Suspected and/or reported Sundowners), Oriented to Self Alcohol / Substance Use: Tobacco Use Psych Involvement: Yes (comment), Outpatient Provider  Admission diagnosis:  Metastatic cancer to brain of unknown cell type ([place]) [C79.31] Community acquired pneumonia of left lung, unspecified part of lung [J18.9] Brain metastasis ([place]) [C79.31] Patient Active Problem List   Diagnosis Date Noted  . Palliative care by specialist   . 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 DNR (do not resuscitate)   . Brain metastases ([place]) 06/17/2018  . Brain metastasis ([place]) 06/16/2018  . CAP (community acquired pneumonia) 06/16/2018  . 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 Low grade fever   . Surgical wound dehiscence   . Opacity of lung on imaging study   . Acute blood loss anemia   . Fever   . SIRS (systemic inflammatory response syndrome) (HCC)   . Leukocytosis   . Status post below knee amputation of right lower extremity ([place]) 08/28/2015  . 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[place]) 08/23/2015  . Sepsis ([place]) 08/21/2015  . ARF (acute renal failure) ([place]) 08/21/2015  . Acute encephalopathy 08/21/2015  . Post-operative state   . Respiratory failure ([place])   . Surgery, elective   . Anxiety   . Paranoid schizophrenia ([place])   . Atherosclerosis of extremity with gangrene ([place])   . Hyperglycemia   . Preoperative cardiovascular examination 04/11/2015  . PVD (peripheral vascular disease) ([place]) 04/10/2015  . Absolute anemia   . Diabetic foot infection ([place]) 04/02/2015  . Right foot pain 04/02/2015  . Neutrophilic leukocytosis 96/29/5284  . Esophageal abnormality   . HCAP (healthcare-associated pneumonia)   . COPD exacerbation ([place])   . Dyspnea   . Respiratory distress   . Sepsis due to  pneumonia ([place]) 02/14/2015  . Acute respiratory failure with hypoxia ([place]) 02/13/2015  . Diabetes mellitus with neurologic complication, without long-term current use of insulin ([place]) 07/20/2011  . Essential hypertension   . COPD (chronic obstructive pulmonary disease) ([place])    PCP:  Megan Mans, NP Pharmacy:  No Pharmacies Listed    Social Determinants of Health (SDOH) Interventions    Readmission Risk Interventions No flowsheet data found.

## 2018-06-19 NOTE — Social Work (Addendum)
1:25pm- CSW managed to get through to [employer] [facility]). Spoke with care coordinator. He is able to return with hospice services once that referral has been made, call placed to pt sister Kenney Houseman at [phone]. Person who answered phone states Kenney Houseman is not available but will call CSW back.   12:10pm- Message left for supervisor on duty also regarding disposition.   11:54am- Message left for [employer] resident Mudlogger.  Aware pt will need hospice services and 24 hour support.  Westley Hummer, MSW, [place] Work [phone]

## 2018-06-20 LAB — GLUCOSE, CAPILLARY
Glucose-Capillary: 143 mg/dL — ABNORMAL HIGH (ref 70–99)
Glucose-Capillary: 174 mg/dL — ABNORMAL HIGH (ref 70–99)
Glucose-Capillary: 182 mg/dL — ABNORMAL HIGH (ref 70–99)
Glucose-Capillary: 274 mg/dL — ABNORMAL HIGH (ref 70–99)

## 2018-06-20 MED ORDER — DEXAMETHASONE 4 MG PO TABS: 4.0000 mg | ORAL_TABLET | Freq: Two times a day (BID) | ORAL | 0 refills | Status: AC

## 2018-06-20 MED ORDER — DEXAMETHASONE 4 MG PO TABS: 4.0000 mg | ORAL_TABLET | Freq: Two times a day (BID) | ORAL | Status: DC

## 2018-06-20 MED ORDER — CLONAZEPAM 1 MG PO TABS: 1.0000 mg | ORAL_TABLET | Freq: Three times a day (TID) | ORAL | 0 refills | Status: AC

## 2018-06-20 MED ORDER — DEXAMETHASONE 4 MG PO TABS
4.0000 mg | ORAL_TABLET | Freq: Two times a day (BID) | ORAL | Status: DC
  Administered 2018-06-20: 4 mg via ORAL
  Filled 2018-06-20: qty 1

## 2018-06-20 MED ORDER — CEPHALEXIN 500 MG PO CAPS
500.0000 mg | ORAL_CAPSULE | Freq: Two times a day (BID) | ORAL | Status: DC
  Administered 2018-06-20: 500 mg via ORAL
  Filled 2018-06-20: qty 1

## 2018-06-20 NOTE — Progress Notes (Signed)
Patient is discharged from room 4NP12 at this time. Alert and in stable condition. IV site d/c'd as well as tele. Report attempted to receiving faciilty and nurse was not available to take report at this time. Writer spoke with "Saralyn Pilar" at the facility. Saralyn Pilar stated receiving nurse will give a call when she gets back. Patient transported out of facility on stretcher by [facility] with all belongings at side.

## 2018-06-20 NOTE — Social Work (Signed)
Clinical Social Worker facilitated patient discharge including contacting patient family and facility to confirm patient discharge plans.  Clinical information faxed to facility and family agreeable with plan.  CSW arranged ambulance transport via [place] to [facility] RN to call [phone] and select option for RN station or press 0 and request to be transferred to RN station  with report prior to discharge.  Clinical Social Worker will sign off for now as social work intervention is no longer needed. Please consult [place] again if new need arises.  Westley Hummer, MSW, [place] Social Worker [phone]

## 2018-06-20 NOTE — TOC Progression Note (Signed)
Transition of Care [facility]) - Progression Note    Patient Details  Name: Craig Bentley MRN: [id number] Date of Birth: 1955-03-26  Transition of Care [facility]) CM/SW [place], [place] Phone Number: 06/20/2018, 11:56 AM  Clinical Narrative:    Paged MD that DNR is on chart to be signed, MD also aware pt has bed at hospice and that it needs to state "hospice services to follow pt." [facility] reviewed and has approved pt for services to follow him at [facility].    Expected Discharge Plan: [place] [facility](with hospice) Barriers to Discharge: Continued Medical Work up(Hospice Referral)  Expected Discharge Plan and Services Expected Discharge Plan: [facility](with hospice) In-house Referral: Clinical Social Work Discharge Planning Services: Other - See comment(Hospice Care) Post Acute Care Choice: Nursing Home, Hospice Living arrangements for the past 2 months: Assisted Living Facility                   Social Determinants of Health (SDOH) Interventions    Readmission Risk Interventions No flowsheet data found.

## 2018-06-20 NOTE — Progress Notes (Signed)
[facility]) Hospice  Hospice eligibility confirmed by [facility] physician. Spoke with sister Kenney Houseman by phone to confirm plan for hospice at [facility]. Tonya deferred DME needs and physician coverage to [facility].   Spoke with Surveyor, minerals at [facility]. She requests an MD order included in the Discharge Summary for Hospice Services to follow upon return. Made CSW Alianza aware.   Per Wells Guiles, DME needs will be assessed after Mr. Kiener arrives.   Please send signed and completed DNR form with patient.   Patient will need prescriptions for discharge comfort medications.  [facility] Referral Center aware of the above.  Completed discharge summary will need to be faxed to [facility] at [phone] when final.  Please notify [facility] when patient is ready to leave the unit at discharge. (Call [phone] between 8:30 and 5pm. Call [phone] after 5pm.)   Please call with hospice related questions.  Thank you for this referral. Heloise Purpura [place] [phone]   [place] are listed on [facility] under Hospice and [place].

## 2018-06-20 NOTE — Progress Notes (Signed)
COURTESY NOTE:   63 y.o. [place] smoker with a 6.9 cm LLL mass c/w a lung primary and two separate brain masses c/w metastases; nonspecific bilateral adrenal masses; no liver or bone lesions  (1) patient declared not competent re decision making by psychiatry 06/18/2018  (2) family has opted for supportive/comfort care under cover of Hospice (see Palliative Care note 06/19/2018)  (3) DNR in place  I am in agreement with overall plan.Will sign off at this point. Please reconsult as needed

## 2018-06-20 NOTE — Progress Notes (Signed)
I checked on Craig Bentley this morning.  He is eating well.  He coughs some as he eats.  He is smiling and looking forward to therapy today.  We discussed leaving the hospital and hospice services.  He is appreciative of the care he is receiving.  Goal is comfort and dignity  Palliative will check intermittently for symptom management   Florentina Jenny, PA-C Palliative Medicine Pager: [phone]   No charge note

## 2018-06-20 NOTE — Discharge Summary (Addendum)
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(with hospice)  Recommendations for Outpatient Follow-up:  1. Please follow up with hospice services provided by [facility]   Diet recommendation: comfort feeds  Activity: The patient is advised to gradually reintroduce usual activities.  Discharge Condition: good  Code Status: DNR DNI comfort feeds   History of present illness: As per the H and P dictated on admission, "Craig Bentley is a 63 y.o. male with history of schizophrenia, dementia, COPD, diabetes mellitus, peripheral vascular disease, hypertension was brought to the ER from group home after patient was found to be weak and not himself for last 24 hours.  Per the report obtained by the ER physician patient had a fall also.  No complaints of any chest pain shortness of breath nausea vomiting or diarrhea.  ED Course: In the ER patient appeared nonfocal.  Mildly confused.  At times hallucinating.  MRI of the brain shows 2 lesions concerning for metastatic lesions in the brain.  On-call neurosurgeon was consulted and admitted for further management of further metastatic work-up and weakness.  Lab works show creatinine of 0.6 glucose 151 AST 18 ALT 14 hemoglobin is 9.4 platelets 409.  Chest x-ray was showing infiltrates concerning for pneumonia for which patient was started on empiric antibiotics and COVID-19 was ordered which was  negative."  Hospital Course:  Summary of his active problems in the hospital is as following. 1. Metastatic brain lesions  6.8 cm Mass in Medial L Lower Lobe concerning for primary lung cancer:  1. Appreciate oncology consult, see 4/25 note.  Likely metastatic lung cancer. 2. Appreciate psych consult, pt lacks capacity to make decisions regarding his likely malignancy.  The patient says that he wants to go home.  He does not want a biopsy or treatment for his cancer.  He says he wants to die at home. 3. Given lack of capacity, discussed with pt family Chrissie Noa and [name]) with palliative care today.  Had discussion with palliative care and family regarding likely diagnosis, prognosis, brother's lack of decision making capacity.  Family decided on palliative approach in light of what [name] has been saying regarding treatment.  Also thought DNR would be consistent with wishes as well.   4. Neurosurgery recommended metastatic workup and diagnosis via bx of potential primary - recommended med/rad onc c/s 5. Will need to be discharged on oral decadron.  6. Appreciate Dr. Virgie Dad assistance.  2. ?Capacity:  Pt lacks capacity to make decisions regarding above due per psych c/s on 4/26. 1. Appreciate psychiatry assistance, pt lacks capacity  2. work with palliative care and his sister to make a plan going forward.  3. Klebsiella UTI: transition to keflex.  4. Type 2 Diabetes mellitus:  SSI.  Lantus started today.Note that patient is on Decadron.  5. Paranoid schizophrenia on Haldol Clozaril and trihexyphenidyl. 6. Anxiety on Klonopin. 7. Dementia on Aricept. 8. Hypertension on lisinopril and metoprolol. 9.  Anemia appears to be chronic follow CBC. 10. Possible pneumonia for which patient is on empiric antibiotics. COVID-19 was negative. CT chest without pneumonia.  Will stop abx for pna. 11. COPD-not actively wheezing. 12. Hyperlipidemia on statins. 13. History of peripheral vascular  disease.  On the day of the discharge the patient's symptoms were controlled, and no other acute medical condition were reported by patient. the patient was felt safe to be discharge at SNF with hospice.  Consultants: psychiatry  Oncology  Palliative care  Procedures: none  DISCHARGE MEDICATION: Allergies as of 06/20/2018   No Known Allergies     Medication List    STOP taking these medications   acetaminophen 325 MG tablet Commonly known as:  TYLENOL   aspirin EC 81 MG tablet   CertaVite Senior Tabs   Lantus SoloStar 100 UNIT/ML Solostar Pen Generic drug:  Insulin Glargine   metFORMIN 1000 MG tablet Commonly known as:  GLUCOPHAGE   simvastatin 5 MG tablet Commonly known as:  ZOCOR     TAKE these medications   clonazePAM 1 MG tablet Commonly known as:  KLONOPIN Take 1 tablet (1 mg total) by mouth 3 (three) times daily.   cloZAPine 100 MG tablet Commonly known as:  CLOZARIL Take 1-4 tablets (100-400 mg total) by mouth 3 (three) times daily. Take 1 tablet (100 mg) by mouth daily at 8am and 4pm, take 4 tablets (400 mg) at bedtime What changed:    how much to take  when to take this  additional instructions   dexamethasone 4 MG tablet Commonly known as:  DECADRON Take 1 tablet (4 mg total) by mouth 2 (two) times daily.   donepezil 10 MG tablet Commonly known as:  ARICEPT Take 10 mg by mouth at bedtime.   Enulose 10 GM/15ML Soln Generic drug:  lactulose (encephalopathy) Take 20 g by mouth 2 (two) times daily.   gabapentin 300 MG capsule Commonly known as:  NEURONTIN Take 300 mg by mouth 3 (three) times daily.   haloperidol 5 MG tablet Commonly known as:  HALDOL Take 1 tablet (5 mg total) by mouth at bedtime. What changed:  when to take this   lisinopril 5 MG tablet Commonly known as:  ZESTRIL Take 5 mg by mouth daily.   metoprolol tartrate 25 MG tablet Commonly known as:  LOPRESSOR Take 25 mg by mouth 2 (two) times daily.   pantoprazole 40 MG  tablet Commonly known as:  Protonix Take 1 tablet (40 mg total) by mouth daily.   senna-docusate 8.6-50 MG tablet Commonly known as:  Senokot-S Take 2 tablets by mouth at bedtime.   trihexyphenidyl 5 MG tablet Commonly known as:  ARTANE Take 5 mg by mouth 2 (two) times daily.      No Known Allergies Discharge Instructions    Diet - low sodium heart healthy   Complete by:  As directed    Increase activity slowly   Complete by:  As directed      Discharge Exam: Filed Weights   06/18/18 0458 06/19/18 0429 06/20/18 0436  Weight: 77.1 kg 77.2 kg 80 kg   Vitals:   06/20/18 0731 06/20/18 1135  BP: (!) 173/71 (!) 178/84  Pulse: 80 87  Resp: (!) 22 20  Temp: 98.6 F (37 C) 97.8 F (36.6 C)  SpO2: 94% 95%   General: Appear in mild distress, no Rash; Oral Mucosa moist Cardiovascular: S1 and S2 Present, no Murmur, no JVD Respiratory: Bilateral Air entry present and Clear to Auscultation,  no Crackles, no wheezes Abdomen: Bowel Sound present, Soft and no tenderness Extremities: no Pedal edema, no calf tenderness Neurology: Grossly no focal neuro deficit.  The results of significant diagnostics from this hospitalization (including imaging, microbiology, ancillary and laboratory) are listed below for reference.    Significant Diagnostic Studies: Ct Head Wo Contrast  Result Date: 06/16/2018 CLINICAL DATA:  63 year old with encephalopathy. Altered mental status. EXAM: CT HEAD WITHOUT CONTRAST TECHNIQUE: Contiguous axial images were obtained from the base of the skull through the vertex without intravenous contrast. COMPARISON:  01/27/2018 FINDINGS: Brain: Extensive vasogenic edema in the left temporal lobe, left occipital and left parietal lobes. There is some sulci effacement in the left cerebrum particularly along the posterior aspect. No evidence for acute hemorrhage or midline shift. Negative for hydronephrosis. Mass lesion is not confidently identified but could be associated with  the vasogenic edema. Mild effacement of the left frontal horn. Vascular: No hyperdense vessel or unexpected calcification. Skull: Normal. Negative for fracture or focal lesion. Sinuses/Orbits: No acute finding. Other: None. IMPRESSION: 1. Vasogenic edema involving the left temporal lobe, left occipital lobe and left parietal lobe. Findings raise concern for an underlying lesion such as infection or neoplasm. Recommend further characterization with an MRI, with and without contrast. 2. Mild sulci effacement in the left cerebral hemisphere without significant mass effect. 3. Negative for acute hemorrhage.  No significant midline shift. These results were called by telephone at the time of interpretation on 06/16/2018 at 4:07 pm to [facility] , who verbally acknowledged these results. Electronically Signed   By: Markus Daft M.D.   On: 06/16/2018 16:09   Ct Chest W Contrast  Result Date: 06/16/2018 CLINICAL DATA:  Neoplasm suspected EXAM: CT CHEST, ABDOMEN, AND PELVIS WITH CONTRAST TECHNIQUE: Multidetector CT imaging of the chest, abdomen and pelvis was performed following the standard protocol during bolus administration of intravenous contrast. CONTRAST:  134mL OMNIPAQUE IOHEXOL 300 MG/ML  SOLN COMPARISON:  CT abdomen and pelvis 09/25/2012 FINDINGS: CT CHEST FINDINGS Cardiovascular: Heart is normal size. Moderate scattered coronary artery calcifications and aortic calcifications. No evidence of aortic aneurysm. Mediastinum/Nodes: Mildly prominent subcarinal lymph node with a short axis diameter of 11 mm. No hilar or axillary adenopathy. Lungs/Pleura: Rounded masslike airspace opacity noted in the left lower lobe measuring up to 6.8 cm concerning for malignancy. Small left pleural effusion. Right lung clear. Scarring in the lingula. Scattered air-filled cystic areas in the lungs bilaterally. Musculoskeletal: Chest wall soft tissues are unremarkable. No acute bony abnormality. CT ABDOMEN PELVIS FINDINGS  Hepatobiliary: No focal hepatic abnormality. Gallbladder unremarkable. Pancreas: No focal abnormality or ductal dilatation. Spleen: No focal abnormality.  Normal size. Adrenals/Urinary Tract: Bilateral adrenal nodules, 2.4 cm on the left and 11 mm on the right. No renal mass or hydronephrosis. Urinary bladder unremarkable. Stomach/Bowel: Moderate stool burden within the colon. Normal appendix. Stomach, large and small bowel grossly unremarkable. Vascular/Lymphatic: Aortic atherosclerosis. Patient is status post aortobifemoral bypass. The right iliofemoral limb is occluded. No adenopathy. Reproductive: No visible focal abnormality. Other: No free fluid or free air. Musculoskeletal: No acute bony abnormality. IMPRESSION: 6.8 cm mass in the medial left lower lobe concerning for primary lung cancer. This could be further evaluated with PET CT anti she would diagnosis. Small left effusion. Coronary artery disease, aortic atherosclerosis. Small bilateral adrenal nodules, nonspecific. Cannot exclude metastases. Prior aortobifemoral bypass. The right iliofemoral limb is occluded. Electronically Signed   By: Rolm Baptise M.D.   On: 06/16/2018 23:02   Mr Jeri Cos  And Wo Contrast  Result Date: 06/16/2018 CLINICAL DATA:  Encephalopathy with altered mental status. EXAM: MRI HEAD WITHOUT AND WITH CONTRAST TECHNIQUE: Multiplanar, multiecho pulse sequences of the brain and surrounding structures were obtained without and with intravenous contrast. CONTRAST:  Gadavist 7 mL. COMPARISON:  CT head earlier today. FINDINGS: Brain: There is a large intra-axial posterior LEFT temporal mass, 23 x 27 x 19 mm. Extensive vasogenic edema. No uncal herniation. No midline shift. The lesion is T2 hypointense, T1 hyperintense, marked susceptibility on gradient sequence, and avid postcontrast enhancement. Some areas of central heterogeneity could represent necrosis. A second lesion, 5 mm, is identified, LEFT inferior frontal lobe near the gyrus  rectus. Lesion is too small to accurately characterize except as that of a probable metastasis. Generalized atrophy.  Minor white matter disease. Vascular: Normal flow voids. Skull and upper cervical spine: Normal marrow signal. Sinuses/Orbits: Negative. Other: None. IMPRESSION: Two separate enhancing lesions in the brain, with the dominant lesion in the LEFT posterior temporal lobe, and a subcentimeter lesion in the LEFT frontal lobe, are strongly suggestive of intracranial metastatic disease, unknown primary. Moderate vasogenic edema, but no uncal herniation or midline shift. Imaging characteristics of the larger lesion, with T1 shortening, T2 shortening, and susceptibility on gradient sequence suggest either a metastatic hemorrhagic neoplasm or metastatic melanoma. Electronically Signed   By: Staci Righter M.D.   On: 06/16/2018 18:47   Ct Abdomen Pelvis W Contrast  Result Date: 06/16/2018 CLINICAL DATA:  Neoplasm suspected EXAM: CT CHEST, ABDOMEN, AND PELVIS WITH CONTRAST TECHNIQUE: Multidetector CT imaging of the chest, abdomen and pelvis was performed following the standard protocol during bolus administration of intravenous contrast. CONTRAST:  127mL OMNIPAQUE IOHEXOL 300 MG/ML  SOLN COMPARISON:  CT abdomen and pelvis 09/25/2012 FINDINGS: CT CHEST FINDINGS Cardiovascular: Heart is normal size. Moderate scattered coronary artery calcifications and aortic calcifications. No evidence of aortic aneurysm. Mediastinum/Nodes: Mildly prominent subcarinal lymph node with a short axis diameter of 11 mm. No hilar or axillary adenopathy. Lungs/Pleura: Rounded masslike airspace opacity noted in the left lower lobe measuring up to 6.8 cm concerning for malignancy. Small left pleural effusion. Right lung clear. Scarring in the lingula. Scattered air-filled cystic areas in the lungs bilaterally. Musculoskeletal: Chest wall soft tissues are unremarkable. No acute bony abnormality. CT ABDOMEN PELVIS FINDINGS Hepatobiliary:  No focal hepatic abnormality. Gallbladder unremarkable. Pancreas: No focal abnormality or ductal dilatation. Spleen: No focal abnormality.  Normal size. Adrenals/Urinary Tract: Bilateral adrenal nodules, 2.4 cm on the left and 11 mm on the right. No renal mass or hydronephrosis. Urinary bladder unremarkable. Stomach/Bowel: Moderate stool burden within the colon. Normal appendix. Stomach, large and small bowel grossly unremarkable. Vascular/Lymphatic: Aortic atherosclerosis. Patient is status post aortobifemoral bypass. The right iliofemoral limb is occluded. No adenopathy. Reproductive: No visible focal abnormality. Other: No free fluid or free air. Musculoskeletal: No acute bony abnormality. IMPRESSION: 6.8 cm mass in the medial left lower lobe concerning for primary lung cancer. This could be further evaluated with PET CT anti she would diagnosis. Small left effusion. Coronary artery disease, aortic atherosclerosis. Small bilateral adrenal nodules, nonspecific. Cannot exclude metastases. Prior aortobifemoral bypass. The right iliofemoral limb is occluded. Electronically Signed   By: Rolm Baptise M.D.   On: 06/16/2018 23:02   Dg Chest Portable 1 View  Result Date: 06/16/2018 CLINICAL DATA:  Altered mental status. EXAM: PORTABLE CHEST 1 VIEW COMPARISON:  August 09, 2017 FINDINGS: There is consolidation in a portion of the medial left base. There is a minimal  left pleural effusion. Lungs elsewhere are clear. Heart size and pulmonary vascularity are normal. No adenopathy. There is aortic atherosclerosis. No bone lesions. IMPRESSION: Medial left base consolidation, likely pneumonia. Small left pleural effusion. Lungs elsewhere clear. Stable cardiac silhouette. Aortic Atherosclerosis (ICD10-I70.0). Followup PA and lateral chest radiographs recommended in 3-4 weeks following trial of antibiotic therapy to ensure resolution and exclude underlying malignancy. Electronically Signed   By: Lowella Grip III M.D.   On:  06/16/2018 13:25    Microbiology: Recent Results (from the past 240 hour(s))  Urine culture     Status: Abnormal   Collection Time: 06/16/18  5:11 PM  Result Value Ref Range Status   Specimen Description URINE, RANDOM  Final   Special Requests   Final    NONE Performed at [facility], [address], [place], [place] [address]    Culture >=100,000 COLONIES/mL KLEBSIELLA PNEUMONIAE (A)  Final   Report Status 06/18/2018 FINAL  Final   Organism ID, Bacteria KLEBSIELLA PNEUMONIAE (A)  Final      Susceptibility   Klebsiella pneumoniae - MIC*    AMPICILLIN RESISTANT Resistant     CEFAZOLIN <=4 SENSITIVE Sensitive     CEFTRIAXONE <=1 SENSITIVE Sensitive     CIPROFLOXACIN <=0.25 SENSITIVE Sensitive     GENTAMICIN <=1 SENSITIVE Sensitive     IMIPENEM <=0.25 SENSITIVE Sensitive     NITROFURANTOIN <=16 SENSITIVE Sensitive     TRIMETH/SULFA <=20 SENSITIVE Sensitive     AMPICILLIN/SULBACTAM <=2 SENSITIVE Sensitive     PIP/TAZO <=4 SENSITIVE Sensitive     Extended ESBL NEGATIVE Sensitive     * >=100,000 COLONIES/mL KLEBSIELLA PNEUMONIAE  SARS Coronavirus 2 [facility] order, Performed in [place] hospital lab)     Status: None   Collection Time: 06/16/18  7:57 PM  Result Value Ref Range Status   SARS Coronavirus 2 NEGATIVE NEGATIVE Final    Comment: (NOTE) If result is NEGATIVE SARS-CoV-2 target nucleic acids are NOT DETECTED. The SARS-CoV-2 RNA is generally detectable in upper and lower  respiratory specimens during the acute phase of infection. The lowest  concentration of SARS-CoV-2 viral copies this assay can detect is 250  copies / mL. A negative result does not preclude SARS-CoV-2 infection  and should not be used as the sole basis for treatment or other  patient management decisions.  A negative result may occur with  improper specimen collection / handling, submission of specimen other  than nasopharyngeal swab, presence of viral mutation(s) within the  areas targeted  by this assay, and inadequate number of viral copies  (<250 copies / mL). A negative result must be combined with clinical  observations, patient history, and epidemiological information. If result is POSITIVE SARS-CoV-2 target nucleic acids are DETECTED. The SARS-CoV-2 RNA is generally detectable in upper and lower  respiratory specimens dur ing the acute phase of infection.  Positive  results are indicative of active infection with SARS-CoV-2.  Clinical  correlation with patient history and other diagnostic information is  necessary to determine patient infection status.  Positive results do  not rule out bacterial infection or co-infection with other viruses. If result is PRESUMPTIVE POSTIVE SARS-CoV-2 nucleic acids MAY BE PRESENT.   A presumptive positive result was obtained on the submitted specimen  and confirmed on repeat testing.  While 2019 novel coronavirus  (SARS-CoV-2) nucleic acids may be present in the submitted sample  additional confirmatory testing may be necessary for epidemiological  and / or clinical management purposes  to  differentiate between  SARS-CoV-2 and other Sarbecovirus currently known to infect humans.  If clinically indicated additional testing with an alternate test  methodology [phone]) is advised. The SARS-CoV-2 RNA is generally  detectable in upper and lower respiratory sp ecimens during the acute  phase of infection. The expected result is Negative. Fact Sheet for Patients:  StrictlyIdeas.no Fact Sheet for Healthcare Providers: BankingDealers.co.za This test is not yet approved or cleared by the [place] FDA and has been authorized for detection and/or diagnosis of SARS-CoV-2 by FDA under an Emergency Use Authorization (EUA).  This EUA will remain in effect (meaning this test can be used) for the duration of the COVID-19 declaration under Section 564(b)(1) of the Act, 21 U.S.C. section  360bbb-3(b)(1), unless the authorization is terminated or revoked sooner. Performed at [facility], [place] [address] [place], [place] [address]   Respiratory Panel by PCR     Status: None   Collection Time: 06/16/18  7:57 PM  Result Value Ref Range Status   Adenovirus NOT DETECTED NOT DETECTED Final   Coronavirus 229E NOT DETECTED NOT DETECTED Final    Comment: (NOTE) The Coronavirus on the Respiratory Panel, DOES NOT test for the novel  Coronavirus (2019 nCoV)    Coronavirus HKU1 NOT DETECTED NOT DETECTED Final   Coronavirus NL63 NOT DETECTED NOT DETECTED Final   Coronavirus OC43 NOT DETECTED NOT DETECTED Final   Metapneumovirus NOT DETECTED NOT DETECTED Final   Rhinovirus / Enterovirus NOT DETECTED NOT DETECTED Final   Influenza A NOT DETECTED NOT DETECTED Final   Influenza B NOT DETECTED NOT DETECTED Final   Parainfluenza Virus 1 NOT DETECTED NOT DETECTED Final   Parainfluenza Virus 2 NOT DETECTED NOT DETECTED Final   Parainfluenza Virus 3 NOT DETECTED NOT DETECTED Final   Parainfluenza Virus 4 NOT DETECTED NOT DETECTED Final   Respiratory Syncytial Virus NOT DETECTED NOT DETECTED Final   Bordetella pertussis NOT DETECTED NOT DETECTED Final   Chlamydophila pneumoniae NOT DETECTED NOT DETECTED Final   Mycoplasma pneumoniae NOT DETECTED NOT DETECTED Final    Comment: Performed at [facility], [place]. [address] [place], [place] [address]  MRSA PCR Screening     Status: None   Collection Time: 06/16/18 11:00 PM  Result Value Ref Range Status   MRSA by PCR NEGATIVE NEGATIVE Final    Comment:        The GeneXpert MRSA Assay (FDA approved for NASAL specimens only), is one component of a comprehensive MRSA colonization surveillance program. It is not intended to diagnose MRSA infection nor to guide or monitor treatment for MRSA infections. Performed at [facility], [place] [address] [place], [place] [address]   Culture, blood (routine x 2) Call MD if unable to  obtain prior to antibiotics being given     Status: None (Preliminary result)   Collection Time: 06/16/18 11:02 PM  Result Value Ref Range Status   Specimen Description BLOOD RIGHT ANTECUBITAL  Final   Special Requests   Final    BOTTLES DRAWN AEROBIC ONLY Blood Culture results may not be optimal due to an inadequate volume of blood received in culture bottles   Culture   Final    NO GROWTH 4 DAYS Performed at [facility] [address], [place], [place] [address]    Report Status PENDING  Incomplete  Culture, blood (routine x 2) Call MD if unable to obtain prior to antibiotics being given     Status: None (  Preliminary result)   Collection Time: 06/16/18 11:03 PM  Result Value Ref Range Status   Specimen Description BLOOD RIGHT ANTECUBITAL  Final   Special Requests   Final    BOTTLES DRAWN AEROBIC ONLY Blood Culture results may not be optimal due to an inadequate volume of blood received in culture bottles   Culture   Final    NO GROWTH 4 DAYS Performed at [facility] [address], [place], [place] [address]    Report Status PENDING  Incomplete     Labs: CBC: Recent Labs  Lab 06/16/18 1324 06/17/18 0440 06/19/18 0412  WBC 10.2 10.2 18.1*  NEUTROABS 7.4 7.3  --   HGB 9.4* 9.1* 9.5*  HCT 32.4* 31.2* 32.9*  MCV 81.8 80.2 81.0  PLT 409* 381 479*   Basic Metabolic Panel: Recent Labs  Lab 06/16/18 1324 06/17/18 0440 06/19/18 0412  NA 140 141 143  K 3.9 3.6 3.4*  CL 99 102 108  CO2 26 29 25   GLUCOSE 151* 116* 263*  BUN 10 12 15   CREATININE 0.61 0.72 0.66  CALCIUM 9.4 9.3 9.4  MG  --   --  1.7   Liver Function Tests: Recent Labs  Lab 06/16/18 1324 06/17/18 0440 06/19/18 0412  AST 18 13* 14*  ALT 14 12 14   ALKPHOS 59 58 62  BILITOT 0.4 0.5 0.1*  PROT 6.9 6.4* 6.7  ALBUMIN 3.0* 2.7* 2.8*   No results for input(s): LIPASE, AMYLASE in the last 168 hours. No results for input(s): AMMONIA in the last 168 hours. Cardiac Enzymes: No results for  input(s): CKTOTAL, CKMB, CKMBINDEX, TROPONINI in the last 168 hours. BNP (last 3 results) No results for input(s): BNP in the last 8760 hours. CBG: Recent Labs  Lab 06/19/18 2035 06/20/18 0036 06/20/18 0434 06/20/18 0723 06/20/18 1132  GLUCAP 296* 274* 143* 174* 182*   Time spent: 35 minutes  Signed:  Berle Mull  [facility]  06/20/2018

## 2018-06-20 NOTE — TOC Transition Note (Signed)
Transition of Care [facility]) - CM/SW Discharge Note   Patient Details  Name: Craig Bentley MRN: [id number] Date of Birth: [birth year]  Transition [facility]) CM/SW Contact:  Alexander Mt, [place] Phone Number: 06/20/2018, 12:38 PM   Clinical Narrative:    Pt cleared to return to [facility].  DNR and script for klonopin need to be signed, MD aware. Hospice services arranged through [facility]. D/c summary sent to [facility] and Manufacturing engineer.  Tonya aware and Harmon Pier, hospice liaison has reached out to ALF regarding f/u needs.   Final next level of care: Assisted Living(with hospice) Barriers to Discharge: Barriers Resolved   Patient Goals and CMS Choice Patient states their goals for this hospitalization and ongoing recovery are:: for him to be comfortable- pt sister CMS Medicare.gov Compare Post Acute Care list provided to:: Patient Represenative (must comment) Choice offered to / list presented to : Sibling  Discharge Placement              Patient chooses bed at: [facility] Patient to be transferred to facility by: [place] Name of family member notified: pt sister Patient and family notified of of transfer: 06/20/18  Discharge Plan and Services In-house Referral: Clinical Social Work Discharge Planning Services: Other - See comment(Hospice Care) Post Acute Care Choice: Nursing Home, Hospice          DME Arranged: N/A           HH Agency: [facility] [place] Date [facility] Agency Contacted: 06/19/18 Time HH Agency Contacted: 1600 Representative spoke with at [place]: Venia Carbon and Erling Conte  Social Determinants of Health ([place]) Interventions     Readmission Risk Interventions No flowsheet data found.

## 2018-06-21 LAB — CULTURE, BLOOD (ROUTINE X 2)
Culture: NO GROWTH
Culture: NO GROWTH

## 2018-07-24 ENCOUNTER — Emergency Department (HOSPITAL_COMMUNITY)
Admission: EM | Admit: 2018-07-24 | Discharge: 2018-07-25 | Disposition: A | Discharge status: Home / Self Care | Payer: Medicaid Other | Attending: Emergency Medicine | Admitting: Emergency Medicine

## 2018-07-24 ENCOUNTER — Encounter (HOSPITAL_COMMUNITY): Payer: Self-pay

## 2018-07-24 ENCOUNTER — Other Ambulatory Visit: Payer: Self-pay

## 2018-07-24 DIAGNOSIS — C7931 Secondary malignant neoplasm of brain: Secondary | ICD-10-CM | POA: Insufficient documentation

## 2018-07-24 DIAGNOSIS — N3000 Acute cystitis without hematuria: Secondary | ICD-10-CM

## 2018-07-24 DIAGNOSIS — M545 Low back pain, unspecified: Secondary | ICD-10-CM

## 2018-07-24 DIAGNOSIS — I10 Essential (primary) hypertension: Secondary | ICD-10-CM | POA: Insufficient documentation

## 2018-07-24 DIAGNOSIS — F17210 Nicotine dependence, cigarettes, uncomplicated: Secondary | ICD-10-CM | POA: Insufficient documentation

## 2018-07-24 DIAGNOSIS — G8929 Other chronic pain: Secondary | ICD-10-CM | POA: Diagnosis not present

## 2018-07-24 DIAGNOSIS — J449 Chronic obstructive pulmonary disease, unspecified: Secondary | ICD-10-CM | POA: Insufficient documentation

## 2018-07-24 DIAGNOSIS — E119 Type 2 diabetes mellitus without complications: Secondary | ICD-10-CM | POA: Insufficient documentation

## 2018-07-24 DIAGNOSIS — Z79899 Other long term (current) drug therapy: Secondary | ICD-10-CM | POA: Insufficient documentation

## 2018-07-24 MED ORDER — FENTANYL CITRATE (PF) 100 MCG/2ML IJ SOLN
25.0000 ug | Freq: Once | INTRAMUSCULAR | Status: AC
  Administered 2018-07-25: 25 ug via INTRAVENOUS
  Filled 2018-07-24: qty 2

## 2018-07-24 NOTE — ED Provider Notes (Signed)
[place] DEPT Provider Note   CSN: [id number] Arrival date & time: 07/24/18  2157    History   Chief Complaint Chief Complaint  Patient presents with  . Back Pain    HPI Craig Bentley is a 63 y.o. male.     HPI  This is a 63 year old male with a history of COPD, diabetes, metastatic brain cancer, schizophrenia and schizoaffective disorder who presents with reported back pain.  Patient presents from [facility].  Per EMS report, complained of ongoing back and bilateral hip pain.  Patient has a history of the same.  He reportedly does not take anything at his facility for this.  Patient is very difficult to understand.  He is dysarthric at baseline.  When asked if he has pain he does endorse pain in the back.  No reported fevers.  Denies chest pain, shortness of breath, abdominal pain, nausea, vomiting.  Patient with recent admission to the hospital.  Palliative care consulted.  He is DNR per chart review.  Past Medical History:  Diagnosis Date  . Anxiety   . Constipation   . COPD (chronic obstructive pulmonary disease) ([place])   . Diabetes mellitus   . Fecal impaction ([place])   . Hypertension   . Pneumonia   . Schizophrenia, schizo-affective [facility])     Patient Active Problem List   Diagnosis Date Noted  . Palliative care by specialist   . Metastatic cancer to brain of unknown cell type ([place])   . DNR (do not resuscitate)   . Brain metastases ([place]) 06/17/2018  . Brain metastasis ([employer]) 06/16/2018  . CAP (community acquired pneumonia) 06/16/2018  . 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 Low grade fever   . Surgical wound dehiscence   . Opacity of lung on imaging study   . Acute blood loss anemia   . Fever   . SIRS (systemic inflammatory response syndrome) (HCC)   . Leukocytosis   . Status post below knee amputation of right lower extremity ([place]) 08/28/2015  . Diabetic osteomyelitis  ([place]) 08/23/2015  . Sepsis ([place]) 08/21/2015  . ARF (acute renal failure) ([place]) 08/21/2015  . Acute encephalopathy 08/21/2015  . Post-operative state   . Respiratory failure ([place])   . Surgery, elective   . Anxiety   . Paranoid schizophrenia ([place])   . Atherosclerosis of extremity with gangrene ([place])   . Hyperglycemia   . Preoperative cardiovascular examination 04/11/2015  . PVD (peripheral vascular disease) ([place]) 04/10/2015  . Absolute anemia   . Diabetic foot infection ([place]) 04/02/2015  . Right foot pain 04/02/2015  . Neutrophilic leukocytosis 89/38/1017  . Esophageal abnormality   . HCAP (healthcare-associated pneumonia)   . COPD exacerbation ([place])   . Dyspnea   . Respiratory distress   . Sepsis due to pneumonia ([place]) 02/14/2015  . Acute respiratory failure with hypoxia ([place]) 02/13/2015  . Diabetes mellitus with neurologic complication, without long-term current use of insulin ([place]) 07/20/2011  . Essential hypertension   . COPD (chronic obstructive pulmonary disease) ([place])     Past Surgical History:  Procedure Laterality Date  . AMPUTATION Right 04/23/2015   Procedure: Right Fifth Toe Amputation;  Surgeon: Conrad [place], MD;  Location: [place];  Service: Vascular;  Laterality: Right;  . AMPUTATION Right 08/27/2015   Procedure: RIGHT BELOW KNEE AMPUTATION ;  Surgeon: Newt Minion, MD;  Location: [place];  Service: Orthopedics;  Laterality: Right;  . AORTA - BILATERAL FEMORAL  ARTERY BYPASS GRAFT Bilateral 04/23/2015   Procedure: AORTOBIFEMORAL BYPASS GRAFT;  Surgeon: Conrad Morton, MD;  Location: [place];  Service: Vascular;  Laterality: Bilateral;  . FEMORAL-FEMORAL BYPASS GRAFT Left 04/23/2015   Procedure:  Left Common Femoral Artery to Superficial Femoral Artery Bypass Graft with Vein;  Surgeon: Conrad Donald, MD;  Location: [place];  Service: Vascular;  Laterality: Left;  . FEMORAL-TIBIAL BYPASS GRAFT Right 04/23/2015   Procedure: Right Common Femoral to Posterior Tibial Bypass with Composite  Vein Graft. ;  Surgeon: Conrad Segundo, MD;  Location: [place];  Service: Vascular;  Laterality: Right;  . PERIPHERAL VASCULAR CATHETERIZATION N/A 04/07/2015   Procedure: Abdominal Aortogram;  Surgeon: Angelia Mould, MD;  Location: [place] CV [facility];  Service: Cardiovascular;  Laterality: N/A;        Home Medications    Prior to Admission medications   Medication Sig Start Date End Date Taking? Authorizing Provider  cephALEXin (KEFLEX) 500 MG capsule Take 1 capsule (500 mg total) by mouth 3 (three) times daily. 07/25/18   Kayston Jodoin, Barbette Hair, MD  clonazePAM (KLONOPIN) 1 MG tablet Take 1 tablet (1 mg total) by mouth 3 (three) times daily. 06/20/18   Lavina Hamman, MD  cloZAPine (CLOZARIL) 100 MG tablet Take 1-4 tablets (100-400 mg total) by mouth 3 (three) times daily. Take 1 tablet (100 mg) by mouth daily at 8am and 4pm, take 4 tablets (400 mg) at bedtime Patient taking differently: Take 200 mg by mouth 2 (two) times daily.  08/28/15   Rai, Ripudeep K, MD  dexamethasone (DECADRON) 4 MG tablet Take 1 tablet (4 mg total) by mouth 2 (two) times daily. 06/20/18   Lavina Hamman, MD  donepezil (ARICEPT) 10 MG tablet Take 10 mg by mouth at bedtime.    [provider]  gabapentin (NEURONTIN) 300 MG capsule Take 300 mg by mouth 3 (three) times daily.    [provider]  haloperidol (HALDOL) 5 MG tablet Take 1 tablet (5 mg total) by mouth at bedtime. Patient taking differently: Take 5 mg by mouth 2 (two) times daily.  08/28/15   Rai, Ripudeep K, MD  lactulose, encephalopathy, (ENULOSE) 10 GM/15ML SOLN Take 20 g by mouth 2 (two) times daily.    [provider]  lisinopril (PRINIVIL,ZESTRIL) 5 MG tablet Take 5 mg by mouth daily.    [provider]  metoprolol tartrate (LOPRESSOR) 25 MG tablet Take 25 mg by mouth 2 (two) times daily.    [provider]  pantoprazole (PROTONIX) 40 MG tablet Take 1 tablet (40 mg total) by mouth daily. 02/20/15   Theodis Blaze, MD   senna-docusate (SENOKOT-S) 8.6-50 MG tablet Take 2 tablets by mouth at bedtime. 09/09/15   Love, Ivan Anchors, PA-C  trihexyphenidyl (ARTANE) 5 MG tablet Take 5 mg by mouth 2 (two) times daily.     [provider]    Family History Family History  Problem Relation Age of Onset  . Mental illness Mother     Social History Social History   Tobacco Use  . Smoking status: Current Every Day Smoker    Packs/day: 1.50    Years: 50.00    Pack years: 75.00    Types: Cigarettes  . Smokeless tobacco: Never Used  Substance Use Topics  . Alcohol use: No    Alcohol/week: 0.0 standard drinks  . Drug use: No     Allergies   Patient has no known allergies.   Review of Systems  Review of Systems  Unable to perform ROS: Other (Dysarthria and very difficult to understand)     Physical Exam Updated Vital Signs BP 106/66 (BP Location: Left Arm)   Pulse 80   Temp 98.3 F (36.8 C) (Oral)   Resp 19   SpO2 96%   Physical Exam Vitals signs and nursing note reviewed.  Constitutional:      Appearance: He is well-developed.     Comments: Chronically ill-appearing but nontoxic in no acute distress  HENT:     Head: Normocephalic and atraumatic.     Mouth/Throat:     Mouth: Mucous membranes are moist.  Eyes:     Pupils: Pupils are equal, round, and reactive to light.  Neck:     Musculoskeletal: Neck supple.  Cardiovascular:     Rate and Rhythm: Normal rate and regular rhythm.     Heart sounds: Normal heart sounds. No murmur.  Pulmonary:     Effort: Pulmonary effort is normal. No respiratory distress.     Breath sounds: Normal breath sounds. No wheezing.  Abdominal:     General: Bowel sounds are normal.     Palpations: Abdomen is soft.     Tenderness: There is no abdominal tenderness.     Comments: Extensive abdominal scarring noted  Musculoskeletal:     Comments: Right BKA  Lymphadenopathy:     Cervical: No cervical adenopathy.  Skin:    General: Skin is warm and dry.   Neurological:     Mental Status: He is alert.     Comments: Somnolent but arousable, difficult to assess orientation, dysarthria with left-sided facial droop noted, follows simple commands      ED Treatments / Results  Labs (all labs ordered are listed, but only abnormal results are displayed) Labs Reviewed  CBC WITH DIFFERENTIAL/PLATELET - Abnormal; Notable for the following components:      Result Value   WBC 11.7 (*)    RBC 4.07 (*)    Hemoglobin 9.7 (*)    HCT 33.7 (*)    MCH 23.8 (*)    MCHC 28.8 (*)    RDW 17.2 (*)    Neutro Abs 8.2 (*)    Monocytes Absolute 1.2 (*)    All other components within normal limits  BASIC METABOLIC PANEL - Abnormal; Notable for the following components:   Chloride 96 (*)    Glucose, Bld 189 (*)    All other components within normal limits  URINALYSIS, ROUTINE W REFLEX MICROSCOPIC - Abnormal; Notable for the following components:   APPearance HAZY (*)    Hgb urine dipstick SMALL (*)    Leukocytes,Ua SMALL (*)    Bacteria, UA MANY (*)    All other components within normal limits  URINE CULTURE    EKG None  Radiology Dg Lumbar Spine Complete  Result Date: 07/25/2018 CLINICAL DATA:  Back pain EXAM: LUMBAR SPINE - COMPLETE 4+ VIEW COMPARISON:  CT dated 06/16/2018. FINDINGS: There is no acute osseous abnormality. There is an old fracture deformity of the L4 vertebral body. There are multilevel degenerative changes of the lumbar spine, greatest at the lower lumbar segments. There is a large amount of stool throughout the visualized colon. IMPRESSION: 1. No acute osseous abnormality. 2. Large stool burden. Electronically Signed   By: Constance Holster M.D.   On: 07/25/2018 01:14   Dg Pelvis 1-2 Views  Result Date: 07/25/2018 CLINICAL DATA:  Bilateral hip pain EXAM: PELVIS - 1-2 VIEW COMPARISON:  None. FINDINGS: There is no  acute displaced fracture or dislocation. There is a large amount of stool in the colon. Mild degenerative changes are noted  of both hips. IMPRESSION: 1. No acute osseous abnormality. 2. Large stool burden. Electronically Signed   By: Constance Holster M.D.   On: 07/25/2018 01:15    Procedures Procedures (including critical care time)  Medications Ordered in ED Medications  fentaNYL (SUBLIMAZE) injection 25 mcg (25 mcg Intravenous Given 07/25/18 0025)     Initial Impression / Assessment and Plan / ED Course  I have reviewed the triage vital signs and the nursing notes.  Pertinent labs & imaging results that were available during my care of the patient were reviewed by me and considered in my medical decision making (see chart for details).        Patient presents with reported back pain which is chronic in nature.  He has metastatic cancer and slurred speech at baseline.  He is very difficult to obtain a history from.  Overall he is nontoxic-appearing and vital signs are reassuring.  His physical exam is fairly benign.  Given difficulty to obtain history, basic lab work including urinalysis obtained.  Imaging was obtained given his history of metastatic cancer to ensure no new metastatic lesions to the lung or pelvis.  Lab work-up is largely reassuring.  His urinalysis does show white cells and many bacteria.  This was a catheterized specimen.  Urine culture sent.  Will treat.  Patient has remained hemodynamically stable while in the emergency department without any further interventions needed.  Will discharge back to his living facility.  After history, exam, and medical workup I feel the patient has been appropriately medically screened and is safe for discharge home. Pertinent diagnoses were discussed with the patient. Patient was given return precautions.   Final Clinical Impressions(s) / ED Diagnoses   Final diagnoses:  Chronic midline low back pain without sciatica  Acute cystitis without hematuria    ED Discharge Orders         Ordered    cephALEXin (KEFLEX) 500 MG capsule  3 times daily      07/25/18 0126           Merryl Hacker, MD 07/25/18 [phone]

## 2018-07-24 NOTE — ED Notes (Signed)
Pt dressed into gown but refuses to take undershirt off because "he's cold". Pt on monitor and has call bell within reach. Pt given warm blankets.

## 2018-07-24 NOTE — ED Notes (Signed)
Bed: HU76 Expected date:  Expected time:  Means of arrival:  Comments: EMS Low Back/Hip Pain

## 2018-07-24 NOTE — ED Triage Notes (Signed)
Patient coming from [facility] with complaints of chronic lower back pain and bilateral hip pain. Patient told facility that he wanted the facility to call hospice, but is not a hospice patient. Patient has slurred speech at baseline.

## 2018-07-25 ENCOUNTER — Emergency Department (HOSPITAL_COMMUNITY): Payer: Medicaid Other

## 2018-07-25 LAB — URINALYSIS, ROUTINE W REFLEX MICROSCOPIC
Bilirubin Urine: NEGATIVE
Glucose, UA: NEGATIVE mg/dL
Ketones, ur: NEGATIVE mg/dL
Nitrite: NEGATIVE
Protein, ur: NEGATIVE mg/dL
Specific Gravity, Urine: 1.017 (ref 1.005–1.030)
pH: 5 (ref 5.0–8.0)

## 2018-07-25 LAB — CBC WITH DIFFERENTIAL/PLATELET
Abs Immature Granulocytes: 0.06 10*3/uL (ref 0.00–0.07)
Basophils Absolute: 0 10*3/uL (ref 0.0–0.1)
Basophils Relative: 0 %
Eosinophils Absolute: 0 10*3/uL (ref 0.0–0.5)
Eosinophils Relative: 0 %
HCT: 33.7 % — ABNORMAL LOW (ref 39.0–52.0)
Hemoglobin: 9.7 g/dL — ABNORMAL LOW (ref 13.0–17.0)
Immature Granulocytes: 1 %
Lymphocytes Relative: 19 %
Lymphs Abs: 2.2 10*3/uL (ref 0.7–4.0)
MCH: 23.8 pg — ABNORMAL LOW (ref 26.0–34.0)
MCHC: 28.8 g/dL — ABNORMAL LOW (ref 30.0–36.0)
MCV: 82.8 fL (ref 80.0–100.0)
Monocytes Absolute: 1.2 10*3/uL — ABNORMAL HIGH (ref 0.1–1.0)
Monocytes Relative: 10 %
Neutro Abs: 8.2 10*3/uL — ABNORMAL HIGH (ref 1.7–7.7)
Neutrophils Relative %: 70 %
Platelets: 334 10*3/uL (ref 150–400)
RBC: 4.07 MIL/uL — ABNORMAL LOW (ref 4.22–5.81)
RDW: 17.2 % — ABNORMAL HIGH (ref 11.5–15.5)
WBC: 11.7 10*3/uL — ABNORMAL HIGH (ref 4.0–10.5)
nRBC: 0 % (ref 0.0–0.2)

## 2018-07-25 LAB — BASIC METABOLIC PANEL
Anion gap: 9 (ref 5–15)
BUN: 11 mg/dL (ref 8–23)
CO2: 32 mmol/L (ref 22–32)
Calcium: 9.3 mg/dL (ref 8.9–10.3)
Chloride: 96 mmol/L — ABNORMAL LOW (ref 98–111)
Creatinine, Ser: 0.71 mg/dL (ref 0.61–1.24)
GFR calc Af Amer: >60 mL/min (ref >60)
GFR calc non Af Amer: >60 mL/min (ref >60)
Glucose, Bld: 189 mg/dL — ABNORMAL HIGH (ref 70–99)
Potassium: 3.8 mmol/L (ref 3.5–5.1)
Sodium: 137 mmol/L (ref 135–145)

## 2018-07-25 MED ORDER — CEPHALEXIN 500 MG PO CAPS: 500.0000 mg | ORAL_CAPSULE | Freq: Three times a day (TID) | ORAL | 0 refills | Status: AC

## 2018-07-25 NOTE — ED Notes (Signed)
Patient transported to facility by [facility]. Unable to sign due to underlying conditions. Patient stable upon discharge. Facility called for report.

## 2018-07-25 NOTE — ED Notes (Signed)
[facility] contacted for transport. Paperwork printed and at bedside.

## 2018-07-25 NOTE — ED Notes (Signed)
Lovena Le, RN and this NT attempted to in and out cath pt but unable to do so successfully. Bladder scanned pt with 48mL shown in bladder. Will notify provider.

## 2018-07-25 NOTE — ED Notes (Signed)
Pt transported to xray 

## 2018-07-25 NOTE — Discharge Instructions (Signed)
Your work-up today is largely unremarkable.  You do have evidence of likely UTI.

## 2018-07-27 LAB — URINE CULTURE: Culture: >=100000 — AB

## 2018-07-28 ENCOUNTER — Telehealth: Payer: Self-pay | Admitting: *Deleted

## 2018-07-28 NOTE — Telephone Encounter (Signed)
Post ED Visit - Positive Culture Follow-up  Culture report reviewed by antimicrobial stewardship pharmacist: [place] Team []  Elenor Quinones, Pharm.D. []  Heide Guile, Pharm.D., BCPS AQ-ID []  Parks Neptune, Pharm.D., BCPS []  Alycia Rossetti, Pharm.D., BCPS []  [place], [place].D., BCPS, AAHIVP []  Legrand Como, Pharm.D., BCPS, AAHIVP []  Salome Arnt, PharmD, BCPS []  Johnnette Gourd, PharmD, BCPS []  Hughes Better, PharmD, BCPS []  Leeroy Cha, PharmD []  Laqueta Linden, PharmD, BCPS []  Albertina Parr, PharmD  [place] Team []  Leodis Sias, PharmD []  Lindell Spar, PharmD []  Royetta Asal, PharmD []  Graylin Shiver, Rph []  Rema Fendt) Glennon Mac, PharmD []  Arlyn Dunning, PharmD []  Netta Cedars, PharmD []  Dia Sitter, PharmD []  Leone Haven, PharmD []  Gretta Arab, PharmD [x]  Theodis Shove, PharmD []  Peggyann Juba, PharmD []  Reuel Boom, PharmD   Positive urine culture Treated with Cephalexin, organism sensitive to the same and no further patient follow-up is required at this time.  Harlon Flor [facility] 07/28/2018, 11:21 AM

## 2018-11-08 ENCOUNTER — Emergency Department (HOSPITAL_COMMUNITY): Payer: Medicaid Other

## 2018-11-08 ENCOUNTER — Encounter (HOSPITAL_COMMUNITY): Payer: Self-pay

## 2018-11-08 ENCOUNTER — Emergency Department (HOSPITAL_COMMUNITY)
Admission: EM | Admit: 2018-11-08 | Discharge: 2018-11-08 | Disposition: A | Discharge status: Home / Self Care | Payer: Medicaid Other | Attending: Emergency Medicine | Admitting: Emergency Medicine

## 2018-11-08 ENCOUNTER — Other Ambulatory Visit: Payer: Self-pay

## 2018-11-08 DIAGNOSIS — Z79899 Other long term (current) drug therapy: Secondary | ICD-10-CM | POA: Insufficient documentation

## 2018-11-08 DIAGNOSIS — I509 Heart failure, unspecified: Secondary | ICD-10-CM | POA: Diagnosis not present

## 2018-11-08 DIAGNOSIS — I110 Hypertensive heart disease with heart failure: Secondary | ICD-10-CM | POA: Diagnosis not present

## 2018-11-08 DIAGNOSIS — R739 Hyperglycemia, unspecified: Secondary | ICD-10-CM

## 2018-11-08 DIAGNOSIS — M549 Dorsalgia, unspecified: Secondary | ICD-10-CM | POA: Insufficient documentation

## 2018-11-08 DIAGNOSIS — J449 Chronic obstructive pulmonary disease, unspecified: Secondary | ICD-10-CM | POA: Diagnosis not present

## 2018-11-08 DIAGNOSIS — Z89511 Acquired absence of right leg below knee: Secondary | ICD-10-CM | POA: Diagnosis not present

## 2018-11-08 DIAGNOSIS — R05 Cough: Secondary | ICD-10-CM | POA: Insufficient documentation

## 2018-11-08 DIAGNOSIS — Y939 Activity, unspecified: Secondary | ICD-10-CM | POA: Insufficient documentation

## 2018-11-08 DIAGNOSIS — W050XXA Fall from non-moving wheelchair, initial encounter: Secondary | ICD-10-CM | POA: Insufficient documentation

## 2018-11-08 DIAGNOSIS — F17210 Nicotine dependence, cigarettes, uncomplicated: Secondary | ICD-10-CM | POA: Diagnosis not present

## 2018-11-08 DIAGNOSIS — Y999 Unspecified external cause status: Secondary | ICD-10-CM | POA: Insufficient documentation

## 2018-11-08 DIAGNOSIS — Y92129 Unspecified place in nursing home as the place of occurrence of the external cause: Secondary | ICD-10-CM | POA: Diagnosis not present

## 2018-11-08 DIAGNOSIS — W19XXXA Unspecified fall, initial encounter: Secondary | ICD-10-CM

## 2018-11-08 DIAGNOSIS — E1165 Type 2 diabetes mellitus with hyperglycemia: Secondary | ICD-10-CM | POA: Diagnosis not present

## 2018-11-08 DIAGNOSIS — F0390 Unspecified dementia without behavioral disturbance: Secondary | ICD-10-CM | POA: Diagnosis not present

## 2018-11-08 DIAGNOSIS — M542 Cervicalgia: Secondary | ICD-10-CM | POA: Insufficient documentation

## 2018-11-08 DIAGNOSIS — C7931 Secondary malignant neoplasm of brain: Secondary | ICD-10-CM | POA: Insufficient documentation

## 2018-11-08 LAB — BASIC METABOLIC PANEL
Anion gap: 15 (ref 5–15)
BUN: 20 mg/dL (ref 8–23)
CO2: 28 mmol/L (ref 22–32)
Calcium: 9.7 mg/dL (ref 8.9–10.3)
Chloride: 90 mmol/L — ABNORMAL LOW (ref 98–111)
Creatinine, Ser: 0.69 mg/dL (ref 0.61–1.24)
GFR calc Af Amer: >60 mL/min (ref >60)
GFR calc non Af Amer: >60 mL/min (ref >60)
Glucose, Bld: 342 mg/dL — ABNORMAL HIGH (ref 70–99)
Potassium: 4.5 mmol/L (ref 3.5–5.1)
Sodium: 133 mmol/L — ABNORMAL LOW (ref 135–145)

## 2018-11-08 LAB — CBC
HCT: 38.6 % — ABNORMAL LOW (ref 39.0–52.0)
Hemoglobin: 11.2 g/dL — ABNORMAL LOW (ref 13.0–17.0)
MCH: 25.1 pg — ABNORMAL LOW (ref 26.0–34.0)
MCHC: 29 g/dL — ABNORMAL LOW (ref 30.0–36.0)
MCV: 86.5 fL (ref 80.0–100.0)
Platelets: 353 10*3/uL (ref 150–400)
RBC: 4.46 MIL/uL (ref 4.22–5.81)
RDW: 15.6 % — ABNORMAL HIGH (ref 11.5–15.5)
WBC: 16.4 10*3/uL — ABNORMAL HIGH (ref 4.0–10.5)
nRBC: 0 % (ref 0.0–0.2)

## 2018-11-08 MED ORDER — FUROSEMIDE 20 MG PO TABS: 20.0000 mg | ORAL_TABLET | Freq: Every day | ORAL | 0 refills | Status: AC

## 2018-11-08 NOTE — ED Notes (Signed)
[facility] called. Attempted to call report to [facility], no answer on phone.

## 2018-11-08 NOTE — ED Triage Notes (Signed)
Pt BIB EMS from [facility]. Staff reports pt fell forward from wheelchair onto ground, pt wheelchair bound. Facility reports pt hit head, did not have LOC. Pt has hx of dementia and diabetes. Pt has RBKA.   121/64  CBG 423

## 2018-11-08 NOTE — Discharge Instructions (Signed)
Take the diuretic to help with some of the congestion noted on chest x-ray.  The CT scan did show as expected increasing size of the brain lesions with some swelling.

## 2018-11-08 NOTE — ED Notes (Signed)
[facility] here for transportation home

## 2018-11-08 NOTE — ED Provider Notes (Signed)
[facility] DEPT Provider Note   CSN: [id number] Arrival date & time: 11/08/18  1517     History   Chief Complaint Chief Complaint  Patient presents with   Fall    HPI Craig Bentley is a 63 y.o. male.     HPI Patient presents to the ED for evaluation after a fall.  Patient has a history of metastatic cancer to the brain.  According to the notes back in April they felt he most likely had metastatic lung cancer.  Plan was for palliative care.  Patient resides in a nursing facility.  Patient is currently wheelchair-bound.  According to staff at this facility patient fell forward from his wheelchair onto the ground.  According to the staff at the facility the patient did hit his head but did not have any loss of consciousness.  Patient complains of pain in his neck as well as his backside.  He admits to some coughing recently.  Denies any vomiting or diarrhea.  No fevers or chills. 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   Low grade fever    Surgical wound dehiscence    Opacity of lung on imaging study    Acute blood loss anemia    Fever    SIRS (systemic inflammatory response syndrome) (HCC)    Leukocytosis    Status post below knee amputation of right lower extremity ([place]) 08/28/2015   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[place]) 08/23/2015   Sepsis ([place]) 08/21/2015   ARF  (acute renal failure) ([place]) 08/21/2015   Acute encephalopathy 08/21/2015   Post-operative state    Respiratory failure ([place])    Surgery, elective    Anxiety    Paranoid schizophrenia ([place])    Atherosclerosis of extremity with gangrene ([place])    Hyperglycemia    Preoperative cardiovascular examination 04/11/2015   PVD (peripheral vascular disease) ([place]) 04/10/2015   Absolute anemia    Diabetic foot infection ([place]) 04/02/2015   Right foot pain 28/41/3244   Neutrophilic leukocytosis 02/24/7251   Esophageal abnormality    HCAP (healthcare-associated pneumonia)    COPD exacerbation (HCC)    Dyspnea    Respiratory distress    Sepsis due to pneumonia ([place]) 02/14/2015   Acute respiratory failure with hypoxia ([place]) 02/13/2015   Diabetes mellitus with neurologic complication, without long-term current use of insulin ([place]) 07/20/2011   Essential hypertension    COPD (chronic obstructive pulmonary disease) ([place])     Past Surgical History:  Procedure Laterality Date   AMPUTATION Right 04/23/2015   Procedure: Right Fifth Toe Amputation;  Surgeon: Conrad , MD;  Location: [place];  Service: Vascular;  Laterality: Right;   AMPUTATION Right 08/27/2015   Procedure: RIGHT BELOW KNEE AMPUTATION ;  Surgeon: Newt Minion, MD;  Location: [place];  Service: Orthopedics;  Laterality: Right;   AORTA - BILATERAL FEMORAL ARTERY BYPASS  GRAFT Bilateral 04/23/2015   Procedure: AORTOBIFEMORAL BYPASS GRAFT;  Surgeon: Conrad Montpelier, MD;  Location: [place];  Service: Vascular;  Laterality: Bilateral;   FEMORAL-FEMORAL BYPASS GRAFT Left 04/23/2015   Procedure:  Left Common Femoral Artery to Superficial Femoral Artery Bypass Graft with Vein;  Surgeon: Conrad Spade, MD;  Location: [facility];  Service: Vascular;  Laterality: Left;   FEMORAL-TIBIAL BYPASS GRAFT Right 04/23/2015   Procedure: Right Common Femoral to Posterior Tibial Bypass with Composite Vein Graft. ;  Surgeon: Conrad [place]loo, MD;  Location:  [facility];  Service: Vascular;  Laterality: Right;   PERIPHERAL VASCULAR CATHETERIZATION N/A 04/07/2015   Procedure: Abdominal Aortogram;  Surgeon: Angelia Mould, MD;  Location: [facility] CV LAB;  Service: Cardiovascular;  Laterality: N/A;        Home Medications    Prior to Admission medications   Medication Sig Start Date End Date Taking? Authorizing Provider  cephALEXin (KEFLEX) 500 MG capsule Take 1 capsule (500 mg total) by mouth 3 (three) times daily. 07/25/18   Horton, Barbette Hair, MD  clonazePAM (KLONOPIN) 1 MG tablet Take 1 tablet (1 mg total) by mouth 3 (three) times daily. 06/20/18   Lavina Hamman, MD  cloZAPine (CLOZARIL) 100 MG tablet Take 1-4 tablets (100-400 mg total) by mouth 3 (three) times daily. Take 1 tablet (100 mg) by mouth daily at 8am and 4pm, take 4 tablets (400 mg) at bedtime Patient taking differently: Take 200 mg by mouth 2 (two) times daily.  08/28/15   Rai, Ripudeep K, MD  dexamethasone (DECADRON) 4 MG tablet Take 1 tablet (4 mg total) by mouth 2 (two) times daily. 06/20/18   Lavina Hamman, MD  donepezil (ARICEPT) 10 MG tablet Take 10 mg by mouth at bedtime.    [provider]  furosemide (LASIX) 20 MG tablet Take 1 tablet (20 mg total) by mouth daily for 7 days. 11/08/18 11/15/18  Dorie Rank, MD  gabapentin (NEURONTIN) 300 MG capsule Take 300 mg by mouth 3 (three) times daily.    [provider]  haloperidol (HALDOL) 5 MG tablet Take 1 tablet (5 mg total) by mouth at bedtime. Patient taking differently: Take 5 mg by mouth 2 (two) times daily.  08/28/15   Rai, Ripudeep K, MD  lactulose, encephalopathy, (ENULOSE) 10 GM/15ML SOLN Take 20 g by mouth 2 (two) times daily.    [provider]  lisinopril (PRINIVIL,ZESTRIL) 5 MG tablet Take 5 mg by mouth daily.    [provider]  metoprolol tartrate (LOPRESSOR) 25 MG tablet Take 25 mg by mouth 2 (two) times daily.    [provider]  pantoprazole (PROTONIX) 40 MG tablet Take  1 tablet (40 mg total) by mouth daily. 02/20/15   Theodis Blaze, MD  senna-docusate (SENOKOT-S) 8.6-50 MG tablet Take 2 tablets by mouth at bedtime. 09/09/15   Love, Ivan Anchors, PA-C  trihexyphenidyl (ARTANE) 5 MG tablet Take 5 mg by mouth 2 (two) times daily.     [provider]    Family History Family History  Problem Relation Age of Onset   Mental illness Mother     Social History Social History   Tobacco Use   Smoking status: Current Every Day Smoker    Packs/day: 1.50    Years: 50.00    Pack years: 75.00    Types: Cigarettes   Smokeless tobacco: Never Used  Substance Use Topics   Alcohol use: No    Alcohol/week: 0.0 standard drinks  Drug use: No     Allergies   Patient has no known allergies.   Review of Systems Review of Systems   Physical Exam Updated Vital Signs BP 119/64    Pulse (!) 101    Temp 97.8 F (36.6 C) (Oral)    Resp 18    SpO2 93%   Physical Exam Vitals signs and nursing note reviewed.  Constitutional:      Appearance: He is well-developed. He is not toxic-appearing or diaphoretic.     Comments: Chronically ill-appearing, listless  HENT:     Head: Normocephalic and atraumatic.     Right Ear: External ear normal.     Left Ear: External ear normal.  Eyes:     General: No scleral icterus.       Right eye: No discharge.        Left eye: No discharge.     Conjunctiva/sclera: Conjunctivae normal.  Neck:     Musculoskeletal: Neck supple.     Trachea: No tracheal deviation.  Cardiovascular:     Rate and Rhythm: Normal rate and regular rhythm.  Pulmonary:     Effort: Pulmonary effort is normal. No respiratory distress.     Breath sounds: No stridor. Rhonchi present. No wheezing or rales.     Comments: Occasional cough Abdominal:     General: Bowel sounds are normal. There is no distension.     Palpations: Abdomen is soft.     Tenderness: There is no abdominal tenderness. There is no guarding or rebound.  Musculoskeletal:      Right shoulder: Normal.     Left shoulder: Normal.     Right hip: Normal.     Left hip: Normal.     Cervical back: He exhibits tenderness.     Thoracic back: Normal.     Lumbar back: Normal.  Skin:    General: Skin is warm and dry.     Findings: No rash.  Neurological:     Mental Status: He is alert.     Cranial Nerves: No cranial nerve deficit (no facial droop, extraocular movements intact, no slurred speech).     Sensory: No sensory deficit.     Motor: No abnormal muscle tone or seizure activity.     Coordination: Coordination normal.      ED Treatments / Results  Labs (all labs ordered are listed, but only abnormal results are displayed) Labs Reviewed  CBC - Abnormal; Notable for the following components:      Result Value   WBC 16.4 (*)    Hemoglobin 11.2 (*)    HCT 38.6 (*)    MCH 25.1 (*)    MCHC 29.0 (*)    RDW 15.6 (*)    All other components within normal limits  BASIC METABOLIC PANEL - Abnormal; Notable for the following components:   Sodium 133 (*)    Chloride 90 (*)    Glucose, Bld 342 (*)    All other components within normal limits    EKG None  Radiology Dg Chest 2 View  Result Date: 11/08/2018 CLINICAL DATA:  63 year old male status post fall from wheelchair onto ground EXAM: CHEST - 2 VIEW COMPARISON:  None. FINDINGS: Mild cardiomegaly, unchanged. Slightly increased pulmonary vascular congestion bordering on mild edema. Probable small left layering pleural effusion and associated atelectasis. No pneumothorax. No acute osseous abnormality. IMPRESSION: Cardiomegaly with pulmonary vascular congestion and probable mild interstitial edema consistent with early CHF. Electronically Signed   By: Dellis Filbert.D.  On: 11/08/2018 18:12   Dg Lumbar Spine Complete  Result Date: 11/08/2018 CLINICAL DATA:  Fall with back pain EXAM: LUMBAR SPINE - COMPLETE 4+ VIEW COMPARISON:  07/25/2018 FINDINGS: Extensive stool throughout the colon with slight gaseous  prominence of central bowel. Stool and bowel gas slightly limited evaluation of osseous structures. Mild superior endplate deformity at L4, chronic to July 25, 2018. Moderate compression deformity of L5 with increased vertebral density, loss of height is new as compared with 07/25/2018, sclerosis suggests possible subacute process. IMPRESSION: 1. Moderate compression deformity of L5, new since 07/25/2018, suggestion of sclerosis in the vertebral body as may be seen with subacute injury. Diffuse loss of vertebral body height estimated at 30%. 2. Chronic superior endplate deformity at L4 3. Large stool burden suggesting constipation Electronically Signed   By: Donavan Foil M.D.   On: 11/08/2018 18:13   Ct Head Wo Contrast  Result Date: 11/08/2018 CLINICAL DATA:  History of diabetes and dementia. Presumed metastatic lung cancer. EXAM: CT HEAD WITHOUT CONTRAST CT CERVICAL SPINE WITHOUT CONTRAST TECHNIQUE: Multidetector CT imaging of the head and cervical spine was performed following the standard protocol without intravenous contrast. Multiplanar CT image reconstructions of the cervical spine were also generated. COMPARISON:  06/16/2018 FINDINGS: CT HEAD FINDINGS Brain: No evidence of acute infarction or hemorrhage. Previous left inferior frontal lobe mass now measures 3 cm, image 14/3. Previously this measured 0.5 cm. Associated development of marked surrounding edema is identified with mass effect on the adjacent structures and mild para falcine herniation. Second lesion within the left temporal lobe measures 4 cm, image 15/3. Previously 2.7 cm. There is progressive surrounding edema involving the left temporal lobe, left parietal lobe and left occipital lobe. Left right midline shift measures 5 mm, image 18/3. No hydrocephalus. Vascular: No hyperdense vessel or unexpected calcification. Skull: Normal. Negative for fracture or focal lesion. Sinuses/Orbits: No acute finding. Other: None CT CERVICAL SPINE FINDINGS  Alignment: Normal. Skull base and vertebrae: No acute fracture. No primary bone lesion or focal pathologic process. Soft tissues and spinal canal: No prevertebral fluid or swelling. No visible canal hematoma. Disc levels: Mild multi level disc space narrowing and ventral endplate spurring. Upper chest: Negative. Other: None IMPRESSION: 1. There are 2 metastatic lesions identified within the left frontal lobe and left temporal lobe which demonstrates interval increase in size with associated progression of surrounding edema with new left right midline shift. 2. No evidence for acute brain infarct, intracranial hemorrhage. 3. No evidence for cervical spine fracture. Electronically Signed   By: Kerby Moors M.D.   On: 11/08/2018 18:33   Ct Cervical Spine Wo Contrast  Result Date: 11/08/2018 CLINICAL DATA:  History of diabetes and dementia. Presumed metastatic lung cancer. EXAM: CT HEAD WITHOUT CONTRAST CT CERVICAL SPINE WITHOUT CONTRAST TECHNIQUE: Multidetector CT imaging of the head and cervical spine was performed following the standard protocol without intravenous contrast. Multiplanar CT image reconstructions of the cervical spine were also generated. COMPARISON:  06/16/2018 FINDINGS: CT HEAD FINDINGS Brain: No evidence of acute infarction or hemorrhage. Previous left inferior frontal lobe mass now measures 3 cm, image 14/3. Previously this measured 0.5 cm. Associated development of marked surrounding edema is identified with mass effect on the adjacent structures and mild para falcine herniation. Second lesion within the left temporal lobe measures 4 cm, image 15/3. Previously 2.7 cm. There is progressive surrounding edema involving the left temporal lobe, left parietal lobe and left occipital lobe. Left right midline shift measures 5 mm, image 18/3.  No hydrocephalus. Vascular: No hyperdense vessel or unexpected calcification. Skull: Normal. Negative for fracture or focal lesion. Sinuses/Orbits: No acute  finding. Other: None CT CERVICAL SPINE FINDINGS Alignment: Normal. Skull base and vertebrae: No acute fracture. No primary bone lesion or focal pathologic process. Soft tissues and spinal canal: No prevertebral fluid or swelling. No visible canal hematoma. Disc levels: Mild multi level disc space narrowing and ventral endplate spurring. Upper chest: Negative. Other: None IMPRESSION: 1. There are 2 metastatic lesions identified within the left frontal lobe and left temporal lobe which demonstrates interval increase in size with associated progression of surrounding edema with new left right midline shift. 2. No evidence for acute brain infarct, intracranial hemorrhage. 3. No evidence for cervical spine fracture. Electronically Signed   By: Kerby Moors M.D.   On: 11/08/2018 18:33   Dg Hips Bilat With Pelvis Min 5 Views  Result Date: 11/08/2018 CLINICAL DATA:  Fall EXAM: DG HIP (WITH OR WITHOUT PELVIS) 5+V BILAT COMPARISON:  CT 06/16/2018 FINDINGS: Large stool burden. Clips in the left groin. Sacrum is largely obscured by stool and bowel gas. Vascular calcifications. Pubic symphysis and rami appear intact. Both femoral heads project in joint. No acute displaced fracture. IMPRESSION: No acute osseous abnormality Electronically Signed   By: Donavan Foil M.D.   On: 11/08/2018 18:15    Procedures Procedures (including critical care time)  Medications Ordered in ED Medications - No data to display   Initial Impression / Assessment and Plan / ED Course  I have reviewed the triage vital signs and the nursing notes.  Pertinent labs & imaging results that were available during my care of the patient were reviewed by me and considered in my medical decision making (see chart for details).  Clinical Course as of Nov 07 1908  Wed Nov 08, 2018  1903 X-rays reviewed.  CT scan shows worsening findings associated with his known metastatic disease but no hemorrhage.  No other acute fractures.   [JK]  1904  Chest x-ray suggest possible mild CHF.   [JK]  1908 COntacted pt's sister and notified her of the findings.   [JK]    Clinical Course User Index [JK] Dorie Rank, MD     Patient presented after a fall.  Patient has known metastatic cancer to the brain.  The CT scan does demonstrate increasing size of those lesions with increasing edema.  Per previous notes there was no plan for any intervention.  Patient's x-ray does show possible mild CHF.  Patient also noted to have hyperglycemia.  Considering his comfort care I do not think we need to act aggressively on this.  I will give him lasix to take as an outpatient.  Consider follow up on the hyperglycemia with PCP  Family was contacted and updated on findings Final Clinical Impressions(s) / ED Diagnoses   Final diagnoses:  Metastatic cancer to brain [facility])  Fall, initial encounter  Congestive heart failure, unspecified HF chronicity, unspecified heart failure type [facility])    ED Discharge Orders         Ordered    furosemide (LASIX) 20 MG tablet  Daily     11/08/18 1909           Dorie Rank, MD 11/08/18 1913

## 2018-11-23 DEATH — deceased

## 2020-01-03 ENCOUNTER — Telehealth: Payer: Self-pay | Admitting: Nurse Practitioner

## 2020-01-03 NOTE — Telephone Encounter (Signed)
Spoke with patient's sister and she requested that I speak with her daughter, Craig Bentley, regarding this.  She said that patient wanted to get a Living Will completed and wanted Craig Bentley on the papers.  Sister gave me Craig Bentley's cell number to call her and schedule visit.    3:20 PM:  Spoke with Craig Bentley and after discussing Palliative referral/services with her, she and patient were in agreement (she was with patient at the time of the call).  Craig Bentley has requested a visit around 10 AM, I told her that I would need to get with NP to see when we could get him scheduled and that I call her back after talking with NP and she was in agreement with this

## 2020-01-10 ENCOUNTER — Telehealth: Payer: Self-pay | Admitting: Nurse Practitioner

## 2020-01-10 NOTE — Telephone Encounter (Signed)
Documented on wrong patient.

## 2020-01-10 NOTE — Telephone Encounter (Signed)
Documented on wrong patient

## 2020-01-10 NOTE — Telephone Encounter (Signed)
Spoke with granddaughter, Parthenia Ames, and have scheduled an In-home Palliative Consult for 12/2919 @ 1 PM.

## 2020-01-10 NOTE — Telephone Encounter (Signed)
Called Shanelle (granddaughter) back to schedule Palliative Consult, no answer - and unable to leave a message due to voicemail not set up

## 2020-01-21 ENCOUNTER — Other Ambulatory Visit: Payer: Self-pay | Admitting: Nurse Practitioner
